# Patient Record
Sex: Female | Born: 1955 | Race: White | Hispanic: No | Marital: Married | Smoking: Former smoker
Health system: Southern US, Community
[De-identification: ages and names within clinical notes are randomized; demographics above are authoritative.]

## PROBLEM LIST (undated history)

## (undated) DIAGNOSIS — R5383 Other fatigue: Secondary | ICD-10-CM

## (undated) DIAGNOSIS — I499 Cardiac arrhythmia, unspecified: Secondary | ICD-10-CM

## (undated) DIAGNOSIS — G4733 Obstructive sleep apnea (adult) (pediatric): Secondary | ICD-10-CM

## (undated) DIAGNOSIS — M1990 Unspecified osteoarthritis, unspecified site: Secondary | ICD-10-CM

## (undated) DIAGNOSIS — M8580 Other specified disorders of bone density and structure, unspecified site: Secondary | ICD-10-CM

## (undated) DIAGNOSIS — J449 Chronic obstructive pulmonary disease, unspecified: Secondary | ICD-10-CM

## (undated) DIAGNOSIS — Z803 Family history of malignant neoplasm of breast: Secondary | ICD-10-CM

## (undated) DIAGNOSIS — Z8051 Family history of malignant neoplasm of kidney: Secondary | ICD-10-CM

## (undated) DIAGNOSIS — I700 Atherosclerosis of aorta: Secondary | ICD-10-CM

## (undated) DIAGNOSIS — F419 Anxiety disorder, unspecified: Secondary | ICD-10-CM

## (undated) DIAGNOSIS — K922 Gastrointestinal hemorrhage, unspecified: Secondary | ICD-10-CM

## (undated) DIAGNOSIS — E785 Hyperlipidemia, unspecified: Secondary | ICD-10-CM

## (undated) DIAGNOSIS — R0989 Other specified symptoms and signs involving the circulatory and respiratory systems: Secondary | ICD-10-CM

## (undated) DIAGNOSIS — I10 Essential (primary) hypertension: Secondary | ICD-10-CM

## (undated) DIAGNOSIS — Z801 Family history of malignant neoplasm of trachea, bronchus and lung: Secondary | ICD-10-CM

## (undated) DIAGNOSIS — Z7901 Long term (current) use of anticoagulants: Secondary | ICD-10-CM

## (undated) DIAGNOSIS — M79606 Pain in leg, unspecified: Secondary | ICD-10-CM

## (undated) DIAGNOSIS — K219 Gastro-esophageal reflux disease without esophagitis: Secondary | ICD-10-CM

## (undated) DIAGNOSIS — M79602 Pain in left arm: Secondary | ICD-10-CM

## (undated) DIAGNOSIS — Z720 Tobacco use: Secondary | ICD-10-CM

## (undated) DIAGNOSIS — S72001A Fracture of unspecified part of neck of right femur, initial encounter for closed fracture: Secondary | ICD-10-CM

## (undated) DIAGNOSIS — K759 Inflammatory liver disease, unspecified: Secondary | ICD-10-CM

## (undated) DIAGNOSIS — G478 Other sleep disorders: Secondary | ICD-10-CM

## (undated) DIAGNOSIS — Z808 Family history of malignant neoplasm of other organs or systems: Secondary | ICD-10-CM

## (undated) DIAGNOSIS — Z9981 Dependence on supplemental oxygen: Secondary | ICD-10-CM

## (undated) HISTORY — DX: Family history of malignant neoplasm of other organs or systems: Z80.8

## (undated) HISTORY — DX: Other fatigue: R53.83

## (undated) HISTORY — DX: Gastrointestinal hemorrhage, unspecified: K92.2

## (undated) HISTORY — DX: Other sleep disorders: G47.8

## (undated) HISTORY — DX: Pain in leg, unspecified: M79.606

## (undated) HISTORY — DX: Family history of malignant neoplasm of breast: Z80.3

## (undated) HISTORY — PX: CHOLECYSTECTOMY: SHX55

## (undated) HISTORY — DX: Family history of malignant neoplasm of trachea, bronchus and lung: Z80.1

## (undated) HISTORY — DX: Fracture of unspecified part of neck of right femur, initial encounter for closed fracture: S72.001A

## (undated) HISTORY — DX: Hyperlipidemia, unspecified: E78.5

## (undated) HISTORY — DX: Inflammatory liver disease, unspecified: K75.9

## (undated) HISTORY — DX: Anxiety disorder, unspecified: F41.9

## (undated) HISTORY — DX: Tobacco use: Z72.0

## (undated) HISTORY — PX: OTHER SURGICAL HISTORY: SHX169

## (undated) HISTORY — DX: Other specified disorders of bone density and structure, unspecified site: M85.80

## (undated) HISTORY — DX: Other specified symptoms and signs involving the circulatory and respiratory systems: R09.89

## (undated) HISTORY — DX: Pain in left arm: M79.602

## (undated) HISTORY — DX: Family history of malignant neoplasm of kidney: Z80.51

---
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
## 2000-04-15 ENCOUNTER — Emergency Department (HOSPITAL_COMMUNITY): Admission: EM | Admit: 2000-04-15 | Discharge: 2000-04-15 | Payer: Self-pay | Admitting: Emergency Medicine

## 2002-12-28 ENCOUNTER — Emergency Department (HOSPITAL_COMMUNITY): Admission: EM | Admit: 2002-12-28 | Discharge: 2002-12-29 | Payer: Self-pay | Admitting: Emergency Medicine

## 2004-04-23 ENCOUNTER — Other Ambulatory Visit: Admission: RE | Admit: 2004-04-23 | Discharge: 2004-04-23 | Payer: Self-pay | Admitting: Obstetrics & Gynecology

## 2004-10-10 ENCOUNTER — Encounter: Admission: RE | Admit: 2004-10-10 | Discharge: 2004-10-10 | Payer: Self-pay | Admitting: General Surgery

## 2004-10-10 ENCOUNTER — Encounter (INDEPENDENT_AMBULATORY_CARE_PROVIDER_SITE_OTHER): Payer: Self-pay | Admitting: *Deleted

## 2005-08-18 HISTORY — PX: MM BREAST STEREO BX*L*R/S: HXRAD495

## 2005-11-16 ENCOUNTER — Emergency Department (HOSPITAL_COMMUNITY): Admission: EM | Admit: 2005-11-16 | Discharge: 2005-11-16 | Payer: Self-pay | Admitting: Emergency Medicine

## 2006-08-18 HISTORY — PX: COLONOSCOPY: SHX174

## 2006-10-17 LAB — CONVERTED CEMR LAB: Pap Smear: NORMAL

## 2006-12-17 LAB — HM COLONOSCOPY: HM Colonoscopy: NORMAL

## 2007-12-02 ENCOUNTER — Ambulatory Visit: Payer: Self-pay | Admitting: Family Medicine

## 2007-12-02 DIAGNOSIS — R5383 Other fatigue: Secondary | ICD-10-CM

## 2007-12-02 DIAGNOSIS — G478 Other sleep disorders: Secondary | ICD-10-CM | POA: Insufficient documentation

## 2007-12-02 DIAGNOSIS — F17200 Nicotine dependence, unspecified, uncomplicated: Secondary | ICD-10-CM | POA: Insufficient documentation

## 2007-12-02 DIAGNOSIS — K5909 Other constipation: Secondary | ICD-10-CM | POA: Insufficient documentation

## 2007-12-02 DIAGNOSIS — E785 Hyperlipidemia, unspecified: Secondary | ICD-10-CM | POA: Insufficient documentation

## 2007-12-02 DIAGNOSIS — F411 Generalized anxiety disorder: Secondary | ICD-10-CM | POA: Insufficient documentation

## 2007-12-02 DIAGNOSIS — R5381 Other malaise: Secondary | ICD-10-CM | POA: Insufficient documentation

## 2007-12-03 LAB — CONVERTED CEMR LAB
AST: 17 units/L (ref 0–37)
Alkaline Phosphatase: 74 units/L (ref 39–117)
Basophils Absolute: 0 10*3/uL (ref 0.0–0.1)
Chloride: 106 meq/L (ref 96–112)
Cholesterol: 199 mg/dL (ref 0–200)
Eosinophils Absolute: 0.3 10*3/uL (ref 0.0–0.7)
GFR calc non Af Amer: 93 mL/min
HDL: 35.1 mg/dL — ABNORMAL LOW (ref >39.0)
MCHC: 34.5 g/dL (ref 30.0–36.0)
MCV: 92.9 fL (ref 78.0–100.0)
Neutrophils Relative %: 55.9 % (ref 43.0–77.0)
Platelets: 330 10*3/uL (ref 150–400)
Potassium: 4.2 meq/L (ref 3.5–5.1)
RDW: 13.4 % (ref 11.5–14.6)
Sodium: 142 meq/L (ref 135–145)
Total Bilirubin: 0.6 mg/dL (ref 0.3–1.2)
VLDL: 38 mg/dL (ref 0–40)
WBC: 7.5 10*3/uL (ref 4.5–10.5)

## 2007-12-17 HISTORY — PX: OTHER SURGICAL HISTORY: SHX169

## 2008-01-05 ENCOUNTER — Ambulatory Visit: Payer: Self-pay | Admitting: Family Medicine

## 2008-01-05 DIAGNOSIS — R0989 Other specified symptoms and signs involving the circulatory and respiratory systems: Secondary | ICD-10-CM | POA: Insufficient documentation

## 2008-01-19 ENCOUNTER — Ambulatory Visit: Payer: Self-pay | Admitting: Internal Medicine

## 2008-01-19 ENCOUNTER — Encounter: Payer: Self-pay | Admitting: Family Medicine

## 2008-04-19 ENCOUNTER — Ambulatory Visit: Payer: Self-pay | Admitting: Family Medicine

## 2008-04-22 LAB — CONVERTED CEMR LAB
ALT: 14 units/L (ref 0–35)
AST: 19 units/L (ref 0–37)
HDL: 35.3 mg/dL — ABNORMAL LOW (ref >39.0)
Triglycerides: 177 mg/dL — ABNORMAL HIGH (ref 0–149)
VLDL: 35 mg/dL (ref 0–40)

## 2008-06-20 ENCOUNTER — Telehealth: Payer: Self-pay | Admitting: Family Medicine

## 2008-09-18 HISTORY — PX: OTHER SURGICAL HISTORY: SHX169

## 2008-09-21 ENCOUNTER — Encounter: Payer: Self-pay | Admitting: Family Medicine

## 2008-09-21 ENCOUNTER — Ambulatory Visit: Payer: Self-pay | Admitting: Family Medicine

## 2008-10-26 ENCOUNTER — Encounter: Admission: RE | Admit: 2008-10-26 | Discharge: 2008-10-26 | Payer: Self-pay | Admitting: Orthopedic Surgery

## 2008-11-14 ENCOUNTER — Telehealth: Payer: Self-pay | Admitting: Family Medicine

## 2009-02-05 ENCOUNTER — Encounter: Payer: Self-pay | Admitting: Family Medicine

## 2009-03-09 ENCOUNTER — Ambulatory Visit (HOSPITAL_BASED_OUTPATIENT_CLINIC_OR_DEPARTMENT_OTHER): Admission: RE | Admit: 2009-03-09 | Discharge: 2009-03-09 | Payer: Self-pay | Admitting: Orthopedic Surgery

## 2009-03-29 ENCOUNTER — Encounter: Payer: Self-pay | Admitting: Family Medicine

## 2009-06-12 ENCOUNTER — Telehealth: Payer: Self-pay | Admitting: Family Medicine

## 2009-07-31 ENCOUNTER — Encounter: Payer: Self-pay | Admitting: Family Medicine

## 2009-08-08 ENCOUNTER — Encounter: Payer: Self-pay | Admitting: Family Medicine

## 2009-09-05 ENCOUNTER — Encounter: Payer: Self-pay | Admitting: Family Medicine

## 2009-12-10 ENCOUNTER — Telehealth: Payer: Self-pay | Admitting: Family Medicine

## 2010-01-18 ENCOUNTER — Encounter: Payer: Self-pay | Admitting: Family Medicine

## 2010-04-16 ENCOUNTER — Encounter: Payer: Self-pay | Admitting: Family Medicine

## 2010-09-17 NOTE — Letter (Signed)
Summary: [facility]  [facility]   Imported By: Lanelle Bal 04/24/2010 14:15:20  _____________________________________________________________________  External Attachment:    Type:   Image     Comment:   External Document

## 2010-09-17 NOTE — Progress Notes (Signed)
Summary: Rx Alprazolam  Phone Note Refill Request Call back at [phone] Message from:  [facility] on December 10, 2009 10:36 AM  Refills Requested: Medication #1:  ALPRAZOLAM 1 MG TABS 1 by mouth at bedtime as needed insomnia.   Last Refilled: 11/09/2009 Received faxed refill request please advise.   Method Requested: Telephone to Pharmacy Initial call taken by: Linde Gillis CMA Duncan Dull),  December 10, 2009 10:36 AM  Follow-up for Phone Call        px written on EMR for call in schedule check up with me this summer if possible   Follow-up by: Judith Part MD,  December 10, 2009 12:50 PM  Additional Follow-up for Phone Call Additional follow up Details #1::        Medication phoned to pharmacy. Patient Advised.  Additional Follow-up by: Delilah Shan CMA Duncan Dull),  December 10, 2009 3:19 PM    New/Updated Medications: ALPRAZOLAM 1 MG TABS (ALPRAZOLAM) 1 by mouth at bedtime as needed insomnia Prescriptions: ALPRAZOLAM 1 MG TABS (ALPRAZOLAM) 1 by mouth at bedtime as needed insomnia  #30 x 5   Entered and Authorized by:   Judith Part MD   Signed by:   Delilah Shan CMA (AAMA) on 12/10/2009   Method used:   Telephoned to ...         RxID:   [id number]

## 2010-09-17 NOTE — Letter (Signed)
Summary: [facility]  [facility]   Imported By: Lanelle Bal 01/29/2010 10:42:36  _____________________________________________________________________  External Attachment:    Type:   Image     Comment:   External Document

## 2010-09-17 NOTE — Letter (Signed)
Summary: [facility]  [facility]   Imported By: Lanelle Bal 09/11/2009 13:02:15  _____________________________________________________________________  External Attachment:    Type:   Image     Comment:   External Document

## 2010-12-15 ENCOUNTER — Emergency Department: Payer: Self-pay | Admitting: Emergency Medicine

## 2010-12-31 NOTE — Op Note (Signed)
Debra [name], Debra [name]                  ACCOUNT NO.:  [id number]   MEDICAL RECORD NO.:  [id number]          PATIENT TYPE:  AMB   LOCATION:  DSC                          FACILITY:  [facility]   PHYSICIAN:  Harvie Junior, M.D.   DATE OF BIRTH:  28-Nov-1955   DATE OF PROCEDURE:  03/09/2009  DATE OF DISCHARGE:                               OPERATIVE REPORT   PREOPERATIVE DIAGNOSES:  1. Impingement.  2. Acromioclavicular joint arthritis.  3. Partial-thickness rotator cuff tear.   POSTOPERATIVE DIAGNOSES:  1. Impingement.  2. Acromioclavicular joint arthritis.  3. Partial-thickness rotator cuff tear.   PRINCIPAL PROCEDURE:  1. Left shoulder arthroscopy with subacromial decompression.  2. Distal clavicle excision.  3. Debridement of undersurface of rotator cuff tear and anterior      labral tear.   SURGEON:  Harvie Junior, MD   ASSISTANT:  Marshia Ly, PA   ANESTHESIA:  General.   BRIEF HISTORY:  This is a 55 year old female with long history of having  had significant left shoulder pain.  She was treated for a prolonged  period of time.  Ultimately, MRI showed that she had a partial thickness  rotator cuff tear.  We felt she had a lot of symptoms coming from her  neck and ultimately she was treated by neurosurgeons who felt that her  symptoms were not coming from her neck and she came back to [place] for the  shoulder.  She improved significantly with an injection because of  continued complaints of pain.  After failure of conservative management,  she is taken to the operating room for operative shoulder arthroscopy.   PROCEDURE:  The patient was taken to the operating room.  After adequate  anesthesia was obtained under general anesthetic.  The patient was  placed supine on the operating room and moved in the beach-chair  position.  All bony prominences were well padded.  Attention was then  turned to the left shoulder.  After routine prep and drape, the  arthroscope was entered into  the left shoulder and there was obvious  anterior labral tear which was debrided.  The rotator cuff had a partial-  thickness undersurface tear.  This was identified by MRI.  This was  debrided thoroughly from the undersurface and marked with a suture.  The  biceps tendon was within normal limits.  Glenohumeral joint, no  arthritis.  Attention was turned out of the glenohumeral joint into the  subacromial space.  A suture was identified and I looked at the rotator  cuff from the top side.  No significant fraying on the top side.  At  this point, I felt the rotator cuff was fine and suture was removed.  Anterolateral acromioplasty was performed for lateral posterior  compartment.  Distal clavicle resected was performed over 18 mm from the  anterior compartment.  A subtotal  bursectomy was then performed and the wound was copiously and thoroughly  irrigated and suctioned dry.  All sterile portals were then closed with  bandage.  Sterile compressive dressing was applied, and the patient was  to recovery  room and noted to be in satisfactory condition.  Estimated  blood loss for procedure was none.      Harvie Junior, M.D.  Electronically Signed     JLG/MEDQ  D:  03/09/2009  T:  03/10/2009  Job:  [id number]

## 2011-02-03 ENCOUNTER — Encounter: Payer: Self-pay | Admitting: Cardiovascular Disease

## 2011-10-24 ENCOUNTER — Encounter: Payer: Self-pay | Admitting: Family Medicine

## 2011-10-24 ENCOUNTER — Telehealth: Payer: Self-pay | Admitting: Family Medicine

## 2011-10-24 ENCOUNTER — Ambulatory Visit (INDEPENDENT_AMBULATORY_CARE_PROVIDER_SITE_OTHER): Payer: Medicare Other | Admitting: Family Medicine

## 2011-10-24 VITALS — BP 120/80 | HR 84 | Temp 98.4°F | Wt 178.0 lb

## 2011-10-24 DIAGNOSIS — J069 Acute upper respiratory infection, unspecified: Secondary | ICD-10-CM

## 2011-10-24 MED ORDER — HYDROCODONE-HOMATROPINE 5-1.5 MG/5ML PO SYRP: 5.0000 mL | ORAL_SOLUTION | Freq: Three times a day (TID) | ORAL | Status: DC | PRN

## 2011-10-24 MED ORDER — HYDROCOD POLST-CHLORPHEN POLST 10-8 MG/5ML PO LQCR: 5.0000 mL | Freq: Every evening | ORAL | Status: DC | PRN
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
## 2011-10-24 NOTE — Telephone Encounter (Signed)
Medicine called to pharmacy. 

## 2011-10-24 NOTE — Patient Instructions (Signed)
Good to see you. This is likely a virus.   Drink lots of fluids.  Treat sympotmatically with Mucinex, nasal saline irrigation, and Tylenol/Ibuprofen.  Cough suppressant at night. Call if not improving as expected in 5-7 days.

## 2011-11-03 ENCOUNTER — Ambulatory Visit (INDEPENDENT_AMBULATORY_CARE_PROVIDER_SITE_OTHER): Payer: Medicare Other | Admitting: Family Medicine

## 2011-11-03 ENCOUNTER — Encounter: Payer: Self-pay | Admitting: Family Medicine

## 2011-11-03 VITALS — BP 120/84 | HR 84 | Temp 98.7°F | Wt 177.0 lb

## 2011-11-03 DIAGNOSIS — J069 Acute upper respiratory infection, unspecified: Secondary | ICD-10-CM

## 2011-11-03 MED ORDER — AZITHROMYCIN 250 MG PO TABS: ORAL_TABLET | ORAL | Status: AC

## 2011-11-03 NOTE — Patient Instructions (Signed)
Good to see you. Take Zpack as directed, continue using mucinex and cough suppressant as needed.
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
## 2011-12-03 ENCOUNTER — Telehealth: Payer: Self-pay

## 2011-12-03 NOTE — Telephone Encounter (Signed)
Aware of that in the past - she has a bruit F/u in fall as planned

## 2011-12-03 NOTE — Telephone Encounter (Signed)
Eunice Blase and Rielle advised as instructed via telephone, CPX scheduled for patient 04/21/2012 at 11:30.

## 2011-12-03 NOTE — Telephone Encounter (Signed)
Debbie NP  with [facility] saw pt and noticed sound in left neck; not sure if referred murmur sound or bruit. Pt said no one had mentioned before. Pt having no problems, asymptomatic and pt to schedule f/u appt in Oct with Dr Milinda Antis unless Dr Milinda Antis would like to see pt earlier. Please respond to pt and not Debbie. Pt can be reached at [phone].

## 2011-12-18 ENCOUNTER — Telehealth: Payer: Self-pay | Admitting: Family Medicine

## 2011-12-18 NOTE — Telephone Encounter (Signed)
Caller: Debra [name]/Patient; PCP: Tower, Marne A.; CB#: [phone]; ; ; Call regarding Cough/Congestion;  Was seen in office "1st or 2nd week in April" and diagnosed with bronchitis. Was given Z Pack. Has developed cough 4-28 and wants additional medicine called. Is non productive cough. Afebrile. Appointment scheduled for 5-3 at 1015.

## 2011-12-18 NOTE — Telephone Encounter (Signed)
Will see her at that appt

## 2011-12-19 ENCOUNTER — Ambulatory Visit (INDEPENDENT_AMBULATORY_CARE_PROVIDER_SITE_OTHER): Payer: Medicare Other | Admitting: Family Medicine

## 2011-12-19 ENCOUNTER — Encounter: Payer: Self-pay | Admitting: Family Medicine

## 2011-12-19 VITALS — BP 102/70 | HR 90 | Temp 98.1°F | Ht 65.0 in | Wt 178.5 lb

## 2011-12-19 DIAGNOSIS — F17200 Nicotine dependence, unspecified, uncomplicated: Secondary | ICD-10-CM

## 2011-12-19 DIAGNOSIS — R0989 Other specified symptoms and signs involving the circulatory and respiratory systems: Secondary | ICD-10-CM

## 2011-12-19 DIAGNOSIS — J209 Acute bronchitis, unspecified: Secondary | ICD-10-CM

## 2011-12-19 DIAGNOSIS — Z87891 Personal history of nicotine dependence: Secondary | ICD-10-CM | POA: Insufficient documentation

## 2011-12-19 MED ORDER — AZITHROMYCIN 250 MG PO TABS: ORAL_TABLET | ORAL | Status: AC
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
## 2011-12-19 NOTE — Patient Instructions (Signed)
Take the zithromax for bronchitis Use mucinex DM for cough over the counter Drink lots of fluids and get rest  Work on quitting smoking asap!  We will schedule carotid doppler at check out

## 2011-12-19 NOTE — Assessment & Plan Note (Signed)
This is lounder, but asymptomatic Ref for carotid dopplers Check up in fall sched On asa qd inst to quit smoking

## 2011-12-19 NOTE — Assessment & Plan Note (Signed)
Disc in detail risks of smoking and possible outcomes including copd, vascular/ heart disease, cancer , respiratory and sinus infections  Pt voices understanding  Pt cutting down but not ready to quit

## 2011-12-19 NOTE — Assessment & Plan Note (Signed)
In smoker/ mild reactive airways- persistent for 1 mo and prod  zpak px  Disc symptomatic care - see instructions on AVS  Update if not starting to improve in a week or if worsening   Will try to quit smoking

## 2011-12-22 ENCOUNTER — Other Ambulatory Visit: Payer: Self-pay | Admitting: Cardiology

## 2011-12-22 DIAGNOSIS — R0989 Other specified symptoms and signs involving the circulatory and respiratory systems: Secondary | ICD-10-CM

## 2011-12-29 ENCOUNTER — Encounter (INDEPENDENT_AMBULATORY_CARE_PROVIDER_SITE_OTHER): Payer: Medicare Other

## 2011-12-29 DIAGNOSIS — I6529 Occlusion and stenosis of unspecified carotid artery: Secondary | ICD-10-CM

## 2011-12-29 DIAGNOSIS — R0989 Other specified symptoms and signs involving the circulatory and respiratory systems: Secondary | ICD-10-CM

## 2011-12-31 ENCOUNTER — Encounter: Payer: Self-pay | Admitting: Family Medicine

## 2012-01-28 ENCOUNTER — Ambulatory Visit (INDEPENDENT_AMBULATORY_CARE_PROVIDER_SITE_OTHER): Payer: Medicare Other | Admitting: Family Medicine

## 2012-01-28 ENCOUNTER — Ambulatory Visit (INDEPENDENT_AMBULATORY_CARE_PROVIDER_SITE_OTHER)
Admission: RE | Admit: 2012-01-28 | Discharge: 2012-01-28 | Disposition: A | Discharge status: Home / Self Care | Payer: Medicare Other | Source: Ambulatory Visit | Attending: Family Medicine | Admitting: Family Medicine

## 2012-01-28 ENCOUNTER — Encounter: Payer: Self-pay | Admitting: Family Medicine

## 2012-01-28 VITALS — BP 106/70 | HR 104 | Temp 98.4°F | Ht 65.0 in | Wt 178.5 lb

## 2012-01-28 DIAGNOSIS — J209 Acute bronchitis, unspecified: Secondary | ICD-10-CM

## 2012-01-28 DIAGNOSIS — Z87891 Personal history of nicotine dependence: Secondary | ICD-10-CM

## 2012-01-28 DIAGNOSIS — B9689 Other specified bacterial agents as the cause of diseases classified elsewhere: Secondary | ICD-10-CM

## 2012-01-28 DIAGNOSIS — J208 Acute bronchitis due to other specified organisms: Secondary | ICD-10-CM | POA: Insufficient documentation

## 2012-01-28 MED ORDER — GUAIFENESIN-CODEINE 100-10 MG/5ML PO SYRP: 5.0000 mL | ORAL_SOLUTION | Freq: Four times a day (QID) | ORAL | Status: AC | PRN

## 2012-01-28 MED ORDER — AZITHROMYCIN 250 MG PO TABS: ORAL_TABLET | ORAL | Status: AC

## 2012-01-28 NOTE — Assessment & Plan Note (Signed)
Congratulated on quitting  Very proud-pt doing well
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
## 2012-01-28 NOTE — Patient Instructions (Addendum)
Take the zithromax Try the robitussin with codeine if you can afford it - will sedate you, so use caution  Drink lots of fluids and try to get rest  Chest xray today - will call with results

## 2012-01-28 NOTE — Assessment & Plan Note (Addendum)
Unsure if new or recurrent from last bout  - has had many uris this year Thankfully did quit smoking  Cover with zpak cxr- no acute changes , chronic changes from smoking/ copd seen with hyperinflation  Robitussin ac if affordible  Fluids/ rest  Update if not starting to improve in a week or if worsening

## 2012-01-30 ENCOUNTER — Encounter: Payer: Self-pay | Admitting: Family Medicine

## 2012-04-21 ENCOUNTER — Encounter: Payer: Medicare Other | Admitting: Family Medicine

## 2012-04-21 ENCOUNTER — Ambulatory Visit: Payer: Medicare Other | Admitting: Family Medicine

## 2012-05-03 ENCOUNTER — Encounter: Payer: Medicare Other | Admitting: Family Medicine

## 2012-09-06 ENCOUNTER — Inpatient Hospital Stay: Payer: Self-pay | Admitting: Internal Medicine

## 2012-09-06 LAB — CBC
HCT: 41.1 % (ref 35.0–47.0)
HGB: 13.8 g/dL (ref 12.0–16.0)
MCV: 92 fL (ref 80–100)
Platelet: 232 10*3/uL (ref 150–440)
RDW: 13.6 % (ref 11.5–14.5)

## 2012-09-06 LAB — COMPREHENSIVE METABOLIC PANEL
Albumin: 3.3 g/dL — ABNORMAL LOW (ref 3.4–5.0)
Alkaline Phosphatase: 94 U/L (ref 50–136)
Anion Gap: 5 — ABNORMAL LOW (ref 7–16)
Calcium, Total: 9 mg/dL (ref 8.5–10.1)
Co2: 27 mmol/L (ref 21–32)
Creatinine: 0.58 mg/dL — ABNORMAL LOW (ref 0.60–1.30)
EGFR (African American): >60
EGFR (Non-African Amer.): >60
Osmolality: 271 (ref 275–301)
Sodium: 136 mmol/L (ref 136–145)
Total Protein: 7.4 g/dL (ref 6.4–8.2)

## 2012-09-06 LAB — URINALYSIS, COMPLETE
Bilirubin,UR: NEGATIVE
Glucose,UR: NEGATIVE mg/dL (ref 0–75)
Nitrite: NEGATIVE
Ph: 6 (ref 4.5–8.0)
Protein: 30
RBC,UR: 57 /HPF (ref 0–5)
Specific Gravity: 1.024 (ref 1.003–1.030)
Squamous Epithelial: 2

## 2012-09-07 LAB — CBC WITH DIFFERENTIAL/PLATELET
Basophil #: 0 10*3/uL (ref 0.0–0.1)
Eosinophil #: 0 10*3/uL (ref 0.0–0.7)
Eosinophil %: 0.3 %
HGB: 11.9 g/dL — ABNORMAL LOW (ref 12.0–16.0)
Lymphocyte %: 15.2 %
MCHC: 34.4 g/dL (ref 32.0–36.0)
Monocyte #: 0.6 x10 3/mm (ref 0.2–0.9)
Neutrophil #: 8 10*3/uL — ABNORMAL HIGH (ref 1.4–6.5)
RBC: 3.76 10*6/uL — ABNORMAL LOW (ref 3.80–5.20)
RDW: 14.1 % (ref 11.5–14.5)

## 2012-09-07 LAB — BASIC METABOLIC PANEL
Co2: 24 mmol/L (ref 21–32)
Glucose: 94 mg/dL (ref 65–99)
Potassium: 3.6 mmol/L (ref 3.5–5.1)

## 2012-09-07 LAB — MAGNESIUM: Magnesium: 1.8 mg/dL

## 2012-09-08 LAB — URINE CULTURE

## 2012-09-11 LAB — CULTURE, BLOOD (SINGLE)

## 2012-09-27 ENCOUNTER — Encounter: Payer: Medicare Other | Admitting: Family Medicine

## 2012-10-20 ENCOUNTER — Other Ambulatory Visit (HOSPITAL_COMMUNITY): Payer: Self-pay | Admitting: Nurse Practitioner

## 2012-10-20 DIAGNOSIS — Z1231 Encounter for screening mammogram for malignant neoplasm of breast: Secondary | ICD-10-CM

## 2012-10-28 ENCOUNTER — Ambulatory Visit (HOSPITAL_COMMUNITY)
Admission: RE | Admit: 2012-10-28 | Discharge: 2012-10-28 | Disposition: A | Discharge status: Home / Self Care | Payer: Medicare Other | Source: Ambulatory Visit | Attending: Nurse Practitioner | Admitting: Nurse Practitioner

## 2012-10-28 DIAGNOSIS — Z1231 Encounter for screening mammogram for malignant neoplasm of breast: Secondary | ICD-10-CM | POA: Insufficient documentation

## 2012-12-28 ENCOUNTER — Other Ambulatory Visit: Payer: Self-pay | Admitting: Pain Medicine

## 2012-12-28 DIAGNOSIS — M545 Low back pain, unspecified: Secondary | ICD-10-CM

## 2013-01-01 ENCOUNTER — Ambulatory Visit
Admission: RE | Admit: 2013-01-01 | Discharge: 2013-01-01 | Disposition: A | Discharge status: Home / Self Care | Payer: Medicare Other | Source: Ambulatory Visit | Attending: Pain Medicine | Admitting: Pain Medicine

## 2013-01-01 DIAGNOSIS — M545 Low back pain, unspecified: Secondary | ICD-10-CM

## 2013-01-03 ENCOUNTER — Other Ambulatory Visit: Payer: Medicare Other

## 2013-04-14 ENCOUNTER — Ambulatory Visit
Admission: RE | Admit: 2013-04-14 | Discharge: 2013-04-14 | Disposition: A | Discharge status: Home / Self Care | Payer: Medicare Other | Source: Ambulatory Visit | Attending: Pain Medicine | Admitting: Pain Medicine

## 2013-04-14 ENCOUNTER — Other Ambulatory Visit: Payer: Self-pay | Admitting: Pain Medicine

## 2013-04-14 DIAGNOSIS — M25561 Pain in right knee: Secondary | ICD-10-CM

## 2013-06-03 ENCOUNTER — Other Ambulatory Visit: Payer: Self-pay | Admitting: Pain Medicine

## 2013-06-03 DIAGNOSIS — M549 Dorsalgia, unspecified: Secondary | ICD-10-CM

## 2013-06-11 ENCOUNTER — Ambulatory Visit
Admission: RE | Admit: 2013-06-11 | Discharge: 2013-06-11 | Disposition: A | Discharge status: Home / Self Care | Payer: Medicare Other | Source: Ambulatory Visit | Attending: Pain Medicine | Admitting: Pain Medicine

## 2013-06-11 DIAGNOSIS — M549 Dorsalgia, unspecified: Secondary | ICD-10-CM

## 2013-08-18 DIAGNOSIS — S72001A Fracture of unspecified part of neck of right femur, initial encounter for closed fracture: Secondary | ICD-10-CM

## 2013-08-18 HISTORY — DX: Fracture of unspecified part of neck of right femur, initial encounter for closed fracture: S72.001A

## 2013-12-22 ENCOUNTER — Other Ambulatory Visit (HOSPITAL_COMMUNITY): Payer: Self-pay | Admitting: Nurse Practitioner

## 2013-12-22 DIAGNOSIS — Z1231 Encounter for screening mammogram for malignant neoplasm of breast: Secondary | ICD-10-CM

## 2014-01-04 ENCOUNTER — Ambulatory Visit (HOSPITAL_COMMUNITY)
Admission: RE | Admit: 2014-01-04 | Discharge: 2014-01-04 | Disposition: A | Discharge status: Home / Self Care | Payer: Medicare Other | Source: Ambulatory Visit | Attending: Nurse Practitioner | Admitting: Nurse Practitioner

## 2014-01-04 DIAGNOSIS — Z1231 Encounter for screening mammogram for malignant neoplasm of breast: Secondary | ICD-10-CM | POA: Insufficient documentation

## 2014-03-28 ENCOUNTER — Ambulatory Visit
Admission: RE | Admit: 2014-03-28 | Discharge: 2014-03-28 | Disposition: A | Discharge status: Home / Self Care | Payer: Medicare Other | Source: Ambulatory Visit | Attending: Physician Assistant | Admitting: Physician Assistant

## 2014-03-28 ENCOUNTER — Other Ambulatory Visit: Payer: Self-pay | Admitting: Physician Assistant

## 2014-03-28 DIAGNOSIS — R0789 Other chest pain: Secondary | ICD-10-CM

## 2014-04-10 ENCOUNTER — Inpatient Hospital Stay: Payer: Self-pay | Admitting: Internal Medicine

## 2014-04-10 LAB — COMPREHENSIVE METABOLIC PANEL
ALBUMIN: 3.8 g/dL (ref 3.4–5.0)
AST: 14 U/L — AB (ref 15–37)
Alkaline Phosphatase: 77 U/L
Anion Gap: 5 — ABNORMAL LOW (ref 7–16)
BUN: 8 mg/dL (ref 7–18)
Bilirubin,Total: 0.3 mg/dL (ref 0.2–1.0)
CHLORIDE: 101 mmol/L (ref 98–107)
CO2: 35 mmol/L — AB (ref 21–32)
Calcium, Total: 9.6 mg/dL (ref 8.5–10.1)
Creatinine: 0.74 mg/dL (ref 0.60–1.30)
EGFR (Non-African Amer.): >60
GLUCOSE: 105 mg/dL — AB (ref 65–99)
Osmolality: 280 (ref 275–301)
Potassium: 3.9 mmol/L (ref 3.5–5.1)
SGPT (ALT): 17 U/L
Sodium: 141 mmol/L (ref 136–145)
TOTAL PROTEIN: 7.8 g/dL (ref 6.4–8.2)

## 2014-04-10 LAB — PRO B NATRIURETIC PEPTIDE: B-Type Natriuretic Peptide: 913 pg/mL — ABNORMAL HIGH (ref 0–125)

## 2014-04-10 LAB — CBC
HCT: 49.3 % — AB (ref 35.0–47.0)
HGB: 15.7 g/dL (ref 12.0–16.0)
MCH: 30.1 pg (ref 26.0–34.0)
MCHC: 31.9 g/dL — ABNORMAL LOW (ref 32.0–36.0)
MCV: 94 fL (ref 80–100)
PLATELETS: 269 10*3/uL (ref 150–440)
RBC: 5.23 10*6/uL — ABNORMAL HIGH (ref 3.80–5.20)
RDW: 15.6 % — ABNORMAL HIGH (ref 11.5–14.5)
WBC: 10 10*3/uL (ref 3.6–11.0)

## 2014-04-10 LAB — CK TOTAL AND CKMB (NOT AT ARMC)
CK, TOTAL: 40 U/L
CK-MB: 2 ng/mL (ref 0.5–3.6)

## 2014-04-10 LAB — TROPONIN I: Troponin-I: 0.02 ng/mL

## 2014-04-11 LAB — BASIC METABOLIC PANEL
Anion Gap: 4 — ABNORMAL LOW (ref 7–16)
BUN: 12 mg/dL (ref 7–18)
Calcium, Total: 8.8 mg/dL (ref 8.5–10.1)
Chloride: 100 mmol/L (ref 98–107)
Co2: 36 mmol/L — ABNORMAL HIGH (ref 21–32)
Creatinine: 0.62 mg/dL (ref 0.60–1.30)
EGFR (African American): >60
EGFR (Non-African Amer.): >60
Glucose: 136 mg/dL — ABNORMAL HIGH (ref 65–99)
OSMOLALITY: 281 (ref 275–301)
POTASSIUM: 4.1 mmol/L (ref 3.5–5.1)
SODIUM: 140 mmol/L (ref 136–145)

## 2014-04-11 LAB — CBC WITH DIFFERENTIAL/PLATELET
BASOS ABS: 0 10*3/uL (ref 0.0–0.1)
Basophil %: 0.1 %
Eosinophil #: 0 10*3/uL (ref 0.0–0.7)
Eosinophil %: 0.1 %
HCT: 42 % (ref 35.0–47.0)
HGB: 14.2 g/dL (ref 12.0–16.0)
LYMPHS PCT: 8.9 %
Lymphocyte #: 0.9 10*3/uL — ABNORMAL LOW (ref 1.0–3.6)
MCH: 31.1 pg (ref 26.0–34.0)
MCHC: 33.7 g/dL (ref 32.0–36.0)
MCV: 92 fL (ref 80–100)
MONOS PCT: 1.9 %
Monocyte #: 0.2 x10 3/mm (ref 0.2–0.9)
NEUTROS ABS: 8.7 10*3/uL — AB (ref 1.4–6.5)
Neutrophil %: 89 %
Platelet: 222 10*3/uL (ref 150–440)
RBC: 4.54 10*6/uL (ref 3.80–5.20)
RDW: 15.1 % — ABNORMAL HIGH (ref 11.5–14.5)
WBC: 9.7 10*3/uL (ref 3.6–11.0)

## 2014-04-15 LAB — CULTURE, BLOOD (SINGLE)

## 2014-04-25 ENCOUNTER — Inpatient Hospital Stay: Payer: Self-pay | Admitting: Specialist

## 2014-04-25 ENCOUNTER — Encounter (HOSPITAL_COMMUNITY): Payer: Self-pay | Admitting: Emergency Medicine

## 2014-04-25 ENCOUNTER — Emergency Department (HOSPITAL_COMMUNITY)
Admission: EM | Admit: 2014-04-25 | Discharge: 2014-04-25 | Discharge status: Against Medical Advice | Payer: Medicare Other | Attending: Emergency Medicine | Admitting: Emergency Medicine

## 2014-04-25 DIAGNOSIS — Y929 Unspecified place or not applicable: Secondary | ICD-10-CM | POA: Diagnosis not present

## 2014-04-25 DIAGNOSIS — R296 Repeated falls: Secondary | ICD-10-CM | POA: Insufficient documentation

## 2014-04-25 DIAGNOSIS — S99919A Unspecified injury of unspecified ankle, initial encounter: Principal | ICD-10-CM

## 2014-04-25 DIAGNOSIS — Y939 Activity, unspecified: Secondary | ICD-10-CM | POA: Insufficient documentation

## 2014-04-25 DIAGNOSIS — S8990XA Unspecified injury of unspecified lower leg, initial encounter: Secondary | ICD-10-CM | POA: Insufficient documentation

## 2014-04-25 DIAGNOSIS — S99929A Unspecified injury of unspecified foot, initial encounter: Principal | ICD-10-CM

## 2014-04-25 LAB — CBC WITH DIFFERENTIAL/PLATELET
BASOS ABS: 0.1 10*3/uL (ref 0.0–0.1)
Basophil %: 0.6 %
EOS ABS: 0.2 10*3/uL (ref 0.0–0.7)
Eosinophil %: 1.8 %
HCT: 37.8 % (ref 35.0–47.0)
HGB: 12 g/dL (ref 12.0–16.0)
LYMPHS PCT: 10.1 %
Lymphocyte #: 1.4 10*3/uL (ref 1.0–3.6)
MCH: 29.7 pg (ref 26.0–34.0)
MCHC: 31.7 g/dL — ABNORMAL LOW (ref 32.0–36.0)
MCV: 94 fL (ref 80–100)
MONO ABS: 0.7 x10 3/mm (ref 0.2–0.9)
Monocyte %: 5.2 %
NEUTROS ABS: 11.3 10*3/uL — AB (ref 1.4–6.5)
Neutrophil %: 82.3 %
PLATELETS: 250 10*3/uL (ref 150–440)
RBC: 4.04 10*6/uL (ref 3.80–5.20)
RDW: 15.5 % — ABNORMAL HIGH (ref 11.5–14.5)
WBC: 13.7 10*3/uL — AB (ref 3.6–11.0)

## 2014-04-25 LAB — COMPREHENSIVE METABOLIC PANEL
ALBUMIN: 2.8 g/dL — AB (ref 3.4–5.0)
ALK PHOS: 63 U/L
Anion Gap: 3 — ABNORMAL LOW (ref 7–16)
BILIRUBIN TOTAL: 0.4 mg/dL (ref 0.2–1.0)
BUN: 11 mg/dL (ref 7–18)
CHLORIDE: 105 mmol/L (ref 98–107)
CO2: 34 mmol/L — AB (ref 21–32)
Calcium, Total: 8.9 mg/dL (ref 8.5–10.1)
Creatinine: 0.56 mg/dL — ABNORMAL LOW (ref 0.60–1.30)
EGFR (Non-African Amer.): >60
Glucose: 91 mg/dL (ref 65–99)
OSMOLALITY: 282 (ref 275–301)
POTASSIUM: 4.3 mmol/L (ref 3.5–5.1)
SGOT(AST): 42 U/L — ABNORMAL HIGH (ref 15–37)
SGPT (ALT): 48 U/L
SODIUM: 142 mmol/L (ref 136–145)
Total Protein: 6.3 g/dL — ABNORMAL LOW (ref 6.4–8.2)

## 2014-04-25 LAB — PROTIME-INR
INR: 0.9
Prothrombin Time: 11.8 secs (ref 11.5–14.7)

## 2014-04-25 NOTE — ED Notes (Addendum)
Called the patient three time to do a recheck of vitals and no answer.  I also found her BP and pager 30 up front at the nurse first desk.

## 2014-04-25 NOTE — ED Notes (Signed)
Called the patient three time to do a recheck of vitals and no answer.  I also found her BP and pager 30 up front at the nurse first desk.

## 2014-04-25 NOTE — ED Notes (Signed)
Pt reports chronic bilateral leg pain, states "they have been giving out on me recently" Pt reports 2 falls in last 48 hours. Denies injury, denies LOC, denies any dizziness/lightheadedness prior to fall. States she is out of pain medication as well which "is part of the reason I have been falling."" Pt AO x 4. Neuro intact.

## 2014-04-26 HISTORY — PX: OTHER SURGICAL HISTORY: SHX169

## 2014-04-27 LAB — BASIC METABOLIC PANEL
Anion Gap: 7 (ref 7–16)
BUN: 8 mg/dL (ref 7–18)
CHLORIDE: 103 mmol/L (ref 98–107)
Calcium, Total: 8.4 mg/dL — ABNORMAL LOW (ref 8.5–10.1)
Co2: 28 mmol/L (ref 21–32)
Creatinine: 0.53 mg/dL — ABNORMAL LOW (ref 0.60–1.30)
EGFR (African American): >60
EGFR (Non-African Amer.): >60
Glucose: 95 mg/dL (ref 65–99)
Osmolality: 274 (ref 275–301)
Potassium: 3.7 mmol/L (ref 3.5–5.1)
Sodium: 138 mmol/L (ref 136–145)

## 2014-04-27 LAB — CBC WITH DIFFERENTIAL/PLATELET
BASOS ABS: 0 10*3/uL (ref 0.0–0.1)
Basophil %: 0.4 %
EOS ABS: 0.5 10*3/uL (ref 0.0–0.7)
Eosinophil %: 4.4 %
HCT: 36.8 % (ref 35.0–47.0)
HGB: 11.9 g/dL — AB (ref 12.0–16.0)
Lymphocyte #: 1.5 10*3/uL (ref 1.0–3.6)
Lymphocyte %: 12.7 %
MCH: 30 pg (ref 26.0–34.0)
MCHC: 32.3 g/dL (ref 32.0–36.0)
MCV: 93 fL (ref 80–100)
MONOS PCT: 4.9 %
Monocyte #: 0.6 x10 3/mm (ref 0.2–0.9)
NEUTROS PCT: 77.6 %
Neutrophil #: 9.1 10*3/uL — ABNORMAL HIGH (ref 1.4–6.5)
Platelet: 188 10*3/uL (ref 150–440)
RBC: 3.96 10*6/uL (ref 3.80–5.20)
RDW: 15.3 % — ABNORMAL HIGH (ref 11.5–14.5)
WBC: 11.7 10*3/uL — AB (ref 3.6–11.0)

## 2014-05-01 ENCOUNTER — Non-Acute Institutional Stay (SKILLED_NURSING_FACILITY): Payer: Medicare Other | Admitting: Adult Health

## 2014-05-01 DIAGNOSIS — J449 Chronic obstructive pulmonary disease, unspecified: Secondary | ICD-10-CM

## 2014-05-01 DIAGNOSIS — S72009S Fracture of unspecified part of neck of unspecified femur, sequela: Secondary | ICD-10-CM

## 2014-05-01 DIAGNOSIS — F329 Major depressive disorder, single episode, unspecified: Secondary | ICD-10-CM

## 2014-05-01 DIAGNOSIS — S72001S Fracture of unspecified part of neck of right femur, sequela: Secondary | ICD-10-CM

## 2014-05-01 DIAGNOSIS — F419 Anxiety disorder, unspecified: Secondary | ICD-10-CM

## 2014-05-01 DIAGNOSIS — F32A Depression, unspecified: Secondary | ICD-10-CM

## 2014-05-01 DIAGNOSIS — F341 Dysthymic disorder: Secondary | ICD-10-CM

## 2014-05-03 ENCOUNTER — Encounter: Payer: Self-pay | Admitting: Adult Health

## 2014-05-03 DIAGNOSIS — F32A Depression, unspecified: Secondary | ICD-10-CM | POA: Insufficient documentation

## 2014-05-03 DIAGNOSIS — J4489 Other specified chronic obstructive pulmonary disease: Secondary | ICD-10-CM | POA: Insufficient documentation

## 2014-05-03 DIAGNOSIS — F329 Major depressive disorder, single episode, unspecified: Secondary | ICD-10-CM | POA: Insufficient documentation

## 2014-05-03 DIAGNOSIS — J449 Chronic obstructive pulmonary disease, unspecified: Secondary | ICD-10-CM | POA: Insufficient documentation

## 2014-05-03 DIAGNOSIS — F419 Anxiety disorder, unspecified: Secondary | ICD-10-CM | POA: Insufficient documentation

## 2014-05-03 DIAGNOSIS — S72001A Fracture of unspecified part of neck of right femur, initial encounter for closed fracture: Secondary | ICD-10-CM | POA: Insufficient documentation
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
## 2014-05-04 ENCOUNTER — Non-Acute Institutional Stay (SKILLED_NURSING_FACILITY): Payer: Medicare Other | Admitting: Adult Health

## 2014-05-04 DIAGNOSIS — F419 Anxiety disorder, unspecified: Secondary | ICD-10-CM

## 2014-05-04 DIAGNOSIS — S72009S Fracture of unspecified part of neck of unspecified femur, sequela: Secondary | ICD-10-CM

## 2014-05-04 DIAGNOSIS — F341 Dysthymic disorder: Secondary | ICD-10-CM

## 2014-05-04 DIAGNOSIS — J449 Chronic obstructive pulmonary disease, unspecified: Secondary | ICD-10-CM

## 2014-05-04 DIAGNOSIS — S72001S Fracture of unspecified part of neck of right femur, sequela: Secondary | ICD-10-CM

## 2014-05-04 DIAGNOSIS — F32A Depression, unspecified: Secondary | ICD-10-CM

## 2014-05-04 DIAGNOSIS — F329 Major depressive disorder, single episode, unspecified: Secondary | ICD-10-CM

## 2014-05-10 ENCOUNTER — Encounter: Payer: Self-pay | Admitting: Adult Health
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
## 2014-09-01 DIAGNOSIS — M1990 Unspecified osteoarthritis, unspecified site: Secondary | ICD-10-CM | POA: Diagnosis not present

## 2014-09-01 DIAGNOSIS — M549 Dorsalgia, unspecified: Secondary | ICD-10-CM | POA: Diagnosis not present

## 2014-09-01 DIAGNOSIS — M79609 Pain in unspecified limb: Secondary | ICD-10-CM | POA: Diagnosis not present

## 2014-09-01 DIAGNOSIS — M79673 Pain in unspecified foot: Secondary | ICD-10-CM | POA: Diagnosis not present

## 2014-09-11 DIAGNOSIS — R05 Cough: Secondary | ICD-10-CM | POA: Diagnosis not present

## 2014-09-11 DIAGNOSIS — J449 Chronic obstructive pulmonary disease, unspecified: Secondary | ICD-10-CM | POA: Diagnosis not present

## 2014-09-11 DIAGNOSIS — J310 Chronic rhinitis: Secondary | ICD-10-CM | POA: Diagnosis not present

## 2014-09-11 DIAGNOSIS — R0902 Hypoxemia: Secondary | ICD-10-CM | POA: Diagnosis not present

## 2014-09-12 DIAGNOSIS — J449 Chronic obstructive pulmonary disease, unspecified: Secondary | ICD-10-CM | POA: Diagnosis not present

## 2014-09-27 DIAGNOSIS — G894 Chronic pain syndrome: Secondary | ICD-10-CM | POA: Diagnosis not present

## 2014-09-27 DIAGNOSIS — M797 Fibromyalgia: Secondary | ICD-10-CM | POA: Diagnosis not present

## 2014-09-27 DIAGNOSIS — Z79899 Other long term (current) drug therapy: Secondary | ICD-10-CM | POA: Diagnosis not present

## 2014-09-27 DIAGNOSIS — M1990 Unspecified osteoarthritis, unspecified site: Secondary | ICD-10-CM | POA: Diagnosis not present

## 2014-10-12 DIAGNOSIS — Z9981 Dependence on supplemental oxygen: Secondary | ICD-10-CM | POA: Diagnosis not present

## 2014-10-12 DIAGNOSIS — R110 Nausea: Secondary | ICD-10-CM | POA: Diagnosis not present

## 2014-10-12 DIAGNOSIS — R05 Cough: Secondary | ICD-10-CM | POA: Diagnosis not present

## 2014-10-12 DIAGNOSIS — J449 Chronic obstructive pulmonary disease, unspecified: Secondary | ICD-10-CM | POA: Diagnosis not present

## 2014-10-13 ENCOUNTER — Ambulatory Visit
Admission: RE | Admit: 2014-10-13 | Discharge: 2014-10-13 | Disposition: A | Discharge status: Home / Self Care | Payer: Medicare Other | Source: Ambulatory Visit | Attending: Nurse Practitioner | Admitting: Nurse Practitioner

## 2014-10-13 ENCOUNTER — Other Ambulatory Visit: Payer: Self-pay | Admitting: Nurse Practitioner

## 2014-10-13 DIAGNOSIS — J449 Chronic obstructive pulmonary disease, unspecified: Secondary | ICD-10-CM | POA: Diagnosis not present

## 2014-10-13 DIAGNOSIS — R05 Cough: Secondary | ICD-10-CM | POA: Diagnosis not present

## 2014-10-13 DIAGNOSIS — M25511 Pain in right shoulder: Secondary | ICD-10-CM

## 2014-11-06 DIAGNOSIS — M797 Fibromyalgia: Secondary | ICD-10-CM | POA: Diagnosis not present

## 2014-11-06 DIAGNOSIS — M1990 Unspecified osteoarthritis, unspecified site: Secondary | ICD-10-CM | POA: Diagnosis not present

## 2014-11-06 DIAGNOSIS — Z79899 Other long term (current) drug therapy: Secondary | ICD-10-CM | POA: Diagnosis not present

## 2014-11-06 DIAGNOSIS — G894 Chronic pain syndrome: Secondary | ICD-10-CM | POA: Diagnosis not present

## 2014-11-11 DIAGNOSIS — J449 Chronic obstructive pulmonary disease, unspecified: Secondary | ICD-10-CM | POA: Diagnosis not present

## 2014-11-30 ENCOUNTER — Inpatient Hospital Stay
Admit: 2014-11-30 | Disposition: A | Discharge status: Home / Self Care | Payer: Self-pay | Attending: Internal Medicine | Admitting: Internal Medicine

## 2014-11-30 DIAGNOSIS — J9622 Acute and chronic respiratory failure with hypercapnia: Secondary | ICD-10-CM | POA: Diagnosis not present

## 2014-11-30 DIAGNOSIS — G9341 Metabolic encephalopathy: Secondary | ICD-10-CM | POA: Diagnosis not present

## 2014-11-30 DIAGNOSIS — J9692 Respiratory failure, unspecified with hypercapnia: Secondary | ICD-10-CM | POA: Diagnosis not present

## 2014-11-30 DIAGNOSIS — G9340 Encephalopathy, unspecified: Secondary | ICD-10-CM | POA: Diagnosis not present

## 2014-11-30 DIAGNOSIS — Z7951 Long term (current) use of inhaled steroids: Secondary | ICD-10-CM | POA: Diagnosis not present

## 2014-11-30 DIAGNOSIS — Z66 Do not resuscitate: Secondary | ICD-10-CM | POA: Diagnosis not present

## 2014-11-30 DIAGNOSIS — R400 Somnolence: Secondary | ICD-10-CM | POA: Diagnosis not present

## 2014-11-30 DIAGNOSIS — G894 Chronic pain syndrome: Secondary | ICD-10-CM | POA: Diagnosis not present

## 2014-11-30 DIAGNOSIS — Z9981 Dependence on supplemental oxygen: Secondary | ICD-10-CM | POA: Diagnosis not present

## 2014-11-30 DIAGNOSIS — R918 Other nonspecific abnormal finding of lung field: Secondary | ICD-10-CM | POA: Diagnosis not present

## 2014-11-30 DIAGNOSIS — Z8051 Family history of malignant neoplasm of kidney: Secondary | ICD-10-CM | POA: Diagnosis not present

## 2014-11-30 DIAGNOSIS — R4182 Altered mental status, unspecified: Secondary | ICD-10-CM | POA: Diagnosis not present

## 2014-11-30 DIAGNOSIS — E872 Acidosis: Secondary | ICD-10-CM | POA: Diagnosis not present

## 2014-11-30 DIAGNOSIS — Z801 Family history of malignant neoplasm of trachea, bronchus and lung: Secondary | ICD-10-CM | POA: Diagnosis not present

## 2014-11-30 DIAGNOSIS — Z87891 Personal history of nicotine dependence: Secondary | ICD-10-CM | POA: Diagnosis not present

## 2014-11-30 DIAGNOSIS — J9621 Acute and chronic respiratory failure with hypoxia: Secondary | ICD-10-CM | POA: Diagnosis not present

## 2014-11-30 DIAGNOSIS — J440 Chronic obstructive pulmonary disease with acute lower respiratory infection: Secondary | ICD-10-CM | POA: Diagnosis not present

## 2014-11-30 DIAGNOSIS — J209 Acute bronchitis, unspecified: Secondary | ICD-10-CM | POA: Diagnosis not present

## 2014-11-30 DIAGNOSIS — R410 Disorientation, unspecified: Secondary | ICD-10-CM | POA: Diagnosis not present

## 2014-11-30 DIAGNOSIS — J441 Chronic obstructive pulmonary disease with (acute) exacerbation: Secondary | ICD-10-CM | POA: Diagnosis not present

## 2014-11-30 DIAGNOSIS — F329 Major depressive disorder, single episode, unspecified: Secondary | ICD-10-CM | POA: Diagnosis not present

## 2014-11-30 DIAGNOSIS — G8929 Other chronic pain: Secondary | ICD-10-CM | POA: Diagnosis not present

## 2014-11-30 DIAGNOSIS — F419 Anxiety disorder, unspecified: Secondary | ICD-10-CM | POA: Diagnosis not present

## 2014-11-30 DIAGNOSIS — Z79899 Other long term (current) drug therapy: Secondary | ICD-10-CM | POA: Diagnosis not present

## 2014-11-30 LAB — COMPREHENSIVE METABOLIC PANEL
Albumin: 3.7 g/dL
Alkaline Phosphatase: 74 U/L
Anion Gap: 3 — ABNORMAL LOW (ref 7–16)
BUN: 9 mg/dL
Bilirubin,Total: 0.3 mg/dL
Calcium, Total: 9 mg/dL
Chloride: 96 mmol/L — ABNORMAL LOW
Co2: 39 mmol/L — ABNORMAL HIGH
Creatinine: 0.57 mg/dL
EGFR (African American): >60
EGFR (Non-African Amer.): >60
Glucose: 95 mg/dL
Potassium: 3.7 mmol/L
SGOT(AST): 15 U/L
SGPT (ALT): 11 U/L — ABNORMAL LOW
Sodium: 138 mmol/L
Total Protein: 7.2 g/dL

## 2014-11-30 LAB — CBC
HCT: 35.6 % (ref 35.0–47.0)
HGB: 11.7 g/dL — ABNORMAL LOW (ref 12.0–16.0)
MCH: 30.4 pg (ref 26.0–34.0)
MCHC: 32.9 g/dL (ref 32.0–36.0)
MCV: 92 fL (ref 80–100)
Platelet: 286 10*3/uL (ref 150–440)
RBC: 3.85 10*6/uL (ref 3.80–5.20)
RDW: 14.4 % (ref 11.5–14.5)
WBC: 6.4 10*3/uL (ref 3.6–11.0)

## 2014-11-30 LAB — URINALYSIS, COMPLETE
Bilirubin,UR: NEGATIVE
Blood: NEGATIVE
Glucose,UR: NEGATIVE mg/dL (ref 0–75)
Ketone: NEGATIVE
Leukocyte Esterase: NEGATIVE
Nitrite: NEGATIVE
Ph: 5 (ref 4.5–8.0)
Protein: NEGATIVE
Specific Gravity: 1.023 (ref 1.003–1.030)

## 2014-11-30 LAB — TROPONIN I

## 2014-11-30 LAB — TSH: Thyroid Stimulating Horm: 3.455 u[IU]/mL

## 2014-12-01 LAB — BASIC METABOLIC PANEL
Anion Gap: 6 — ABNORMAL LOW (ref 7–16)
BUN: 13 mg/dL
CALCIUM: 9.1 mg/dL
CHLORIDE: 99 mmol/L — AB
CO2: 33 mmol/L — AB
Creatinine: 0.5 mg/dL
EGFR (Non-African Amer.): >60
GLUCOSE: 154 mg/dL — AB
POTASSIUM: 3.8 mmol/L
SODIUM: 138 mmol/L

## 2014-12-01 LAB — MAGNESIUM
Magnesium: 1.9 mg/dL
Magnesium: 1.9 mg/dL

## 2014-12-01 LAB — POTASSIUM: POTASSIUM: 4.6 mmol/L

## 2014-12-01 LAB — PHOSPHORUS: PHOSPHORUS: 1.4 mg/dL — AB

## 2014-12-02 LAB — CBC WITH DIFFERENTIAL/PLATELET
Basophil #: 0 10*3/uL (ref 0.0–0.1)
Basophil %: 0.1 %
Eosinophil #: 0 10*3/uL (ref 0.0–0.7)
Eosinophil %: 0 %
HCT: 32.5 % — AB (ref 35.0–47.0)
HGB: 10.7 g/dL — AB (ref 12.0–16.0)
Lymphocyte #: 1.7 10*3/uL (ref 1.0–3.6)
Lymphocyte %: 12.8 %
MCH: 30.1 pg (ref 26.0–34.0)
MCHC: 32.9 g/dL (ref 32.0–36.0)
MCV: 92 fL (ref 80–100)
Monocyte #: 0.7 x10 3/mm (ref 0.2–0.9)
Monocyte %: 5.7 %
NEUTROS PCT: 81.4 %
Neutrophil #: 10.6 10*3/uL — ABNORMAL HIGH (ref 1.4–6.5)
Platelet: 258 10*3/uL (ref 150–440)
RBC: 3.55 10*6/uL — ABNORMAL LOW (ref 3.80–5.20)
RDW: 14.6 % — ABNORMAL HIGH (ref 11.5–14.5)
WBC: 13 10*3/uL — ABNORMAL HIGH (ref 3.6–11.0)

## 2014-12-02 LAB — BASIC METABOLIC PANEL
Anion Gap: 4 — ABNORMAL LOW (ref 7–16)
BUN: 16 mg/dL
CHLORIDE: 104 mmol/L
Calcium, Total: 9.1 mg/dL
Co2: 33 mmol/L — ABNORMAL HIGH
Creatinine: 0.43 mg/dL — ABNORMAL LOW
EGFR (African American): >60
GLUCOSE: 121 mg/dL — AB
Potassium: 4 mmol/L
Sodium: 141 mmol/L

## 2014-12-02 LAB — PHOSPHORUS: Phosphorus: 5.3 mg/dL — ABNORMAL HIGH

## 2014-12-07 DIAGNOSIS — Z79899 Other long term (current) drug therapy: Secondary | ICD-10-CM | POA: Diagnosis not present

## 2014-12-07 DIAGNOSIS — M1990 Unspecified osteoarthritis, unspecified site: Secondary | ICD-10-CM | POA: Diagnosis not present

## 2014-12-07 DIAGNOSIS — G894 Chronic pain syndrome: Secondary | ICD-10-CM | POA: Diagnosis not present

## 2014-12-07 DIAGNOSIS — M79606 Pain in leg, unspecified: Secondary | ICD-10-CM | POA: Diagnosis not present

## 2014-12-07 DIAGNOSIS — M549 Dorsalgia, unspecified: Secondary | ICD-10-CM | POA: Diagnosis not present

## 2014-12-07 DIAGNOSIS — Z9289 Personal history of other medical treatment: Secondary | ICD-10-CM | POA: Diagnosis not present

## 2014-12-07 DIAGNOSIS — Z872 Personal history of diseases of the skin and subcutaneous tissue: Secondary | ICD-10-CM | POA: Diagnosis not present
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
## 2014-12-09 NOTE — H&P (Signed)
PATIENT NAME:  Debra [name], Debra [name] MR#:  [id number] DATE OF BIRTH:  1955/11/12  DATE OF ADMISSION:  04/25/2014  PRIMARY CARE PHYSICIAN: Unknown.   CHIEF COMPLAINT: Status post fall and right hip fracture.   HISTORY OF PRESENT ILLNESS: This is a 59 year old female who presents to the hospital due to a right leg/hip pain and noted to have a right hip fracture. The patient says that she was out with her dogs and she uses oxygen at home continuously because of her COPD. Her oxygen tank got tangled up when the dogs were running around and she slipped and fell. This happened twice to her earlier today. After her second fall she was having difficulty ambulating. She called her son and he had to literally lift her and bring her to the car as she could not bear any weight on her right leg. She came to the ER and the x-ray showed a right hip fracture. Hospitalist services were contacted for further treatment and evaluation. The patient denied any prodromal symptoms prior to her fall like any chest pain, shortness of breath, nausea, vomiting, abdominal pain, fevers, chills, cough, or any other associated symptoms.   REVIEW OF SYSTEMS:  CONSTITUTIONAL: No documented fever. No weight gain, no weight loss.  EYES: No blurry or double vision.  ENT: No tinnitus. No postnasal drip. No redness of the oropharynx.  RESPIRATORY: No cough, no wheeze, no hemoptysis, no dyspnea.  CARDIOVASCULAR: No chest pain, no orthopnea, no palpitations, no syncope.  GASTROINTESTINAL: No nausea, no vomiting, no diarrhea. No abdominal pain. No melena or hematochezia.  GENITOURINARY: No dysuria and hematuria.  ENDOCRINE: No polyuria or nocturia. No heat or cold intolerance.   HEMATOLOGIC: No anemia, no bruising, no bleeding.  INTEGUMENTARY: No rashes or lesions.  MUSCULOSKELETAL: No arthritis. No swelling. No gout.  NEUROLOGIC: No numbness or tingling. No ataxia. No seizure-type activity.  PSYCHIATRIC: No anxiety. No insomnia. No ADD.    PAST MEDICAL HISTORY: Consistent with: COPD, oxygen dependent, depression/anxiety, chronic pain.   ALLERGIES: STRAWBERRIES.   SOCIAL HISTORY: Used to be a smoker. Does have a 30-40 pack-year smoking history. No alcohol abuse. No illicit drug abuse. Lives with her husband.   FAMILY HISTORY: The patient's brothers died from cancer, one from kidney cancer, one from lung cancer. She cannot recall what her mother and father died from.   CURRENT MEDICATIONS: As follows: Advair 250/50 one puff b.i.d., oxycodone 15 mg 1 tab 4-5 times a day, albuterol inhaler 2 puffs 4 times daily as needed, Prozac 20 mg daily, Spiriva 1 puff daily, Xanax 1 mg t.i.d. as needed.   PHYSICAL EXAMINATION: Presently is as follows:  VITALS SIGNS: Noted to be: Temperature is 98.4, pulse 99, respirations 18, blood pressure 95/51, sats 95% on 2 liters nasal cannula.  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  HEENT: Atraumatic, normocephalic. Extraocular muscles are intact. Pupils equal and reactive to light. Sclerae anicteric. No conjunctival injection. No pharyngeal erythema.  NECK: Supple. There is no jugular venous distention. No bruits. No lymphadenopathy. No thyromegaly.  HEART: Regular rate and rhythm. No murmurs, no rubs, no clicks.  LUNGS: Clear to auscultation bilaterally. No rales, no rhonchi. No wheezes.  ABDOMEN: Soft, flat, nontender, nondistended. Has good bowel sounds. No hepatosplenomegaly appreciated.  EXTREMITIES: No evidence of any cyanosis, clubbing, or peripheral edema, +2 pedal and radial pulses bilaterally. Her right lower extremity is shortened and externally rotated due to her fracture.  SKIN: Moist and warm with no rashes.  LYMPHATIC: There is no cervical or axillary lymphadenopathy.   LABORATORY DATA: Serum glucose of 91, BUN 11, sodium 142, potassium 105, bicarbonate 34. LFTs are within normal limits. White cell count 13.7, hemoglobin 12.0, hematocrit 37.8,  platelet count of 250,000. INR is 0.9.   The patient did have an EKG done which showed normal sinus rhythm with normal axes and no evidence of any acute ST or T wave changes. The patient also had an x-ray of her right femur showing no acute abnormality. No evidence of acute fracture but x-ray of the right hip complete showing nondisplaced right femoral neck fracture.   ASSESSMENT AND PLAN: This is a 59 year old female with a history of chronic obstructive pulmonary disease, oxygen dependence, depression/anxiety, chronic pain, who presents to the hospital due to a mechanical fall and noted to have a right hip fracture.   PROBLEMS:  1. Status post fall and right hip fracture. The patient is likely a moderate risk for noncardiac surgery. There is no absolute contraindication to surgery at this time. I have reviewed the patient's EKG which shows no acute ST or T wave changes. We will get an orthopedic consult. The patient is to be seen by Dr. Rudene Christians, placed on IV morphine for pain control.  2. Depression/anxiety. Continue with her Prozac and Xanax.  3. Chronic obstructive pulmonary disease. No acute exacerbation. Continue Advair, Spiriva, and p.r.n. DuoNeb and oxygen support. 4. Leukocytosis likely stress mediated from the fracture. Will follow her white cell count.   CODE STATUS: The patient is a full code.   TIME SPENT ON ADMISSION: 45 minutes.    ____________________________ Belia Heman. Verdell Carmine, MD vjs:lt D: 04/25/2014 19:04:58 ET T: 04/25/2014 19:31:35 ET JOB#: [id number]  cc: Belia Heman. Verdell Carmine, MD, <Dictator> Henreitta Leber MD ELECTRONICALLY SIGNED 04/26/2014 16:01

## 2014-12-09 NOTE — Discharge Summary (Signed)
PATIENT NAME:  Debra [name], Debra [name] MR#:  [id number] DATE OF BIRTH:  [birth year]  DATE OF ADMISSION:  04/25/2014 DATE OF DISCHARGE:  04/28/2014  PRESENTING COMPLAINT: Fall, hip pain.  DISCHARGE DIAGNOSES:  1.  Status post fall, right hip fracture, status post right hip pinning, stable. 2.  Depression and anxiety. 3.  Chronic obstructive pulmonary disease, on 2 liters of nasal cannula oxygen.  CONSULTATIONS: Dr. Rudene Christians, orthopedics.  PROCEDURES: Right femoral neck fracture, status post right hip pinning, September 9th.   DIAGNOSTIC DATA: Labs at discharge: Hemoglobin is 11.9, hematocrit is 36.7, white count is 11.7. Chemistry: Basic metabolic panel within normal limits. PT/INR within normal limits.   DISCHARGE MEDICATIONS: 1.  Tylenol 650 q. 4 hours p.r.n. 2.  Fluoxetine 20 mg daily. 3.  Alprazolam 1 mg q. 8 p.r.n.  4.  Advair 250/50 one puff b.i.d.  5.  Spiriva 1 capsule inhalation daily. 6.  Lovenox 30 mg subcutaneous b.i.d. for 14 days. 7.  Oxycodone 15 mg q. 6 p.r.n.  8.  ProAir HFA 90 mcg 2 puffs q. 4 hours p.r.n.   DISCHARGE INSTRUCTIONS:  1.  Follow up with Dr. Rudene Christians per ortho discharge instructions.  2.  Oxygen 2 liters per nasal cannula continuous. 3.  Physical therapy. 4.  Regular diet.   HISTORY OF PRESENT ILLNESS AND HOSPITAL COURSE: Debra [name] is a 59 year old Caucasian female with history of COPD, on home oxygen, who comes in after she had a mechanical fall while walking her dogs. She was noted to have right hip fracture, was admitted with: 1.  Status post fall, right hip fracture. The patient was seen by Dr. Rudene Christians, underwent right hip pinning on September 9th. Postop she has been doing well. Physical therapy recommended rehab for which she will be going to [facility]. 2.  Depression and anxiety. Continued with her Xanax and Prozac. 3.  COPD. No acute exacerbation. Her inhaler, nebs and oxygen was continued.  4.  DVT prophylaxis. Lovenox subcu b.i.d. for 14 more days per ortho  instructions.  Hospital stay otherwise remained stable. The patient remained a FULL code.   TIME SPENT: 40 minutes.  ____________________________ Hart Rochester Posey Pronto, MD sap:sb D: 04/28/2014 09:43:45 ET T: 04/28/2014 10:13:25 ET JOB#: [id number]  cc: Daytona Hedman A. Posey Pronto, MD, <Dictator> Laurene Footman, MD Ilda Basset MD ELECTRONICALLY SIGNED 05/08/2014 10:50

## 2014-12-09 NOTE — H&P (Signed)
PATIENT NAME:  Debra [name], Debra [name] MR#:  [id number] DATE OF BIRTH:  Sep 08, 1955  DATE OF ADMISSION:  04/10/2014  PRIMARY CARE PHYSICIAN: Dr. Toy Cookey in [place].   REFERRING PHYSICIAN: Dr. Lenise Arena.   CHIEF COMPLAINT: Shortness of breath.   HISTORY OF PRESENTING ILLNESS: A 59 year old female who has history of chronic pain syndrome after having an accident, who is on chronic pain medication.  She was a chronic smoker for 40 years, stopped smoking 4 months ago, now for the last 2 weeks she has complained of worsening of a cough and gradually started getting more and more short of breath.  For the last 2 days, which were worse, she was severely short of breath and she started using her daughter-in-law's nebulizer machine to get respiratory treatments, but it did not help much. She also had some light secretions with her cough, but denies any fever, chills or chest pain and so decided to come to the Emergency Room.  She was found having severe wheezing, and started on treatment for COPD. Her oxygen level was also low on room air, 78%, and so she was started on oxygen supplementation. This did not help much and she was started on BiPAP and referred to the hospitalist service for further management, as she had severe CO2 retention.   REVIEW OF SYSTEMS:    CONSTITUTIONAL: Negative for fever, fatigue, weakness, pain or weight loss. EYES: No blurring, double vision, discharge or redness.  EARS, NOSE, THROAT: No tinnitus, ear pain or hearing loss.  RESPIRATORY: The patient has cough, wheezing, and shortness of breath.  CARDIOVASCULAR: No chest pain, orthopnea, edema, arrhythmia, palpitations.  GASTROINTESTINAL: No nausea, vomiting, diarrhea, abdominal pain.  GENITOURINARY: No dysuria, hematuria, or increased frequency.  ENDOCRINE: No heat or cold intolerance.  SKIN: No excessive sweating or any lesions.  MUSCULOSKELETAL: No pain or swelling in the joints.  NEUROLOGICAL: No numbness, weakness, tremor  or vertigo.  PSYCHIATRIC: No anxiety, insomnia, bipolar disorder.   PAST MEDICAL HISTORY:  1.  Chronic pain syndrome in setting of traumatic leg injury on the right lower extremity.  2.  History of right lower extremity cellulitis.  3.  Depression.  4.  Chronic smoker in the past.   FAMILY HISTORY: Two brothers had cancer, one had lung cancer and the other had kidney cancer.   SOCIAL HISTORY: She was a smoker for more than 40 years. She smoked 1.5 packs a day. She stopped smoking in April 2015. No alcohol or drug abuse.  She is disabled because of her injuries and chronic pain. She lives with her husband.   HOME MEDICATIONS:  1.  Xanax 1 mg oral tablet up to 3 times a day.  2.  Prozac 20 mg oral once a day.  3.  ProAir 2-puff inhalation 4 times a day.  4.  Oxycodone 15 mg oral 4-5 times a day.   VITAL SIGNS IN THE EMERGENCY ROOM: Temperature 97.9, pulse 108, respirations 18, blood pressure 164/73. Pulse oximetry is 78 on room air.   PHYSICAL EXAMINATION: GENERAL: The patient is fully alert and oriented to time, place, and person. Slightly distressed due to respiratory failure. Does not have any other major complaints. She is cooperative with history taking and physical examination.  HEENT: Head and neck atraumatic. Conjunctivae pink. Oral mucosa moist.  NECK: Supple. No JVD.  RESPIRATORY: Bilateral equal air entry, wheezing present. Using BiPAP.  CARDIOVASCULAR: S1, S2 present, regular. No murmur. ABDOMEN: Soft, nontender. Bowel sounds present. No organomegaly.  SKIN: No rashes.  EXTREMITIES: Legs: Right leg scars and chronic edema present, a longstanding injury from  the past, no edema on the left. Joints: No swelling or tenderness.  NEUROLOGICAL: Moves all 4 limbs. Follows commands. No tremor or rigidity. Sensation is preserved.  PSYCHIATRIC: Does not appear in any acute psychiatric illness at this time.   IMPORTANT LABORATORY RESULTS: Chest x-ray shows slight bronchitic changes.    BNP is 913, glucose 105, BUN 8, creatinine 0.24, sodium 141, potassium 3.9, chloride 101, CO2 35, calcium is 9.6, total protein 7.8, albumin 3.8, bilirubin 0.3, alkaline phosphate 77, SGOT 14 SGPT 17. Troponin 0.02. WBC 10, hemoglobin 15.7, platelet count 269,000, MCV is 94, pH is 7.25, pCO2 82.   ASSESSMENT AND PLAN: A 59 year old female with chronic pain history, chronic smoker, came to Emergency Room with complaint of severe shortness of breath requiring BiPAP in the Emergency Room to maintain her oxygenation.  1.  Acute hypoxic and hypercapnic respiratory failure. This is evidenced by oxygenation of 74% on room air and pCO2 of 82, requiring BiPAP. This is secondary to chronic obstructive pulmonary disease. We will treat the underlying cause and will taper oxygenation to nasal cannula, when she can tolerate that.  2.  Chronic obstructive pulmonary disease exacerbation. We will give IV Solu-Medrol and nebulizer treatment and put her on Spiriva. We will give Levaquin to treat acute bronchitis and continue monitoring oxygenation as needed.  3.  Acute bronchitis. As mentioned above, IV Levaquin.  4.  Chronic pain syndrome. She is using oxycodone at home. Will just continue the same here.  5.  Depression. We will continue her fluoxetine as she is taking at home and Xanax on an as-needed basis.   CODE STATUS: Full code.   CONDITION: Critical because of respiratory failure, requiring BiPAP currently.   Total time spent on this admission: 50 minutes. I explained the plan to the patient and her husband in the room. They understand and agree.   ____________________________ Ceasar Lund. Anselm Jungling, MD vgv:lt D: 04/10/2014 09:46:00 ET T: 04/10/2014 12:08:20 ET JOB#: [id number]  cc: Ceasar Lund. Anselm Jungling, MD, <Dictator> Vaughan Basta MD ELECTRONICALLY SIGNED 04/23/2014 9:16

## 2014-12-09 NOTE — Op Note (Signed)
PATIENT NAME:  Debra [name], Debra [name] MR#:  [id number] DATE OF BIRTH:  03-20-1956  DATE OF PROCEDURE:  04/26/2014  PREOPERATIVE DIAGNOSIS: Right femoral neck fracture.   POSTOPERATIVE DIAGNOSIS:  Right femoral neck fracture.   PROCEDURE: Right hip pinning.   ANESTHESIA: Spinal.   SURGEON: Hessie Knows, MD.   DESCRIPTION OF PROCEDURE: The patient was brought to the operating room and after adequate spinal anesthesia was obtained the patient was placed on the fracture table with the left leg in the well leg holder, right leg in the traction boot with no traction applied. C-arm was brought in and there was no displacement compared to the preoperative x-ray. After prepping and draping using the barrier drape method appropriate patient identification and timeout procedures were completed. The patient had some skin sensation in the area of the planned incision with pinching, so 30 mL of 1% Xylocaine with epinephrine was infiltrated in the skin and subcutaneous tissue. A small lateral incision was made approximately 1 inch in length. Subcutaneous tissue and vastus lateralis split and guide pin, soft tissue protector was placed down to the lateral femoral cortex. Three parallel K wires were inserted across the fracture site checking position on both AP and lateral position. These were then measured with soft tissue protector in place. The outer cortex was drilled, followed by tapping and placing of 7.3 long threaded Synthes 7.3 cannulated screws. After having measured, drilling and tapping the screws were placed first with power and then using hand tightening to get them to the appropriate level. AP and lateral projection showed no penetration of the joint. There was good position of the three screws. The wound was irrigated and then closed with 2-0 Vicryl subcutaneously and skin staples. Xeroform, ABD and tape were then applied. The patient was sent to the recovery room in stable condition.   ESTIMATED BLOOD LOSS: 20  mL.   COMPLICATIONS: None.   SPECIMEN: None.   IMPLANTS: 7.3 Synthes cannulated screw x 3.    ____________________________ Laurene Footman, MD mjm:bu D: 04/26/2014 19:46:36 ET T: 04/26/2014 21:40:39 ET JOB#: [id number]  cc: Laurene Footman, MD, <Dictator> Laurene Footman MD ELECTRONICALLY SIGNED 04/27/2014 8:17

## 2014-12-09 NOTE — Discharge Summary (Signed)
Dates of Admission and Diagnosis:  Date of Admission 10-Apr-2014   Date of Discharge 12-Apr-2014   Admitting Diagnosis ac respi failure, copd   Final Diagnosis Ac hypoxic respi failure COPD exacerbation Ch smoker Ch pain.    Chief Complaint/History of Present Illness A 59 year old female who has history of chronic pain syndrome after having an accident, who is on chronic pain medication.  She was a chronic smoker for 40 years, stopped smoking 4 months ago, now for the last 2 weeks she has complained of worsening of a cough and gradually started getting more and more short of breath.  For the last 2 days, which were worse, she was severely short of breath and she started using her daughter-in-law???s nebulizer machine to get respiratory treatments, but it did not help much. She also had some light secretions with her cough, but denies any fever, chills or chest pain and so decided to come to the Emergency Room.  She was found having severe wheezing, and started on treatment for COPD. Her oxygen level was also low on room air, 78%, and so she was started on oxygen supplementation. This did not help much and she was started on BiPAP and referred to the hospitalist service for further management, as she had severe CO2 retention.   Allergies:  Strawberry: Hives  Hepatic:  24-Aug-15 08:09   Bilirubin, Total 0.3  Alkaline Phosphatase 77 (46-116 NOTE: New Reference Range 03/07/14)  SGPT (ALT) 17 (14-63 NOTE: New Reference Range 03/07/14)  SGOT (AST)  14  Total Protein, Serum 7.8  Albumin, Serum 3.8  Routine Micro:  24-Aug-15 08:09   Micro Text Report BLOOD CULTURE   COMMENT                   NO GROWTH IN 48 HOURS   ANTIBIOTIC                       Culture Comment NO GROWTH IN 48 HOURS  Result(s) reported on 12 Apr 2014 at 08:00AM.    08:10   Micro Text Report BLOOD CULTURE   COMMENT                   NO GROWTH IN 24 HOURS   ANTIBIOTIC                       Culture Comment NO  GROWTH IN 71 HOURS  Result(s) reported on 12 Apr 2014 at 08:00AM.  Routine Chem:  24-Aug-15 08:09   Glucose, Serum  105  BUN 8  Creatinine (comp) 0.74  Sodium, Serum 141  Potassium, Serum 3.9  Chloride, Serum 101  CO2, Serum  35  Calcium (Total), Serum 9.6  Anion Gap  5  Osmolality (calc) 280  eGFR (African American) >60  eGFR (Non-African American) >60 (eGFR values <25m/min/1.73 m2 may be an indication of chronic kidney disease (CKD). Calculated eGFR is useful in patients with stable renal function. The eGFR calculation will not be reliable in acutely ill patients when serum creatinine is changing rapidly. It is not useful in  patients on dialysis. The eGFR calculation may not be applicable to patients at the low and high extremes of body sizes, pregnant women, and vegetarians.)  B-Type Natriuretic Peptide ([facility])  913 (Result(s) reported on 10 Apr 2014 at 08:52AM.)  25-Aug-15 04:48   Glucose, Serum  136  BUN 12  Creatinine (comp) 0.62  Sodium, Serum 140  Potassium, Serum 4.1  Chloride, Serum 100  CO2, Serum  36  Calcium (Total), Serum 8.8  Anion Gap  4  Osmolality (calc) 281  eGFR (African American) >60  eGFR (Non-African American) >60 (eGFR values <20m/min/1.73 m2 may be an indication of chronic kidney disease (CKD). Calculated eGFR is useful in patients with stable renal function. The eGFR calculation will not be reliable in acutely ill patients when serum creatinine is changing rapidly. It is not useful in  patients on dialysis. The eGFR calculation may not be applicable to patients at the low and high extremes of body sizes, pregnant women, and vegetarians.)  Cardiac:  24-Aug-15 08:09   CK, Total 40 (26-192 NOTE: NEW REFERENCE RANGE  09/19/2013)  CPK-MB, Serum 2.0 (Result(s) reported on 10 Apr 2014 at 08:52AM.)  Troponin I 0.02 (0.00-0.05 0.05 ng/mL or less: NEGATIVE  Repeat testing in 3-6 hrs  if clinically indicated. >0.05 ng/mL: POTENTIAL  MYOCARDIAL  INJURY. Repeat  testing in 3-6 hrs if  clinically indicated. NOTE: An increase or decrease  of 30% or more on serial  testing suggests a  clinically important change)  Routine Hem:  24-Aug-15 08:09   WBC (CBC) 10.0  RBC (CBC)  5.23  Hemoglobin (CBC) 15.7  Hematocrit (CBC)  49.3  Platelet Count (CBC) 269 (Result(s) reported on 10 Apr 2014 at 08:48AM.)  MCV 94  MCH 30.1  MCHC  31.9  RDW  15.6  25-Aug-15 04:48   WBC (CBC) 9.7  RBC (CBC) 4.54  Hemoglobin (CBC) 14.2  Hematocrit (CBC) 42.0  Platelet Count (CBC) 222  MCV 92  MCH 31.1  MCHC 33.7  RDW  15.1  Neutrophil % 89.0  Lymphocyte % 8.9  Monocyte % 1.9  Eosinophil % 0.1  Basophil % 0.1  Neutrophil #  8.7  Lymphocyte #  0.9  Monocyte # 0.2  Eosinophil # 0.0  Basophil # 0.0 (Result(s) reported on 11 Apr 2014 at 05:35AM.)   PERTINENT RADIOLOGY STUDIES: XRay:    24-Aug-15 08:01, Chest Portable Single View  Chest Portable Single View   REASON FOR EXAM:    Shortness of Breath  COMMENTS:       PROCEDURE: DXR - DXR PORTABLE CHEST SINGLE VIEW  - Apr 10 2014  8:01AM     CLINICAL DATA:  Shortness of breath.  Cough.  Nausea and vomiting.    EXAM:  PORTABLE CHEST - 1 VIEW    COMPARISON:  09/06/2012    FINDINGS:  Heart size and pulmonary vascularity are normal. There is slight  peribronchial thickening. No infiltrates or effusions. No osseous  abnormality.     IMPRESSION:  Slight bronchitic changes.      Electronically Signed    By: JRozetta NunneryM.D.    On: 04/10/2014 08:08         Verified By: JLarey Seat M.D.,    25-Aug-15 07:22, Chest PA and Lateral  Chest PA and Lateral   REASON FOR EXAM:    compare- bronchitis/ pneumonia  COMMENTS:       PROCEDURE: DXR - DXR CHEST PA (OR AP) AND LATERAL  - Apr 11 2014  7:22AM     CLINICAL DATA:  Bronchitis?    EXAM:  CHEST  2 VIEW    COMPARISON:  04/10/2014.    FINDINGS:  The heart size and mediastinal contours are within normal limits.  Both lungs are  clear. The visualized skeletal structures are  unremarkable.     IMPRESSION:  No Active cardiopulmonary disease. No change from yesterday's  radiograph.      Electronically Signed    By: Rolla Flatten M.D.    On: 04/11/2014 08:10         Verified By: Staci Righter, M.D.,  [place]:    24-Aug-15 08:01, Chest Portable Single View  PACS Image     25-Aug-15 07:22, Chest PA and Lateral  PACS Image    Pertinent Past History:  Pertinent Past History 1.  Chronic pain syndrome in setting of traumatic leg injury on the right lower extremity.  2.  History of right lower extremity cellulitis.  3.  Depression.  4.  Chronic smoker in the past.   Hospital Course:  Hospital Course A 59 year old female with chronic pain history, chronic smoker, came to Emergency Room with complaint of severe shortness of breath requiring BiPAP in the Emergency Room to maintain her oxygenation.  1.  Acute hypoxic and hypercapnic respiratory failure. This is evidenced by oxygenation of 74% on room air and pCO2 of 82, requiring BiPAP. This is secondary to chronic obstructive pulmonary disease.  treat the underlying cause and  taper oxygenationwhen she can tolerate that.    COPD improving but still sats dropped to 87% on room air- need home O2- d/c today  2.  Chronic obstructive pulmonary disease exacerbation.    IV Solu-Medrol and nebulizer treatment , Spiriva.  give Levaquin to treat acute bronchitis and continue monitoring oxygenation as needed.    councelled about new oxygen requirement- and following up with Dr. Raul Del in office.  3.  Acute bronchitis. As mentioned above, IV Levaquin.  4.  Chronic pain syndrome. She is using oxycodone at home.  continue the same here.  5.  Depression. continue her fluoxetine as she is taking at home and Xanax on an as-needed basis.   Condition on Discharge Stable   Code Status:  Code Status Full Code   DISCHARGE INSTRUCTIONS HOME MEDS:  Medication  Reconciliation: Patient's Home Medications at Discharge:     Medication Instructions  prozac 20 mg oral capsule  1 cap(s) orally once a day   oxycodone 15 mg oral tablet  1 tab(s) orally 4-5 times a day   xanax 1 mg oral tablet  1 tab(s) orally up to 3 times a day   prednisone 10 mg oral tablet  Start at 60 mg and taper by 10 mg daily until complete   tiotropium 18 mcg inhalation capsule  1 cap(s) inhaled once a day   advair diskus 250 mcg-50 mcg inhalation powder  1 puff(s) inhaled 2 times a day   proair hfa cfc free 90 mcg/inh inhalation aerosol  2 puff(s) inhaled 4 times a day, As Needed - for Shortness of Breath   levofloxacin 500 mg oral tablet  1 tab(s) orally every 24 hours x 3 days     Physician's Instructions:  Home Oxygen? Yes   Oxygen delivery at home: 2L  Nasal Cannula   Diet Regular   Activity Limitations As tolerated   Return to Work Not Applicable   Time frame for Follow Up Appointment 1-2 weeks  with PMD and Pulmonary clinic.   Other Comments schedule appointment with Dr. Raul Del in 7-10 days.     Wallene Huh E(Consultant): [facility] Department of Pulmonology, [address] [place], [place] [address], [place]  Electronic Signatures: Vaughan Basta (MD)  (Signed 28-Aug-15 15:46)  Authored: ADMISSION DATE AND DIAGNOSIS, CHIEF COMPLAINT/HPI, Allergies, PERTINENT LABS, PERTINENT RADIOLOGY STUDIES, PERTINENT PAST HISTORY, HOSPITAL COURSE, DISCHARGE INSTRUCTIONS HOME MEDS, PATIENT INSTRUCTIONS,  Follow Up Physician   Last Updated: 28-Aug-15 15:46 by Vaughan Basta (MD)

## 2014-12-09 NOTE — Consult Note (Signed)
Brief Consult Note: Diagnosis: right femoral neck fracture.   Patient was seen by consultant.   Recommend to proceed with surgery or procedure.   Orders entered.   Comments: 0lan hip pinning tomorrow.  Electronic Signatures: Laurene Footman (MD)  (Signed 08-Sep-15 19:01)  Authored: Brief Consult Note   Last Updated: 08-Sep-15 19:01 by Laurene Footman (MD)

## 2014-12-12 DIAGNOSIS — J449 Chronic obstructive pulmonary disease, unspecified: Secondary | ICD-10-CM | POA: Diagnosis not present

## 2014-12-14 DIAGNOSIS — R0902 Hypoxemia: Secondary | ICD-10-CM | POA: Diagnosis not present

## 2014-12-14 DIAGNOSIS — J432 Centrilobular emphysema: Secondary | ICD-10-CM | POA: Diagnosis not present

## 2014-12-14 DIAGNOSIS — E872 Acidosis: Secondary | ICD-10-CM | POA: Diagnosis not present

## 2014-12-14 DIAGNOSIS — R0609 Other forms of dyspnea: Secondary | ICD-10-CM | POA: Diagnosis not present

## 2014-12-17 NOTE — Discharge Summary (Signed)
PATIENT NAME:  Debra [name], VIAR MR#:  [id number] DATE OF BIRTH:  [birth year]  DATE OF ADMISSION:  11/30/2014 DATE OF DISCHARGE:  12/02/2014  ADMITTING DIAGNOSES: Acute on chronic respiratory failure with hypoxia and hypercapnia.   DISCHARGE DIAGNOSES: 1. Acute on chronic respiratory failure with hypoxia as well as hypercapnia.  2. Metabolic encephalopathy due to above.  3. Chronic obstructive pulmonary disease exacerbation.  4. Acute bronchitis.  5. Hypophosphatemia, resolved.  6. History of anxiety.  7. History of depression.  8. History of chronic pain syndrome.  9. History of chronic respiratory failure on 2 liters of oxygen through nasal cannula at home.   DISCHARGE CONDITION: Stable.   DISCHARGE MEDICATIONS: The patient is to continue: 1. Advair Diskus 250/50 one puff twice daily.  2. ProAir HFA 2 puffs 4 times daily as needed.  3. Oxycodone 15 mg 5 times daily as needed. 4. Omeprazole 20 mg p.o. daily.  5. Probiotic formula once daily.  6. alprazolam 1 mg 3 times daily as needed.  7. albuterol 2.5 mg nebulizer 6 times daily as needed.  8. Prednisone taper at 40 mg p.o. once on 12/03/2014, then taper x 10 mg every 2 days until stopped.  9. Azithromycin 500 mg once daily for 4 more days.   Home oxygen at 2 liters of oxygen through nasal cannula.   DIET:  Regular consistency.   ACTIVITY LIMITATIONS: As tolerated.   FOLLOW-UP:   Appointment with Wallene Huh, MD, in 1 week after discharge; Dr. Toy Cookey in 2 days after discharge.   CONSULTANTS: Care management, social work, respiratory therapy, Vilinda Boehringer, MD.  RADIOLOGIC STUDIES: Chest x-ray, portable, single view 11/30/2014 revealing 0.9 cm nodular opacity projecting in the left upper lobe not visible on prior examinations including study 2 months ago.  Short term follow-up PA and lateral chest x-rays  in 3-4 weeks or CT scan of the chest with contrast should be used for further evaluation. CT scan of the head without  contrast revealed negative head CT.    HISTORY OF PRESENT ILLNESS:   The patient is a 59 year old Caucasian female with past medical history significant for history of chronic respiratory failure who is oxygen dependent who presents to the hospital on 11/30/2014 with complaints of confusion for the past 2 or 3 weeks and worsening on the day of admission. Please refer to Dr. Lianne Moris admission note on 11/30/2014.   PHYSICAL EXAMINATION: On arrival to the Emergency Room, the patient's vitals: Temperature was 98.1, pulse was 78, respirations it was not checked, blood pressure 110/66, saturation was at 100% on BiPAP. Physical exam revealed bilaterally diminished air entry, weak breath sounds, mild expiratory wheezing, but no use of accessory muscles. Otherwise, physical examination was unremarkable.   LABORATORY DATA:  The patient's lab data done on arrival to the Emergency Room showed elevated CO2 level, bicarbonate level of 39 on BMP. The patient's liver enzymes were normal. Cardiac enzymes first set and the only set, troponin was less than 0.03. TSH was normal at 3.455. The patient's white blood cell count was normal at 6.4, hemoglobin was 11.7, platelet count was 286,000. Urinalysis was unremarkable. The patient's ABGs performed on the 32% FiO2, showed pH of 7.33, pCO2 was 81, pO2 was 103, saturation was 97.4% on nasal cannula. EKG showed normal sinus rhythm at 79 beats per minute, normal axis, no acute ST-T changes were noted.   HOSPITAL COURSE:   The patient was admitted to the hospital for further evaluation. She was initiated on BiPAP and  she was sent to  intensive care unit. She was initiated on steroids and nebulizers, inhalers as well as antibiotics with Zithromax.   With this, her condition improved and  she was ready to be to go home on 12/02/2014. She was seen by Dr. Stevenson Clinch, pulmonary intensive care physician, who felt that the patient should be continued on p.o. steroids tapering over 10 days. Also,  continue azithromycin and DuoNebs as well as incentive spirometry, supplemented oxygen to keep saturations at around 88-92% and BiPAP during hospitalization only and then get to be seen by primary pulmonologist as outpatient and make decisions about need for BiPAP as outpatient. The patient is being discharged in stable condition with the above-mentioned medications and follow-up. On the day of discharge, temperature was 98, pulse was 78-99, respiration was 18-19, blood pressure 108/67, saturation was 94% on 2 liters of oxygen through nasal cannula at rest and 93 on exertion.    Of note, the patient was also hypophosphatemic with a phosphorus level of 1.4 on 12/01/2014.  Phosphorus was supplemented and it  normalized.      ____________________________ Theodoro Grist, MD rv:tr D: 12/02/2014 14:48:01 ET T: 12/02/2014 17:01:33 ET JOB#: [id number]  cc: Theodoro Grist, MD, <Dictator> Herbon E. Raul Del, MD Dr. Ivy Lynn MD ELECTRONICALLY SIGNED 12/05/2014 19:24

## 2014-12-17 NOTE — H&P (Signed)
PATIENT NAME:  Debra [name], Debra [name] MR#:  [id number] DATE OF BIRTH:  Mar 14, 1956  DATE OF ADMISSION:  11/30/2014  ADDENDUM:  As mentioned above the patient is being admitted for acute on chronic respiratory failure with COPD exacerbation. Discussed the patient's condition and plan of treatment with the patient and the patient's husband.  The patient wants DNR.    TIME SPENT: About 62 minutes.     ____________________________ Demetrios Loll, MD qc:bu D: 11/30/2014 17:17:42 ET T: 11/30/2014 18:38:41 ET JOB#: [id number]  cc: Demetrios Loll, MD, <Dictator> Demetrios Loll MD ELECTRONICALLY SIGNED 12/01/2014 15:09

## 2014-12-17 NOTE — H&P (Signed)
PATIENT NAME:  Debra [name], Debra [name] MR#:  [id number] DATE OF BIRTH:  1956/06/28  DATE OF ADMISSION:  11/30/2014  PRIMARY CARE PHYSICIAN: Dr. Toy Cookey   REFERRING PHYSICIAN:  Dr. Delman Kitten  CHIEF COMPLAINT: Confusion for 2 to 3 weeks and worsening today.   HISTORY OF PRESENT ILLNESS: A 59 year old Caucasian female with a history of COPD and chronic respiratory failure on home oxygen was sent to ED due to confusion. The patient is on BiPAP now and alert, awake. According to the patient and the patient's husband, the patient has been confused on and off for the past 2 to 3 weeks. The patient also has cough, sputum, shortness of breath, and wheezing. The patient denies any fever or chills; denies any chest pain, palpitation. No orthopnea or nocturnal dyspnea. No leg edema. The patient denies any other symptoms.   PAST MEDICAL HISTORY: COPD on home oxygen, depression, anxiety, chronic pain syndrome.   SOCIAL HISTORY: The patient quit smoking. The patient used to be a smoker had quit smoking years ago; denies any alcohol drinking, or illicit drugs.   FAMILY HISTORY: A brother died from cancer, one from kidney cancer, one from lung cancer.   ALLERGIES: STRAWBERRY.   HOME MEDICATION: Spiriva 18 mcg 1 capsule once a day, Probiotic formula 1 capsule once a day, ProAir HFA CFC 90 mcg 2 puffs 4 times a day p.r.n. for shortness of breath; oxycodone 15 mg 1 tablet 5 times a day p.r.n., omeprazole 20 mg p.o. daily, Xanax 1 mg p.o. 3 times a day p.r.n., Advair 250 mcg/50 mcg 1 tablet twice a day.   REVIEW OF SYSTEMS:  CONSTITUTIONAL: The patient denies any fever or chills. No headache or dizziness.  EYES: No double vision or blurred vision.  ENT: No postnasal drip, or slurred speech or dysphagia.  CARDIOVASCULAR: No chest pain, palpitation. No orthopnea. No nocturnal dyspnea. No leg edema.  PULMONARY: Positive for cough, sputum, shortness of breath, and wheezing; no hemoptysis.  GASTROINTESTINAL: No abdominal  pain, nausea, vomiting, diarrhea. No melena or bloody stool.  GENITOURINARY: No dysuria, hematuria, or incontinence.  SKIN: No rash or jaundice.  NEUROLOGIC: No syncope, loss of consciousness, or seizure, but has confusion on and off.  HEMATOLOGY: No easy bleeding or bleeding.  ENDOCRINOLOGY: No polyuria, polydipsia, heat or cold intolerance.   VITAL SIGNS: Temperature 98.1, blood pressure 110/66, pulse at 78, oxygen saturation 100% on BiPAP.   PHYSICAL EXAMINATION:  GENERAL: The patient is alert, awake, oriented now, in no acute distress on BiPAP.  HEENT: Pupils round, equal and reactive to light and accommodation. Moist oral mucosa. Clear oropharynx.  NECK: Supple. No JVD or carotid bruit. No lymphadenopathy. No thyromegaly.  CARDIOVASCULAR: S1 and S2; regular rate and rhythm. No murmurs or gallops.  PULMONARY: Bilateral diminished are entry, weak breath sounds, mild expiratory wheezing, no use of accessory muscle to breathe.  ABDOMEN: Soft. No distention. No tenderness. No organomegaly. Bowel sounds present.  EXTREMITIES: No edema, clubbing or cyanosis. No calf tenderness. Bilateral pedal pulses present.  SKIN: No rash or jaundice.  NEUROLOGIC: AO x 3. No focal deficit; power 4 out of 5, sensation intact.   DIAGNOSTIC DATA: Glucose 95, BUN 9, creatinine 0.70; electrolytes normal; CBC in normal range, except hemoglobin 11.7; urinalysis is negative; ABG showed pH 7.33, pCO2 of 81, pO2 of 103, with FiO2 of 32%.   EKG showed normal sinus rhythm at 79 BPM.   IMPRESSIONS: 1.  Acute on chronic respiratory failure with hypercapnia.  2.  Chronic  obstructive pulmonary disease exacerbation.  3.  Anxiety.  4.  Chronic pain syndrome.  5.  Altered mental status acute, acute encephalopathy, possibly due to hypercapnia.   PLAN OF TREATMENT: 1. The patient will be admitted to stepdown unit, we will continue BiPAP, start Solu-Medrol, and DuoNeb, continue Spiriva, Advair, and we will get a pulmonary  consult.  2. For anxiety we will continue Xanax p.r.n.   Dictation ends here.  Please see addendum for continuation of dictation.    ____________________________ Demetrios Loll, MD qc:nt D: 11/30/2014 17:16:00 ET T: 11/30/2014 17:52:00 ET JOB#: [id number]  cc: Demetrios Loll, MD, <Dictator> Demetrios Loll MD ELECTRONICALLY SIGNED 12/01/2014 16:56

## 2014-12-23 ENCOUNTER — Emergency Department
Admission: EM | Admit: 2014-12-23 | Discharge: 2014-12-23 | Disposition: A | Discharge status: Home / Self Care | Payer: Medicare Other | Attending: Emergency Medicine | Admitting: Emergency Medicine

## 2014-12-23 ENCOUNTER — Emergency Department: Payer: Medicare Other

## 2014-12-23 ENCOUNTER — Encounter: Payer: Self-pay | Admitting: Emergency Medicine

## 2014-12-23 DIAGNOSIS — F17210 Nicotine dependence, cigarettes, uncomplicated: Secondary | ICD-10-CM | POA: Diagnosis not present

## 2014-12-23 DIAGNOSIS — Z7952 Long term (current) use of systemic steroids: Secondary | ICD-10-CM | POA: Insufficient documentation

## 2014-12-23 DIAGNOSIS — Z79899 Other long term (current) drug therapy: Secondary | ICD-10-CM | POA: Insufficient documentation

## 2014-12-23 DIAGNOSIS — Z87891 Personal history of nicotine dependence: Secondary | ICD-10-CM | POA: Insufficient documentation

## 2014-12-23 DIAGNOSIS — J441 Chronic obstructive pulmonary disease with (acute) exacerbation: Secondary | ICD-10-CM | POA: Insufficient documentation

## 2014-12-23 DIAGNOSIS — R5383 Other fatigue: Secondary | ICD-10-CM

## 2014-12-23 DIAGNOSIS — R400 Somnolence: Secondary | ICD-10-CM | POA: Diagnosis not present

## 2014-12-23 DIAGNOSIS — J440 Chronic obstructive pulmonary disease with acute lower respiratory infection: Secondary | ICD-10-CM | POA: Diagnosis not present

## 2014-12-23 DIAGNOSIS — J449 Chronic obstructive pulmonary disease, unspecified: Secondary | ICD-10-CM

## 2014-12-23 DIAGNOSIS — Z792 Long term (current) use of antibiotics: Secondary | ICD-10-CM | POA: Diagnosis not present

## 2014-12-23 LAB — CBC
HCT: 33.8 % — ABNORMAL LOW (ref 35.0–47.0)
Hemoglobin: 11.1 g/dL — ABNORMAL LOW (ref 12.0–16.0)
MCH: 30.4 pg (ref 26.0–34.0)
MCHC: 32.7 g/dL (ref 32.0–36.0)
MCV: 93 fL (ref 80.0–100.0)
Platelets: 286 10*3/uL (ref 150–440)
RBC: 3.63 MIL/uL — AB (ref 3.80–5.20)
RDW: 14.8 % — ABNORMAL HIGH (ref 11.5–14.5)
WBC: 5.9 10*3/uL (ref 3.6–11.0)

## 2014-12-23 LAB — BASIC METABOLIC PANEL
Anion gap: 6 (ref 5–15)
BUN: 10 mg/dL (ref 6–20)
CO2: 34 mmol/L — ABNORMAL HIGH (ref 22–32)
CREATININE: 0.61 mg/dL (ref 0.44–1.00)
Calcium: 9 mg/dL (ref 8.9–10.3)
Chloride: 97 mmol/L — ABNORMAL LOW (ref 101–111)
GFR calc Af Amer: >60 mL/min (ref >60)
Glucose, Bld: 97 mg/dL (ref 65–99)
Potassium: 4.2 mmol/L (ref 3.5–5.1)
SODIUM: 137 mmol/L (ref 135–145)

## 2014-12-23 MED ORDER — PREDNISONE 20 MG PO TABS
ORAL_TABLET | ORAL | Status: AC
Start: 2014-12-23 — End: 2014-12-23
  Administered 2014-12-23: 60 mg via ORAL
  Filled 2014-12-23: qty 3

## 2014-12-23 MED ORDER — PREDNISONE 20 MG PO TABS: 40.0000 mg | ORAL_TABLET | Freq: Every day | ORAL | Status: DC

## 2014-12-23 MED ORDER — IPRATROPIUM-ALBUTEROL 0.5-2.5 (3) MG/3ML IN SOLN
3.0000 mL | Freq: Once | RESPIRATORY_TRACT | Status: AC
  Administered 2014-12-23: 3 mL via RESPIRATORY_TRACT

## 2014-12-23 MED ORDER — IPRATROPIUM-ALBUTEROL 0.5-2.5 (3) MG/3ML IN SOLN
RESPIRATORY_TRACT | Status: AC
  Administered 2014-12-23: 3 mL via RESPIRATORY_TRACT
  Filled 2014-12-23: qty 3

## 2014-12-23 MED ORDER — PREDNISONE 20 MG PO TABS
60.0000 mg | ORAL_TABLET | Freq: Once | ORAL | Status: AC
  Administered 2014-12-23: 60 mg via ORAL

## 2014-12-23 NOTE — ED Notes (Signed)
Patient to ED with reports of possible increase in CO2, reports being increasingly sleepy and not always acting herself. Reports being seen here for same a few weeks ago and carbon dioxide was high at that time.

## 2014-12-23 NOTE — Discharge Instructions (Signed)
Chronic Asthmatic Bronchitis Chronic asthmatic bronchitis is a complication of persistent asthma. After a period of time with asthma, some people develop airflow obstruction that is present all the time, even when not having an asthma attack.There is also persistent inflammation of the airways, and the bronchial tubes produce more mucus. Chronic asthmatic bronchitis usually is a permanent problem with the lungs. CAUSES  Chronic asthmatic bronchitis happens most often in people who have asthma and also smoke cigarettes. Occasionally, it can happen to a person with long-standing or severe asthma even if the person is not a smoker. SIGNS AND SYMPTOMS  Chronic asthmatic bronchitis usually causes symptoms of both asthma and chronic bronchitis, including:   Coughing.  Increased sputum production.  Wheezing and shortness of breath.  Chest discomfort.  Recurring infections. DIAGNOSIS  Your health care provider will take a medical history and perform a physical exam. Chronic asthmatic bronchitis is suspected when a person with asthma has abnormal results on breathing tests (pulmonary function tests) even when breathing symptoms are at their best. Other tests, such as a chest X-ray, may be performed to rule out other conditions.  TREATMENT  Treatment involves controlling symptoms with medicine and lifestyle changes.  Your health care provider may prescribe asthma medicines, including inhaler and nebulizer medicines.  Infection can be treated with medicine to kill germs (antibiotics). Serious infections may require hospitalization. These can include:  Pneumonia.  Sinus infections.  Acute bronchitis.   Preventing infection and hospitalization is very important. Get an influenza vaccination every year as directed by your health care provider. Ask your health care provider whether you need a pneumonia vaccine.  Ask your health care provider whether you would benefit from a pulmonary  rehabilitation program. HOME CARE INSTRUCTIONS  Take medicines only as directed by your health care provider.  If you are a cigarette smoker, the most important thing that you can do is quit. Talk to your health care provider for help with quitting smoking.  Avoid pollen, dust, animal dander, molds, smoke, and other things that cause attacks.  Regular exercise is very important to help you feel better. Discuss possible exercise routines with your health care provider.  If animal dander is the cause of asthma, you may not be able to keep pets.  It is important that you:  Become educated about your medical condition.  Participate in maintaining wellness.  Seek medical care as directed. Delay in seeking medical care could cause permanent injury and may be a risk to your life. SEEK MEDICAL CARE IF:  You have wheezing and shortness of breath even if taking medicine to prevent attacks.  You have muscle aches, chest pain, or thickening of sputum.  Your sputum changes from clear or white to yellow, green, gray, or bloody. SEEK IMMEDIATE MEDICAL CARE IF:  Your usual medicines do not stop your wheezing.  You have increased coughing or shortness of breath or both.  You have increased difficulty breathing.  You have any problems from the medicine you are taking, such as a rash, itching, swelling, or trouble breathing. MAKE SURE YOU:   Understand these instructions.  Will watch your condition.  Will get help right away if you are not doing well or get worse. Document Released: 05/22/2006 Document Revised: 12/19/2013 Document Reviewed: 09/12/2013 [employer] Patient Information 2015 [employer]. This information is not intended to replace advice given to you by your health care provider. Make sure you discuss any questions you have with your health care provider.  
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
## 2015-01-01 DIAGNOSIS — G894 Chronic pain syndrome: Secondary | ICD-10-CM | POA: Diagnosis not present

## 2015-01-01 DIAGNOSIS — Z79899 Other long term (current) drug therapy: Secondary | ICD-10-CM | POA: Diagnosis not present

## 2015-01-01 DIAGNOSIS — M25579 Pain in unspecified ankle and joints of unspecified foot: Secondary | ICD-10-CM | POA: Diagnosis not present

## 2015-01-01 DIAGNOSIS — M1990 Unspecified osteoarthritis, unspecified site: Secondary | ICD-10-CM | POA: Diagnosis not present

## 2015-01-01 DIAGNOSIS — M79609 Pain in unspecified limb: Secondary | ICD-10-CM | POA: Diagnosis not present

## 2015-01-09 DIAGNOSIS — J432 Centrilobular emphysema: Secondary | ICD-10-CM | POA: Diagnosis not present

## 2015-01-09 DIAGNOSIS — R911 Solitary pulmonary nodule: Secondary | ICD-10-CM | POA: Diagnosis not present

## 2015-01-09 DIAGNOSIS — R05 Cough: Secondary | ICD-10-CM | POA: Diagnosis not present

## 2015-01-09 DIAGNOSIS — E872 Acidosis: Secondary | ICD-10-CM | POA: Diagnosis not present

## 2015-01-11 DIAGNOSIS — J449 Chronic obstructive pulmonary disease, unspecified: Secondary | ICD-10-CM | POA: Diagnosis not present

## 2015-01-18 ENCOUNTER — Other Ambulatory Visit: Payer: Self-pay | Admitting: Specialist

## 2015-01-18 DIAGNOSIS — E872 Acidosis: Secondary | ICD-10-CM

## 2015-01-18 DIAGNOSIS — E8729 Other acidosis: Secondary | ICD-10-CM

## 2015-01-23 ENCOUNTER — Ambulatory Visit: Payer: Medicare Other | Attending: Specialist

## 2015-01-23 DIAGNOSIS — E872 Acidosis: Secondary | ICD-10-CM | POA: Diagnosis not present

## 2015-01-23 DIAGNOSIS — E8729 Other acidosis: Secondary | ICD-10-CM

## 2015-01-23 LAB — BLOOD GAS, ARTERIAL
ALLENS TEST (PASS/FAIL): POSITIVE — AB
Acid-Base Excess: 9.5 mmol/L — ABNORMAL HIGH (ref 0.0–3.0)
Bicarbonate: 36.5 mEq/L — ABNORMAL HIGH (ref 21.0–28.0)
FIO2: 0.28 %
O2 SAT: 96.7 %
PCO2 ART: 59 mmHg — AB (ref 32.0–48.0)
PH ART: 7.4 (ref 7.350–7.450)
pO2, Arterial: 60 mmHg — ABNORMAL LOW (ref 83.0–108.0)

## 2015-01-31 DIAGNOSIS — M79609 Pain in unspecified limb: Secondary | ICD-10-CM | POA: Diagnosis not present

## 2015-01-31 DIAGNOSIS — M549 Dorsalgia, unspecified: Secondary | ICD-10-CM | POA: Diagnosis not present

## 2015-01-31 DIAGNOSIS — Z79899 Other long term (current) drug therapy: Secondary | ICD-10-CM | POA: Diagnosis not present

## 2015-01-31 DIAGNOSIS — G894 Chronic pain syndrome: Secondary | ICD-10-CM | POA: Diagnosis not present

## 2015-01-31 DIAGNOSIS — M1990 Unspecified osteoarthritis, unspecified site: Secondary | ICD-10-CM | POA: Diagnosis not present

## 2015-02-01 DIAGNOSIS — J40 Bronchitis, not specified as acute or chronic: Secondary | ICD-10-CM | POA: Diagnosis not present

## 2015-02-01 DIAGNOSIS — J449 Chronic obstructive pulmonary disease, unspecified: Secondary | ICD-10-CM | POA: Diagnosis not present

## 2015-02-01 DIAGNOSIS — E039 Hypothyroidism, unspecified: Secondary | ICD-10-CM | POA: Diagnosis not present

## 2015-02-01 DIAGNOSIS — R7981 Abnormal blood-gas level: Secondary | ICD-10-CM | POA: Diagnosis not present

## 2015-02-01 DIAGNOSIS — R4182 Altered mental status, unspecified: Secondary | ICD-10-CM | POA: Diagnosis not present

## 2015-02-01 DIAGNOSIS — R5383 Other fatigue: Secondary | ICD-10-CM | POA: Diagnosis not present

## 2015-02-01 DIAGNOSIS — E780 Pure hypercholesterolemia: Secondary | ICD-10-CM | POA: Diagnosis not present

## 2015-02-11 DIAGNOSIS — J449 Chronic obstructive pulmonary disease, unspecified: Secondary | ICD-10-CM | POA: Diagnosis not present

## 2015-02-26 DIAGNOSIS — J449 Chronic obstructive pulmonary disease, unspecified: Secondary | ICD-10-CM | POA: Diagnosis not present

## 2015-03-06 DIAGNOSIS — K219 Gastro-esophageal reflux disease without esophagitis: Secondary | ICD-10-CM | POA: Diagnosis not present

## 2015-03-06 DIAGNOSIS — R351 Nocturia: Secondary | ICD-10-CM | POA: Diagnosis not present

## 2015-03-06 DIAGNOSIS — F064 Anxiety disorder due to known physiological condition: Secondary | ICD-10-CM | POA: Diagnosis not present

## 2015-03-13 DIAGNOSIS — J449 Chronic obstructive pulmonary disease, unspecified: Secondary | ICD-10-CM | POA: Diagnosis not present

## 2015-03-29 DIAGNOSIS — J439 Emphysema, unspecified: Secondary | ICD-10-CM | POA: Diagnosis not present

## 2015-03-29 DIAGNOSIS — J432 Centrilobular emphysema: Secondary | ICD-10-CM | POA: Diagnosis not present

## 2015-04-05 DIAGNOSIS — H5203 Hypermetropia, bilateral: Secondary | ICD-10-CM | POA: Diagnosis not present

## 2015-04-05 DIAGNOSIS — M1990 Unspecified osteoarthritis, unspecified site: Secondary | ICD-10-CM | POA: Diagnosis not present

## 2015-04-05 DIAGNOSIS — Z79899 Other long term (current) drug therapy: Secondary | ICD-10-CM | POA: Diagnosis not present

## 2015-04-05 DIAGNOSIS — M549 Dorsalgia, unspecified: Secondary | ICD-10-CM | POA: Diagnosis not present

## 2015-04-05 DIAGNOSIS — H2513 Age-related nuclear cataract, bilateral: Secondary | ICD-10-CM | POA: Diagnosis not present

## 2015-04-05 DIAGNOSIS — H524 Presbyopia: Secondary | ICD-10-CM | POA: Diagnosis not present

## 2015-04-05 DIAGNOSIS — G894 Chronic pain syndrome: Secondary | ICD-10-CM | POA: Diagnosis not present

## 2015-04-05 DIAGNOSIS — H52223 Regular astigmatism, bilateral: Secondary | ICD-10-CM | POA: Diagnosis not present

## 2015-04-05 DIAGNOSIS — M25579 Pain in unspecified ankle and joints of unspecified foot: Secondary | ICD-10-CM | POA: Diagnosis not present

## 2015-04-13 DIAGNOSIS — J449 Chronic obstructive pulmonary disease, unspecified: Secondary | ICD-10-CM | POA: Diagnosis not present

## 2015-05-14 DIAGNOSIS — J449 Chronic obstructive pulmonary disease, unspecified: Secondary | ICD-10-CM | POA: Diagnosis not present

## 2015-05-28 DIAGNOSIS — M545 Low back pain: Secondary | ICD-10-CM | POA: Diagnosis not present

## 2015-05-28 DIAGNOSIS — M792 Neuralgia and neuritis, unspecified: Secondary | ICD-10-CM | POA: Diagnosis not present

## 2015-05-28 DIAGNOSIS — G894 Chronic pain syndrome: Secondary | ICD-10-CM | POA: Diagnosis not present

## 2015-05-28 DIAGNOSIS — Z79899 Other long term (current) drug therapy: Secondary | ICD-10-CM | POA: Diagnosis not present

## 2015-06-13 DIAGNOSIS — J449 Chronic obstructive pulmonary disease, unspecified: Secondary | ICD-10-CM | POA: Diagnosis not present

## 2015-06-25 ENCOUNTER — Ambulatory Visit
Admission: RE | Admit: 2015-06-25 | Discharge: 2015-06-25 | Disposition: A | Discharge status: Home / Self Care | Payer: Medicare Other | Source: Ambulatory Visit | Attending: Specialist | Admitting: Specialist

## 2015-06-25 ENCOUNTER — Other Ambulatory Visit: Payer: Self-pay | Admitting: Respiratory Therapy

## 2015-06-25 DIAGNOSIS — R0609 Other forms of dyspnea: Secondary | ICD-10-CM | POA: Diagnosis not present

## 2015-06-25 DIAGNOSIS — J449 Chronic obstructive pulmonary disease, unspecified: Secondary | ICD-10-CM

## 2015-06-25 DIAGNOSIS — R400 Somnolence: Secondary | ICD-10-CM | POA: Diagnosis not present

## 2015-06-25 DIAGNOSIS — E872 Acidosis: Secondary | ICD-10-CM | POA: Insufficient documentation

## 2015-06-25 DIAGNOSIS — J432 Centrilobular emphysema: Secondary | ICD-10-CM | POA: Diagnosis not present

## 2015-06-25 LAB — BLOOD GAS, ARTERIAL
ACID-BASE EXCESS: 6.8 mmol/L — AB (ref 0.0–3.0)
Allens test (pass/fail): POSITIVE — AB
Bicarbonate: 33.3 mEq/L — ABNORMAL HIGH (ref 21.0–28.0)
FIO2: 28
O2 SAT: 96.5 %
PCO2 ART: 55 mmHg — AB (ref 32.0–48.0)
Patient temperature: 37
pH, Arterial: 7.39 (ref 7.350–7.450)
pO2, Arterial: 87 mmHg (ref 83.0–108.0)

## 2015-06-26 DIAGNOSIS — Z79899 Other long term (current) drug therapy: Secondary | ICD-10-CM | POA: Diagnosis not present

## 2015-06-26 DIAGNOSIS — M1990 Unspecified osteoarthritis, unspecified site: Secondary | ICD-10-CM | POA: Diagnosis not present

## 2015-06-26 DIAGNOSIS — M792 Neuralgia and neuritis, unspecified: Secondary | ICD-10-CM | POA: Diagnosis not present

## 2015-06-26 DIAGNOSIS — G894 Chronic pain syndrome: Secondary | ICD-10-CM | POA: Diagnosis not present

## 2015-06-26 DIAGNOSIS — M545 Low back pain: Secondary | ICD-10-CM | POA: Diagnosis not present

## 2015-07-11 DIAGNOSIS — Z79899 Other long term (current) drug therapy: Secondary | ICD-10-CM | POA: Diagnosis not present

## 2015-07-11 DIAGNOSIS — M79606 Pain in leg, unspecified: Secondary | ICD-10-CM | POA: Diagnosis not present

## 2015-07-11 DIAGNOSIS — G894 Chronic pain syndrome: Secondary | ICD-10-CM | POA: Diagnosis not present

## 2015-07-14 DIAGNOSIS — J449 Chronic obstructive pulmonary disease, unspecified: Secondary | ICD-10-CM | POA: Diagnosis not present

## 2015-07-16 DIAGNOSIS — J432 Centrilobular emphysema: Secondary | ICD-10-CM | POA: Diagnosis not present

## 2015-07-31 ENCOUNTER — Other Ambulatory Visit: Payer: Self-pay | Admitting: Orthopedic Surgery

## 2015-07-31 DIAGNOSIS — S46011A Strain of muscle(s) and tendon(s) of the rotator cuff of right shoulder, initial encounter: Secondary | ICD-10-CM

## 2015-08-09 ENCOUNTER — Ambulatory Visit
Admission: RE | Admit: 2015-08-09 | Discharge: 2015-08-09 | Disposition: A | Discharge status: Home / Self Care | Payer: Medicare Other | Source: Ambulatory Visit | Attending: Orthopedic Surgery | Admitting: Orthopedic Surgery

## 2015-08-09 DIAGNOSIS — S46011A Strain of muscle(s) and tendon(s) of the rotator cuff of right shoulder, initial encounter: Secondary | ICD-10-CM

## 2015-12-07 ENCOUNTER — Encounter: Payer: Self-pay | Admitting: Emergency Medicine

## 2015-12-07 ENCOUNTER — Emergency Department: Payer: Medicare Other

## 2015-12-07 ENCOUNTER — Emergency Department
Admission: EM | Admit: 2015-12-07 | Discharge: 2015-12-07 | Disposition: A | Discharge status: Home / Self Care | Payer: Medicare Other | Attending: Emergency Medicine | Admitting: Emergency Medicine

## 2015-12-07 DIAGNOSIS — E785 Hyperlipidemia, unspecified: Secondary | ICD-10-CM | POA: Insufficient documentation

## 2015-12-07 DIAGNOSIS — Z87891 Personal history of nicotine dependence: Secondary | ICD-10-CM | POA: Insufficient documentation

## 2015-12-07 DIAGNOSIS — J440 Chronic obstructive pulmonary disease with acute lower respiratory infection: Secondary | ICD-10-CM | POA: Diagnosis not present

## 2015-12-07 DIAGNOSIS — M79604 Pain in right leg: Secondary | ICD-10-CM | POA: Diagnosis present

## 2015-12-07 DIAGNOSIS — Z79899 Other long term (current) drug therapy: Secondary | ICD-10-CM | POA: Diagnosis not present

## 2015-12-07 DIAGNOSIS — R609 Edema, unspecified: Secondary | ICD-10-CM | POA: Diagnosis not present

## 2015-12-07 DIAGNOSIS — L03115 Cellulitis of right lower limb: Secondary | ICD-10-CM

## 2015-12-07 DIAGNOSIS — Z792 Long term (current) use of antibiotics: Secondary | ICD-10-CM | POA: Insufficient documentation

## 2015-12-07 DIAGNOSIS — F329 Major depressive disorder, single episode, unspecified: Secondary | ICD-10-CM | POA: Insufficient documentation

## 2015-12-07 DIAGNOSIS — Z7952 Long term (current) use of systemic steroids: Secondary | ICD-10-CM | POA: Insufficient documentation

## 2015-12-07 DIAGNOSIS — L539 Erythematous condition, unspecified: Secondary | ICD-10-CM | POA: Diagnosis not present

## 2015-12-07 LAB — CBC WITH DIFFERENTIAL/PLATELET
BASOS PCT: 1 %
Basophils Absolute: 0.1 10*3/uL (ref 0–0.1)
EOS ABS: 0.2 10*3/uL (ref 0–0.7)
EOS PCT: 3 %
HCT: 33.3 % — ABNORMAL LOW (ref 35.0–47.0)
HEMOGLOBIN: 11.2 g/dL — AB (ref 12.0–16.0)
LYMPHS ABS: 1.6 10*3/uL (ref 1.0–3.6)
Lymphocytes Relative: 30 %
MCH: 29.5 pg (ref 26.0–34.0)
MCHC: 33.5 g/dL (ref 32.0–36.0)
MCV: 87.9 fL (ref 80.0–100.0)
MONO ABS: 0.4 10*3/uL (ref 0.2–0.9)
MONOS PCT: 7 %
NEUTROS PCT: 59 %
Neutro Abs: 3.2 10*3/uL (ref 1.4–6.5)
PLATELETS: 313 10*3/uL (ref 150–440)
RBC: 3.79 MIL/uL — ABNORMAL LOW (ref 3.80–5.20)
RDW: 14.7 % — AB (ref 11.5–14.5)
WBC: 5.4 10*3/uL (ref 3.6–11.0)

## 2015-12-07 LAB — COMPREHENSIVE METABOLIC PANEL
ALK PHOS: 65 U/L (ref 38–126)
ALT: 11 U/L — AB (ref 14–54)
AST: 17 U/L (ref 15–41)
Albumin: 3.6 g/dL (ref 3.5–5.0)
Anion gap: 6 (ref 5–15)
BUN: 7 mg/dL (ref 6–20)
CALCIUM: 8.9 mg/dL (ref 8.9–10.3)
CHLORIDE: 100 mmol/L — AB (ref 101–111)
CO2: 33 mmol/L — AB (ref 22–32)
CREATININE: 0.72 mg/dL (ref 0.44–1.00)
GFR calc non Af Amer: >60 mL/min (ref >60)
GLUCOSE: 95 mg/dL (ref 65–99)
Potassium: 4.2 mmol/L (ref 3.5–5.1)
SODIUM: 139 mmol/L (ref 135–145)
Total Bilirubin: 0.5 mg/dL (ref 0.3–1.2)
Total Protein: 7.1 g/dL (ref 6.5–8.1)

## 2015-12-07 MED ORDER — SULFAMETHOXAZOLE-TRIMETHOPRIM 800-160 MG PO TABS: 1.0000 | ORAL_TABLET | Freq: Two times a day (BID) | ORAL | Status: DC

## 2015-12-07 MED ORDER — IOPAMIDOL (ISOVUE-300) INJECTION 61%
100.0000 mL | Freq: Once | INTRAVENOUS | Status: AC | PRN
  Administered 2015-12-07: 100 mL via INTRAVENOUS
  Filled 2015-12-07: qty 100

## 2015-12-07 NOTE — Discharge Instructions (Signed)

## 2015-12-07 NOTE — ED Notes (Signed)
Pt to ed with c/o right lower leg pain, swelling and redness x 1 week.
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
## 2016-01-16 ENCOUNTER — Telehealth: Payer: Self-pay | Admitting: *Deleted

## 2016-01-16 NOTE — Telephone Encounter (Signed)
Please disregard the first message do not call the pt she's still in review to see a dr here...thanks
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
## 2016-12-15 ENCOUNTER — Encounter: Payer: Self-pay | Admitting: Primary Care

## 2016-12-15 ENCOUNTER — Ambulatory Visit (INDEPENDENT_AMBULATORY_CARE_PROVIDER_SITE_OTHER): Payer: Medicare Other | Admitting: Primary Care

## 2016-12-15 VITALS — BP 120/70 | HR 99 | Temp 98.2°F | Ht 65.0 in | Wt 181.4 lb

## 2016-12-15 DIAGNOSIS — E785 Hyperlipidemia, unspecified: Secondary | ICD-10-CM | POA: Diagnosis not present

## 2016-12-15 DIAGNOSIS — J449 Chronic obstructive pulmonary disease, unspecified: Secondary | ICD-10-CM

## 2016-12-15 DIAGNOSIS — F411 Generalized anxiety disorder: Secondary | ICD-10-CM

## 2016-12-15 DIAGNOSIS — F329 Major depressive disorder, single episode, unspecified: Secondary | ICD-10-CM | POA: Diagnosis not present

## 2016-12-15 DIAGNOSIS — K5909 Other constipation: Secondary | ICD-10-CM

## 2016-12-15 DIAGNOSIS — F32A Depression, unspecified: Secondary | ICD-10-CM

## 2016-12-15 DIAGNOSIS — L03119 Cellulitis of unspecified part of limb: Secondary | ICD-10-CM

## 2016-12-15 DIAGNOSIS — G8929 Other chronic pain: Secondary | ICD-10-CM

## 2016-12-15 DIAGNOSIS — F419 Anxiety disorder, unspecified: Secondary | ICD-10-CM

## 2016-12-15 MED ORDER — FLUOXETINE HCL 20 MG PO TABS: ORAL_TABLET | ORAL | 0 refills | Status: DC

## 2016-12-15 MED ORDER — HYDROXYZINE HCL 25 MG PO TABS: ORAL_TABLET | ORAL | 0 refills | Status: DC
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
## 2016-12-15 NOTE — Patient Instructions (Signed)
Stop taking alprazolam (Xanax). Instead, try hydroxyzine 25 mg tablets for anxiety and sleep. Take 1/2 to 1 tablet by mouth no more than twice daily AS NEEDED for anxiety or sleep.  Start fluoxetine 20 mg tablets for anxiety and depression. Take 1/2 tablet by mouth once daily for 8 days, then increase to 1 full tablet thereafter.  Do not use the furosemide 20 mg tablets unless you get a lot of leg swelling. in this case, call me before you take this medication.  You may take Zantac or Omeprazole as needed for heartburn, try the Zantac first.  Please schedule a follow up visit in 6 weeks for re-evaluation of anxiety and depression.  It was a pleasure to meet you today! Please don't hesitate to call me with any questions. Welcome to [facility]!

## 2016-12-15 NOTE — Progress Notes (Signed)
Pre visit review using our clinic review tool, if applicable. No additional management support is needed unless otherwise documented below in the visit note. 

## 2016-12-16 DIAGNOSIS — L03119 Cellulitis of unspecified part of limb: Secondary | ICD-10-CM | POA: Insufficient documentation

## 2016-12-16 DIAGNOSIS — G8929 Other chronic pain: Secondary | ICD-10-CM | POA: Insufficient documentation

## 2016-12-16 NOTE — Assessment & Plan Note (Signed)
Following with pulmonology, feels well managed. On continuous home oxygen.
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
## 2016-12-16 NOTE — Assessment & Plan Note (Signed)
Likely due to chronic opioid use. Discussed to reduce Amitiza to once weekly given that she's having 2-3 bowel movements daily on an every other day dose regimen.

## 2016-12-16 NOTE — Assessment & Plan Note (Signed)
Deteriorated with GAD 7 score of 16 and PHQ 9 score of 15 today. Long discussion today regarding use of Xanax and Ambien, discouraged use of both given history of falls.   Will restart Prozac at 10 mg x 8 days, then increase to 20 mg. Rx for hydroxyzine provided to use PRN for breakthrough anxiety and insomnia.  Follow up in 6 weeks for re-evaluation of anxiety and depression.

## 2016-12-16 NOTE — Assessment & Plan Note (Signed)
Will check records for recent lipid panel.

## 2016-12-16 NOTE — Assessment & Plan Note (Signed)
Managed on chronic clindamycin daily for 2 years. Will obtain records for review, continue for now. Discussed risks of long term antibiotic use, both patient and family reticent to discontinue given history of recurrent infection.

## 2017-01-26 ENCOUNTER — Ambulatory Visit (INDEPENDENT_AMBULATORY_CARE_PROVIDER_SITE_OTHER): Payer: Medicare Other | Admitting: Primary Care

## 2017-01-26 ENCOUNTER — Encounter: Payer: Self-pay | Admitting: Primary Care

## 2017-01-26 VITALS — BP 140/80 | HR 82 | Temp 98.9°F | Ht 65.0 in | Wt 179.8 lb

## 2017-01-26 DIAGNOSIS — F32A Depression, unspecified: Secondary | ICD-10-CM

## 2017-01-26 DIAGNOSIS — F419 Anxiety disorder, unspecified: Secondary | ICD-10-CM | POA: Diagnosis not present

## 2017-01-26 DIAGNOSIS — F329 Major depressive disorder, single episode, unspecified: Secondary | ICD-10-CM

## 2017-01-26 DIAGNOSIS — R251 Tremor, unspecified: Secondary | ICD-10-CM | POA: Diagnosis not present

## 2017-01-26 DIAGNOSIS — G4700 Insomnia, unspecified: Secondary | ICD-10-CM | POA: Diagnosis not present

## 2017-01-26 MED ORDER — TRAZODONE HCL 50 MG PO TABS: 50.0000 mg | ORAL_TABLET | Freq: Every evening | ORAL | 0 refills | Status: DC | PRN

## 2017-01-26 NOTE — Assessment & Plan Note (Addendum)
Overall improved on Prozac, will continue this. Failed OTC treatments for insomnia, do not think she is a good candidate for Ambien given history of falls and also now that she's on narcotics for chronic pain. Will trial Trazodone 50-100 mg HS. Will call in 2 weeks for an update. Consider Remeron if no improvement.

## 2017-01-26 NOTE — Assessment & Plan Note (Signed)
Failed OTC treatments for insomnia, do not think she is a good candidate for Ambien given history of falls and also now that she's on narcotics for chronic pain. Will trial Trazodone 50-100 mg HS. Will call in 2 weeks for an update. Consider Remeron if no improvement
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
## 2017-01-26 NOTE — Patient Instructions (Signed)
Try Trazodone 50 mg tablets for insomnia. Take 1 tablet by mouth at bedtime as needed. May advance to 2 tablets if needed. I'll call you in a few weeks for an update.  I may send you to the Neurologist for evaluation of your tremors, let me take a look at your medications first.  Continue fluoxetine 20 mg (Prozac) for anxiety.   It was a pleasure to see you today!

## 2017-01-26 NOTE — Assessment & Plan Note (Signed)
To bilateral hands, feet, and head x 1 year. Could be medications, will check for potential cause through her meds. Consider neurology referral for further evaluation.

## 2017-02-06 ENCOUNTER — Telehealth: Payer: Self-pay | Admitting: Primary Care

## 2017-02-06 DIAGNOSIS — L0390 Cellulitis, unspecified: Secondary | ICD-10-CM

## 2017-02-06 NOTE — Telephone Encounter (Signed)
Received faxed refill request for clindamycin (CLEOCIN) 300 MG capsule.  Medication have not been prescribed by Anda Kraft.  Last seen on 01/26/2017

## 2017-02-06 NOTE — Telephone Encounter (Signed)
Please call patient and notify her that chronic use of antibiotics can cause bad effects such as infections to the colon that can cause death. I strongly recommend she try not using the Clindamycin for a period of time and closely monitor.

## 2017-02-09 ENCOUNTER — Telehealth: Payer: Self-pay | Admitting: Primary Care

## 2017-02-09 MED ORDER — CLINDAMYCIN HCL 300 MG PO CAPS: 300.0000 mg | ORAL_CAPSULE | Freq: Every day | ORAL | 0 refills | Status: DC

## 2017-02-09 NOTE — Telephone Encounter (Signed)
-----   Message from Pleas Koch, NP sent at 01/26/2017  5:22 PM EDT ----- Regarding: Insomnia Please check on her insomnia since we added Trazodone. Any improvement?

## 2017-02-09 NOTE — Telephone Encounter (Signed)
Patient's son, Darnelle Maffucci (per [facility]) stated that patient did mention that the Trazodone help improve her sleep.

## 2017-02-09 NOTE — Telephone Encounter (Signed)
Noted, will continue Trazodone.

## 2017-02-09 NOTE — Telephone Encounter (Signed)
Spoken to patient's son, Debra [name] (Per DPR) since patient is taking a nap. He stated that patient has been waiting for this refill.  Patient's son stated that they are aware of long term use of antibiotics. They are more concern and afraid of possible infection to the bone if they did not catch it on time. Patient's son stated they first given this in the hospital and patient has been doing well ever since. They would like to continue as patient have been taking it for years.

## 2017-02-09 NOTE — Telephone Encounter (Signed)
Noted, refill sent. Discussed this case with supervising physician who advised to continue with daily clindamycin as she's taken it for years. If this loses it's effectiveness then we may consider infectious disease consultation.

## 2017-03-06 ENCOUNTER — Other Ambulatory Visit: Payer: Self-pay | Admitting: Primary Care

## 2017-03-06 NOTE — Telephone Encounter (Signed)
Request denied. Will not be refilling or prescribing Xanax given high risk for interaction with other medications.

## 2017-03-06 NOTE — Telephone Encounter (Signed)
Ok to refill? Electronically refill request for Alprazolam 1 mg tablet.  Medication is not on current medication list.

## 2017-03-09 ENCOUNTER — Other Ambulatory Visit: Payer: Self-pay | Admitting: Primary Care

## 2017-03-09 DIAGNOSIS — R251 Tremor, unspecified: Secondary | ICD-10-CM

## 2017-03-09 DIAGNOSIS — G4700 Insomnia, unspecified: Secondary | ICD-10-CM

## 2017-03-09 DIAGNOSIS — E2839 Other primary ovarian failure: Secondary | ICD-10-CM

## 2017-03-09 NOTE — Telephone Encounter (Signed)
Ok to refill? Electronically refill request for traZODone (DESYREL) 50 MG tablet.  Last prescribed on 01/26/2017 Last seen on 01/26/2017

## 2017-03-09 NOTE — Telephone Encounter (Signed)
How's she doing on trazodone? Would she like a refill? Also, I recommend she start taking the Clindamycin antibiotics every other day rather than everyday. I've spoken with several other providers who don't recommend daily use of the medication. I'd like for her to do every other day for the next month and have her closely monitor her leg.

## 2017-03-10 ENCOUNTER — Other Ambulatory Visit: Payer: Self-pay | Admitting: Primary Care

## 2017-03-10 DIAGNOSIS — G4700 Insomnia, unspecified: Secondary | ICD-10-CM

## 2017-03-10 MED ORDER — TRAZODONE HCL 50 MG PO TABS: 50.0000 mg | ORAL_TABLET | Freq: Every evening | ORAL | 0 refills | Status: DC | PRN

## 2017-03-10 NOTE — Telephone Encounter (Signed)
Spoken and notified patient's son of Kate's comments. He stated that patient is doing well. Yes, patient is requesting a refill of the Trazodone 50 mg and having to take 2 tablets.  Patient's son stated patient requested refill of Xanax last week and it was denied. I notified him for the reason for the denied. He is asking for patient to wean off Xanax.  Also he mention that Anda Kraft was going to go over patient's medication and see what would be causing her tremors. They also thought, there was going to a referral neurologist.

## 2017-03-10 NOTE — Telephone Encounter (Signed)
V/M left requesting cb why medication is not being refilled.

## 2017-03-10 NOTE — Telephone Encounter (Signed)
Will send refills for Trazodone to her pharmacy.  Please apologize, I was under the impression that she was no longer taking Xanax. How long has she been without the medication? How often was she taking the Xanax? What strength?  Fluoxetine (Prozac), roflumilast, could cause tremors. How long has she been on Prozac?  Also remember that I would like for her to take the clindamycin every other day from here on out.

## 2017-03-11 NOTE — Telephone Encounter (Signed)
Patient ran out already for a few weeks now.  Spoken and notified patient of Kate's comments. Patient verbalized understanding.

## 2017-03-11 NOTE — Telephone Encounter (Signed)
Spoken to patient. Patient stated that the Xanax was first prescribed by Delia Chimes because she had a couple of death in the family and other issues. Xanax has also been helping with the tremor. Patient has been taking Xanax 1 mg every other day.  Patient stated that she is not sure but she asked her husband who stated patient has been taking at least a couple of years.  Patient also stated she would like to get her bone density done.

## 2017-03-11 NOTE — Telephone Encounter (Signed)
Is she still taking Xanax every other day or did she run out? If she ran out then when? I can send her to Neurology for further evaluation if she'd like. Let me know. I placed an order for her bone density test. Someone should call her soon, if not hen have her call us in 2 weeks if she's not heard anything.

## 2017-03-11 NOTE — Telephone Encounter (Signed)
If she's been out of her Xanax for several weeks then it's already out of her system. Does she want to see neurology??

## 2017-03-12 NOTE — Telephone Encounter (Signed)
Patient returned your call. Patient can be reached at [phone].

## 2017-03-12 NOTE — Telephone Encounter (Signed)
Patient stated that she would like a refill due to her tremors, it really helps when she has a bad tremor. She also stated that the tremor are not bad enough to go see a neurology, she wants to wait on it.

## 2017-03-12 NOTE — Telephone Encounter (Signed)
Please notify patient that I will not be refilling her Xanax as this is not the best treatment for tremors. We can try a low dose medication called propranolol to help with tremors. This is a better medication for treatment of tremors. If she's interested let me know, otherwise we can send her to neurology.

## 2017-03-12 NOTE — Telephone Encounter (Signed)
Message left for patient to return my call.  

## 2017-03-12 NOTE — Telephone Encounter (Signed)
Electronic refill request. Alprazolam Last office visit:   01/26/17 Last Filled:   ? Not on patient's current meds list. Please advise.

## 2017-03-13 NOTE — Telephone Encounter (Signed)
Message left for patient to return my call.  

## 2017-03-13 NOTE — Telephone Encounter (Signed)
Spoken and notified patient of Kate's comments. Patient stated she would like to try the propranolol as suggested first.  Patient stated that she is having a lot of anxiety right now since her daughter is going to have surgery soon. I offer patient to come in and discuss with Anda Kraft. Patient stated that she will after her daughter's surgery.

## 2017-03-16 MED ORDER — PROPRANOLOL HCL 40 MG PO TABS: ORAL_TABLET | ORAL | 1 refills | Status: DC

## 2017-03-16 NOTE — Telephone Encounter (Signed)
Spoken and notified patient of Kate's comments. Patient verbalized understanding.  Patient stated that she will call back to schedule the follow up appt.

## 2017-03-16 NOTE — Telephone Encounter (Signed)
Please notify patient that I've sent in a prescription for propranolol 40 mg tablets to treat her tremors. Take 1 tablet by mouth twice daily. Please also have her monitor her blood pressure and report readings at or below 100/60. This may also help with her anxiety as well. I'd like to see her back in the office in 4 weeks for re-evaluation.

## 2017-03-19 ENCOUNTER — Telehealth: Payer: Self-pay | Admitting: *Deleted

## 2017-03-19 DIAGNOSIS — R110 Nausea: Secondary | ICD-10-CM

## 2017-03-19 NOTE — Telephone Encounter (Addendum)
Please notify patient that we got a refill request for ondansetron which is not on her current medication list. Does somebody else prescribed this medication for her? If so have her send the request of them. Who is prescribing this? How often is she taking?

## 2017-03-19 NOTE — Telephone Encounter (Signed)
Left message on patient's voicemail to return call

## 2017-03-19 NOTE — Telephone Encounter (Signed)
Faxed refill request.  Ondansetron Medication is not on patient's current meds list.  Please advise.

## 2017-03-20 MED ORDER — ONDANSETRON 4 MG PO TBDP: 4.0000 mg | ORAL_TABLET | Freq: Three times a day (TID) | ORAL | 0 refills | Status: DC | PRN

## 2017-03-20 NOTE — Addendum Note (Signed)
Addended by: Pleas Koch on: 03/20/2017 12:23 PM   Modules accepted: Orders

## 2017-03-20 NOTE — Telephone Encounter (Signed)
Noted, Rx sent to pharmacy. Remind her that this is an "as needed" medication that is to be used sparingly.

## 2017-03-20 NOTE — Telephone Encounter (Signed)
Left detailed message.   

## 2017-03-20 NOTE — Telephone Encounter (Signed)
Best number [phone] If pt is not there you can speak with her spouse Legrand Como)

## 2017-03-20 NOTE — Telephone Encounter (Signed)
Pt returned lugene's call Pt stated susan fuller prescribed this med for her She stated it helps with her nausea Every 6 hours as needed   (sometimes)

## 2017-03-26 ENCOUNTER — Telehealth: Payer: Self-pay

## 2017-03-26 NOTE — Telephone Encounter (Signed)
Noted. Will discontinue the propranolol for treatment of tremors. Rx initiated given her complaints of tremors. Seems like tremors have improved.

## 2017-03-26 NOTE — Telephone Encounter (Signed)
Pt said she was seen 01/26/17; the med at drug store (pt does not know name of med) pt does not want to take the medication for tremors and elevated BP for fear of possible side effects.pt states she is not having tremors and pts hands are fine.pt says there is nothing wrong with her BP and pt understands med may lower BP. Pt is afraid to take med. Pt does not have a way to take BP at pts home. Pt request cb. Katherina Right

## 2017-03-27 ENCOUNTER — Other Ambulatory Visit: Payer: Self-pay | Admitting: Primary Care

## 2017-04-15 ENCOUNTER — Other Ambulatory Visit: Payer: Self-pay | Admitting: Primary Care

## 2017-04-15 DIAGNOSIS — Z1231 Encounter for screening mammogram for malignant neoplasm of breast: Secondary | ICD-10-CM

## 2017-05-11 ENCOUNTER — Other Ambulatory Visit: Payer: Self-pay | Admitting: Primary Care

## 2017-05-11 DIAGNOSIS — G4700 Insomnia, unspecified: Secondary | ICD-10-CM

## 2017-05-11 DIAGNOSIS — L0390 Cellulitis, unspecified: Secondary | ICD-10-CM

## 2017-05-11 NOTE — Telephone Encounter (Signed)
Refill of trazodone sent to pharmacy. She should have been taking the clindamycin every other day, if she actually needing a refill or was this an automated refill? Please confirm how often she was taking.

## 2017-05-11 NOTE — Telephone Encounter (Signed)
Ok to refill? Electronically refill request for   clindamycin (CLEOCIN) 300 MG capsule - Last prescribed on 02/09/2017  traZODone (DESYREL) 50 MG tablet - Last prescribed on 03/10/2017  Last seen on 01/26/2017

## 2017-05-12 NOTE — Telephone Encounter (Signed)
[employer] and canceled the Rx

## 2017-05-12 NOTE — Telephone Encounter (Signed)
Spoken to patient and she stated that she does not needed refill. She have found a new provider and will not be coming to this office.

## 2017-05-12 NOTE — Telephone Encounter (Signed)
Noted, please call and cancel the trazodone refill.

## 2017-05-21 ENCOUNTER — Ambulatory Visit
Admission: RE | Admit: 2017-05-21 | Discharge: 2017-05-21 | Disposition: A | Discharge status: Home / Self Care | Payer: Medicare Other | Source: Ambulatory Visit | Attending: Primary Care | Admitting: Primary Care

## 2017-05-21 DIAGNOSIS — E2839 Other primary ovarian failure: Secondary | ICD-10-CM | POA: Insufficient documentation

## 2017-05-21 DIAGNOSIS — M810 Age-related osteoporosis without current pathological fracture: Secondary | ICD-10-CM | POA: Insufficient documentation

## 2017-05-21 DIAGNOSIS — Z1231 Encounter for screening mammogram for malignant neoplasm of breast: Secondary | ICD-10-CM

## 2017-12-16 ENCOUNTER — Emergency Department: Payer: Medicare Other

## 2017-12-16 ENCOUNTER — Inpatient Hospital Stay
Admission: EM | Admit: 2017-12-16 | Discharge: 2017-12-18 | DRG: 190 | Disposition: A | Discharge status: Home / Self Care | Payer: Medicare Other | Attending: Internal Medicine | Admitting: Internal Medicine

## 2017-12-16 ENCOUNTER — Other Ambulatory Visit: Payer: Self-pay

## 2017-12-16 ENCOUNTER — Encounter: Payer: Self-pay | Admitting: *Deleted

## 2017-12-16 DIAGNOSIS — Z825 Family history of asthma and other chronic lower respiratory diseases: Secondary | ICD-10-CM

## 2017-12-16 DIAGNOSIS — R0689 Other abnormalities of breathing: Secondary | ICD-10-CM

## 2017-12-16 DIAGNOSIS — J440 Chronic obstructive pulmonary disease with acute lower respiratory infection: Secondary | ICD-10-CM | POA: Diagnosis present

## 2017-12-16 DIAGNOSIS — R443 Hallucinations, unspecified: Secondary | ICD-10-CM | POA: Diagnosis present

## 2017-12-16 DIAGNOSIS — Z87891 Personal history of nicotine dependence: Secondary | ICD-10-CM

## 2017-12-16 DIAGNOSIS — Z79899 Other long term (current) drug therapy: Secondary | ICD-10-CM

## 2017-12-16 DIAGNOSIS — J9622 Acute and chronic respiratory failure with hypercapnia: Secondary | ICD-10-CM | POA: Diagnosis present

## 2017-12-16 DIAGNOSIS — M79606 Pain in leg, unspecified: Secondary | ICD-10-CM | POA: Diagnosis present

## 2017-12-16 DIAGNOSIS — J441 Chronic obstructive pulmonary disease with (acute) exacerbation: Secondary | ICD-10-CM | POA: Diagnosis present

## 2017-12-16 DIAGNOSIS — E872 Acidosis: Secondary | ICD-10-CM | POA: Diagnosis present

## 2017-12-16 DIAGNOSIS — G478 Other sleep disorders: Secondary | ICD-10-CM | POA: Diagnosis present

## 2017-12-16 DIAGNOSIS — J189 Pneumonia, unspecified organism: Secondary | ICD-10-CM | POA: Diagnosis present

## 2017-12-16 DIAGNOSIS — I10 Essential (primary) hypertension: Secondary | ICD-10-CM | POA: Diagnosis present

## 2017-12-16 DIAGNOSIS — Z7951 Long term (current) use of inhaled steroids: Secondary | ICD-10-CM

## 2017-12-16 DIAGNOSIS — R0600 Dyspnea, unspecified: Secondary | ICD-10-CM

## 2017-12-16 DIAGNOSIS — G8929 Other chronic pain: Secondary | ICD-10-CM | POA: Diagnosis present

## 2017-12-16 DIAGNOSIS — D649 Anemia, unspecified: Secondary | ICD-10-CM | POA: Diagnosis present

## 2017-12-16 DIAGNOSIS — M8580 Other specified disorders of bone density and structure, unspecified site: Secondary | ICD-10-CM | POA: Diagnosis present

## 2017-12-16 DIAGNOSIS — F329 Major depressive disorder, single episode, unspecified: Secondary | ICD-10-CM | POA: Diagnosis present

## 2017-12-16 DIAGNOSIS — G9349 Other encephalopathy: Secondary | ICD-10-CM | POA: Diagnosis present

## 2017-12-16 DIAGNOSIS — F419 Anxiety disorder, unspecified: Secondary | ICD-10-CM | POA: Diagnosis present

## 2017-12-16 DIAGNOSIS — I4891 Unspecified atrial fibrillation: Secondary | ICD-10-CM | POA: Diagnosis present

## 2017-12-16 DIAGNOSIS — Z818 Family history of other mental and behavioral disorders: Secondary | ICD-10-CM | POA: Diagnosis not present

## 2017-12-16 DIAGNOSIS — Z888 Allergy status to other drugs, medicaments and biological substances status: Secondary | ICD-10-CM

## 2017-12-16 DIAGNOSIS — Z8249 Family history of ischemic heart disease and other diseases of the circulatory system: Secondary | ICD-10-CM

## 2017-12-16 DIAGNOSIS — R4182 Altered mental status, unspecified: Secondary | ICD-10-CM

## 2017-12-16 DIAGNOSIS — Z9102 Food additives allergy status: Secondary | ICD-10-CM

## 2017-12-16 DIAGNOSIS — E785 Hyperlipidemia, unspecified: Secondary | ICD-10-CM | POA: Diagnosis present

## 2017-12-16 DIAGNOSIS — J9621 Acute and chronic respiratory failure with hypoxia: Secondary | ICD-10-CM | POA: Diagnosis present

## 2017-12-16 DIAGNOSIS — K5909 Other constipation: Secondary | ICD-10-CM | POA: Diagnosis present

## 2017-12-16 DIAGNOSIS — I739 Peripheral vascular disease, unspecified: Secondary | ICD-10-CM | POA: Diagnosis present

## 2017-12-16 DIAGNOSIS — Z79891 Long term (current) use of opiate analgesic: Secondary | ICD-10-CM

## 2017-12-16 HISTORY — DX: Chronic obstructive pulmonary disease, unspecified: J44.9

## 2017-12-16 LAB — BLOOD GAS, VENOUS
ACID-BASE EXCESS: 13.7 mmol/L — AB (ref 0.0–2.0)
BICARBONATE: 43.8 mmol/L — AB (ref 20.0–28.0)
PATIENT TEMPERATURE: 37
PCO2 VEN: 91 mmHg — AB (ref 44.0–60.0)
PH VEN: 7.29 (ref 7.250–7.430)

## 2017-12-16 LAB — COMPREHENSIVE METABOLIC PANEL
ALBUMIN: 3.7 g/dL (ref 3.5–5.0)
ALK PHOS: 86 U/L (ref 38–126)
ALT: 17 U/L (ref 14–54)
ANION GAP: 7 (ref 5–15)
AST: 27 U/L (ref 15–41)
BILIRUBIN TOTAL: 0.4 mg/dL (ref 0.3–1.2)
BUN: 7 mg/dL (ref 6–20)
CALCIUM: 9 mg/dL (ref 8.9–10.3)
CO2: 37 mmol/L — ABNORMAL HIGH (ref 22–32)
Chloride: 96 mmol/L — ABNORMAL LOW (ref 101–111)
Creatinine, Ser: 0.55 mg/dL (ref 0.44–1.00)
GFR calc Af Amer: >60 mL/min (ref >60)
GFR calc non Af Amer: >60 mL/min (ref >60)
GLUCOSE: 124 mg/dL — AB (ref 65–99)
POTASSIUM: 4.3 mmol/L (ref 3.5–5.1)
Sodium: 140 mmol/L (ref 135–145)
TOTAL PROTEIN: 7.6 g/dL (ref 6.5–8.1)

## 2017-12-16 LAB — CBC
HEMATOCRIT: 33.6 % — AB (ref 35.0–47.0)
HEMOGLOBIN: 11.1 g/dL — AB (ref 12.0–16.0)
MCH: 28.6 pg (ref 26.0–34.0)
MCHC: 33.1 g/dL (ref 32.0–36.0)
MCV: 86.3 fL (ref 80.0–100.0)
Platelets: 308 10*3/uL (ref 150–440)
RBC: 3.89 MIL/uL (ref 3.80–5.20)
RDW: 16.6 % — ABNORMAL HIGH (ref 11.5–14.5)
WBC: 22.9 10*3/uL — AB (ref 3.6–11.0)

## 2017-12-16 LAB — PROCALCITONIN: PROCALCITONIN: 0.13 ng/mL

## 2017-12-16 MED ORDER — ACETAMINOPHEN 325 MG PO TABS
650.0000 mg | ORAL_TABLET | Freq: Four times a day (QID) | ORAL | Status: DC | PRN
  Administered 2017-12-17: 650 mg via ORAL
  Filled 2017-12-16: qty 2

## 2017-12-16 MED ORDER — MOMETASONE FURO-FORMOTEROL FUM 200-5 MCG/ACT IN AERO: 2.0000 | INHALATION_SPRAY | Freq: Two times a day (BID) | RESPIRATORY_TRACT | Status: DC

## 2017-12-16 MED ORDER — SODIUM CHLORIDE 0.9 % IV SOLN
500.0000 mg | INTRAVENOUS | Status: DC
  Administered 2017-12-17 (×2): 500 mg via INTRAVENOUS
  Filled 2017-12-16 (×4): qty 500

## 2017-12-16 MED ORDER — METHYLPREDNISOLONE SODIUM SUCC 125 MG IJ SOLR
125.0000 mg | Freq: Once | INTRAMUSCULAR | Status: AC
  Administered 2017-12-16: 125 mg via INTRAVENOUS
  Filled 2017-12-16: qty 2

## 2017-12-16 MED ORDER — PREGABALIN 75 MG PO CAPS
150.0000 mg | ORAL_CAPSULE | Freq: Two times a day (BID) | ORAL | Status: DC
  Administered 2017-12-16 – 2017-12-18 (×4): 150 mg via ORAL
  Filled 2017-12-16 (×4): qty 2

## 2017-12-16 MED ORDER — ACETAMINOPHEN 650 MG RE SUPP: 650.0000 mg | Freq: Four times a day (QID) | RECTAL | Status: DC | PRN

## 2017-12-16 MED ORDER — FLUTICASONE FUROATE-VILANTEROL 200-25 MCG/INH IN AEPB
1.0000 | INHALATION_SPRAY | Freq: Every day | RESPIRATORY_TRACT | Status: DC
  Filled 2017-12-16: qty 28

## 2017-12-16 MED ORDER — OXYCODONE HCL 5 MG PO TABS
5.0000 mg | ORAL_TABLET | ORAL | Status: DC | PRN
  Administered 2017-12-17 – 2017-12-18 (×5): 5 mg via ORAL
  Filled 2017-12-16 (×5): qty 1

## 2017-12-16 MED ORDER — METHYLPREDNISOLONE SODIUM SUCC 125 MG IJ SOLR
60.0000 mg | Freq: Four times a day (QID) | INTRAMUSCULAR | Status: DC
  Administered 2017-12-16 – 2017-12-18 (×6): 60 mg via INTRAVENOUS
  Filled 2017-12-16 (×6): qty 2

## 2017-12-16 MED ORDER — SODIUM CHLORIDE 0.9 % IV SOLN
1.0000 g | INTRAVENOUS | Status: DC
  Administered 2017-12-16 – 2017-12-17 (×2): 1 g via INTRAVENOUS
  Filled 2017-12-16 (×4): qty 10

## 2017-12-16 MED ORDER — RISAQUAD PO CAPS
1.0000 | ORAL_CAPSULE | Freq: Every day | ORAL | Status: DC
  Administered 2017-12-17 – 2017-12-18 (×2): 1 via ORAL
  Filled 2017-12-16 (×2): qty 1

## 2017-12-16 MED ORDER — FLUOXETINE HCL 20 MG PO CAPS
20.0000 mg | ORAL_CAPSULE | Freq: Every day | ORAL | Status: DC
  Filled 2017-12-16: qty 1

## 2017-12-16 MED ORDER — IPRATROPIUM-ALBUTEROL 0.5-2.5 (3) MG/3ML IN SOLN
3.0000 mL | Freq: Once | RESPIRATORY_TRACT | Status: AC
  Administered 2017-12-16: 3 mL via RESPIRATORY_TRACT

## 2017-12-16 MED ORDER — ROFLUMILAST 500 MCG PO TABS
500.0000 ug | ORAL_TABLET | Freq: Every day | ORAL | Status: DC
  Administered 2017-12-17 – 2017-12-18 (×2): 500 ug via ORAL
  Filled 2017-12-16 (×2): qty 1

## 2017-12-16 MED ORDER — ENOXAPARIN SODIUM 40 MG/0.4ML ~~LOC~~ SOLN
40.0000 mg | SUBCUTANEOUS | Status: DC
  Administered 2017-12-16: 40 mg via SUBCUTANEOUS
  Filled 2017-12-16 (×2): qty 0.4

## 2017-12-16 MED ORDER — METOPROLOL TARTRATE 5 MG/5ML IV SOLN
5.0000 mg | Freq: Four times a day (QID) | INTRAVENOUS | Status: DC | PRN
  Administered 2017-12-16: 5 mg via INTRAVENOUS
  Filled 2017-12-16: qty 5

## 2017-12-16 MED ORDER — TRAZODONE HCL 50 MG PO TABS
50.0000 mg | ORAL_TABLET | Freq: Every evening | ORAL | Status: DC | PRN
  Administered 2017-12-16 – 2017-12-17 (×2): 50 mg via ORAL
  Filled 2017-12-16 (×2): qty 1

## 2017-12-16 MED ORDER — PANTOPRAZOLE SODIUM 40 MG PO TBEC
40.0000 mg | DELAYED_RELEASE_TABLET | Freq: Every day | ORAL | Status: DC
  Administered 2017-12-17 – 2017-12-18 (×2): 40 mg via ORAL
  Filled 2017-12-16 (×2): qty 1

## 2017-12-16 MED ORDER — ONDANSETRON HCL 4 MG/2ML IJ SOLN
4.0000 mg | Freq: Four times a day (QID) | INTRAMUSCULAR | Status: DC | PRN
  Administered 2017-12-17 (×2): 4 mg via INTRAVENOUS
  Filled 2017-12-16 (×2): qty 2

## 2017-12-16 MED ORDER — IPRATROPIUM-ALBUTEROL 0.5-2.5 (3) MG/3ML IN SOLN
3.0000 mL | RESPIRATORY_TRACT | Status: DC
  Administered 2017-12-16 – 2017-12-17 (×4): 3 mL via RESPIRATORY_TRACT
  Filled 2017-12-16 (×4): qty 3

## 2017-12-16 MED ORDER — MORPHINE SULFATE (PF) 2 MG/ML IV SOLN
2.0000 mg | INTRAVENOUS | Status: DC | PRN
  Administered 2017-12-17 – 2017-12-18 (×5): 2 mg via INTRAVENOUS
  Filled 2017-12-16 (×5): qty 1

## 2017-12-16 MED ORDER — IPRATROPIUM-ALBUTEROL 0.5-2.5 (3) MG/3ML IN SOLN
3.0000 mL | Freq: Once | RESPIRATORY_TRACT | Status: AC
  Administered 2017-12-16: 3 mL via RESPIRATORY_TRACT
  Filled 2017-12-16: qty 9

## 2017-12-16 MED ORDER — ONDANSETRON HCL 4 MG PO TABS: 4.0000 mg | ORAL_TABLET | Freq: Four times a day (QID) | ORAL | Status: DC | PRN

## 2017-12-16 MED ORDER — LUBIPROSTONE 24 MCG PO CAPS
24.0000 ug | ORAL_CAPSULE | Freq: Two times a day (BID) | ORAL | Status: DC
  Administered 2017-12-17 – 2017-12-18 (×3): 24 ug via ORAL
  Filled 2017-12-16 (×5): qty 1

## 2017-12-16 NOTE — ED Notes (Addendum)
EDP notified of pt's HR and given strips. Pt denies hx of afib or having symptoms such as palpitations or racing HR. Pt is on bipap. No new orders at this time.

## 2017-12-16 NOTE — ED Notes (Signed)
Patient transported to X-ray 

## 2017-12-16 NOTE — Progress Notes (Signed)
ANTIBIOTIC CONSULT NOTE - INITIAL  Pharmacy Consult for Ceftriaxone  Indication: pneumonia  Allergies  Allergen Reactions  . Strawberry Extract Hives  . Temazepam     REACTION: does not work for sleep    Patient Measurements: Height: 5\' 5"  (165.1 cm) Weight: 198 lb (89.8 kg) IBW/kg (Calculated) : 57 Adjusted Body Weight:    Vital Signs: Temp: 98.7 F (37.1 C) (05/01 1355) Temp Source: Oral (05/01 1355) BP: 120/78 (05/01 1830) Pulse Rate: 107 (05/01 1842) Intake/Output from previous day: No intake/output data recorded. Intake/Output from this shift: No intake/output data recorded.  Labs: Recent Labs    12/16/17 1405  WBC 22.9*  HGB 11.1*  PLT 308  CREATININE 0.55   Estimated Creatinine Clearance: 79.7 mL/min (by C-G formula based on SCr of 0.55 mg/dL). No results for input(s): VANCOTROUGH, VANCOPEAK, VANCORANDOM, GENTTROUGH, GENTPEAK, GENTRANDOM, TOBRATROUGH, TOBRAPEAK, TOBRARND, AMIKACINPEAK, AMIKACINTROU, AMIKACIN in the last 72 hours.   Microbiology: No results found for this or any previous visit (from the past 720 hour(s)).  Medical History: Past Medical History:  Diagnosis Date  . Anxiety   . Carotid bruit    L --nl doppler 5/09- and again 5/13 with 0-39% stenosis bilat  . Constipation   . COPD (chronic obstructive pulmonary disease) ([place])   . Fatigue   . Fracture of femoral neck, right ([place]) 2015  . GI (gastrointestinal bleed)    [name]  . Hepatitis   . Hyperlipidemia   . Left arm pain   . Leg pain    Chronic pain R leg from injury  . Osteopenia   . Other organic sleep disorders   . Tobacco abuse     Medications:   (Not in a hospital admission) Assessment: CrCl = 79.7 ml/min  Goal of Therapy:  resolution of infection  Plan:  Expected duration 7 days with resolution of temperature and/or normalization of WBC   Ceftriaxone 1 gm IV Q24H ordered to start on 5/1 @ 22:00.   Vin Yonke D 12/16/2017,8:28 PM

## 2017-12-16 NOTE — ED Triage Notes (Addendum)
Pt to ED reporting that she "does not feel like myself." Husband reports pt has hx of COPD and reports every 6-8 weeks she will have a spell of hypoxia that causes her Co2 to build up and cause AMS. Pts oxygen saturation was reported to be in the 70s at home. Pt has has 2 breathing treatments this morning and is currently on 3L O2 upon arrival and has an oxygen saturation of 95%. Cough and congestion reported for the past couple days. No increased WOB noted and no increased SOB at this time.   Pt making multiple statements in triage that do not make sense. Husband is stating this is the AMS he is reporting. RN offered a wheelchair and pt stated, "no, I have one at home"

## 2017-12-16 NOTE — ED Notes (Signed)
Resp in rm to apply bipap
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
## 2017-12-16 NOTE — ED Notes (Signed)
Darnelle Maffucci, pt's son called and given update per pt's permission.

## 2017-12-16 NOTE — Progress Notes (Signed)
Transported pt to ICu on Bipap without incident. Pt remains on Bipap and is tol well at this time. Report given to ICU RT.
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
## 2017-12-17 ENCOUNTER — Other Ambulatory Visit: Payer: Self-pay

## 2017-12-17 LAB — BASIC METABOLIC PANEL
Anion gap: 5 (ref 5–15)
BUN: 9 mg/dL (ref 6–20)
CO2: 36 mmol/L — AB (ref 22–32)
CREATININE: 0.52 mg/dL (ref 0.44–1.00)
Calcium: 8.7 mg/dL — ABNORMAL LOW (ref 8.9–10.3)
Chloride: 99 mmol/L — ABNORMAL LOW (ref 101–111)
GFR calc Af Amer: >60 mL/min (ref >60)
GFR calc non Af Amer: >60 mL/min (ref >60)
GLUCOSE: 194 mg/dL — AB (ref 65–99)
Potassium: 3.3 mmol/L — ABNORMAL LOW (ref 3.5–5.1)
SODIUM: 140 mmol/L (ref 135–145)

## 2017-12-17 LAB — URINALYSIS, COMPLETE (UACMP) WITH MICROSCOPIC
BACTERIA UA: NONE SEEN
BILIRUBIN URINE: NEGATIVE
GLUCOSE, UA: NEGATIVE mg/dL
KETONES UR: NEGATIVE mg/dL
NITRITE: POSITIVE — AB
PH: 8 (ref 5.0–8.0)
Protein, ur: NEGATIVE mg/dL
Specific Gravity, Urine: 1.005 (ref 1.005–1.030)
WBC, UA: >50 WBC/hpf — ABNORMAL HIGH (ref 0–5)

## 2017-12-17 LAB — CBC
HCT: 30.9 % — ABNORMAL LOW (ref 35.0–47.0)
Hemoglobin: 10.2 g/dL — ABNORMAL LOW (ref 12.0–16.0)
MCH: 28.3 pg (ref 26.0–34.0)
MCHC: 32.9 g/dL (ref 32.0–36.0)
MCV: 85.9 fL (ref 80.0–100.0)
PLATELETS: 284 10*3/uL (ref 150–440)
RBC: 3.59 MIL/uL — ABNORMAL LOW (ref 3.80–5.20)
RDW: 16.7 % — AB (ref 11.5–14.5)
WBC: 15.6 10*3/uL — ABNORMAL HIGH (ref 3.6–11.0)

## 2017-12-17 LAB — GLUCOSE, CAPILLARY: GLUCOSE-CAPILLARY: 146 mg/dL — AB (ref 65–99)

## 2017-12-17 LAB — TROPONIN I: Troponin I: <0.03 ng/mL (ref <0.03)

## 2017-12-17 LAB — MRSA PCR SCREENING: MRSA by PCR: NEGATIVE

## 2017-12-17 MED ORDER — IPRATROPIUM-ALBUTEROL 0.5-2.5 (3) MG/3ML IN SOLN
3.0000 mL | Freq: Three times a day (TID) | RESPIRATORY_TRACT | Status: DC
  Administered 2017-12-17 – 2017-12-18 (×2): 3 mL via RESPIRATORY_TRACT
  Filled 2017-12-17 (×3): qty 3

## 2017-12-17 MED ORDER — POTASSIUM CHLORIDE CRYS ER 20 MEQ PO TBCR
20.0000 meq | EXTENDED_RELEASE_TABLET | ORAL | Status: AC
  Administered 2017-12-17 (×2): 20 meq via ORAL
  Filled 2017-12-17: qty 1

## 2017-12-17 MED ORDER — IPRATROPIUM-ALBUTEROL 0.5-2.5 (3) MG/3ML IN SOLN: 3.0000 mL | RESPIRATORY_TRACT | Status: DC | PRN

## 2017-12-17 MED ORDER — DILTIAZEM HCL ER COATED BEADS 120 MG PO CP24
120.0000 mg | ORAL_CAPSULE | Freq: Every day | ORAL | Status: DC
  Administered 2017-12-17 – 2017-12-18 (×2): 120 mg via ORAL
  Filled 2017-12-17 (×3): qty 1
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
## 2017-12-17 NOTE — Progress Notes (Signed)
[facility] [place] NAME: Debra [name]    MR#:  [id number]  DATE OF BIRTH:  09/02/1955  SUBJECTIVE:  CHIEF COMPLAINT:   Chief Complaint  Patient presents with  . Altered Mental Status  husband at bedside worried about CO2 going up, patient with no complaints, feels some better REVIEW OF SYSTEMS:  Review of Systems  Constitutional: Negative for chills, fever and weight loss.  HENT: Negative for nosebleeds and sore throat.   Eyes: Negative for blurred vision.  Respiratory: Positive for shortness of breath. Negative for cough and wheezing.   Cardiovascular: Negative for chest pain, orthopnea, leg swelling and PND.  Gastrointestinal: Negative for abdominal pain, constipation, diarrhea, heartburn, nausea and vomiting.  Genitourinary: Negative for dysuria and urgency.  Musculoskeletal: Negative for back pain.  Skin: Negative for rash.  Neurological: Negative for dizziness, speech change, focal weakness and headaches.  Endo/Heme/Allergies: Does not bruise/bleed easily.  Psychiatric/Behavioral: Negative for depression.    DRUG ALLERGIES:   Allergies  Allergen Reactions  . Strawberry Extract Hives  . Temazepam     REACTION: does not work for sleep   VITALS:  Blood pressure 118/67, pulse 92, temperature 98.6 F (37 C), temperature source Oral, resp. rate 19, height 5\' 4"  (1.626 m), weight 90.5 kg (199 lb 8.3 oz), SpO2 93 %. PHYSICAL EXAMINATION:  Physical Exam  Constitutional: She is oriented to person, place, and time.  HENT:  Head: Normocephalic and atraumatic.  Eyes: Pupils are equal, round, and reactive to light. Conjunctivae and EOM are normal.  Neck: Normal range of motion. Neck supple. No tracheal deviation present. No thyromegaly present.  Cardiovascular: Normal rate, regular rhythm and normal heart sounds.  Pulmonary/Chest: Effort normal and breath sounds normal. No respiratory distress. She has no wheezes. She exhibits no  tenderness.  Abdominal: Soft. Bowel sounds are normal. She exhibits no distension. There is no tenderness.  Musculoskeletal: Normal range of motion.  Neurological: She is alert and oriented to person, place, and time. No cranial nerve deficit.  Skin: Skin is warm and dry. No rash noted.   LABORATORY PANEL:  Female CBC Recent Labs  Lab 12/17/17 0528  WBC 15.6*  HGB 10.2*  HCT 30.9*  PLT 284   ------------------------------------------------------------------------------------------------------------------ Chemistries  Recent Labs  Lab 12/16/17 1405 12/17/17 0528  NA 140 140  K 4.3 3.3*  CL 96* 99*  CO2 37* 36*  GLUCOSE 124* 194*  BUN 7 9  CREATININE 0.55 0.52  CALCIUM 9.0 8.7*  AST 27  --   ALT 17  --   ALKPHOS 86  --   BILITOT 0.4  --    RADIOLOGY:  No results found. ASSESSMENT AND PLAN:  Debra [name]  is a 62 y.o. female with a known history of COPD not on any home oxygen, hypertension, chronic right leg pain from a previous surgery, former smoker, chronic constipation, depression and anxiety was brought in from home secondary to confusion.  1.  Acute hypoxic hypercarbic respiratory failure-secondary to COPD exacerbation and possibly underlying bronchitis -weaned off BiPAP  -IV steroids, nebs -IV antibiotics and pulmonary consult  2.  Leukocytosis-likely secondary to bronchitis/pneumonia -check Procalcitonin .  neg blood cultures. continue  Rocephin and azithromycin for now -Chest x-ray is otherwise negative.  Check urine analysis  3.  Chronic leg pain-continue Lyrica.  Hold MS Contin due to her drowsy mentation.  Can be restarted once her CO2 is normalized and mental status is better -PRN pain medications ordered.  4.  Depression anxiety has been continue Prozac       All the records are reviewed and case discussed with Care Management/Social Worker. Management plans discussed with the patient, family (husband at bedside) and they are in  agreement.  CODE STATUS: Full Code  TOTAL TIME TAKING CARE OF THIS PATIENT: 25 minutes.   More than 50% of the time was spent in counseling/coordination of care: YES  POSSIBLE D/C IN 1-2 DAYS, DEPENDING ON CLINICAL CONDITION.   Max Sane M.D on 12/17/2017 at 8:32 PM  Between 7am to 6pm - Pager - [phone]  After 6pm go to [email or web address] - Proofreader  [facility]  Office  [phone]  CC: Primary care physician; [name], MD  Note: This dictation was prepared with Dragon dictation along with smaller phrase technology. Any transcriptional errors that result from this process are unintentional.

## 2017-12-18 ENCOUNTER — Inpatient Hospital Stay
Admission: EM | Admit: 2017-12-18 | Discharge: 2017-12-18 | Disposition: A | Discharge status: Home / Self Care | Payer: Medicare Other | Source: Home / Self Care | Attending: Cardiology | Admitting: Cardiology

## 2017-12-18 LAB — ECHOCARDIOGRAM COMPLETE
HEIGHTINCHES: 64 in
Weight: 3192.26 oz

## 2017-12-18 LAB — CBC
HEMATOCRIT: 31.4 % — AB (ref 35.0–47.0)
HEMOGLOBIN: 10.5 g/dL — AB (ref 12.0–16.0)
MCH: 28.7 pg (ref 26.0–34.0)
MCHC: 33.5 g/dL (ref 32.0–36.0)
MCV: 85.8 fL (ref 80.0–100.0)
Platelets: 296 10*3/uL (ref 150–440)
RBC: 3.66 MIL/uL — ABNORMAL LOW (ref 3.80–5.20)
RDW: 17 % — ABNORMAL HIGH (ref 11.5–14.5)
WBC: 17.8 10*3/uL — AB (ref 3.6–11.0)

## 2017-12-18 LAB — BASIC METABOLIC PANEL
ANION GAP: 9 (ref 5–15)
BUN: 13 mg/dL (ref 6–20)
CALCIUM: 8.7 mg/dL — AB (ref 8.9–10.3)
CHLORIDE: 103 mmol/L (ref 101–111)
CO2: 31 mmol/L (ref 22–32)
Creatinine, Ser: 0.57 mg/dL (ref 0.44–1.00)
GFR calc non Af Amer: >60 mL/min (ref >60)
Glucose, Bld: 141 mg/dL — ABNORMAL HIGH (ref 65–99)
Potassium: 3.4 mmol/L — ABNORMAL LOW (ref 3.5–5.1)
SODIUM: 143 mmol/L (ref 135–145)

## 2017-12-18 LAB — PROCALCITONIN: Procalcitonin: <0.1 ng/mL

## 2017-12-18 LAB — HIV ANTIBODY (ROUTINE TESTING W REFLEX): HIV Screen 4th Generation wRfx: NONREACTIVE

## 2017-12-18 MED ORDER — RISAQUAD PO CAPS: 1.0000 | ORAL_CAPSULE | Freq: Every day | ORAL | 0 refills | Status: DC

## 2017-12-18 MED ORDER — AMOXICILLIN-POT CLAVULANATE 875-125 MG PO TABS
1.0000 | ORAL_TABLET | Freq: Two times a day (BID) | ORAL | Status: DC
  Administered 2017-12-18: 14:00:00 1 via ORAL
  Filled 2017-12-18: qty 1

## 2017-12-18 MED ORDER — PREDNISONE 10 MG (21) PO TBPK: 10.0000 mg | ORAL_TABLET | Freq: Every day | ORAL | 0 refills | Status: DC

## 2017-12-18 MED ORDER — DILTIAZEM HCL ER COATED BEADS 120 MG PO CP24: 120.0000 mg | ORAL_CAPSULE | Freq: Every day | ORAL | 1 refills | Status: DC

## 2017-12-18 MED ORDER — ACETAMINOPHEN 325 MG PO TABS: 650.0000 mg | ORAL_TABLET | Freq: Four times a day (QID) | ORAL | Status: DC | PRN

## 2017-12-18 MED ORDER — POTASSIUM CHLORIDE CRYS ER 20 MEQ PO TBCR
40.0000 meq | EXTENDED_RELEASE_TABLET | Freq: Once | ORAL | Status: AC
  Administered 2017-12-18: 14:00:00 40 meq via ORAL
  Filled 2017-12-18: qty 2

## 2017-12-18 MED ORDER — AMOXICILLIN-POT CLAVULANATE 875-125 MG PO TABS: 1.0000 | ORAL_TABLET | Freq: Two times a day (BID) | ORAL | 0 refills | Status: DC

## 2017-12-18 NOTE — Discharge Instructions (Signed)
Follow-up with primary care physician 1 week, follow-up on the echocardiogram results Follow-up  with pulmonology in 1-2  weeks

## 2017-12-18 NOTE — Care Management (Signed)
Patient has chronic O2 at home [facility]. Husband denies any needs.

## 2017-12-18 NOTE — Discharge Summary (Signed)
[facility] [place] NAME: Debra [name]    MR#:  [id number]  DATE OF BIRTH:  1955/11/12  DATE OF ADMISSION:  12/16/2017 ADMITTING PHYSICIAN: Gladstone Lighter, MD  DATE OF DISCHARGE: 12/18/17  PRIMARY CARE PHYSICIAN: [name], MD    ADMISSION DIAGNOSIS:  Hypercapnia [R06.89] COPD exacerbation (HCC) [J44.1] Altered mental status, unspecified altered mental status type [R41.82]  DISCHARGE DIAGNOSIS:  Active Problems:   COPD exacerbation ([place] [place])   SECONDARY DIAGNOSIS:   Past Medical History:  Diagnosis Date  . Anxiety   . Carotid bruit    L --nl doppler 5/09- and again 5/13 with 0-39% stenosis bilat  . Constipation   . COPD (chronic obstructive pulmonary disease) ([place])   . Fatigue   . Fracture of femoral neck, right ([place]) 2015  . GI (gastrointestinal bleed)    [name]  . Hepatitis   . Hyperlipidemia   . Left arm pain   . Leg pain    Chronic pain R leg from injury  . Osteopenia   . Other organic sleep disorders   . Tobacco abuse     HOSPITAL COURSE:   HPI Debra [name]  is a 62 y.o. female with a known history of COPD not on any home oxygen, hypertension, chronic right leg pain from a previous surgery, former smoker, chronic constipation, depression and anxiety was brought in from home secondary to confusion. Patient is currently on BiPAP, altered and only arousable to deep palpation.  Husband at bedside provides most of the history.  At baseline patient uses a walker to ambulate since her leg surgery.  She was doing well up until yesterday when she started complaining of postnasal drip and scratchy throat.  Denies any fevers or chills.  No nausea or vomiting.  This morning she was very confused and was hallucinating and talking to dead people.  She has had high carbon dioxide in the past with similar presentation, so her husband brought her to the hospital.  She was very tight and auscultation.  ABG showing CO2 of 91.  She  is currently on BiPAP and is being admitted to stepdown unit.   1. Acute hypoxic hypercarbic respiratory failure-secondary to COPD exacerbation and possibly underlying bronchitis - clinically better -weaned off BiPAP  -IV steroids tapered, nebs -IV antibiotics given and changed to p.o. antibiotics for possible underlying bronchitis and pulmonary followed pt   2. Leukocytosis-likely secondary to bronchitis/pneumonia/possible UTI -Procalcitonin in the normal range. neg blood cultures.   Rocephin and azithromycin  given during the hospital course will be changed to p.o. Augmentin -Chest x-ray is otherwise negative.   urine analysis with positive nitrates will discharge patient with p.o. Augmentin  3. Chronic leg pain-continue Lyrica. Hold MS Contin due to her drowsy mentation. Can be restarted once her CO2 is normalized and mental status is better -PRN pain medications ordered.  4. Depression anxiety has been continue Prozac     DISCHARGE CONDITIONS:  Stable   CONSULTS OBTAINED:  Treatment Team:  Erby Pian, MD Teodoro Spray, MD   PROCEDURES  None   DRUG ALLERGIES:   Allergies  Allergen Reactions  . Strawberry Extract Hives  . Temazepam     REACTION: does not work for sleep    DISCHARGE MEDICATIONS:   Allergies as of 12/18/2017      Reactions   Strawberry Extract Hives   Temazepam    REACTION: does not work for sleep      Medication  List    STOP taking these medications   clindamycin 300 MG capsule Commonly known as:  CLEOCIN   ondansetron 4 MG disintegrating tablet Commonly known as:  ZOFRAN ODT     TAKE these medications   acetaminophen 325 MG tablet Commonly known as:  TYLENOL Take 2 tablets (650 mg total) by mouth every 6 (six) hours as needed for mild pain (or Fever >/= 101).   acidophilus Caps capsule Take 1 capsule by mouth daily.   ADVAIR HFA 115-21 MCG/ACT inhaler Generic drug:  fluticasone-salmeterol Take 2 sprays by  mouth 2 (two) times daily.   albuterol (2.5 MG/3ML) 0.083% nebulizer solution Commonly known as:  PROVENTIL Take 2.5 mg by nebulization every 4 (four) hours as needed for wheezing or shortness of breath.   alendronate 70 MG tablet Commonly known as:  FOSAMAX Take 70 mg by mouth once a week.   amoxicillin-clavulanate 875-125 MG tablet Commonly known as:  AUGMENTIN Take 1 tablet by mouth every 12 (twelve) hours.   diltiazem 120 MG 24 hr capsule Commonly known as:  CARDIZEM CD Take 1 capsule (120 mg total) by mouth daily. Start taking on:  12/19/2017   FLUoxetine 20 MG tablet Commonly known as:  PROZAC Take 1 tablet by mouth daily for anxiety and depression.   furosemide 20 MG tablet Commonly known as:  LASIX Take 20 mg by mouth daily.   iron polysaccharides 150 MG capsule Commonly known as:  NIFEREX Take 150 mg by mouth daily.   lubiprostone 24 MCG capsule Commonly known as:  AMITIZA Take 24 mcg by mouth daily with breakfast.   mirtazapine 15 MG tablet Commonly known as:  REMERON Take 15 mg by mouth at bedtime.   morphine 15 MG 12 hr tablet Commonly known as:  MS CONTIN Take 15 mg by mouth 3 (three) times daily.   omeprazole 20 MG capsule Commonly known as:  PRILOSEC Take 20 mg by mouth daily.   predniSONE 10 MG (21) Tbpk tablet Commonly known as:  STERAPRED UNI-PAK 21 TAB Take 1 tablet (10 mg total) by mouth daily. Take 6 tablets by mouth for 1 day followed by  5 tablets by mouth for 1 day followed by  4 tablets by mouth for 1 day followed by  3 tablets by mouth for 1 day followed by  2 tablets by mouth for 1 day followed by  1 tablet by mouth for a day and stop   pregabalin 150 MG capsule Commonly known as:  LYRICA Take 150 mg by mouth 2 (two) times daily.   roflumilast 500 MCG Tabs tablet Commonly known as:  DALIRESP Take 500 mcg by mouth every other day.   sertraline 50 MG tablet Commonly known as:  ZOLOFT Take 50 mg by mouth daily.   tiotropium 18  MCG inhalation capsule Commonly known as:  SPIRIVA Place 18 mcg into inhaler and inhale daily.   traZODone 50 MG tablet Commonly known as:  DESYREL Take 50 mg by mouth at bedtime.        DISCHARGE INSTRUCTIONS:   Follow-up with primary care physician 1 week, follow-up on the echocardiogram results Follow-up  with pulmonology in 1-2  weeks  DIET:  Cardiac diet  DISCHARGE CONDITION:  Stable  ACTIVITY:  Activity as tolerated  OXYGEN:  Home Oxygen: Yes.     Oxygen Delivery: 2 liters/min via Patient connected to nasal cannula oxygen  DISCHARGE LOCATION:  home   If you experience worsening of your admission symptoms, develop shortness of  breath, life threatening emergency, suicidal or homicidal thoughts you must seek medical attention immediately by calling 911 or calling your MD immediately  if symptoms less severe.  You Must read complete instructions/literature along with all the possible adverse reactions/side effects for all the Medicines you take and that have been prescribed to you. Take any new Medicines after you have completely understood and accpet all the possible adverse reactions/side effects.   Please note  You were cared for by a hospitalist during your hospital stay. If you have any questions about your discharge medications or the care you received while you were in the hospital after you are discharged, you can call the unit and asked to speak with the hospitalist on call if the hospitalist that took care of you is not available. Once you are discharged, your primary care physician will handle any further medical issues. Please note that NO REFILLS for any discharge medications will be authorized once you are discharged, as it is imperative that you return to your primary care physician (or establish a relationship with a primary care physician if you do not have one) for your aftercare needs so that they can reassess your need for medications and monitor your lab  values.     Today  Chief Complaint  Patient presents with  . Altered Mental Status   Pt is feeling better, sob is better, wants to go home  ROS:  CONSTITUTIONAL: Denies fevers, chills. Denies any fatigue, weakness.  EYES: Denies blurry vision, double vision, eye pain. EARS, NOSE, THROAT: Denies tinnitus, ear pain, hearing loss. RESPIRATORY: Denies cough, wheeze, improving shortness of breath.  CARDIOVASCULAR: Denies chest pain, palpitations, edema.  GASTROINTESTINAL: Denies nausea, vomiting, diarrhea, abdominal pain. Denies bright red blood per rectum. GENITOURINARY: Denies dysuria, hematuria. ENDOCRINE: Denies nocturia or thyroid problems. HEMATOLOGIC AND LYMPHATIC: Denies easy bruising or bleeding. SKIN: Denies rash or lesion. MUSCULOSKELETAL: Denies pain in neck, back, shoulder, knees, hips or arthritic symptoms.  NEUROLOGIC: Denies paralysis, paresthesias.  PSYCHIATRIC: Denies anxiety or depressive symptoms.   VITAL SIGNS:  Blood pressure 128/68, pulse 75, temperature 98.2 F (36.8 C), temperature source Oral, resp. rate 19, height 5\' 4"  (1.626 m), weight 90.5 kg (199 lb 8.3 oz), SpO2 93 %.  I/O:    Intake/Output Summary (Last 24 hours) at 12/18/2017 1351 Last data filed at 12/18/2017 0542 Gross per 24 hour  Intake 1200 ml  Output -  Net 1200 ml    PHYSICAL EXAMINATION:  GENERAL:  62 y.o.-year-old patient lying in the bed with no acute distress.  EYES: Pupils equal, round, reactive to light and accommodation. No scleral icterus. Extraocular muscles intact.  HEENT: Head atraumatic, normocephalic. Oropharynx and nasopharynx clear.  NECK:  Supple, no jugular venous distention. No thyroid enlargement, no tenderness.  LUNGS: mod  breath sounds bilaterally, no wheezing, rales,rhonchi or crepitation. No use of accessory muscles of respiration.  CARDIOVASCULAR: S1, S2 normal. No murmurs, rubs, or gallops.  ABDOMEN: Soft, non-tender, non-distended. Bowel sounds present. No  organomegaly or mass.  EXTREMITIES: No pedal edema, cyanosis, or clubbing.  NEUROLOGIC: Cranial nerves II through XII are intact. Muscle strength 5/5 in all extremities. Sensation intact. Gait not checked.  PSYCHIATRIC: The patient is alert and oriented x 3.  SKIN: No obvious rash, lesion, or ulcer.   DATA REVIEW:   CBC Recent Labs  Lab 12/18/17 0426  WBC 17.8*  HGB 10.5*  HCT 31.4*  PLT 296    Chemistries  Recent Labs  Lab 12/16/17 1405  12/18/17  0426  NA 140   < > 143  K 4.3   < > 3.4*  CL 96*   < > 103  CO2 37*   < > 31  GLUCOSE 124*   < > 141*  BUN 7   < > 13  CREATININE 0.55   < > 0.57  CALCIUM 9.0   < > 8.7*  AST 27  --   --   ALT 17  --   --   ALKPHOS 86  --   --   BILITOT 0.4  --   --    < > = values in this interval not displayed.    Cardiac Enzymes Recent Labs  Lab 12/17/17 0016  TROPONINI <0.03    Microbiology Results  Results for orders placed or performed during the hospital encounter of 12/16/17  MRSA PCR Screening     Status: None   Collection Time: 12/16/17 10:23 PM  Result Value Ref Range Status   MRSA by PCR NEGATIVE NEGATIVE Final    Comment:        The GeneXpert MRSA Assay (FDA approved for NASAL specimens only), is one component of a comprehensive MRSA colonization surveillance program. It is not intended to diagnose MRSA infection nor to guide or monitor treatment for MRSA infections. Performed at [facility], [address] [place], [place] [address]     RADIOLOGY:  Dg Chest 2 View  Result Date: 12/16/2017 CLINICAL DATA:  62 year old female with a history of feeling strange EXAM: CHEST - 2 VIEW COMPARISON:  12/23/2014 FINDINGS: Cardiomediastinal silhouette unchanged in size and contour. Coarsened interstitial markings, similar prior. No pneumothorax or pleural effusion. No confluent airspace disease. IMPRESSION: Chronic lung changes without evidence of superimposed acute cardiopulmonary disease. Electronically  Signed   By: Corrie Mckusick D.O.   On: 12/16/2017 17:45    EKG:   Orders placed or performed during the hospital encounter of 12/16/17  . EKG 12-Lead  . EKG 12-Lead  . EKG 12-Lead  . EKG 12-Lead  . EKG 12-Lead  . EKG 12-Lead      Management plans discussed with the patient, family and they are in agreement.  CODE STATUS:     Code Status Orders  (From admission, onward)        Start     Ordered   12/16/17 2223  Full code  Continuous     12/16/17 2222    Code Status History    This patient has a current code status but no historical code status.      TOTAL TIME TAKING CARE OF THIS PATIENT: 43  minutes.   Note: This dictation was prepared with Dragon dictation along with smaller phrase technology. Any transcriptional errors that result from this process are unintentional.   @[facility] @  on 12/18/2017 at 1:51 PM  Between 7am to 6pm - Pager - [phone]  After 6pm go to [email or web address] - password [email or web address] [facility]  Office  [phone]  CC: Primary care physician; [name], MD

## 2017-12-18 NOTE — Care Management Important Message (Signed)
Important Message  Patient Details  Name: Debra [name] MRN: [id number] Date of Birth: July 07, 1956   Medicare Important Message Given:  Yes    Juliann Pulse A Sharhonda Atwood 12/18/2017, 11:28 AM

## 2017-12-18 NOTE — Progress Notes (Signed)
*  PRELIMINARY RESULTS* Echocardiogram 2D Echocardiogram has been performed.  [place] 12/18/2017, 10:41 AM

## 2017-12-18 NOTE — Progress Notes (Signed)
Pt discharged in care of husband to transition home. Discharge instructions provided and reviewed with pt and family. Hard copy prescriptions provided. No question at time of discharge

## 2018-01-07 ENCOUNTER — Inpatient Hospital Stay: Payer: Medicare Other | Admitting: Internal Medicine

## 2018-01-07 NOTE — Progress Notes (Deleted)
The patient is a 62 year old female with a history of oxygen dependent COPD.  She was recently admitted to the hospital on 12/16/2017 for 2 days due to COPD exacerbation.  She normally follows with Dr. Raul Del she has a history of chronic hypercapnic respiratory failure.  Imaging personally reviewed, chest x-ray 12/16/2017; mild hyperinflation consistent with emphysema.  Results for PALAK, TERCERO (MRN [id number]) as of 01/07/2018 14:31  Ref. Range 06/25/2015 15:22  Sample type Unknown ARTERIAL DRAW  FIO2 Unknown 28.00  pH, Arterial Latest Ref Range: 7.350 - 7.450  7.39  pCO2 arterial Latest Ref Range: 32.0 - 48.0 mmHg 55 (H)  pO2, Arterial Latest Ref Range: 83.0 - 108.0 mmHg 87  Acid-Base Excess Latest Ref Range: 0.0 - 3.0 mmol/L 6.8 (H)  Bicarbonate Latest Ref Range: 21.0 - 28.0 mEq/L 33.3 (H)  O2 Saturation Latest Units: % 96.5

## 2018-05-05 ENCOUNTER — Other Ambulatory Visit: Payer: Self-pay | Admitting: Internal Medicine

## 2018-05-05 ENCOUNTER — Other Ambulatory Visit: Payer: Self-pay | Admitting: Primary Care

## 2018-05-05 DIAGNOSIS — Z1231 Encounter for screening mammogram for malignant neoplasm of breast: Secondary | ICD-10-CM

## 2018-05-24 ENCOUNTER — Ambulatory Visit
Admission: RE | Admit: 2018-05-24 | Discharge: 2018-05-24 | Disposition: A | Discharge status: Home / Self Care | Payer: Medicare Other | Source: Ambulatory Visit | Attending: Internal Medicine | Admitting: Internal Medicine

## 2018-05-24 DIAGNOSIS — Z1231 Encounter for screening mammogram for malignant neoplasm of breast: Secondary | ICD-10-CM | POA: Diagnosis present

## 2019-04-20 ENCOUNTER — Other Ambulatory Visit: Payer: Self-pay | Admitting: Internal Medicine

## 2019-04-20 DIAGNOSIS — Z1231 Encounter for screening mammogram for malignant neoplasm of breast: Secondary | ICD-10-CM

## 2019-09-23 ENCOUNTER — Other Ambulatory Visit: Payer: Self-pay

## 2019-09-23 ENCOUNTER — Emergency Department
Admission: EM | Admit: 2019-09-23 | Discharge: 2019-09-24 | Disposition: A | Discharge status: Other Acute Inpatient Hospital | Payer: Medicare Other | Attending: Emergency Medicine | Admitting: Emergency Medicine

## 2019-09-23 DIAGNOSIS — Z87891 Personal history of nicotine dependence: Secondary | ICD-10-CM | POA: Insufficient documentation

## 2019-09-23 DIAGNOSIS — Z79899 Other long term (current) drug therapy: Secondary | ICD-10-CM | POA: Insufficient documentation

## 2019-09-23 DIAGNOSIS — R21 Rash and other nonspecific skin eruption: Secondary | ICD-10-CM | POA: Diagnosis present

## 2019-09-23 DIAGNOSIS — L03115 Cellulitis of right lower limb: Secondary | ICD-10-CM

## 2019-09-23 DIAGNOSIS — J449 Chronic obstructive pulmonary disease, unspecified: Secondary | ICD-10-CM | POA: Diagnosis not present

## 2019-09-23 DIAGNOSIS — R233 Spontaneous ecchymoses: Secondary | ICD-10-CM

## 2019-09-23 DIAGNOSIS — A419 Sepsis, unspecified organism: Secondary | ICD-10-CM

## 2019-09-23 LAB — CBC WITH DIFFERENTIAL/PLATELET
Abs Immature Granulocytes: 0.04 10*3/uL (ref 0.00–0.07)
Basophils Absolute: 0 10*3/uL (ref 0.0–0.1)
Basophils Relative: 0 %
Eosinophils Absolute: 0.2 10*3/uL (ref 0.0–0.5)
Eosinophils Relative: 2 %
HCT: 36.3 % (ref 36.0–46.0)
Hemoglobin: 11.4 g/dL — ABNORMAL LOW (ref 12.0–15.0)
Immature Granulocytes: 0 %
Lymphocytes Relative: 16 %
Lymphs Abs: 2.1 10*3/uL (ref 0.7–4.0)
MCH: 28.4 pg (ref 26.0–34.0)
MCHC: 31.4 g/dL (ref 30.0–36.0)
MCV: 90.5 fL (ref 80.0–100.0)
Monocytes Absolute: 1 10*3/uL (ref 0.1–1.0)
Monocytes Relative: 8 %
Neutro Abs: 10 10*3/uL — ABNORMAL HIGH (ref 1.7–7.7)
Neutrophils Relative %: 74 %
Platelets: 434 10*3/uL — ABNORMAL HIGH (ref 150–400)
RBC: 4.01 MIL/uL (ref 3.87–5.11)
RDW: 13.5 % (ref 11.5–15.5)
WBC: 13.5 10*3/uL — ABNORMAL HIGH (ref 4.0–10.5)
nRBC: 0 % (ref 0.0–0.2)

## 2019-09-23 LAB — LACTIC ACID, PLASMA: Lactic Acid, Venous: 1.5 mmol/L (ref 0.5–1.9)

## 2019-09-23 LAB — URINALYSIS, COMPLETE (UACMP) WITH MICROSCOPIC
Bilirubin Urine: NEGATIVE
Glucose, UA: NEGATIVE mg/dL
Ketones, ur: NEGATIVE mg/dL
Leukocytes,Ua: NEGATIVE
Nitrite: NEGATIVE
Protein, ur: 30 mg/dL — AB
Specific Gravity, Urine: 1.02 (ref 1.005–1.030)
pH: 6 (ref 5.0–8.0)

## 2019-09-23 LAB — COMPREHENSIVE METABOLIC PANEL
ALT: 25 U/L (ref 0–44)
AST: 32 U/L (ref 15–41)
Albumin: 3.2 g/dL — ABNORMAL LOW (ref 3.5–5.0)
Alkaline Phosphatase: 79 U/L (ref 38–126)
Anion gap: 15 (ref 5–15)
BUN: 10 mg/dL (ref 8–23)
CO2: 28 mmol/L (ref 22–32)
Calcium: 9 mg/dL (ref 8.9–10.3)
Chloride: 94 mmol/L — ABNORMAL LOW (ref 98–111)
Creatinine, Ser: 0.76 mg/dL (ref 0.44–1.00)
GFR calc Af Amer: >60 mL/min (ref >60)
GFR calc non Af Amer: >60 mL/min (ref >60)
Glucose, Bld: 120 mg/dL — ABNORMAL HIGH (ref 70–99)
Potassium: 3.4 mmol/L — ABNORMAL LOW (ref 3.5–5.1)
Sodium: 137 mmol/L (ref 135–145)
Total Bilirubin: 0.5 mg/dL (ref 0.3–1.2)
Total Protein: 7.5 g/dL (ref 6.5–8.1)

## 2019-09-23 LAB — APTT: aPTT: 33 seconds (ref 24–36)

## 2019-09-23 LAB — PROTIME-INR
INR: 1.1 (ref 0.8–1.2)
Prothrombin Time: 14 seconds (ref 11.4–15.2)

## 2019-09-23 MED ORDER — MORPHINE SULFATE (PF) 4 MG/ML IV SOLN
4.0000 mg | Freq: Once | INTRAVENOUS | Status: AC
  Administered 2019-09-23: 4 mg via INTRAVENOUS
  Filled 2019-09-23: qty 1

## 2019-09-23 MED ORDER — VANCOMYCIN HCL 750 MG/150ML IV SOLN
750.0000 mg | Freq: Once | INTRAVENOUS | Status: AC
  Administered 2019-09-23: 750 mg via INTRAVENOUS
  Filled 2019-09-23: qty 150

## 2019-09-23 MED ORDER — HYDROMORPHONE HCL 1 MG/ML IJ SOLN
0.5000 mg | Freq: Once | INTRAMUSCULAR | Status: AC
  Administered 2019-09-23: 0.5 mg via INTRAVENOUS
  Filled 2019-09-23: qty 1

## 2019-09-23 MED ORDER — VANCOMYCIN HCL IN DEXTROSE 1-5 GM/200ML-% IV SOLN
1000.0000 mg | Freq: Once | INTRAVENOUS | Status: AC
  Administered 2019-09-23: 1000 mg via INTRAVENOUS
  Filled 2019-09-23: qty 200

## 2019-09-23 MED ORDER — LACTATED RINGERS IV BOLUS (SEPSIS)
1000.0000 mL | Freq: Once | INTRAVENOUS | Status: AC
  Administered 2019-09-23: 1000 mL via INTRAVENOUS

## 2019-09-23 MED ORDER — SODIUM CHLORIDE 0.9% FLUSH
3.0000 mL | Freq: Once | INTRAVENOUS | Status: AC
  Administered 2019-09-23: 3 mL via INTRAVENOUS

## 2019-09-23 MED ORDER — SODIUM CHLORIDE 0.9 % IV SOLN
2.0000 g | Freq: Once | INTRAVENOUS | Status: AC
  Administered 2019-09-23: 2 g via INTRAVENOUS
  Filled 2019-09-23: qty 20

## 2019-09-23 NOTE — ED Triage Notes (Signed)
Pt comes via POV from home with c/o rash and right leg pain. Pt states COVID vaccine on December 27th.  Pt states post the vaccine she noticed the rash and swelling on her legs in January. PCP evaluated  And was given prescriptions and then sent today to get checked out. Pt also has rash to bilateral arms.  Pt has large swelling, drainage, redness and purple discoloration noted to right lower leg.

## 2019-09-23 NOTE — Progress Notes (Signed)
CODE SEPSIS - PHARMACY COMMUNICATION  **Broad Spectrum Antibiotics should be administered within 1 hour of Sepsis diagnosis**  Time Code Sepsis Called/Page Received: 1650  Antibiotics Ordered: ceftriaxone and vancomycin  Time of 1st antibiotic administration: 1726  Additional action taken by pharmacy:   If necessary, Name of Provider/Nurse Contacted:     Rocky Morel ,PharmD Clinical Pharmacist  09/23/2019  5:32 PM

## 2019-09-23 NOTE — Progress Notes (Signed)
PHARMACY -  BRIEF ANTIBIOTIC NOTE   Pharmacy has received consult(s) for vancomycin from an ED provider.  The patient's profile has been reviewed for ht/wt/allergies/indication/available labs.    One time order(s) placed for vancomycin 1 g IV x1 by EDP.  Will order another vancomycin 750 mg IV x1 to complete total loading dose of 1750 mg.   Further antibiotics/pharmacy consults should be ordered by admitting physician if indicated.                       Thank you, Rocky Morel 09/23/2019  5:13 PM

## 2019-09-23 NOTE — ED Notes (Signed)
Pt given a box lunch and drink with MD permission
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
## 2019-09-23 NOTE — ED Notes (Addendum)
Full rainbow sent and one set of cultures

## 2019-09-26 MED ORDER — OXYCODONE HCL 5 MG PO TABS
5.00 | ORAL_TABLET | ORAL | Status: DC
Start: ? — End: 2019-09-26

## 2019-09-26 MED ORDER — TRIAMCINOLONE ACETONIDE 0.1 % EX CREA
TOPICAL_CREAM | CUTANEOUS | Status: DC
Start: ? — End: 2019-09-26

## 2019-09-26 MED ORDER — LIDOCAINE HCL 1 % IJ SOLN
0.50 | INTRAMUSCULAR | Status: DC
Start: ? — End: 2019-09-26

## 2019-09-26 MED ORDER — ALBUTEROL SULFATE HFA 108 (90 BASE) MCG/ACT IN AERS
1.00 | INHALATION_SPRAY | RESPIRATORY_TRACT | Status: DC
Start: ? — End: 2019-09-26

## 2019-09-26 MED ORDER — MELATONIN 3 MG PO TABS
6.00 | ORAL_TABLET | ORAL | Status: DC
Start: 2019-09-26 — End: 2019-09-26

## 2019-09-26 MED ORDER — GENERIC EXTERNAL MEDICATION
Status: DC
Start: ? — End: 2019-09-26

## 2019-09-26 MED ORDER — ENOXAPARIN SODIUM 40 MG/0.4ML ~~LOC~~ SOLN
40.00 | SUBCUTANEOUS | Status: DC
Start: 2019-09-27 — End: 2019-09-26

## 2019-09-26 MED ORDER — ACETAMINOPHEN 325 MG PO TABS
650.00 | ORAL_TABLET | ORAL | Status: DC
Start: ? — End: 2019-09-26

## 2019-09-26 MED ORDER — TIOTROPIUM BROMIDE MONOHYDRATE 18 MCG IN CAPS
18.00 | ORAL_CAPSULE | RESPIRATORY_TRACT | Status: DC
Start: 2019-09-27 — End: 2019-09-26

## 2019-09-26 MED ORDER — PREGABALIN 50 MG PO CAPS
150.00 | ORAL_CAPSULE | ORAL | Status: DC
Start: 2019-09-26 — End: 2019-09-26

## 2019-09-26 MED ORDER — SERTRALINE HCL 100 MG PO TABS
100.00 | ORAL_TABLET | ORAL | Status: DC
Start: 2019-09-27 — End: 2019-09-26

## 2019-09-26 MED ORDER — BUDESONIDE-FORMOTEROL FUMARATE 80-4.5 MCG/ACT IN AERO
2.00 | INHALATION_SPRAY | RESPIRATORY_TRACT | Status: DC
Start: 2019-09-26 — End: 2019-09-26

## 2019-09-28 LAB — CULTURE, BLOOD (ROUTINE X 2)
Culture: NO GROWTH
Culture: NO GROWTH
Special Requests: ADEQUATE
Special Requests: ADEQUATE

## 2019-12-12 ENCOUNTER — Other Ambulatory Visit: Payer: Self-pay | Admitting: Internal Medicine

## 2019-12-12 DIAGNOSIS — Z1231 Encounter for screening mammogram for malignant neoplasm of breast: Secondary | ICD-10-CM

## 2020-06-26 ENCOUNTER — Ambulatory Visit: Payer: Self-pay | Admitting: Urology

## 2020-07-10 ENCOUNTER — Other Ambulatory Visit: Payer: Self-pay

## 2020-07-10 ENCOUNTER — Ambulatory Visit (INDEPENDENT_AMBULATORY_CARE_PROVIDER_SITE_OTHER): Payer: Medicare Other | Admitting: Urology

## 2020-07-10 ENCOUNTER — Encounter: Payer: Self-pay | Admitting: Urology

## 2020-07-10 VITALS — BP 146/75 | HR 93

## 2020-07-10 DIAGNOSIS — Z8051 Family history of malignant neoplasm of kidney: Secondary | ICD-10-CM

## 2020-07-10 DIAGNOSIS — R3129 Other microscopic hematuria: Secondary | ICD-10-CM

## 2020-07-10 DIAGNOSIS — Z87891 Personal history of nicotine dependence: Secondary | ICD-10-CM

## 2020-07-10 NOTE — Patient Instructions (Signed)
Cystoscopy Cystoscopy is a procedure that is used to help diagnose and sometimes treat conditions that affect the lower urinary tract. The lower urinary tract includes the bladder and the urethra. The urethra is the tube that drains urine from the bladder. Cystoscopy is done using a thin, tube-shaped instrument with a light and camera at the end (cystoscope). The cystoscope may be hard or flexible, depending on the goal of the procedure. The cystoscope is inserted through the urethra, into the bladder. Cystoscopy may be recommended if you have:  Urinary tract infections that keep coming back.  Blood in the urine (hematuria).  An inability to control when you urinate (urinary incontinence) or an overactive bladder.  Unusual cells found in a urine sample.  A blockage in the urethra, such as a urinary stone.  Painful urination.  An abnormality in the bladder found during an intravenous pyelogram (IVP) or CT scan. Cystoscopy may also be done to remove a sample of tissue to be examined under a microscope (biopsy). What are the risks? Generally, this is a safe procedure. However, problems may occur, including:  Infection.  Bleeding.  What happens during the procedure?  1. You will be given one or more of the following: ? A medicine to numb the area (local anesthetic). 2. The area around the opening of your urethra will be cleaned. 3. The cystoscope will be passed through your urethra into your bladder. 4. Germ-free (sterile) fluid will flow through the cystoscope to fill your bladder. The fluid will stretch your bladder so that your health care provider can clearly examine your bladder walls. 5. Your doctor will look at the urethra and bladder. 6. The cystoscope will be removed The procedure may vary among health care providers  What can I expect after the procedure? After the procedure, it is common to have: 1. Some soreness or pain in your abdomen and urethra. 2. Urinary symptoms.  These include: ? Mild pain or burning when you urinate. Pain should stop within a few minutes after you urinate. This may last for up to 1 week. ? A small amount of blood in your urine for several days. ? Feeling like you need to urinate but producing only a small amount of urine. Follow these instructions at home: General instructions  Return to your normal activities as told by your health care provider.   Do not drive for 24 hours if you were given a sedative during your procedure.  Watch for any blood in your urine. If the amount of blood in your urine increases, call your health care provider.  If a tissue sample was removed for testing (biopsy) during your procedure, it is up to you to get your test results. Ask your health care provider, or the department that is doing the test, when your results will be ready.  Drink enough fluid to keep your urine pale yellow.  Keep all follow-up visits as told by your health care provider. This is important. Contact a health care provider if you:  Have pain that gets worse or does not get better with medicine, especially pain when you urinate.  Have trouble urinating.  Have more blood in your urine. Get help right away if you:  Have blood clots in your urine.  Have abdominal pain.  Have a fever or chills.  Are unable to urinate. Summary  Cystoscopy is a procedure that is used to help diagnose and sometimes treat conditions that affect the lower urinary tract.  Cystoscopy is done using   a thin, tube-shaped instrument with a light and camera at the end.  After the procedure, it is common to have some soreness or pain in your abdomen and urethra.  Watch for any blood in your urine. If the amount of blood in your urine increases, call your health care provider.  If you were prescribed an antibiotic medicine, take it as told by your health care provider. Do not stop taking the antibiotic even if you start to feel better. This  information is not intended to replace advice given to you by your health care provider. Make sure you discuss any questions you have with your health care provider. Document Revised: 07/27/2018 Document Reviewed: 07/27/2018 Elsevier Patient Education  2020 Elsevier Inc.   
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
## 2020-07-17 LAB — URINALYSIS, COMPLETE
Bilirubin, UA: NEGATIVE
Glucose, UA: NEGATIVE
Ketones, UA: NEGATIVE
Leukocytes,UA: NEGATIVE
Nitrite, UA: NEGATIVE
Protein,UA: NEGATIVE
Specific Gravity, UA: 1.02 (ref 1.005–1.030)
Urobilinogen, Ur: 0.2 mg/dL (ref 0.2–1.0)
pH, UA: 5.5 (ref 5.0–7.5)

## 2020-07-17 LAB — MICROSCOPIC EXAMINATION

## 2020-08-01 ENCOUNTER — Observation Stay
Admission: EM | Admit: 2020-08-01 | Discharge: 2020-08-03 | Disposition: A | Discharge status: Home / Self Care | Payer: Medicare Other | Attending: Internal Medicine | Admitting: Internal Medicine

## 2020-08-01 ENCOUNTER — Encounter: Payer: Self-pay | Admitting: Emergency Medicine

## 2020-08-01 ENCOUNTER — Other Ambulatory Visit: Payer: Self-pay

## 2020-08-01 ENCOUNTER — Emergency Department: Payer: Medicare Other

## 2020-08-01 DIAGNOSIS — I4891 Unspecified atrial fibrillation: Secondary | ICD-10-CM

## 2020-08-01 DIAGNOSIS — Z7951 Long term (current) use of inhaled steroids: Secondary | ICD-10-CM | POA: Insufficient documentation

## 2020-08-01 DIAGNOSIS — Z79899 Other long term (current) drug therapy: Secondary | ICD-10-CM | POA: Diagnosis not present

## 2020-08-01 DIAGNOSIS — Z87891 Personal history of nicotine dependence: Secondary | ICD-10-CM | POA: Insufficient documentation

## 2020-08-01 DIAGNOSIS — F32A Depression, unspecified: Secondary | ICD-10-CM | POA: Diagnosis present

## 2020-08-01 DIAGNOSIS — J441 Chronic obstructive pulmonary disease with (acute) exacerbation: Secondary | ICD-10-CM | POA: Diagnosis not present

## 2020-08-01 DIAGNOSIS — Z8616 Personal history of COVID-19: Secondary | ICD-10-CM | POA: Diagnosis not present

## 2020-08-01 DIAGNOSIS — G4700 Insomnia, unspecified: Secondary | ICD-10-CM | POA: Diagnosis present

## 2020-08-01 DIAGNOSIS — Z20822 Contact with and (suspected) exposure to covid-19: Secondary | ICD-10-CM | POA: Insufficient documentation

## 2020-08-01 DIAGNOSIS — F419 Anxiety disorder, unspecified: Secondary | ICD-10-CM | POA: Diagnosis present

## 2020-08-01 DIAGNOSIS — G8929 Other chronic pain: Secondary | ICD-10-CM | POA: Diagnosis present

## 2020-08-01 DIAGNOSIS — J449 Chronic obstructive pulmonary disease, unspecified: Secondary | ICD-10-CM | POA: Diagnosis present

## 2020-08-01 DIAGNOSIS — R112 Nausea with vomiting, unspecified: Secondary | ICD-10-CM | POA: Diagnosis present

## 2020-08-01 DIAGNOSIS — J4489 Other specified chronic obstructive pulmonary disease: Secondary | ICD-10-CM | POA: Diagnosis present

## 2020-08-01 HISTORY — DX: Unspecified atrial fibrillation: I48.91

## 2020-08-01 LAB — CBC
HCT: 32.7 % — ABNORMAL LOW (ref 36.0–46.0)
Hemoglobin: 10.8 g/dL — ABNORMAL LOW (ref 12.0–15.0)
MCH: 30.6 pg (ref 26.0–34.0)
MCHC: 33 g/dL (ref 30.0–36.0)
MCV: 92.6 fL (ref 80.0–100.0)
Platelets: 481 10*3/uL — ABNORMAL HIGH (ref 150–400)
RBC: 3.53 MIL/uL — ABNORMAL LOW (ref 3.87–5.11)
RDW: 13.1 % (ref 11.5–15.5)
WBC: 12.1 10*3/uL — ABNORMAL HIGH (ref 4.0–10.5)
nRBC: 0 % (ref 0.0–0.2)

## 2020-08-01 LAB — URINALYSIS, COMPLETE (UACMP) WITH MICROSCOPIC
Bacteria, UA: NONE SEEN
Glucose, UA: NEGATIVE mg/dL
Ketones, ur: 5 mg/dL — AB
Leukocytes,Ua: NEGATIVE
Nitrite: NEGATIVE
Protein, ur: 100 mg/dL — AB
RBC / HPF: >50 RBC/hpf — ABNORMAL HIGH (ref 0–5)
Specific Gravity, Urine: 1.025 (ref 1.005–1.030)
pH: 5 (ref 5.0–8.0)

## 2020-08-01 LAB — CBC WITH DIFFERENTIAL/PLATELET
Abs Immature Granulocytes: 0.09 10*3/uL — ABNORMAL HIGH (ref 0.00–0.07)
Basophils Absolute: 0 10*3/uL (ref 0.0–0.1)
Basophils Relative: 0 %
Eosinophils Absolute: 0 10*3/uL (ref 0.0–0.5)
Eosinophils Relative: 0 %
HCT: 34.7 % — ABNORMAL LOW (ref 36.0–46.0)
Hemoglobin: 11.5 g/dL — ABNORMAL LOW (ref 12.0–15.0)
Immature Granulocytes: 1 %
Lymphocytes Relative: 10 %
Lymphs Abs: 1.2 10*3/uL (ref 0.7–4.0)
MCH: 30.4 pg (ref 26.0–34.0)
MCHC: 33.1 g/dL (ref 30.0–36.0)
MCV: 91.8 fL (ref 80.0–100.0)
Monocytes Absolute: 0.6 10*3/uL (ref 0.1–1.0)
Monocytes Relative: 5 %
Neutro Abs: 9.9 10*3/uL — ABNORMAL HIGH (ref 1.7–7.7)
Neutrophils Relative %: 84 %
Platelets: 491 10*3/uL — ABNORMAL HIGH (ref 150–400)
RBC: 3.78 MIL/uL — ABNORMAL LOW (ref 3.87–5.11)
RDW: 13.1 % (ref 11.5–15.5)
WBC: 11.9 10*3/uL — ABNORMAL HIGH (ref 4.0–10.5)
nRBC: 0 % (ref 0.0–0.2)

## 2020-08-01 LAB — COMPREHENSIVE METABOLIC PANEL
ALT: 28 U/L (ref 0–44)
AST: 27 U/L (ref 15–41)
Albumin: 3.1 g/dL — ABNORMAL LOW (ref 3.5–5.0)
Alkaline Phosphatase: 86 U/L (ref 38–126)
Anion gap: 14 (ref 5–15)
BUN: 7 mg/dL — ABNORMAL LOW (ref 8–23)
CO2: 32 mmol/L (ref 22–32)
Calcium: 9 mg/dL (ref 8.9–10.3)
Chloride: 93 mmol/L — ABNORMAL LOW (ref 98–111)
Creatinine, Ser: 0.7 mg/dL (ref 0.44–1.00)
GFR, Estimated: >60 mL/min (ref >60)
Glucose, Bld: 129 mg/dL — ABNORMAL HIGH (ref 70–99)
Potassium: 2.8 mmol/L — ABNORMAL LOW (ref 3.5–5.1)
Sodium: 139 mmol/L (ref 135–145)
Total Bilirubin: 0.7 mg/dL (ref 0.3–1.2)
Total Protein: 8.3 g/dL — ABNORMAL HIGH (ref 6.5–8.1)

## 2020-08-01 LAB — RESP PANEL BY RT-PCR (FLU A&B, COVID) ARPGX2
Influenza A by PCR: NEGATIVE
Influenza B by PCR: NEGATIVE
SARS Coronavirus 2 by RT PCR: NEGATIVE

## 2020-08-01 LAB — LIPASE, BLOOD: Lipase: 20 U/L (ref 11–51)

## 2020-08-01 MED ORDER — OXYCODONE HCL 5 MG PO TABS
15.0000 mg | ORAL_TABLET | Freq: Four times a day (QID) | ORAL | Status: DC | PRN
Start: 2020-08-01 — End: 2020-08-03
  Administered 2020-08-02 – 2020-08-03 (×3): 15 mg via ORAL
  Filled 2020-08-01 (×3): qty 3

## 2020-08-01 MED ORDER — SERTRALINE HCL 50 MG PO TABS
50.0000 mg | ORAL_TABLET | Freq: Every day | ORAL | Status: DC
  Administered 2020-08-02 – 2020-08-03 (×2): 50 mg via ORAL
  Filled 2020-08-01 (×2): qty 1

## 2020-08-01 MED ORDER — HYDROCOD POLST-CPM POLST ER 10-8 MG/5ML PO SUER
5.0000 mL | Freq: Every evening | ORAL | Status: AC | PRN
Start: 2020-08-01 — End: 2020-08-02
  Administered 2020-08-02 (×2): 5 mL via ORAL
  Filled 2020-08-01 (×2): qty 5

## 2020-08-01 MED ORDER — AZITHROMYCIN 500 MG PO TABS
500.0000 mg | ORAL_TABLET | Freq: Once | ORAL | Status: AC
  Administered 2020-08-01: 500 mg via ORAL
  Filled 2020-08-01: qty 1

## 2020-08-01 MED ORDER — PANTOPRAZOLE SODIUM 40 MG PO TBEC
40.0000 mg | DELAYED_RELEASE_TABLET | Freq: Every day | ORAL | Status: DC
  Administered 2020-08-02 – 2020-08-03 (×2): 40 mg via ORAL
  Filled 2020-08-01 (×2): qty 1

## 2020-08-01 MED ORDER — PREDNISONE 20 MG PO TABS
20.0000 mg | ORAL_TABLET | Freq: Once | ORAL | Status: AC
  Administered 2020-08-01: 20 mg via ORAL
  Filled 2020-08-01: qty 1

## 2020-08-01 MED ORDER — PREGABALIN 75 MG PO CAPS
75.0000 mg | ORAL_CAPSULE | Freq: Two times a day (BID) | ORAL | Status: DC
  Administered 2020-08-02 – 2020-08-03 (×4): 75 mg via ORAL
  Filled 2020-08-01 (×4): qty 1

## 2020-08-01 MED ORDER — POTASSIUM CHLORIDE 10 MEQ/100ML IV SOLN
10.0000 meq | INTRAVENOUS | Status: DC
  Administered 2020-08-02 (×3): 10 meq via INTRAVENOUS
  Filled 2020-08-01 (×3): qty 100

## 2020-08-01 MED ORDER — MIRTAZAPINE 15 MG PO TABS: 15.0000 mg | ORAL_TABLET | Freq: Every day | ORAL | Status: DC

## 2020-08-01 MED ORDER — APIXABAN 5 MG PO TABS
5.0000 mg | ORAL_TABLET | Freq: Two times a day (BID) | ORAL | Status: DC
  Administered 2020-08-02 – 2020-08-03 (×4): 5 mg via ORAL
  Filled 2020-08-01 (×4): qty 1

## 2020-08-01 MED ORDER — ONDANSETRON HCL 4 MG/2ML IJ SOLN: 4.0000 mg | Freq: Four times a day (QID) | INTRAMUSCULAR | Status: DC | PRN

## 2020-08-01 MED ORDER — IPRATROPIUM-ALBUTEROL 0.5-2.5 (3) MG/3ML IN SOLN
3.0000 mL | Freq: Once | RESPIRATORY_TRACT | Status: AC
  Administered 2020-08-01: 3 mL via RESPIRATORY_TRACT
  Filled 2020-08-01: qty 3

## 2020-08-01 MED ORDER — DILTIAZEM HCL 25 MG/5ML IV SOLN
10.0000 mg | Freq: Once | INTRAVENOUS | Status: AC
  Administered 2020-08-01: 10 mg via INTRAVENOUS
  Filled 2020-08-01: qty 5

## 2020-08-01 MED ORDER — TRAZODONE HCL 50 MG PO TABS: 50.0000 mg | ORAL_TABLET | Freq: Every day | ORAL | Status: DC

## 2020-08-01 MED ORDER — MELATONIN 5 MG PO TABS
5.0000 mg | ORAL_TABLET | Freq: Every day | ORAL | Status: DC
Start: 2020-08-01 — End: 2020-08-03
  Administered 2020-08-02 (×2): 5 mg via ORAL
  Filled 2020-08-01 (×3): qty 1

## 2020-08-01 MED ORDER — ACETAMINOPHEN 325 MG PO TABS
325.0000 mg | ORAL_TABLET | ORAL | Status: DC | PRN
  Administered 2020-08-02: 325 mg via ORAL
  Filled 2020-08-01: qty 1

## 2020-08-01 MED ORDER — DILTIAZEM HCL-DEXTROSE 125-5 MG/125ML-% IV SOLN (PREMIX)
5.0000 mg/h | INTRAVENOUS | Status: DC
  Administered 2020-08-01: 5 mg/h via INTRAVENOUS
  Filled 2020-08-01: qty 125

## 2020-08-01 MED ORDER — FLUTICASONE FUROATE-VILANTEROL 200-25 MCG/INH IN AEPB
1.0000 | INHALATION_SPRAY | Freq: Every day | RESPIRATORY_TRACT | Status: DC
  Administered 2020-08-02 – 2020-08-03 (×2): 1 via RESPIRATORY_TRACT
  Filled 2020-08-01 (×2): qty 28

## 2020-08-01 MED ORDER — POTASSIUM CITRATE-CITRIC ACID 1100-334 MG/5ML PO SOLN
10.0000 meq | Freq: Three times a day (TID) | ORAL | Status: DC
  Administered 2020-08-03: 10 meq via ORAL
  Filled 2020-08-01 (×4): qty 5

## 2020-08-01 NOTE — ED Notes (Signed)
Pt up to bedside toilet and placed back on monitor. When pt placed back on monitor pt was in AFIB RVR. EKG obtained and shown to MD.  Pt states she has not hx of Afib. MD notified with verbal read back. No other changes in pt assessment at this time. Pt denies CP

## 2020-08-01 NOTE — ED Triage Notes (Signed)
Pt comes into the ED via POV c/o emesis, cough, fever, and chills.  Denies any abdominal pain or being in contact with anyone knowingly that has COVID.  Pt states symptoms started on Tuesday.  Pt wears O2 at baseline.  Pt currently has even and unlabored respirations.
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
## 2020-08-02 ENCOUNTER — Observation Stay
Admit: 2020-08-02 | Discharge: 2020-08-02 | Disposition: A | Discharge status: Home / Self Care | Payer: Medicare Other | Attending: Internal Medicine | Admitting: Internal Medicine

## 2020-08-02 DIAGNOSIS — I4891 Unspecified atrial fibrillation: Secondary | ICD-10-CM | POA: Diagnosis not present

## 2020-08-02 DIAGNOSIS — E876 Hypokalemia: Secondary | ICD-10-CM | POA: Diagnosis not present

## 2020-08-02 DIAGNOSIS — J441 Chronic obstructive pulmonary disease with (acute) exacerbation: Secondary | ICD-10-CM | POA: Diagnosis not present

## 2020-08-02 DIAGNOSIS — D75839 Thrombocytosis, unspecified: Secondary | ICD-10-CM

## 2020-08-02 DIAGNOSIS — Z87891 Personal history of nicotine dependence: Secondary | ICD-10-CM

## 2020-08-02 DIAGNOSIS — I5189 Other ill-defined heart diseases: Secondary | ICD-10-CM

## 2020-08-02 HISTORY — DX: Other ill-defined heart diseases: I51.89

## 2020-08-02 LAB — CBC
HCT: 34 % — ABNORMAL LOW (ref 36.0–46.0)
Hemoglobin: 11.2 g/dL — ABNORMAL LOW (ref 12.0–15.0)
MCH: 30.6 pg (ref 26.0–34.0)
MCHC: 32.9 g/dL (ref 30.0–36.0)
MCV: 92.9 fL (ref 80.0–100.0)
Platelets: 525 10*3/uL — ABNORMAL HIGH (ref 150–400)
RBC: 3.66 MIL/uL — ABNORMAL LOW (ref 3.87–5.11)
RDW: 13.1 % (ref 11.5–15.5)
WBC: 11.7 10*3/uL — ABNORMAL HIGH (ref 4.0–10.5)
nRBC: 0 % (ref 0.0–0.2)

## 2020-08-02 LAB — BASIC METABOLIC PANEL
Anion gap: 12 (ref 5–15)
Anion gap: 15 (ref 5–15)
BUN: 9 mg/dL (ref 8–23)
BUN: 10 mg/dL (ref 8–23)
CO2: 32 mmol/L (ref 22–32)
CO2: 34 mmol/L — ABNORMAL HIGH (ref 22–32)
Calcium: 8.9 mg/dL (ref 8.9–10.3)
Calcium: 9.3 mg/dL (ref 8.9–10.3)
Chloride: 92 mmol/L — ABNORMAL LOW (ref 98–111)
Chloride: 93 mmol/L — ABNORMAL LOW (ref 98–111)
Creatinine, Ser: 0.84 mg/dL (ref 0.44–1.00)
Creatinine, Ser: 0.87 mg/dL (ref 0.44–1.00)
GFR, Estimated: >60 mL/min (ref >60)
GFR, Estimated: >60 mL/min (ref >60)
Glucose, Bld: 160 mg/dL — ABNORMAL HIGH (ref 70–99)
Glucose, Bld: 140 mg/dL — ABNORMAL HIGH (ref 70–99)
Potassium: 2.8 mmol/L — ABNORMAL LOW (ref 3.5–5.1)
Potassium: 3.3 mmol/L — ABNORMAL LOW (ref 3.5–5.1)
Sodium: 136 mmol/L (ref 135–145)
Sodium: 142 mmol/L (ref 135–145)

## 2020-08-02 LAB — ECHOCARDIOGRAM COMPLETE
AR max vel: 2.24 cm2
AV Area VTI: 2.44 cm2
AV Area mean vel: 2.04 cm2
AV Mean grad: 4 mmHg
AV Peak grad: 7.6 mmHg
Ao pk vel: 1.38 m/s
Area-P 1/2: 3.48 cm2
Height: 64 in
S' Lateral: 2.75 cm
Weight: 2880 oz

## 2020-08-02 LAB — TSH: TSH: 0.367 u[IU]/mL (ref 0.350–4.500)

## 2020-08-02 LAB — BRAIN NATRIURETIC PEPTIDE: B Natriuretic Peptide: 53.2 pg/mL (ref 0.0–100.0)

## 2020-08-02 LAB — TROPONIN I (HIGH SENSITIVITY)
Troponin I (High Sensitivity): 5 ng/L (ref <18)
Troponin I (High Sensitivity): 5 ng/L (ref <18)

## 2020-08-02 LAB — HIV ANTIBODY (ROUTINE TESTING W REFLEX): HIV Screen 4th Generation wRfx: NONREACTIVE

## 2020-08-02 MED ORDER — ROFLUMILAST 500 MCG PO TABS: 500.0000 ug | ORAL_TABLET | Freq: Every day | ORAL | Status: DC

## 2020-08-02 MED ORDER — POTASSIUM CHLORIDE CRYS ER 20 MEQ PO TBCR
40.0000 meq | EXTENDED_RELEASE_TABLET | Freq: Once | ORAL | Status: AC
  Administered 2020-08-02: 40 meq via ORAL
  Filled 2020-08-02: qty 2

## 2020-08-02 MED ORDER — IPRATROPIUM-ALBUTEROL 0.5-2.5 (3) MG/3ML IN SOLN
3.0000 mL | Freq: Four times a day (QID) | RESPIRATORY_TRACT | Status: DC
  Administered 2020-08-02 – 2020-08-03 (×4): 3 mL via RESPIRATORY_TRACT
  Filled 2020-08-02 (×4): qty 3

## 2020-08-02 MED ORDER — DILTIAZEM HCL ER 60 MG PO CP12
60.0000 mg | ORAL_CAPSULE | Freq: Two times a day (BID) | ORAL | Status: DC
  Administered 2020-08-02 – 2020-08-03 (×3): 60 mg via ORAL
  Filled 2020-08-02 (×5): qty 1

## 2020-08-02 MED ORDER — ROFLUMILAST 500 MCG PO TABS
500.0000 ug | ORAL_TABLET | Freq: Every day | ORAL | Status: DC
  Administered 2020-08-03: 500 ug via ORAL
  Filled 2020-08-02 (×2): qty 1

## 2020-08-02 NOTE — ED Notes (Signed)
PT becoming restless and anxious. Requesting something to help her sleep. Ensured pt that her medications were ordered and would be administered as soon as verified by pharmacy. PT voiced understanding

## 2020-08-02 NOTE — ED Notes (Signed)
Requested that pharmacy verify meds

## 2020-08-02 NOTE — ED Notes (Signed)
PT has returned to NSR with rate in high 80's to low 90's.  Dr. Tobie Poet made aware and EKG exported
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
## 2020-08-02 NOTE — ED Notes (Signed)
Pt helped to toilet, Pt steady on feet. Pt rehooked to monitors after getting back in bed, VSS, hr wnl.

## 2020-08-02 NOTE — Progress Notes (Signed)
PROGRESS NOTE    Debra [name]  ATF:[id number] DOB: [birth year] DOA: 08/01/2020 PCP: [name], MD   Assessment & Plan:   Principal Problem:   Atrial fibrillation with rapid ventricular response ([place]) Active Problems:   Former smoker   COPD with chronic bronchitis ([place])   Anxiety and depression   Chronic pain   Insomnia    Atrial fib: w/ RVR. New onset. Echo pending. Started on eliquis over night. Cardio consulted    Hypokalemia: KCl repleted. Will continue to monitor   Depression: severity unknown. Continue on home dose of zoloft  Insomnia: continue on home dose of melatonin   Thrombocytosis: etiology unclear. Will continue to monitor  Leukocytosis: likely reactive. Will continue to monitor   Hyperglycemia: no hx of DM. Will check HbA1c   DVT prophylaxis: eliquis Code Status: full  Family Communication:  Disposition Plan: likely d/c back home   Status is: Observation  The patient remains OBS appropriate and will d/c before 2 midnights.  Dispo: The patient is from: Home              Anticipated d/c is to: Home              Anticipated d/c date is: 2 days              Patient currently is not medically stable to d/c.        Consultants:   Cardio    Procedures:    Antimicrobials:    Subjective: Pt c/o fatigue   Objective: Vitals:   08/01/20 2300 08/02/20 0000 08/02/20 0250 08/02/20 0645  BP: 105/75 (!) 90/57 (!) 108/58 119/68  Pulse: 71 (!) 105 89 89  Resp: 15 11 (!) 21 16  Temp:      TempSrc:      SpO2: 95% 96% 98% 99%  Weight:      Height:        Intake/Output Summary (Last 24 hours) at 08/02/2020 0836 Last data filed at 08/02/2020 0651 Gross per 24 hour  Intake 125.85 ml  Output --  Net 125.85 ml   Filed Weights   08/01/20 1744  Weight: 81.6 kg    Examination:  General exam: Appears calm and comfortable  Respiratory system: Clear to auscultation. Respiratory effort normal. Cardiovascular system: irregularly  irregular. No rubs, gallops or clicks. No pedal edema. Gastrointestinal system: Abdomen is nondistended, soft and nontender. Normal bowel sounds heard. Central nervous system: Alert and oriented. Moves all 4 extremities  Psychiatry: Judgement and insight appear normal. Mood & affect appropriate.     Data Reviewed: I have personally reviewed following labs and imaging studies  CBC: Recent Labs  Lab 08/01/20 1746 08/01/20 2247 08/02/20 0400  WBC 12.1* 11.9* 11.7*  NEUTROABS  --  9.9*  --   HGB 10.8* 11.5* 11.2*  HCT 32.7* 34.7* 34.0*  MCV 92.6 91.8 92.9  PLT 481* 491* 270*   Basic Metabolic Panel: Recent Labs  Lab 08/01/20 0210 08/01/20 1746 08/02/20 0400  NA 136 139 142  K 2.8* 2.8* 3.3*  CL 92* 93* 93*  CO2 32 32 34*  GLUCOSE 160* 129* 140*  BUN 9 7* 10  CREATININE 0.84 0.70 0.87  CALCIUM 8.9 9.0 9.3   GFR: Estimated Creatinine Clearance: 65.8 mL/min (by C-G formula based on SCr of 0.87 mg/dL). Liver Function Tests: Recent Labs  Lab 08/01/20 1746  AST 27  ALT 28  ALKPHOS 86  BILITOT 0.7  PROT 8.3*  ALBUMIN 3.1*   Recent  Labs  Lab 08/01/20 1746  LIPASE 20   No results for input(s): AMMONIA in the last 168 hours. Coagulation Profile: No results for input(s): INR, PROTIME in the last 168 hours. Cardiac Enzymes: No results for input(s): CKTOTAL, CKMB, CKMBINDEX, TROPONINI in the last 168 hours. BNP (last 3 results) No results for input(s): PROBNP in the last 8760 hours. HbA1C: No results for input(s): HGBA1C in the last 72 hours. CBG: No results for input(s): GLUCAP in the last 168 hours. Lipid Profile: No results for input(s): CHOL, HDL, LDLCALC, TRIG, CHOLHDL, LDLDIRECT in the last 72 hours. Thyroid Function Tests: Recent Labs    08/01/20 2247  TSH 0.367   Anemia Panel: No results for input(s): VITAMINB12, FOLATE, FERRITIN, TIBC, IRON, RETICCTPCT in the last 72 hours. Sepsis Labs: No results for input(s): PROCALCITON, LATICACIDVEN in the last  168 hours.  Recent Results (from the past 240 hour(s))  Resp Panel by RT-PCR (Flu A&B, Covid) Nasopharyngeal Swab     Status: None   Collection Time: 08/01/20  9:18 PM   Specimen: Nasopharyngeal Swab; Nasopharyngeal(NP) swabs in vial transport medium  Result Value Ref Range Status   SARS Coronavirus 2 by RT PCR NEGATIVE NEGATIVE Final    Comment: (NOTE) SARS-CoV-2 target nucleic acids are NOT DETECTED.  The SARS-CoV-2 RNA is generally detectable in upper respiratory specimens during the acute phase of infection. The lowest concentration of SARS-CoV-2 viral copies this assay can detect is 138 copies/mL. A negative result does not preclude SARS-Cov-2 infection and should not be used as the sole basis for treatment or other patient management decisions. A negative result may occur with  improper specimen collection/handling, submission of specimen other than nasopharyngeal swab, presence of viral mutation(s) within the areas targeted by this assay, and inadequate number of viral copies(<138 copies/mL). A negative result must be combined with clinical observations, patient history, and epidemiological information. The expected result is Negative.  Fact Sheet for Patients:  EntrepreneurPulse.com.au  Fact Sheet for Healthcare Providers:  IncredibleEmployment.be  This test is no t yet approved or cleared by the [place] FDA and  has been authorized for detection and/or diagnosis of SARS-CoV-2 by FDA under an Emergency Use Authorization (EUA). This EUA will remain  in effect (meaning this test can be used) for the duration of the COVID-19 declaration under Section 564(b)(1) of the Act, 21 U.S.C.section 360bbb-3(b)(1), unless the authorization is terminated  or revoked sooner.       Influenza A by PCR NEGATIVE NEGATIVE Final   Influenza B by PCR NEGATIVE NEGATIVE Final    Comment: (NOTE) The Xpert Xpress SARS-CoV-2/FLU/RSV plus assay is  intended as an aid in the diagnosis of influenza from Nasopharyngeal swab specimens and should not be used as a sole basis for treatment. Nasal washings and aspirates are unacceptable for Xpert Xpress SARS-CoV-2/FLU/RSV testing.  Fact Sheet for Patients: EntrepreneurPulse.com.au  Fact Sheet for Healthcare Providers: IncredibleEmployment.be  This test is not yet approved or cleared by the [place] FDA and has been authorized for detection and/or diagnosis of SARS-CoV-2 by FDA under an Emergency Use Authorization (EUA). This EUA will remain in effect (meaning this test can be used) for the duration of the COVID-19 declaration under Section 564(b)(1) of the Act, 21 U.S.C. section 360bbb-3(b)(1), unless the authorization is terminated or revoked.  Performed at [facility], [address] [place], [place] [address]          Radiology Studies: DG Chest 2 View  Result Date: 08/01/2020 CLINICAL DATA:  Cough, low-grade fever EXAM: CHEST - 2 VIEW COMPARISON:  12/16/2017 FINDINGS: Mild peribronchial thickening and interstitial prominence. Heart is normal size. No confluent opacities or effusions. No acute bony abnormality. IMPRESSION: Chronic peribronchial thickening and interstitial prominence. No change since prior study. No active disease. Electronically Signed   By: Rolm Baptise M.D.   On: 08/01/2020 21:02        Scheduled Meds: . apixaban  5 mg Oral BID  . citric acid-potassium citrate  10 mEq Oral TID  . diltiazem  60 mg Oral Q12H  . fluticasone furoate-vilanterol  1 puff Inhalation Daily  . melatonin  5 mg Oral QHS  . pantoprazole  40 mg Oral Daily  . pregabalin  75 mg Oral BID  . sertraline  50 mg Oral Daily   Continuous Infusions: . diltiazem (CARDIZEM) infusion Stopped (08/02/20 0210)     LOS: 0 days    Time spent: 33 mins    Wyvonnia Dusky, MD [facility] Pager [phone]  If 7PM-7AM,  please contact night-coverage 08/02/2020, 8:36 AM

## 2020-08-02 NOTE — ED Notes (Signed)
Per MD Cox, will hold IV cardizem for now since patient has converted back to NSR. PO cardizem to arrive from pharmacy, message sent.

## 2020-08-02 NOTE — ED Notes (Signed)
Echo in room. Pt alert, calm. VSS. Pharmacy notified of missing meds

## 2020-08-02 NOTE — Progress Notes (Signed)
*  PRELIMINARY RESULTS* Echocardiogram 2D Echocardiogram has been performed.  Debra [name] 08/02/2020, 12:05 PM

## 2020-08-03 ENCOUNTER — Telehealth: Payer: Self-pay | Admitting: Cardiovascular Disease

## 2020-08-03 DIAGNOSIS — E876 Hypokalemia: Secondary | ICD-10-CM | POA: Diagnosis not present

## 2020-08-03 DIAGNOSIS — I4891 Unspecified atrial fibrillation: Secondary | ICD-10-CM | POA: Diagnosis not present

## 2020-08-03 DIAGNOSIS — D75839 Thrombocytosis, unspecified: Secondary | ICD-10-CM | POA: Diagnosis not present

## 2020-08-03 LAB — CBC
HCT: 30.7 % — ABNORMAL LOW (ref 36.0–46.0)
Hemoglobin: 9.9 g/dL — ABNORMAL LOW (ref 12.0–15.0)
MCH: 30.3 pg (ref 26.0–34.0)
MCHC: 32.2 g/dL (ref 30.0–36.0)
MCV: 93.9 fL (ref 80.0–100.0)
Platelets: 479 10*3/uL — ABNORMAL HIGH (ref 150–400)
RBC: 3.27 MIL/uL — ABNORMAL LOW (ref 3.87–5.11)
RDW: 13.1 % (ref 11.5–15.5)
WBC: 9.1 10*3/uL (ref 4.0–10.5)
nRBC: 0 % (ref 0.0–0.2)

## 2020-08-03 LAB — BASIC METABOLIC PANEL
Anion gap: 10 (ref 5–15)
BUN: 13 mg/dL (ref 8–23)
CO2: 34 mmol/L — ABNORMAL HIGH (ref 22–32)
Calcium: 9.3 mg/dL (ref 8.9–10.3)
Chloride: 94 mmol/L — ABNORMAL LOW (ref 98–111)
Creatinine, Ser: 0.9 mg/dL (ref 0.44–1.00)
GFR, Estimated: >60 mL/min (ref >60)
Glucose, Bld: 119 mg/dL — ABNORMAL HIGH (ref 70–99)
Potassium: 3.4 mmol/L — ABNORMAL LOW (ref 3.5–5.1)
Sodium: 138 mmol/L (ref 135–145)

## 2020-08-03 LAB — HEMOGLOBIN A1C
Hgb A1c MFr Bld: 5.3 % (ref 4.8–5.6)
Mean Plasma Glucose: 105.41 mg/dL

## 2020-08-03 MED ORDER — POTASSIUM CHLORIDE CRYS ER 20 MEQ PO TBCR
40.0000 meq | EXTENDED_RELEASE_TABLET | Freq: Once | ORAL | Status: AC
  Administered 2020-08-03: 40 meq via ORAL
  Filled 2020-08-03: qty 2

## 2020-08-03 MED ORDER — APIXABAN 5 MG PO TABS: 5.0000 mg | ORAL_TABLET | Freq: Two times a day (BID) | ORAL | 0 refills | Status: DC

## 2020-08-03 MED ORDER — POTASSIUM CITRATE-CITRIC ACID 1100-334 MG/5ML PO SOLN: 10.0000 meq | Freq: Three times a day (TID) | ORAL | Status: DC

## 2020-08-03 MED ORDER — POTASSIUM CITRATE-CITRIC ACID 1100-334 MG/5ML PO SOLN
10.0000 meq | Freq: Three times a day (TID) | ORAL | Status: DC
  Administered 2020-08-03: 10 meq via ORAL
  Filled 2020-08-03: qty 5

## 2020-08-03 MED ORDER — DILTIAZEM HCL ER 60 MG PO CP12: 60.0000 mg | ORAL_CAPSULE | Freq: Two times a day (BID) | ORAL | 0 refills | Status: DC

## 2020-08-03 MED ORDER — ORAL CARE MOUTH RINSE
15.0000 mL | Freq: Two times a day (BID) | OROMUCOSAL | Status: DC
  Administered 2020-08-03: 15 mL via OROMUCOSAL

## 2020-08-03 NOTE — Progress Notes (Signed)
Progress Note  Patient Name: Debra [name] Date of Encounter: 08/03/2020  Primary Cardiologist: Dr. Rockey Situ, new  Subjective   No CP, SOB, presyncope, syncope. She does not feel her heart racing or skipping beats. She denies any signs or symptoms of bleeding, reviewed in detail today.  Reports chronic cough reported, producing white/green sputum.  Uses 2L [place] oxygen at home.   Reports she does not drink alcohol.  She is still smoking.  Discussed plan for follow-up in the office within the next week, as well as repeat blood work.  Reviewed anticoagulation with patient understanding and no questions at this time.  Inpatient Medications    Scheduled Meds: . apixaban  5 mg Oral BID  . diltiazem  60 mg Oral Q12H  . fluticasone furoate-vilanterol  1 puff Inhalation Daily  . ipratropium-albuterol  3 mL Nebulization Q6H  . mouth rinse  15 mL Mouth Rinse BID  . melatonin  5 mg Oral QHS  . pantoprazole  40 mg Oral Daily  . pregabalin  75 mg Oral BID  . roflumilast  500 mcg Oral Daily  . sertraline  50 mg Oral Daily   Continuous Infusions:  PRN Meds: acetaminophen, ondansetron (ZOFRAN) IV, oxyCODONE   Vital Signs    Vitals:   08/03/20 0530 08/03/20 0730 08/03/20 0820 08/03/20 1152  BP: (!) 105/56 110/67 112/61 127/71  Pulse: 72 74 69 81  Resp: 14 12 16 18   Temp:   98.1 F (36.7 C) 97.8 F (36.6 C)  TempSrc:   Oral   SpO2: 99% 100% 98% 96%  Weight:      Height:        Intake/Output Summary (Last 24 hours) at 08/03/2020 1438 Last data filed at 08/03/2020 1425 Gross per 24 hour  Intake 480 ml  Output 200 ml  Net 280 ml   Last 3 Weights 08/01/2020 07/10/2020 09/23/2019  Weight (lbs) 180 lb (No Data) 173 lb  Weight (kg) 81.647 kg (No Data) 78.472 kg      Telemetry    NSR, PVCs, 60s to 80s- Personally Reviewed  ECG    No new tracings.- Personally Reviewed  Physical Exam   GEN:  Pale female.  No acute distress.   Neck: No JVD Cardiac: RRR, no murmurs,  rubs, or gallops.  Respiratory: Coarse breath sounds bilaterally. GI: Soft, nontender, non-distended  MS: No edema; No deformity. Neuro:  Nonfocal  Psych: Normal affect   Labs    High Sensitivity Troponin:   Recent Labs  Lab 08/01/20 2247 08/02/20 0210  TROPONINIHS 5 5      Chemistry Recent Labs  Lab 08/01/20 1746 08/02/20 0400 08/03/20 0327  NA 139 142 138  K 2.8* 3.3* 3.4*  CL 93* 93* 94*  CO2 32 34* 34*  GLUCOSE 129* 140* 119*  BUN 7* 10 13  CREATININE 0.70 0.87 0.90  CALCIUM 9.0 9.3 9.3  PROT 8.3*  --   --   ALBUMIN 3.1*  --   --   AST 27  --   --   ALT 28  --   --   ALKPHOS 86  --   --   BILITOT 0.7  --   --   GFRNONAA >60 >60 >60  ANIONGAP 14 15 10      Hematology Recent Labs  Lab 08/01/20 2247 08/02/20 0400 08/03/20 0327  WBC 11.9* 11.7* 9.1  RBC 3.78* 3.66* 3.27*  HGB 11.5* 11.2* 9.9*  HCT 34.7* 34.0* 30.7*  MCV 91.8 92.9 93.9  MCH 30.4 30.6 30.3  MCHC 33.1 32.9 32.2  RDW 13.1 13.1 13.1  PLT 491* 525* 479*    BNP Recent Labs  Lab 08/01/20 1746  BNP 53.2     DDimer No results for input(s): DDIMER in the last 168 hours.   Radiology    DG Chest 2 View  Result Date: 08/01/2020 CLINICAL DATA:  Cough, low-grade fever EXAM: CHEST - 2 VIEW COMPARISON:  12/16/2017 FINDINGS: Mild peribronchial thickening and interstitial prominence. Heart is normal size. No confluent opacities or effusions. No acute bony abnormality. IMPRESSION: Chronic peribronchial thickening and interstitial prominence. No change since prior study. No active disease. Electronically Signed   By: Rolm Baptise M.D.   On: 08/01/2020 21:02   ECHOCARDIOGRAM COMPLETE  Result Date: 08/02/2020    ECHOCARDIOGRAM REPORT   Patient Name:   Debra [name] Date of Exam: 08/02/2020 Medical Rec #:  [id number]    Height:       64.0 in Accession #:    [id number]   Weight:       180.0 lb Date of Birth:  10/28/1955     BSA:          1.871 m Patient Age:    64 years     BP:           112/57 mmHg  Patient Gender: F            HR:           79 bpm. Exam Location:  [facility] Procedure: 2D Echo, Color Doppler and Cardiac Doppler Indications:     I48.91 Atrial Fibrillation  History:         Patient has prior history of Echocardiogram examinations. COPD;                  Risk Factors:Current Smoker and Dyslipidemia.  Sonographer:     Charmayne Sheer RDCS (AE) Referring Phys:  [id number] AMY N COX Diagnosing Phys: Bartholome Bill MD IMPRESSIONS  1. Left ventricular ejection fraction, by estimation, is 60 to 65%. The left ventricle has normal function. The left ventricle has no regional wall motion abnormalities. There is mild left ventricular hypertrophy. Left ventricular diastolic parameters are consistent with Grade I diastolic dysfunction (impaired relaxation).  2. Right ventricular systolic function is normal. The right ventricular size is normal.  3. The mitral valve was not well visualized. Trivial mitral valve regurgitation.  4. The aortic valve was not well visualized. Aortic valve regurgitation is trivial. FINDINGS  Left Ventricle: Left ventricular ejection fraction, by estimation, is 60 to 65%. The left ventricle has normal function. The left ventricle has no regional wall motion abnormalities. The left ventricular internal cavity size was normal in size. There is  mild left ventricular hypertrophy. Left ventricular diastolic parameters are consistent with Grade I diastolic dysfunction (impaired relaxation). Right Ventricle: The right ventricular size is normal. No increase in right ventricular wall thickness. Right ventricular systolic function is normal. Left Atrium: Left atrial size was normal in size. Right Atrium: Right atrial size was normal in size. Pericardium: There is no evidence of pericardial effusion. Mitral Valve: The mitral valve was not well visualized. Trivial mitral valve regurgitation. MV peak gradient, 3.2 mmHg. The mean mitral valve gradient is 2.0 mmHg. Tricuspid Valve: The tricuspid valve is not  well visualized. Tricuspid valve regurgitation is trivial. Aortic Valve: The aortic valve was not well visualized. Aortic valve regurgitation is trivial. Aortic valve mean gradient measures 4.0 mmHg. Aortic valve peak gradient measures  7.6 mmHg. Aortic valve area, by VTI measures 2.44 cm. Pulmonic Valve: The pulmonic valve was not well visualized. Pulmonic valve regurgitation is trivial. Aorta: The aortic root is normal in size and structure. IAS/Shunts: The atrial septum is grossly normal.  LEFT VENTRICLE PLAX 2D LVIDd:         4.46 cm  Diastology LVIDs:         2.75 cm  LV e' medial:    8.16 cm/s LV PW:         1.02 cm  LV E/e' medial:  7.7 LV IVS:        0.74 cm  LV e' lateral:   11.40 cm/s LVOT diam:     2.00 cm  LV E/e' lateral: 5.5 LV SV:         58 LV SV Index:   31 LVOT Area:     3.14 cm  RIGHT VENTRICLE RV Basal diam:  2.29 cm LEFT ATRIUM             Index       RIGHT ATRIUM          Index LA diam:        3.60 cm 1.92 cm/m  RA Area:     6.10 cm LA Vol (A2C):   30.6 ml 16.36 ml/m RA Volume:   8.24 ml  4.40 ml/m LA Vol (A4C):   22.6 ml 12.08 ml/m LA Biplane Vol: 26.4 ml 14.11 ml/m  AORTIC VALVE                   PULMONIC VALVE AV Area (Vmax):    2.24 cm    PV Vmax:       1.19 m/s AV Area (Vmean):   2.04 cm    PV Vmean:      77.100 cm/s AV Area (VTI):     2.44 cm    PV VTI:        0.227 m AV Vmax:           138.00 cm/s PV Peak grad:  5.7 mmHg AV Vmean:          98.200 cm/s PV Mean grad:  3.0 mmHg AV VTI:            0.238 m AV Peak Grad:      7.6 mmHg AV Mean Grad:      4.0 mmHg LVOT Vmax:         98.30 cm/s LVOT Vmean:        63.700 cm/s LVOT VTI:          0.185 m LVOT/AV VTI ratio: 0.78  AORTA Ao Root diam: 2.80 cm MITRAL VALVE MV Area (PHT): 3.48 cm    SHUNTS MV Peak grad:  3.2 mmHg    Systemic VTI:  0.18 m MV Mean grad:  2.0 mmHg    Systemic Diam: 2.00 cm MV Vmax:       0.89 m/s MV Vmean:      59.1 cm/s MV Decel Time: 218 msec MV E velocity: 62.60 cm/s MV A velocity: 80.00 cm/s MV E/A ratio:   0.78 Bartholome Bill MD Electronically signed by Bartholome Bill MD Signature Date/Time: 08/02/2020/2:03:37 PM    Final     Cardiac Studies   Echo 08/02/20 1. Left ventricular ejection fraction, by estimation, is 60 to 65%. The  left ventricle has normal function. The left ventricle has no regional  wall motion abnormalities. There is mild left ventricular hypertrophy.  Left  ventricular diastolic parameters  are consistent with Grade I diastolic dysfunction (impaired relaxation).  2. Right ventricular systolic function is normal. The right ventricular  size is normal.  3. The mitral valve was not well visualized. Trivial mitral valve  regurgitation.  4. The aortic valve was not well visualized. Aortic valve regurgitation  is trivial.   Patient Profile     64 y.o. female with history of atrial fibrillation 2019, COPD, long smoking history, chronic bronchitis, and evaluated today for paroxysmal atrial fibrillation.  Assessment & Plan    Paroxysmal atrial fibrillation --NSR today. Converted to NSR on IV diltiazem on review of EMR. --Reportedly PAF is a known dx from 2019, confirmed by pt. --Likely triggered by hypokalemia in the setting of her emesis.  --Replete electrolytes. K 3.4. Replete K with goal 4.0. Mg at 1.9 with goal 2.0. --TSH borderline low.  Recommend continue to monitor closely. --Continue diltiazem 60mg  q12h for rate control.  --CHA2DS2VASc score of at least  2 (female). --Started on [place] by IM with Eliquis 5mg  BID. Recommend close monitoring of anemia with CBC at RTC. Hgb 9.9 with HCT 30.7. She does report a history of microscopic hematuria with UA obtained this admission positive for Hgb and work-up pending by urology but denies any acute bleeding including melena, BRBPR, hematochezia.  Discussed with rounding MD and will continue on [facility] with close follow-up in office next week and CBC. Reviewed precautions regarding long-term anticoagulation. At RTC, recommend CBC,  BMET.  Anemia --Recommend close monitoring of anemia with follow-up CBC at RTC. Follow-up with urology as previously recommended.  Hypokalemia, severe --K 3.4. Replete with goal 4.0. --Will need ongoing supplementation.  --Check Mg with goal 2.0.  G1DD --Echo as above.  On chronic nasal cannula 2 L.  Reports breathing well and at her baseline.  Euvolemic and well compensated on exam.  COPD exacerbations with bronchitis --Per IM  For questions or updates, please contact [facility] Please consult [email or web address] for contact info under        Signed, Arvil Chaco, PA-C  08/03/2020, 2:38 PM

## 2020-08-03 NOTE — ED Notes (Signed)
Medication given late, was not sent up by pharmacy
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
## 2020-08-03 NOTE — Plan of Care (Signed)

## 2020-08-03 NOTE — Telephone Encounter (Signed)
Attempted to schedule.  LMOV to call office.  ° °

## 2020-08-03 NOTE — Discharge Instructions (Signed)

## 2020-08-03 NOTE — Telephone Encounter (Signed)
-----   Message from Arvil Chaco, PA-C sent at 08/03/2020  3:25 PM EST ----- Regarding: TCM within 1 week Hi there,  Hello,  This patient was admitted and seen at [facility] for paroxysmal atrial fibrillation with RVR.  We are expecting discharge today.  Can you please call and arrange / schedule for follow-up TCM appointment with Dr. Rockey Situ or APP within the next 1 week (per Dr. Fletcher Anon, and given her anemia)?   She will need a CBC and BMET at that time.   Thank you! Signed, Arvil Chaco, PA-C 08/03/2020, 3:25 PM Pager [phone]

## 2020-08-09 ENCOUNTER — Encounter: Payer: Self-pay | Admitting: Cardiology

## 2020-08-09 ENCOUNTER — Other Ambulatory Visit: Payer: Self-pay

## 2020-08-09 ENCOUNTER — Ambulatory Visit: Payer: Medicare Other | Admitting: Cardiology

## 2020-08-09 VITALS — BP 140/76 | HR 83 | Ht 64.0 in | Wt 182.0 lb

## 2020-08-09 DIAGNOSIS — I480 Paroxysmal atrial fibrillation: Secondary | ICD-10-CM | POA: Diagnosis not present

## 2020-08-09 DIAGNOSIS — E7800 Pure hypercholesterolemia, unspecified: Secondary | ICD-10-CM

## 2020-08-09 DIAGNOSIS — R0602 Shortness of breath: Secondary | ICD-10-CM

## 2020-08-09 NOTE — Patient Instructions (Addendum)
Medication Instructions:   Your physician recommends that you continue on your current medications as directed. Please refer to the Current Medication list given to you today.  *If you need a refill on your cardiac medications before your next appointment, please call your pharmacy*   Lab Work: Fasting Lipid drawn today  If you have labs (blood work) drawn today and your tests are completely normal, you will receive your results only by: Marland Kitchen MyChart Message (if you have MyChart) OR . A paper copy in the mail If you have any lab test that is abnormal or we need to change your treatment, we will call you to review the results.   Testing/Procedures: None Ordered   Follow-Up: At [facility], you and your health needs are our priority.  As part of our continuing mission to provide you with exceptional heart care, we have created designated Provider Care Teams.  These Care Teams include your primary Cardiologist (physician) and Advanced Practice Providers (APPs -  Physician Assistants and Nurse Practitioners) who all work together to provide you with the care you need, when you need it.  We recommend signing up for the patient portal called "MyChart".  Sign up information is provided on this After Visit Summary.  MyChart is used to connect with patients for Virtual Visits (Telemedicine).  Patients are able to view lab/test results, encounter notes, upcoming appointments, etc.  Non-urgent messages can be sent to your provider as well.   To learn more about what you can do with MyChart, go to [email or web address].    Your next appointment:   6 month(s)  The format for your next appointment:   In Person  Provider:   [name], MD   Other Instructions
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
## 2020-08-10 LAB — LIPID PANEL
Chol/HDL Ratio: 4.2 ratio (ref 0.0–4.4)
Cholesterol, Total: 188 mg/dL (ref 100–199)
HDL: 45 mg/dL (ref >39)
LDL Chol Calc (NIH): 115 mg/dL — ABNORMAL HIGH (ref 0–99)
Triglycerides: 161 mg/dL — ABNORMAL HIGH (ref 0–149)
VLDL Cholesterol Cal: 28 mg/dL (ref 5–40)

## 2020-08-14 MED ORDER — ATORVASTATIN CALCIUM 20 MG PO TABS: 20.0000 mg | ORAL_TABLET | Freq: Every day | ORAL | 5 refills | Status: DC

## 2020-08-14 NOTE — Telephone Encounter (Signed)
Called patient and gave the following copied and pasted result note:  "LDL elevated, triglycerides elevated. Start Lipitor 20 mg daily."  Prescription sent in, patient verbalized understanding and agreed with plan.

## 2020-08-20 ENCOUNTER — Ambulatory Visit: Payer: Medicare Other

## 2020-08-22 ENCOUNTER — Other Ambulatory Visit: Payer: Self-pay | Admitting: Urology

## 2020-08-29 ENCOUNTER — Other Ambulatory Visit: Payer: Self-pay | Admitting: Urology

## 2020-08-31 ENCOUNTER — Telehealth: Payer: Self-pay | Admitting: Cardiology

## 2020-08-31 MED ORDER — ATORVASTATIN CALCIUM 20 MG PO TABS: 20.0000 mg | ORAL_TABLET | Freq: Every day | ORAL | 1 refills | Status: DC

## 2020-08-31 MED ORDER — APIXABAN 5 MG PO TABS: 5.0000 mg | ORAL_TABLET | Freq: Two times a day (BID) | ORAL | 1 refills | Status: DC

## 2020-08-31 NOTE — Telephone Encounter (Signed)
Please see refill request for Eliquis.  Requested Prescriptions   Signed Prescriptions Disp Refills   atorvastatin (LIPITOR) 20 MG tablet 90 tablet 1    Sig: Take 1 tablet (20 mg total) by mouth daily.    Authorizing Provider: [name]    Ordering User: Raelene Bott, Yun Gutierrez L

## 2020-08-31 NOTE — Telephone Encounter (Signed)
Pt's age 65, wt 82.6 kg, SCr 0.9, CrCl 80.18, last ov w/ BA 08/09/20.

## 2020-08-31 NOTE — Telephone Encounter (Signed)
*  STAT* If patient is at the pharmacy, call can be transferred to refill team.   1. Which medications need to be refilled? (please list name of each medication and dose if known) eliquis 5mg  bid  2. Which pharmacy/location (including street and city if local pharmacy) is medication to be sent to? [facility] in [place]  3. Do they need a 30 day or 90 day supply? [place]

## 2020-08-31 NOTE — Telephone Encounter (Signed)
Also atorvastatin 90 days

## 2020-08-31 NOTE — Addendum Note (Signed)
Addended by: Dede Query R on: 08/31/2020 02:54 PM   Modules accepted: Orders

## 2020-09-17 ENCOUNTER — Ambulatory Visit
Admission: RE | Admit: 2020-09-17 | Discharge: 2020-09-17 | Disposition: A | Discharge status: Home / Self Care | Payer: Medicare Other | Source: Ambulatory Visit | Attending: Urology | Admitting: Urology

## 2020-09-17 ENCOUNTER — Other Ambulatory Visit: Payer: Self-pay

## 2020-09-17 DIAGNOSIS — R3129 Other microscopic hematuria: Secondary | ICD-10-CM | POA: Insufficient documentation

## 2020-09-17 LAB — POCT I-STAT CREATININE: Creatinine, Ser: 0.8 mg/dL (ref 0.44–1.00)

## 2020-09-17 MED ORDER — IOHEXOL 300 MG/ML  SOLN
125.0000 mL | Freq: Once | INTRAMUSCULAR | Status: AC | PRN
  Administered 2020-09-17: 125 mL via INTRAVENOUS

## 2020-09-18 ENCOUNTER — Other Ambulatory Visit: Payer: Medicare Other | Admitting: Urology

## 2020-09-26 ENCOUNTER — Ambulatory Visit: Payer: Medicare Other | Admitting: Urology

## 2020-09-26 ENCOUNTER — Encounter: Payer: Self-pay | Admitting: Urology

## 2020-09-26 ENCOUNTER — Other Ambulatory Visit: Payer: Self-pay

## 2020-09-26 VITALS — BP 131/74 | HR 103 | Ht 64.0 in

## 2020-09-26 DIAGNOSIS — R3129 Other microscopic hematuria: Secondary | ICD-10-CM

## 2020-09-26 DIAGNOSIS — K439 Ventral hernia without obstruction or gangrene: Secondary | ICD-10-CM

## 2020-09-26 LAB — URINALYSIS, COMPLETE
Bilirubin, UA: NEGATIVE
Glucose, UA: NEGATIVE
Ketones, UA: NEGATIVE
Leukocytes,UA: NEGATIVE
Nitrite, UA: NEGATIVE
Protein,UA: NEGATIVE
Specific Gravity, UA: 1.02 (ref 1.005–1.030)
Urobilinogen, Ur: 0.2 mg/dL (ref 0.2–1.0)
pH, UA: 6.5 (ref 5.0–7.5)

## 2020-09-26 LAB — MICROSCOPIC EXAMINATION: Bacteria, UA: NONE SEEN

## 2020-09-26 NOTE — Progress Notes (Signed)
   09/28/20  CC:  Chief Complaint  Patient presents with  . Cysto    HPI: 65 year old female who presents today for cystoscopic evaluation of microscopic hematuria  She underwent CT urogram on 09/17/2020 which indicated no GU pathology.  This was personally reviewed today.  CT scan does not indicate that she has a lumbar hernia containing her appendix.  She said no surgeries on the side but did have a history of trauma and a "hip pinning" on this side.  She does report that she occasionally has some right lower back quadrant pain.  She sometimes cannot sleep on that side.  She is interested in general surgery referral.  Vitals:   09/26/20 1513  BP: 131/74  Pulse: (!) 103   NED. A&Ox3.   No respiratory distress   Abd soft, NT, ND Normal external genitalia with patent urethral meatus  Cystoscopy Procedure Note  Patient identification was confirmed, informed consent was obtained, and patient was prepped using Betadine solution.  Lidocaine jelly was administered per urethral meatus.    Procedure: - Flexible cystoscope introduced, without any difficulty.   - Thorough search of the bladder revealed:    normal urethral meatus    normal urothelium    no stones    no ulcers     no tumors    no urethral polyps    no trabeculation  - Ureteral orifices were normal in position and appearance.  Post-Procedure: - Patient tolerated the procedure well  Assessment/ Plan:  1. Microscopic hematuria Unremarkable cystoscopy today as well as CT urogram  No GU pathology identified  I recommended that she return in 2 to 3 years referred back by her PCP if she has persistent recurrent microscopic hematuria for a modified repeat evaluation.  She is agreeable this plan.  - Urinalysis, Complete  2.  Lumbar hernia Possibly symptomatic on occasion, interested in general surgery referral to discuss further - Ambulatory referral to [place], [place]

## 2020-10-02 ENCOUNTER — Ambulatory Visit: Payer: Self-pay | Admitting: Surgery

## 2020-10-04 ENCOUNTER — Encounter: Payer: Self-pay | Admitting: Surgery

## 2020-10-04 ENCOUNTER — Other Ambulatory Visit: Payer: Self-pay

## 2020-10-04 ENCOUNTER — Ambulatory Visit: Payer: Medicare Other | Admitting: Surgery

## 2020-10-04 VITALS — BP 102/70 | HR 86 | Temp 98.3°F | Ht 64.0 in | Wt 184.8 lb

## 2020-10-04 DIAGNOSIS — K458 Other specified abdominal hernia without obstruction or gangrene: Secondary | ICD-10-CM
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
## 2020-10-04 NOTE — Patient Instructions (Signed)
Please call our office if you have any questions or concerns.  

## 2020-12-05 ENCOUNTER — Other Ambulatory Visit: Payer: Self-pay | Admitting: Internal Medicine

## 2020-12-05 DIAGNOSIS — Z1231 Encounter for screening mammogram for malignant neoplasm of breast: Secondary | ICD-10-CM

## 2020-12-13 ENCOUNTER — Other Ambulatory Visit: Payer: Self-pay

## 2020-12-13 ENCOUNTER — Ambulatory Visit
Admission: RE | Admit: 2020-12-13 | Discharge: 2020-12-13 | Disposition: A | Discharge status: Home / Self Care | Payer: Medicare Other | Source: Ambulatory Visit | Attending: Internal Medicine | Admitting: Internal Medicine

## 2020-12-13 DIAGNOSIS — Z1231 Encounter for screening mammogram for malignant neoplasm of breast: Secondary | ICD-10-CM | POA: Diagnosis present

## 2020-12-18 ENCOUNTER — Other Ambulatory Visit: Payer: Self-pay | Admitting: Internal Medicine

## 2020-12-18 DIAGNOSIS — R928 Other abnormal and inconclusive findings on diagnostic imaging of breast: Secondary | ICD-10-CM

## 2020-12-18 DIAGNOSIS — N6489 Other specified disorders of breast: Secondary | ICD-10-CM

## 2020-12-21 ENCOUNTER — Ambulatory Visit: Payer: Medicare Other

## 2020-12-21 ENCOUNTER — Other Ambulatory Visit: Payer: Medicare Other

## 2020-12-25 ENCOUNTER — Other Ambulatory Visit: Payer: Self-pay

## 2020-12-25 ENCOUNTER — Ambulatory Visit
Admission: RE | Admit: 2020-12-25 | Discharge: 2020-12-25 | Disposition: A | Discharge status: Home / Self Care | Payer: Medicare Other | Source: Ambulatory Visit | Attending: Internal Medicine | Admitting: Internal Medicine

## 2020-12-25 DIAGNOSIS — R928 Other abnormal and inconclusive findings on diagnostic imaging of breast: Secondary | ICD-10-CM

## 2020-12-25 DIAGNOSIS — N6489 Other specified disorders of breast: Secondary | ICD-10-CM | POA: Diagnosis present

## 2021-01-01 ENCOUNTER — Other Ambulatory Visit: Payer: Self-pay | Admitting: Internal Medicine

## 2021-01-01 DIAGNOSIS — N6489 Other specified disorders of breast: Secondary | ICD-10-CM

## 2021-01-01 DIAGNOSIS — R928 Other abnormal and inconclusive findings on diagnostic imaging of breast: Secondary | ICD-10-CM

## 2021-01-08 ENCOUNTER — Other Ambulatory Visit: Payer: Self-pay

## 2021-01-08 ENCOUNTER — Ambulatory Visit
Admission: RE | Admit: 2021-01-08 | Discharge: 2021-01-08 | Disposition: A | Discharge status: Home / Self Care | Payer: Medicare Other | Source: Ambulatory Visit | Attending: Internal Medicine | Admitting: Internal Medicine

## 2021-01-08 DIAGNOSIS — N6489 Other specified disorders of breast: Secondary | ICD-10-CM | POA: Diagnosis present

## 2021-01-08 DIAGNOSIS — R928 Other abnormal and inconclusive findings on diagnostic imaging of breast: Secondary | ICD-10-CM | POA: Diagnosis present

## 2021-01-08 DIAGNOSIS — C50911 Malignant neoplasm of unspecified site of right female breast: Secondary | ICD-10-CM

## 2021-01-08 HISTORY — DX: Malignant neoplasm of unspecified site of right female breast: C50.911

## 2021-01-08 HISTORY — PX: BREAST BIOPSY: SHX20

## 2021-01-09 DIAGNOSIS — C50919 Malignant neoplasm of unspecified site of unspecified female breast: Secondary | ICD-10-CM

## 2021-01-10 ENCOUNTER — Other Ambulatory Visit: Payer: Self-pay

## 2021-01-10 MED ORDER — APIXABAN 5 MG PO TABS: 5.0000 mg | ORAL_TABLET | Freq: Two times a day (BID) | ORAL | 1 refills | Status: DC

## 2021-01-10 NOTE — Telephone Encounter (Signed)
Please review for refill on Eliquis 5 mg  

## 2021-01-10 NOTE — Progress Notes (Signed)
Initiated navigation. Patient requested consults be scheduled when her husband could come with her.  He drives a truck, and schedule is limited.  Scheduled with Dr. Peyton Najjar, and Dr Tasia Catchings on 01/18/21.

## 2021-01-10 NOTE — Telephone Encounter (Signed)
1f, 83.8kg, scr 0.9 12/11/20, lovw/agbor-etang 08/09/20

## 2021-01-15 LAB — SURGICAL PATHOLOGY

## 2021-01-18 ENCOUNTER — Inpatient Hospital Stay: Payer: Medicare Other | Attending: Oncology | Admitting: Oncology

## 2021-01-18 ENCOUNTER — Inpatient Hospital Stay: Payer: Medicare Other

## 2021-01-18 ENCOUNTER — Encounter: Payer: Self-pay | Admitting: Oncology

## 2021-01-18 VITALS — BP 118/76 | HR 93 | Temp 98.4°F | Resp 18 | Ht 64.0 in | Wt 186.9 lb

## 2021-01-18 DIAGNOSIS — J9611 Chronic respiratory failure with hypoxia: Secondary | ICD-10-CM | POA: Diagnosis not present

## 2021-01-18 DIAGNOSIS — Z8616 Personal history of COVID-19: Secondary | ICD-10-CM | POA: Diagnosis not present

## 2021-01-18 DIAGNOSIS — Z87891 Personal history of nicotine dependence: Secondary | ICD-10-CM | POA: Diagnosis not present

## 2021-01-18 DIAGNOSIS — C50211 Malignant neoplasm of upper-inner quadrant of right female breast: Secondary | ICD-10-CM | POA: Diagnosis present

## 2021-01-18 DIAGNOSIS — Z170 Estrogen receptor positive status [ER+]: Secondary | ICD-10-CM | POA: Diagnosis not present

## 2021-01-18 DIAGNOSIS — C50919 Malignant neoplasm of unspecified site of unspecified female breast: Secondary | ICD-10-CM

## 2021-01-18 DIAGNOSIS — J449 Chronic obstructive pulmonary disease, unspecified: Secondary | ICD-10-CM | POA: Insufficient documentation

## 2021-01-18 DIAGNOSIS — Z9981 Dependence on supplemental oxygen: Secondary | ICD-10-CM | POA: Diagnosis not present

## 2021-01-18 DIAGNOSIS — Z803 Family history of malignant neoplasm of breast: Secondary | ICD-10-CM | POA: Diagnosis not present

## 2021-01-18 DIAGNOSIS — Z7189 Other specified counseling: Secondary | ICD-10-CM

## 2021-01-18 NOTE — Progress Notes (Signed)
Patient here to establish care for invasive carcinoma of breast.

## 2021-01-19 DIAGNOSIS — J9611 Chronic respiratory failure with hypoxia: Secondary | ICD-10-CM | POA: Insufficient documentation

## 2021-01-19 DIAGNOSIS — C50919 Malignant neoplasm of unspecified site of unspecified female breast: Secondary | ICD-10-CM | POA: Insufficient documentation
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
## 2021-01-22 ENCOUNTER — Telehealth: Payer: Self-pay | Admitting: Oncology

## 2021-01-22 NOTE — Telephone Encounter (Signed)
Spoke with patient about coming in at 10am for lab work prior to her 11am MRI. She was agreeable.

## 2021-01-25 ENCOUNTER — Ambulatory Visit
Admission: RE | Admit: 2021-01-25 | Discharge: 2021-01-25 | Disposition: A | Discharge status: Home / Self Care | Payer: Medicare Other | Source: Ambulatory Visit | Attending: Oncology | Admitting: Oncology

## 2021-01-25 ENCOUNTER — Inpatient Hospital Stay: Payer: Medicare Other

## 2021-01-25 ENCOUNTER — Other Ambulatory Visit: Payer: Self-pay

## 2021-01-25 DIAGNOSIS — C50919 Malignant neoplasm of unspecified site of unspecified female breast: Secondary | ICD-10-CM | POA: Insufficient documentation

## 2021-01-25 DIAGNOSIS — C50211 Malignant neoplasm of upper-inner quadrant of right female breast: Secondary | ICD-10-CM | POA: Diagnosis not present

## 2021-01-25 LAB — COMPREHENSIVE METABOLIC PANEL
ALT: 12 U/L (ref 0–44)
AST: 20 U/L (ref 15–41)
Albumin: 4.1 g/dL (ref 3.5–5.0)
Alkaline Phosphatase: 57 U/L (ref 38–126)
Anion gap: 12 (ref 5–15)
BUN: 10 mg/dL (ref 8–23)
CO2: 30 mmol/L (ref 22–32)
Calcium: 9.2 mg/dL (ref 8.9–10.3)
Chloride: 99 mmol/L (ref 98–111)
Creatinine, Ser: 0.78 mg/dL (ref 0.44–1.00)
GFR, Estimated: >60 mL/min (ref >60)
Glucose, Bld: 108 mg/dL — ABNORMAL HIGH (ref 70–99)
Potassium: 3.7 mmol/L (ref 3.5–5.1)
Sodium: 141 mmol/L (ref 135–145)
Total Bilirubin: 0.2 mg/dL — ABNORMAL LOW (ref 0.3–1.2)
Total Protein: 8.1 g/dL (ref 6.5–8.1)

## 2021-01-25 LAB — CBC WITH DIFFERENTIAL/PLATELET
Abs Immature Granulocytes: 0.04 10*3/uL (ref 0.00–0.07)
Basophils Absolute: 0.1 10*3/uL (ref 0.0–0.1)
Basophils Relative: 1 %
Eosinophils Absolute: 0.5 10*3/uL (ref 0.0–0.5)
Eosinophils Relative: 5 %
HCT: 36.7 % (ref 36.0–46.0)
Hemoglobin: 11.8 g/dL — ABNORMAL LOW (ref 12.0–15.0)
Immature Granulocytes: 0 %
Lymphocytes Relative: 27 %
Lymphs Abs: 2.4 10*3/uL (ref 0.7–4.0)
MCH: 29.5 pg (ref 26.0–34.0)
MCHC: 32.2 g/dL (ref 30.0–36.0)
MCV: 91.8 fL (ref 80.0–100.0)
Monocytes Absolute: 0.6 10*3/uL (ref 0.1–1.0)
Monocytes Relative: 7 %
Neutro Abs: 5.3 10*3/uL (ref 1.7–7.7)
Neutrophils Relative %: 60 %
Platelets: 335 10*3/uL (ref 150–400)
RBC: 4 MIL/uL (ref 3.87–5.11)
RDW: 14.9 % (ref 11.5–15.5)
WBC: 8.9 10*3/uL (ref 4.0–10.5)
nRBC: 0 % (ref 0.0–0.2)

## 2021-01-25 MED ORDER — GADOBUTROL 1 MMOL/ML IV SOLN
9.0000 mL | Freq: Once | INTRAVENOUS | Status: AC | PRN
  Administered 2021-01-25: 9 mL via INTRAVENOUS

## 2021-01-26 LAB — CANCER ANTIGEN 27.29: CA 27.29: 18.3 U/mL (ref 0.0–38.6)

## 2021-01-26 LAB — CANCER ANTIGEN 15-3: CA 15-3: 18.3 U/mL (ref 0.0–25.0)

## 2021-01-28 ENCOUNTER — Encounter: Payer: Self-pay | Admitting: Oncology

## 2021-01-28 NOTE — Progress Notes (Signed)
START ON PATHWAY REGIMEN - Breast     A cycle is every 21 days:     Pertuzumab      Pertuzumab      Trastuzumab-xxxx      Trastuzumab-xxxx      Carboplatin      Docetaxel   **Always confirm dose/schedule in your pharmacy ordering system**  Patient Characteristics: Preoperative or Nonsurgical Candidate (Clinical Staging), Neoadjuvant Therapy followed by Surgery, Invasive Disease, Chemotherapy, HER2 Positive, ER Positive Therapeutic Status: Preoperative or Nonsurgical Candidate (Clinical Staging) AJCC M Category: cM0 AJCC Grade: G1 Breast Surgical Plan: Neoadjuvant Therapy followed by Surgery ER Status: Positive (+) AJCC 8 Stage Grouping: IB HER2 Status: Positive (+) AJCC T Category: cT2 AJCC N Category: cN0 PR Status: Positive (+) Intent of Therapy: Curative Intent, Discussed with Patient

## 2021-01-29 ENCOUNTER — Other Ambulatory Visit: Payer: Self-pay | Admitting: Oncology

## 2021-01-29 ENCOUNTER — Telehealth: Payer: Self-pay | Admitting: Oncology

## 2021-01-29 ENCOUNTER — Telehealth: Payer: Self-pay

## 2021-01-29 DIAGNOSIS — C50919 Malignant neoplasm of unspecified site of unspecified female breast: Secondary | ICD-10-CM

## 2021-01-29 MED ORDER — PROCHLORPERAZINE MALEATE 10 MG PO TABS: 10.0000 mg | ORAL_TABLET | Freq: Four times a day (QID) | ORAL | 1 refills | Status: DC | PRN

## 2021-01-29 MED ORDER — DEXAMETHASONE 4 MG PO TABS: 8.0000 mg | ORAL_TABLET | Freq: Two times a day (BID) | ORAL | 1 refills | Status: DC

## 2021-01-29 MED ORDER — LIDOCAINE-PRILOCAINE 2.5-2.5 % EX CREA: 1.0000 "application " | TOPICAL_CREAM | CUTANEOUS | 6 refills | Status: DC | PRN

## 2021-01-29 MED ORDER — ONDANSETRON HCL 8 MG PO TABS: 8.0000 mg | ORAL_TABLET | Freq: Two times a day (BID) | ORAL | 1 refills | Status: DC | PRN

## 2021-01-29 NOTE — Telephone Encounter (Signed)
Debra [name] spoke to patient:  Notified patient of plan for neoadjuvant chemotherapy, and rationale.  Dr. Tasia Catchings she would like for you to call her just to review.  Dr. Peyton Najjar is her surgeon.  I explained an order would be sent to his office for port placement.  Explained Echo 2D;  Her husband I out of town until 6/27.  She would like towait until he returns

## 2021-01-29 NOTE — Telephone Encounter (Signed)
Referral placed for port a cath placement.  Please schedule patient for echo, chemo education with [facility].  Patient prefers to have these appts scheduled after 6/27 when her husband has returned from out of town.

## 2021-01-29 NOTE — Progress Notes (Signed)
Phoned patient per Dr. Tasia Catchings request to Discuss treatment plan based on MRI results.   Plan of neoadjuvant chemotherapy previously discussed with patient.  Discussed requirements for plan to proceed.  Patient's husband out of town until 02/11/21.   She requests Dr. Tasia Catchings call to review plan, and that pre-treatment/treatment start after her husband returns.

## 2021-01-29 NOTE — Telephone Encounter (Signed)
-----   Message from Earlie Server, MD sent at 01/28/2021 11:47 PM EDT ----- Please let patient know that on MRI, the tumor size appears larger than the size on mammogram She has T2 disease, I would offer her neoadjuvant chemotherapy- discussed about this possibility. Please arrange her to get complete 2D echo ASAP, chemo education with [facility], her choice of medi port by Dr.Rodenberg, not required to get port, optional. If she desires further discussion, arrange another visit with me.    ----- Message ----- From: Interface, Rad Results In Sent: 01/25/2021   1:23 PM EDT To: Earlie Server, MD

## 2021-01-29 NOTE — Telephone Encounter (Signed)
I called patient and updated her about MRI result. Tumor size 2.5 cm, I recommend neoadjuvant chemotherapy with [facility]. Patient will be scheduled to have echo or [facility], chemotherapy education, patient opts to have Mediport placed and have asked Dr. Peyton Najjar to place. Premedication including dexamethasone, Zofran, Emla, Compazine were sent to pharmacy.  Advised patient to bring premedication to chemotherapy education if she has questions. Once we have above done, we will schedule her to have lab [facility]

## 2021-01-29 NOTE — Progress Notes (Signed)
Phone note created 

## 2021-01-29 NOTE — Progress Notes (Signed)
Notified patient of plan for neoadjuvant chemotherapy, and rationale.  Dr. Tasia Catchings she would like for you to call her just to review.  Dr. Peyton Najjar is her surgeon.  I explained an order would be sent to his office for port placement.  Explained Echo 2D;  Her husband I out of town until 6/27.  She would like to wait until he returns .

## 2021-01-29 NOTE — Telephone Encounter (Signed)
Debra [name], will you contact patient to let let her know what Dr. Tasia Catchings has recommended?  Also ask her if she would like to have a port placed.

## 2021-01-30 ENCOUNTER — Encounter: Payer: Self-pay | Admitting: Oncology

## 2021-01-30 NOTE — Telephone Encounter (Signed)
Spoke with patient to clarify when she would like to schedule appointments requested by Dr. Tasia Catchings.  She states that she would like to hold off on scheduling until her husband returns on 6/27.  She sent him a text to see if he could return sooner and if he does we can proceed with scheduling.  Patient has my direct number and will call me to let me know when to proceed.  Clinical team and Dr. Peyton Najjar made aware.

## 2021-02-01 NOTE — Telephone Encounter (Signed)
Spoke to patient and she states that she would like to get appts until after 6/27. Husband wants to be present during chemo edu. Please schedule appts per MD recommendation and notify pt of appts. Thanks

## 2021-02-04 ENCOUNTER — Ambulatory Visit: Payer: Medicare Other | Admitting: Cardiology

## 2021-02-04 NOTE — Telephone Encounter (Signed)
Please arrange her to get complete 2D echo & chemo education with [facility] and notify pt of appts. Pt requesting to be scheduled until after 6/27.

## 2021-02-05 ENCOUNTER — Other Ambulatory Visit: Payer: Self-pay | Admitting: Oncology

## 2021-02-05 ENCOUNTER — Other Ambulatory Visit: Payer: Self-pay

## 2021-02-05 ENCOUNTER — Ambulatory Visit (INDEPENDENT_AMBULATORY_CARE_PROVIDER_SITE_OTHER): Payer: Medicare Other

## 2021-02-05 DIAGNOSIS — I4891 Unspecified atrial fibrillation: Secondary | ICD-10-CM

## 2021-02-05 LAB — ECHOCARDIOGRAM COMPLETE
AR max vel: 2.53 cm2
AV Area VTI: 2.52 cm2
AV Area mean vel: 2.64 cm2
AV Mean grad: 3 mmHg
AV Peak grad: 6.2 mmHg
Ao pk vel: 1.24 m/s
Area-P 1/2: 3.19 cm2
Calc EF: 47.8 %
S' Lateral: 3.85 cm
Single Plane A2C EF: 48.8 %
Single Plane A4C EF: 46.2 %

## 2021-02-07 ENCOUNTER — Encounter: Payer: Self-pay | Admitting: Oncology

## 2021-02-07 ENCOUNTER — Ambulatory Visit: Payer: Self-pay | Admitting: General Surgery

## 2021-02-07 NOTE — H&P (Signed)
PATIENT PROFILE: Debra [name] is a 65 y.o. female who presents to the Clinic for consultation at the request of Dr. Ginette Pitman for evaluation of Right breast cancer.   PCP:  Azzie Glatter, MD   HISTORY OF PRESENT ILLNESS: Debra [name] reports she had her usual screening mammogram.  Screening mammogram shows a distortion of the right breast.  No ultrasonogram findings.  Mammography findings shows a distortion about 1 cm.  Stereotactic core biopsy done.  This showed invasive mammary carcinoma.  The invasive component measured 5 mm.  Markers are ER/PR positive HER2/neu positive.   Patient denies any breast pain, any palpable mass, any nipple discharge, any skin changes.   Family history of breast cancer: sister Family history of other cancers: brothers (lung cancer) Menarche: 37 years old Menopause: 65 years old Used OCP: denies Used estrogen and progesterone therapy: denies  History of Radiation to the chest: none Number of pregnancies: 2 Age of first pregnancy: 1 Previous breast biopsy: left breast (benign)   PROBLEM LIST:         Problem List  Date Reviewed: 11/26/2020          Noted    History of 2019 novel coronavirus disease (COVID-19) 12/12/2019    Chronic respiratory failure with hypoxia (CMS-HCC) 10/05/2019    Rash 09/24/2019    Osteoporosis 05/04/2018    GAD (generalized anxiety disorder) 05/04/2018    Ex-smoker 05/04/2018    Anemia 12/29/2017    Low serum vitamin B12 12/29/2017    Other chronic pain 12/29/2017    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               GENERAL REVIEW OF SYSTEMS:    General ROS: negative for - chills, fatigue, fever, weight gain or weight loss Allergy and Immunology ROS: negative for - hives  Hematological and Lymphatic ROS: negative for - bleeding problems or  bruising, negative for palpable nodes Endocrine ROS: negative for - heat or cold intolerance, hair changes Respiratory ROS: Positive for - cough, shortness of breath or wheezing Cardiovascular ROS: no chest pain or palpitations GI ROS: negative for nausea, vomiting, abdominal pain, diarrhea, constipation Musculoskeletal ROS: negative for - joint swelling or muscle pain Neurological ROS: negative for - confusion, syncope Dermatological ROS: negative for pruritus and rash Psychiatric: negative for anxiety, depression, difficulty sleeping and memory loss   MEDICATIONS:       Current Outpatient Medications  Medication Sig Dispense Refill   ADVAIR HFA 115-21 mcg/actuation inhaler INHALE 1 PUFF BY MOUTH EVERY 12 HOURS AS DIRECTED RINSE MOUTH AFTER EACH USE 12 g 3   albuterol 90 mcg/actuation inhaler INHALE 2 INHALATIONS INTO THE LUNGS EVERY 6 (SIX) HOURS AS NEEDED 1 each 5   alendronate (FOSAMAX) 70 MG tablet TAKE 1 TABLET (70 MG TOTAL) BY MOUTH EVERY 7 (SEVEN) DAYS FOR 180 DAYS TAKE WITH A FULL GLASS OF WATER. DO NOT LIE DOWN FOR THE NEXT 30 MIN. 12 tablet 1   citalopram (CELEXA) 10 MG tablet TAKE 1 TABLET BY MOUTH EVERY DAY 90 tablet 1   cyanocobalamin, vitamin B-12, 1,000 mcg Cap TAKE 1 CAPSULE BY MOUTH ONCE DAILY 90 capsule 1   ELIQUIS 5 mg tablet Take 5 mg by mouth 2 (two) times daily  fluticasone-umeclidinium-vilanterol (TRELEGY ELLIPTA) 100-62.5-25 mcg inhaler Inhale 1 inhalation into the lungs once daily DO NOT USE ADVAIR OR SPIRIVA 1 each 11   LYRICA 150 mg capsule Take 1 capsule (150 mg total) by mouth 2 (two) times daily 60 capsule 3   NARCAN 4 mg/actuation For suspected opioid overdose, support neck, allow head to tilt back, use 1 spray in 1 nostril, if needed, wait 2-3 minutes, spray in other   0   ondansetron (ZOFRAN-ODT) 4 MG disintegrating tablet TAKE 1 TABLET (4 MG TOTAL) BY MOUTH 2 (TWO) TIMES DAILY AS NEEDED FOR NAUSEA 60 tablet 1   oxyCODONE-acetaminophen (PERCOCET) 7.5-325  mg tablet TAKE 1 TABLET BY MOUTH 5 TIMES A DAY AS NEEDED FOR PAIN       potassium chloride (KLOR-CON) 10 MEQ ER tablet TAKE 1 TABLET BY MOUTH ONCE DAILY 90 tablet 1   SPIRIVA WITH HANDIHALER 18 mcg inhalation capsule PLACE ONE CAPSULE INTO INHALER AND INHALE ONCE DAILY AS DIRECTED 30 capsule 5   tiZANidine (ZANAFLEX) 2 MG tablet TAKE 1 TABLET (2 MG TOTAL) BY MOUTH 3 (THREE) TIMES DAILY FOR 30 DAYS 90 tablet 1   albuterol (PROVENTIL) 2.5 mg /3 mL (0.083 %) nebulizer solution Take 3 mLs (2.5 mg total) by nebulization every 6 (six) hours as needed for Wheezing 360 mL 11   DALIRESP 500 mcg tablet TAKE 1 TABLET (0.5 MG TOTAL) BY MOUTH ONCE DAILY (Patient not taking: Reported on 01/18/2021) 90 tablet 3   IRON POLYSACCHARIDES 150 mg iron capsule TAKE ONE CAPSULE BY MOUTH DAILY (Patient not taking: Reported on 01/18/2021) 90 capsule 1   L. acidophilus-L. rhamnosus (PROBIOTIC) 15 billion cell Cap Take 1 capsule by mouth once daily (Patient not taking: Reported on 01/18/2021)       potassium chloride (MICRO-K) 10 MEQ ER capsule TAKE 1 TABLET BY MOUTH DAILY (Patient not taking: Reported on 01/18/2021) 30 capsule 11   rOPINIRole (REQUIP) 0.25 MG tablet Take 0.25 mg by mouth nightly        No current facility-administered medications for this visit.      ALLERGIES: Strawberry   PAST MEDICAL HISTORY:     Past Medical History:  Diagnosis Date   COPD (chronic obstructive pulmonary disease) (CMS-HCC)     Depression     Migraine        PAST SURGICAL HISTORY:      Past Surgical History:  Procedure Laterality Date   Right hip pinning Right 04/26/14      FAMILY HISTORY:      Family History  Problem Relation Age of Onset   High blood pressure (Hypertension) Mother     Asthma Mother     Myocardial Infarction (Heart attack) Mother     Stroke Mother     Cancer Mother     No Known Problems Father        SOCIAL HISTORY: Social History         Socioeconomic History   Marital status: Married  Tobacco  Use   Smoking status: Former Smoker      Packs/day: 1.00      Years: 40.00      Pack years: 40.00      Types: Cigarettes      Quit date: 11/16/2013      Years since quitting: 7.1   Smokeless tobacco: Never Used  Vaping Use   Vaping Use: Never used  Substance and Sexual Activity   Alcohol use: No   Drug use: No   Sexual activity:  Defer      PHYSICAL EXAM:    Vitals:    01/18/21 0938  BP: 134/77  Pulse: 95    Body mass index is 32.1 kg/m. Weight: 84.8 kg (187 lb)    GENERAL: Alert, active, oriented x3   HEENT: Pupils equal reactive to light. Extraocular movements are intact. Sclera clear. Palpebral conjunctiva normal red color.Pharynx clear.   NECK: Supple with no palpable mass and no adenopathy.   LUNGS: Sound clear with no rales rhonchi or wheezes.   HEART: Regular rhythm S1 and S2 without murmur.   BREAST: breasts appear normal, no suspicious masses, no skin or nipple changes or axillary nodes.   ABDOMEN: Soft and depressible, nontender with no palpable mass, no hepatomegaly.   EXTREMITIES: Well-developed well-nourished symmetrical with no dependent edema.   NEUROLOGICAL: Awake alert oriented, facial expression symmetrical, moving all extremities.   REVIEW OF DATA: I have reviewed the following data today:      Appointment on 12/11/2020  Component Date Value   WBC (White Blood Cell Co* 12/11/2020 7.0    RBC (Red Blood Cell Coun* 12/11/2020 3.61 (!)   Hemoglobin 12/11/2020 10.5 (!)   Hematocrit 12/11/2020 33.5 (!)   MCV (Mean Corpuscular Vo* 12/11/2020 92.8    MCH (Mean Corpuscular He* 12/11/2020 29.1    MCHC (Mean Corpuscular H* 12/11/2020 31.3 (!)   Platelet Count 12/11/2020 328    RDW-CV (Red Cell Distrib* 12/11/2020 13.8    MPV (Mean Platelet Volum* 12/11/2020 9.2 (!)   Neutrophils 12/11/2020 3.85    Lymphocytes 12/11/2020 2.24    Monocytes 12/11/2020 0.54    Eosinophils 12/11/2020 0.33    Basophils 12/11/2020 0.05    Neutrophil % 12/11/2020 54.9     Lymphocyte % 12/11/2020 31.9    Monocyte % 12/11/2020 7.7    Eosinophil % 12/11/2020 4.7    Basophil% 12/11/2020 0.7    Immature Granulocyte % 12/11/2020 0.1    Immature Granulocyte Cou* 12/11/2020 0.01    Glucose 12/11/2020 128 (!)   Sodium 12/11/2020 139    Potassium 12/11/2020 4.0    Chloride 12/11/2020 100    Carbon Dioxide (CO2) 12/11/2020 36.3 (!)   Urea Nitrogen (BUN) 12/11/2020 8    Creatinine 12/11/2020 0.9    Glomerular Filtration Ra* 12/11/2020 63    Calcium 12/11/2020 9.1    AST  12/11/2020 11    ALT  12/11/2020 9    Alk Phos (alkaline Phosp* 12/11/2020 55    Albumin 12/11/2020 3.6    Bilirubin, Total 12/11/2020 0.3    Protein, Total 12/11/2020 6.5    A/G Ratio 12/11/2020 1.2    Cholesterol, Total 12/11/2020 192    Triglyceride 12/11/2020 215 (!)   HDL (High Density Lipopr* 12/11/2020 42.7    LDL Calculated 12/11/2020 106    VLDL Cholesterol 12/11/2020 43    Cholesterol/HDL Ratio 12/11/2020 4.5    Urine Albumin, Random 12/11/2020 16    Thyroid Stimulating Horm* 12/11/2020 1.306    Hemoglobin A1C 12/11/2020 5.8 (!)   Average Blood Glucose (C* 12/11/2020 120    Vitamin B12 12/11/2020 573    Ferritin 12/11/2020 33       ASSESSMENT: Debra [name] is a 65 y.o. female presenting for consultation for right breast cancer.     Patient was oriented again about the pathology results. Surgical alternatives were discussed with patient including partial vs total mastectomy. Surgical technique and post operative care was discussed with patient. Risk of surgery was discussed with patient including but not limited  to: wound infection, seroma, hematoma, brachial plexopathy, mondor's disease (thrombosis of small veins of breast), chronic wound pain, breast lymphedema, altered sensation to the nipple and cosmesis among others.    Due to patient markers of ER/PR positive and HER2/neu positive will wait for final medical oncology evaluation to continue discussion about neck symptom  management.  Patient oriented about the procedure of partial mastectomy and about the procedure of insertion of Port-A-Cath in case she needs a Chemo-Port.  We will continue communication with the patient regarding further management.   Malignant neoplasm of upper-inner quadrant of right breast in female, estrogen receptor positive (CMS-HCC) [C50.211, Z17.0]   PLAN: Insertion of Port a Cath   Patient verbalized understanding, all questions were answered, and were agreeable with the plan outlined above.

## 2021-02-07 NOTE — H&P (View-Only) (Signed)
PATIENT PROFILE: Debra [name] is a 65 y.o. female who presents to the Clinic for consultation at the request of Debra [name] for evaluation of Right breast cancer.   PCP:  Debra Glatter, [name]   HISTORY OF PRESENT ILLNESS: Debra [name] reports she had her usual screening mammogram.  Screening mammogram shows a distortion of the right breast.  No ultrasonogram findings.  Mammography findings shows a distortion about 1 cm.  Stereotactic core biopsy done.  This showed invasive mammary carcinoma.  The invasive component measured 5 mm.  Markers are ER/PR positive HER2/neu positive.   Patient denies any breast pain, any palpable mass, any nipple discharge, any skin changes.   Family history of breast cancer: sister Family history of other cancers: brothers (lung cancer) Menarche: 37 years old Menopause: 65 years old Used OCP: denies Used estrogen and progesterone therapy: denies  History of Radiation to the chest: none Number of pregnancies: 2 Age of first pregnancy: 1 Previous breast biopsy: left breast (benign)   PROBLEM LIST:         Problem List  Date Reviewed: 11/26/2020          Noted    History of 2019 novel coronavirus disease (COVID-19) 12/12/2019    Chronic respiratory failure with hypoxia (CMS-HCC) 10/05/2019    Rash 09/24/2019    Osteoporosis 05/04/2018    GAD (generalized anxiety disorder) 05/04/2018    Ex-smoker 05/04/2018    Anemia 12/29/2017    Low serum vitamin B12 12/29/2017    Other chronic pain 12/29/2017    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               GENERAL REVIEW OF SYSTEMS:    General ROS: negative for - chills, fatigue, fever, weight gain or weight loss Allergy and Immunology ROS: negative for - hives  Hematological and Lymphatic ROS: negative for - bleeding problems or  bruising, negative for palpable nodes Endocrine ROS: negative for - heat or cold intolerance, hair changes Respiratory ROS: Positive for - cough, shortness of breath or wheezing Cardiovascular ROS: no chest pain or palpitations GI ROS: negative for nausea, vomiting, abdominal pain, diarrhea, constipation Musculoskeletal ROS: negative for - joint swelling or muscle pain Neurological ROS: negative for - confusion, syncope Dermatological ROS: negative for pruritus and rash Psychiatric: negative for anxiety, depression, difficulty sleeping and memory loss   MEDICATIONS:       Current Outpatient Medications  Medication Sig Dispense Refill   ADVAIR HFA 115-21 mcg/actuation inhaler INHALE 1 PUFF BY MOUTH EVERY 12 HOURS AS DIRECTED RINSE MOUTH AFTER EACH USE 12 g 3   albuterol 90 mcg/actuation inhaler INHALE 2 INHALATIONS INTO THE LUNGS EVERY 6 (SIX) HOURS AS NEEDED 1 each 5   alendronate (FOSAMAX) 70 MG tablet TAKE 1 TABLET (70 MG TOTAL) BY MOUTH EVERY 7 (SEVEN) DAYS FOR 180 DAYS TAKE WITH A FULL GLASS OF WATER. DO NOT LIE DOWN FOR THE NEXT 30 MIN. 12 tablet 1   citalopram (CELEXA) 10 MG tablet TAKE 1 TABLET BY MOUTH EVERY DAY 90 tablet 1   cyanocobalamin, vitamin B-12, 1,000 mcg Cap TAKE 1 CAPSULE BY MOUTH ONCE DAILY 90 capsule 1   ELIQUIS 5 mg tablet Take 5 mg by mouth 2 (two) times daily  fluticasone-umeclidinium-vilanterol (TRELEGY ELLIPTA) 100-62.5-25 mcg inhaler Inhale 1 inhalation into the lungs once daily DO NOT USE ADVAIR OR SPIRIVA 1 each 11   LYRICA 150 mg capsule Take 1 capsule (150 mg total) by mouth 2 (two) times daily 60 capsule 3   NARCAN 4 mg/actuation For suspected opioid overdose, support neck, allow head to tilt back, use 1 spray in 1 nostril, if needed, wait 2-3 minutes, spray in other   0   ondansetron (ZOFRAN-ODT) 4 MG disintegrating tablet TAKE 1 TABLET (4 MG TOTAL) BY MOUTH 2 (TWO) TIMES DAILY AS NEEDED FOR NAUSEA 60 tablet 1   oxyCODONE-acetaminophen (PERCOCET) 7.5-325  mg tablet TAKE 1 TABLET BY MOUTH 5 TIMES A DAY AS NEEDED FOR PAIN       potassium chloride (KLOR-CON) 10 MEQ ER tablet TAKE 1 TABLET BY MOUTH ONCE DAILY 90 tablet 1   SPIRIVA WITH HANDIHALER 18 mcg inhalation capsule PLACE ONE CAPSULE INTO INHALER AND INHALE ONCE DAILY AS DIRECTED 30 capsule 5   tiZANidine (ZANAFLEX) 2 MG tablet TAKE 1 TABLET (2 MG TOTAL) BY MOUTH 3 (THREE) TIMES DAILY FOR 30 DAYS 90 tablet 1   albuterol (PROVENTIL) 2.5 mg /3 mL (0.083 %) nebulizer solution Take 3 mLs (2.5 mg total) by nebulization every 6 (six) hours as needed for Wheezing 360 mL 11   DALIRESP 500 mcg tablet TAKE 1 TABLET (0.5 MG TOTAL) BY MOUTH ONCE DAILY (Patient not taking: Reported on 01/18/2021) 90 tablet 3   IRON POLYSACCHARIDES 150 mg iron capsule TAKE ONE CAPSULE BY MOUTH DAILY (Patient not taking: Reported on 01/18/2021) 90 capsule 1   L. acidophilus-L. rhamnosus (PROBIOTIC) 15 billion cell Cap Take 1 capsule by mouth once daily (Patient not taking: Reported on 01/18/2021)       potassium chloride (MICRO-K) 10 MEQ ER capsule TAKE 1 TABLET BY MOUTH DAILY (Patient not taking: Reported on 01/18/2021) 30 capsule 11   rOPINIRole (REQUIP) 0.25 MG tablet Take 0.25 mg by mouth nightly        No current facility-administered medications for this visit.      ALLERGIES: Strawberry   PAST MEDICAL HISTORY:     Past Medical History:  Diagnosis Date   COPD (chronic obstructive pulmonary disease) (CMS-HCC)     Depression     Migraine        PAST SURGICAL HISTORY:      Past Surgical History:  Procedure Laterality Date   Right hip pinning Right 04/26/14      FAMILY HISTORY:      Family History  Problem Relation Age of Onset   High blood pressure (Hypertension) Mother     Asthma Mother     Myocardial Infarction (Heart attack) Mother     Stroke Mother     Cancer Mother     No Known Problems Father        SOCIAL HISTORY: Social History         Socioeconomic History   Marital status: Married  Tobacco  Use   Smoking status: Former Smoker      Packs/day: 1.00      Years: 40.00      Pack years: 40.00      Types: Cigarettes      Quit date: 11/16/2013      Years since quitting: 7.1   Smokeless tobacco: Never Used  Vaping Use   Vaping Use: Never used  Substance and Sexual Activity   Alcohol use: No   Drug use: No   Sexual activity:  Defer      PHYSICAL EXAM:    Vitals:    01/18/21 0938  BP: 134/77  Pulse: 95    Body mass index is 32.1 kg/m. Weight: 84.8 kg (187 lb)    GENERAL: Alert, active, oriented x3   HEENT: Pupils equal reactive to light. Extraocular movements are intact. Sclera clear. Palpebral conjunctiva normal red color.Pharynx clear.   NECK: Supple with no palpable mass and no adenopathy.   LUNGS: Sound clear with no rales rhonchi or wheezes.   HEART: Regular rhythm S1 and S2 without murmur.   BREAST: breasts appear normal, no suspicious masses, no skin or nipple changes or axillary nodes.   ABDOMEN: Soft and depressible, nontender with no palpable mass, no hepatomegaly.   EXTREMITIES: Well-developed well-nourished symmetrical with no dependent edema.   NEUROLOGICAL: Awake alert oriented, facial expression symmetrical, moving all extremities.   REVIEW OF DATA: I have reviewed the following data today:      Appointment on 12/11/2020  Component Date Value   WBC (White Blood Cell Co* 12/11/2020 7.0    RBC (Red Blood Cell Coun* 12/11/2020 3.61 (!)   Hemoglobin 12/11/2020 10.5 (!)   Hematocrit 12/11/2020 33.5 (!)   MCV (Mean Corpuscular Vo* 12/11/2020 92.8    MCH (Mean Corpuscular He* 12/11/2020 29.1    MCHC (Mean Corpuscular H* 12/11/2020 31.3 (!)   Platelet Count 12/11/2020 328    RDW-CV (Red Cell Distrib* 12/11/2020 13.8    MPV (Mean Platelet Volum* 12/11/2020 9.2 (!)   Neutrophils 12/11/2020 3.85    Lymphocytes 12/11/2020 2.24    Monocytes 12/11/2020 0.54    Eosinophils 12/11/2020 0.33    Basophils 12/11/2020 0.05    Neutrophil % 12/11/2020 54.9     Lymphocyte % 12/11/2020 31.9    Monocyte % 12/11/2020 7.7    Eosinophil % 12/11/2020 4.7    Basophil% 12/11/2020 0.7    Immature Granulocyte % 12/11/2020 0.1    Immature Granulocyte Cou* 12/11/2020 0.01    Glucose 12/11/2020 128 (!)   Sodium 12/11/2020 139    Potassium 12/11/2020 4.0    Chloride 12/11/2020 100    Carbon Dioxide (CO2) 12/11/2020 36.3 (!)   Urea Nitrogen (BUN) 12/11/2020 8    Creatinine 12/11/2020 0.9    Glomerular Filtration Ra* 12/11/2020 63    Calcium 12/11/2020 9.1    AST  12/11/2020 11    ALT  12/11/2020 9    Alk Phos (alkaline Phosp* 12/11/2020 55    Albumin 12/11/2020 3.6    Bilirubin, Total 12/11/2020 0.3    Protein, Total 12/11/2020 6.5    A/G Ratio 12/11/2020 1.2    Cholesterol, Total 12/11/2020 192    Triglyceride 12/11/2020 215 (!)   HDL (High Density Lipopr* 12/11/2020 42.7    LDL Calculated 12/11/2020 106    VLDL Cholesterol 12/11/2020 43    Cholesterol/HDL Ratio 12/11/2020 4.5    Urine Albumin, Random 12/11/2020 16    Thyroid Stimulating Horm* 12/11/2020 1.306    Hemoglobin A1C 12/11/2020 5.8 (!)   Average Blood Glucose (C* 12/11/2020 120    Vitamin B12 12/11/2020 573    Ferritin 12/11/2020 33       ASSESSMENT: Ms. Kass is a 65 y.o. female presenting for consultation for right breast cancer.     Patient was oriented again about the pathology results. Surgical alternatives were discussed with patient including partial vs total mastectomy. Surgical technique and post operative care was discussed with patient. Risk of surgery was discussed with patient including but not limited  to: wound infection, seroma, hematoma, brachial plexopathy, mondor's disease (thrombosis of small veins of breast), chronic wound pain, breast lymphedema, altered sensation to the nipple and cosmesis among others.    Due to patient markers of ER/PR positive and HER2/neu positive will wait for final medical oncology evaluation to continue discussion about neck symptom  management.  Patient oriented about the procedure of partial mastectomy and about the procedure of insertion of Port-A-Cath in case she needs a Chemo-Port.  We will continue communication with the patient regarding further management.   Malignant neoplasm of upper-inner quadrant of right breast in female, estrogen receptor positive (CMS-HCC) [C50.211, Z17.0]   PLAN: Insertion of Port a Cath   Patient verbalized understanding, all questions were answered, and were agreeable with the plan outlined above.

## 2021-02-08 ENCOUNTER — Other Ambulatory Visit
Admission: RE | Admit: 2021-02-08 | Discharge: 2021-02-08 | Disposition: A | Discharge status: Home / Self Care | Payer: Medicare Other | Source: Ambulatory Visit | Attending: General Surgery | Admitting: General Surgery

## 2021-02-08 ENCOUNTER — Ambulatory Visit: Payer: Medicare Other | Admitting: Cardiology

## 2021-02-08 ENCOUNTER — Encounter: Payer: Self-pay | Admitting: Cardiology

## 2021-02-08 ENCOUNTER — Other Ambulatory Visit: Payer: Self-pay

## 2021-02-08 VITALS — BP 120/72 | HR 89 | Ht 65.0 in | Wt 183.0 lb

## 2021-02-08 DIAGNOSIS — I480 Paroxysmal atrial fibrillation: Secondary | ICD-10-CM

## 2021-02-08 DIAGNOSIS — E7800 Pure hypercholesterolemia, unspecified: Secondary | ICD-10-CM

## 2021-02-08 DIAGNOSIS — Z01810 Encounter for preprocedural cardiovascular examination: Secondary | ICD-10-CM

## 2021-02-08 HISTORY — DX: Essential (primary) hypertension: I10

## 2021-02-08 HISTORY — DX: Cardiac arrhythmia, unspecified: I49.9

## 2021-02-08 HISTORY — DX: Unspecified osteoarthritis, unspecified site: M19.90
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
## 2021-02-08 NOTE — Patient Instructions (Signed)

## 2021-02-08 NOTE — Patient Instructions (Signed)
Your procedure is scheduled on: Friday February 15, 2021. Report to Day Surgery inside [facility] 2nd floor (stop by admissions desk first before getting on elevator). To find out your arrival time please call [phone] between 1PM - 3PM on Thursday June 30 , 2022.  Remember: Instructions that are not followed completely may result in serious medical risk,  up to and including death, or upon the discretion of your surgeon and anesthesiologist your  surgery may need to be rescheduled.     _X__ 1. Do not eat food or drink fluids after midnight the night before your procedure.                 No chewing gum or hard candies.   __X__2.  On the morning of surgery brush your teeth with toothpaste and water, you                may rinse your mouth with mouthwash if you wish.  Do not swallow any toothpaste of mouthwash.     _X__ 3.  No Alcohol for 24 hours before or after surgery.   _X__ 4.  Do Not Smoke or use e-cigarettes For 24 Hours Prior to Your Surgery.                 Do not use any chewable tobacco products for at least 6 hours prior to                 Surgery.  _X__  5.  Do not use any recreational drugs (marijuana, cocaine, heroin, ecstasy, MDMA or other)                For at least one week prior to your surgery.  Combination of these drugs with anesthesia                May have life threatening results.  __X__ 6.  Notify your doctor if there is any change in your medical condition      (cold, fever, infections).     Do not wear jewelry, make-up, hairpins, clips or nail polish. Do not wear lotions, powders, or perfumes.  Do not shave 48 hours prior to surgery.  Do not bring valuables to the hospital.    [facility] is not responsible for any belongings or valuables.  Contacts, dentures or bridgework may not be worn into surgery. Leave your suitcase in the car. After surgery it may be brought to your room. For patients admitted to the hospital, discharge  time is determined by your treatment team.   Patients discharged the day of surgery will not be allowed to drive home.   Make arrangements for someone to be with you for the first 24 hours of your Same Day Discharge.   _X___ Take these medicines the morning of surgery with A SIP OF WATER:    1. None   2.   3.   4.  5.  6.  ____ Fleet Enema (as directed)   __X__ Use CHG Soap (or wipes) as directed  ____ Use Benzoyl Peroxide Gel as instructed  __X__ Use inhalers on the day of surgery  TRELEGY ELLIPTA 100-62.5-25 MCG/INH AEPB  albuterol (VENTOLIN HFA) 108 (90 Base) MCG/ACT inhaler  ____ Stop metformin 2 days prior to surgery    ____ Take 1/2 of usual insulin dose the night before surgery. No insulin the morning          of surgery.   __X__ Patient will  ask Cardiologist about apixaban (ELIQUIS) 5 MG TABS today at appointment before your surgery.   __X__ One Week prior to surgery- Stop Anti-inflammatories such as Ibuprofen, Aleve, Advil, Motrin, meloxicam (MOBIC), diclofenac, etodolac, ketorolac, Toradol, Daypro, piroxicam, Goody's or BC powders. OK TO USE TYLENOL IF NEEDED   __X__ Stop supplements until after surgery.    ____ Bring C-Pap to the hospital.    If you have any questions regarding your pre-procedure instructions,  Please call Pre-admit Testing at [phone].

## 2021-02-14 ENCOUNTER — Inpatient Hospital Stay: Payer: Medicare Other

## 2021-02-14 ENCOUNTER — Other Ambulatory Visit: Payer: Self-pay

## 2021-02-15 ENCOUNTER — Encounter: Payer: Self-pay | Admitting: General Surgery

## 2021-02-15 ENCOUNTER — Other Ambulatory Visit: Payer: Self-pay

## 2021-02-15 ENCOUNTER — Encounter
Admission: RE | Disposition: A | Discharge status: Home / Self Care | Payer: Self-pay | Source: Home / Self Care | Attending: General Surgery

## 2021-02-15 ENCOUNTER — Ambulatory Visit: Payer: Medicare Other | Admitting: Anesthesiology

## 2021-02-15 ENCOUNTER — Ambulatory Visit: Payer: Medicare Other

## 2021-02-15 ENCOUNTER — Ambulatory Visit
Admission: RE | Admit: 2021-02-15 | Discharge: 2021-02-15 | Disposition: A | Discharge status: Home / Self Care | Payer: Medicare Other | Attending: General Surgery | Admitting: General Surgery

## 2021-02-15 DIAGNOSIS — Z825 Family history of asthma and other chronic lower respiratory diseases: Secondary | ICD-10-CM | POA: Diagnosis not present

## 2021-02-15 DIAGNOSIS — Z87891 Personal history of nicotine dependence: Secondary | ICD-10-CM | POA: Insufficient documentation

## 2021-02-15 DIAGNOSIS — Z803 Family history of malignant neoplasm of breast: Secondary | ICD-10-CM | POA: Insufficient documentation

## 2021-02-15 DIAGNOSIS — Z801 Family history of malignant neoplasm of trachea, bronchus and lung: Secondary | ICD-10-CM | POA: Insufficient documentation

## 2021-02-15 DIAGNOSIS — Z8616 Personal history of COVID-19: Secondary | ICD-10-CM | POA: Insufficient documentation

## 2021-02-15 DIAGNOSIS — C50211 Malignant neoplasm of upper-inner quadrant of right female breast: Secondary | ICD-10-CM | POA: Diagnosis present

## 2021-02-15 DIAGNOSIS — Z170 Estrogen receptor positive status [ER+]: Secondary | ICD-10-CM | POA: Diagnosis not present

## 2021-02-15 DIAGNOSIS — Z9981 Dependence on supplemental oxygen: Secondary | ICD-10-CM | POA: Insufficient documentation

## 2021-02-15 DIAGNOSIS — Z7901 Long term (current) use of anticoagulants: Secondary | ICD-10-CM | POA: Insufficient documentation

## 2021-02-15 DIAGNOSIS — Z7951 Long term (current) use of inhaled steroids: Secondary | ICD-10-CM | POA: Insufficient documentation

## 2021-02-15 DIAGNOSIS — Z79899 Other long term (current) drug therapy: Secondary | ICD-10-CM | POA: Diagnosis not present

## 2021-02-15 DIAGNOSIS — Z8249 Family history of ischemic heart disease and other diseases of the circulatory system: Secondary | ICD-10-CM | POA: Diagnosis not present

## 2021-02-15 DIAGNOSIS — J439 Emphysema, unspecified: Secondary | ICD-10-CM | POA: Diagnosis not present

## 2021-02-15 DIAGNOSIS — Z95828 Presence of other vascular implants and grafts: Secondary | ICD-10-CM

## 2021-02-15 DIAGNOSIS — Z823 Family history of stroke: Secondary | ICD-10-CM | POA: Insufficient documentation

## 2021-02-15 HISTORY — PX: PORTACATH PLACEMENT: SHX2246

## 2021-02-15 SURGERY — INSERTION, TUNNELED CENTRAL VENOUS DEVICE, WITH PORT
Anesthesia: General | Site: Chest

## 2021-02-15 MED ORDER — FENTANYL CITRATE (PF) 100 MCG/2ML IJ SOLN
INTRAMUSCULAR | Status: DC | PRN
  Administered 2021-02-15: 50 ug via INTRAVENOUS
  Administered 2021-02-15 (×2): 25 ug via INTRAVENOUS

## 2021-02-15 MED ORDER — MIDAZOLAM HCL 2 MG/2ML IJ SOLN
INTRAMUSCULAR | Status: DC | PRN
  Administered 2021-02-15: 2 mg via INTRAVENOUS

## 2021-02-15 MED ORDER — FAMOTIDINE 20 MG PO TABS
ORAL_TABLET | ORAL | Status: AC
  Administered 2021-02-15: 20 mg via ORAL
  Filled 2021-02-15: qty 1

## 2021-02-15 MED ORDER — FENTANYL CITRATE (PF) 100 MCG/2ML IJ SOLN
INTRAMUSCULAR | Status: AC
  Filled 2021-02-15: qty 2

## 2021-02-15 MED ORDER — CHLORHEXIDINE GLUCONATE 0.12 % MT SOLN
OROMUCOSAL | Status: AC
  Administered 2021-02-15: 15 mL via OROMUCOSAL
  Filled 2021-02-15: qty 15

## 2021-02-15 MED ORDER — LACTATED RINGERS IV SOLN: INTRAVENOUS | Status: DC

## 2021-02-15 MED ORDER — HYDROCODONE-ACETAMINOPHEN 5-325 MG PO TABS: 1.0000 | ORAL_TABLET | ORAL | 0 refills | Status: AC | PRN

## 2021-02-15 MED ORDER — BUPIVACAINE-EPINEPHRINE (PF) 0.25% -1:200000 IJ SOLN
INTRAMUSCULAR | Status: AC
  Filled 2021-02-15: qty 30

## 2021-02-15 MED ORDER — SODIUM CHLORIDE (PF) 0.9 % IJ SOLN
INTRAMUSCULAR | Status: AC
  Filled 2021-02-15: qty 50

## 2021-02-15 MED ORDER — SODIUM CHLORIDE 0.9 % IV SOLN
INTRAVENOUS | Status: DC | PRN
  Administered 2021-02-15: 10 mL via INTRAMUSCULAR

## 2021-02-15 MED ORDER — ONDANSETRON HCL 4 MG/2ML IJ SOLN: 4.0000 mg | Freq: Once | INTRAMUSCULAR | Status: DC | PRN

## 2021-02-15 MED ORDER — PROPOFOL 10 MG/ML IV BOLUS
INTRAVENOUS | Status: AC
  Filled 2021-02-15: qty 20

## 2021-02-15 MED ORDER — CEFAZOLIN SODIUM-DEXTROSE 2-4 GM/100ML-% IV SOLN
2.0000 g | INTRAVENOUS | Status: AC
  Administered 2021-02-15: 2 g via INTRAVENOUS

## 2021-02-15 MED ORDER — FENTANYL CITRATE (PF) 100 MCG/2ML IJ SOLN
25.0000 ug | INTRAMUSCULAR | Status: DC | PRN
  Administered 2021-02-15 (×2): 25 ug via INTRAVENOUS

## 2021-02-15 MED ORDER — ORAL CARE MOUTH RINSE: 15.0000 mL | Freq: Once | OROMUCOSAL | Status: AC

## 2021-02-15 MED ORDER — PROPOFOL 10 MG/ML IV BOLUS
INTRAVENOUS | Status: DC | PRN
  Administered 2021-02-15 (×2): 50 mg via INTRAVENOUS

## 2021-02-15 MED ORDER — HEPARIN SODIUM (PORCINE) 5000 UNIT/ML IJ SOLN
INTRAMUSCULAR | Status: AC
  Filled 2021-02-15: qty 1

## 2021-02-15 MED ORDER — MIDAZOLAM HCL 2 MG/2ML IJ SOLN
INTRAMUSCULAR | Status: AC
  Filled 2021-02-15: qty 2

## 2021-02-15 MED ORDER — FENTANYL CITRATE (PF) 100 MCG/2ML IJ SOLN
INTRAMUSCULAR | Status: AC
  Administered 2021-02-15: 25 ug via INTRAVENOUS
  Filled 2021-02-15: qty 2

## 2021-02-15 MED ORDER — CEFAZOLIN SODIUM-DEXTROSE 2-4 GM/100ML-% IV SOLN
INTRAVENOUS | Status: AC
  Filled 2021-02-15: qty 100

## 2021-02-15 MED ORDER — PROPOFOL 500 MG/50ML IV EMUL
INTRAVENOUS | Status: DC | PRN
  Administered 2021-02-15 (×2): 100 ug/kg/min via INTRAVENOUS

## 2021-02-15 MED ORDER — BUPIVACAINE-EPINEPHRINE (PF) 0.25% -1:200000 IJ SOLN
INTRAMUSCULAR | Status: DC | PRN
  Administered 2021-02-15: 15 mL

## 2021-02-15 MED ORDER — LACTATED RINGERS IV SOLN: INTRAVENOUS | Status: DC | PRN

## 2021-02-15 MED ORDER — CHLORHEXIDINE GLUCONATE 0.12 % MT SOLN: 15.0000 mL | Freq: Once | OROMUCOSAL | Status: AC

## 2021-02-15 MED ORDER — FAMOTIDINE 20 MG PO TABS: 20.0000 mg | ORAL_TABLET | Freq: Once | ORAL | Status: AC

## 2021-02-15 SURGICAL SUPPLY — 36 items
ADH SKN CLS APL DERMABOND .7 (GAUZE/BANDAGES/DRESSINGS) ×1
APL PRP STRL LF DISP 70% ISPRP (MISCELLANEOUS) ×1
BAG DECANTER FOR FLEXI CONT (MISCELLANEOUS) ×2 IMPLANT
BLADE SURG 11 STRL SS SAFETY (MISCELLANEOUS) ×2 IMPLANT
BLADE SURG SZ11 CARB STEEL (BLADE) ×2 IMPLANT
BOOT SUTURE AID YELLOW STND (SUTURE) ×2 IMPLANT
CANISTER SUCT 1200ML W/VALVE (MISCELLANEOUS) IMPLANT
CHLORAPREP W/TINT 26 (MISCELLANEOUS) ×2 IMPLANT
COVER LIGHT HANDLE STERIS (MISCELLANEOUS) ×4 IMPLANT
DERMABOND ADVANCED (GAUZE/BANDAGES/DRESSINGS) ×1
DERMABOND ADVANCED .7 DNX12 (GAUZE/BANDAGES/DRESSINGS) ×1 IMPLANT
DRAPE C-ARM XRAY 36X54 (DRAPES) ×2 IMPLANT
ELECT REM PT RETURN 9FT ADLT (ELECTROSURGICAL) ×2
ELECTRODE REM PT RTRN 9FT ADLT (ELECTROSURGICAL) ×1 IMPLANT
GAUZE 4X4 16PLY ~~LOC~~+RFID DBL (SPONGE) ×2 IMPLANT
GLOVE SURG ENC MOIS LTX SZ6.5 (GLOVE) ×10 IMPLANT
GLOVE SURG UNDER POLY LF SZ6.5 (GLOVE) ×10 IMPLANT
GOWN STRL REUS W/ TWL LRG LVL3 (GOWN DISPOSABLE) ×5 IMPLANT
GOWN STRL REUS W/TWL LRG LVL3 (GOWN DISPOSABLE) ×10
IV NS 500ML (IV SOLUTION) ×2
IV NS 500ML BAXH (IV SOLUTION) ×1 IMPLANT
KIT PORT POWER 8FR ISP CVUE (Port) ×2 IMPLANT
KIT TURNOVER KIT A (KITS) ×2 IMPLANT
LABEL OR SOLS (LABEL) ×2 IMPLANT
MANIFOLD NEPTUNE II (INSTRUMENTS) ×2 IMPLANT
NEEDLE FILTER BLUNT 18X 1/2SAF (NEEDLE) ×1
NEEDLE FILTER BLUNT 18X1 1/2 (NEEDLE) ×1 IMPLANT
PACK PORT-A-CATH (MISCELLANEOUS) ×2 IMPLANT
SUT MNCRL AB 4-0 PS2 18 (SUTURE) ×2 IMPLANT
SUT PROLENE 2 0 FS (SUTURE) ×2 IMPLANT
SUT VIC AB 2-0 SH 27 (SUTURE) ×2
SUT VIC AB 2-0 SH 27XBRD (SUTURE) ×1 IMPLANT
SUT VIC AB 3-0 SH 27 (SUTURE) ×2
SUT VIC AB 3-0 SH 27X BRD (SUTURE) ×1 IMPLANT
SYR 10ML LL (SYRINGE) ×2 IMPLANT
SYR 3ML LL SCALE MARK (SYRINGE) ×2 IMPLANT

## 2021-02-15 NOTE — Interval H&P Note (Signed)
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  The various methods of treatment have been discussed with the patient and family. After consideration of risks, benefits and other options for treatment, the patient has consented to  Procedure(s): INSERTION PORT-A-CATH (N/A) as a surgical intervention.  The patient's history has been reviewed, patient examined, no change in status, stable for surgery.  I have reviewed the patient's chart and labs.  Questions were answered to the patient's satisfaction.     Herbert Pun

## 2021-02-15 NOTE — Anesthesia Postprocedure Evaluation (Signed)
Anesthesia Post Note  Patient: Debra [name]  Procedure(s) Performed: INSERTION PORT-A-CATH (Chest)  Patient location during evaluation: PACU Anesthesia Type: General Level of consciousness: awake and alert and oriented Pain management: pain level controlled Vital Signs Assessment: post-procedure vital signs reviewed and stable Respiratory status: spontaneous breathing, nonlabored ventilation and respiratory function stable Cardiovascular status: blood pressure returned to baseline and stable Postop Assessment: no signs of nausea or vomiting Anesthetic complications: no   No notable events documented.   Last Vitals:  Vitals:   02/15/21 1238 02/15/21 1257  BP: 100/73 (!) 153/68  Pulse: 81 84  Resp: 18 16  Temp: 36.6 C 36.6 C  SpO2: 99% 100%    Last Pain:  Vitals:   02/15/21 1257  TempSrc: Temporal  PainSc: 2                  Albina Gosney

## 2021-02-15 NOTE — Transfer of Care (Signed)
Immediate Anesthesia Transfer of Care Note  Patient: Debra [name]  Procedure(s) Performed: INSERTION PORT-A-CATH (Chest)  Patient Location: PACU  Anesthesia Type:General  Level of Consciousness: awake, alert  and oriented  Airway & Oxygen Therapy: Patient Spontanous Breathing and Patient connected to face mask oxygen  Post-op Assessment: Report given to RN and Post -op Vital signs reviewed and stable  Post vital signs: Reviewed and stable  Last Vitals:  Vitals Value Taken Time  BP 122/72 02/15/21 1205  Temp 36.5 C 02/15/21 1206  Pulse 86 02/15/21 1210  Resp 19 02/15/21 1210  SpO2 99 % 02/15/21 1210  Vitals shown include unvalidated device data.  Last Pain:  Vitals:   02/15/21 0855  TempSrc: Temporal  PainSc: 0-No pain         Complications: No notable events documented.

## 2021-02-15 NOTE — Discharge Instructions (Addendum)
  Diet: Resume home heart healthy regular diet.   Activity: Increase activity as tolerated. Light activity and walking are encouraged. Do not drive or drink alcohol if taking narcotic pain medications.  Wound care: May shower with soapy water and pat dry (do not rub incisions), but no baths or submerging incision underwater until follow-up. (no swimming)   Medications: Resume all home medications. For mild to moderate pain: acetaminophen (Tylenol) or ibuprofen (if no kidney disease). Combining Tylenol with alcohol can substantially increase your risk of causing liver disease. Narcotic pain medications, if prescribed, can be used for severe pain, though may cause nausea, constipation, and drowsiness. Do not combine Tylenol and Norco within a 6 hour period as Norco contains Tylenol. If you do not need the narcotic pain medication, you do not need to fill the prescription.  Call office ([phone]) at any time if any questions, worsening pain, fevers/chills, bleeding, drainage from incision site, or other concerns.   AMBULATORY SURGERY  DISCHARGE INSTRUCTIONS   The drugs that you were given will stay in your system until tomorrow so for the next 24 hours you should not:  Drive an automobile Make any legal decisions Drink any alcoholic beverage   You may resume regular meals tomorrow.  Today it is better to start with liquids and gradually work up to solid foods.  You may eat anything you prefer, but it is better to start with liquids, then soup and crackers, and gradually work up to solid foods.   Please notify your doctor immediately if you have any unusual bleeding, trouble breathing, redness and pain at the surgery site, drainage, fever, or pain not relieved by medication.    Your post-operative visit with Dr.                                       is: Date:                        Time:    Please call to schedule your post-operative visit.  Additional Instructions: 

## 2021-02-15 NOTE — Op Note (Signed)
SURGICAL PROCEDURE REPORT  DATE OF PROCEDURE: 02/15/2021   SURGEON: Dr. Windell Moment   ANESTHESIA: Local with light IV sedation   PRE-OPERATIVE DIAGNOSIS: Advance breast cancer requiring durable central venous access for chemotherapy   POST-OPERATIVE DIAGNOSIS: Advance breast cancer requiring durable central venous access for chemotherapy   PROCEDURE(S):  1.) Percutaneous access of Left internal jugular vein under ultrasound guidance 2.) Insertion of tunneled Left internal jugular central venous catheter with subcutaneous port  INTRAOPERATIVE FINDINGS: Patent easily compressible Left internal jugular vein with appropriate respiratory variations and well-secured tunneled central venous catheter with subcutaneous port at completion of the procedure  ESTIMATED BLOOD LOSS: Minimal (<20 mL)   SPECIMENS: None   IMPLANTS: 29F tunneled Bard PowerPort central venous catheter with subcutaneous port  DRAINS: None   COMPLICATIONS: None apparent   CONDITION AT COMPLETION: Hemodynamically stable, awake   DISPOSITION: PACU   INDICATION(S) FOR PROCEDURE:  Patient is a 65 y.o. female who presented with advanced breast cancer requiring durable central venous access for chemotherapy. All risks, benefits, and alternatives to above elective procedures were discussed with the patient, who elected to proceed, and informed consent was accordingly obtained at that time.  DETAILS OF PROCEDURE:  Patient was brought to the operative suite and appropriately identified. In Trendelenburg position, Left IJ venous access site was prepped and draped in the usual sterile fashion, and following a brief timeout, percutaneous Left IJ venous access was obtained under ultrasound guidance using Seldinger technique, by which local anesthetic was injected over the Left IJ vein, and access needle was inserted under direct ultrasound visualization into the Left IJ vein, through which soft guidewire was advanced, over which  access needle was withdrawn. Guidewire was secured, attention was directed to injection of local anesthetic along the planned tunnel site, 2-3 cm transverse Left chest incision was made and confirmed to accommodate the subcutaneous port, and flushed catheter was tunneled retrograde from the port site over the Left chest to the Left IJ access site with the attached port well-secured to the catheter and within the subcutaneous pocket. Insertion sheath was advanced over the guidewire, which was withdrawn along with the insertion sheath dilator. The catheter was introduced through the sheath and left on the [place] junction under fluoro guidance and catheter cut to desire lenght. Catheter connected to port and fixed to the pocket on two side to avoid twisting. Port was confirmed to withdraw blood and flush easily, after which concentrated heparin was instilled into the port and catheter. Dermis at the subcutaneous pocket was re-approximated using buried interrupted 3-0 Vicryl suture, and 4-0 Monocryl suture was used to re-approximate skin at the insertion and subcutaneous port sites in running subcuticular fashion for the subcutaneous port and buried interrupted fashion for the insertion site. Skin was cleaned, dried, and sterile skin glue was applied. Patient was then safely transferred to PACU for a chest x-ray. Ultrasound images are available on paper chart and Fluoroscopy guidance images are available in Epic.

## 2021-02-15 NOTE — Anesthesia Preprocedure Evaluation (Signed)
Anesthesia Evaluation  Patient identified by MRN, date of birth, ID band Patient awake    Reviewed: Allergy & Precautions, NPO status , Patient's Chart, lab work & pertinent test results  History of Anesthesia Complications Negative for: history of anesthetic complications  Airway Mallampati: II  TM Distance: >3 FB Neck ROM: Full    Dental  (+) Upper Dentures   Pulmonary neg sleep apnea, COPD,  oxygen dependent, former smoker,    breath sounds clear to auscultation- rhonchi (-) wheezing      Cardiovascular hypertension, Pt. on medications (-) CAD, (-) Past MI, (-) Cardiac Stents and (-) CABG  Rhythm:Regular Rate:Normal - Systolic murmurs and - Diastolic murmurs    Neuro/Psych neg Seizures PSYCHIATRIC DISORDERS Anxiety Depression negative neurological ROS     GI/Hepatic negative GI ROS, Neg liver ROS,   Endo/Other  negative endocrine ROSneg diabetes  Renal/GU negative Renal ROS     Musculoskeletal  (+) Arthritis ,   Abdominal (+) + obese,   Peds  Hematology negative hematology ROS (+)   Anesthesia Other Findings Past Medical History: No date: Anxiety No date: Arthritis No date: Carotid bruit     Comment:  L --nl doppler 5/09- and again 5/13 with 0-39% stenosis               bilat No date: Constipation No date: COPD (chronic obstructive pulmonary disease) (HCC) No date: Dysrhythmia No date: Fatigue 2015: Fracture of femoral neck, right (HCC) No date: GI (gastrointestinal bleed)     Comment:  [name] No date: Hepatitis No date: Hyperlipidemia No date: Hypertension No date: Left arm pain No date: Leg pain     Comment:  Chronic pain R leg from injury No date: Osteopenia 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 No date: Tobacco abuse   Reproductive/Obstetrics                             Anesthesia Physical Anesthesia Plan  ASA: 3  Anesthesia Plan: General   Post-op Pain  Management:    Induction: Intravenous  PONV Risk Score and Plan: 2 and Propofol infusion  Airway Management Planned: Natural Airway  Additional Equipment:   Intra-op Plan:   Post-operative Plan:   Informed Consent: I have reviewed the patients History and Physical, chart, labs and discussed the procedure including the risks, benefits and alternatives for the proposed anesthesia with the patient or authorized representative who has indicated his/her understanding and acceptance.     Dental advisory given  Plan Discussed with: CRNA and Anesthesiologist  Anesthesia Plan Comments:         Anesthesia Quick Evaluation

## 2021-02-17 ENCOUNTER — Encounter: Payer: Self-pay | Admitting: General Surgery

## 2021-02-19 ENCOUNTER — Inpatient Hospital Stay: Payer: Medicare Other

## 2021-02-19 ENCOUNTER — Other Ambulatory Visit: Payer: Self-pay

## 2021-02-19 ENCOUNTER — Encounter: Payer: Self-pay | Admitting: Licensed Clinical Social Worker

## 2021-02-19 ENCOUNTER — Inpatient Hospital Stay: Payer: Medicare Other | Attending: Oncology | Admitting: Licensed Clinical Social Worker

## 2021-02-19 DIAGNOSIS — Z8051 Family history of malignant neoplasm of kidney: Secondary | ICD-10-CM | POA: Diagnosis not present

## 2021-02-19 DIAGNOSIS — Z5189 Encounter for other specified aftercare: Secondary | ICD-10-CM | POA: Insufficient documentation

## 2021-02-19 DIAGNOSIS — C50211 Malignant neoplasm of upper-inner quadrant of right female breast: Secondary | ICD-10-CM | POA: Diagnosis present

## 2021-02-19 DIAGNOSIS — Z5111 Encounter for antineoplastic chemotherapy: Secondary | ICD-10-CM | POA: Diagnosis present

## 2021-02-19 DIAGNOSIS — C50919 Malignant neoplasm of unspecified site of unspecified female breast: Secondary | ICD-10-CM

## 2021-02-19 DIAGNOSIS — Z79899 Other long term (current) drug therapy: Secondary | ICD-10-CM | POA: Insufficient documentation

## 2021-02-19 DIAGNOSIS — Z5112 Encounter for antineoplastic immunotherapy: Secondary | ICD-10-CM | POA: Insufficient documentation

## 2021-02-19 DIAGNOSIS — Z803 Family history of malignant neoplasm of breast: Secondary | ICD-10-CM

## 2021-02-19 DIAGNOSIS — R112 Nausea with vomiting, unspecified: Secondary | ICD-10-CM | POA: Diagnosis not present

## 2021-02-19 DIAGNOSIS — Z801 Family history of malignant neoplasm of trachea, bronchus and lung: Secondary | ICD-10-CM | POA: Insufficient documentation

## 2021-02-19 DIAGNOSIS — Z808 Family history of malignant neoplasm of other organs or systems: Secondary | ICD-10-CM | POA: Insufficient documentation

## 2021-02-19 LAB — CBC WITH DIFFERENTIAL/PLATELET
Abs Immature Granulocytes: 0 10*3/uL (ref 0.00–0.07)
Basophils Absolute: 0 10*3/uL (ref 0.0–0.1)
Basophils Relative: 1 %
Eosinophils Absolute: 0.3 10*3/uL (ref 0.0–0.5)
Eosinophils Relative: 5 %
HCT: 31.4 % — ABNORMAL LOW (ref 36.0–46.0)
Hemoglobin: 10 g/dL — ABNORMAL LOW (ref 12.0–15.0)
Immature Granulocytes: 0 %
Lymphocytes Relative: 27 %
Lymphs Abs: 1.6 10*3/uL (ref 0.7–4.0)
MCH: 29.7 pg (ref 26.0–34.0)
MCHC: 31.8 g/dL (ref 30.0–36.0)
MCV: 93.2 fL (ref 80.0–100.0)
Monocytes Absolute: 0.4 10*3/uL (ref 0.1–1.0)
Monocytes Relative: 7 %
Neutro Abs: 3.5 10*3/uL (ref 1.7–7.7)
Neutrophils Relative %: 60 %
Platelets: 287 10*3/uL (ref 150–400)
RBC: 3.37 MIL/uL — ABNORMAL LOW (ref 3.87–5.11)
RDW: 15.1 % (ref 11.5–15.5)
WBC: 5.8 10*3/uL (ref 4.0–10.5)
nRBC: 0 % (ref 0.0–0.2)

## 2021-02-19 LAB — COMPREHENSIVE METABOLIC PANEL
ALT: 12 U/L (ref 0–44)
AST: 16 U/L (ref 15–41)
Albumin: 3.6 g/dL (ref 3.5–5.0)
Alkaline Phosphatase: 48 U/L (ref 38–126)
Anion gap: 6 (ref 5–15)
BUN: 9 mg/dL (ref 8–23)
CO2: 35 mmol/L — ABNORMAL HIGH (ref 22–32)
Calcium: 8.8 mg/dL — ABNORMAL LOW (ref 8.9–10.3)
Chloride: 96 mmol/L — ABNORMAL LOW (ref 98–111)
Creatinine, Ser: 0.9 mg/dL (ref 0.44–1.00)
GFR, Estimated: >60 mL/min (ref >60)
Glucose, Bld: 137 mg/dL — ABNORMAL HIGH (ref 70–99)
Potassium: 3.6 mmol/L (ref 3.5–5.1)
Sodium: 137 mmol/L (ref 135–145)
Total Bilirubin: 0.6 mg/dL (ref 0.3–1.2)
Total Protein: 7.2 g/dL (ref 6.5–8.1)
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
## 2021-02-21 ENCOUNTER — Ambulatory Visit: Payer: Medicare Other

## 2021-02-22 ENCOUNTER — Other Ambulatory Visit: Payer: Self-pay | Admitting: Oncology

## 2021-02-22 ENCOUNTER — Telehealth: Payer: Self-pay | Admitting: Oncology

## 2021-02-22 NOTE — Telephone Encounter (Signed)
02/22/2021 Spoke w/ pt and informed her that appts for lab/MD/ *NEW* TCHP have been made for 02/25/21 @ 8:30, per RN. Reminded pt that first infusion of TCHP is scheduled for 8 hours, so to plan accordingly, and gave her call back number in case she has any questions. PT confirmed appt  SRW

## 2021-02-25 ENCOUNTER — Ambulatory Visit: Payer: Medicare Other | Admitting: Oncology

## 2021-02-25 ENCOUNTER — Telehealth: Payer: Self-pay | Admitting: Oncology

## 2021-02-25 ENCOUNTER — Other Ambulatory Visit: Payer: Medicare Other

## 2021-02-25 ENCOUNTER — Telehealth: Payer: Self-pay

## 2021-02-25 ENCOUNTER — Ambulatory Visit: Payer: Medicare Other

## 2021-02-25 NOTE — Telephone Encounter (Signed)
Called pt and reviewed new appts on 7/18. Reviewed Dexamethasone instructions with pt. Pt will bring meds on Monday.

## 2021-02-25 NOTE — Telephone Encounter (Signed)
Patient came in -- appt was changed due to miscommunication of appts

## 2021-02-27 ENCOUNTER — Ambulatory Visit: Payer: Medicare Other | Admitting: Oncology

## 2021-02-27 ENCOUNTER — Ambulatory Visit: Payer: Medicare Other

## 2021-02-27 ENCOUNTER — Other Ambulatory Visit: Payer: Medicare Other

## 2021-03-04 ENCOUNTER — Inpatient Hospital Stay: Payer: Medicare Other

## 2021-03-04 ENCOUNTER — Inpatient Hospital Stay (HOSPITAL_BASED_OUTPATIENT_CLINIC_OR_DEPARTMENT_OTHER): Payer: Medicare Other | Admitting: Oncology

## 2021-03-04 ENCOUNTER — Ambulatory Visit: Payer: Medicare Other | Admitting: Cardiology

## 2021-03-04 ENCOUNTER — Encounter: Payer: Self-pay | Admitting: Oncology

## 2021-03-04 VITALS — BP 104/64 | HR 80 | Temp 96.2°F | Resp 16 | Wt 184.8 lb

## 2021-03-04 VITALS — BP 134/75 | HR 81

## 2021-03-04 DIAGNOSIS — D649 Anemia, unspecified: Secondary | ICD-10-CM | POA: Diagnosis not present

## 2021-03-04 DIAGNOSIS — C50919 Malignant neoplasm of unspecified site of unspecified female breast: Secondary | ICD-10-CM

## 2021-03-04 DIAGNOSIS — Z5111 Encounter for antineoplastic chemotherapy: Secondary | ICD-10-CM

## 2021-03-04 DIAGNOSIS — Z803 Family history of malignant neoplasm of breast: Secondary | ICD-10-CM

## 2021-03-04 LAB — COMPREHENSIVE METABOLIC PANEL
ALT: 13 U/L (ref 0–44)
AST: 22 U/L (ref 15–41)
Albumin: 3.9 g/dL (ref 3.5–5.0)
Alkaline Phosphatase: 52 U/L (ref 38–126)
Anion gap: 9 (ref 5–15)
BUN: 14 mg/dL (ref 8–23)
CO2: 29 mmol/L (ref 22–32)
Calcium: 9.4 mg/dL (ref 8.9–10.3)
Chloride: 98 mmol/L (ref 98–111)
Creatinine, Ser: 0.69 mg/dL (ref 0.44–1.00)
GFR, Estimated: >60 mL/min (ref >60)
Glucose, Bld: 99 mg/dL (ref 70–99)
Potassium: 4.1 mmol/L (ref 3.5–5.1)
Sodium: 136 mmol/L (ref 135–145)
Total Bilirubin: 0.4 mg/dL (ref 0.3–1.2)
Total Protein: 8 g/dL (ref 6.5–8.1)

## 2021-03-04 LAB — CBC WITH DIFFERENTIAL/PLATELET
Abs Immature Granulocytes: 0.09 10*3/uL — ABNORMAL HIGH (ref 0.00–0.07)
Basophils Absolute: 0 10*3/uL (ref 0.0–0.1)
Basophils Relative: 0 %
Eosinophils Absolute: 0 10*3/uL (ref 0.0–0.5)
Eosinophils Relative: 0 %
HCT: 33.3 % — ABNORMAL LOW (ref 36.0–46.0)
Hemoglobin: 10.7 g/dL — ABNORMAL LOW (ref 12.0–15.0)
Immature Granulocytes: 1 %
Lymphocytes Relative: 10 %
Lymphs Abs: 1.4 10*3/uL (ref 0.7–4.0)
MCH: 29.8 pg (ref 26.0–34.0)
MCHC: 32.1 g/dL (ref 30.0–36.0)
MCV: 92.8 fL (ref 80.0–100.0)
Monocytes Absolute: 0.7 10*3/uL (ref 0.1–1.0)
Monocytes Relative: 5 %
Neutro Abs: 11.2 10*3/uL — ABNORMAL HIGH (ref 1.7–7.7)
Neutrophils Relative %: 84 %
Platelets: 357 10*3/uL (ref 150–400)
RBC: 3.59 MIL/uL — ABNORMAL LOW (ref 3.87–5.11)
RDW: 14.4 % (ref 11.5–15.5)
WBC: 13.3 10*3/uL — ABNORMAL HIGH (ref 4.0–10.5)
nRBC: 0 % (ref 0.0–0.2)

## 2021-03-04 MED ORDER — SODIUM CHLORIDE 0.9 % IV SOLN
150.0000 mg | Freq: Once | INTRAVENOUS | Status: AC
  Administered 2021-03-04: 150 mg via INTRAVENOUS
  Filled 2021-03-04: qty 150

## 2021-03-04 MED ORDER — HEPARIN SOD (PORK) LOCK FLUSH 100 UNIT/ML IV SOLN
500.0000 [IU] | Freq: Once | INTRAVENOUS | Status: DC
  Filled 2021-03-04: qty 5

## 2021-03-04 MED ORDER — SODIUM CHLORIDE 0.9% FLUSH
10.0000 mL | INTRAVENOUS | Status: DC | PRN
  Filled 2021-03-04: qty 10

## 2021-03-04 MED ORDER — ACETAMINOPHEN 325 MG PO TABS
ORAL_TABLET | ORAL | Status: AC
  Filled 2021-03-04: qty 2

## 2021-03-04 MED ORDER — DIPHENHYDRAMINE HCL 25 MG PO CAPS
ORAL_CAPSULE | ORAL | Status: AC
  Filled 2021-03-04: qty 1

## 2021-03-04 MED ORDER — SODIUM CHLORIDE 0.9 % IV SOLN
495.5000 mg | Freq: Once | INTRAVENOUS | Status: AC
  Administered 2021-03-04: 500 mg via INTRAVENOUS
  Filled 2021-03-04: qty 50

## 2021-03-04 MED ORDER — DIPHENHYDRAMINE HCL 25 MG PO CAPS
50.0000 mg | ORAL_CAPSULE | Freq: Once | ORAL | Status: AC
  Administered 2021-03-04: 50 mg via ORAL

## 2021-03-04 MED ORDER — SODIUM CHLORIDE 0.9 % IV SOLN
10.0000 mg | Freq: Once | INTRAVENOUS | Status: AC
  Administered 2021-03-04: 10 mg via INTRAVENOUS
  Filled 2021-03-04: qty 10

## 2021-03-04 MED ORDER — PALONOSETRON HCL INJECTION 0.25 MG/5ML
0.2500 mg | Freq: Once | INTRAVENOUS | Status: AC
  Administered 2021-03-04: 0.25 mg via INTRAVENOUS

## 2021-03-04 MED ORDER — DOCETAXEL CHEMO INJECTION 160 MG/16ML
75.0000 mg/m2 | Freq: Once | INTRAVENOUS | Status: AC
  Administered 2021-03-04: 150 mg via INTRAVENOUS
  Filled 2021-03-04: qty 15

## 2021-03-04 MED ORDER — LOPERAMIDE HCL 2 MG PO CAPS: 2.0000 mg | ORAL_CAPSULE | ORAL | 1 refills | Status: DC

## 2021-03-04 MED ORDER — SODIUM CHLORIDE 0.9% FLUSH
10.0000 mL | INTRAVENOUS | Status: DC | PRN
Start: 2021-03-04 — End: 2021-03-04
  Administered 2021-03-04: 10 mL via INTRAVENOUS
  Filled 2021-03-04: qty 10

## 2021-03-04 MED ORDER — PALONOSETRON HCL INJECTION 0.25 MG/5ML
INTRAVENOUS | Status: AC
  Filled 2021-03-04: qty 5

## 2021-03-04 MED ORDER — ACETAMINOPHEN 325 MG PO TABS
650.0000 mg | ORAL_TABLET | Freq: Once | ORAL | Status: AC
  Administered 2021-03-04: 650 mg via ORAL

## 2021-03-04 MED ORDER — HEPARIN SOD (PORK) LOCK FLUSH 100 UNIT/ML IV SOLN
500.0000 [IU] | Freq: Once | INTRAVENOUS | Status: AC | PRN
  Administered 2021-03-04: 500 [IU]
  Filled 2021-03-04: qty 5

## 2021-03-04 MED ORDER — TRASTUZUMAB-ANNS CHEMO 150 MG IV SOLR
8.0000 mg/kg | Freq: Once | INTRAVENOUS | Status: AC
  Administered 2021-03-04: 672 mg via INTRAVENOUS
  Filled 2021-03-04: qty 32

## 2021-03-04 MED ORDER — SODIUM CHLORIDE 0.9 % IV SOLN
Freq: Once | INTRAVENOUS | Status: AC
  Filled 2021-03-04: qty 250

## 2021-03-04 NOTE — Progress Notes (Signed)
Patient denies new problems/concerns today.   °
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
## 2021-03-04 NOTE — Patient Instructions (Signed)
[facility]ENTER [place] [facility]  Discharge Instructions: Thank you for choosing [facility] to provide your oncology and hematology care.  If you have a lab appointment with the Cancer Center, please go directly to the Cancer Center and check in at the registration area.  Wear comfortable clothing and clothing appropriate for easy access to any Portacath or PICC line.   We strive to give you quality time with your provider. You may need to reschedule your appointment if you arrive late (15 or more minutes).  Arriving late affects you and other patients whose appointments are after yours.  Also, if you miss three or more appointments without notifying the office, you may be dismissed from the clinic at the provider's discretion.      For prescription refill requests, have your pharmacy contact our office and allow 72 hours for refills to be completed.      To help prevent nausea and vomiting after your treatment, we encourage you to take your nausea medication as directed.  BELOW ARE SYMPTOMS THAT SHOULD BE REPORTED IMMEDIATELY: *FEVER GREATER THAN 100.4 F (38 C) OR HIGHER *CHILLS OR SWEATING *NAUSEA AND VOMITING THAT IS NOT CONTROLLED WITH YOUR NAUSEA MEDICATION *UNUSUAL SHORTNESS OF BREATH *UNUSUAL BRUISING OR BLEEDING *URINARY PROBLEMS (pain or burning when urinating, or frequent urination) *BOWEL PROBLEMS (unusual diarrhea, constipation, pain near the anus) TENDERNESS IN MOUTH AND THROAT WITH OR WITHOUT PRESENCE OF ULCERS (sore throat, sores in mouth, or a toothache) UNUSUAL RASH, SWELLING OR PAIN  UNUSUAL VAGINAL DISCHARGE OR ITCHING   Items with * indicate a potential emergency and should be followed up as soon as possible or go to the Emergency Department if any problems should occur.  Please show the CHEMOTHERAPY ALERT CARD or IMMUNOTHERAPY ALERT CARD at check-in to the Emergency Department and triage nurse.  Should you have questions after your  visit or need to cancel or reschedule your appointment, please contact [facility] C[facility] [place] [facility]  [phone] and follow the prompts.  Office hours are 8:00 a.m. to 4:30 p.m. Monday - Friday. Please note that voicemails left after 4:00 p.m. may not be returned until the following business day.  We are closed weekends and major holidays. You have access to a nurse at all times for urgent questions. Please call the main number to the clinic [phone] and follow the prompts.  For any non-urgent questions, you may also contact your provider using MyChart. We now offer e-Visits for anyone 18 and older to request care online for non-urgent symptoms. For details visit [email or web address].   Also download the MyChart app! Go to the app store, search "MyChart", open the app, select [facility], and log in with your MyChart username and password.  Due to Covid, a mask is required upon entering the hospital/clinic. If you do not have a mask, one will be given to you upon arrival. For doctor visits, patients may have 1 support person aged 18 or older with them. For treatment visits, patients cannot have anyone with them due to current Covid guidelines and our immunocompromised population.  

## 2021-03-05 ENCOUNTER — Telehealth: Payer: Self-pay

## 2021-03-05 NOTE — Telephone Encounter (Signed)
Telephone call to patient for follow up after receiving first infusion.   Patient states infusion went great.  States eating good and drinking plenty of fluids.   Denies any nausea or vomiting.  Encouraged patient to call for any concerns or questions. 

## 2021-03-07 ENCOUNTER — Inpatient Hospital Stay: Payer: Medicare Other

## 2021-03-07 DIAGNOSIS — C50919 Malignant neoplasm of unspecified site of unspecified female breast: Secondary | ICD-10-CM

## 2021-03-07 DIAGNOSIS — Z5111 Encounter for antineoplastic chemotherapy: Secondary | ICD-10-CM | POA: Diagnosis not present

## 2021-03-07 MED ORDER — PEGFILGRASTIM-JMDB 6 MG/0.6ML ~~LOC~~ SOSY
6.0000 mg | PREFILLED_SYRINGE | Freq: Once | SUBCUTANEOUS | Status: AC
  Administered 2021-03-07: 6 mg via SUBCUTANEOUS
  Filled 2021-03-07: qty 0.6

## 2021-03-08 ENCOUNTER — Other Ambulatory Visit: Payer: Self-pay

## 2021-03-08 ENCOUNTER — Telehealth: Payer: Self-pay | Admitting: *Deleted

## 2021-03-08 MED ORDER — OMEPRAZOLE 20 MG PO CPDR: 20.0000 mg | DELAYED_RELEASE_CAPSULE | Freq: Every day | ORAL | 1 refills | Status: DC

## 2021-03-08 NOTE — Telephone Encounter (Signed)
prescription for Omeprazole '20mg'$  daily sent to [facility] in [place]. Pt informed.

## 2021-03-08 NOTE — Telephone Encounter (Signed)
Patient called to report that she is experiencing sever indigestion that is burning her throat. She would like a medication for indigestion.She has tried gargling with warm salt water but has not tried an OTC medication.

## 2021-03-11 ENCOUNTER — Other Ambulatory Visit: Payer: Self-pay

## 2021-03-11 ENCOUNTER — Inpatient Hospital Stay: Payer: Medicare Other

## 2021-03-11 VITALS — BP 119/76 | HR 105 | Temp 97.1°F | Resp 18

## 2021-03-11 DIAGNOSIS — R110 Nausea: Secondary | ICD-10-CM

## 2021-03-11 DIAGNOSIS — E860 Dehydration: Secondary | ICD-10-CM

## 2021-03-11 DIAGNOSIS — Z5111 Encounter for antineoplastic chemotherapy: Secondary | ICD-10-CM | POA: Diagnosis not present

## 2021-03-11 MED ORDER — HEPARIN SOD (PORK) LOCK FLUSH 100 UNIT/ML IV SOLN
500.0000 [IU] | Freq: Once | INTRAVENOUS | Status: AC
  Administered 2021-03-11: 500 [IU] via INTRAVENOUS
  Filled 2021-03-11: qty 5

## 2021-03-11 MED ORDER — SODIUM CHLORIDE 0.9 % IV SOLN
Freq: Once | INTRAVENOUS | Status: AC
  Filled 2021-03-11: qty 250

## 2021-03-11 MED ORDER — SODIUM CHLORIDE 0.9% FLUSH
10.0000 mL | INTRAVENOUS | Status: DC | PRN
  Administered 2021-03-11: 10 mL via INTRAVENOUS
  Filled 2021-03-11: qty 10

## 2021-03-11 MED ORDER — MAGIC MOUTHWASH W/LIDOCAINE: 5.0000 mL | Freq: Four times a day (QID) | ORAL | 1 refills | Status: DC | PRN

## 2021-03-11 MED ORDER — ONDANSETRON HCL 4 MG/2ML IJ SOLN
8.0000 mg | Freq: Once | INTRAMUSCULAR | Status: AC
  Administered 2021-03-11: 8 mg via INTRAVENOUS

## 2021-03-11 MED ORDER — SODIUM CHLORIDE 0.9 % IV SOLN: 8.0000 mg | Freq: Once | INTRAVENOUS | Status: DC

## 2021-03-11 NOTE — Patient Instructions (Signed)

## 2021-03-11 NOTE — Progress Notes (Signed)
Pt had some N/V yesterday. No vomiting today. Pt having a couple of loose stools today. Mouth soreness, tongue is reddened. MD notified. Ordering magic mouthwash. Instructed pt to start imodium OTC for the diarrhea today. IVF's with zofran administered today. All abdominal discomfort gone at time of discharge. Discharged to home.

## 2021-03-12 ENCOUNTER — Telehealth: Payer: Self-pay | Admitting: Licensed Clinical Social Worker

## 2021-03-12 ENCOUNTER — Encounter: Payer: Self-pay | Admitting: Licensed Clinical Social Worker

## 2021-03-12 ENCOUNTER — Ambulatory Visit: Payer: Self-pay | Admitting: Licensed Clinical Social Worker

## 2021-03-12 DIAGNOSIS — Z801 Family history of malignant neoplasm of trachea, bronchus and lung: Secondary | ICD-10-CM

## 2021-03-12 DIAGNOSIS — C50919 Malignant neoplasm of unspecified site of unspecified female breast: Secondary | ICD-10-CM

## 2021-03-12 DIAGNOSIS — Z803 Family history of malignant neoplasm of breast: Secondary | ICD-10-CM

## 2021-03-12 DIAGNOSIS — Z1379 Encounter for other screening for genetic and chromosomal anomalies: Secondary | ICD-10-CM | POA: Insufficient documentation

## 2021-03-12 DIAGNOSIS — Z8051 Family history of malignant neoplasm of kidney: Secondary | ICD-10-CM

## 2021-03-12 DIAGNOSIS — Z808 Family history of malignant neoplasm of other organs or systems: Secondary | ICD-10-CM

## 2021-03-12 NOTE — Progress Notes (Signed)
HPI:  Ms. Rhody was previously seen in the [place] clinic due to a personal and family history of breast cancer and concerns regarding a hereditary predisposition to cancer. Please refer to our prior cancer genetics clinic note for more information regarding our discussion, assessment and recommendations, at the time. Ms. Lochridge recent genetic test results were disclosed to her, as were recommendations warranted by these results. These results and recommendations are discussed in more detail below.  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 No pathogenic variants identified on the [facility] Multi-Cancer+RNA Panel. The report date is 03/09/2021.  The Multi-Cancer Panel + RNA offered by [facility] includes sequencing and/or deletion duplication testing of the following 84 genes: AIP, ALK, APC, ATM, AXIN2,BAP1,  BARD1, BLM, BMPR1A, BRCA1, BRCA2, BRIP1, CASR, CDC73, CDH1, CDK4, CDKN1B, CDKN1C, CDKN2A (p14ARF), CDKN2A (p16INK4a), CEBPA, CHEK2, CTNNA1, DICER1, DIS3L2, EGFR (c.2369C>T, p.Thr790Met variant only), EPCAM (Deletion/duplication testing only), FH, FLCN, GATA2, GPC3, GREM1 (Promoter region deletion/duplication testing only), HOXB13 (c.251G>A, p.Gly84Glu), HRAS, KIT, MAX, MEN1, MET, MITF (c.952G>A, p.Glu318Lys variant only), MLH1, MSH2, MSH3, MSH6, MUTYH, NBN, NF1, NF2, NTHL1, PALB2, PDGFRA, PHOX2B, PMS2, POLD1, POLE, POT1, PRKAR1A, PTCH1, PTEN, RAD50, RAD51C, RAD51D, RB1, RECQL4, RET, RUNX1, SDHAF2, SDHA (sequence changes only), SDHB, SDHC, SDHD, SMAD4, SMARCA4, SMARCB1, SMARCE1, STK11,  SUFU, TERC, TERT, TMEM127, TP53, TSC1, TSC2, VHL, WRN and WT1.   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  Significant diagnoses are listed below: Family History  Problem Relation Age of Onset   Coronary artery disease Mother    Hypertension Mother    Coronary artery disease Father        ?    Breast cancer Sister        dx 66 and again at 55   Stroke Sister    Hypertension Brother    Lung cancer Brother        d. 5s   Lung cancer Brother        d. 45   Breast cancer Maternal Aunt        dx 68s   Brain cancer Maternal Uncle        dx 79s   Kidney cancer Daughter 71   Asthma Daughter    Anxiety disorder Daughter    Heart disease Other    Heart attack Other    Alcohol abuse Other     Ms. Droll has 1 daughter, 1 son.  Her daughter had kidney cancer at 57. She has 4 sisters, 2 brothers, 1 maternal half sister. The two brothers had lung cancer, one died in his 1s, the other at 69, history of smoking. Her half sister had breast cancer at 84 and again at 49, no genetic testing.   Ms. Cushman mother died at  75. Patient had 11 maternal aunts/uncles. One aunt had breast cancer in her 16s and an uncle currently has brain cancer in his 12s. One female cousin had cancer, unknown type. No other known cancers on her maternal side. Maternal grandparents passed in their 5s.   Ms. Howatt father died at 55, no cancers. She has no info about his side of the family, no known cancers.    Ms. Oriordan is unaware of previous family history of genetic testing for hereditary cancer risks. Patient's maternal ancestors are of unknown descent, and paternal ancestors are of unknown descent. There is no reported Ashkenazi  Jewish ancestry. There is no known consanguinity.     GENETIC TEST RESULTS: Genetic testing reported out on 03/09/2021 through the [facility] Multi-cancer+RNA cancer panel found no pathogenic mutations.   The Multi-Cancer Panel + RNA offered by [facility] includes sequencing and/or deletion duplication testing of the following 84 genes: AIP, ALK, APC, ATM, AXIN2,BAP1,  BARD1, BLM, BMPR1A, BRCA1, BRCA2, BRIP1, CASR, CDC73, CDH1, CDK4, CDKN1B, CDKN1C, CDKN2A (p14ARF), CDKN2A (p16INK4a), CEBPA, CHEK2, CTNNA1, DICER1, DIS3L2, EGFR (c.2369C>T, p.Thr790Met variant only), EPCAM (Deletion/duplication testing only), FH, FLCN, GATA2, GPC3, GREM1 (Promoter region deletion/duplication testing only), HOXB13 (c.251G>A, p.Gly84Glu), HRAS, KIT, MAX, MEN1, MET, MITF (c.952G>A, p.Glu318Lys variant only), MLH1, MSH2, MSH3, MSH6, MUTYH, NBN, NF1, NF2, NTHL1, PALB2, PDGFRA, PHOX2B, PMS2, POLD1, POLE, POT1, PRKAR1A, PTCH1, PTEN, RAD50, RAD51C, RAD51D, RB1, RECQL4, RET, RUNX1, SDHAF2, SDHA (sequence changes only), SDHB, SDHC, SDHD, SMAD4, SMARCA4, SMARCB1, SMARCE1, STK11, SUFU, TERC, TERT, TMEM127, TP53, TSC1, TSC2, VHL, WRN and WT1.   The test report has been scanned into EPIC and is located under the Molecular Pathology section of the Results Review tab.  A portion of the result report is included below for reference.     We discussed that because current genetic testing is not perfect, it is possible there may be a gene mutation in one of these genes that current testing cannot detect, but that chance is small.  There could be another gene that has not yet been discovered, or that we have not yet tested, that is responsible for the cancer diagnoses in the family. It is also possible there is a hereditary cause for the cancer in the family that Ms. Pagett did not inherit and therefore was not identified in her testing.  Therefore, it is important to remain in touch with cancer genetics in the future so that we can continue to offer  Ms. Duling the most up to date genetic testing.   ADDITIONAL GENETIC TESTING: We discussed with Ms. Rohlfs that her genetic testing was fairly extensive.  If there are genes identified to increase cancer risk that can be analyzed in the future, we would be happy to discuss and coordinate this testing at that time.    CANCER SCREENING RECOMMENDATIONS: Ms. Orris test result is considered negative (normal).  This means that we have not identified a hereditary cause for her  personal and family history of cancer at this time. Most cancers happen by chance and this negative test suggests that her cancer may fall into this category.    While reassuring, this does not definitively rule out a hereditary predisposition to cancer. It is still possible that there could be genetic mutations that are undetectable by current technology. There could be genetic mutations in genes that have not been tested or identified to increase cancer risk.  Therefore, it is recommended she continue to follow the cancer management and screening guidelines provided by her oncology  and primary healthcare provider.   An individual's cancer risk and medical management are not determined by genetic test results alone. Overall cancer risk assessment incorporates additional factors, including personal medical history, family history, and any available genetic information that may result in a personalized plan for cancer prevention and surveillance.  RECOMMENDATIONS FOR FAMILY MEMBERS:  Relatives in this family might be at some increased risk of developing cancer, over the general population risk, simply due to the family history of cancer.  We recommended female relatives in this family have a yearly mammogram beginning at age 81, or 96 years younger than the earliest onset of cancer, an annual clinical breast exam, and perform monthly breast self-exams. Female relatives in this family should also have a gynecological exam as recommended by  their primary provider.  All family members should be referred for colonoscopy starting at age 51.    It is also possible there is a hereditary cause for the cancer in Ms. Woehrle's family that she did not inherit and therefore was not identified in her.  Based on Ms. Stamour's family history, we recommended her maternal half sister/maternal relatives have genetic counseling and testing. Ms. Hove will let us know if we can be of any assistance in coordinating genetic counseling and/or testing for these family members.  FOLLOW-UP: Lastly, we discussed with Ms. Diers that cancer genetics is a rapidly advancing field and it is possible that new genetic tests will be appropriate for her and/or her family members in the future. We encouraged her to remain in contact with cancer genetics on an annual basis so we can update her personal and family histories and let her know of advances in cancer genetics that may benefit this family.   Our contact number was provided. Ms. Quintin questions were answered to her satisfaction, and she knows she is welcome to call us at anytime with additional questions or concerns.   Faith Rogue, MS, [facility] Genetic Counselor [email or web address] Phone: [phone]

## 2021-03-12 NOTE — Telephone Encounter (Signed)
Revealed negative genetic testing.   We discussed that we do not know why she has breast cancer or why there is cancer in the family. It could be due to a different gene that we are not testing, or something our current technology cannot pick up.  It will be important for her to keep in contact with genetics to learn if additional testing may be needed in the future.  

## 2021-03-25 ENCOUNTER — Encounter: Payer: Self-pay | Admitting: Oncology

## 2021-03-25 ENCOUNTER — Inpatient Hospital Stay: Payer: Medicare Other | Admitting: Oncology

## 2021-03-25 ENCOUNTER — Inpatient Hospital Stay: Payer: Medicare Other | Attending: Oncology

## 2021-03-25 ENCOUNTER — Inpatient Hospital Stay: Payer: Medicare Other

## 2021-03-25 ENCOUNTER — Other Ambulatory Visit: Payer: Self-pay

## 2021-03-25 VITALS — BP 132/74 | HR 88

## 2021-03-25 VITALS — BP 104/65 | HR 87 | Temp 98.0°F | Resp 16 | Wt 183.0 lb

## 2021-03-25 DIAGNOSIS — Z808 Family history of malignant neoplasm of other organs or systems: Secondary | ICD-10-CM | POA: Diagnosis not present

## 2021-03-25 DIAGNOSIS — Z5189 Encounter for other specified aftercare: Secondary | ICD-10-CM | POA: Insufficient documentation

## 2021-03-25 DIAGNOSIS — C50919 Malignant neoplasm of unspecified site of unspecified female breast: Secondary | ICD-10-CM

## 2021-03-25 DIAGNOSIS — Z5112 Encounter for antineoplastic immunotherapy: Secondary | ICD-10-CM | POA: Diagnosis present

## 2021-03-25 DIAGNOSIS — Z5111 Encounter for antineoplastic chemotherapy: Secondary | ICD-10-CM | POA: Diagnosis not present

## 2021-03-25 DIAGNOSIS — M79606 Pain in leg, unspecified: Secondary | ICD-10-CM | POA: Insufficient documentation

## 2021-03-25 DIAGNOSIS — C50211 Malignant neoplasm of upper-inner quadrant of right female breast: Secondary | ICD-10-CM | POA: Diagnosis present

## 2021-03-25 DIAGNOSIS — M549 Dorsalgia, unspecified: Secondary | ICD-10-CM

## 2021-03-25 DIAGNOSIS — Z79899 Other long term (current) drug therapy: Secondary | ICD-10-CM | POA: Insufficient documentation

## 2021-03-25 DIAGNOSIS — Z95828 Presence of other vascular implants and grafts: Secondary | ICD-10-CM

## 2021-03-25 DIAGNOSIS — R040 Epistaxis: Secondary | ICD-10-CM

## 2021-03-25 DIAGNOSIS — G8929 Other chronic pain: Secondary | ICD-10-CM | POA: Diagnosis not present

## 2021-03-25 DIAGNOSIS — Z803 Family history of malignant neoplasm of breast: Secondary | ICD-10-CM | POA: Insufficient documentation

## 2021-03-25 DIAGNOSIS — E860 Dehydration: Secondary | ICD-10-CM

## 2021-03-25 DIAGNOSIS — Z801 Family history of malignant neoplasm of trachea, bronchus and lung: Secondary | ICD-10-CM | POA: Diagnosis not present

## 2021-03-25 DIAGNOSIS — Z8051 Family history of malignant neoplasm of kidney: Secondary | ICD-10-CM | POA: Diagnosis not present

## 2021-03-25 DIAGNOSIS — D638 Anemia in other chronic diseases classified elsewhere: Secondary | ICD-10-CM | POA: Insufficient documentation

## 2021-03-25 DIAGNOSIS — D649 Anemia, unspecified: Secondary | ICD-10-CM | POA: Insufficient documentation

## 2021-03-25 DIAGNOSIS — M898X9 Other specified disorders of bone, unspecified site: Secondary | ICD-10-CM | POA: Insufficient documentation

## 2021-03-25 LAB — CBC WITH DIFFERENTIAL/PLATELET
Abs Immature Granulocytes: 0.24 10*3/uL — ABNORMAL HIGH (ref 0.00–0.07)
Basophils Absolute: 0.1 10*3/uL (ref 0.0–0.1)
Basophils Relative: 0 %
Eosinophils Absolute: 0.1 10*3/uL (ref 0.0–0.5)
Eosinophils Relative: 1 %
HCT: 27.2 % — ABNORMAL LOW (ref 36.0–46.0)
Hemoglobin: 8.5 g/dL — ABNORMAL LOW (ref 12.0–15.0)
Immature Granulocytes: 2 %
Lymphocytes Relative: 17 %
Lymphs Abs: 2.3 10*3/uL (ref 0.7–4.0)
MCH: 29.8 pg (ref 26.0–34.0)
MCHC: 31.3 g/dL (ref 30.0–36.0)
MCV: 95.4 fL (ref 80.0–100.0)
Monocytes Absolute: 1.2 10*3/uL — ABNORMAL HIGH (ref 0.1–1.0)
Monocytes Relative: 9 %
Neutro Abs: 9.8 10*3/uL — ABNORMAL HIGH (ref 1.7–7.7)
Neutrophils Relative %: 71 %
Platelets: 690 10*3/uL — ABNORMAL HIGH (ref 150–400)
RBC: 2.85 MIL/uL — ABNORMAL LOW (ref 3.87–5.11)
RDW: 14.2 % (ref 11.5–15.5)
WBC: 13.7 10*3/uL — ABNORMAL HIGH (ref 4.0–10.5)
nRBC: 0 % (ref 0.0–0.2)

## 2021-03-25 LAB — FOLATE: Folate: 12.1 ng/mL (ref >5.9)

## 2021-03-25 LAB — COMPREHENSIVE METABOLIC PANEL
ALT: 19 U/L (ref 0–44)
AST: 17 U/L (ref 15–41)
Albumin: 2.9 g/dL — ABNORMAL LOW (ref 3.5–5.0)
Alkaline Phosphatase: 77 U/L (ref 38–126)
Anion gap: 9 (ref 5–15)
BUN: 13 mg/dL (ref 8–23)
CO2: 31 mmol/L (ref 22–32)
Calcium: 9.1 mg/dL (ref 8.9–10.3)
Chloride: 97 mmol/L — ABNORMAL LOW (ref 98–111)
Creatinine, Ser: 1.07 mg/dL — ABNORMAL HIGH (ref 0.44–1.00)
GFR, Estimated: 57 mL/min — ABNORMAL LOW (ref >60)
Glucose, Bld: 72 mg/dL (ref 70–99)
Potassium: 4.3 mmol/L (ref 3.5–5.1)
Sodium: 137 mmol/L (ref 135–145)
Total Bilirubin: 0.2 mg/dL — ABNORMAL LOW (ref 0.3–1.2)
Total Protein: 7.3 g/dL (ref 6.5–8.1)

## 2021-03-25 LAB — IRON AND TIBC
Iron: 25 ug/dL — ABNORMAL LOW (ref 28–170)
Saturation Ratios: 11 % (ref 10.4–31.8)
TIBC: 237 ug/dL — ABNORMAL LOW (ref 250–450)
UIBC: 212 ug/dL

## 2021-03-25 LAB — VITAMIN B12: Vitamin B-12: 2609 pg/mL — ABNORMAL HIGH (ref 180–914)

## 2021-03-25 LAB — FERRITIN: Ferritin: 136 ng/mL (ref 11–307)

## 2021-03-25 MED ORDER — SODIUM CHLORIDE 0.9 % IV SOLN
75.0000 mg/m2 | Freq: Once | INTRAVENOUS | Status: AC
  Administered 2021-03-25: 150 mg via INTRAVENOUS
  Filled 2021-03-25: qty 15

## 2021-03-25 MED ORDER — ACETAMINOPHEN 325 MG PO TABS
650.0000 mg | ORAL_TABLET | Freq: Once | ORAL | Status: AC
  Administered 2021-03-25: 650 mg via ORAL
  Filled 2021-03-25: qty 2

## 2021-03-25 MED ORDER — PALONOSETRON HCL INJECTION 0.25 MG/5ML
0.2500 mg | Freq: Once | INTRAVENOUS | Status: AC
  Administered 2021-03-25: 0.25 mg via INTRAVENOUS
  Filled 2021-03-25: qty 5

## 2021-03-25 MED ORDER — SODIUM CHLORIDE 0.9 % IV SOLN
10.0000 mg | Freq: Once | INTRAVENOUS | Status: AC
  Administered 2021-03-25: 10 mg via INTRAVENOUS
  Filled 2021-03-25: qty 10

## 2021-03-25 MED ORDER — SODIUM CHLORIDE 0.9 % IV SOLN
150.0000 mg | Freq: Once | INTRAVENOUS | Status: AC
  Administered 2021-03-25: 150 mg via INTRAVENOUS
  Filled 2021-03-25: qty 150

## 2021-03-25 MED ORDER — HEPARIN SOD (PORK) LOCK FLUSH 100 UNIT/ML IV SOLN
500.0000 [IU] | Freq: Once | INTRAVENOUS | Status: DC
  Filled 2021-03-25: qty 5

## 2021-03-25 MED ORDER — HEPARIN SOD (PORK) LOCK FLUSH 100 UNIT/ML IV SOLN
500.0000 [IU] | Freq: Once | INTRAVENOUS | Status: AC | PRN
  Administered 2021-03-25: 500 [IU]
  Filled 2021-03-25: qty 5

## 2021-03-25 MED ORDER — SODIUM CHLORIDE 0.9% FLUSH
10.0000 mL | Freq: Once | INTRAVENOUS | Status: AC
  Administered 2021-03-25: 10 mL via INTRAVENOUS
  Filled 2021-03-25: qty 10

## 2021-03-25 MED ORDER — SODIUM CHLORIDE 0.9 % IV SOLN
6.0000 mg/kg | Freq: Once | INTRAVENOUS | Status: AC
  Administered 2021-03-25: 504 mg via INTRAVENOUS
  Filled 2021-03-25: qty 24

## 2021-03-25 MED ORDER — SODIUM CHLORIDE 0.9 % IV SOLN
466.5000 mg | Freq: Once | INTRAVENOUS | Status: AC
  Administered 2021-03-25: 470 mg via INTRAVENOUS
  Filled 2021-03-25: qty 47

## 2021-03-25 MED ORDER — SODIUM CHLORIDE 0.9 % IV SOLN
Freq: Once | INTRAVENOUS | Status: AC
  Filled 2021-03-25: qty 250

## 2021-03-25 MED ORDER — SODIUM CHLORIDE 0.9 % IV SOLN
Freq: Once | INTRAVENOUS | Status: DC
  Filled 2021-03-25: qty 250

## 2021-03-25 MED ORDER — DIPHENHYDRAMINE HCL 25 MG PO CAPS
50.0000 mg | ORAL_CAPSULE | Freq: Once | ORAL | Status: AC
  Administered 2021-03-25: 50 mg via ORAL
  Filled 2021-03-25: qty 2

## 2021-03-25 NOTE — Progress Notes (Signed)
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 SV Index:   28          2D Longitudinal Strain LVOT Area:     3.14 cm    2D Strain GLS Avg:     -19.0 %  LV Volumes (MOD) LV vol d, MOD A2C: 68.2 ml LV vol d, MOD A4C: 75.9 ml LV vol s, MOD A2C: 35.0 ml LV vol s, MOD A4C: 40.8 ml LV SV MOD A2C:     33.3 ml LV SV MOD A4C:     75.9 ml LV SV MOD BP:      36.0 ml RIGHT VENTRICLE             IVC RV Basal diam:  3.10 cm     IVC diam: 1.70 cm RV S prime:     12.20 cm/s TAPSE (M-mode): 1.7 cm LEFT ATRIUM             Index       RIGHT ATRIUM           Index LA diam:        3.50 cm 1.84 cm/m  RA Area:     11.50 cm LA Vol (A2C):   55.8 ml 29.36 ml/m RA Volume:   24.10 ml  12.68 ml/m LA Vol (A4C):   35.6 ml 18.73 ml/m LA Biplane Vol: 45.1 ml 23.73 ml/m  AORTIC VALVE                   PULMONIC VALVE AV Area (Vmax):    2.53 cm    PV Vmax:       0.81 m/s AV Area (Vmean):   2.64 cm    PV Peak grad:  2.6 mmHg AV Area (VTI):     2.52 cm AV Vmax:           124.00 cm/s AV Vmean:          76.900 cm/s AV VTI:            0.212 m AV Peak Grad:      6.2 mmHg AV Mean Grad:       3.0 mmHg LVOT Vmax:         99.90 cm/s LVOT Vmean:        64.700 cm/s LVOT VTI:          0.170 m LVOT/AV VTI ratio: 0.80  AORTA Ao Root diam: 2.80 cm Ao Arch diam: 2.4 cm MITRAL VALVE MV Area (PHT): 3.19 cm    SHUNTS MV Decel Time: 238 msec    Systemic VTI:  0.17 m MV E velocity: 73.70 cm/s  Systemic Diam: 2.00 cm MV A velocity:  85.70 cm/s MV E/A ratio:  0.86 Nelva Bush MD Electronically signed by Nelva Bush MD Signature Date/Time: 02/05/2021/7:52:19 PM    Final    MM CLIP PLACEMENT RIGHT  Result Date: 01/08/2021 CLINICAL DATA:  Status post stereotactic guided core biopsy of RIGHT breast asymmetry. EXAM: 3D DIAGNOSTIC RIGHT MAMMOGRAM POST ULTRASOUND BIOPSY COMPARISON:  Previous exam(s). FINDINGS: 3D Mammographic images were obtained following stereotactic guided biopsy of focal asymmetry in the UPPER INNER QUADRANT of the RIGHT breast and placement of a coil shaped clip. The biopsy marking clip is in expected position at the site of biopsy. IMPRESSION: Appropriate positioning of the coil shaped biopsy marking clip at the site of biopsy in the [place] of the RIGHT breast. Final Assessment: Post Procedure Mammograms for Marker Placement Electronically Signed   By: Nolon Nations M.D.   On: 01/08/2021 08:44   MM RT BREAST BX W LOC DEV 1ST LESION IMAGE BX SPEC STEREO GUIDE  Addendum Date: 01/09/2021   ADDENDUM REPORT: 01/09/2021 12:06 ADDENDUM: PATHOLOGY revealed: A. BREAST, RIGHT UPPER INNER QUADRANT; STEREOTACTIC CORE NEEDLE BIOPSY: - INVASIVE MAMMARY CARCINOMA, NO SPECIAL TYPE. Size of invasive carcinoma: 5 mm in this sample. Grade 1. Ductal carcinoma in situ: Present, low-grade. Lymphovascular invasion: Not identified. Pathology results are CONCORDANT with imaging findings, per Dr. Nolon Nations. Pathology results and recommendations were discussed with patient via telephone on 01/09/2021. Patient reported doing well after the biopsy with no adverse symptoms, and only slight  tenderness at the site. Post biopsy care instructions were reviewed, questions were answered and my direct phone number was provided. Patient was encouraged to call [facility] for any additional questions or concerns related to biopsy site. Recommendation: Surgical consultation. Request for surgical consultation relayed to Al Pimple RN and Tanya Nones RN at [facility] by Electa Sniff RN on 01/09/2021. Pathology results reported by Electa Sniff RN on 01/09/2021. Electronically Signed   By: Nolon Nations M.D.   On: 01/09/2021 12:06   Addendum Date: 01/09/2021   ADDENDUM REPORT: 01/09/2021 09:38 ADDENDUM: Corrected report: Using sterile technique and 1% lidocaine and 1% lidocaine with epinephrine as local anesthetic, under stereotactic guidance, a 9 gauge vacuum assisted device was used to perform core needle biopsy of asymmetry in the UPPER INNER QUADRANT of the RIGHT breast using a craniocaudal approach. Lesion quadrant: RIGHT breast UPPER INNER QUADRANT At the conclusion of the procedure, coil shaped tissue marker clip was deployed into the biopsy cavity. Follow-up 2-view mammogram was performed and dictated separately. Electronically Signed   By: Nolon Nations M.D.   On: 01/09/2021 09:38   Result Date: 01/09/2021 CLINICAL DATA:  Patient presents for stereotactic guided core biopsy of focal asymmetry in the RIGHT breast. EXAM: RIGHT BREAST STEREOTACTIC CORE NEEDLE BIOPSY COMPARISON:  Previous exams. FINDINGS: The patient and I discussed the procedure of stereotactic-guided biopsy including benefits and alternatives. We discussed the high likelihood of a successful procedure. We discussed the risks of the procedure including infection, bleeding, tissue injury, clip migration, and inadequate sampling. Informed written consent was given. The usual time out protocol was performed immediately prior to the procedure. Using sterile technique and 1% lidocaine and 1% lidocaine with  epinephrine as local anesthetic, under stereotactic guidance, a 9 gauge vacuum assisted device was used to perform core needle biopsy of asymmetry in the UPPER-OUTER QUADRANT of the RIGHT breast using a craniocaudal approach. Lesion quadrant: RIGHT breast LOWER INNER QUADRANT At the conclusion of the procedure, coil shaped tissue marker clip  was deployed into the biopsy cavity. Follow-up 2-view mammogram was performed and dictated separately. IMPRESSION: Stereotactic-guided biopsy of RIGHT breast asymmetry. No apparent complications. Electronically Signed: By: Nolon Nations M.D. On: 01/08/2021 08:33        ASSESSMENT & PLAN:  1. 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 Invasive carcinoma of breast ([place])   3. Encounter for antineoplastic chemotherapy   4. Port-A-Cath in place   5. Acute midline back pain, unspecified back location   6. Epistaxis   7. Normocytic anemia   Cancer Staging Invasive carcinoma of breast ([place]) Staging form: Breast, AJCC 8th Edition - Clinical stage from 01/18/2021: Stage IB (cT2, cN0, cM0, G1, ER+, PR+, HER2+) - Signed by Earlie Server, MD on 03/04/2021  #Right breast cT1c cN0 invasive carcinoma, ER/PR positive, HER2 positive tumor size appears larger, 2.5cm,  T2  on MRI,  Recommend neoadjuvant chemotherapy. Baseline Echo LVEF 55-60%.  Baseline normal CA 27-29, CEA 15-3 Labs are reviewed and discussed with patient. Proceed with Anderson treatment. Plan to add Pertuzumab if she tolerates well.   # chronic Anemia, acute on chronic, likely due to chemotherapy.   will check B12, Folate. Iron tibc ferritin Plan weekly CBC , hold tube + possible blood transfusion.   #Chronic respiratory failure continue nasal cannula oxygen. Stable.  # Back pain, acute on chronic. Recommend Xray lumbar, consider possible MRI for work up. Patient would like to defer at this point and see if symptom gets worse.  # Epistaxis. Monitor symptoms. Probably due to chronic irritation due to    We spent sufficient time to discuss many aspect of care, questions were answered to patient's satisfaction.   Family history of breast cancer, genetic testing is negative.    All questions were answered. The patient knows to call the clinic with any problems questions or concerns.  cc [name], MD   RTC  Weekly cbc + possible blood transfusion. 3 weeks, lab MD [facility]   Earlie Server, MD, PhD Hematology Oncology [facility] at [facility] Pager- [phone] 03/25/2021

## 2021-03-25 NOTE — Progress Notes (Signed)
Patient has worsening low back pain that is 7/10 today.  For the past few days she has a headache that will start as a sharp with the last one today.  Also having nosebleeds a couple times a week that is difficult to get it to stop.

## 2021-03-27 ENCOUNTER — Ambulatory Visit: Payer: Medicare Other

## 2021-03-27 ENCOUNTER — Other Ambulatory Visit: Payer: Self-pay

## 2021-03-27 ENCOUNTER — Inpatient Hospital Stay: Payer: Medicare Other

## 2021-03-27 DIAGNOSIS — C50919 Malignant neoplasm of unspecified site of unspecified female breast: Secondary | ICD-10-CM

## 2021-03-27 DIAGNOSIS — Z5111 Encounter for antineoplastic chemotherapy: Secondary | ICD-10-CM | POA: Diagnosis not present

## 2021-03-27 MED ORDER — PEGFILGRASTIM-JMDB 6 MG/0.6ML ~~LOC~~ SOSY
6.0000 mg | PREFILLED_SYRINGE | Freq: Once | SUBCUTANEOUS | Status: AC
  Administered 2021-03-27: 6 mg via SUBCUTANEOUS
  Filled 2021-03-27: qty 0.6

## 2021-03-28 ENCOUNTER — Inpatient Hospital Stay: Payer: Medicare Other

## 2021-03-31 ENCOUNTER — Other Ambulatory Visit: Payer: Self-pay | Admitting: Oncology

## 2021-04-01 ENCOUNTER — Other Ambulatory Visit: Payer: Medicare Other

## 2021-04-01 ENCOUNTER — Encounter: Payer: Self-pay | Admitting: Oncology

## 2021-04-01 ENCOUNTER — Other Ambulatory Visit: Payer: Self-pay

## 2021-04-01 ENCOUNTER — Inpatient Hospital Stay: Payer: Medicare Other

## 2021-04-01 ENCOUNTER — Inpatient Hospital Stay (HOSPITAL_BASED_OUTPATIENT_CLINIC_OR_DEPARTMENT_OTHER): Payer: Medicare Other | Admitting: Oncology

## 2021-04-01 VITALS — BP 116/98 | HR 102 | Temp 98.2°F

## 2021-04-01 DIAGNOSIS — E860 Dehydration: Secondary | ICD-10-CM | POA: Diagnosis not present

## 2021-04-01 DIAGNOSIS — D649 Anemia, unspecified: Secondary | ICD-10-CM | POA: Diagnosis not present

## 2021-04-01 DIAGNOSIS — R112 Nausea with vomiting, unspecified: Secondary | ICD-10-CM

## 2021-04-01 DIAGNOSIS — T451X5A Adverse effect of antineoplastic and immunosuppressive drugs, initial encounter: Secondary | ICD-10-CM | POA: Insufficient documentation

## 2021-04-01 DIAGNOSIS — C50919 Malignant neoplasm of unspecified site of unspecified female breast: Secondary | ICD-10-CM

## 2021-04-01 DIAGNOSIS — K521 Toxic gastroenteritis and colitis: Secondary | ICD-10-CM

## 2021-04-01 DIAGNOSIS — Z5111 Encounter for antineoplastic chemotherapy: Secondary | ICD-10-CM | POA: Diagnosis not present

## 2021-04-01 LAB — COMPREHENSIVE METABOLIC PANEL
ALT: 27 U/L (ref 0–44)
AST: 19 U/L (ref 15–41)
Albumin: 3.5 g/dL (ref 3.5–5.0)
Alkaline Phosphatase: 99 U/L (ref 38–126)
Anion gap: 10 (ref 5–15)
BUN: 18 mg/dL (ref 8–23)
CO2: 32 mmol/L (ref 22–32)
Calcium: 9.1 mg/dL (ref 8.9–10.3)
Chloride: 93 mmol/L — ABNORMAL LOW (ref 98–111)
Creatinine, Ser: 0.82 mg/dL (ref 0.44–1.00)
GFR, Estimated: >60 mL/min (ref >60)
Glucose, Bld: 140 mg/dL — ABNORMAL HIGH (ref 70–99)
Potassium: 3.5 mmol/L (ref 3.5–5.1)
Sodium: 135 mmol/L (ref 135–145)
Total Bilirubin: 0.6 mg/dL (ref 0.3–1.2)
Total Protein: 7.7 g/dL (ref 6.5–8.1)

## 2021-04-01 LAB — CBC WITH DIFFERENTIAL/PLATELET
Abs Immature Granulocytes: 0.09 10*3/uL — ABNORMAL HIGH (ref 0.00–0.07)
Basophils Absolute: 0.1 10*3/uL (ref 0.0–0.1)
Basophils Relative: 1 %
Eosinophils Absolute: 0 10*3/uL (ref 0.0–0.5)
Eosinophils Relative: 1 %
HCT: 30.1 % — ABNORMAL LOW (ref 36.0–46.0)
Hemoglobin: 9.5 g/dL — ABNORMAL LOW (ref 12.0–15.0)
Immature Granulocytes: 1 %
Lymphocytes Relative: 17 %
Lymphs Abs: 1.1 10*3/uL (ref 0.7–4.0)
MCH: 29.5 pg (ref 26.0–34.0)
MCHC: 31.6 g/dL (ref 30.0–36.0)
MCV: 93.5 fL (ref 80.0–100.0)
Monocytes Absolute: 1 10*3/uL (ref 0.1–1.0)
Monocytes Relative: 16 %
Neutro Abs: 4 10*3/uL (ref 1.7–7.7)
Neutrophils Relative %: 64 %
Platelets: 419 10*3/uL — ABNORMAL HIGH (ref 150–400)
RBC: 3.22 MIL/uL — ABNORMAL LOW (ref 3.87–5.11)
RDW: 13.9 % (ref 11.5–15.5)
Smear Review: NORMAL
WBC: 6.3 10*3/uL (ref 4.0–10.5)
nRBC: 0 % (ref 0.0–0.2)

## 2021-04-01 LAB — ABO/RH: ABO/RH(D): A POS

## 2021-04-01 LAB — SAMPLE TO BLOOD BANK

## 2021-04-01 MED ORDER — PROMETHAZINE HCL 25 MG PO TABS: 25.0000 mg | ORAL_TABLET | Freq: Four times a day (QID) | ORAL | 1 refills | Status: DC | PRN

## 2021-04-01 MED ORDER — SODIUM CHLORIDE 0.9 % IV SOLN
10.0000 mg | Freq: Once | INTRAVENOUS | Status: AC
  Administered 2021-04-01: 10 mg via INTRAVENOUS
  Filled 2021-04-01: qty 10

## 2021-04-01 MED ORDER — SODIUM CHLORIDE 0.9 % IV SOLN
Freq: Once | INTRAVENOUS | Status: AC
  Filled 2021-04-01: qty 250

## 2021-04-01 MED ORDER — HEPARIN SOD (PORK) LOCK FLUSH 100 UNIT/ML IV SOLN
INTRAVENOUS | Status: AC
  Filled 2021-04-01: qty 5

## 2021-04-01 MED ORDER — SODIUM CHLORIDE 0.9 % IV SOLN: 8.0000 mg | Freq: Once | INTRAVENOUS | Status: DC

## 2021-04-01 MED ORDER — ONDANSETRON HCL 4 MG/2ML IJ SOLN
8.0000 mg | Freq: Once | INTRAMUSCULAR | Status: AC
  Administered 2021-04-01: 8 mg via INTRAVENOUS
  Filled 2021-04-01: qty 4

## 2021-04-01 NOTE — Patient Instructions (Signed)
[facility]  Discharge Instructions: Thank you for choosing [facility] to provide your oncology and hematology care.  If you have a lab appointment with the [facility], please go directly to the [facility] and check in at the registration area.  Wear comfortable clothing and clothing appropriate for easy access to any Portacath or PICC line.   We strive to give you quality time with your provider. You may need to reschedule your appointment if you arrive late (15 or more minutes).  Arriving late affects you and other patients whose appointments are after yours.  Also, if you miss three or more appointments without notifying the office, you may be dismissed from the clinic at the provider's discretion.      For prescription refill requests, have your pharmacy contact our office and allow 72 hours for refills to be completed.    Today you received the following chemotherapy and/or immunotherapy agents IVF, Zofran, decadron      To help prevent nausea and vomiting after your treatment, we encourage you to take your nausea medication as directed.  BELOW ARE SYMPTOMS THAT SHOULD BE REPORTED IMMEDIATELY: *FEVER GREATER THAN 100.4 F (38 C) OR HIGHER *CHILLS OR SWEATING *NAUSEA AND VOMITING THAT IS NOT CONTROLLED WITH YOUR NAUSEA MEDICATION *UNUSUAL SHORTNESS OF BREATH *UNUSUAL BRUISING OR BLEEDING *URINARY PROBLEMS (pain or burning when urinating, or frequent urination) *BOWEL PROBLEMS (unusual diarrhea, constipation, pain near the anus) TENDERNESS IN MOUTH AND THROAT WITH OR WITHOUT PRESENCE OF ULCERS (sore throat, sores in mouth, or a toothache) UNUSUAL RASH, SWELLING OR PAIN  UNUSUAL VAGINAL DISCHARGE OR ITCHING   Items with * indicate a potential emergency and should be followed up as soon as possible or go to the Emergency Department if any problems should occur.  Please show the CHEMOTHERAPY ALERT CARD or IMMUNOTHERAPY ALERT CARD  at check-in to the Emergency Department and triage nurse.  Should you have questions after your visit or need to cancel or reschedule your appointment, please contact [facility]  [phone] and follow the prompts.  Office hours are 8:00 a.m. to 4:30 p.m. Monday - Friday. Please note that voicemails left after 4:00 p.m. may not be returned until the following business day.  We are closed weekends and major holidays. You have access to a nurse at all times for urgent questions. Please call the main number to the clinic [phone] and follow the prompts.  For any non-urgent questions, you may also contact your provider using MyChart. We now offer e-Visits for anyone 27 and older to request care online for non-urgent symptoms. For details visit [email or web address].   Also download the MyChart app! Go to the app store, search "MyChart", open the app, select [facility], and log in with your MyChart username and password.  Due to Covid, a mask is required upon entering the hospital/clinic. If you do not have a mask, one will be given to you upon arrival. For doctor visits, patients may have 1 support person aged 11 or older with them. For treatment visits, patients cannot have anyone with them due to current Covid guidelines and our immunocompromised population.   Dehydration, Adult Dehydration is condition in which there is not enough water or other fluids in the body. This happens when a person loses more fluids than he or she takes in. Important body parts cannot work right without the right amount of fluids. Anyloss of fluids from the body can cause dehydration.  Dehydration can be mild, worse, or very bad. It should be treated right away tokeep it from getting very bad. What are the causes? This condition may be caused by: Conditions that cause loss of water or other fluids, such as: Watery poop (diarrhea). Vomiting. Sweating a lot. Peeing (urinating) a  lot. Not drinking enough fluids, especially when you: Are ill. Are doing things that take a lot of energy to do. Other illnesses and conditions, such as fever or infection. Certain medicines, such as medicines that take extra fluid out of the body (diuretics). Lack of safe drinking water. Not being able to get enough water and food. What increases the risk? The following factors may make you more likely to develop this condition: Having a long-term (chronic) illness that has not been treated the right way, such as: Diabetes. Heart disease. Kidney disease. Being 58 years of age or older. Having a disability. Living in a place that is high above the ground or sea (high in altitude). The thinner, dried air causes more fluid loss. Doing exercises that put stress on your body for a long time. What are the signs or symptoms? Symptoms of dehydration depend on how bad it is. Mild or worse dehydration Thirst. Dry lips or dry mouth. Feeling dizzy or light-headed, especially when you stand up from sitting. Muscle cramps. Your body making: Dark pee (urine). Pee may be the color of tea. Less pee than normal. Less tears than normal. Headache. Very bad dehydration Changes in skin. Skin may: Be cold to the touch (clammy). Be blotchy or pale. Not go back to normal right after you lightly pinch it and let it go. Little or no tears, pee, or sweat. Changes in vital signs, such as: Fast breathing. Low blood pressure. Weak pulse. Pulse that is more than 100 beats a minute when you are sitting still. Other changes, such as: Feeling very thirsty. Eyes that look hollow (sunken). Cold hands and feet. Being mixed up (confused). Being very tired (lethargic) or having trouble waking from sleep. Short-term weight loss. Loss of consciousness. How is this treated? Treatment for this condition depends on how bad it is. Treatment should start right away. Do not wait until your condition gets very  bad. Very bad dehydration is an emergency. You will need to go to a hospital. Mild or worse dehydration can be treated at home. You may be asked to: Drink more fluids. Drink an oral rehydration solution (ORS). This drink helps get the right amounts of fluids and salts and minerals in the blood (electrolytes). Very bad dehydration can be treated: With fluids through an IV tube. By getting normal levels of salts and minerals in your blood. This is often done by giving salts and minerals through a tube. The tube is passed through your nose and into your stomach. By treating the root cause. Follow these instructions at home: Oral rehydration solution If told by your doctor, drink an ORS: Make an ORS. Use instructions on the package. Start by drinking small amounts, about  cup (120 mL) every 5-10 minutes. Slowly drink more until you have had the amount that your doctor said to have. Eating and drinking        Drink enough clear fluid to keep your pee pale yellow. If you were told to drink an ORS, finish the ORS first. Then, start slowly drinking other clear fluids. Drink fluids such as: Water. Do not drink only water. Doing that can make the salt (sodium) level in your body  get too low. Water from ice chips you suck on. Fruit juice that you have added water to (diluted). Low-calorie sports drinks. Eat foods that have the right amounts of salts and minerals, such as: Bananas. Oranges. Potatoes. Tomatoes. Spinach. Do not drink alcohol. Avoid: Drinks that have a lot of sugar. These include: High-calorie sports drinks. Fruit juice that you did not add water to. Soda. Caffeine. Foods that are greasy or have a lot of fat or sugar. General instructions Take over-the-counter and prescription medicines only as told by your doctor. Do not take salt tablets. Doing that can make the salt level in your body get too high. Return to your normal activities as told by your doctor. Ask your  doctor what activities are safe for you. Keep all follow-up visits as told by your doctor. This is important. Contact a doctor if: You have pain in your belly (abdomen) and the pain: Gets worse. Stays in one place. You have a rash. You have a stiff neck. You get angry or annoyed (irritable) more easily than normal. You are more tired or have a harder time waking than normal. You feel: Weak or dizzy. Very thirsty. Get help right away if you have: Any symptoms of very bad dehydration. Symptoms of vomiting, such as: You cannot eat or drink without vomiting. Your vomiting gets worse or does not go away. Your vomit has blood or green stuff in it. Symptoms that get worse with treatment. A fever. A very bad headache. Problems with peeing or pooping (having a bowel movement), such as: Watery poop that gets worse or does not go away. Blood in your poop (stool). This may cause poop to look black and tarry. Not peeing in 6-8 hours. Peeing only a small amount of very dark pee in 6-8 hours. Trouble breathing. These symptoms may be an emergency. Do not wait to see if the symptoms will go away. Get medical help right away. Call your local emergency services (911 in the [place] Do not drive yourself to the hospital. Summary Dehydration is a condition in which there is not enough water or other fluids in the body. This happens when a person loses more fluids than he or she takes in. Treatment for this condition depends on how bad it is. Treatment should be started right away. Do not wait until your condition gets very bad. Drink enough clear fluid to keep your pee pale yellow. If you were told to drink an oral rehydration solution (ORS), finish the ORS first. Then, start slowly drinking other clear fluids. Take over-the-counter and prescription medicines only as told by your doctor. Get help right away if you have any symptoms of very bad dehydration. This information is not intended to replace  advice given to you by your health care provider. Make sure you discuss any questions you have with your healthcare provider. Document Revised: 03/17/2019 Document Reviewed: 03/17/2019 Elsevier Patient Education  [employer].   Ondansetron Injection What is this medication? ONDANSETRON (on DAN se tron) prevents nausea and vomiting from chemotherapy, radiation, or surgery. It works by blocking substances in the body that may cause nausea or vomiting. It belongs to a groupd of medications calledantiemetics. This medicine may be used for other purposes; ask your health care provider orpharmacist if you have questions. COMMON BRAND NAME(S): Zofran What should I tell my care team before I take this medication? They need to know if you have any of these conditions: Heart disease History of irregular heartbeat Liver disease  Low levels of magnesium or potassium in the blood An unusual or allergic reaction to ondansetron, granisetron, other medications, foods, dyes, or preservatives Pregnant or trying to get pregnant Breast-feeding How should I use this medication? This medication is for infusion into a vein. It is given in a hospital orclinic. Talk to your care team regarding the use of this medication in children.Special care may be needed. Overdosage: If you think you have taken too much of this medicine contact apoison control center or emergency room at once. NOTE: This medicine is only for you. Do not share this medicine with others. What if I miss a dose? This does not apply. What may interact with this medication? Do not take this medication with any of the following: Apomorphine Certain medications for fungal infections like fluconazole, itraconazole, ketoconazole, posaconazole, voriconazole Cisapride Dronedarone Pimozide Thioridazine This medication may also interact with the following: Carbamazepine Certain medications for depression, anxiety, or psychotic  disturbances Fentanyl Linezolid MAOIs like Carbex, Eldepryl, Marplan, Nardil, and Parnate Methylene blue (injected into a vein) Other medications that prolong the QT interval (cause an abnormal heart rhythm) like dofetilide, ziprasidone Phenytoin Rifampicin Tramadol This list may not describe all possible interactions. Give your health care provider a list of all the medicines, herbs, non-prescription drugs, or dietary supplements you use. Also tell them if you smoke, drink alcohol, or use illegaldrugs. Some items may interact with your medicine. What should I watch for while using this medication? Your condition will be monitored carefully while you are receiving thismedication. What side effects may I notice from receiving this medication? Side effects that you should report to your care team as soon as possible: Allergic reactions-skin rash, itching, hives, swelling of the face, lips, tongue, or throat Bowel blockage-stomach cramping, unable to have a bowel movement or pass gas, loss of appetite, vomiting Chest pain (angina)-pain, pressure, or tightness in the chest, neck, back, or arms Heart rhythm changes-fast or irregular heartbeat, dizziness, feeling faint or lightheaded, chest pain, trouble breathing Irritability, confusion, fast or irregular heartbeat, muscle stiffness, twitching muscles, sweating, high fever, seizure, chills, vomiting, diarrhea, which may be signs of serotonin syndrome Side effects that usually do not require medical attention (report to your careteam if they continue or are bothersome): Constipation Diarrhea General discomfort and fatigue Headache This list may not describe all possible side effects. Call your doctor for medical advice about side effects. You may report side effects to FDA at1-800-FDA-1088. Where should I keep my medication? This medication is given in a hospital or clinic and will not be stored at home. NOTE: This sheet is a summary. It may not  cover all possible information. If you have questions about this medicine, talk to your doctor, pharmacist, orhealth care provider.  2022 Elsevier/Gold Standard (2020-08-24 14:19:15)  Dexamethasone injection What is this medication? DEXAMETHASONE (dex a METH a sone) is a corticosteroid. It is used to treat inflammation of the skin, joints, lungs, and other organs. Common conditions treated include asthma, allergies, and arthritis. It is also used for otherconditions, like blood disorders and diseases of the adrenal glands. This medicine may be used for other purposes; ask your health care provider orpharmacist if you have questions. COMMON BRAND NAME(S): Decadron, DoubleDex, ReadySharp Dexamethasone, SimplistDexamethasone, Solurex What should I tell my care team before I take this medication? They need to know if you have any of these conditions: Cushing's syndrome diabetes glaucoma heart disease high blood pressure infection like herpes, measles, tuberculosis, or chickenpox kidney disease liver disease mental  illness myasthenia gravis osteoporosis previous heart attack seizures stomach or intestine problems thyroid disease an unusual or allergic reaction to dexamethasone, corticosteroids, other medicines, lactose, foods, dyes, or preservatives pregnant or trying to get pregnant breast-feeding How should I use this medication? This medicine is for injection into a muscle, joint, lesion, soft tissue, orvein. It is given by a health care professional in a hospital or clinic setting. Talk to your pediatrician regarding the use of this medicine in children.Special care may be needed. Overdosage: If you think you have taken too much of this medicine contact apoison control center or emergency room at once. NOTE: This medicine is only for you. Do not share this medicine with others. What if I miss a dose? This may not apply. If you are having a series of injections over a prolonged  period, try not to miss an appointment. Call your doctor or health careprofessional to reschedule if you are unable to keep an appointment. What may interact with this medication? Do not take this medicine with any of the following medications: live virus vaccines This medicine may also interact with the following medications: aminoglutethimide amphotericin B aspirin and aspirin-like medicines certain antibiotics like erythromycin, clarithromycin, and troleandomycin certain antivirals for HIV or hepatitis certain medicines for seizures like carbamazepine, phenobarbital, phenytoin certain medicines to treat myasthenia gravis cholestyramine cyclosporine digoxin diuretics ephedrine female hormones, like estrogen or progestins and birth control pills insulin or other medicines for diabetes isoniazid ketoconazole medicines that relax muscles for surgery mifepristone NSAIDs, medicines for pain and inflammation, like ibuprofen or naproxen rifampin skin tests for allergies thalidomide vaccines warfarin This list may not describe all possible interactions. Give your health care provider a list of all the medicines, herbs, non-prescription drugs, or dietary supplements you use. Also tell them if you smoke, drink alcohol, or use illegaldrugs. Some items may interact with your medicine. What should I watch for while using this medication? Visit your health care professional for regular checks on your progress. Tell your health care professional if your symptoms do not start to get better or if they get worse. Your condition will be monitored carefully while you arereceiving this medicine. Wear a medical ID bracelet or chain. Carry a card that describes your diseaseand details of your medicine and dosage times. This medicine may increase your risk of getting an infection. Call your health care professional for advice if you get a fever, chills, or sore throat, or other symptoms of a cold or flu.  Do not treat yourself. Try to avoid being around people who are sick. Call your health care professional if you are around anyone with measles, chickenpox, or if you develop sores or blistersthat do not heal properly. If you are going to need surgery or other procedures, tell your doctor or health care professional that you have taken this medicine within the last 59month. Ask your doctor or health care professional about your diet. You may need tolower the amount of salt you eat. This medicine may increase blood sugar. Ask your healthcare provider if changesin diet or medicines are needed if you have diabetes. What side effects may I notice from receiving this medication? Side effects that you should report to your doctor or health care professionalas soon as possible: allergic reactions like skin rash, itching or hives, swelling of the face, lips, or tongue bloody or black, tarry stools changes in emotions or moods changes in vision confusion, excitement, restlessness depressed mood eye pain hallucinations muscle weakness severe or sudden stomach  or belly pain signs and symptoms of high blood sugar such as being more thirsty or hungry or having to urinate more than normal. You may also feel very tired or have blurry vision. signs and symptoms of infection like fever; chills; cough; sore throat; pain or trouble passing urine swelling of ankles, feet unusual bruising or bleeding wounds that do not heal Side effects that usually do not require medical attention (report to yourdoctor or health care professional if they continue or are bothersome): increased appetite increased growth of face or body hair headache nausea, vomiting pain, redness, or irritation at site where injected skin problems, acne, thin and shiny skin trouble sleeping weight gain This list may not describe all possible side effects. Call your doctor for medical advice about side effects. You may report side effects to  FDA at1-800-FDA-1088. Where should I keep my medication? This medicine is given in a hospital or clinic and will not be stored at home. NOTE: This sheet is a summary. It may not cover all possible information. If you have questions about this medicine, talk to your doctor, pharmacist, orhealth care provider.  2022 Elsevier/Gold Standard (2019-02-15 13:51:58)

## 2021-04-01 NOTE — Progress Notes (Signed)
patient here today for possible blood and per infusion RN: "reporting that she has kept barely anything in here all week.  appears to not feel well.  bones and body ache all over.  said has been like this all week and worse over weekend.  Michela Pitcher it was bad after first treatment but not this bad."  Dr. Tasia Catchings to assess pt in infusion.

## 2021-04-01 NOTE — Progress Notes (Signed)
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 Using sterile technique and 1% lidocaine and 1% lidocaine with epinephrine as local anesthetic, under stereotactic guidance, a 9 gauge vacuum assisted device was used to perform core needle biopsy of asymmetry in the UPPER-OUTER QUADRANT of the RIGHT breast using a craniocaudal approach. Lesion quadrant: RIGHT breast LOWER INNER QUADRANT At the conclusion of the procedure, coil shaped tissue marker clip was deployed into the biopsy cavity. Follow-up 2-view mammogram was performed and dictated separately. IMPRESSION: Stereotactic-guided biopsy of RIGHT breast asymmetry. No apparent complications. Electronically Signed: By: Nolon Nations M.D. On: 01/08/2021 08:33        ASSESSMENT & PLAN:  1. 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 Invasive carcinoma of breast (HCC)   3. Dehydration   4. Normocytic anemia   5. Chemotherapy induced nausea and vomiting   6. Chemotherapy induced diarrhea   Cancer Staging Invasive  carcinoma of breast ([place]) Staging form: Breast, AJCC 8th  Edition - Clinical stage from 01/18/2021: Stage IB (cT2, cN0, cM0, G1, ER+, PR+, HER2+) - Signed by Earlie Server, MD on 03/04/2021  #Right breast cT1c cN0 invasive carcinoma, ER/PR positive, HER2 positive tumor size appears larger, 2.5cm,  T2  on MRI,  Recommend neoadjuvant chemotherapy. Baseline Echo LVEF 55-60%.  Baseline normal CA 27-29, CEA 15-3 Patient is status post [facility] x2.  It appears that she is having some moderate difficulties after cycle 2 chemotherapy.  #Chemotherapy-induced nausea and vomiting.  Patient will receive IV Zofran 8 mg x 1. IV fluid normal saline 1 L x 1. She has tried oral Zofran and Compazine with no improvement of her symptoms.  Prescription of Phenergan 25 mg every 6 hours as needed sent to pharmacy.  Discussed with patient about instructions.  #Chemotherapy-induced diarrhea, she not able to provide a sample today. Discussed about utilizing Imodium as instructed.  She will try when she gets home.  # chronic Anemia, acute on chronic, likely due to chemotherapy.  Adequate check B12, Folate.  Normal ferritin level, borderline iron saturation. Hemoglobin is 9.5 today, no need for blood transfusion.  #Chronic respiratory failure continue nasal cannula oxygen. Stable.  # Back pain, acute on chronic. Recommend Xray lumbar, consider possible MRI for work up. Patient would like to defer at this point and see if symptom gets worse.    Family history of breast cancer, genetic testing is negative.    All questions were answered. The patient knows to call the clinic with any problems questions or concerns.  cc [name], MD   RTC : Patient will follow-up on 822 with nurse practitioner, lab +/- IV fluid  2 weeks, lab MD [facility]   Earlie Server, MD, PhD Hematology Oncology [facility] at [facility] Pager- [phone] 04/01/2021

## 2021-04-02 ENCOUNTER — Other Ambulatory Visit: Payer: Self-pay | Admitting: *Deleted

## 2021-04-02 ENCOUNTER — Other Ambulatory Visit: Payer: Self-pay | Admitting: Oncology

## 2021-04-02 DIAGNOSIS — C50919 Malignant neoplasm of unspecified site of unspecified female breast: Secondary | ICD-10-CM

## 2021-04-02 MED ORDER — ATORVASTATIN CALCIUM 20 MG PO TABS: 20.0000 mg | ORAL_TABLET | Freq: Every day | ORAL | 0 refills | Status: DC

## 2021-04-08 ENCOUNTER — Other Ambulatory Visit: Payer: Medicare Other

## 2021-04-08 ENCOUNTER — Inpatient Hospital Stay (HOSPITAL_BASED_OUTPATIENT_CLINIC_OR_DEPARTMENT_OTHER): Payer: Medicare Other | Admitting: Oncology

## 2021-04-08 ENCOUNTER — Inpatient Hospital Stay: Payer: Medicare Other

## 2021-04-08 VITALS — BP 132/76 | HR 103 | Temp 99.1°F | Resp 18

## 2021-04-08 DIAGNOSIS — C50919 Malignant neoplasm of unspecified site of unspecified female breast: Secondary | ICD-10-CM | POA: Diagnosis not present

## 2021-04-08 DIAGNOSIS — M898X9 Other specified disorders of bone, unspecified site: Secondary | ICD-10-CM | POA: Diagnosis not present

## 2021-04-08 DIAGNOSIS — Z5111 Encounter for antineoplastic chemotherapy: Secondary | ICD-10-CM | POA: Diagnosis not present

## 2021-04-08 LAB — CBC WITH DIFFERENTIAL/PLATELET
Abs Immature Granulocytes: 0.14 10*3/uL — ABNORMAL HIGH (ref 0.00–0.07)
Basophils Absolute: 0 10*3/uL (ref 0.0–0.1)
Basophils Relative: 0 %
Eosinophils Absolute: 0 10*3/uL (ref 0.0–0.5)
Eosinophils Relative: 0 %
HCT: 27.4 % — ABNORMAL LOW (ref 36.0–46.0)
Hemoglobin: 8.6 g/dL — ABNORMAL LOW (ref 12.0–15.0)
Immature Granulocytes: 1 %
Lymphocytes Relative: 15 %
Lymphs Abs: 1.8 10*3/uL (ref 0.7–4.0)
MCH: 29.7 pg (ref 26.0–34.0)
MCHC: 31.4 g/dL (ref 30.0–36.0)
MCV: 94.5 fL (ref 80.0–100.0)
Monocytes Absolute: 0.5 10*3/uL (ref 0.1–1.0)
Monocytes Relative: 5 %
Neutro Abs: 9.4 10*3/uL — ABNORMAL HIGH (ref 1.7–7.7)
Neutrophils Relative %: 79 %
Platelets: 137 10*3/uL — ABNORMAL LOW (ref 150–400)
RBC: 2.9 MIL/uL — ABNORMAL LOW (ref 3.87–5.11)
RDW: 14.7 % (ref 11.5–15.5)
WBC: 11.9 10*3/uL — ABNORMAL HIGH (ref 4.0–10.5)
nRBC: 0 % (ref 0.0–0.2)

## 2021-04-08 LAB — COMPREHENSIVE METABOLIC PANEL
ALT: 21 U/L (ref 0–44)
AST: 15 U/L (ref 15–41)
Albumin: 3.1 g/dL — ABNORMAL LOW (ref 3.5–5.0)
Alkaline Phosphatase: 120 U/L (ref 38–126)
Anion gap: 8 (ref 5–15)
BUN: 13 mg/dL (ref 8–23)
CO2: 31 mmol/L (ref 22–32)
Calcium: 9.1 mg/dL (ref 8.9–10.3)
Chloride: 100 mmol/L (ref 98–111)
Creatinine, Ser: 0.95 mg/dL (ref 0.44–1.00)
GFR, Estimated: >60 mL/min (ref >60)
Glucose, Bld: 94 mg/dL (ref 70–99)
Potassium: 3.9 mmol/L (ref 3.5–5.1)
Sodium: 139 mmol/L (ref 135–145)
Total Bilirubin: 0.4 mg/dL (ref 0.3–1.2)
Total Protein: 7.5 g/dL (ref 6.5–8.1)

## 2021-04-08 MED ORDER — PREGABALIN 150 MG PO CAPS: 150.0000 mg | ORAL_CAPSULE | Freq: Two times a day (BID) | ORAL | 0 refills | Status: DC

## 2021-04-08 MED ORDER — DEXAMETHASONE 4 MG PO TABS: 4.0000 mg | ORAL_TABLET | Freq: Two times a day (BID) | ORAL | 0 refills | Status: DC

## 2021-04-08 NOTE — Progress Notes (Signed)
Pt reports pain in BLE; rates 9/10. Describes as "shooting up my legs".
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
## 2021-04-15 ENCOUNTER — Inpatient Hospital Stay: Payer: Medicare Other

## 2021-04-15 ENCOUNTER — Inpatient Hospital Stay: Payer: Medicare Other | Admitting: Oncology

## 2021-04-15 ENCOUNTER — Other Ambulatory Visit: Payer: Medicare Other

## 2021-04-15 ENCOUNTER — Encounter: Payer: Self-pay | Admitting: Oncology

## 2021-04-15 VITALS — BP 110/54 | HR 86 | Temp 96.1°F | Resp 16

## 2021-04-15 VITALS — BP 115/61 | HR 90 | Temp 97.1°F | Resp 18 | Wt 184.1 lb

## 2021-04-15 DIAGNOSIS — D649 Anemia, unspecified: Secondary | ICD-10-CM | POA: Diagnosis not present

## 2021-04-15 DIAGNOSIS — C50919 Malignant neoplasm of unspecified site of unspecified female breast: Secondary | ICD-10-CM | POA: Diagnosis not present

## 2021-04-15 DIAGNOSIS — M898X9 Other specified disorders of bone, unspecified site: Secondary | ICD-10-CM | POA: Diagnosis not present

## 2021-04-15 DIAGNOSIS — Z5111 Encounter for antineoplastic chemotherapy: Secondary | ICD-10-CM

## 2021-04-15 DIAGNOSIS — R112 Nausea with vomiting, unspecified: Secondary | ICD-10-CM | POA: Diagnosis not present

## 2021-04-15 DIAGNOSIS — T451X5A Adverse effect of antineoplastic and immunosuppressive drugs, initial encounter: Secondary | ICD-10-CM

## 2021-04-15 DIAGNOSIS — K521 Toxic gastroenteritis and colitis: Secondary | ICD-10-CM

## 2021-04-15 LAB — CBC WITH DIFFERENTIAL/PLATELET
Abs Immature Granulocytes: 0.14 10*3/uL — ABNORMAL HIGH (ref 0.00–0.07)
Basophils Absolute: 0 10*3/uL (ref 0.0–0.1)
Basophils Relative: 0 %
Eosinophils Absolute: 0.1 10*3/uL (ref 0.0–0.5)
Eosinophils Relative: 1 %
HCT: 28.5 % — ABNORMAL LOW (ref 36.0–46.0)
Hemoglobin: 8.9 g/dL — ABNORMAL LOW (ref 12.0–15.0)
Immature Granulocytes: 1 %
Lymphocytes Relative: 14 %
Lymphs Abs: 1.8 10*3/uL (ref 0.7–4.0)
MCH: 30.4 pg (ref 26.0–34.0)
MCHC: 31.2 g/dL (ref 30.0–36.0)
MCV: 97.3 fL (ref 80.0–100.0)
Monocytes Absolute: 0.6 10*3/uL (ref 0.1–1.0)
Monocytes Relative: 5 %
Neutro Abs: 9.7 10*3/uL — ABNORMAL HIGH (ref 1.7–7.7)
Neutrophils Relative %: 79 %
Platelets: 309 10*3/uL (ref 150–400)
RBC: 2.93 MIL/uL — ABNORMAL LOW (ref 3.87–5.11)
RDW: 17.2 % — ABNORMAL HIGH (ref 11.5–15.5)
WBC: 12.3 10*3/uL — ABNORMAL HIGH (ref 4.0–10.5)
nRBC: 0 % (ref 0.0–0.2)

## 2021-04-15 LAB — COMPREHENSIVE METABOLIC PANEL
ALT: 25 U/L (ref 0–44)
AST: 23 U/L (ref 15–41)
Albumin: 3.2 g/dL — ABNORMAL LOW (ref 3.5–5.0)
Alkaline Phosphatase: 76 U/L (ref 38–126)
Anion gap: 7 (ref 5–15)
BUN: 23 mg/dL (ref 8–23)
CO2: 31 mmol/L (ref 22–32)
Calcium: 8.8 mg/dL — ABNORMAL LOW (ref 8.9–10.3)
Chloride: 100 mmol/L (ref 98–111)
Creatinine, Ser: 0.89 mg/dL (ref 0.44–1.00)
GFR, Estimated: >60 mL/min (ref >60)
Glucose, Bld: 102 mg/dL — ABNORMAL HIGH (ref 70–99)
Potassium: 3.5 mmol/L (ref 3.5–5.1)
Sodium: 138 mmol/L (ref 135–145)
Total Bilirubin: 0.4 mg/dL (ref 0.3–1.2)
Total Protein: 6.7 g/dL (ref 6.5–8.1)

## 2021-04-15 MED ORDER — SODIUM CHLORIDE 0.9 % IV SOLN
470.0000 mg | Freq: Once | INTRAVENOUS | Status: AC
  Administered 2021-04-15: 470 mg via INTRAVENOUS
  Filled 2021-04-15: qty 47

## 2021-04-15 MED ORDER — HEPARIN SOD (PORK) LOCK FLUSH 100 UNIT/ML IV SOLN
INTRAVENOUS | Status: AC
  Administered 2021-04-15: 500 [IU] via INTRAVENOUS
  Filled 2021-04-15: qty 5

## 2021-04-15 MED ORDER — SODIUM CHLORIDE 0.9 % IV SOLN
840.0000 mg | Freq: Once | INTRAVENOUS | Status: AC
  Administered 2021-04-15: 840 mg via INTRAVENOUS
  Filled 2021-04-15: qty 28

## 2021-04-15 MED ORDER — TRASTUZUMAB-DKST CHEMO 150 MG IV SOLR
6.0000 mg/kg | Freq: Once | INTRAVENOUS | Status: AC
  Administered 2021-04-15: 504 mg via INTRAVENOUS
  Filled 2021-04-15: qty 24

## 2021-04-15 MED ORDER — ACETAMINOPHEN 325 MG PO TABS
650.0000 mg | ORAL_TABLET | Freq: Once | ORAL | Status: AC
  Administered 2021-04-15: 650 mg via ORAL
  Filled 2021-04-15: qty 2

## 2021-04-15 MED ORDER — SODIUM CHLORIDE 0.9 % IV SOLN
10.0000 mg | Freq: Once | INTRAVENOUS | Status: AC
  Administered 2021-04-15: 10 mg via INTRAVENOUS
  Filled 2021-04-15: qty 10

## 2021-04-15 MED ORDER — SODIUM CHLORIDE 0.9 % IV SOLN
Freq: Once | INTRAVENOUS | Status: AC
  Filled 2021-04-15: qty 250

## 2021-04-15 MED ORDER — SODIUM CHLORIDE 0.9% FLUSH
10.0000 mL | INTRAVENOUS | Status: DC | PRN
  Administered 2021-04-15: 10 mL via INTRAVENOUS
  Filled 2021-04-15: qty 10

## 2021-04-15 MED ORDER — SODIUM CHLORIDE 0.9 % IV SOLN
150.0000 mg | Freq: Once | INTRAVENOUS | Status: AC
  Administered 2021-04-15: 150 mg via INTRAVENOUS
  Filled 2021-04-15: qty 150

## 2021-04-15 MED ORDER — HEPARIN SOD (PORK) LOCK FLUSH 100 UNIT/ML IV SOLN
500.0000 [IU] | Freq: Once | INTRAVENOUS | Status: AC
  Filled 2021-04-15: qty 5

## 2021-04-15 MED ORDER — DIPHENHYDRAMINE HCL 25 MG PO CAPS
50.0000 mg | ORAL_CAPSULE | Freq: Once | ORAL | Status: AC
  Administered 2021-04-15: 50 mg via ORAL
  Filled 2021-04-15: qty 2

## 2021-04-15 MED ORDER — PALONOSETRON HCL INJECTION 0.25 MG/5ML
0.2500 mg | Freq: Once | INTRAVENOUS | Status: AC
  Administered 2021-04-15: 0.25 mg via INTRAVENOUS
  Filled 2021-04-15: qty 5

## 2021-04-15 MED ORDER — SODIUM CHLORIDE 0.9 % IV SOLN
75.0000 mg/m2 | Freq: Once | INTRAVENOUS | Status: AC
  Administered 2021-04-15: 150 mg via INTRAVENOUS
  Filled 2021-04-15: qty 15

## 2021-04-15 NOTE — Patient Instructions (Signed)
[facility]  Discharge Instructions: Thank you for choosing [name] to provide your oncology and hematology care.  If you have a lab appointment with the [facility], please go directly to the [facility] and check in at the registration area.  Wear comfortable clothing and clothing appropriate for easy access to any Portacath or PICC line.   We strive to give you quality time with your provider. You may need to reschedule your appointment if you arrive late (15 or more minutes).  Arriving late affects you and other patients whose appointments are after yours.  Also, if you miss three or more appointments without notifying the office, you may be dismissed from the clinic at the provider's discretion.      For prescription refill requests, have your pharmacy contact our office and allow 72 hours for refills to be completed.    Today you received the following chemotherapy and/or immunotherapy agents Ogivri, Perjeta, Taxotere, & Carboplatin      To help prevent nausea and vomiting after your treatment, we encourage you to take your nausea medication as directed.  BELOW ARE SYMPTOMS THAT SHOULD BE REPORTED IMMEDIATELY: *FEVER GREATER THAN 100.4 F (38 C) OR HIGHER *CHILLS OR SWEATING *NAUSEA AND VOMITING THAT IS NOT CONTROLLED WITH YOUR NAUSEA MEDICATION *UNUSUAL SHORTNESS OF BREATH *UNUSUAL BRUISING OR BLEEDING *URINARY PROBLEMS (pain or burning when urinating, or frequent urination) *BOWEL PROBLEMS (unusual diarrhea, constipation, pain near the anus) TENDERNESS IN MOUTH AND THROAT WITH OR WITHOUT PRESENCE OF ULCERS (sore throat, sores in mouth, or a toothache) UNUSUAL RASH, SWELLING OR PAIN  UNUSUAL VAGINAL DISCHARGE OR ITCHING   Items with * indicate a potential emergency and should be followed up as soon as possible or go to the Emergency Department if any problems should occur.  Please show the CHEMOTHERAPY ALERT CARD or  IMMUNOTHERAPY ALERT CARD at check-in to the Emergency Department and triage nurse.  Should you have questions after your visit or need to cancel or reschedule your appointment, please contact [facility]  [phone] and follow the prompts.  Office hours are 8:00 a.m. to 4:30 p.m. Monday - Friday. Please note that voicemails left after 4:00 p.m. may not be returned until the following business day.  We are closed weekends and major holidays. You have access to a nurse at all times for urgent questions. Please call the main number to the clinic [phone] and follow the prompts.  For any non-urgent questions, you may also contact your provider using MyChart. We now offer e-Visits for anyone 44 and older to request care online for non-urgent symptoms. For details visit [email or web address].   Also download the MyChart app! Go to the app store, search "MyChart", open the app, select [facility], and log in with your MyChart username and password.  Due to Covid, a mask is required upon entering the hospital/clinic. If you do not have a mask, one will be given to you upon arrival. For doctor visits, patients may have 1 support person aged 30 or older with them. For treatment visits, patients cannot have anyone with them due to current Covid guidelines and our immunocompromised population.
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
## 2021-04-15 NOTE — Progress Notes (Signed)
Pt here for follow up. No new concerns voiced.   

## 2021-04-15 NOTE — Progress Notes (Signed)
Nutrition Assessment   Reason for Assessment:   Patient identified on Malnutrition Screening report for poor appetite   ASSESSMENT: 65 year old female with triple negative breast cancer.  Past medical history of hepatitis, COPD, HTN, HLD.  Patient receiving chemotherapy.   Spoke with patient via phone for nutrition assessment.  Patient reports that her appetite is good.  She did have some days with nausea, vomiting, diarrhea after treatment but is eating better now.  Reports eating boiled eggs, chicken, salads, watermelon.     Medications: Vit B 12, Magic mouthwash, zofran, KCL, phenergan   Labs: reviewed   Anthropometrics:   Height: 65 inches Weight: 184 lb today 184 lb on 7/18 185 lb on 6/24 186 lb on 6/3 182 lb on 10/11/19 BMI: 30  Overall, stable weight, minimal weight loss   NUTRITION DIAGNOSIS: Inadequate oral intake related to cancer related treatment as evidenced by poor appetite following treatment but has improved    INTERVENTION:  Discussed importance of good nutrition during treatment.   Encouraged being proactive with nausea medications Encouraged well balanced diet including good sources of protein.    MONITORING, EVALUATION, GOAL: weight trends, intake   Next Visit: Monday, 9/19 during infusion  Tkeya Stencil B. Zenia Resides, [place], [place] Registered Dietitian [phone] (mobile)'

## 2021-04-17 ENCOUNTER — Other Ambulatory Visit: Payer: Self-pay

## 2021-04-17 ENCOUNTER — Inpatient Hospital Stay: Payer: Medicare Other

## 2021-04-17 DIAGNOSIS — C50919 Malignant neoplasm of unspecified site of unspecified female breast: Secondary | ICD-10-CM

## 2021-04-17 DIAGNOSIS — Z5111 Encounter for antineoplastic chemotherapy: Secondary | ICD-10-CM | POA: Diagnosis not present

## 2021-04-17 MED ORDER — PEGFILGRASTIM-JMDB 6 MG/0.6ML ~~LOC~~ SOSY
6.0000 mg | PREFILLED_SYRINGE | Freq: Once | SUBCUTANEOUS | Status: AC
  Administered 2021-04-17: 6 mg via SUBCUTANEOUS
  Filled 2021-04-17: qty 0.6

## 2021-04-23 ENCOUNTER — Other Ambulatory Visit: Payer: Self-pay | Admitting: Oncology

## 2021-04-24 ENCOUNTER — Encounter: Payer: Self-pay | Admitting: Oncology

## 2021-04-25 ENCOUNTER — Inpatient Hospital Stay: Payer: Medicare Other | Attending: Oncology

## 2021-04-25 DIAGNOSIS — M549 Dorsalgia, unspecified: Secondary | ICD-10-CM | POA: Insufficient documentation

## 2021-04-25 DIAGNOSIS — R112 Nausea with vomiting, unspecified: Secondary | ICD-10-CM | POA: Insufficient documentation

## 2021-04-25 DIAGNOSIS — Z170 Estrogen receptor positive status [ER+]: Secondary | ICD-10-CM | POA: Diagnosis not present

## 2021-04-25 DIAGNOSIS — Z803 Family history of malignant neoplasm of breast: Secondary | ICD-10-CM | POA: Diagnosis not present

## 2021-04-25 DIAGNOSIS — Z87891 Personal history of nicotine dependence: Secondary | ICD-10-CM | POA: Diagnosis not present

## 2021-04-25 DIAGNOSIS — C50211 Malignant neoplasm of upper-inner quadrant of right female breast: Secondary | ICD-10-CM | POA: Diagnosis present

## 2021-04-25 DIAGNOSIS — Z5111 Encounter for antineoplastic chemotherapy: Secondary | ICD-10-CM | POA: Insufficient documentation

## 2021-04-25 DIAGNOSIS — Z5189 Encounter for other specified aftercare: Secondary | ICD-10-CM | POA: Diagnosis not present

## 2021-04-25 DIAGNOSIS — R600 Localized edema: Secondary | ICD-10-CM | POA: Diagnosis not present

## 2021-04-25 DIAGNOSIS — Z801 Family history of malignant neoplasm of trachea, bronchus and lung: Secondary | ICD-10-CM | POA: Insufficient documentation

## 2021-04-25 DIAGNOSIS — Z5112 Encounter for antineoplastic immunotherapy: Secondary | ICD-10-CM | POA: Diagnosis present

## 2021-04-25 DIAGNOSIS — Z808 Family history of malignant neoplasm of other organs or systems: Secondary | ICD-10-CM | POA: Diagnosis not present

## 2021-04-25 DIAGNOSIS — J9610 Chronic respiratory failure, unspecified whether with hypoxia or hypercapnia: Secondary | ICD-10-CM | POA: Insufficient documentation

## 2021-04-25 DIAGNOSIS — D649 Anemia, unspecified: Secondary | ICD-10-CM | POA: Insufficient documentation

## 2021-04-25 DIAGNOSIS — C50919 Malignant neoplasm of unspecified site of unspecified female breast: Secondary | ICD-10-CM

## 2021-04-25 DIAGNOSIS — E876 Hypokalemia: Secondary | ICD-10-CM | POA: Diagnosis not present

## 2021-04-25 LAB — CBC WITH DIFFERENTIAL/PLATELET
Abs Immature Granulocytes: 0 10*3/uL (ref 0.00–0.07)
Band Neutrophils: 0 %
Basophils Absolute: 0 10*3/uL (ref 0.0–0.1)
Basophils Relative: 0 %
Blasts: 0 %
Eosinophils Absolute: 0 10*3/uL (ref 0.0–0.5)
Eosinophils Relative: 0 %
HCT: 28.1 % — ABNORMAL LOW (ref 36.0–46.0)
Hemoglobin: 9 g/dL — ABNORMAL LOW (ref 12.0–15.0)
Lymphocytes Relative: 18 %
Lymphs Abs: 2 10*3/uL (ref 0.7–4.0)
MCH: 30.5 pg (ref 26.0–34.0)
MCHC: 32 g/dL (ref 30.0–36.0)
MCV: 95.3 fL (ref 80.0–100.0)
Metamyelocytes Relative: 0 %
Monocytes Absolute: 0.7 10*3/uL (ref 0.1–1.0)
Monocytes Relative: 6 %
Myelocytes: 0 %
Neutro Abs: 8.2 10*3/uL — ABNORMAL HIGH (ref 1.7–7.7)
Neutrophils Relative %: 76 %
Other: 0 %
Platelets: 293 10*3/uL (ref 150–400)
Promyelocytes Relative: 0 %
RBC: 2.95 MIL/uL — ABNORMAL LOW (ref 3.87–5.11)
RDW: 16.6 % — ABNORMAL HIGH (ref 11.5–15.5)
Smear Review: ADEQUATE
WBC: 10.9 10*3/uL — ABNORMAL HIGH (ref 4.0–10.5)
nRBC: 0 % (ref 0.0–0.2)
nRBC: 0 /100 WBC

## 2021-04-25 LAB — COMPREHENSIVE METABOLIC PANEL
ALT: 23 U/L (ref 0–44)
AST: 20 U/L (ref 15–41)
Albumin: 3.1 g/dL — ABNORMAL LOW (ref 3.5–5.0)
Alkaline Phosphatase: 98 U/L (ref 38–126)
Anion gap: 9 (ref 5–15)
BUN: 12 mg/dL (ref 8–23)
CO2: 28 mmol/L (ref 22–32)
Calcium: 8.8 mg/dL — ABNORMAL LOW (ref 8.9–10.3)
Chloride: 100 mmol/L (ref 98–111)
Creatinine, Ser: 0.87 mg/dL (ref 0.44–1.00)
GFR, Estimated: >60 mL/min (ref >60)
Glucose, Bld: 116 mg/dL — ABNORMAL HIGH (ref 70–99)
Potassium: 2.9 mmol/L — ABNORMAL LOW (ref 3.5–5.1)
Sodium: 137 mmol/L (ref 135–145)
Total Bilirubin: 0.2 mg/dL — ABNORMAL LOW (ref 0.3–1.2)
Total Protein: 6.9 g/dL (ref 6.5–8.1)

## 2021-04-25 LAB — SAMPLE TO BLOOD BANK

## 2021-05-03 ENCOUNTER — Telehealth: Payer: Self-pay

## 2021-05-03 ENCOUNTER — Other Ambulatory Visit: Payer: Self-pay | Admitting: Oncology

## 2021-05-03 ENCOUNTER — Inpatient Hospital Stay: Payer: Medicare Other

## 2021-05-03 MED ORDER — POTASSIUM CHLORIDE CRYS ER 20 MEQ PO TBCR: 20.0000 meq | EXTENDED_RELEASE_TABLET | Freq: Two times a day (BID) | ORAL | 0 refills | Status: DC

## 2021-05-03 NOTE — Telephone Encounter (Signed)
-----   Message from Earlie Server, MD sent at 05/02/2021 10:34 PM EDT ----- Please arrange her to repeat BMP on 9/16 with possible IV potassium.

## 2021-05-03 NOTE — Telephone Encounter (Signed)
Patient unable to come clinic today for IV potassium. Dr. Tasia Catchings sent in oral potassium to pharmacy. Pt notified.

## 2021-05-06 ENCOUNTER — Inpatient Hospital Stay: Payer: Medicare Other

## 2021-05-06 ENCOUNTER — Encounter: Payer: Self-pay | Admitting: Oncology

## 2021-05-06 ENCOUNTER — Other Ambulatory Visit: Payer: Self-pay

## 2021-05-06 ENCOUNTER — Inpatient Hospital Stay (HOSPITAL_BASED_OUTPATIENT_CLINIC_OR_DEPARTMENT_OTHER): Payer: Medicare Other | Admitting: Oncology

## 2021-05-06 VITALS — BP 109/56 | HR 84 | Temp 98.9°F | Resp 18 | Wt 189.1 lb

## 2021-05-06 DIAGNOSIS — T451X5A Adverse effect of antineoplastic and immunosuppressive drugs, initial encounter: Secondary | ICD-10-CM

## 2021-05-06 DIAGNOSIS — R600 Localized edema: Secondary | ICD-10-CM

## 2021-05-06 DIAGNOSIS — K521 Toxic gastroenteritis and colitis: Secondary | ICD-10-CM | POA: Diagnosis not present

## 2021-05-06 DIAGNOSIS — C50919 Malignant neoplasm of unspecified site of unspecified female breast: Secondary | ICD-10-CM

## 2021-05-06 DIAGNOSIS — E876 Hypokalemia: Secondary | ICD-10-CM

## 2021-05-06 DIAGNOSIS — Z5111 Encounter for antineoplastic chemotherapy: Secondary | ICD-10-CM

## 2021-05-06 LAB — CBC WITH DIFFERENTIAL/PLATELET
Abs Immature Granulocytes: 0.34 K/uL — ABNORMAL HIGH (ref 0.00–0.07)
Basophils Absolute: 0.1 K/uL (ref 0.0–0.1)
Basophils Relative: 0 %
Eosinophils Absolute: 0 K/uL (ref 0.0–0.5)
Eosinophils Relative: 0 %
HCT: 26.3 % — ABNORMAL LOW (ref 36.0–46.0)
Hemoglobin: 8.3 g/dL — ABNORMAL LOW (ref 12.0–15.0)
Immature Granulocytes: 3 %
Lymphocytes Relative: 22 %
Lymphs Abs: 2.6 K/uL (ref 0.7–4.0)
MCH: 31 pg (ref 26.0–34.0)
MCHC: 31.6 g/dL (ref 30.0–36.0)
MCV: 98.1 fL (ref 80.0–100.0)
Monocytes Absolute: 1 K/uL (ref 0.1–1.0)
Monocytes Relative: 8 %
Neutro Abs: 7.8 K/uL — ABNORMAL HIGH (ref 1.7–7.7)
Neutrophils Relative %: 67 %
Platelets: 422 K/uL — ABNORMAL HIGH (ref 150–400)
RBC: 2.68 MIL/uL — ABNORMAL LOW (ref 3.87–5.11)
RDW: 17.5 % — ABNORMAL HIGH (ref 11.5–15.5)
WBC: 11.7 K/uL — ABNORMAL HIGH (ref 4.0–10.5)
nRBC: 0.5 % — ABNORMAL HIGH (ref 0.0–0.2)

## 2021-05-06 LAB — COMPREHENSIVE METABOLIC PANEL
ALT: 17 U/L (ref 0–44)
AST: 19 U/L (ref 15–41)
Albumin: 3 g/dL — ABNORMAL LOW (ref 3.5–5.0)
Alkaline Phosphatase: 89 U/L (ref 38–126)
Anion gap: 8 (ref 5–15)
BUN: 13 mg/dL (ref 8–23)
CO2: 34 mmol/L — ABNORMAL HIGH (ref 22–32)
Calcium: 8.9 mg/dL (ref 8.9–10.3)
Chloride: 100 mmol/L (ref 98–111)
Creatinine, Ser: 0.86 mg/dL (ref 0.44–1.00)
GFR, Estimated: >60 mL/min (ref >60)
Glucose, Bld: 82 mg/dL (ref 70–99)
Potassium: 3.3 mmol/L — ABNORMAL LOW (ref 3.5–5.1)
Sodium: 142 mmol/L (ref 135–145)
Total Bilirubin: 0.3 mg/dL (ref 0.3–1.2)
Total Protein: 6.7 g/dL (ref 6.5–8.1)

## 2021-05-06 MED ORDER — HEPARIN SOD (PORK) LOCK FLUSH 100 UNIT/ML IV SOLN
500.0000 [IU] | Freq: Once | INTRAVENOUS | Status: AC
  Administered 2021-05-06: 500 [IU] via INTRAVENOUS
  Filled 2021-05-06: qty 5

## 2021-05-06 MED ORDER — SODIUM CHLORIDE 0.9% FLUSH
10.0000 mL | INTRAVENOUS | Status: DC | PRN
  Administered 2021-05-06: 10 mL via INTRAVENOUS
  Filled 2021-05-06: qty 10

## 2021-05-06 MED ORDER — HEPARIN SOD (PORK) LOCK FLUSH 100 UNIT/ML IV SOLN
500.0000 [IU] | Freq: Once | INTRAVENOUS | Status: DC
  Filled 2021-05-06: qty 5

## 2021-05-06 NOTE — Progress Notes (Signed)
Nutrition  RD was planning on nutrition follow-up during infusion. Infusion held today.  Will follow-up at later date.  Doll Frazee B. Zenia Resides, [place], [place] Registered Dietitian [phone] (mobile)

## 2021-05-06 NOTE — Progress Notes (Signed)
Patient here for follow up. Pt reports swelling to upper and lower extremities since she started on potassium pills.
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
## 2021-05-07 ENCOUNTER — Ambulatory Visit (HOSPITAL_BASED_OUTPATIENT_CLINIC_OR_DEPARTMENT_OTHER)
Admission: RE | Admit: 2021-05-07 | Discharge: 2021-05-07 | Disposition: A | Discharge status: Home / Self Care | Payer: Medicare Other | Source: Ambulatory Visit | Attending: Oncology | Admitting: Oncology

## 2021-05-07 ENCOUNTER — Ambulatory Visit
Admission: RE | Admit: 2021-05-07 | Discharge: 2021-05-07 | Disposition: A | Discharge status: Home / Self Care | Payer: Medicare Other | Source: Ambulatory Visit | Attending: Oncology | Admitting: Oncology

## 2021-05-07 DIAGNOSIS — R600 Localized edema: Secondary | ICD-10-CM | POA: Diagnosis present
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
## 2021-05-07 NOTE — Progress Notes (Signed)
*  PRELIMINARY RESULTS* Echocardiogram 2D Echocardiogram has been performed.  Wallie Char Ozias Dicenzo 05/07/2021, 9:42 AM

## 2021-05-08 ENCOUNTER — Inpatient Hospital Stay: Payer: Medicare Other

## 2021-05-11 ENCOUNTER — Other Ambulatory Visit: Payer: Self-pay | Admitting: Oncology

## 2021-05-12 ENCOUNTER — Other Ambulatory Visit: Payer: Self-pay | Admitting: Oncology

## 2021-05-12 MED ORDER — POTASSIUM CHLORIDE CRYS ER 20 MEQ PO TBCR: 20.0000 meq | EXTENDED_RELEASE_TABLET | Freq: Two times a day (BID) | ORAL | 1 refills | Status: DC

## 2021-05-13 ENCOUNTER — Inpatient Hospital Stay: Payer: Medicare Other | Admitting: Oncology

## 2021-05-13 ENCOUNTER — Inpatient Hospital Stay: Payer: Medicare Other

## 2021-05-13 ENCOUNTER — Encounter: Payer: Self-pay | Admitting: Oncology

## 2021-05-13 ENCOUNTER — Other Ambulatory Visit: Payer: Self-pay

## 2021-05-13 VITALS — HR 86

## 2021-05-13 VITALS — BP 127/65 | HR 102 | Temp 97.8°F | Resp 16 | Wt 181.1 lb

## 2021-05-13 DIAGNOSIS — D649 Anemia, unspecified: Secondary | ICD-10-CM

## 2021-05-13 DIAGNOSIS — Z5111 Encounter for antineoplastic chemotherapy: Secondary | ICD-10-CM

## 2021-05-13 DIAGNOSIS — C50919 Malignant neoplasm of unspecified site of unspecified female breast: Secondary | ICD-10-CM

## 2021-05-13 DIAGNOSIS — Z1731 Human epidermal growth factor receptor 2 positive status: Secondary | ICD-10-CM

## 2021-05-13 DIAGNOSIS — E876 Hypokalemia: Secondary | ICD-10-CM

## 2021-05-13 LAB — CBC WITH DIFFERENTIAL/PLATELET
Abs Immature Granulocytes: 0.08 10*3/uL — ABNORMAL HIGH (ref 0.00–0.07)
Basophils Absolute: 0 10*3/uL (ref 0.0–0.1)
Basophils Relative: 0 %
Eosinophils Absolute: 0 10*3/uL (ref 0.0–0.5)
Eosinophils Relative: 0 %
HCT: 30.8 % — ABNORMAL LOW (ref 36.0–46.0)
Hemoglobin: 9.6 g/dL — ABNORMAL LOW (ref 12.0–15.0)
Immature Granulocytes: 1 %
Lymphocytes Relative: 15 %
Lymphs Abs: 1.8 10*3/uL (ref 0.7–4.0)
MCH: 30.6 pg (ref 26.0–34.0)
MCHC: 31.2 g/dL (ref 30.0–36.0)
MCV: 98.1 fL (ref 80.0–100.0)
Monocytes Absolute: 1.1 10*3/uL — ABNORMAL HIGH (ref 0.1–1.0)
Monocytes Relative: 9 %
Neutro Abs: 8.8 10*3/uL — ABNORMAL HIGH (ref 1.7–7.7)
Neutrophils Relative %: 75 %
Platelets: 549 10*3/uL — ABNORMAL HIGH (ref 150–400)
RBC: 3.14 MIL/uL — ABNORMAL LOW (ref 3.87–5.11)
RDW: 16.8 % — ABNORMAL HIGH (ref 11.5–15.5)
WBC: 11.8 10*3/uL — ABNORMAL HIGH (ref 4.0–10.5)
nRBC: 0 % (ref 0.0–0.2)

## 2021-05-13 LAB — COMPREHENSIVE METABOLIC PANEL
ALT: 16 U/L (ref 0–44)
AST: 28 U/L (ref 15–41)
Albumin: 3.4 g/dL — ABNORMAL LOW (ref 3.5–5.0)
Alkaline Phosphatase: 85 U/L (ref 38–126)
Anion gap: 12 (ref 5–15)
BUN: 21 mg/dL (ref 8–23)
CO2: 28 mmol/L (ref 22–32)
Calcium: 9.5 mg/dL (ref 8.9–10.3)
Chloride: 96 mmol/L — ABNORMAL LOW (ref 98–111)
Creatinine, Ser: 0.92 mg/dL (ref 0.44–1.00)
GFR, Estimated: >60 mL/min (ref >60)
Glucose, Bld: 107 mg/dL — ABNORMAL HIGH (ref 70–99)
Potassium: 4.3 mmol/L (ref 3.5–5.1)
Sodium: 136 mmol/L (ref 135–145)
Total Bilirubin: 0.5 mg/dL (ref 0.3–1.2)
Total Protein: 7.6 g/dL (ref 6.5–8.1)

## 2021-05-13 MED ORDER — PALONOSETRON HCL INJECTION 0.25 MG/5ML
0.2500 mg | Freq: Once | INTRAVENOUS | Status: AC
  Administered 2021-05-13: 0.25 mg via INTRAVENOUS
  Filled 2021-05-13: qty 5

## 2021-05-13 MED ORDER — DIPHENHYDRAMINE HCL 25 MG PO CAPS
50.0000 mg | ORAL_CAPSULE | Freq: Once | ORAL | Status: AC
  Administered 2021-05-13: 50 mg via ORAL
  Filled 2021-05-13: qty 2

## 2021-05-13 MED ORDER — TRASTUZUMAB-DKST CHEMO 150 MG IV SOLR
6.0000 mg/kg | Freq: Once | INTRAVENOUS | Status: AC
  Administered 2021-05-13: 504 mg via INTRAVENOUS
  Filled 2021-05-13: qty 24

## 2021-05-13 MED ORDER — SODIUM CHLORIDE 0.9 % IV SOLN
Freq: Once | INTRAVENOUS | Status: AC
  Filled 2021-05-13: qty 250

## 2021-05-13 MED ORDER — ACETAMINOPHEN 325 MG PO TABS
650.0000 mg | ORAL_TABLET | Freq: Once | ORAL | Status: AC
  Administered 2021-05-13: 650 mg via ORAL
  Filled 2021-05-13: qty 2

## 2021-05-13 MED ORDER — HEPARIN SOD (PORK) LOCK FLUSH 100 UNIT/ML IV SOLN
INTRAVENOUS | Status: AC
  Administered 2021-05-13: 500 [IU]
  Filled 2021-05-13: qty 5

## 2021-05-13 MED ORDER — SODIUM CHLORIDE 0.9 % IV SOLN
150.0000 mg | Freq: Once | INTRAVENOUS | Status: AC
  Administered 2021-05-13: 150 mg via INTRAVENOUS
  Filled 2021-05-13: qty 150

## 2021-05-13 MED ORDER — SODIUM CHLORIDE 0.9 % IV SOLN
10.0000 mg | Freq: Once | INTRAVENOUS | Status: AC
  Administered 2021-05-13: 10 mg via INTRAVENOUS
  Filled 2021-05-13: qty 10

## 2021-05-13 MED ORDER — SODIUM CHLORIDE 0.9 % IV SOLN
490.5000 mg | Freq: Once | INTRAVENOUS | Status: AC
  Administered 2021-05-13: 490 mg via INTRAVENOUS
  Filled 2021-05-13: qty 49

## 2021-05-13 MED ORDER — SODIUM CHLORIDE 0.9 % IV SOLN
75.0000 mg/m2 | Freq: Once | INTRAVENOUS | Status: AC
  Administered 2021-05-13: 150 mg via INTRAVENOUS
  Filled 2021-05-13: qty 15

## 2021-05-13 MED ORDER — SODIUM CHLORIDE 0.9 % IV SOLN
420.0000 mg | Freq: Once | INTRAVENOUS | Status: AC
  Administered 2021-05-13: 420 mg via INTRAVENOUS
  Filled 2021-05-13: qty 14

## 2021-05-13 MED ORDER — SODIUM CHLORIDE 0.9% FLUSH
10.0000 mL | Freq: Once | INTRAVENOUS | Status: AC
  Administered 2021-05-13: 10 mL via INTRAVENOUS
  Filled 2021-05-13: qty 10
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
## 2021-05-13 NOTE — Progress Notes (Signed)
Nutrition Follow-up:  Patient with triple negative breast cancer.  Patient receiving carboplatin, taxotere, perjeta, ogivri  Met with patient during infusion.  Patient reports that her appetite is good and she has been eating well.   Medications: reviewed  Labs: reviewed  Anthropometrics:   Weight 181 lb 1.6 oz today (less swelling in lower extremities  184 lb on 8/29 185 lb on 6/24 186 lb on 6/3 182 lb on 10/11/19   NUTRITION DIAGNOSIS: Inadequate oral intake improving    INTERVENTION:  Encouraged incorporating plant foods in diet, including lean animal proteins.  Monitor changes in weight    MONITORING, EVALUATION, GOAL: weight trends, intake   NEXT VISIT: Wednesday, Oct 19 after injection  Kenwood Rosiak B. Zenia Resides, [place], [place] Registered Dietitian [phone] (mobile)

## 2021-05-13 NOTE — Patient Instructions (Signed)
[facility]  Discharge Instructions: Thank you for choosing [facility] to provide your oncology and hematology care.  If you have a lab appointment with the [facility], please go directly to the [facility] and check in at the registration area.  Wear comfortable clothing and clothing appropriate for easy access to any Portacath or PICC line.   We strive to give you quality time with your provider. You may need to reschedule your appointment if you arrive late (15 or more minutes).  Arriving late affects you and other patients whose appointments are after yours.  Also, if you miss three or more appointments without notifying the office, you may be dismissed from the clinic at the provider's discretion.      For prescription refill requests, have your pharmacy contact our office and allow 72 hours for refills to be completed.    Today you received the following chemotherapy and/or immunotherapy agents PERJETA, OGIVIRI, TAXOTERE, CARBOPLATIN      To help prevent nausea and vomiting after your treatment, we encourage you to take your nausea medication as directed.  BELOW ARE SYMPTOMS THAT SHOULD BE REPORTED IMMEDIATELY: *FEVER GREATER THAN 100.4 F (38 C) OR HIGHER *CHILLS OR SWEATING *NAUSEA AND VOMITING THAT IS NOT CONTROLLED WITH YOUR NAUSEA MEDICATION *UNUSUAL SHORTNESS OF BREATH *UNUSUAL BRUISING OR BLEEDING *URINARY PROBLEMS (pain or burning when urinating, or frequent urination) *BOWEL PROBLEMS (unusual diarrhea, constipation, pain near the anus) TENDERNESS IN MOUTH AND THROAT WITH OR WITHOUT PRESENCE OF ULCERS (sore throat, sores in mouth, or a toothache) UNUSUAL RASH, SWELLING OR PAIN  UNUSUAL VAGINAL DISCHARGE OR ITCHING   Items with * indicate a potential emergency and should be followed up as soon as possible or go to the Emergency Department if any problems should occur.  Please show the CHEMOTHERAPY ALERT CARD or  IMMUNOTHERAPY ALERT CARD at check-in to the Emergency Department and triage nurse.  Should you have questions after your visit or need to cancel or reschedule your appointment, please contact [facility]  [phone] and follow the prompts.  Office hours are 8:00 a.m. to 4:30 p.m. Monday - Friday. Please note that voicemails left after 4:00 p.m. may not be returned until the following business day.  We are closed weekends and major holidays. You have access to a nurse at all times for urgent questions. Please call the main number to the clinic [phone] and follow the prompts.  For any non-urgent questions, you may also contact your provider using MyChart. We now offer e-Visits for anyone 67 and older to request care online for non-urgent symptoms. For details visit [email or web address].   Also download the MyChart app! Go to the app store, search "MyChart", open the app, select [facility], and log in with your MyChart username and password.  Due to Covid, a mask is required upon entering the hospital/clinic. If you do not have a mask, one will be given to you upon arrival. For doctor visits, patients may have 1 support person aged 67 or older with them. For treatment visits, patients cannot have anyone with them due to current Covid guidelines and our immunocompromised population.   Pertuzumab injection What is this medication? PERTUZUMAB (per TOOZ ue mab) is a monoclonal antibody. It is used to treat breast cancer. This medicine may be used for other purposes; ask your health care provider or pharmacist if you have questions. COMMON BRAND NAME(S): PERJETA What should I tell my care team  before I take this medication? They need to know if you have any of these conditions: heart disease heart failure high blood pressure history of irregular heart beat recent or ongoing radiation therapy an unusual or allergic reaction to pertuzumab, other medicines,  foods, dyes, or preservatives pregnant or trying to get pregnant breast-feeding How should I use this medication? This medicine is for infusion into a vein. It is given by a health care professional in a hospital or clinic setting. Talk to your pediatrician regarding the use of this medicine in children. Special care may be needed. Overdosage: If you think you have taken too much of this medicine contact a poison control center or emergency room at once. NOTE: This medicine is only for you. Do not share this medicine with others. What if I miss a dose? It is important not to miss your dose. Call your doctor or health care professional if you are unable to keep an appointment. What may interact with this medication? Interactions are not expected. Give your health care provider a list of all the medicines, herbs, non-prescription drugs, or dietary supplements you use. Also tell them if you smoke, drink alcohol, or use illegal drugs. Some items may interact with your medicine. This list may not describe all possible interactions. Give your health care provider a list of all the medicines, herbs, non-prescription drugs, or dietary supplements you use. Also tell them if you smoke, drink alcohol, or use illegal drugs. Some items may interact with your medicine. What should I watch for while using this medication? Your condition will be monitored carefully while you are receiving this medicine. Report any side effects. Continue your course of treatment even though you feel ill unless your doctor tells you to stop. Do not become pregnant while taking this medicine or for 7 months after stopping it. Women should inform their doctor if they wish to become pregnant or think they might be pregnant. Women of child-bearing potential will need to have a negative pregnancy test before starting this medicine. There is a potential for serious side effects to an unborn child. Talk to your health care professional or  pharmacist for more information. Do not breast-feed an infant while taking this medicine or for 7 months after stopping it. Women must use effective birth control with this medicine. Call your doctor or health care professional for advice if you get a fever, chills or sore throat, or other symptoms of a cold or flu. Do not treat yourself. Try to avoid being around people who are sick. You may experience fever, chills, and headache during the infusion. Report any side effects during the infusion to your health care professional. What side effects may I notice from receiving this medication? Side effects that you should report to your doctor or health care professional as soon as possible: breathing problems chest pain or palpitations dizziness feeling faint or lightheaded fever or chills skin rash, itching or hives sore throat swelling of the face, lips, or tongue swelling of the legs or ankles unusually weak or tired Side effects that usually do not require medical attention (report to your doctor or health care professional if they continue or are bothersome): diarrhea hair loss nausea, vomiting tiredness This list may not describe all possible side effects. Call your doctor for medical advice about side effects. You may report side effects to FDA at 1-800-FDA-1088. Where should I keep my medication? This drug is given in a hospital or clinic and will not be stored at  home. NOTE: This sheet is a summary. It may not cover all possible information. If you have questions about this medicine, talk to your doctor, pharmacist, or health care provider.  2022 Elsevier/Gold Standard (2015-09-06 12:08:50)  Trastuzumab injection for infusion What is this medication? TRASTUZUMAB (tras TOO zoo mab) is a monoclonal antibody. It is used to treat breast cancer and stomach cancer. This medicine may be used for other purposes; ask your health care provider or pharmacist if you have questions. COMMON  BRAND NAME(S): Herceptin, Janae Bridgeman, Ontruzant, Trazimera What should I tell my care team before I take this medication? They need to know if you have any of these conditions: heart disease heart failure lung or breathing disease, like asthma an unusual or allergic reaction to trastuzumab, benzyl alcohol, or other medications, foods, dyes, or preservatives pregnant or trying to get pregnant breast-feeding How should I use this medication? This drug is given as an infusion into a vein. It is administered in a hospital or clinic by a specially trained health care professional. Talk to your pediatrician regarding the use of this medicine in children. This medicine is not approved for use in children. Overdosage: If you think you have taken too much of this medicine contact a poison control center or emergency room at once. NOTE: This medicine is only for you. Do not share this medicine with others. What if I miss a dose? It is important not to miss a dose. Call your doctor or health care professional if you are unable to keep an appointment. What may interact with this medication? This medicine may interact with the following medications: certain types of chemotherapy, such as daunorubicin, doxorubicin, epirubicin, and idarubicin This list may not describe all possible interactions. Give your health care provider a list of all the medicines, herbs, non-prescription drugs, or dietary supplements you use. Also tell them if you smoke, drink alcohol, or use illegal drugs. Some items may interact with your medicine. What should I watch for while using this medication? Visit your doctor for checks on your progress. Report any side effects. Continue your course of treatment even though you feel ill unless your doctor tells you to stop. Call your doctor or health care professional for advice if you get a fever, chills or sore throat, or other symptoms of a cold or flu. Do not treat  yourself. Try to avoid being around people who are sick. You may experience fever, chills and shaking during your first infusion. These effects are usually mild and can be treated with other medicines. Report any side effects during the infusion to your health care professional. Fever and chills usually do not happen with later infusions. Do not become pregnant while taking this medicine or for 7 months after stopping it. Women should inform their doctor if they wish to become pregnant or think they might be pregnant. Women of child-bearing potential will need to have a negative pregnancy test before starting this medicine. There is a potential for serious side effects to an unborn child. Talk to your health care professional or pharmacist for more information. Do not breast-feed an infant while taking this medicine or for 7 months after stopping it. Women must use effective birth control with this medicine. What side effects may I notice from receiving this medication? Side effects that you should report to your doctor or health care professional as soon as possible: allergic reactions like skin rash, itching or hives, swelling of the face, lips, or tongue chest pain  or palpitations cough dizziness feeling faint or lightheaded, falls fever general ill feeling or flu-like symptoms signs of worsening heart failure like breathing problems; swelling in your legs and feet unusually weak or tired Side effects that usually do not require medical attention (report to your doctor or health care professional if they continue or are bothersome): bone pain changes in taste diarrhea joint pain nausea/vomiting weight loss This list may not describe all possible side effects. Call your doctor for medical advice about side effects. You may report side effects to FDA at 1-800-FDA-1088. Where should I keep my medication? This drug is given in a hospital or clinic and will not be stored at home. NOTE: This  sheet is a summary. It may not cover all possible information. If you have questions about this medicine, talk to your doctor, pharmacist, or health care provider.  2022 Elsevier/Gold Standard (2016-07-29 14:37:52)  Docetaxel injection What is this medication? DOCETAXEL (doe se TAX el) is a chemotherapy drug. It targets fast dividing cells, like cancer cells, and causes these cells to die. This medicine is used to treat many types of cancers like breast cancer, certain stomach cancers, head and neck cancer, lung cancer, and prostate cancer. This medicine may be used for other purposes; ask your health care provider or pharmacist if you have questions. COMMON BRAND NAME(S): Docefrez, Taxotere What should I tell my care team before I take this medication? They need to know if you have any of these conditions: infection (especially a virus infection such as chickenpox, cold sores, or herpes) liver disease low blood counts, like low white cell, platelet, or red cell counts an unusual or allergic reaction to docetaxel, polysorbate 80, other chemotherapy agents, other medicines, foods, dyes, or preservatives pregnant or trying to get pregnant breast-feeding How should I use this medication? This drug is given as an infusion into a vein. It is administered in a hospital or clinic by a specially trained health care professional. Talk to your pediatrician regarding the use of this medicine in children. Special care may be needed. Overdosage: If you think you have taken too much of this medicine contact a poison control center or emergency room at once. NOTE: This medicine is only for you. Do not share this medicine with others. What if I miss a dose? It is important not to miss your dose. Call your doctor or health care professional if you are unable to keep an appointment. What may interact with this medication? Do not take this medicine with any of the following medications: live virus  vaccines This medicine may also interact with the following medications: aprepitant certain antibiotics like erythromycin or clarithromycin certain antivirals for HIV or hepatitis certain medicines for fungal infections like fluconazole, itraconazole, ketoconazole, posaconazole, or voriconazole cimetidine ciprofloxacin conivaptan cyclosporine dronedarone fluvoxamine grapefruit juice imatinib verapamil This list may not describe all possible interactions. Give your health care provider a list of all the medicines, herbs, non-prescription drugs, or dietary supplements you use. Also tell them if you smoke, drink alcohol, or use illegal drugs. Some items may interact with your medicine. What should I watch for while using this medication? Your condition will be monitored carefully while you are receiving this medicine. You will need important blood work done while you are taking this medicine. Call your doctor or health care professional for advice if you get a fever, chills or sore throat, or other symptoms of a cold or flu. Do not treat yourself. This drug decreases your body's ability  to fight infections. Try to avoid being around people who are sick. Some products may contain alcohol. Ask your health care professional if this medicine contains alcohol. Be sure to tell all health care professionals you are taking this medicine. Certain medicines, like metronidazole and disulfiram, can cause an unpleasant reaction when taken with alcohol. The reaction includes flushing, headache, nausea, vomiting, sweating, and increased thirst. The reaction can last from 30 minutes to several hours. You may get drowsy or dizzy. Do not drive, use machinery, or do anything that needs mental alertness until you know how this medicine affects you. Do not stand or sit up quickly, especially if you are an older patient. This reduces the risk of dizzy or fainting spells. Alcohol may interfere with the effect of this  medicine. Talk to your health care professional about your risk of cancer. You may be more at risk for certain types of cancer if you take this medicine. Do not become pregnant while taking this medicine or for 6 months after stopping it. Women should inform their doctor if they wish to become pregnant or think they might be pregnant. There is a potential for serious side effects to an unborn child. Talk to your health care professional or pharmacist for more information. Do not breast-feed an infant while taking this medicine or for 1 week after stopping it. Males who get this medicine must use a condom during sex with females who can get pregnant. If you get a woman pregnant, the baby could have birth defects. The baby could die before they are born. You will need to continue wearing a condom for 3 months after stopping the medicine. Tell your health care provider right away if your partner becomes pregnant while you are taking this medicine. This may interfere with the ability to father a child. You should talk to your doctor or health care professional if you are concerned about your fertility. What side effects may I notice from receiving this medication? Side effects that you should report to your doctor or health care professional as soon as possible: allergic reactions like skin rash, itching or hives, swelling of the face, lips, or tongue blurred vision breathing problems changes in vision low blood counts - This drug may decrease the number of white blood cells, red blood cells and platelets. You may be at increased risk for infections and bleeding. nausea and vomiting pain, redness or irritation at site where injected pain, tingling, numbness in the hands or feet redness, blistering, peeling, or loosening of the skin, including inside the mouth signs of decreased platelets or bleeding - bruising, pinpoint red spots on the skin, black, tarry stools, nosebleeds signs of decreased red blood  cells - unusually weak or tired, fainting spells, lightheadedness signs of infection - fever or chills, cough, sore throat, pain or difficulty passing urine swelling of the ankle, feet, hands Side effects that usually do not require medical attention (report to your doctor or health care professional if they continue or are bothersome): constipation diarrhea fingernail or toenail changes hair loss loss of appetite mouth sores muscle pain This list may not describe all possible side effects. Call your doctor for medical advice about side effects. You may report side effects to FDA at 1-800-FDA-1088. Where should I keep my medication? This drug is given in a hospital or clinic and will not be stored at home. NOTE: This sheet is a summary. It may not cover all possible information. If you have questions about this medicine,  talk to your doctor, pharmacist, or health care provider.  2022 Elsevier/Gold Standard (2019-07-04 19:50:31)  Carboplatin injection What is this medication? CARBOPLATIN (KAR boe pla tin) is a chemotherapy drug. It targets fast dividing cells, like cancer cells, and causes these cells to die. This medicine is used to treat ovarian cancer and many other cancers. This medicine may be used for other purposes; ask your health care provider or pharmacist if you have questions. COMMON BRAND NAME(S): Paraplatin What should I tell my care team before I take this medication? They need to know if you have any of these conditions: blood disorders hearing problems kidney disease recent or ongoing radiation therapy an unusual or allergic reaction to carboplatin, cisplatin, other chemotherapy, other medicines, foods, dyes, or preservatives pregnant or trying to get pregnant breast-feeding How should I use this medication? This drug is usually given as an infusion into a vein. It is administered in a hospital or clinic by a specially trained health care professional. Talk to your  pediatrician regarding the use of this medicine in children. Special care may be needed. Overdosage: If you think you have taken too much of this medicine contact a poison control center or emergency room at once. NOTE: This medicine is only for you. Do not share this medicine with others. What if I miss a dose? It is important not to miss a dose. Call your doctor or health care professional if you are unable to keep an appointment. What may interact with this medication? medicines for seizures medicines to increase blood counts like filgrastim, pegfilgrastim, sargramostim some antibiotics like amikacin, gentamicin, neomycin, streptomycin, tobramycin vaccines Talk to your doctor or health care professional before taking any of these medicines: acetaminophen aspirin ibuprofen ketoprofen naproxen This list may not describe all possible interactions. Give your health care provider a list of all the medicines, herbs, non-prescription drugs, or dietary supplements you use. Also tell them if you smoke, drink alcohol, or use illegal drugs. Some items may interact with your medicine. What should I watch for while using this medication? Your condition will be monitored carefully while you are receiving this medicine. You will need important blood work done while you are taking this medicine. This drug may make you feel generally unwell. This is not uncommon, as chemotherapy can affect healthy cells as well as cancer cells. Report any side effects. Continue your course of treatment even though you feel ill unless your doctor tells you to stop. In some cases, you may be given additional medicines to help with side effects. Follow all directions for their use. Call your doctor or health care professional for advice if you get a fever, chills or sore throat, or other symptoms of a cold or flu. Do not treat yourself. This drug decreases your body's ability to fight infections. Try to avoid being around people  who are sick. This medicine may increase your risk to bruise or bleed. Call your doctor or health care professional if you notice any unusual bleeding. Be careful brushing and flossing your teeth or using a toothpick because you may get an infection or bleed more easily. If you have any dental work done, tell your dentist you are receiving this medicine. Avoid taking products that contain aspirin, acetaminophen, ibuprofen, naproxen, or ketoprofen unless instructed by your doctor. These medicines may hide a fever. Do not become pregnant while taking this medicine. Women should inform their doctor if they wish to become pregnant or think they might be pregnant. There is a  potential for serious side effects to an unborn child. Talk to your health care professional or pharmacist for more information. Do not breast-feed an infant while taking this medicine. What side effects may I notice from receiving this medication? Side effects that you should report to your doctor or health care professional as soon as possible: allergic reactions like skin rash, itching or hives, swelling of the face, lips, or tongue signs of infection - fever or chills, cough, sore throat, pain or difficulty passing urine signs of decreased platelets or bleeding - bruising, pinpoint red spots on the skin, black, tarry stools, nosebleeds signs of decreased red blood cells - unusually weak or tired, fainting spells, lightheadedness breathing problems changes in hearing changes in vision chest pain high blood pressure low blood counts - This drug may decrease the number of white blood cells, red blood cells and platelets. You may be at increased risk for infections and bleeding. nausea and vomiting pain, swelling, redness or irritation at the injection site pain, tingling, numbness in the hands or feet problems with balance, talking, walking trouble passing urine or change in the amount of urine Side effects that usually do not  require medical attention (report to your doctor or health care professional if they continue or are bothersome): hair loss loss of appetite metallic taste in the mouth or changes in taste This list may not describe all possible side effects. Call your doctor for medical advice about side effects. You may report side effects to FDA at 1-800-FDA-1088. Where should I keep my medication? This drug is given in a hospital or clinic and will not be stored at home. NOTE: This sheet is a summary. It may not cover all possible information. If you have questions about this medicine, talk to your doctor, pharmacist, or health care provider.  2022 Elsevier/Gold Standard (2007-11-09 14:38:05)

## 2021-05-15 ENCOUNTER — Inpatient Hospital Stay: Payer: Medicare Other

## 2021-05-15 ENCOUNTER — Other Ambulatory Visit: Payer: Self-pay

## 2021-05-15 DIAGNOSIS — Z5111 Encounter for antineoplastic chemotherapy: Secondary | ICD-10-CM | POA: Diagnosis not present

## 2021-05-15 DIAGNOSIS — C50919 Malignant neoplasm of unspecified site of unspecified female breast: Secondary | ICD-10-CM

## 2021-05-15 MED ORDER — PEGFILGRASTIM-JMDB 6 MG/0.6ML ~~LOC~~ SOSY
6.0000 mg | PREFILLED_SYRINGE | Freq: Once | SUBCUTANEOUS | Status: AC
  Administered 2021-05-15: 6 mg via SUBCUTANEOUS
  Filled 2021-05-15: qty 0.6

## 2021-05-24 ENCOUNTER — Other Ambulatory Visit: Payer: Self-pay | Admitting: Oncology

## 2021-05-27 ENCOUNTER — Encounter: Payer: Self-pay | Admitting: Oncology

## 2021-06-03 ENCOUNTER — Encounter: Payer: Self-pay | Admitting: Oncology

## 2021-06-03 ENCOUNTER — Other Ambulatory Visit: Payer: Self-pay

## 2021-06-03 ENCOUNTER — Inpatient Hospital Stay: Payer: Medicare Other

## 2021-06-03 ENCOUNTER — Inpatient Hospital Stay (HOSPITAL_BASED_OUTPATIENT_CLINIC_OR_DEPARTMENT_OTHER): Payer: Medicare Other | Admitting: Oncology

## 2021-06-03 ENCOUNTER — Inpatient Hospital Stay: Payer: Medicare Other | Attending: Oncology

## 2021-06-03 VITALS — BP 120/70 | HR 97 | Temp 99.3°F | Resp 18 | Wt 183.3 lb

## 2021-06-03 DIAGNOSIS — K521 Toxic gastroenteritis and colitis: Secondary | ICD-10-CM | POA: Diagnosis not present

## 2021-06-03 DIAGNOSIS — T451X5A Adverse effect of antineoplastic and immunosuppressive drugs, initial encounter: Secondary | ICD-10-CM

## 2021-06-03 DIAGNOSIS — Z5189 Encounter for other specified aftercare: Secondary | ICD-10-CM | POA: Insufficient documentation

## 2021-06-03 DIAGNOSIS — E876 Hypokalemia: Secondary | ICD-10-CM | POA: Diagnosis not present

## 2021-06-03 DIAGNOSIS — Z5112 Encounter for antineoplastic immunotherapy: Secondary | ICD-10-CM | POA: Insufficient documentation

## 2021-06-03 DIAGNOSIS — C50919 Malignant neoplasm of unspecified site of unspecified female breast: Secondary | ICD-10-CM

## 2021-06-03 DIAGNOSIS — M7989 Other specified soft tissue disorders: Secondary | ICD-10-CM | POA: Insufficient documentation

## 2021-06-03 DIAGNOSIS — Z803 Family history of malignant neoplasm of breast: Secondary | ICD-10-CM | POA: Insufficient documentation

## 2021-06-03 DIAGNOSIS — R112 Nausea with vomiting, unspecified: Secondary | ICD-10-CM | POA: Insufficient documentation

## 2021-06-03 DIAGNOSIS — C50211 Malignant neoplasm of upper-inner quadrant of right female breast: Secondary | ICD-10-CM | POA: Insufficient documentation

## 2021-06-03 DIAGNOSIS — D649 Anemia, unspecified: Secondary | ICD-10-CM | POA: Diagnosis not present

## 2021-06-03 DIAGNOSIS — Z79899 Other long term (current) drug therapy: Secondary | ICD-10-CM | POA: Diagnosis not present

## 2021-06-03 DIAGNOSIS — J9610 Chronic respiratory failure, unspecified whether with hypoxia or hypercapnia: Secondary | ICD-10-CM | POA: Diagnosis not present

## 2021-06-03 DIAGNOSIS — Z95828 Presence of other vascular implants and grafts: Secondary | ICD-10-CM

## 2021-06-03 DIAGNOSIS — Z5111 Encounter for antineoplastic chemotherapy: Secondary | ICD-10-CM | POA: Diagnosis present

## 2021-06-03 DIAGNOSIS — Z170 Estrogen receptor positive status [ER+]: Secondary | ICD-10-CM | POA: Diagnosis not present

## 2021-06-03 LAB — CBC WITH DIFFERENTIAL/PLATELET
Abs Immature Granulocytes: 0.29 10*3/uL — ABNORMAL HIGH (ref 0.00–0.07)
Basophils Absolute: 0 10*3/uL (ref 0.0–0.1)
Basophils Relative: 0 %
Eosinophils Absolute: 0 10*3/uL (ref 0.0–0.5)
Eosinophils Relative: 0 %
HCT: 28.5 % — ABNORMAL LOW (ref 36.0–46.0)
Hemoglobin: 8.9 g/dL — ABNORMAL LOW (ref 12.0–15.0)
Immature Granulocytes: 3 %
Lymphocytes Relative: 15 %
Lymphs Abs: 1.7 10*3/uL (ref 0.7–4.0)
MCH: 30.7 pg (ref 26.0–34.0)
MCHC: 31.2 g/dL (ref 30.0–36.0)
MCV: 98.3 fL (ref 80.0–100.0)
Monocytes Absolute: 0.8 10*3/uL (ref 0.1–1.0)
Monocytes Relative: 7 %
Neutro Abs: 8.4 10*3/uL — ABNORMAL HIGH (ref 1.7–7.7)
Neutrophils Relative %: 75 %
Platelets: 494 10*3/uL — ABNORMAL HIGH (ref 150–400)
RBC: 2.9 MIL/uL — ABNORMAL LOW (ref 3.87–5.11)
RDW: 17.4 % — ABNORMAL HIGH (ref 11.5–15.5)
WBC: 11.2 10*3/uL — ABNORMAL HIGH (ref 4.0–10.5)
nRBC: 0.2 % (ref 0.0–0.2)

## 2021-06-03 LAB — COMPREHENSIVE METABOLIC PANEL
ALT: 15 U/L (ref 0–44)
AST: 20 U/L (ref 15–41)
Albumin: 3.4 g/dL — ABNORMAL LOW (ref 3.5–5.0)
Alkaline Phosphatase: 71 U/L (ref 38–126)
Anion gap: 9 (ref 5–15)
BUN: 17 mg/dL (ref 8–23)
CO2: 27 mmol/L (ref 22–32)
Calcium: 9.1 mg/dL (ref 8.9–10.3)
Chloride: 101 mmol/L (ref 98–111)
Creatinine, Ser: 0.78 mg/dL (ref 0.44–1.00)
GFR, Estimated: >60 mL/min (ref >60)
Glucose, Bld: 112 mg/dL — ABNORMAL HIGH (ref 70–99)
Potassium: 4.3 mmol/L (ref 3.5–5.1)
Sodium: 137 mmol/L (ref 135–145)
Total Bilirubin: 0.1 mg/dL — ABNORMAL LOW (ref 0.3–1.2)
Total Protein: 7.2 g/dL (ref 6.5–8.1)

## 2021-06-03 MED ORDER — HEPARIN SOD (PORK) LOCK FLUSH 100 UNIT/ML IV SOLN
500.0000 [IU] | Freq: Once | INTRAVENOUS | Status: DC
  Filled 2021-06-03: qty 5

## 2021-06-03 MED ORDER — SODIUM CHLORIDE 0.9% FLUSH
10.0000 mL | Freq: Once | INTRAVENOUS | Status: AC
  Administered 2021-06-03: 10 mL via INTRAVENOUS
  Filled 2021-06-03: qty 10

## 2021-06-03 MED ORDER — SODIUM CHLORIDE 0.9 % IV SOLN
75.0000 mg/m2 | Freq: Once | INTRAVENOUS | Status: AC
  Administered 2021-06-03: 150 mg via INTRAVENOUS
  Filled 2021-06-03: qty 15

## 2021-06-03 MED ORDER — SODIUM CHLORIDE 0.9 % IV SOLN
150.0000 mg | Freq: Once | INTRAVENOUS | Status: AC
  Administered 2021-06-03: 150 mg via INTRAVENOUS
  Filled 2021-06-03: qty 150

## 2021-06-03 MED ORDER — DIPHENHYDRAMINE HCL 25 MG PO CAPS
50.0000 mg | ORAL_CAPSULE | Freq: Once | ORAL | Status: AC
  Administered 2021-06-03: 50 mg via ORAL
  Filled 2021-06-03: qty 2

## 2021-06-03 MED ORDER — HEPARIN SOD (PORK) LOCK FLUSH 100 UNIT/ML IV SOLN
500.0000 [IU] | Freq: Once | INTRAVENOUS | Status: AC | PRN
  Administered 2021-06-03: 500 [IU]
  Filled 2021-06-03: qty 5

## 2021-06-03 MED ORDER — PALONOSETRON HCL INJECTION 0.25 MG/5ML
0.2500 mg | Freq: Once | INTRAVENOUS | Status: AC
  Administered 2021-06-03: 0.25 mg via INTRAVENOUS
  Filled 2021-06-03: qty 5

## 2021-06-03 MED ORDER — SODIUM CHLORIDE 0.9 % IV SOLN
Freq: Once | INTRAVENOUS | Status: AC
  Filled 2021-06-03: qty 250

## 2021-06-03 MED ORDER — SODIUM CHLORIDE 0.9 % IV SOLN
420.0000 mg | Freq: Once | INTRAVENOUS | Status: AC
  Administered 2021-06-03: 420 mg via INTRAVENOUS
  Filled 2021-06-03: qty 14

## 2021-06-03 MED ORDER — SODIUM CHLORIDE 0.9 % IV SOLN
490.5000 mg | Freq: Once | INTRAVENOUS | Status: AC
  Administered 2021-06-03: 490 mg via INTRAVENOUS
  Filled 2021-06-03: qty 49

## 2021-06-03 MED ORDER — SODIUM CHLORIDE 0.9 % IV SOLN
10.0000 mg | Freq: Once | INTRAVENOUS | Status: AC
  Administered 2021-06-03: 10 mg via INTRAVENOUS
  Filled 2021-06-03: qty 10

## 2021-06-03 MED ORDER — ACETAMINOPHEN 325 MG PO TABS
650.0000 mg | ORAL_TABLET | Freq: Once | ORAL | Status: AC
  Administered 2021-06-03: 650 mg via ORAL
  Filled 2021-06-03: qty 2

## 2021-06-03 MED ORDER — TRASTUZUMAB-DKST CHEMO 150 MG IV SOLR
6.0000 mg/kg | Freq: Once | INTRAVENOUS | Status: AC
  Administered 2021-06-03: 504 mg via INTRAVENOUS
  Filled 2021-06-03: qty 24

## 2021-06-03 NOTE — Patient Instructions (Signed)
[facility]  Discharge Instructions: Thank you for choosing [facility] to provide your oncology and hematology care.  If you have a lab appointment with the [facility], please go directly to the [facility] and check in at the registration area.  Wear comfortable clothing and clothing appropriate for easy access to any Portacath or PICC line.   We strive to give you quality time with your provider. You may need to reschedule your appointment if you arrive late (15 or more minutes).  Arriving late affects you and other patients whose appointments are after yours.  Also, if you miss three or more appointments without notifying the office, you may be dismissed from the clinic at the provider's discretion.      For prescription refill requests, have your pharmacy contact our office and allow 72 hours for refills to be completed.    Today you received the following chemotherapy and/or immunotherapy agents : Herceptin / Perjeta / Taxotere / Carboplatin   To help prevent nausea and vomiting after your treatment, we encourage you to take your nausea medication as directed.  BELOW ARE SYMPTOMS THAT SHOULD BE REPORTED IMMEDIATELY: *FEVER GREATER THAN 100.4 F (38 C) OR HIGHER *CHILLS OR SWEATING *NAUSEA AND VOMITING THAT IS NOT CONTROLLED WITH YOUR NAUSEA MEDICATION *UNUSUAL SHORTNESS OF BREATH *UNUSUAL BRUISING OR BLEEDING *URINARY PROBLEMS (pain or burning when urinating, or frequent urination) *BOWEL PROBLEMS (unusual diarrhea, constipation, pain near the anus) TENDERNESS IN MOUTH AND THROAT WITH OR WITHOUT PRESENCE OF ULCERS (sore throat, sores in mouth, or a toothache) UNUSUAL RASH, SWELLING OR PAIN  UNUSUAL VAGINAL DISCHARGE OR ITCHING   Items with * indicate a potential emergency and should be followed up as soon as possible or go to the Emergency Department if any problems should occur.  Please show the CHEMOTHERAPY ALERT CARD or  IMMUNOTHERAPY ALERT CARD at check-in to the Emergency Department and triage nurse.  Should you have questions after your visit or need to cancel or reschedule your appointment, please contact [facility]  [phone] and follow the prompts.  Office hours are 8:00 a.m. to 4:30 p.m. Monday - Friday. Please note that voicemails left after 4:00 p.m. may not be returned until the following business day.  We are closed weekends and major holidays. You have access to a nurse at all times for urgent questions. Please call the main number to the clinic [phone] and follow the prompts.  For any non-urgent questions, you may also contact your provider using MyChart. We now offer e-Visits for anyone 69 and older to request care online for non-urgent symptoms. For details visit [email or web address].   Also download the MyChart app! Go to the app store, search "MyChart", open the app, select [facility], and log in with your MyChart username and password.  Due to Covid, a mask is required upon entering the hospital/clinic. If you do not have a mask, one will be given to you upon arrival. For doctor visits, patients may have 1 support person aged 52 or older with them. For treatment visits, patients cannot have anyone with them due to current Covid guidelines and our immunocompromised population.
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
## 2021-06-03 NOTE — Progress Notes (Signed)
Patient here for follow up. Pt reports that she continues to have bilat feet swelling and is now having some swelling to hands. She reports that she is having generalized joint pain. She is requesting refill on dexamethasone.

## 2021-06-05 ENCOUNTER — Inpatient Hospital Stay: Payer: Medicare Other

## 2021-06-05 ENCOUNTER — Other Ambulatory Visit: Payer: Self-pay

## 2021-06-05 DIAGNOSIS — C50919 Malignant neoplasm of unspecified site of unspecified female breast: Secondary | ICD-10-CM

## 2021-06-05 DIAGNOSIS — Z5111 Encounter for antineoplastic chemotherapy: Secondary | ICD-10-CM | POA: Diagnosis not present

## 2021-06-05 MED ORDER — PEGFILGRASTIM-JMDB 6 MG/0.6ML ~~LOC~~ SOSY
6.0000 mg | PREFILLED_SYRINGE | Freq: Once | SUBCUTANEOUS | Status: AC
  Administered 2021-06-05: 6 mg via SUBCUTANEOUS
  Filled 2021-06-05: qty 0.6

## 2021-06-05 NOTE — Progress Notes (Signed)
Nutrition Follow-up:  Patient with triple negative breast cancer.  Patient receiving carboplatin, taxotere, perjeta, ogivri.    Spoke with husband via phone as patient still sleeping this am.  Husband reports that patient's appetite is doing well.  "She is eating anything she wants and not having trouble with that."  Says that she still has swelling but no worse than when she came in on Monday, 10/17.  MD aware.      Medications: reviewed  Labs: reviewed  Anthropometrics:   Weight 183 lb 4.8 oz on 10/17  181 lb 1.6 oz on 9/26  184 lb on 8/29 185 lb on 6/24 186 lb on 6/3 182 lb on 10/11/19   NUTRITION DIAGNOSIS: Inadequate oral intake improved   INTERVENTION:  Encouraged well balanced diet including good sources of protein    MONITORING, EVALUATION, GOAL: weight trends, intake   NEXT VISIT: as needed  Whittney Steenson B. Zenia Resides, [place], [place] Registered Dietitian [phone] (mobile)

## 2021-06-12 ENCOUNTER — Other Ambulatory Visit: Payer: Self-pay

## 2021-06-12 DIAGNOSIS — C50919 Malignant neoplasm of unspecified site of unspecified female breast: Secondary | ICD-10-CM

## 2021-06-13 ENCOUNTER — Inpatient Hospital Stay: Payer: Medicare Other

## 2021-06-13 ENCOUNTER — Other Ambulatory Visit: Payer: Self-pay

## 2021-06-13 ENCOUNTER — Inpatient Hospital Stay (HOSPITAL_BASED_OUTPATIENT_CLINIC_OR_DEPARTMENT_OTHER): Payer: Medicare Other | Admitting: Nurse Practitioner

## 2021-06-13 ENCOUNTER — Encounter: Payer: Self-pay | Admitting: Nurse Practitioner

## 2021-06-13 VITALS — BP 114/68 | HR 124 | Temp 98.3°F | Resp 18 | Wt 178.6 lb

## 2021-06-13 DIAGNOSIS — T451X5A Adverse effect of antineoplastic and immunosuppressive drugs, initial encounter: Secondary | ICD-10-CM | POA: Diagnosis not present

## 2021-06-13 DIAGNOSIS — R432 Parageusia: Secondary | ICD-10-CM | POA: Diagnosis not present

## 2021-06-13 DIAGNOSIS — Z5111 Encounter for antineoplastic chemotherapy: Secondary | ICD-10-CM | POA: Diagnosis not present

## 2021-06-13 DIAGNOSIS — M7989 Other specified soft tissue disorders: Secondary | ICD-10-CM

## 2021-06-13 DIAGNOSIS — C50919 Malignant neoplasm of unspecified site of unspecified female breast: Secondary | ICD-10-CM

## 2021-06-13 DIAGNOSIS — D6481 Anemia due to antineoplastic chemotherapy: Secondary | ICD-10-CM | POA: Diagnosis not present

## 2021-06-13 LAB — CBC WITH DIFFERENTIAL/PLATELET
Abs Immature Granulocytes: 0.06 10*3/uL (ref 0.00–0.07)
Basophils Absolute: 0 10*3/uL (ref 0.0–0.1)
Basophils Relative: 0 %
Eosinophils Absolute: 0 10*3/uL (ref 0.0–0.5)
Eosinophils Relative: 0 %
HCT: 27.3 % — ABNORMAL LOW (ref 36.0–46.0)
Hemoglobin: 8.7 g/dL — ABNORMAL LOW (ref 12.0–15.0)
Immature Granulocytes: 1 %
Lymphocytes Relative: 23 %
Lymphs Abs: 2.7 10*3/uL (ref 0.7–4.0)
MCH: 31.4 pg (ref 26.0–34.0)
MCHC: 31.9 g/dL (ref 30.0–36.0)
MCV: 98.6 fL (ref 80.0–100.0)
Monocytes Absolute: 0.8 10*3/uL (ref 0.1–1.0)
Monocytes Relative: 7 %
Neutro Abs: 7.9 10*3/uL — ABNORMAL HIGH (ref 1.7–7.7)
Neutrophils Relative %: 69 %
Platelets: 186 10*3/uL (ref 150–400)
RBC: 2.77 MIL/uL — ABNORMAL LOW (ref 3.87–5.11)
RDW: 16.4 % — ABNORMAL HIGH (ref 11.5–15.5)
Smear Review: NORMAL
WBC: 11.5 10*3/uL — ABNORMAL HIGH (ref 4.0–10.5)
nRBC: 0.2 % (ref 0.0–0.2)

## 2021-06-13 LAB — COMPREHENSIVE METABOLIC PANEL
ALT: 20 U/L (ref 0–44)
AST: 20 U/L (ref 15–41)
Albumin: 3.6 g/dL (ref 3.5–5.0)
Alkaline Phosphatase: 96 U/L (ref 38–126)
Anion gap: 8 (ref 5–15)
BUN: 9 mg/dL (ref 8–23)
CO2: 28 mmol/L (ref 22–32)
Calcium: 8.7 mg/dL — ABNORMAL LOW (ref 8.9–10.3)
Chloride: 98 mmol/L (ref 98–111)
Creatinine, Ser: 0.98 mg/dL (ref 0.44–1.00)
GFR, Estimated: >60 mL/min (ref >60)
Glucose, Bld: 127 mg/dL — ABNORMAL HIGH (ref 70–99)
Potassium: 3.9 mmol/L (ref 3.5–5.1)
Sodium: 134 mmol/L — ABNORMAL LOW (ref 135–145)
Total Bilirubin: 0.5 mg/dL (ref 0.3–1.2)
Total Protein: 6.9 g/dL (ref 6.5–8.1)

## 2021-06-13 LAB — SAMPLE TO BLOOD BANK

## 2021-06-13 MED ORDER — SODIUM CHLORIDE 0.9% FLUSH
10.0000 mL | Freq: Once | INTRAVENOUS | Status: AC
  Administered 2021-06-13: 10 mL via INTRAVENOUS
  Filled 2021-06-13: qty 10

## 2021-06-13 MED ORDER — HEPARIN SOD (PORK) LOCK FLUSH 100 UNIT/ML IV SOLN
500.0000 [IU] | Freq: Once | INTRAVENOUS | Status: AC
  Filled 2021-06-13: qty 5

## 2021-06-13 MED ORDER — HEPARIN SOD (PORK) LOCK FLUSH 100 UNIT/ML IV SOLN
INTRAVENOUS | Status: AC
  Administered 2021-06-13: 500 [IU] via INTRAVENOUS
  Filled 2021-06-13: qty 5
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
## 2021-06-14 ENCOUNTER — Other Ambulatory Visit: Payer: Self-pay | Admitting: Oncology

## 2021-06-16 ENCOUNTER — Other Ambulatory Visit: Payer: Self-pay | Admitting: Oncology

## 2021-06-16 DIAGNOSIS — C50919 Malignant neoplasm of unspecified site of unspecified female breast: Secondary | ICD-10-CM

## 2021-06-17 ENCOUNTER — Other Ambulatory Visit: Payer: Self-pay | Admitting: *Deleted

## 2021-06-17 MED ORDER — APIXABAN 5 MG PO TABS: 5.0000 mg | ORAL_TABLET | Freq: Two times a day (BID) | ORAL | 1 refills | Status: DC

## 2021-06-17 NOTE — Telephone Encounter (Signed)
Eliquis 5mg  paper refill request received. Patient is 65 years old, weight-81kg, Crea-0.98 on 06/13/2021, Diagnosis-Afib, and last seen by Dr. [name] on 02/08/2021. Dose is appropriate based on dosing criteria. Will send in refill to requested pharmacy.

## 2021-06-19 ENCOUNTER — Other Ambulatory Visit: Payer: Self-pay

## 2021-06-19 MED ORDER — ATORVASTATIN CALCIUM 20 MG PO TABS: 20.0000 mg | ORAL_TABLET | Freq: Every day | ORAL | 0 refills | Status: DC

## 2021-06-24 ENCOUNTER — Inpatient Hospital Stay: Payer: Medicare Other | Admitting: Oncology

## 2021-06-24 ENCOUNTER — Inpatient Hospital Stay: Payer: Medicare Other | Attending: Oncology

## 2021-06-24 ENCOUNTER — Inpatient Hospital Stay: Payer: Medicare Other

## 2021-06-24 ENCOUNTER — Encounter: Payer: Self-pay | Admitting: Oncology

## 2021-06-24 ENCOUNTER — Other Ambulatory Visit: Payer: Self-pay

## 2021-06-24 VITALS — BP 121/60 | HR 52 | Temp 95.0°F | Resp 18 | Wt 186.5 lb

## 2021-06-24 DIAGNOSIS — Z5189 Encounter for other specified aftercare: Secondary | ICD-10-CM | POA: Insufficient documentation

## 2021-06-24 DIAGNOSIS — C50211 Malignant neoplasm of upper-inner quadrant of right female breast: Secondary | ICD-10-CM | POA: Diagnosis present

## 2021-06-24 DIAGNOSIS — Z79899 Other long term (current) drug therapy: Secondary | ICD-10-CM | POA: Diagnosis not present

## 2021-06-24 DIAGNOSIS — D6481 Anemia due to antineoplastic chemotherapy: Secondary | ICD-10-CM | POA: Diagnosis not present

## 2021-06-24 DIAGNOSIS — C50919 Malignant neoplasm of unspecified site of unspecified female breast: Secondary | ICD-10-CM

## 2021-06-24 DIAGNOSIS — Z5111 Encounter for antineoplastic chemotherapy: Secondary | ICD-10-CM | POA: Insufficient documentation

## 2021-06-24 DIAGNOSIS — Z5112 Encounter for antineoplastic immunotherapy: Secondary | ICD-10-CM | POA: Insufficient documentation

## 2021-06-24 DIAGNOSIS — T451X5A Adverse effect of antineoplastic and immunosuppressive drugs, initial encounter: Secondary | ICD-10-CM | POA: Diagnosis not present

## 2021-06-24 LAB — COMPREHENSIVE METABOLIC PANEL
ALT: 11 U/L (ref 0–44)
AST: 18 U/L (ref 15–41)
Albumin: 3.5 g/dL (ref 3.5–5.0)
Alkaline Phosphatase: 71 U/L (ref 38–126)
Anion gap: 7 (ref 5–15)
BUN: 14 mg/dL (ref 8–23)
CO2: 31 mmol/L (ref 22–32)
Calcium: 8.6 mg/dL — ABNORMAL LOW (ref 8.9–10.3)
Chloride: 100 mmol/L (ref 98–111)
Creatinine, Ser: 0.94 mg/dL (ref 0.44–1.00)
GFR, Estimated: >60 mL/min (ref >60)
Glucose, Bld: 96 mg/dL (ref 70–99)
Potassium: 4.2 mmol/L (ref 3.5–5.1)
Sodium: 138 mmol/L (ref 135–145)
Total Bilirubin: 0.5 mg/dL (ref 0.3–1.2)
Total Protein: 6.9 g/dL (ref 6.5–8.1)

## 2021-06-24 LAB — CBC WITH DIFFERENTIAL/PLATELET
Abs Immature Granulocytes: 0.07 10*3/uL (ref 0.00–0.07)
Basophils Absolute: 0 10*3/uL (ref 0.0–0.1)
Basophils Relative: 0 %
Eosinophils Absolute: 0 10*3/uL (ref 0.0–0.5)
Eosinophils Relative: 0 %
HCT: 26.6 % — ABNORMAL LOW (ref 36.0–46.0)
Hemoglobin: 8.5 g/dL — ABNORMAL LOW (ref 12.0–15.0)
Immature Granulocytes: 1 %
Lymphocytes Relative: 33 %
Lymphs Abs: 2.2 10*3/uL (ref 0.7–4.0)
MCH: 32.4 pg (ref 26.0–34.0)
MCHC: 32 g/dL (ref 30.0–36.0)
MCV: 101.5 fL — ABNORMAL HIGH (ref 80.0–100.0)
Monocytes Absolute: 0.8 10*3/uL (ref 0.1–1.0)
Monocytes Relative: 12 %
Neutro Abs: 3.7 10*3/uL (ref 1.7–7.7)
Neutrophils Relative %: 54 %
Platelets: 285 10*3/uL (ref 150–400)
RBC: 2.62 MIL/uL — ABNORMAL LOW (ref 3.87–5.11)
RDW: 17.9 % — ABNORMAL HIGH (ref 11.5–15.5)
WBC: 6.8 10*3/uL (ref 4.0–10.5)
nRBC: 0 % (ref 0.0–0.2)

## 2021-06-24 MED ORDER — TRASTUZUMAB-DKST CHEMO 150 MG IV SOLR
6.0000 mg/kg | Freq: Once | INTRAVENOUS | Status: AC
  Administered 2021-06-24: 504 mg via INTRAVENOUS
  Filled 2021-06-24: qty 24

## 2021-06-24 MED ORDER — SODIUM CHLORIDE 0.9 % IV SOLN
490.5000 mg | Freq: Once | INTRAVENOUS | Status: AC
  Administered 2021-06-24: 490 mg via INTRAVENOUS
  Filled 2021-06-24: qty 49

## 2021-06-24 MED ORDER — ACETAMINOPHEN 325 MG PO TABS
650.0000 mg | ORAL_TABLET | Freq: Once | ORAL | Status: AC
  Administered 2021-06-24: 650 mg via ORAL
  Filled 2021-06-24: qty 2

## 2021-06-24 MED ORDER — HEPARIN SOD (PORK) LOCK FLUSH 100 UNIT/ML IV SOLN
500.0000 [IU] | Freq: Once | INTRAVENOUS | Status: AC | PRN
  Filled 2021-06-24: qty 5

## 2021-06-24 MED ORDER — HEPARIN SOD (PORK) LOCK FLUSH 100 UNIT/ML IV SOLN
INTRAVENOUS | Status: AC
  Administered 2021-06-24: 500 [IU]
  Filled 2021-06-24: qty 5

## 2021-06-24 MED ORDER — SODIUM CHLORIDE 0.9 % IV SOLN
420.0000 mg | Freq: Once | INTRAVENOUS | Status: AC
  Administered 2021-06-24: 420 mg via INTRAVENOUS
  Filled 2021-06-24: qty 14

## 2021-06-24 MED ORDER — DEXAMETHASONE 4 MG PO TABS: 8.0000 mg | ORAL_TABLET | Freq: Two times a day (BID) | ORAL | 0 refills | Status: DC

## 2021-06-24 MED ORDER — SODIUM CHLORIDE 0.9 % IV SOLN
75.0000 mg/m2 | Freq: Once | INTRAVENOUS | Status: AC
  Administered 2021-06-24: 150 mg via INTRAVENOUS
  Filled 2021-06-24: qty 15

## 2021-06-24 MED ORDER — PALONOSETRON HCL INJECTION 0.25 MG/5ML
0.2500 mg | Freq: Once | INTRAVENOUS | Status: AC
  Administered 2021-06-24: 0.25 mg via INTRAVENOUS
  Filled 2021-06-24: qty 5

## 2021-06-24 MED ORDER — SODIUM CHLORIDE 0.9 % IV SOLN
Freq: Once | INTRAVENOUS | Status: AC
  Filled 2021-06-24: qty 250

## 2021-06-24 MED ORDER — FUROSEMIDE 20 MG PO TABS: 20.0000 mg | ORAL_TABLET | Freq: Every day | ORAL | 0 refills | Status: DC | PRN

## 2021-06-24 MED ORDER — SODIUM CHLORIDE 0.9 % IV SOLN
150.0000 mg | Freq: Once | INTRAVENOUS | Status: AC
  Administered 2021-06-24: 150 mg via INTRAVENOUS
  Filled 2021-06-24: qty 150

## 2021-06-24 MED ORDER — DIPHENHYDRAMINE HCL 25 MG PO CAPS
50.0000 mg | ORAL_CAPSULE | Freq: Once | ORAL | Status: AC
  Administered 2021-06-24: 50 mg via ORAL
  Filled 2021-06-24: qty 2

## 2021-06-24 MED ORDER — SODIUM CHLORIDE 0.9 % IV SOLN
10.0000 mg | Freq: Once | INTRAVENOUS | Status: AC
  Administered 2021-06-24: 10 mg via INTRAVENOUS
  Filled 2021-06-24: qty 10

## 2021-06-24 NOTE — Patient Instructions (Signed)
[facility]  Discharge Instructions: Thank you for choosing [facility] to provide your oncology and hematology care.  If you have a lab appointment with the [facility], please go directly to the [facility] and check in at the registration area.  Wear comfortable clothing and clothing appropriate for easy access to any Portacath or PICC line.   We strive to give you quality time with your provider. You may need to reschedule your appointment if you arrive late (15 or more minutes).  Arriving late affects you and other patients whose appointments are after yours.  Also, if you miss three or more appointments without notifying the office, you may be dismissed from the clinic at the provider's discretion.      For prescription refill requests, have your pharmacy contact our office and allow 72 hours for refills to be completed.    Today you received the following chemotherapy and/or immunotherapy agents Ogivri, Perjeta, Taxotere, Carboplatin       To help prevent nausea and vomiting after your treatment, we encourage you to take your nausea medication as directed.  BELOW ARE SYMPTOMS THAT SHOULD BE REPORTED IMMEDIATELY: *FEVER GREATER THAN 100.4 F (38 C) OR HIGHER *CHILLS OR SWEATING *NAUSEA AND VOMITING THAT IS NOT CONTROLLED WITH YOUR NAUSEA MEDICATION *UNUSUAL SHORTNESS OF BREATH *UNUSUAL BRUISING OR BLEEDING *URINARY PROBLEMS (pain or burning when urinating, or frequent urination) *BOWEL PROBLEMS (unusual diarrhea, constipation, pain near the anus) TENDERNESS IN MOUTH AND THROAT WITH OR WITHOUT PRESENCE OF ULCERS (sore throat, sores in mouth, or a toothache) UNUSUAL RASH, SWELLING OR PAIN  UNUSUAL VAGINAL DISCHARGE OR ITCHING   Items with * indicate a potential emergency and should be followed up as soon as possible or go to the Emergency Department if any problems should occur.  Please show the CHEMOTHERAPY ALERT CARD or  IMMUNOTHERAPY ALERT CARD at check-in to the Emergency Department and triage nurse.  Should you have questions after your visit or need to cancel or reschedule your appointment, please contact [facility]  [phone] and follow the prompts.  Office hours are 8:00 a.m. to 4:30 p.m. Monday - Friday. Please note that voicemails left after 4:00 p.m. may not be returned until the following business day.  We are closed weekends and major holidays. You have access to a nurse at all times for urgent questions. Please call the main number to the clinic [phone] and follow the prompts.  For any non-urgent questions, you may also contact your provider using MyChart. We now offer e-Visits for anyone 67 and older to request care online for non-urgent symptoms. For details visit [email or web address].   Also download the MyChart app! Go to the app store, search "MyChart", open the app, select [facility], and log in with your MyChart username and password.  Due to Covid, a mask is required upon entering the hospital/clinic. If you do not have a mask, one will be given to you upon arrival. For doctor visits, patients may have 1 support person aged 67 or older with them. For treatment visits, patients cannot have anyone with them due to current Covid guidelines and our immunocompromised population.
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
## 2021-06-25 ENCOUNTER — Other Ambulatory Visit: Payer: Self-pay

## 2021-06-25 ENCOUNTER — Other Ambulatory Visit: Payer: Self-pay | Admitting: Oncology

## 2021-06-25 DIAGNOSIS — C50919 Malignant neoplasm of unspecified site of unspecified female breast: Secondary | ICD-10-CM

## 2021-06-26 ENCOUNTER — Inpatient Hospital Stay: Payer: Medicare Other

## 2021-06-26 ENCOUNTER — Other Ambulatory Visit: Payer: Self-pay

## 2021-06-26 DIAGNOSIS — C50919 Malignant neoplasm of unspecified site of unspecified female breast: Secondary | ICD-10-CM

## 2021-06-26 DIAGNOSIS — Z5111 Encounter for antineoplastic chemotherapy: Secondary | ICD-10-CM | POA: Diagnosis not present

## 2021-06-26 LAB — COMPREHENSIVE METABOLIC PANEL
ALT: 14 U/L (ref 0–44)
AST: 25 U/L (ref 15–41)
Albumin: 3.4 g/dL — ABNORMAL LOW (ref 3.5–5.0)
Alkaline Phosphatase: 55 U/L (ref 38–126)
Anion gap: 10 (ref 5–15)
BUN: 25 mg/dL — ABNORMAL HIGH (ref 8–23)
CO2: 28 mmol/L (ref 22–32)
Calcium: 8.5 mg/dL — ABNORMAL LOW (ref 8.9–10.3)
Chloride: 99 mmol/L (ref 98–111)
Creatinine, Ser: 1.04 mg/dL — ABNORMAL HIGH (ref 0.44–1.00)
GFR, Estimated: 59 mL/min — ABNORMAL LOW (ref >60)
Glucose, Bld: 96 mg/dL (ref 70–99)
Potassium: 4.1 mmol/L (ref 3.5–5.1)
Sodium: 137 mmol/L (ref 135–145)
Total Bilirubin: 0.7 mg/dL (ref 0.3–1.2)
Total Protein: 6.9 g/dL (ref 6.5–8.1)

## 2021-06-26 LAB — CBC WITH DIFFERENTIAL/PLATELET
Abs Immature Granulocytes: 0.15 10*3/uL — ABNORMAL HIGH (ref 0.00–0.07)
Basophils Absolute: 0 10*3/uL (ref 0.0–0.1)
Basophils Relative: 0 %
Eosinophils Absolute: 0 10*3/uL (ref 0.0–0.5)
Eosinophils Relative: 0 %
HCT: 28.6 % — ABNORMAL LOW (ref 36.0–46.0)
Hemoglobin: 9.2 g/dL — ABNORMAL LOW (ref 12.0–15.0)
Immature Granulocytes: 1 %
Lymphocytes Relative: 5 %
Lymphs Abs: 0.7 10*3/uL (ref 0.7–4.0)
MCH: 31.8 pg (ref 26.0–34.0)
MCHC: 32.2 g/dL (ref 30.0–36.0)
MCV: 99 fL (ref 80.0–100.0)
Monocytes Absolute: 0.4 10*3/uL (ref 0.1–1.0)
Monocytes Relative: 3 %
Neutro Abs: 12.7 10*3/uL — ABNORMAL HIGH (ref 1.7–7.7)
Neutrophils Relative %: 91 %
Platelets: 435 10*3/uL — ABNORMAL HIGH (ref 150–400)
RBC: 2.89 MIL/uL — ABNORMAL LOW (ref 3.87–5.11)
RDW: 17.5 % — ABNORMAL HIGH (ref 11.5–15.5)
WBC: 14 10*3/uL — ABNORMAL HIGH (ref 4.0–10.5)
nRBC: 0 % (ref 0.0–0.2)

## 2021-06-26 LAB — SAMPLE TO BLOOD BANK

## 2021-06-26 MED ORDER — HEPARIN SOD (PORK) LOCK FLUSH 100 UNIT/ML IV SOLN
INTRAVENOUS | Status: AC
  Administered 2021-06-26: 500 [IU] via INTRAVENOUS
  Filled 2021-06-26: qty 5

## 2021-06-26 MED ORDER — PEGFILGRASTIM-JMDB 6 MG/0.6ML ~~LOC~~ SOSY
6.0000 mg | PREFILLED_SYRINGE | Freq: Once | SUBCUTANEOUS | Status: AC
  Administered 2021-06-26: 6 mg via SUBCUTANEOUS
  Filled 2021-06-26: qty 0.6

## 2021-06-26 MED ORDER — SODIUM CHLORIDE 0.9% FLUSH
10.0000 mL | Freq: Once | INTRAVENOUS | Status: AC
  Administered 2021-06-26: 10 mL via INTRAVENOUS
  Filled 2021-06-26: qty 10

## 2021-06-26 MED ORDER — HEPARIN SOD (PORK) LOCK FLUSH 100 UNIT/ML IV SOLN
500.0000 [IU] | Freq: Once | INTRAVENOUS | Status: AC
  Filled 2021-06-26: qty 5

## 2021-06-26 NOTE — Progress Notes (Signed)
Hemoglobin 9.2. pt asymtomatic. Per Benjamine Mola RN per Dr. Tasia Catchings no blood today. Pt stable at discharge.

## 2021-07-01 ENCOUNTER — Inpatient Hospital Stay: Payer: Medicare Other

## 2021-07-01 ENCOUNTER — Other Ambulatory Visit: Payer: Self-pay

## 2021-07-01 DIAGNOSIS — D500 Iron deficiency anemia secondary to blood loss (chronic): Secondary | ICD-10-CM

## 2021-07-01 DIAGNOSIS — Z5111 Encounter for antineoplastic chemotherapy: Secondary | ICD-10-CM | POA: Diagnosis not present

## 2021-07-01 DIAGNOSIS — Z95828 Presence of other vascular implants and grafts: Secondary | ICD-10-CM

## 2021-07-01 DIAGNOSIS — C50919 Malignant neoplasm of unspecified site of unspecified female breast: Secondary | ICD-10-CM

## 2021-07-01 LAB — CBC WITH DIFFERENTIAL/PLATELET
Abs Immature Granulocytes: 0.02 10*3/uL (ref 0.00–0.07)
Basophils Absolute: 0 10*3/uL (ref 0.0–0.1)
Basophils Relative: 1 %
Eosinophils Absolute: 0 10*3/uL (ref 0.0–0.5)
Eosinophils Relative: 0 %
HCT: 26.8 % — ABNORMAL LOW (ref 36.0–46.0)
Hemoglobin: 8.4 g/dL — ABNORMAL LOW (ref 12.0–15.0)
Immature Granulocytes: 1 %
Lymphocytes Relative: 37 %
Lymphs Abs: 1.2 10*3/uL (ref 0.7–4.0)
MCH: 31.3 pg (ref 26.0–34.0)
MCHC: 31.3 g/dL (ref 30.0–36.0)
MCV: 100 fL (ref 80.0–100.0)
Monocytes Absolute: 0.7 10*3/uL (ref 0.1–1.0)
Monocytes Relative: 21 %
Neutro Abs: 1.3 10*3/uL — ABNORMAL LOW (ref 1.7–7.7)
Neutrophils Relative %: 40 %
Platelets: 197 10*3/uL (ref 150–400)
RBC: 2.68 MIL/uL — ABNORMAL LOW (ref 3.87–5.11)
RDW: 16.1 % — ABNORMAL HIGH (ref 11.5–15.5)
Smear Review: NORMAL
WBC: 3.2 10*3/uL — ABNORMAL LOW (ref 4.0–10.5)
nRBC: 0 % (ref 0.0–0.2)

## 2021-07-01 LAB — COMPREHENSIVE METABOLIC PANEL
ALT: 17 U/L (ref 0–44)
AST: 17 U/L (ref 15–41)
Albumin: 3.6 g/dL (ref 3.5–5.0)
Alkaline Phosphatase: 72 U/L (ref 38–126)
Anion gap: 6 (ref 5–15)
BUN: 14 mg/dL (ref 8–23)
CO2: 32 mmol/L (ref 22–32)
Calcium: 8.8 mg/dL — ABNORMAL LOW (ref 8.9–10.3)
Chloride: 99 mmol/L (ref 98–111)
Creatinine, Ser: 0.77 mg/dL (ref 0.44–1.00)
GFR, Estimated: >60 mL/min (ref >60)
Glucose, Bld: 114 mg/dL — ABNORMAL HIGH (ref 70–99)
Potassium: 3.9 mmol/L (ref 3.5–5.1)
Sodium: 137 mmol/L (ref 135–145)
Total Bilirubin: 0.4 mg/dL (ref 0.3–1.2)
Total Protein: 7 g/dL (ref 6.5–8.1)

## 2021-07-01 LAB — SAMPLE TO BLOOD BANK

## 2021-07-01 MED ORDER — SODIUM CHLORIDE 0.9% FLUSH
10.0000 mL | Freq: Once | INTRAVENOUS | Status: AC
  Administered 2021-07-01: 10 mL via INTRAVENOUS
  Filled 2021-07-01: qty 10

## 2021-07-01 MED ORDER — HEPARIN SOD (PORK) LOCK FLUSH 100 UNIT/ML IV SOLN
INTRAVENOUS | Status: AC
  Filled 2021-07-01: qty 5

## 2021-07-01 MED ORDER — HEPARIN SOD (PORK) LOCK FLUSH 100 UNIT/ML IV SOLN
500.0000 [IU] | Freq: Once | INTRAVENOUS | Status: AC
  Administered 2021-07-01: 500 [IU] via INTRAVENOUS
  Filled 2021-07-01: qty 5

## 2021-07-01 NOTE — Progress Notes (Signed)
Debra [name] hgb is 8.4 today and she is asymptomatic. Per Dr Tasia Catchings, no blood transfusion needed today. Port deassessed and patient discharged home.

## 2021-07-02 ENCOUNTER — Ambulatory Visit
Admission: RE | Admit: 2021-07-02 | Discharge: 2021-07-02 | Disposition: A | Discharge status: Home / Self Care | Payer: Medicare Other | Source: Ambulatory Visit | Attending: Oncology | Admitting: Oncology

## 2021-07-02 DIAGNOSIS — C50919 Malignant neoplasm of unspecified site of unspecified female breast: Secondary | ICD-10-CM | POA: Diagnosis present

## 2021-07-02 MED ORDER — GADOBUTROL 1 MMOL/ML IV SOLN
7.5000 mL | Freq: Once | INTRAVENOUS | Status: AC | PRN
  Administered 2021-07-02: 7.5 mL via INTRAVENOUS

## 2021-07-09 ENCOUNTER — Other Ambulatory Visit: Payer: Self-pay | Admitting: General Surgery

## 2021-07-09 ENCOUNTER — Ambulatory Visit: Payer: Self-pay | Admitting: General Surgery

## 2021-07-09 DIAGNOSIS — C50211 Malignant neoplasm of upper-inner quadrant of right female breast: Secondary | ICD-10-CM

## 2021-07-09 NOTE — H&P (View-Only) (Signed)
PATIENT PROFILE: Debra [name] is a 65 y.o. female who presents to the Clinic for evaluation of surgical management for right breast cancer.  PCP:  Hande, Vishwanath Handattur, MD  HISTORY OF PRESENT ILLNESS: [name] reports she had her usual screening mammogram.  Screening mammogram shows a distortion of the right breast.  No ultrasonogram findings.  Mammography findings shows a distortion about 1 cm.  Stereotactic core biopsy done.  This showed invasive mammary carcinoma.  The invasive component measured 5 mm.  Markers are ER/PR positive HER2/neu positive.  Bilateral breast MRI shows mass was bigger than 2 cm.  Being HER2 nu positive and a mass of 2 cm it was decided to proceed with neoadjuvant chemotherapy.  She has completed her neoadjuvant chemotherapy.  She tolerated well.  New breast MRI shows mass of 0.9 cm.  This is a good sign of response to chemotherapy.  Patient denies any breast pain, skin changes, palpable masses.  Patient denies any chest pain.  Patient denies any shortness of breath.  Patient continue with home oxygen without any worsening during the chemotherapy.  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       GENERAL REVIEW OF SYSTEMS:   General ROS: negative for - chills, fatigue, fever, weight gain or weight loss Allergy and Immunology ROS: negative for - hives  Hematological and Lymphatic ROS: negative for -  bleeding problems or bruising, negative for palpable nodes Endocrine ROS: negative for - heat or cold intolerance, hair changes Respiratory ROS: negative for - cough, shortness of breath or wheezing Cardiovascular ROS: no chest pain or palpitations GI ROS: negative for nausea, vomiting, abdominal pain, diarrhea, constipation Musculoskeletal ROS: negative for - joint swelling or muscle pain Neurological ROS: negative for - confusion, syncope Dermatological ROS: negative for pruritus and rash Psychiatric: negative for anxiety, depression, difficulty sleeping and memory loss  MEDICATIONS: Current Outpatient Medications  Medication Sig Dispense Refill   acetaminophen (TYLENOL) 500 MG tablet Take by mouth as needed     ADVAIR HFA 115-21 mcg/actuation inhaler INHALE 1 PUFF BY MOUTH EVERY 12 HOURS AS DIRECTED RINSE MOUTH AFTER EACH USE 12 g 3   albuterol (PROVENTIL) 2.5 mg /3 mL (0.083 %) nebulizer solution Take 3 mLs (2.5 mg total) by nebulization every 6 (six) hours as needed for Wheezing 360 mL 11   albuterol 90 mcg/actuation inhaler INHALE 2 INHALATIONS INTO THE LUNGS EVERY 6 (SIX) HOURS AS NEEDED 6.7 each 5   alendronate (FOSAMAX) 70 MG tablet TAKE 1 TABLET (70 MG TOTAL) BY MOUTH EVERY 7 (SEVEN) DAYS FOR 180 DAYS TAKE WITH A FULL GLASS OF WATER. DO NOT LIE DOWN FOR THE NEXT 30 MIN. 12 tablet 1   atorvastatin (LIPITOR) 20 MG tablet Take 1 tablet (20 mg total) by mouth once daily 30 tablet 2   baclofen (LIORESAL) 10 MG tablet Take 1 tablet (10 mg total) by   mouth 3 (three) times daily     citalopram (CELEXA) 10 MG tablet TAKE 1 TABLET BY MOUTH EVERY DAY 90 tablet 1   cyanocobalamin, vitamin B-12, 1,000 mcg Cap TAKE 1 CAPSULE BY MOUTH ONCE DAILY 90 capsule 1   DULoxetine (CYMBALTA) 30 MG DR capsule Take 1 capsule (30 mg total) by mouth once daily 30 capsule 5   ELIQUIS 5 mg tablet Take 5 mg by mouth 2 (two) times daily     fluticasone-umeclidinium-vilanterol (TRELEGY ELLIPTA) 100-62.5-25 mcg  inhaler Inhale 1 inhalation into the lungs once daily DO NOT USE ADVAIR OR SPIRIVA 1 each 11   FUROsemide (LASIX) 20 MG tablet Take by mouth as needed     IRON POLYSACCHARIDES 150 mg iron capsule TAKE ONE CAPSULE BY MOUTH DAILY 90 capsule 1   L. acidophilus-L. rhamnosus (PROBIOTIC) 15 billion cell Cap Take 1 capsule by mouth once daily     lidocaine-prilocaine (EMLA) cream Apply topically as needed     loperamide (IMODIUM) 2 mg capsule Take by mouth as needed     LYRICA 150 mg capsule Take 1 capsule (150 mg total) by mouth 2 (two) times daily 60 capsule 3   melatonin 5 mg Cap Take by mouth once daily     NARCAN 4 mg/actuation For suspected opioid overdose, support neck, allow head to tilt back, use 1 spray in 1 nostril, if needed, wait 2-3 minutes, spray in other  0   omeprazole (PRILOSEC) 20 MG DR capsule Take 1 capsule (20 mg total) by mouth once daily     ondansetron (ZOFRAN) 8 MG tablet Take by mouth as needed     ondansetron (ZOFRAN-ODT) 4 MG disintegrating tablet TAKE 1 TABLET (4 MG TOTAL) BY MOUTH 2 (TWO) TIMES DAILY AS NEEDED FOR NAUSEA 60 tablet 1   oxyCODONE-acetaminophen (PERCOCET) 7.5-325 mg tablet TAKE 1 TABLET BY MOUTH 5 TIMES A DAY AS NEEDED FOR PAIN     potassium chloride (KLOR-CON) 10 MEQ ER tablet TAKE 1 TABLET BY MOUTH ONCE DAILY 90 tablet 1   potassium chloride (MICRO-K) 10 MEQ ER capsule TAKE 1 TABLET BY MOUTH DAILY 30 capsule 11   prochlorperazine (COMPAZINE) 10 MG tablet as needed     promethazine (PHENERGAN) 25 MG tablet Take 1 tablet (25 mg total) by mouth every 6 (six) hours as needed     SPIRIVA WITH HANDIHALER 18 mcg inhalation capsule PLACE ONE CAPSULE INTO INHALER AND INHALE ONCE DAILY AS DIRECTED 30 capsule 5   DALIRESP 500 mcg tablet TAKE 1 TABLET (0.5 MG TOTAL) BY MOUTH ONCE DAILY (Patient not taking: Reported on 01/18/2021) 90 tablet 3   rOPINIRole (REQUIP) 0.25 MG tablet Take 0.25 mg by mouth nightly (Patient not taking: Reported on 07/09/2021)     tiZANidine  (ZANAFLEX) 4 MG tablet Take 1 tablet (4 mg total) by mouth 3 (three) times daily as needed for up to 180 days (Patient not taking: Reported on 07/09/2021) 270 tablet 1   No current facility-administered medications for this visit.    ALLERGIES: Strawberry  PAST MEDICAL HISTORY: Past Medical History:  Diagnosis Date   COPD (chronic obstructive pulmonary disease) (CMS-HCC)    Depression    Migraine     PAST SURGICAL HISTORY: Past Surgical History:  Procedure Laterality Date   Right hip pinning Right 04/26/14     FAMILY HISTORY: Family History  Problem Relation Age of Onset   High blood pressure (Hypertension) Mother    Asthma Mother    Myocardial Infarction (Heart attack)  Mother    Stroke Mother    Cancer Mother    No Known Problems Father      SOCIAL HISTORY: Social History   Socioeconomic History   Marital status: Married  Tobacco Use   Smoking status: Former    Packs/day: 1.00    Years: 40.00    Pack years: 40.00    Types: Cigarettes    Quit date: 11/16/2013    Years since quitting: 7.6   Smokeless tobacco: Never  Vaping Use   Vaping Use: Never used  Substance and Sexual Activity   Alcohol use: No   Drug use: No   Sexual activity: Defer    PHYSICAL EXAM: Vitals:   07/09/21 1358  BP: 114/71  Pulse: (!) 126   Body mass index is 31.07 kg/m. Weight: 82.1 kg (181 lb)   GENERAL: Alert, active, oriented x3  HEENT: Pupils equal reactive to light. Extraocular movements are intact. Sclera clear. Palpebral conjunctiva normal red color.Pharynx clear.  NECK: Supple with no palpable mass and no adenopathy.  LUNGS: Sound clear with no rales rhonchi or wheezes.  HEART: Regular rhythm S1 and S2 without murmur.  BREAST: breasts appear normal, no suspicious masses, no skin or nipple changes or axillary nodes.  ABDOMEN: Soft and depressible, nontender with no palpable mass, no hepatomegaly.  EXTREMITIES: Well-developed well-nourished symmetrical with no  dependent edema.  NEUROLOGICAL: Awake alert oriented, facial expression symmetrical, moving all extremities.  REVIEW OF DATA: I have reviewed the following data today: No visits with results within 3 Month(s) from this visit.  Latest known visit with results is:  Appointment on 12/11/2020  Component Date Value   WBC (White Blood Cell Co* 12/11/2020 7.0    RBC (Red Blood Cell Coun* 12/11/2020 3.61 (L)    Hemoglobin 12/11/2020 10.5 (L)    Hematocrit 12/11/2020 33.5 (L)    MCV (Mean Corpuscular Vo* 12/11/2020 92.8    MCH (Mean Corpuscular He* 12/11/2020 29.1    MCHC (Mean Corpuscular H* 12/11/2020 31.3 (L)    Platelet Count 12/11/2020 328    RDW-CV (Red Cell Distrib* 12/11/2020 13.8    MPV (Mean Platelet Volum* 12/11/2020 9.2 (L)    Neutrophils 12/11/2020 3.85    Lymphocytes 12/11/2020 2.24    Monocytes 12/11/2020 0.54    Eosinophils 12/11/2020 0.33    Basophils 12/11/2020 0.05    Neutrophil % 12/11/2020 54.9    Lymphocyte % 12/11/2020 31.9    Monocyte % 12/11/2020 7.7    Eosinophil % 12/11/2020 4.7    Basophil% 12/11/2020 0.7    Immature Granulocyte % 12/11/2020 0.1    Immature Granulocyte Cou* 12/11/2020 0.01    Glucose 12/11/2020 128 (H)    Sodium 12/11/2020 139    Potassium 12/11/2020 4.0    Chloride 12/11/2020 100    Carbon Dioxide (CO2) 12/11/2020 36.3 (H)    Urea Nitrogen (BUN) 12/11/2020 8    Creatinine 12/11/2020 0.9    Glomerular Filtration Ra* 12/11/2020 63    Calcium 12/11/2020 9.1    AST  12/11/2020 11    ALT  12/11/2020 9    Alk Phos (alkaline Phosp* 12/11/2020 55    Albumin 12/11/2020 3.6    Bilirubin, Total 12/11/2020 0.3    Protein, Total 12/11/2020 6.5    A/G Ratio 12/11/2020 1.2    Cholesterol, Total 12/11/2020 192    Triglyceride 12/11/2020 215 (H)    HDL (High Density Lipopr* 12/11/2020 42.7    LDL Calculated 12/11/2020 106    VLDL Cholesterol 12/11/2020 43  Cholesterol/HDL Ratio 12/11/2020 4.5    Urine Albumin, Random 12/11/2020 16     Thyroid Stimulating Horm* 12/11/2020 1.306    Hemoglobin A1C 12/11/2020 5.8 (H)    Average Blood Glucose (C* 12/11/2020 120    Vitamin B12 12/11/2020 573    Ferritin 12/11/2020 33      ASSESSMENT: Ms. Tarrant is a 65 y.o. female presenting for consultation for surgical management of right breast cancer.    Patient was oriented again about the pathology results.  She was also oriented about new findings on MRI which shows improvement of the size of the mass.  After completion of neoadjuvant chemotherapy surgical alternatives were discussed with patient including partial vs total mastectomy. Surgical technique and post operative care was discussed with patient. Risk of surgery was discussed with patient including but not limited to: wound infection, seroma, hematoma, brachial plexopathy, mondor's disease (thrombosis of small veins of breast), chronic wound pain, breast lymphedema, altered sensation to the nipple and cosmesis among others.   Malignant neoplasm of upper-inner quadrant of right breast in female, estrogen receptor positive (CMS-HCC) [C50.211, Z17.0]  PLAN: 1.  Right breast radiofrequency tag partial mastectomy with right axillary sentinel node biopsy (19301, [id number]) 2.  CBC, CMP done 3.  Pulmonology and cardiology clearance 4.  Hold Eliquis 48 hours before surgery 5.  Contact us if you have any concern  Patient verbalized understanding, all questions were answered, and were agreeable with the plan outlined above.     Herbert Pun, MD

## 2021-07-09 NOTE — H&P (Signed)
PATIENT PROFILE: Debra [name] is a 65 y.o. female who presents to the Clinic for evaluation of surgical management for right breast cancer.  PCP:  Azzie Glatter, MD  HISTORY OF PRESENT ILLNESS: Debra [name] reports she had her usual screening mammogram.  Screening mammogram shows a distortion of the right breast.  No ultrasonogram findings.  Mammography findings shows a distortion about 1 cm.  Stereotactic core biopsy done.  This showed invasive mammary carcinoma.  The invasive component measured 5 mm.  Markers are ER/PR positive HER2/neu positive.  Bilateral breast MRI shows mass was bigger than 2 cm.  Being HER2 nu positive and a mass of 2 cm it was decided to proceed with neoadjuvant chemotherapy.  She has completed her neoadjuvant chemotherapy.  She tolerated well.  New breast MRI shows mass of 0.9 cm.  This is a good sign of response to chemotherapy.  Patient denies any breast pain, skin changes, palpable masses.  Patient denies any chest pain.  Patient denies any shortness of breath.  Patient continue with home oxygen without any worsening during the chemotherapy.  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       GENERAL REVIEW OF SYSTEMS:   General ROS: negative for - chills, fatigue, fever, weight gain or weight loss Allergy and Immunology ROS: negative for - hives  Hematological and Lymphatic ROS: negative for -  bleeding problems or bruising, negative for palpable nodes Endocrine ROS: negative for - heat or cold intolerance, hair changes Respiratory ROS: negative for - cough, shortness of breath or wheezing Cardiovascular ROS: no chest pain or palpitations GI ROS: negative for nausea, vomiting, abdominal pain, diarrhea, constipation Musculoskeletal ROS: negative for - joint swelling or muscle pain Neurological ROS: negative for - confusion, syncope Dermatological ROS: negative for pruritus and rash Psychiatric: negative for anxiety, depression, difficulty sleeping and memory loss  MEDICATIONS: Current Outpatient Medications  Medication Sig Dispense Refill   acetaminophen (TYLENOL) 500 MG tablet Take by mouth as needed     ADVAIR HFA 115-21 mcg/actuation inhaler INHALE 1 PUFF BY MOUTH EVERY 12 HOURS AS DIRECTED RINSE MOUTH AFTER EACH USE 12 g 3   albuterol (PROVENTIL) 2.5 mg /3 mL (0.083 %) nebulizer solution Take 3 mLs (2.5 mg total) by nebulization every 6 (six) hours as needed for Wheezing 360 mL 11   albuterol 90 mcg/actuation inhaler INHALE 2 INHALATIONS INTO THE LUNGS EVERY 6 (SIX) HOURS AS NEEDED 6.7 each 5   alendronate (FOSAMAX) 70 MG tablet TAKE 1 TABLET (70 MG TOTAL) BY MOUTH EVERY 7 (SEVEN) DAYS FOR 180 DAYS TAKE WITH A FULL GLASS OF WATER. DO NOT LIE DOWN FOR THE NEXT 30 MIN. 12 tablet 1   atorvastatin (LIPITOR) 20 MG tablet Take 1 tablet (20 mg total) by mouth once daily 30 tablet 2   baclofen (LIORESAL) 10 MG tablet Take 1 tablet (10 mg total) by  mouth 3 (three) times daily     citalopram (CELEXA) 10 MG tablet TAKE 1 TABLET BY MOUTH EVERY DAY 90 tablet 1   cyanocobalamin, vitamin B-12, 1,000 mcg Cap TAKE 1 CAPSULE BY MOUTH ONCE DAILY 90 capsule 1   DULoxetine (CYMBALTA) 30 MG DR capsule Take 1 capsule (30 mg total) by mouth once daily 30 capsule 5   ELIQUIS 5 mg tablet Take 5 mg by mouth 2 (two) times daily     fluticasone-umeclidinium-vilanterol (TRELEGY ELLIPTA) 100-62.5-25 mcg  inhaler Inhale 1 inhalation into the lungs once daily DO NOT USE ADVAIR OR SPIRIVA 1 each 11   FUROsemide (LASIX) 20 MG tablet Take by mouth as needed     IRON POLYSACCHARIDES 150 mg iron capsule TAKE ONE CAPSULE BY MOUTH DAILY 90 capsule 1   L. acidophilus-L. rhamnosus (PROBIOTIC) 15 billion cell Cap Take 1 capsule by mouth once daily     lidocaine-prilocaine (EMLA) cream Apply topically as needed     loperamide (IMODIUM) 2 mg capsule Take by mouth as needed     LYRICA 150 mg capsule Take 1 capsule (150 mg total) by mouth 2 (two) times daily 60 capsule 3   melatonin 5 mg Cap Take by mouth once daily     NARCAN 4 mg/actuation For suspected opioid overdose, support neck, allow head to tilt back, use 1 spray in 1 nostril, if needed, wait 2-3 minutes, spray in other  0   omeprazole (PRILOSEC) 20 MG DR capsule Take 1 capsule (20 mg total) by mouth once daily     ondansetron (ZOFRAN) 8 MG tablet Take by mouth as needed     ondansetron (ZOFRAN-ODT) 4 MG disintegrating tablet TAKE 1 TABLET (4 MG TOTAL) BY MOUTH 2 (TWO) TIMES DAILY AS NEEDED FOR NAUSEA 60 tablet 1   oxyCODONE-acetaminophen (PERCOCET) 7.5-325 mg tablet TAKE 1 TABLET BY MOUTH 5 TIMES A DAY AS NEEDED FOR PAIN     potassium chloride (KLOR-CON) 10 MEQ ER tablet TAKE 1 TABLET BY MOUTH ONCE DAILY 90 tablet 1   potassium chloride (MICRO-K) 10 MEQ ER capsule TAKE 1 TABLET BY MOUTH DAILY 30 capsule 11   prochlorperazine (COMPAZINE) 10 MG tablet as needed     promethazine (PHENERGAN) 25 MG tablet Take 1 tablet (25 mg total) by mouth every 6 (six) hours as needed     SPIRIVA WITH HANDIHALER 18 mcg inhalation capsule PLACE ONE CAPSULE INTO INHALER AND INHALE ONCE DAILY AS DIRECTED 30 capsule 5   DALIRESP 500 mcg tablet TAKE 1 TABLET (0.5 MG TOTAL) BY MOUTH ONCE DAILY (Patient not taking: Reported on 01/18/2021) 90 tablet 3   rOPINIRole (REQUIP) 0.25 MG tablet Take 0.25 mg by mouth nightly (Patient not taking: Reported on 07/09/2021)     tiZANidine  (ZANAFLEX) 4 MG tablet Take 1 tablet (4 mg total) by mouth 3 (three) times daily as needed for up to 180 days (Patient not taking: Reported on 07/09/2021) 270 tablet 1   No current facility-administered medications for this visit.    ALLERGIES: Strawberry  PAST MEDICAL HISTORY: Past Medical History:  Diagnosis Date   COPD (chronic obstructive pulmonary disease) (CMS-HCC)    Depression    Migraine     PAST SURGICAL HISTORY: Past Surgical History:  Procedure Laterality Date   Right hip pinning Right 04/26/14     FAMILY HISTORY: Family History  Problem Relation Age of Onset   High blood pressure (Hypertension) Mother    Asthma Mother    Myocardial Infarction (Heart attack)  Mother    Stroke Mother    Cancer Mother    No Known Problems Father      SOCIAL HISTORY: Social History   Socioeconomic History   Marital status: Married  Tobacco Use   Smoking status: Former    Packs/day: 1.00    Years: 40.00    Pack years: 40.00    Types: Cigarettes    Quit date: 11/16/2013    Years since quitting: 7.6   Smokeless tobacco: Never  Vaping Use   Vaping Use: Never used  Substance and Sexual Activity   Alcohol use: No   Drug use: No   Sexual activity: Defer    PHYSICAL EXAM: Vitals:   07/09/21 1358  BP: 114/71  Pulse: (!) 126   Body mass index is 31.07 kg/m. Weight: 82.1 kg (181 lb)   GENERAL: Alert, active, oriented x3  HEENT: Pupils equal reactive to light. Extraocular movements are intact. Sclera clear. Palpebral conjunctiva normal red color.Pharynx clear.  NECK: Supple with no palpable mass and no adenopathy.  LUNGS: Sound clear with no rales rhonchi or wheezes.  HEART: Regular rhythm S1 and S2 without murmur.  BREAST: breasts appear normal, no suspicious masses, no skin or nipple changes or axillary nodes.  ABDOMEN: Soft and depressible, nontender with no palpable mass, no hepatomegaly.  EXTREMITIES: Well-developed well-nourished symmetrical with no  dependent edema.  NEUROLOGICAL: Awake alert oriented, facial expression symmetrical, moving all extremities.  REVIEW OF DATA: I have reviewed the following data today: No visits with results within 3 Month(s) from this visit.  Latest known visit with results is:  Appointment on 12/11/2020  Component Date Value   WBC (White Blood Cell Co* 12/11/2020 7.0    RBC (Red Blood Cell Coun* 12/11/2020 3.61 (L)    Hemoglobin 12/11/2020 10.5 (L)    Hematocrit 12/11/2020 33.5 (L)    MCV (Mean Corpuscular Vo* 12/11/2020 92.8    MCH (Mean Corpuscular He* 12/11/2020 29.1    MCHC (Mean Corpuscular H* 12/11/2020 31.3 (L)    Platelet Count 12/11/2020 328    RDW-CV (Red Cell Distrib* 12/11/2020 13.8    MPV (Mean Platelet Volum* 12/11/2020 9.2 (L)    Neutrophils 12/11/2020 3.85    Lymphocytes 12/11/2020 2.24    Monocytes 12/11/2020 0.54    Eosinophils 12/11/2020 0.33    Basophils 12/11/2020 0.05    Neutrophil % 12/11/2020 54.9    Lymphocyte % 12/11/2020 31.9    Monocyte % 12/11/2020 7.7    Eosinophil % 12/11/2020 4.7    Basophil% 12/11/2020 0.7    Immature Granulocyte % 12/11/2020 0.1    Immature Granulocyte Cou* 12/11/2020 0.01    Glucose 12/11/2020 128 (H)    Sodium 12/11/2020 139    Potassium 12/11/2020 4.0    Chloride 12/11/2020 100    Carbon Dioxide (CO2) 12/11/2020 36.3 (H)    Urea Nitrogen (BUN) 12/11/2020 8    Creatinine 12/11/2020 0.9    Glomerular Filtration Ra* 12/11/2020 63    Calcium 12/11/2020 9.1    AST  12/11/2020 11    ALT  12/11/2020 9    Alk Phos (alkaline Phosp* 12/11/2020 55    Albumin 12/11/2020 3.6    Bilirubin, Total 12/11/2020 0.3    Protein, Total 12/11/2020 6.5    A/G Ratio 12/11/2020 1.2    Cholesterol, Total 12/11/2020 192    Triglyceride 12/11/2020 215 (H)    HDL (High Density Lipopr* 12/11/2020 42.7    LDL Calculated 12/11/2020 106    VLDL Cholesterol 12/11/2020 43  Cholesterol/HDL Ratio 12/11/2020 4.5    Urine Albumin, Random 12/11/2020 16     Thyroid Stimulating Horm* 12/11/2020 1.306    Hemoglobin A1C 12/11/2020 5.8 (H)    Average Blood Glucose (C* 12/11/2020 120    Vitamin B12 12/11/2020 573    Ferritin 12/11/2020 33      ASSESSMENT: Debra [name] is a 65 y.o. female presenting for consultation for surgical management of right breast cancer.    Patient was oriented again about the pathology results.  She was also oriented about new findings on MRI which shows improvement of the size of the mass.  After completion of neoadjuvant chemotherapy surgical alternatives were discussed with patient including partial vs total mastectomy. Surgical technique and post operative care was discussed with patient. Risk of surgery was discussed with patient including but not limited to: wound infection, seroma, hematoma, brachial plexopathy, mondor's disease (thrombosis of small veins of breast), chronic wound pain, breast lymphedema, altered sensation to the nipple and cosmesis among others.   Malignant neoplasm of upper-inner quadrant of right breast in female, estrogen receptor positive (CMS-HCC) [C50.211, Z17.0]  PLAN: 1.  Right breast radiofrequency tag partial mastectomy with right axillary sentinel node biopsy ([id number], [id number]) 2.  CBC, CMP done 3.  Pulmonology and cardiology clearance 4.  Hold Eliquis 48 hours before surgery 5.  Contact us if you have any concern  Patient verbalized understanding, all questions were answered, and were agreeable with the plan outlined above.     Herbert Pun, MD

## 2021-07-10 ENCOUNTER — Other Ambulatory Visit: Payer: Self-pay | Admitting: General Surgery

## 2021-07-10 ENCOUNTER — Telehealth: Payer: Self-pay | Admitting: Cardiology

## 2021-07-10 ENCOUNTER — Telehealth: Payer: Self-pay

## 2021-07-10 DIAGNOSIS — C50211 Malignant neoplasm of upper-inner quadrant of right female breast: Secondary | ICD-10-CM

## 2021-07-10 DIAGNOSIS — Z170 Estrogen receptor positive status [ER+]: Secondary | ICD-10-CM

## 2021-07-10 NOTE — Telephone Encounter (Signed)
Patient schedule for [facility] (partial mastectomy with axillary sentinel lymph node biopsy) on 12/5. Please schedule patient for MD only, 2 weeks after surgery and notify pt of appt.

## 2021-07-10 NOTE — Telephone Encounter (Signed)
   [facility] Pre-operative Risk Assessment    Patient Name: Debra [name]  DOB: 12-22-1955 MRN: [id number]  HEARTCARE STAFF:  - IMPORTANT!!!!!! Under Visit Info/Reason for Call, type in Other and utilize the format Clearance MM/DD/YY or Clearance TBD. Do not use dashes or single digits. - Please review there is not already an duplicate clearance open for this procedure. - If request is for dental extraction, please clarify the # of teeth to be extracted. - If the patient is currently at the dentist's office, call Pre-Op Callback Staff (MA/nurse) to input urgent request.  - If the patient is not currently in the dentist office, please route to the Pre-Op pool.  Request for surgical clearance:  What type of surgery is being performed? Partial mastectomy w/ tag and sentinel node bx  When is this surgery scheduled? 07/22/21  What type of clearance is required (medical clearance vs. Pharmacy clearance to hold med vs. Both)? noth  Are there any medications that need to be held prior to surgery and how long? Eliquis x 2 days   Practice name and name of physician performing surgery? [facility]   What is the office phone number? [phone]   7.   What is the office fax number? [phone]  8.   Anesthesia type (None, local, MAC, general) ? Not noted    Clarisse Gouge 07/10/2021, 2:55 PM  _________________________________________________________________   (provider comments below)

## 2021-07-10 NOTE — Telephone Encounter (Signed)
Left message to call back and ask to speak with pre-op team.  Darreld Mclean, PA-C 07/10/2021 4:31 PM

## 2021-07-10 NOTE — Telephone Encounter (Signed)
Patient with diagnosis of afib on Eliquis for anticoagulation.    Procedure: Partial mastectomy w/ tag and sentinel node bx Date of procedure: 07/22/21  CHA2DS2-VASc Score = 2  This indicates a 2.2% annual risk of stroke. The patient's score is based upon: CHF History: 0 HTN History: 0 Diabetes History: 0 Stroke History: 0 Vascular Disease History: 0 Age Score: 1 Gender Score: 1   HTN pulls on pt's PMH but she doesn't take BP meds and readings are consistently normal, will not count.  CrCl 26mL/min using adjusted body weight Platelet count 197K  Per office protocol, patient can hold Eliquis for 2 days prior to procedure as requested.

## 2021-07-10 NOTE — Telephone Encounter (Signed)
    Patient Name: Debra [name]  DOB: September 25, 1955 MRN: [id number]  Primary Cardiologist: [name], MD  Chart reviewed as part of pre-operative protocol coverage. Patient was last seen by Dr. [name] in 01/2021. She was contacted today for further pre-op evaluation and reported doing well from a cardiac standpoint since her last visit. She denied any chest pain, shortness of breath, acute CHF symptoms, palpitations, or syncope. She is able to complete >4.0 METS of activity without any problems. Given past medical history and time since last visit, based on ACC/AHA guidelines, JAIDAN STACHNIK would be at acceptable risk for the planned procedure without further cardiovascular testing.   Per Pharmacy and office protocol, patient can hold Eliquis for 2 days prior to procedure. Please restart this as soon as safely possible following procedure.   I will route this recommendation to the requesting party via Epic fax function and remove from pre-op pool.  Please call with questions.  Darreld Mclean, PA-C 07/10/2021, 4:40 PM

## 2021-07-10 NOTE — Telephone Encounter (Signed)
Pharmacy, can you please comment on how long Eliquis can be held for upcoming procedure?  Thank you! 

## 2021-07-11 ENCOUNTER — Other Ambulatory Visit: Payer: Self-pay | Admitting: Oncology

## 2021-07-15 ENCOUNTER — Encounter: Payer: Self-pay | Admitting: Oncology

## 2021-07-18 ENCOUNTER — Other Ambulatory Visit: Payer: Self-pay | Admitting: General Surgery

## 2021-07-18 ENCOUNTER — Other Ambulatory Visit
Admission: RE | Admit: 2021-07-18 | Discharge: 2021-07-18 | Disposition: A | Discharge status: Home / Self Care | Payer: Medicare Other | Source: Ambulatory Visit | Attending: General Surgery | Admitting: General Surgery

## 2021-07-18 ENCOUNTER — Encounter
Admission: RE | Admit: 2021-07-18 | Discharge: 2021-07-18 | Disposition: A | Discharge status: Home / Self Care | Payer: Medicare Other | Source: Ambulatory Visit | Attending: General Surgery | Admitting: General Surgery

## 2021-07-18 ENCOUNTER — Encounter: Payer: Self-pay | Admitting: General Surgery

## 2021-07-18 ENCOUNTER — Ambulatory Visit
Admission: RE | Admit: 2021-07-18 | Discharge: 2021-07-18 | Disposition: A | Discharge status: Home / Self Care | Payer: Medicare Other | Source: Ambulatory Visit | Attending: General Surgery | Admitting: General Surgery

## 2021-07-18 ENCOUNTER — Other Ambulatory Visit: Payer: Self-pay

## 2021-07-18 VITALS — Ht 65.0 in | Wt 184.0 lb

## 2021-07-18 DIAGNOSIS — C50211 Malignant neoplasm of upper-inner quadrant of right female breast: Secondary | ICD-10-CM | POA: Diagnosis present

## 2021-07-18 DIAGNOSIS — D649 Anemia, unspecified: Secondary | ICD-10-CM

## 2021-07-18 DIAGNOSIS — Z01812 Encounter for preprocedural laboratory examination: Secondary | ICD-10-CM

## 2021-07-18 DIAGNOSIS — Z170 Estrogen receptor positive status [ER+]: Secondary | ICD-10-CM

## 2021-07-18 HISTORY — PX: BREAST LUMPECTOMY: SHX2

## 2021-07-18 LAB — CBC
HCT: 25.7 % — ABNORMAL LOW (ref 36.0–46.0)
Hemoglobin: 7.8 g/dL — ABNORMAL LOW (ref 12.0–15.0)
MCH: 31.5 pg (ref 26.0–34.0)
MCHC: 30.4 g/dL (ref 30.0–36.0)
MCV: 103.6 fL — ABNORMAL HIGH (ref 80.0–100.0)
Platelets: 428 10*3/uL — ABNORMAL HIGH (ref 150–400)
RBC: 2.48 MIL/uL — ABNORMAL LOW (ref 3.87–5.11)
RDW: 16.5 % — ABNORMAL HIGH (ref 11.5–15.5)
WBC: 8.8 10*3/uL (ref 4.0–10.5)
nRBC: 0 % (ref 0.0–0.2)
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
## 2021-07-18 NOTE — Patient Instructions (Addendum)
Your procedure is scheduled on: Monday July 22, 2021. Report to Radiology first at 8:30 am as instructed inside [facility].   Remember: Instructions that are not followed completely may result in serious medical risk,  up to and including death, or upon the discretion of your surgeon and anesthesiologist your  surgery may need to be rescheduled.     _X__ 1. Do not eat food or drink fluids after midnight the night before your procedure.                 No chewing gum or hard candies.   __X__2.  On the morning of surgery brush your teeth with toothpaste and water, you                may rinse your mouth with mouthwash if you wish.  Do not swallow any toothpaste or mouthwash.     _X__ 3.  No Alcohol for 24 hours before or after surgery.   _X__ 4.  Do Not Smoke or use e-cigarettes For 24 Hours Prior to Your Surgery.                 Do not use any chewable tobacco products for at least 6 hours prior to                 Surgery.  _X__  5.  Do not use any recreational drugs (marijuana, cocaine, heroin, ecstasy, MDMA or other)                For at least one week prior to your surgery.  Combination of these drugs with anesthesia                May have life threatening results.  __X__ 6.  Notify your doctor if there is any change in your medical condition      (cold, fever, infections).     Do not wear jewelry, make-up, hairpins, clips or nail polish. Do not wear lotions, powders, perfumes or deodorant. Do not shave 48 hours prior to surgery. Men may shave face and neck. Do not bring valuables to the hospital.    [facility] is not responsible for any belongings or valuables.  Contacts, dentures or bridgework may not be worn into surgery. Leave your suitcase in the car. After surgery it may be brought to your room. For patients admitted to the hospital, discharge time is determined by your treatment team.   Patients discharged the day of surgery will not be  allowed to drive home.   Make arrangements for someone to be with you for the first 24 hours of your Same Day Discharge.   __X__ Take these medicines the morning of surgery with A SIP OF WATER:    1. DULoxetine (CYMBALTA) 30 MG  2. omeprazole (PRILOSEC) 20 MG   3. pregabalin (LYRICA) 150 MG capsule  4. oxyCODONE-acetaminophen (PERCOCET) 7.5-325 MG tablet  5.  6.  ____ Fleet Enema (as directed)   __X__ Use CHG Soap (or wipes) as directed  ____ Use Benzoyl Peroxide Gel as instructed  __X__ Use inhalers on the day of surgery  TRELEGY ELLIPTA 100-62.5-25 MCG/INH AEPB  tiotropium (SPIRIVA) 18 MCG inhalation  ____ Stop metformin 2 days prior to surgery    ____ Take 1/2 of usual insulin dose the night before surgery. No insulin the morning          of surgery.   __X__ Stop apixaban (ELIQUIS) 5 MG TABS 2 days prior to  your surgery (last dose will be Friday 07/19/21).   __X__ One Week prior to surgery- Stop Anti-inflammatories such as Ibuprofen, Aleve, Advil, Motrin, meloxicam (MOBIC), diclofenac, etodolac, ketorolac, Toradol, Daypro, piroxicam, Goody's or BC powders. OK TO USE TYLENOL IF NEEDED   __X__ Stop ALL supplements until after surgery. Melatonin 5 MG    ____ Bring C-Pap to the hospital.    If you have any questions regarding your pre-procedure instructions,  Please call Pre-admit Testing at [phone].

## 2021-07-19 NOTE — Anesthesia Preprocedure Evaluation (Addendum)
Anesthesia Evaluation  Patient identified by MRN, date of birth, ID band Patient awake    Reviewed: Allergy & Precautions, NPO status , Patient's Chart, lab work & pertinent test results  History of Anesthesia Complications Negative for: history of anesthetic complications  Airway Mallampati: II  TM Distance: >3 FB Neck ROM: Full    Dental  (+) Edentulous Upper, Edentulous Lower   Pulmonary shortness of breath and with exertion, neg sleep apnea, COPD,  COPD inhaler and oxygen dependent, former smoker (30 pack years),  chronic dyspnea, no worse. limited exercise tolerance on portable oxygen 2-3L x 24 hours, occasional cough  2L [place]  breath sounds clear to auscultation- rhonchi (-) wheezing      Cardiovascular Exercise Tolerance: Poor METS: 3 - Mets hypertension, Pt. on medications (-) CAD, (-) Past MI, (-) Cardiac Stents and (-) CABG + dysrhythmias Atrial Fibrillation  Rhythm:Irregular Rate:Normal + Peripheral Edema (Right lower leg)- Systolic murmurs and - Diastolic murmurs ECHO 4/58/5929: Normal     Neuro/Psych neg Seizures PSYCHIATRIC DISORDERS Anxiety Depression negative neurological ROS     GI/Hepatic Neg liver ROS, GERD  Medicated and Controlled,  Endo/Other  negative endocrine ROSneg diabetes  Renal/GU negative Renal ROS     Musculoskeletal  (+) Arthritis ,   Abdominal (+) + obese,   Peds  Hematology  (+) Blood dyscrasia (Hemoglobin 7.8), anemia ,   Anesthesia Other Findings 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 S/P neoadjuvant chemotherapy  Past Medical History: No date: Anxiety No date: Arthritis No date: Carotid bruit     Comment:  L --nl doppler 5/09- and again 5/13 with 0-39% stenosis               bilat No date: Constipation No date: COPD (chronic obstructive pulmonary disease) (HCC) No date: Dysrhythmia No date: Fatigue 2015: Fracture of  femoral neck, right (HCC) No date: GI (gastrointestinal bleed)     Comment:  Almando Brawley No date: Hepatitis No date: Hyperlipidemia No date: Hypertension No date: Left arm pain No date: Leg pain     Comment:  Chronic pain R leg from injury No date: Osteopenia 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 No date: Tobacco abuse   Reproductive/Obstetrics                            Anesthesia Physical  Anesthesia Plan  ASA: 3  Anesthesia Plan: General   Post-op Pain Management:    Induction: Intravenous  PONV Risk Score and Plan: 3 and Ondansetron, Dexamethasone and Midazolam  Airway Management Planned: Oral ETT  Additional Equipment:   Intra-op Plan:   Post-operative Plan: Extubation in OR  Informed Consent: I have reviewed the patients History and Physical, chart, labs and discussed the procedure including the risks, benefits and alternatives for the proposed anesthesia with the patient or authorized representative who has indicated his/her understanding and acceptance.     Dental advisory given  Plan Discussed with: CRNA and Anesthesiologist  Anesthesia Plan Comments:        Anesthesia Quick Evaluation

## 2021-07-22 ENCOUNTER — Encounter
Admission: RE | Disposition: A | Discharge status: Home / Self Care | Payer: Self-pay | Source: Home / Self Care | Attending: General Surgery

## 2021-07-22 ENCOUNTER — Ambulatory Visit: Payer: Medicare Other | Admitting: Urgent Care

## 2021-07-22 ENCOUNTER — Other Ambulatory Visit: Payer: Self-pay

## 2021-07-22 ENCOUNTER — Ambulatory Visit
Admission: RE | Admit: 2021-07-22 | Discharge: 2021-07-22 | Disposition: A | Discharge status: Home / Self Care | Payer: Medicare Other | Attending: General Surgery | Admitting: General Surgery

## 2021-07-22 ENCOUNTER — Encounter: Payer: Self-pay | Admitting: General Surgery

## 2021-07-22 ENCOUNTER — Ambulatory Visit
Admission: RE | Admit: 2021-07-22 | Discharge: 2021-07-22 | Disposition: A | Discharge status: Home / Self Care | Payer: Medicare Other | Source: Ambulatory Visit | Attending: General Surgery | Admitting: General Surgery

## 2021-07-22 DIAGNOSIS — K759 Inflammatory liver disease, unspecified: Secondary | ICD-10-CM | POA: Diagnosis not present

## 2021-07-22 DIAGNOSIS — K219 Gastro-esophageal reflux disease without esophagitis: Secondary | ICD-10-CM | POA: Insufficient documentation

## 2021-07-22 DIAGNOSIS — R5383 Other fatigue: Secondary | ICD-10-CM | POA: Insufficient documentation

## 2021-07-22 DIAGNOSIS — J449 Chronic obstructive pulmonary disease, unspecified: Secondary | ICD-10-CM | POA: Insufficient documentation

## 2021-07-22 DIAGNOSIS — E669 Obesity, unspecified: Secondary | ICD-10-CM | POA: Diagnosis not present

## 2021-07-22 DIAGNOSIS — C50211 Malignant neoplasm of upper-inner quadrant of right female breast: Secondary | ICD-10-CM | POA: Insufficient documentation

## 2021-07-22 DIAGNOSIS — Z87891 Personal history of nicotine dependence: Secondary | ICD-10-CM | POA: Insufficient documentation

## 2021-07-22 DIAGNOSIS — D759 Disease of blood and blood-forming organs, unspecified: Secondary | ICD-10-CM | POA: Diagnosis not present

## 2021-07-22 DIAGNOSIS — G4733 Obstructive sleep apnea (adult) (pediatric): Secondary | ICD-10-CM | POA: Insufficient documentation

## 2021-07-22 DIAGNOSIS — I700 Atherosclerosis of aorta: Secondary | ICD-10-CM | POA: Insufficient documentation

## 2021-07-22 DIAGNOSIS — Z9981 Dependence on supplemental oxygen: Secondary | ICD-10-CM | POA: Insufficient documentation

## 2021-07-22 DIAGNOSIS — I110 Hypertensive heart disease with heart failure: Secondary | ICD-10-CM | POA: Diagnosis not present

## 2021-07-22 DIAGNOSIS — D649 Anemia, unspecified: Secondary | ICD-10-CM | POA: Diagnosis not present

## 2021-07-22 DIAGNOSIS — Z170 Estrogen receptor positive status [ER+]: Secondary | ICD-10-CM | POA: Insufficient documentation

## 2021-07-22 DIAGNOSIS — F32A Depression, unspecified: Secondary | ICD-10-CM | POA: Diagnosis not present

## 2021-07-22 DIAGNOSIS — M1990 Unspecified osteoarthritis, unspecified site: Secondary | ICD-10-CM | POA: Insufficient documentation

## 2021-07-22 DIAGNOSIS — I4891 Unspecified atrial fibrillation: Secondary | ICD-10-CM | POA: Diagnosis not present

## 2021-07-22 DIAGNOSIS — Z8616 Personal history of COVID-19: Secondary | ICD-10-CM | POA: Diagnosis not present

## 2021-07-22 DIAGNOSIS — I5030 Unspecified diastolic (congestive) heart failure: Secondary | ICD-10-CM | POA: Insufficient documentation

## 2021-07-22 DIAGNOSIS — Z6830 Body mass index (BMI) 30.0-30.9, adult: Secondary | ICD-10-CM | POA: Diagnosis not present

## 2021-07-22 HISTORY — DX: Atherosclerosis of aorta: I70.0

## 2021-07-22 HISTORY — DX: Gastro-esophageal reflux disease without esophagitis: K21.9

## 2021-07-22 HISTORY — DX: Long term (current) use of anticoagulants: Z79.01

## 2021-07-22 HISTORY — DX: Chronic obstructive pulmonary disease, unspecified: G47.33

## 2021-07-22 HISTORY — DX: Obstructive sleep apnea (adult) (pediatric): J44.9

## 2021-07-22 HISTORY — DX: Dependence on supplemental oxygen: Z99.81

## 2021-07-22 HISTORY — PX: PARTIAL MASTECTOMY WITH AXILLARY SENTINEL LYMPH NODE BIOPSY: SHX6004

## 2021-07-22 SURGERY — PARTIAL MASTECTOMY WITH AXILLARY SENTINEL LYMPH NODE BIOPSY
Anesthesia: General | Site: Breast | Laterality: Right

## 2021-07-22 MED ORDER — PROMETHAZINE HCL 25 MG/ML IJ SOLN
INTRAMUSCULAR | Status: AC
  Filled 2021-07-22: qty 1

## 2021-07-22 MED ORDER — PROPOFOL 10 MG/ML IV BOLUS
INTRAVENOUS | Status: DC | PRN
  Administered 2021-07-22: 120 mg via INTRAVENOUS

## 2021-07-22 MED ORDER — FENTANYL CITRATE (PF) 100 MCG/2ML IJ SOLN
INTRAMUSCULAR | Status: AC
  Filled 2021-07-22: qty 2

## 2021-07-22 MED ORDER — ONDANSETRON HCL 4 MG/2ML IJ SOLN
INTRAMUSCULAR | Status: DC | PRN
  Administered 2021-07-22: 4 mg via INTRAVENOUS

## 2021-07-22 MED ORDER — PROPOFOL 10 MG/ML IV BOLUS
INTRAVENOUS | Status: AC
  Filled 2021-07-22: qty 20

## 2021-07-22 MED ORDER — GABAPENTIN 300 MG PO CAPS
ORAL_CAPSULE | ORAL | Status: AC
  Filled 2021-07-22: qty 1

## 2021-07-22 MED ORDER — MIDAZOLAM HCL 2 MG/2ML IJ SOLN
INTRAMUSCULAR | Status: AC
  Filled 2021-07-22: qty 2

## 2021-07-22 MED ORDER — METHYLENE BLUE 0.5 % INJ SOLN
INTRAVENOUS | Status: DC | PRN
  Administered 2021-07-22: 8 mL via INTRADERMAL

## 2021-07-22 MED ORDER — CHLORHEXIDINE GLUCONATE 0.12 % MT SOLN
OROMUCOSAL | Status: AC
  Administered 2021-07-22: 15 mL via OROMUCOSAL
  Filled 2021-07-22: qty 15

## 2021-07-22 MED ORDER — TECHNETIUM TC 99M TILMANOCEPT KIT
1.0000 | PACK | Freq: Once | INTRAVENOUS | Status: AC | PRN
  Administered 2021-07-22: 1 via INTRADERMAL

## 2021-07-22 MED ORDER — DEXAMETHASONE SODIUM PHOSPHATE 10 MG/ML IJ SOLN
INTRAMUSCULAR | Status: DC | PRN
  Administered 2021-07-22: 5 mg via INTRAVENOUS

## 2021-07-22 MED ORDER — PHENYLEPHRINE HCL-NACL 20-0.9 MG/250ML-% IV SOLN
INTRAVENOUS | Status: AC
  Filled 2021-07-22: qty 250

## 2021-07-22 MED ORDER — CEFAZOLIN SODIUM-DEXTROSE 2-4 GM/100ML-% IV SOLN
2.0000 g | INTRAVENOUS | Status: AC
  Administered 2021-07-22: 2 g via INTRAVENOUS

## 2021-07-22 MED ORDER — LACTATED RINGERS IV SOLN: INTRAVENOUS | Status: DC

## 2021-07-22 MED ORDER — ACETAMINOPHEN 10 MG/ML IV SOLN: 1000.0000 mg | Freq: Once | INTRAVENOUS | Status: DC | PRN

## 2021-07-22 MED ORDER — OXYCODONE HCL 5 MG/5ML PO SOLN: 5.0000 mg | Freq: Once | ORAL | Status: DC | PRN

## 2021-07-22 MED ORDER — SUCCINYLCHOLINE CHLORIDE 200 MG/10ML IV SOSY
PREFILLED_SYRINGE | INTRAVENOUS | Status: DC | PRN
  Administered 2021-07-22: 100 mg via INTRAVENOUS

## 2021-07-22 MED ORDER — SODIUM CHLORIDE FLUSH 0.9 % IV SOLN
INTRAVENOUS | Status: AC
  Filled 2021-07-22: qty 10

## 2021-07-22 MED ORDER — ACETAMINOPHEN 500 MG PO TABS
ORAL_TABLET | ORAL | Status: AC
  Filled 2021-07-22: qty 2

## 2021-07-22 MED ORDER — METHYLENE BLUE 0.5 % INJ SOLN
INTRAVENOUS | Status: AC
  Filled 2021-07-22: qty 10

## 2021-07-22 MED ORDER — FENTANYL CITRATE (PF) 100 MCG/2ML IJ SOLN: 25.0000 ug | INTRAMUSCULAR | Status: DC | PRN

## 2021-07-22 MED ORDER — BUPIVACAINE-EPINEPHRINE (PF) 0.5% -1:200000 IJ SOLN
INTRAMUSCULAR | Status: DC | PRN
  Administered 2021-07-22: 10 mL

## 2021-07-22 MED ORDER — FAMOTIDINE 20 MG PO TABS
ORAL_TABLET | ORAL | Status: AC
  Filled 2021-07-22: qty 1

## 2021-07-22 MED ORDER — ORAL CARE MOUTH RINSE: 15.0000 mL | Freq: Once | OROMUCOSAL | Status: AC

## 2021-07-22 MED ORDER — BUPIVACAINE-EPINEPHRINE (PF) 0.5% -1:200000 IJ SOLN
INTRAMUSCULAR | Status: AC
  Filled 2021-07-22: qty 30

## 2021-07-22 MED ORDER — CHLORHEXIDINE GLUCONATE 0.12 % MT SOLN: 15.0000 mL | Freq: Once | OROMUCOSAL | Status: AC

## 2021-07-22 MED ORDER — MIDAZOLAM HCL 2 MG/2ML IJ SOLN
INTRAMUSCULAR | Status: DC | PRN
  Administered 2021-07-22: 1 mg via INTRAVENOUS

## 2021-07-22 MED ORDER — CEFAZOLIN SODIUM-DEXTROSE 2-4 GM/100ML-% IV SOLN
INTRAVENOUS | Status: AC
  Filled 2021-07-22: qty 100

## 2021-07-22 MED ORDER — FENTANYL CITRATE (PF) 100 MCG/2ML IJ SOLN
INTRAMUSCULAR | Status: DC | PRN
  Administered 2021-07-22 (×2): 50 ug via INTRAVENOUS

## 2021-07-22 MED ORDER — DEXMEDETOMIDINE (PRECEDEX) IN NS 20 MCG/5ML (4 MCG/ML) IV SYRINGE
PREFILLED_SYRINGE | INTRAVENOUS | Status: DC | PRN
  Administered 2021-07-22: 8 ug via INTRAVENOUS
  Administered 2021-07-22: 4 ug via INTRAVENOUS

## 2021-07-22 MED ORDER — OXYCODONE HCL 5 MG PO TABS: 5.0000 mg | ORAL_TABLET | Freq: Once | ORAL | Status: DC | PRN

## 2021-07-22 MED ORDER — SEVOFLURANE IN SOLN
RESPIRATORY_TRACT | Status: AC
  Filled 2021-07-22: qty 250

## 2021-07-22 MED ORDER — PROMETHAZINE HCL 25 MG/ML IJ SOLN
6.2500 mg | INTRAMUSCULAR | Status: DC | PRN
  Administered 2021-07-22: 6.25 mg via INTRAVENOUS

## 2021-07-22 MED ORDER — LIDOCAINE HCL (CARDIAC) PF 100 MG/5ML IV SOSY
PREFILLED_SYRINGE | INTRAVENOUS | Status: DC | PRN
  Administered 2021-07-22: 80 mg via INTRAVENOUS

## 2021-07-22 MED ORDER — HYDROCODONE-ACETAMINOPHEN 5-325 MG PO TABS: 1.0000 | ORAL_TABLET | ORAL | 0 refills | Status: AC | PRN

## 2021-07-22 MED ORDER — FENTANYL CITRATE (PF) 100 MCG/2ML IJ SOLN
INTRAMUSCULAR | Status: AC
  Administered 2021-07-22: 25 ug via INTRAVENOUS
  Filled 2021-07-22: qty 2

## 2021-07-22 SURGICAL SUPPLY — 47 items
ADH SKN CLS APL DERMABOND .7 (GAUZE/BANDAGES/DRESSINGS) ×1
APL PRP STRL LF DISP 70% ISPRP (MISCELLANEOUS) ×1
BLADE SURG 15 STRL LF DISP TIS (BLADE) ×2 IMPLANT
BLADE SURG 15 STRL SS (BLADE) ×4
CHLORAPREP W/TINT 26 (MISCELLANEOUS) ×2 IMPLANT
CNTNR SPEC 2.5X3XGRAD LEK (MISCELLANEOUS) ×1
CONT SPEC 4OZ STER OR WHT (MISCELLANEOUS) ×1
CONT SPEC 4OZ STRL OR WHT (MISCELLANEOUS) ×1
CONTAINER SPEC 2.5X3XGRAD LEK (MISCELLANEOUS) ×1 IMPLANT
DERMABOND ADVANCED (GAUZE/BANDAGES/DRESSINGS) ×1
DERMABOND ADVANCED .7 DNX12 (GAUZE/BANDAGES/DRESSINGS) ×1 IMPLANT
DEVICE DUBIN SPECIMEN MAMMOGRA (MISCELLANEOUS) ×2 IMPLANT
DRAPE LAPAROTOMY TRNSV 106X77 (MISCELLANEOUS) ×2 IMPLANT
DRSG GAUZE FLUFF 36X18 (GAUZE/BANDAGES/DRESSINGS) IMPLANT
ELECT CAUTERY BLADE 6.4 (BLADE) ×2 IMPLANT
ELECT REM PT RETURN 9FT ADLT (ELECTROSURGICAL) ×2
ELECTRODE REM PT RTRN 9FT ADLT (ELECTROSURGICAL) ×1 IMPLANT
GAUZE 4X4 16PLY ~~LOC~~+RFID DBL (SPONGE) ×2 IMPLANT
GLOVE SURG ENC MOIS LTX SZ6.5 (GLOVE) ×4 IMPLANT
GLOVE SURG UNDER POLY LF SZ6.5 (GLOVE) ×4 IMPLANT
GOWN STRL REUS W/ TWL LRG LVL3 (GOWN DISPOSABLE) ×2 IMPLANT
GOWN STRL REUS W/TWL LRG LVL3 (GOWN DISPOSABLE) ×4
KIT MARKER MARGIN INK (KITS) ×2 IMPLANT
KIT TURNOVER KIT A (KITS) ×2 IMPLANT
LABEL OR SOLS (LABEL) ×2 IMPLANT
MANIFOLD NEPTUNE II (INSTRUMENTS) ×2 IMPLANT
MARGIN MAP 10MM (MISCELLANEOUS) IMPLANT
MARKER MARGIN CORRECT CLIP (MARKER) ×2 IMPLANT
NEEDLE HYPO 22GX1.5 SAFETY (NEEDLE) ×2 IMPLANT
NEEDLE HYPO 25X1 1.5 SAFETY (NEEDLE) ×2 IMPLANT
PACK BASIN MINOR ARMC (MISCELLANEOUS) ×2 IMPLANT
RETRACTOR RING XSMALL (MISCELLANEOUS) ×1 IMPLANT
RTRCTR WOUND ALEXIS 13CM XS SH (MISCELLANEOUS) ×2
SLEVE PROBE SENORX GAMMA FIND (MISCELLANEOUS) ×2 IMPLANT
SUT ETHILON 3-0 FS-10 30 BLK (SUTURE)
SUT MNCRL 4-0 (SUTURE) ×4
SUT MNCRL 4-0 27XMFL (SUTURE) ×2
SUT SILK 2 0 SH (SUTURE) ×2 IMPLANT
SUT VIC AB 3-0 SH 27 (SUTURE) ×4
SUT VIC AB 3-0 SH 27X BRD (SUTURE) ×2 IMPLANT
SUTURE EHLN 3-0 FS-10 30 BLK (SUTURE) IMPLANT
SUTURE MNCRL 4-0 27XMF (SUTURE) ×2 IMPLANT
SYR 10ML LL (SYRINGE) ×4 IMPLANT
SYR BULB IRRIG 60ML STRL (SYRINGE) ×2 IMPLANT
TRAP NEPTUNE SPECIMEN COLLECT (MISCELLANEOUS) ×2 IMPLANT
WATER STERILE IRR 1000ML POUR (IV SOLUTION) ×2 IMPLANT
WATER STERILE IRR 500ML POUR (IV SOLUTION) ×2 IMPLANT

## 2021-07-22 NOTE — Transfer of Care (Signed)
Immediate Anesthesia Transfer of Care Note  Patient: Marianna Payment  Procedure(s) Performed: PARTIAL MASTECTOMY WITH AXILLARY SENTINEL LYMPH NODE BIOPSY RF guided (Right: Breast)  Patient Location: PACU  Anesthesia Type:General  Level of Consciousness: awake, drowsy and patient cooperative  Airway & Oxygen Therapy: Patient Spontanous Breathing and Patient connected to face mask oxygen  Post-op Assessment: Report given to RN and Post -op Vital signs reviewed and stable  Post vital signs: Reviewed and stable  Last Vitals:  Vitals Value Taken Time  BP 117/66 07/22/21 1206  Temp    Pulse 51 07/22/21 1208  Resp 17 07/22/21 1208  SpO2 91 % 07/22/21 1208  Vitals shown include unvalidated device data.  Last Pain:  Vitals:   07/22/21 0943  TempSrc: Temporal  PainSc: 6          Complications: No notable events documented.

## 2021-07-22 NOTE — Anesthesia Procedure Notes (Signed)
Procedure Name: Intubation Date/Time: 07/22/2021 10:27 AM Performed by: Jerrye Noble, CRNA Pre-anesthesia Checklist: Patient identified, Emergency Drugs available, Suction available and Patient being monitored Patient Re-evaluated:Patient Re-evaluated prior to induction Oxygen Delivery Method: Circle system utilized Preoxygenation: Pre-oxygenation with 100% oxygen Induction Type: IV induction Ventilation: Mask ventilation without difficulty Laryngoscope Size: McGraph and 3 Grade View: Grade I Tube type: Oral Tube size: 7.0 mm Number of attempts: 1 Airway Equipment and Method: Stylet, Oral airway and Video-laryngoscopy Placement Confirmation: ETT inserted through vocal cords under direct vision, positive ETCO2 and breath sounds checked- equal and bilateral Secured at: 21 cm Tube secured with: Tape Dental Injury: Teeth and Oropharynx as per pre-operative assessment

## 2021-07-22 NOTE — Interval H&P Note (Signed)
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  The various methods of treatment have been discussed with the patient and family. After consideration of risks, benefits and other options for treatment, the patient has consented to  Procedure(s): PARTIAL MASTECTOMY WITH AXILLARY SENTINEL LYMPH NODE BIOPSY RF guided (Right) as a surgical intervention.  The patient's history has been reviewed, patient examined, no change in status, stable for surgery.  I have reviewed the patient's chart and labs.  Questions were answered to the patient's satisfaction.     Herbert Pun

## 2021-07-22 NOTE — Discharge Instructions (Addendum)
  Diet: Resume home heart healthy regular diet.   Activity: No heavy lifting >20 pounds (children, pets, laundry, garbage) or strenuous activity until follow-up, but light activity and walking are encouraged. Do not drive or drink alcohol if taking narcotic pain medications.  Wound care: May shower with soapy water and pat dry (do not rub incisions), but no baths or submerging incision underwater until follow-up. (no swimming)   Medications: Resume all home medications. For mild to moderate pain: acetaminophen (Tylenol) or ibuprofen (if no kidney disease). Combining Tylenol with alcohol can substantially increase your risk of causing liver disease. Narcotic pain medications, if prescribed, can be used for severe pain, though may cause nausea, constipation, and drowsiness. Do not combine Tylenol and Norco within a 6 hour period as Norco contains Tylenol. If you do not need the narcotic pain medication, you do not need to fill the prescription.  Call office ([phone]) at any time if any questions, worsening pain, fevers/chills, bleeding, drainage from incision site, or other concerns. AMBULATORY SURGERY  DISCHARGE INSTRUCTIONS   The drugs that you were given will stay in your system until tomorrow so for the next 24 hours you should not:  Drive an automobile Make any legal decisions Drink any alcoholic beverage   You may resume regular meals tomorrow.  Today it is better to start with liquids and gradually work up to solid foods.  You may eat anything you prefer, but it is better to start with liquids, then soup and crackers, and gradually work up to solid foods.   Please notify your doctor immediately if you have any unusual bleeding, trouble breathing, redness and pain at the surgery site, drainage, fever, or pain not relieved by medication.    Your post-operative visit with Dr.                                       is: Date:                        Time:    Please call to  schedule your post-operative visit.  Additional Instructions: 

## 2021-07-22 NOTE — Op Note (Signed)
Preoperative diagnosis: Right breast carcinoma.  Postoperative diagnosis: Same.   Procedure: Right radiofrequency tag-localized partial mastectomy.                      Right Axillary Sentinel Lymph node biopsy  Anesthesia: GETA  Surgeon: Dr. Windell Moment  Wound Classification: Clean  Indications: Patient is a 65 y.o. female with a nonpalpable right breast mass noted on mammography with core biopsy demonstrating invasive mammary carcinoma requires radiofrequency tag-localized partial mastectomy for treatment with sentinel lymph node biopsy.   Findings: 1. Specimen mammography shows marker and tag on specimen 2. Pathology call refers gross examination of margins was clear 3. No other palpable mass or lymph node identified.   Description of procedure: Preoperative radiofrequency tag localization was performed by radiology. In the nuclear medicine suite, the subareolar region was injected with Tc-99 sulfur colloid. Localization studies were reviewed. The patient was taken to the operating room and placed supine on the operating table, and after general anesthesia the right chest and axilla were prepped and draped in the usual sterile fashion. A time-out was completed verifying correct patient, procedure, site, positioning, and implant(s) and/or special equipment prior to beginning this procedure. I personally injected methylene blue dye around the nipple for intra operative finding of axillary sentinel lymph node.  By comparing the localization studies and interrogation with Localizer device, the probable trajectory and location of the mass was visualized. A circumareolar skin incision was planned in such a way as to minimize the amount of dissection to reach the mass.  The skin incision was made. Flaps were raised and the location of the tag was confirmed with Localizer device confirmed. A 2-0 silk figure-of-eight stay suture was placed and used for retraction. Dissection was then taken down  circumferentially, taking care to include the entire localizing tag and a wide margin of grossly normal tissue. The specimen and entire localizing tag were removed. The specimen was oriented and sent to radiology with the localization studies. Confirmation was received that the entire target lesion had been resected. The wound was irrigated. Hemostasis was checked. The wound was closed with interrupted sutures of 3-0 Vicryl and a subcuticular suture of Monocryl 3-0. No attempt was made to close the dead space.   A hand-held gamma probe was used to identify the location of the hottest spot in the axilla. An incision was made around the caudal axillary hairline. Dissection was carried down until subdermal facias was advanced. The probe was placed and again, the point of maximal count was found. Dissection continue until nodule was identified. The probe was placed in contact with the node. The node was excised in its entirety.  An additional hot spot was detected and the node was excised in similar fashion. No additional hot spots were identified. No clinically abnormal nodes were palpated. The procedure was terminated. Hemostasis was achieved and the wound closed in layers with deep interrupted 3-0 Vicryl and skin was closed with subcuticular suture of Monocryl 3-0.  The patient tolerated the procedure well and was taken to the postanesthesia care unit in stable condition.   Sentinel Node Biopsy Synoptic Operative Report  Operation performed with curative intent:Yes  Tracer(s) used to identify sentinel nodes in the upfront surgery (non-neoadjuvant) setting (select all that apply):N/A  Tracer(s) used to identify sentinel nodes in the neoadjuvant setting (select all that apply):Dye and Radioactive Tracer  All nodes (colored or non-colored) present at the end of a dye-filled lymphatic channel were removed:Yes  All significantly radioactive nodes were removed:Yes  All palpable suspicious nodes were  removed:N/A  Biopsy-proven positive nodes marked with clips prior to chemotherapy were identified and removed:N/A  Specimen: Right Left Breast mass                     Sentinel Lymph nodes #1, #2  Complications: None  Estimated Blood Loss: 5 mL

## 2021-07-22 NOTE — Anesthesia Postprocedure Evaluation (Signed)
Anesthesia Post Note  Patient: Marianna Payment  Procedure(s) Performed: PARTIAL MASTECTOMY WITH AXILLARY SENTINEL LYMPH NODE BIOPSY RF guided (Right: Breast)  Patient location during evaluation: PACU Anesthesia Type: General Level of consciousness: awake and alert Pain management: pain level controlled Vital Signs Assessment: post-procedure vital signs reviewed and stable Respiratory status: spontaneous breathing, nonlabored ventilation and respiratory function stable Cardiovascular status: blood pressure returned to baseline and stable Postop Assessment: no apparent nausea or vomiting Anesthetic complications: no   No notable events documented.   Last Vitals:  Vitals:   07/22/21 1230 07/22/21 1247  BP: 116/65 (!) 121/50  Pulse: 88 82  Resp: 14 16  Temp: (!) 36.2 C (!) 36.2 C  SpO2: 99% 100%    Last Pain:  Vitals:   07/22/21 1247  TempSrc: Temporal  PainSc: [place]

## 2021-07-23 ENCOUNTER — Other Ambulatory Visit: Payer: Self-pay | Admitting: Oncology

## 2021-07-24 ENCOUNTER — Other Ambulatory Visit: Payer: Self-pay | Admitting: Anatomic Pathology & Clinical Pathology

## 2021-07-24 LAB — SURGICAL PATHOLOGY

## 2021-08-05 ENCOUNTER — Inpatient Hospital Stay: Payer: Medicare Other | Attending: Oncology | Admitting: Oncology

## 2021-08-05 ENCOUNTER — Other Ambulatory Visit: Payer: Self-pay

## 2021-08-05 VITALS — BP 131/60 | HR 96 | Temp 97.6°F | Resp 16 | Wt 187.2 lb

## 2021-08-05 DIAGNOSIS — J449 Chronic obstructive pulmonary disease, unspecified: Secondary | ICD-10-CM | POA: Diagnosis not present

## 2021-08-05 DIAGNOSIS — C50211 Malignant neoplasm of upper-inner quadrant of right female breast: Secondary | ICD-10-CM | POA: Diagnosis present

## 2021-08-05 DIAGNOSIS — Z9011 Acquired absence of right breast and nipple: Secondary | ICD-10-CM | POA: Diagnosis not present

## 2021-08-05 DIAGNOSIS — T451X5A Adverse effect of antineoplastic and immunosuppressive drugs, initial encounter: Secondary | ICD-10-CM | POA: Insufficient documentation

## 2021-08-05 DIAGNOSIS — D6481 Anemia due to antineoplastic chemotherapy: Secondary | ICD-10-CM | POA: Insufficient documentation

## 2021-08-05 DIAGNOSIS — J9610 Chronic respiratory failure, unspecified whether with hypoxia or hypercapnia: Secondary | ICD-10-CM | POA: Insufficient documentation

## 2021-08-05 DIAGNOSIS — Z87891 Personal history of nicotine dependence: Secondary | ICD-10-CM | POA: Diagnosis not present

## 2021-08-05 DIAGNOSIS — Z803 Family history of malignant neoplasm of breast: Secondary | ICD-10-CM | POA: Insufficient documentation

## 2021-08-05 DIAGNOSIS — Z8616 Personal history of COVID-19: Secondary | ICD-10-CM | POA: Insufficient documentation

## 2021-08-05 DIAGNOSIS — C50919 Malignant neoplasm of unspecified site of unspecified female breast: Secondary | ICD-10-CM

## 2021-08-05 DIAGNOSIS — L98491 Non-pressure chronic ulcer of skin of other sites limited to breakdown of skin: Secondary | ICD-10-CM | POA: Insufficient documentation

## 2021-08-05 DIAGNOSIS — M7989 Other specified soft tissue disorders: Secondary | ICD-10-CM | POA: Diagnosis not present

## 2021-08-05 DIAGNOSIS — Z170 Estrogen receptor positive status [ER+]: Secondary | ICD-10-CM | POA: Insufficient documentation

## 2021-08-05 NOTE — Progress Notes (Signed)
Pt reports clear drainage from right breast.

## 2021-08-06 ENCOUNTER — Other Ambulatory Visit: Payer: Self-pay | Admitting: Oncology

## 2021-08-06 NOTE — Telephone Encounter (Signed)
°  Order: [id number]  Ref Range & Units 2 wk ago  Potassium 3.6 - 5.1 mmol/L 4.6

## 2021-08-07 ENCOUNTER — Encounter: Payer: Self-pay | Admitting: Oncology
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
## 2021-08-07 NOTE — Progress Notes (Signed)
DISCONTINUE ON PATHWAY REGIMEN - Breast ° ° °  A cycle is every 21 days: °    Pertuzumab  °    Pertuzumab  °    Trastuzumab-xxxx  °    Trastuzumab-xxxx  °    Carboplatin  °    Docetaxel  ° °**Always confirm dose/schedule in your pharmacy ordering system** ° °REASON: Other Reason °PRIOR TREATMENT: [id number]: Docetaxel + Carboplatin + Trastuzumab IV + Pertuzumab IV ([facility]P IV) q21 Days x 6 Cycles °TREATMENT RESPONSE: Partial Response (PR) ° °START ON PATHWAY REGIMEN - Breast ° ° °  A cycle is every 21 days: °    Ado-trastuzumab emtansine  ° °**Always confirm dose/schedule in your pharmacy ordering system** ° °Patient Characteristics: °Post-Neoadjuvant Therapy and Resection, HER2 Positive, ER Positive, Residual Disease, Adjuvant Targeted Therapy After Neoadjuvant Chemo/Targeted Therapy °Therapeutic Status: Post-Neoadjuvant Therapy and Resection °Residual Invasive Disease Post-Neoadjuvant Therapy<= Yes °ER Status: Positive (+) °HER2 Status: Positive (+) °PR Status: Positive (+) °Intent of Therapy: °Curative Intent, Discussed with Patient °

## 2021-08-07 NOTE — Progress Notes (Signed)
ON PATHWAY REGIMEN - Breast  No Change  Continue With Treatment as Ordered.  Original Decision Date/Time: 01/28/2021 23:44     A cycle is every 21 days:     Pertuzumab      Pertuzumab      Trastuzumab-xxxx      Trastuzumab-xxxx      Carboplatin      Docetaxel   **Always confirm dose/schedule in your pharmacy ordering system**  Patient Characteristics: Preoperative or Nonsurgical Candidate (Clinical Staging), Neoadjuvant Therapy followed by Surgery, Invasive Disease, Chemotherapy, HER2 Positive, ER Positive Therapeutic Status: Preoperative or Nonsurgical Candidate (Clinical Staging) AJCC M Category: cM0 AJCC Grade: G1 Breast Surgical Plan: Neoadjuvant Therapy followed by Surgery ER Status: Positive (+) AJCC 8 Stage Grouping: IB HER2 Status: Positive (+) AJCC T Category: cT2 AJCC N Category: cN0 PR Status: Positive (+) Intent of Therapy: Curative Intent, Discussed with Patient

## 2021-08-08 ENCOUNTER — Encounter: Payer: Self-pay | Admitting: Emergency Medicine

## 2021-08-08 ENCOUNTER — Emergency Department
Admission: EM | Admit: 2021-08-08 | Discharge: 2021-08-08 | Disposition: A | Discharge status: Home / Self Care | Payer: Medicare Other | Attending: Emergency Medicine | Admitting: Emergency Medicine

## 2021-08-08 ENCOUNTER — Other Ambulatory Visit: Payer: Self-pay

## 2021-08-08 ENCOUNTER — Emergency Department: Payer: Medicare Other

## 2021-08-08 DIAGNOSIS — Z20822 Contact with and (suspected) exposure to covid-19: Secondary | ICD-10-CM | POA: Diagnosis not present

## 2021-08-08 DIAGNOSIS — Z79899 Other long term (current) drug therapy: Secondary | ICD-10-CM | POA: Insufficient documentation

## 2021-08-08 DIAGNOSIS — J441 Chronic obstructive pulmonary disease with (acute) exacerbation: Secondary | ICD-10-CM | POA: Diagnosis not present

## 2021-08-08 DIAGNOSIS — Z853 Personal history of malignant neoplasm of breast: Secondary | ICD-10-CM | POA: Insufficient documentation

## 2021-08-08 DIAGNOSIS — R0602 Shortness of breath: Secondary | ICD-10-CM

## 2021-08-08 DIAGNOSIS — R609 Edema, unspecified: Secondary | ICD-10-CM | POA: Diagnosis not present

## 2021-08-08 DIAGNOSIS — N3000 Acute cystitis without hematuria: Secondary | ICD-10-CM | POA: Insufficient documentation

## 2021-08-08 DIAGNOSIS — I10 Essential (primary) hypertension: Secondary | ICD-10-CM | POA: Insufficient documentation

## 2021-08-08 DIAGNOSIS — Z7901 Long term (current) use of anticoagulants: Secondary | ICD-10-CM | POA: Insufficient documentation

## 2021-08-08 DIAGNOSIS — Z87891 Personal history of nicotine dependence: Secondary | ICD-10-CM | POA: Diagnosis not present

## 2021-08-08 DIAGNOSIS — C50919 Malignant neoplasm of unspecified site of unspecified female breast: Secondary | ICD-10-CM

## 2021-08-08 LAB — URINALYSIS, COMPLETE (UACMP) WITH MICROSCOPIC
Bilirubin Urine: NEGATIVE
Glucose, UA: NEGATIVE mg/dL
Ketones, ur: NEGATIVE mg/dL
Nitrite: NEGATIVE
Protein, ur: NEGATIVE mg/dL
Specific Gravity, Urine: 1.01 (ref 1.005–1.030)
pH: 5.5 (ref 5.0–8.0)

## 2021-08-08 LAB — PROCALCITONIN: Procalcitonin: <0.1 ng/mL

## 2021-08-08 LAB — CBC
HCT: 28.7 % — ABNORMAL LOW (ref 36.0–46.0)
Hemoglobin: 8.5 g/dL — ABNORMAL LOW (ref 12.0–15.0)
MCH: 29.5 pg (ref 26.0–34.0)
MCHC: 29.6 g/dL — ABNORMAL LOW (ref 30.0–36.0)
MCV: 99.7 fL (ref 80.0–100.0)
Platelets: 271 10*3/uL (ref 150–400)
RBC: 2.88 MIL/uL — ABNORMAL LOW (ref 3.87–5.11)
RDW: 15.2 % (ref 11.5–15.5)
WBC: 7.2 10*3/uL (ref 4.0–10.5)
nRBC: 0 % (ref 0.0–0.2)

## 2021-08-08 LAB — COMPREHENSIVE METABOLIC PANEL
ALT: 8 U/L (ref 0–44)
AST: 15 U/L (ref 15–41)
Albumin: 3.1 g/dL — ABNORMAL LOW (ref 3.5–5.0)
Alkaline Phosphatase: 98 U/L (ref 38–126)
Anion gap: 6 (ref 5–15)
BUN: 10 mg/dL (ref 8–23)
CO2: 32 mmol/L (ref 22–32)
Calcium: 8.7 mg/dL — ABNORMAL LOW (ref 8.9–10.3)
Chloride: 101 mmol/L (ref 98–111)
Creatinine, Ser: 0.82 mg/dL (ref 0.44–1.00)
GFR, Estimated: >60 mL/min (ref >60)
Glucose, Bld: 136 mg/dL — ABNORMAL HIGH (ref 70–99)
Potassium: 4.2 mmol/L (ref 3.5–5.1)
Sodium: 139 mmol/L (ref 135–145)
Total Bilirubin: 0.6 mg/dL (ref 0.3–1.2)
Total Protein: 6.9 g/dL (ref 6.5–8.1)

## 2021-08-08 LAB — PROTIME-INR
INR: 1.2 (ref 0.8–1.2)
Prothrombin Time: 15.5 seconds — ABNORMAL HIGH (ref 11.4–15.2)

## 2021-08-08 LAB — BLOOD GAS, VENOUS
Acid-Base Excess: 9.3 mmol/L — ABNORMAL HIGH (ref 0.0–2.0)
Bicarbonate: 36.4 mmol/L — ABNORMAL HIGH (ref 20.0–28.0)
O2 Saturation: 94.8 %
Patient temperature: 37
pCO2, Ven: 66 mmHg — ABNORMAL HIGH (ref 44.0–60.0)
pH, Ven: 7.35 (ref 7.250–7.430)
pO2, Ven: 78 mmHg — ABNORMAL HIGH (ref 32.0–45.0)

## 2021-08-08 LAB — LACTIC ACID, PLASMA
Lactic Acid, Venous: 1.7 mmol/L (ref 0.5–1.9)
Lactic Acid, Venous: 1.8 mmol/L (ref 0.5–1.9)

## 2021-08-08 LAB — RESP PANEL BY RT-PCR (FLU A&B, COVID) ARPGX2
Influenza A by PCR: NEGATIVE
Influenza B by PCR: NEGATIVE
SARS Coronavirus 2 by RT PCR: NEGATIVE

## 2021-08-08 LAB — TROPONIN I (HIGH SENSITIVITY)
Troponin I (High Sensitivity): 3 ng/L (ref <18)
Troponin I (High Sensitivity): 3 ng/L (ref <18)

## 2021-08-08 LAB — APTT: aPTT: 40 seconds — ABNORMAL HIGH (ref 24–36)

## 2021-08-08 LAB — BRAIN NATRIURETIC PEPTIDE: B Natriuretic Peptide: 15.6 pg/mL (ref 0.0–100.0)

## 2021-08-08 MED ORDER — IPRATROPIUM-ALBUTEROL 0.5-2.5 (3) MG/3ML IN SOLN
9.0000 mL | Freq: Once | RESPIRATORY_TRACT | Status: AC
  Administered 2021-08-08: 03:00:00 9 mL via RESPIRATORY_TRACT
  Filled 2021-08-08: qty 9

## 2021-08-08 MED ORDER — METHYLPREDNISOLONE SODIUM SUCC 125 MG IJ SOLR
125.0000 mg | INTRAMUSCULAR | Status: AC
  Administered 2021-08-08: 03:00:00 125 mg via INTRAVENOUS
  Filled 2021-08-08: qty 2

## 2021-08-08 MED ORDER — PREDNISONE 10 MG PO TABS: 50.0000 mg | ORAL_TABLET | Freq: Every day | ORAL | 0 refills | Status: AC

## 2021-08-08 MED ORDER — LACTATED RINGERS IV BOLUS
1000.0000 mL | Freq: Once | INTRAVENOUS | Status: AC
  Administered 2021-08-08: 04:00:00 1000 mL via INTRAVENOUS

## 2021-08-08 MED ORDER — SODIUM CHLORIDE 0.9 % IV SOLN
1.0000 g | Freq: Once | INTRAVENOUS | Status: AC
  Administered 2021-08-08: 04:00:00 1 g via INTRAVENOUS
  Filled 2021-08-08: qty 10

## 2021-08-08 MED ORDER — CEPHALEXIN 500 MG PO CAPS
500.0000 mg | ORAL_CAPSULE | Freq: Four times a day (QID) | ORAL | 0 refills | Status: AC
Start: 2021-08-08 — End: 2021-08-13

## 2021-08-08 NOTE — ED Triage Notes (Addendum)
Pt to ED from home c/o SOB today.  Son states he's noticed she seems more confused and "talking out of her head" but not slurred speech according to son.  Pt denies pain, states has had productive clear cough, denies fevers, denies n/v/d.  Pt has breast cancer, last chemo in November and no radiation.  Pt has hx of COPD and wears chronic oxygen 2L.  Pt is A&Ox4, chest rise even and labored, audible wheezing heard, pale dry skin, denies weakness, numbness, or facial droop.
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
## 2021-08-08 NOTE — ED Notes (Signed)
Pt consumed water without issue.

## 2021-08-08 NOTE — ED Notes (Addendum)
Pt ambulated around the bed with assistance from this RN - Pts sats remained at 99% 2L [place]. MD aware.

## 2021-08-08 NOTE — ED Notes (Signed)
Pts Son took the patients portable O2 tank home.

## 2021-08-08 NOTE — ED Notes (Signed)
E-signature pad unavailable - Pt verbalized understanding of D/C information - no additional concerns at this time.  

## 2021-08-09 ENCOUNTER — Encounter: Payer: Self-pay | Admitting: Oncology

## 2021-08-09 ENCOUNTER — Other Ambulatory Visit: Payer: Self-pay | Admitting: Oncology

## 2021-08-09 MED ORDER — POTASSIUM CHLORIDE CRYS ER 20 MEQ PO TBCR: 20.0000 meq | EXTENDED_RELEASE_TABLET | Freq: Every day | ORAL | 0 refills | Status: DC

## 2021-08-10 LAB — URINE CULTURE: Culture: >=100000 — AB

## 2021-08-13 ENCOUNTER — Other Ambulatory Visit: Payer: Self-pay | Admitting: Oncology

## 2021-08-13 ENCOUNTER — Ambulatory Visit
Admission: RE | Admit: 2021-08-13 | Discharge: 2021-08-13 | Disposition: A | Discharge status: Home / Self Care | Payer: Medicare Other | Source: Ambulatory Visit | Attending: Oncology | Admitting: Oncology

## 2021-08-13 DIAGNOSIS — M7989 Other specified soft tissue disorders: Secondary | ICD-10-CM

## 2021-08-13 LAB — CULTURE, BLOOD (ROUTINE X 2)
Culture: NO GROWTH
Culture: NO GROWTH
Special Requests: ADEQUATE

## 2021-08-13 NOTE — Progress Notes (Signed)
Pharmacist Chemotherapy Monitoring - Initial Assessment    Anticipated start date: 08/22/21   The following has been reviewed per standard work regarding the patient's treatment regimen: The patient's diagnosis, treatment plan and drug doses, and organ/hematologic function Lab orders and baseline tests specific to treatment regimen  The treatment plan start date, drug sequencing, and pre-medications Prior authorization status  Patient's documented medication list, including drug-drug interaction screen and prescriptions for anti-emetics and supportive care specific to the treatment regimen The drug concentrations, fluid compatibility, administration routes, and timing of the medications to be used The patient's access for treatment and lifetime cumulative dose history, if applicable  The patient's medication allergies and previous infusion related reactions, if applicable   Changes made to treatment plan:  treatment plan date  Follow up needed:  signing treatment plan   Debra [name], [place], 08/13/2021  2:22 PM

## 2021-08-16 ENCOUNTER — Institutional Professional Consult (permissible substitution): Payer: Medicare Other | Admitting: Radiation Oncology

## 2021-08-20 ENCOUNTER — Institutional Professional Consult (permissible substitution): Payer: Medicare Other | Admitting: Radiation Oncology

## 2021-08-22 ENCOUNTER — Other Ambulatory Visit: Payer: Self-pay

## 2021-08-22 ENCOUNTER — Other Ambulatory Visit: Payer: Self-pay | Admitting: Oncology

## 2021-08-22 ENCOUNTER — Inpatient Hospital Stay (HOSPITAL_BASED_OUTPATIENT_CLINIC_OR_DEPARTMENT_OTHER): Payer: Medicare Other | Admitting: Oncology

## 2021-08-22 ENCOUNTER — Inpatient Hospital Stay: Payer: Medicare Other

## 2021-08-22 ENCOUNTER — Encounter: Payer: Self-pay | Admitting: Radiation Oncology

## 2021-08-22 ENCOUNTER — Inpatient Hospital Stay: Payer: Medicare Other | Attending: Oncology

## 2021-08-22 ENCOUNTER — Ambulatory Visit
Admission: RE | Admit: 2021-08-22 | Discharge: 2021-08-22 | Disposition: A | Discharge status: Home / Self Care | Payer: Medicare Other | Source: Ambulatory Visit | Attending: Radiation Oncology | Admitting: Radiation Oncology

## 2021-08-22 ENCOUNTER — Encounter: Payer: Self-pay | Admitting: Oncology

## 2021-08-22 VITALS — BP 141/51 | HR 105 | Temp 98.1°F | Resp 18 | Wt 177.7 lb

## 2021-08-22 DIAGNOSIS — R634 Abnormal weight loss: Secondary | ICD-10-CM | POA: Insufficient documentation

## 2021-08-22 DIAGNOSIS — R110 Nausea: Secondary | ICD-10-CM | POA: Insufficient documentation

## 2021-08-22 DIAGNOSIS — C50919 Malignant neoplasm of unspecified site of unspecified female breast: Secondary | ICD-10-CM

## 2021-08-22 DIAGNOSIS — Z5111 Encounter for antineoplastic chemotherapy: Secondary | ICD-10-CM

## 2021-08-22 DIAGNOSIS — Z87891 Personal history of nicotine dependence: Secondary | ICD-10-CM | POA: Insufficient documentation

## 2021-08-22 DIAGNOSIS — E785 Hyperlipidemia, unspecified: Secondary | ICD-10-CM | POA: Insufficient documentation

## 2021-08-22 DIAGNOSIS — M7989 Other specified soft tissue disorders: Secondary | ICD-10-CM

## 2021-08-22 DIAGNOSIS — R7989 Other specified abnormal findings of blood chemistry: Secondary | ICD-10-CM | POA: Diagnosis not present

## 2021-08-22 DIAGNOSIS — L98499 Non-pressure chronic ulcer of skin of other sites with unspecified severity: Secondary | ICD-10-CM | POA: Diagnosis not present

## 2021-08-22 DIAGNOSIS — Z803 Family history of malignant neoplasm of breast: Secondary | ICD-10-CM | POA: Diagnosis not present

## 2021-08-22 DIAGNOSIS — T451X5A Adverse effect of antineoplastic and immunosuppressive drugs, initial encounter: Secondary | ICD-10-CM | POA: Insufficient documentation

## 2021-08-22 DIAGNOSIS — M8580 Other specified disorders of bone density and structure, unspecified site: Secondary | ICD-10-CM | POA: Insufficient documentation

## 2021-08-22 DIAGNOSIS — Z79899 Other long term (current) drug therapy: Secondary | ICD-10-CM | POA: Insufficient documentation

## 2021-08-22 DIAGNOSIS — K5900 Constipation, unspecified: Secondary | ICD-10-CM | POA: Diagnosis not present

## 2021-08-22 DIAGNOSIS — D6481 Anemia due to antineoplastic chemotherapy: Secondary | ICD-10-CM | POA: Diagnosis not present

## 2021-08-22 DIAGNOSIS — J9610 Chronic respiratory failure, unspecified whether with hypoxia or hypercapnia: Secondary | ICD-10-CM | POA: Insufficient documentation

## 2021-08-22 DIAGNOSIS — N390 Urinary tract infection, site not specified: Secondary | ICD-10-CM | POA: Insufficient documentation

## 2021-08-22 DIAGNOSIS — Z801 Family history of malignant neoplasm of trachea, bronchus and lung: Secondary | ICD-10-CM | POA: Insufficient documentation

## 2021-08-22 DIAGNOSIS — I10 Essential (primary) hypertension: Secondary | ICD-10-CM | POA: Diagnosis not present

## 2021-08-22 DIAGNOSIS — Z510 Encounter for antineoplastic radiation therapy: Secondary | ICD-10-CM | POA: Diagnosis present

## 2021-08-22 DIAGNOSIS — R5381 Other malaise: Secondary | ICD-10-CM | POA: Diagnosis not present

## 2021-08-22 DIAGNOSIS — I700 Atherosclerosis of aorta: Secondary | ICD-10-CM | POA: Diagnosis not present

## 2021-08-22 DIAGNOSIS — Z170 Estrogen receptor positive status [ER+]: Secondary | ICD-10-CM | POA: Diagnosis not present

## 2021-08-22 DIAGNOSIS — Z7901 Long term (current) use of anticoagulants: Secondary | ICD-10-CM | POA: Diagnosis not present

## 2021-08-22 DIAGNOSIS — Z808 Family history of malignant neoplasm of other organs or systems: Secondary | ICD-10-CM | POA: Insufficient documentation

## 2021-08-22 DIAGNOSIS — C50211 Malignant neoplasm of upper-inner quadrant of right female breast: Secondary | ICD-10-CM | POA: Insufficient documentation

## 2021-08-22 DIAGNOSIS — Z8051 Family history of malignant neoplasm of kidney: Secondary | ICD-10-CM | POA: Insufficient documentation

## 2021-08-22 DIAGNOSIS — I4891 Unspecified atrial fibrillation: Secondary | ICD-10-CM | POA: Diagnosis not present

## 2021-08-22 DIAGNOSIS — Z8052 Family history of malignant neoplasm of bladder: Secondary | ICD-10-CM | POA: Diagnosis not present

## 2021-08-22 LAB — COMPREHENSIVE METABOLIC PANEL
ALT: 14 U/L (ref 0–44)
AST: 22 U/L (ref 15–41)
Albumin: 3.4 g/dL — ABNORMAL LOW (ref 3.5–5.0)
Alkaline Phosphatase: 89 U/L (ref 38–126)
Anion gap: 7 (ref 5–15)
BUN: 13 mg/dL (ref 8–23)
CO2: 31 mmol/L (ref 22–32)
Calcium: 9.1 mg/dL (ref 8.9–10.3)
Chloride: 99 mmol/L (ref 98–111)
Creatinine, Ser: 1.07 mg/dL — ABNORMAL HIGH (ref 0.44–1.00)
GFR, Estimated: 57 mL/min — ABNORMAL LOW (ref >60)
Glucose, Bld: 127 mg/dL — ABNORMAL HIGH (ref 70–99)
Potassium: 3.9 mmol/L (ref 3.5–5.1)
Sodium: 137 mmol/L (ref 135–145)
Total Bilirubin: 0.5 mg/dL (ref 0.3–1.2)
Total Protein: 7.1 g/dL (ref 6.5–8.1)

## 2021-08-22 LAB — CBC WITH DIFFERENTIAL/PLATELET
Abs Immature Granulocytes: 0.01 10*3/uL (ref 0.00–0.07)
Basophils Absolute: 0 10*3/uL (ref 0.0–0.1)
Basophils Relative: 0 %
Eosinophils Absolute: 0.4 10*3/uL (ref 0.0–0.5)
Eosinophils Relative: 6 %
HCT: 32.3 % — ABNORMAL LOW (ref 36.0–46.0)
Hemoglobin: 9.9 g/dL — ABNORMAL LOW (ref 12.0–15.0)
Immature Granulocytes: 0 %
Lymphocytes Relative: 28 %
Lymphs Abs: 2 10*3/uL (ref 0.7–4.0)
MCH: 29 pg (ref 26.0–34.0)
MCHC: 30.7 g/dL (ref 30.0–36.0)
MCV: 94.7 fL (ref 80.0–100.0)
Monocytes Absolute: 0.4 10*3/uL (ref 0.1–1.0)
Monocytes Relative: 5 %
Neutro Abs: 4.4 10*3/uL (ref 1.7–7.7)
Neutrophils Relative %: 61 %
Platelets: 275 10*3/uL (ref 150–400)
RBC: 3.41 MIL/uL — ABNORMAL LOW (ref 3.87–5.11)
RDW: 15.8 % — ABNORMAL HIGH (ref 11.5–15.5)
WBC: 7.2 10*3/uL (ref 4.0–10.5)
nRBC: 0 % (ref 0.0–0.2)

## 2021-08-22 MED ORDER — HEPARIN SOD (PORK) LOCK FLUSH 100 UNIT/ML IV SOLN
INTRAVENOUS | Status: AC
  Filled 2021-08-22: qty 5

## 2021-08-22 MED ORDER — HEPARIN SOD (PORK) LOCK FLUSH 100 UNIT/ML IV SOLN
500.0000 [IU] | Freq: Once | INTRAVENOUS | Status: AC
  Administered 2021-08-22: 500 [IU] via INTRAVENOUS
  Filled 2021-08-22: qty 5

## 2021-08-22 NOTE — Progress Notes (Signed)
Pt here for new tx. Pt is currenlty on antibiotics for UTI and will take last dose today.
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
## 2021-08-26 ENCOUNTER — Other Ambulatory Visit: Payer: Self-pay | Admitting: Oncology

## 2021-08-26 ENCOUNTER — Ambulatory Visit
Admission: RE | Admit: 2021-08-26 | Discharge: 2021-08-26 | Disposition: A | Discharge status: Home / Self Care | Payer: Medicare Other | Source: Ambulatory Visit | Attending: Oncology | Admitting: Oncology

## 2021-08-26 ENCOUNTER — Other Ambulatory Visit: Payer: Self-pay

## 2021-08-26 ENCOUNTER — Encounter: Payer: Self-pay | Admitting: Oncology

## 2021-08-26 ENCOUNTER — Telehealth: Payer: Self-pay | Admitting: *Deleted

## 2021-08-26 DIAGNOSIS — Z01818 Encounter for other preprocedural examination: Secondary | ICD-10-CM | POA: Insufficient documentation

## 2021-08-26 DIAGNOSIS — I4891 Unspecified atrial fibrillation: Secondary | ICD-10-CM | POA: Insufficient documentation

## 2021-08-26 DIAGNOSIS — I10 Essential (primary) hypertension: Secondary | ICD-10-CM | POA: Diagnosis not present

## 2021-08-26 DIAGNOSIS — J449 Chronic obstructive pulmonary disease, unspecified: Secondary | ICD-10-CM | POA: Insufficient documentation

## 2021-08-26 DIAGNOSIS — C50919 Malignant neoplasm of unspecified site of unspecified female breast: Secondary | ICD-10-CM | POA: Insufficient documentation

## 2021-08-26 DIAGNOSIS — Z0189 Encounter for other specified special examinations: Secondary | ICD-10-CM | POA: Diagnosis not present

## 2021-08-26 DIAGNOSIS — F17200 Nicotine dependence, unspecified, uncomplicated: Secondary | ICD-10-CM | POA: Insufficient documentation

## 2021-08-26 LAB — ECHOCARDIOGRAM COMPLETE
AR max vel: 3.27 cm2
AV Area VTI: 3.45 cm2
AV Area mean vel: 2.73 cm2
AV Mean grad: 2 mmHg
AV Peak grad: 4 mmHg
Ao pk vel: 1 m/s
Area-P 1/2: 5.27 cm2
MV VTI: 3.35 cm2
S' Lateral: 3 cm

## 2021-08-26 NOTE — Telephone Encounter (Signed)
Call returned to opt and advised to contact Dr Ginette Pitman for this problem. She agreed and said she will call him

## 2021-08-26 NOTE — Telephone Encounter (Addendum)
Patient called reporting that she has a worsening rash since 08/09/21. She had been to see Dr Ginette Pitman for breathing issue and was started on new medicine for her breathing which caused the initial rash and was changed to a new medicine because of rash, The rash continued and got worse and she stopped the medicine last week, but the rash is now from head to toes including the bottoms of her feet. She describes the rash as whelps, not blistery, but it is very itchy and she states she has been taking Benadryl and it is not helping. I asked what Dr Ginette Pitman has said about this rash and she said "nothing really" She is asking if we can help her. She has not started chemotherapy or radiation therapy yet Please advise

## 2021-08-26 NOTE — Progress Notes (Signed)
*  PRELIMINARY RESULTS* Echocardiogram 2D Echocardiogram has been performed.  Debra [name] 08/26/2021, 10:41 AM

## 2021-08-28 ENCOUNTER — Ambulatory Visit
Admission: RE | Admit: 2021-08-28 | Discharge: 2021-08-28 | Disposition: A | Discharge status: Home / Self Care | Payer: Medicare Other | Source: Ambulatory Visit | Attending: Radiation Oncology | Admitting: Radiation Oncology

## 2021-08-28 DIAGNOSIS — C50211 Malignant neoplasm of upper-inner quadrant of right female breast: Secondary | ICD-10-CM | POA: Insufficient documentation

## 2021-08-28 DIAGNOSIS — Z510 Encounter for antineoplastic radiation therapy: Secondary | ICD-10-CM | POA: Insufficient documentation

## 2021-08-28 DIAGNOSIS — Z5111 Encounter for antineoplastic chemotherapy: Secondary | ICD-10-CM | POA: Diagnosis not present

## 2021-08-29 DIAGNOSIS — Z5111 Encounter for antineoplastic chemotherapy: Secondary | ICD-10-CM | POA: Diagnosis not present

## 2021-09-02 NOTE — Progress Notes (Signed)
Pharmacist Chemotherapy Monitoring - Initial Assessment    Anticipated start date: 09/09/21   The following has been reviewed per standard work regarding the patient's treatment regimen: The patient's diagnosis, treatment plan and drug doses, and organ/hematologic function Lab orders and baseline tests specific to treatment regimen  The treatment plan start date, drug sequencing, and pre-medications Prior authorization status  Patient's documented medication list, including drug-drug interaction screen and prescriptions for anti-emetics and supportive care specific to the treatment regimen The drug concentrations, fluid compatibility, administration routes, and timing of the medications to be used The patient's access for treatment and lifetime cumulative dose history, if applicable  The patient's medication allergies and previous infusion related reactions, if applicable   Changes made to treatment plan:  treatment plan date  Follow up needed:  signing treatment plan   Debra [name], [place], 09/02/2021  8:41 AM

## 2021-09-04 ENCOUNTER — Ambulatory Visit: Payer: Medicare Other

## 2021-09-05 ENCOUNTER — Other Ambulatory Visit: Payer: Self-pay | Admitting: Oncology

## 2021-09-05 ENCOUNTER — Ambulatory Visit: Payer: Medicare Other

## 2021-09-05 NOTE — Telephone Encounter (Signed)
°  Component Ref Range & Units 2 wk ago (08/22/21) 4 wk ago (08/08/21) 2 mo ago (07/01/21) 2 mo ago (06/26/21) 2 mo ago (06/24/21) 2 mo ago (06/13/21) 3 mo ago (06/03/21)  Potassium 3.5 - 5.1 mmol/L 3.9  4.2  3.9  4.1  4.2  3.9  4.3

## 2021-09-06 ENCOUNTER — Ambulatory Visit: Payer: Medicare Other

## 2021-09-06 ENCOUNTER — Inpatient Hospital Stay (HOSPITAL_BASED_OUTPATIENT_CLINIC_OR_DEPARTMENT_OTHER): Payer: Medicare Other | Admitting: Nurse Practitioner

## 2021-09-06 ENCOUNTER — Other Ambulatory Visit: Payer: Self-pay

## 2021-09-06 ENCOUNTER — Ambulatory Visit: Admission: RE | Admit: 2021-09-06 | Payer: Medicare Other | Source: Ambulatory Visit

## 2021-09-06 ENCOUNTER — Inpatient Hospital Stay: Payer: Medicare Other

## 2021-09-06 VITALS — BP 127/69 | HR 114 | Temp 97.4°F | Resp 17 | Wt 172.0 lb

## 2021-09-06 DIAGNOSIS — C50919 Malignant neoplasm of unspecified site of unspecified female breast: Secondary | ICD-10-CM

## 2021-09-06 DIAGNOSIS — R634 Abnormal weight loss: Secondary | ICD-10-CM | POA: Diagnosis not present

## 2021-09-06 DIAGNOSIS — Z5111 Encounter for antineoplastic chemotherapy: Secondary | ICD-10-CM | POA: Diagnosis not present

## 2021-09-06 LAB — CBC WITH DIFFERENTIAL/PLATELET
Abs Immature Granulocytes: 0.01 10*3/uL (ref 0.00–0.07)
Basophils Absolute: 0 10*3/uL (ref 0.0–0.1)
Basophils Relative: 0 %
Eosinophils Absolute: 0.4 10*3/uL (ref 0.0–0.5)
Eosinophils Relative: 6 %
HCT: 35.9 % — ABNORMAL LOW (ref 36.0–46.0)
Hemoglobin: 11.2 g/dL — ABNORMAL LOW (ref 12.0–15.0)
Immature Granulocytes: 0 %
Lymphocytes Relative: 23 %
Lymphs Abs: 1.6 10*3/uL (ref 0.7–4.0)
MCH: 28.7 pg (ref 26.0–34.0)
MCHC: 31.2 g/dL (ref 30.0–36.0)
MCV: 92.1 fL (ref 80.0–100.0)
Monocytes Absolute: 0.4 10*3/uL (ref 0.1–1.0)
Monocytes Relative: 5 %
Neutro Abs: 4.5 10*3/uL (ref 1.7–7.7)
Neutrophils Relative %: 66 %
Platelets: 351 10*3/uL (ref 150–400)
RBC: 3.9 MIL/uL (ref 3.87–5.11)
RDW: 15.2 % (ref 11.5–15.5)
WBC: 7 10*3/uL (ref 4.0–10.5)
nRBC: 0 % (ref 0.0–0.2)

## 2021-09-06 LAB — COMPREHENSIVE METABOLIC PANEL
ALT: 14 U/L (ref 0–44)
AST: 25 U/L (ref 15–41)
Albumin: 4 g/dL (ref 3.5–5.0)
Alkaline Phosphatase: 84 U/L (ref 38–126)
Anion gap: 10 (ref 5–15)
BUN: 12 mg/dL (ref 8–23)
CO2: 32 mmol/L (ref 22–32)
Calcium: 9.3 mg/dL (ref 8.9–10.3)
Chloride: 96 mmol/L — ABNORMAL LOW (ref 98–111)
Creatinine, Ser: 0.96 mg/dL (ref 0.44–1.00)
GFR, Estimated: >60 mL/min (ref >60)
Glucose, Bld: 103 mg/dL — ABNORMAL HIGH (ref 70–99)
Potassium: 3.4 mmol/L — ABNORMAL LOW (ref 3.5–5.1)
Sodium: 138 mmol/L (ref 135–145)
Total Bilirubin: 0.5 mg/dL (ref 0.3–1.2)
Total Protein: 7.7 g/dL (ref 6.5–8.1)

## 2021-09-06 NOTE — Progress Notes (Signed)
Pt reports weakness, fatigue, and nausea for the past 2-3 days. She denies nausea, vomiting, fever, chills, or diarrhea. Pt states "I just don't feel good". Appetite has remained good. She has used her prn antiemetics, which she states help, but then the nausea will return.
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
## 2021-09-09 ENCOUNTER — Inpatient Hospital Stay: Payer: Medicare Other

## 2021-09-09 ENCOUNTER — Encounter: Payer: Self-pay | Admitting: Oncology

## 2021-09-09 ENCOUNTER — Ambulatory Visit
Admission: RE | Admit: 2021-09-09 | Discharge: 2021-09-09 | Disposition: A | Discharge status: Home / Self Care | Payer: Medicare Other | Source: Ambulatory Visit | Attending: Radiation Oncology | Admitting: Radiation Oncology

## 2021-09-09 ENCOUNTER — Inpatient Hospital Stay (HOSPITAL_BASED_OUTPATIENT_CLINIC_OR_DEPARTMENT_OTHER): Payer: Medicare Other | Admitting: Oncology

## 2021-09-09 ENCOUNTER — Other Ambulatory Visit: Payer: Self-pay

## 2021-09-09 VITALS — BP 115/71 | HR 98 | Temp 98.2°F | Resp 14 | Wt 174.0 lb

## 2021-09-09 DIAGNOSIS — Z5111 Encounter for antineoplastic chemotherapy: Secondary | ICD-10-CM

## 2021-09-09 DIAGNOSIS — M7989 Other specified soft tissue disorders: Secondary | ICD-10-CM

## 2021-09-09 DIAGNOSIS — D6481 Anemia due to antineoplastic chemotherapy: Secondary | ICD-10-CM

## 2021-09-09 DIAGNOSIS — C50919 Malignant neoplasm of unspecified site of unspecified female breast: Secondary | ICD-10-CM | POA: Diagnosis not present

## 2021-09-09 DIAGNOSIS — T451X5A Adverse effect of antineoplastic and immunosuppressive drugs, initial encounter: Secondary | ICD-10-CM | POA: Diagnosis not present

## 2021-09-09 LAB — CBC WITH DIFFERENTIAL/PLATELET
Abs Immature Granulocytes: 0.02 10*3/uL (ref 0.00–0.07)
Basophils Absolute: 0 10*3/uL (ref 0.0–0.1)
Basophils Relative: 1 %
Eosinophils Absolute: 0.5 10*3/uL (ref 0.0–0.5)
Eosinophils Relative: 7 %
HCT: 34.5 % — ABNORMAL LOW (ref 36.0–46.0)
Hemoglobin: 10.7 g/dL — ABNORMAL LOW (ref 12.0–15.0)
Immature Granulocytes: 0 %
Lymphocytes Relative: 29 %
Lymphs Abs: 2.2 10*3/uL (ref 0.7–4.0)
MCH: 29.2 pg (ref 26.0–34.0)
MCHC: 31 g/dL (ref 30.0–36.0)
MCV: 94 fL (ref 80.0–100.0)
Monocytes Absolute: 0.5 10*3/uL (ref 0.1–1.0)
Monocytes Relative: 7 %
Neutro Abs: 4.4 10*3/uL (ref 1.7–7.7)
Neutrophils Relative %: 56 %
Platelets: 300 10*3/uL (ref 150–400)
RBC: 3.67 MIL/uL — ABNORMAL LOW (ref 3.87–5.11)
RDW: 15.1 % (ref 11.5–15.5)
WBC: 7.8 10*3/uL (ref 4.0–10.5)
nRBC: 0 % (ref 0.0–0.2)

## 2021-09-09 LAB — COMPREHENSIVE METABOLIC PANEL
ALT: 11 U/L (ref 0–44)
AST: 19 U/L (ref 15–41)
Albumin: 3.7 g/dL (ref 3.5–5.0)
Alkaline Phosphatase: 76 U/L (ref 38–126)
Anion gap: 12 (ref 5–15)
BUN: 14 mg/dL (ref 8–23)
CO2: 32 mmol/L (ref 22–32)
Calcium: 9.3 mg/dL (ref 8.9–10.3)
Chloride: 92 mmol/L — ABNORMAL LOW (ref 98–111)
Creatinine, Ser: 0.94 mg/dL (ref 0.44–1.00)
GFR, Estimated: >60 mL/min (ref >60)
Glucose, Bld: 105 mg/dL — ABNORMAL HIGH (ref 70–99)
Potassium: 3.8 mmol/L (ref 3.5–5.1)
Sodium: 136 mmol/L (ref 135–145)
Total Bilirubin: 0.3 mg/dL (ref 0.3–1.2)
Total Protein: 7.4 g/dL (ref 6.5–8.1)

## 2021-09-09 MED ORDER — ACETAMINOPHEN 325 MG PO TABS
650.0000 mg | ORAL_TABLET | Freq: Once | ORAL | Status: AC
  Administered 2021-09-09: 650 mg via ORAL
  Filled 2021-09-09: qty 2

## 2021-09-09 MED ORDER — HEPARIN SOD (PORK) LOCK FLUSH 100 UNIT/ML IV SOLN
500.0000 [IU] | Freq: Once | INTRAVENOUS | Status: AC | PRN
  Administered 2021-09-09: 500 [IU]
  Filled 2021-09-09: qty 5

## 2021-09-09 MED ORDER — SODIUM CHLORIDE 0.9 % IV SOLN
3.6000 mg/kg | Freq: Once | INTRAVENOUS | Status: AC
  Administered 2021-09-09: 300 mg via INTRAVENOUS
  Filled 2021-09-09: qty 15

## 2021-09-09 MED ORDER — SODIUM CHLORIDE 0.9 % IV SOLN
Freq: Once | INTRAVENOUS | Status: AC
  Filled 2021-09-09: qty 250

## 2021-09-09 MED ORDER — DIPHENHYDRAMINE HCL 25 MG PO CAPS
50.0000 mg | ORAL_CAPSULE | Freq: Once | ORAL | Status: AC
  Administered 2021-09-09: 50 mg via ORAL
  Filled 2021-09-09: qty 2

## 2021-09-09 NOTE — Patient Instructions (Signed)
[facility] AT [place]  Discharge Instructions: Thank you for choosing [facility] to provide your oncology and hematology care.  If you have a lab appointment with the [facility], please go directly to the [facility] and check in at the registration area.  Wear comfortable clothing and clothing appropriate for easy access to any Portacath or PICC line.   We strive to give you quality time with your provider. You may need to reschedule your appointment if you arrive late (15 or more minutes).  Arriving late affects you and other patients whose appointments are after yours.  Also, if you miss three or more appointments without notifying the office, you may be dismissed from the clinic at the providers discretion.      For prescription refill requests, have your pharmacy contact our office and allow 72 hours for refills to be completed.    Today you received the following chemotherapy and/or immunotherapy agents : Kadcyla   Ado-Trastuzumab Emtansine for injection What is this medication? ADO-TRASTUZUMAB EMTANSINE (ADD oh traz TOO zuh mab em TAN zine) is a monoclonal antibody combined with chemotherapy. It is used to treat breast cancer. This medicine may be used for other purposes; ask your health care provider or pharmacist if you have questions. COMMON BRAND NAME(S): Kadcyla What should I tell my care team before I take this medication? They need to know if you have any of these conditions: heart disease heart failure infection (especially a virus infection such as chickenpox, cold sores, or herpes) liver disease lung or breathing disease, like asthma tingling of the fingers or toes, or other nerve disorder an unusual or allergic reaction to ado-trastuzumab emtansine, other medications, foods, dyes, or preservatives pregnant or trying to get pregnant breast-feeding How should I use this medication? This medicine is for infusion into a vein. It  is given by a health care professional in a hospital or clinic setting. Talk to your pediatrician regarding the use of this medicine in children. Special care may be needed. Overdosage: If you think you have taken too much of this medicine contact a poison control center or emergency room at once. NOTE: This medicine is only for you. Do not share this medicine with others. What if I miss a dose? It is important not to miss your dose. Call your doctor or health care professional if you are unable to keep an appointment. What may interact with this medication? This medicine may also interact with the following medications: atazanavir boceprevir clarithromycin delavirdine indinavir dalfopristin; quinupristin isoniazid, INH itraconazole ketoconazole nefazodone nelfinavir ritonavir telaprevir telithromycin tipranavir voriconazole This list may not describe all possible interactions. Give your health care provider a list of all the medicines, herbs, non-prescription drugs, or dietary supplements you use. Also tell them if you smoke, drink alcohol, or use illegal drugs. Some items may interact with your medicine. What should I watch for while using this medication? Visit your doctor for checks on your progress. This drug may make you feel generally unwell. This is not uncommon, as chemotherapy can affect healthy cells as well as cancer cells. Report any side effects. Continue your course of treatment even though you feel ill unless your doctor tells you to stop. You may need blood work done while you are taking this medicine. Call your doctor or health care professional for advice if you get a fever, chills or sore throat, or other symptoms of a cold or flu. Do not treat yourself. This drug decreases your  body's ability to fight infections. Try to avoid being around people who are sick. Be careful brushing and flossing your teeth or using a toothpick because you may get an infection or bleed  more easily. If you have any dental work done, tell your dentist you are receiving this medicine. Avoid taking products that contain aspirin, acetaminophen, ibuprofen, naproxen, or ketoprofen unless instructed by your doctor. These medicines may hide a fever. Do not become pregnant while taking this medicine or for 7 months after stopping it, men with female partners should use contraception during treatment and for 4 months after the last dose. Women should inform their doctor if they wish to become pregnant or think they might be pregnant. There is a potential for serious side effects to an unborn child. Do not breast-feed an infant while taking this medicine or for 7 months after the last dose. Men who have a partner who is pregnant or who is capable of becoming pregnant should use a condom during sexual activity while taking this medicine and for 4 months after stopping it. Men should inform their doctors if they wish to father a child. This medicine may lower sperm counts. Talk to your health care professional or pharmacist for more information. What side effects may I notice from receiving this medication? Side effects that you should report to your doctor or health care professional as soon as possible: allergic reactions like skin rash, itching or hives, swelling of the face, lips, or tongue breathing problems chest pain or palpitations fever or chills, sore throat general ill feeling or flu-like symptoms light-colored stools nausea, vomiting pain, tingling, numbness in the hands or feet signs and symptoms of bleeding such as bloody or black, tarry stools; red or dark-brown urine; spitting up blood or brown material that looks like coffee grounds; red spots on the skin; unusual bruising or bleeding from the eye, gums, or nose swelling of the legs or ankles yellowing of the eyes or skin Side effects that usually do not require medical attention (report to your doctor or health care  professional if they continue or are bothersome): changes in taste constipation dizziness headache joint pain muscle pain trouble sleeping unusually weak or tired This list may not describe all possible side effects. Call your doctor for medical advice about side effects. You may report side effects to FDA at 1-800-FDA-1088. Where should I keep my medication? This drug is given in a hospital or clinic and will not be stored at home. NOTE: This sheet is a summary. It may not cover all possible information. If you have questions about this medicine, talk to your doctor, pharmacist, or health care provider.  2022 [employer]vier/Gold Standard (2018-08-24 00:00:00)    To help prevent nausea and vomiting after your treatment, we encourage you to take your nausea medication as directed.  BELOW ARE SYMPTOMS THAT SHOULD BE REPORTED IMMEDIATELY: *FEVER GREATER THAN 100.4 F (38 C) OR HIGHER *CHILLS OR SWEATING *NAUSEA AND VOMITING THAT IS NOT CONTROLLED WITH YOUR NAUSEA MEDICATION *UNUSUAL SHORTNESS OF BREATH *UNUSUAL BRUISING OR BLEEDING *URINARY PROBLEMS (pain or burning when urinating, or frequent urination) *BOWEL PROBLEMS (unusual diarrhea, constipation, pain near the anus) TENDERNESS IN MOUTH AND THROAT WITH OR WITHOUT PRESENCE OF ULCERS (sore throat, sores in mouth, or a toothache) UNUSUAL RASH, SWELLING OR PAIN  UNUSUAL VAGINAL DISCHARGE OR ITCHING   Items with * indicate a potential emergency and should be followed up as soon as possible or go to the Emergency Department if any problems  should occur.  Please show the CHEMOTHERAPY ALERT CARD or IMMUNOTHERAPY ALERT CARD at check-in to the Emergency Department and triage nurse.  Should you have questions after your visit or need to cancel or reschedule your appointment, please contact [facility] [place] AT [place]  [phone] and follow the prompts.  Office hours are 8:00 a.m. to 4:30 p.m. Monday - Friday. Please note that  voicemails left after 4:00 p.m. may not be returned until the following business day.  We are closed weekends and major holidays. You have access to a nurse at all times for urgent questions. Please call the main number to the clinic [phone] and follow the prompts.  For any non-urgent questions, you may also contact your provider using MyChart. We now offer e-Visits for anyone 56 and older to request care online for non-urgent symptoms. For details visit [email or web address].   Also download the MyChart app! Go to the app store, search "MyChart", open the app, select [facility], and log in with your MyChart username and password.  Due to Covid, a mask is required upon entering the hospital/clinic. If you do not have a mask, one will be given to you upon arrival. For doctor visits, patients may have 1 support person aged 22 or older with them. For treatment visits, patients cannot have anyone with them due to current Covid guidelines and our immunocompromised population.

## 2021-09-09 NOTE — Progress Notes (Signed)
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 Kathlyn Sacramento MD Signature Date/Time: 08/26/2021/11:03:32 AM    Final    MM RT RADIO FREQUENCY TAG LOC MAMMO GUIDE  Result Date: 07/18/2021 CLINICAL DATA:  RIGHT breast invasive mammary carcinoma, COIL clip. EXAM: MAMMOGRAPHIC GUIDED  RADIOFREQUENCY DEVICE LOCALIZATION OF THE RIGHT BREAST COMPARISON:  Previous exam(s) FINDINGS: Patient presents for radiofrequency device localization prior to RIGHT breast lumpectomy. I met with the patient and we discussed the procedure of radiofrequency device localization including benefits and alternatives. We discussed the high likelihood of a successful procedure. We discussed the risks of the procedure including infection, bleeding, tissue injury and further surgery. Informed, written consent was given. The usual time-out protocol was performed immediately prior to the procedure. Using mammographic guidance, sterile technique, 1% lidocaine as local anesthesia, a radiofrequency tag (RF ID [id number]) was used to localize the COIL clip using a medial approach. The follow-up mammogram images  confirm that the RF device is in the expected location and are marked for Dr. Windell Moment. The patient tolerated the procedure well and was [facility]. IMPRESSION: Radiofrequency device localization of the RIGHT breast. No apparent complications. Electronically Signed   By: Valentino Saxon M.D.   On: 07/18/2021 16:55       ASSESSMENT & PLAN:  1. 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 Antineoplastic chemotherapy induced anemia   3. Encounter for antineoplastic chemotherapy   4. Swelling of both lower extremities    Cancer Staging  Invasive carcinoma of breast ([place]) Staging form: Breast, AJCC 8th Edition - Clinical stage from 01/18/2021: Stage IB (cT2, cN0, cM0, G1, ER+, PR+, HER2+) - Signed by Earlie Server, MD on 03/04/2021  #Right breast cT1c cN0 invasive carcinoma, ER/PR positive, HER2 positive tumor size appears larger, 2.5cm,  T2  on MRI,  Baseline normal CA 27-29, CEA 15-3 S/p TCH x2, and TCHP x4 followed by lumpectomy and sentinel lymph node biopsy. ypT1a ypN0 Residual disease after neoadjuvant chemotherapy.   Labs are reviewed and discussed with patient Will proceed with cycle 1 Kadcyla today. Follow-up with radiation for adjuvant radiation.  #Chemotherapy-induced anemia, monitor.  Anemia has improved.  Close to her baseline.  #right lower extremity swelling, negative for DVT.  Lasix 20 mg daily as needed for edema. #Chronic respiratory failure continue nasal cannula oxygen. Stable.    All questions were answered. The patient knows to call the clinic with any problems questions or concerns.  cc Debra Harrier, MD   RTC : 3 weeks for lab MD Kadcyla.  Earlie Server, MD, PhD 09/09/2021

## 2021-09-09 NOTE — Progress Notes (Signed)
Pt waited 90 min observation after 1st Kadcyla. Completed without incident

## 2021-09-09 NOTE — Progress Notes (Signed)
Pt and husband in for follow up, denies any concerns today.

## 2021-09-10 ENCOUNTER — Ambulatory Visit
Admission: RE | Admit: 2021-09-10 | Discharge: 2021-09-10 | Disposition: A | Discharge status: Home / Self Care | Payer: Medicare Other | Source: Ambulatory Visit | Attending: Radiation Oncology | Admitting: Radiation Oncology

## 2021-09-10 DIAGNOSIS — Z5111 Encounter for antineoplastic chemotherapy: Secondary | ICD-10-CM | POA: Diagnosis not present

## 2021-09-11 ENCOUNTER — Ambulatory Visit: Payer: Medicare Other

## 2021-09-12 ENCOUNTER — Ambulatory Visit: Payer: Medicare Other

## 2021-09-12 ENCOUNTER — Ambulatory Visit: Admission: RE | Admit: 2021-09-12 | Payer: Medicare Other | Source: Ambulatory Visit

## 2021-09-13 ENCOUNTER — Ambulatory Visit: Payer: Medicare Other

## 2021-09-16 ENCOUNTER — Ambulatory Visit
Admission: RE | Admit: 2021-09-16 | Discharge: 2021-09-16 | Disposition: A | Discharge status: Home / Self Care | Payer: Medicare Other | Source: Ambulatory Visit | Attending: Radiation Oncology | Admitting: Radiation Oncology

## 2021-09-16 DIAGNOSIS — Z5111 Encounter for antineoplastic chemotherapy: Secondary | ICD-10-CM | POA: Diagnosis not present

## 2021-09-17 ENCOUNTER — Ambulatory Visit
Admission: RE | Admit: 2021-09-17 | Discharge: 2021-09-17 | Disposition: A | Discharge status: Home / Self Care | Payer: Medicare Other | Source: Ambulatory Visit | Attending: Radiation Oncology | Admitting: Radiation Oncology

## 2021-09-17 DIAGNOSIS — Z5111 Encounter for antineoplastic chemotherapy: Secondary | ICD-10-CM | POA: Diagnosis not present

## 2021-09-18 ENCOUNTER — Ambulatory Visit
Admission: RE | Admit: 2021-09-18 | Discharge: 2021-09-18 | Disposition: A | Discharge status: Home / Self Care | Payer: Medicare Other | Source: Ambulatory Visit | Attending: Radiation Oncology | Admitting: Radiation Oncology

## 2021-09-18 DIAGNOSIS — D6481 Anemia due to antineoplastic chemotherapy: Secondary | ICD-10-CM | POA: Diagnosis not present

## 2021-09-18 DIAGNOSIS — J449 Chronic obstructive pulmonary disease, unspecified: Secondary | ICD-10-CM | POA: Diagnosis not present

## 2021-09-18 DIAGNOSIS — R634 Abnormal weight loss: Secondary | ICD-10-CM | POA: Diagnosis not present

## 2021-09-18 DIAGNOSIS — Z87891 Personal history of nicotine dependence: Secondary | ICD-10-CM | POA: Diagnosis not present

## 2021-09-18 DIAGNOSIS — Z5112 Encounter for antineoplastic immunotherapy: Secondary | ICD-10-CM | POA: Diagnosis present

## 2021-09-18 DIAGNOSIS — Z510 Encounter for antineoplastic radiation therapy: Secondary | ICD-10-CM | POA: Insufficient documentation

## 2021-09-18 DIAGNOSIS — Z170 Estrogen receptor positive status [ER+]: Secondary | ICD-10-CM | POA: Diagnosis not present

## 2021-09-18 DIAGNOSIS — C50211 Malignant neoplasm of upper-inner quadrant of right female breast: Secondary | ICD-10-CM | POA: Insufficient documentation

## 2021-09-18 DIAGNOSIS — J9610 Chronic respiratory failure, unspecified whether with hypoxia or hypercapnia: Secondary | ICD-10-CM | POA: Diagnosis not present

## 2021-09-18 DIAGNOSIS — Z8051 Family history of malignant neoplasm of kidney: Secondary | ICD-10-CM | POA: Diagnosis not present

## 2021-09-18 DIAGNOSIS — Z79899 Other long term (current) drug therapy: Secondary | ICD-10-CM | POA: Diagnosis not present

## 2021-09-18 DIAGNOSIS — Z801 Family history of malignant neoplasm of trachea, bronchus and lung: Secondary | ICD-10-CM | POA: Diagnosis not present

## 2021-09-18 DIAGNOSIS — R000 Tachycardia, unspecified: Secondary | ICD-10-CM | POA: Diagnosis not present

## 2021-09-18 DIAGNOSIS — Z808 Family history of malignant neoplasm of other organs or systems: Secondary | ICD-10-CM | POA: Diagnosis not present

## 2021-09-18 DIAGNOSIS — Z803 Family history of malignant neoplasm of breast: Secondary | ICD-10-CM | POA: Diagnosis not present

## 2021-09-18 DIAGNOSIS — M7989 Other specified soft tissue disorders: Secondary | ICD-10-CM | POA: Diagnosis not present

## 2021-09-18 DIAGNOSIS — T451X5A Adverse effect of antineoplastic and immunosuppressive drugs, initial encounter: Secondary | ICD-10-CM | POA: Diagnosis not present

## 2021-09-19 ENCOUNTER — Inpatient Hospital Stay: Payer: Medicare Other | Attending: Oncology

## 2021-09-19 ENCOUNTER — Other Ambulatory Visit: Payer: Self-pay | Admitting: Oncology

## 2021-09-19 ENCOUNTER — Inpatient Hospital Stay (HOSPITAL_BASED_OUTPATIENT_CLINIC_OR_DEPARTMENT_OTHER): Payer: Medicare Other | Admitting: Nurse Practitioner

## 2021-09-19 ENCOUNTER — Inpatient Hospital Stay: Payer: Medicare Other

## 2021-09-19 ENCOUNTER — Other Ambulatory Visit: Payer: Self-pay

## 2021-09-19 ENCOUNTER — Encounter: Payer: Self-pay | Admitting: Nurse Practitioner

## 2021-09-19 ENCOUNTER — Ambulatory Visit
Admission: RE | Admit: 2021-09-19 | Discharge: 2021-09-19 | Disposition: A | Discharge status: Home / Self Care | Payer: Medicare Other | Source: Ambulatory Visit | Attending: Radiation Oncology | Admitting: Radiation Oncology

## 2021-09-19 VITALS — BP 127/93 | HR 100 | Temp 99.1°F | Resp 16 | Wt 173.7 lb

## 2021-09-19 DIAGNOSIS — R000 Tachycardia, unspecified: Secondary | ICD-10-CM | POA: Insufficient documentation

## 2021-09-19 DIAGNOSIS — Z8051 Family history of malignant neoplasm of kidney: Secondary | ICD-10-CM | POA: Insufficient documentation

## 2021-09-19 DIAGNOSIS — C50919 Malignant neoplasm of unspecified site of unspecified female breast: Secondary | ICD-10-CM | POA: Diagnosis not present

## 2021-09-19 DIAGNOSIS — Z5111 Encounter for antineoplastic chemotherapy: Secondary | ICD-10-CM

## 2021-09-19 DIAGNOSIS — Z170 Estrogen receptor positive status [ER+]: Secondary | ICD-10-CM | POA: Insufficient documentation

## 2021-09-19 DIAGNOSIS — C50211 Malignant neoplasm of upper-inner quadrant of right female breast: Secondary | ICD-10-CM | POA: Insufficient documentation

## 2021-09-19 DIAGNOSIS — Z87891 Personal history of nicotine dependence: Secondary | ICD-10-CM | POA: Insufficient documentation

## 2021-09-19 DIAGNOSIS — M7989 Other specified soft tissue disorders: Secondary | ICD-10-CM | POA: Insufficient documentation

## 2021-09-19 DIAGNOSIS — Z5112 Encounter for antineoplastic immunotherapy: Secondary | ICD-10-CM | POA: Diagnosis not present

## 2021-09-19 DIAGNOSIS — Z79899 Other long term (current) drug therapy: Secondary | ICD-10-CM | POA: Insufficient documentation

## 2021-09-19 DIAGNOSIS — J449 Chronic obstructive pulmonary disease, unspecified: Secondary | ICD-10-CM | POA: Insufficient documentation

## 2021-09-19 DIAGNOSIS — T451X5A Adverse effect of antineoplastic and immunosuppressive drugs, initial encounter: Secondary | ICD-10-CM | POA: Insufficient documentation

## 2021-09-19 DIAGNOSIS — J9610 Chronic respiratory failure, unspecified whether with hypoxia or hypercapnia: Secondary | ICD-10-CM | POA: Insufficient documentation

## 2021-09-19 DIAGNOSIS — R634 Abnormal weight loss: Secondary | ICD-10-CM | POA: Insufficient documentation

## 2021-09-19 DIAGNOSIS — Z801 Family history of malignant neoplasm of trachea, bronchus and lung: Secondary | ICD-10-CM | POA: Insufficient documentation

## 2021-09-19 DIAGNOSIS — D6481 Anemia due to antineoplastic chemotherapy: Secondary | ICD-10-CM | POA: Insufficient documentation

## 2021-09-19 DIAGNOSIS — E860 Dehydration: Secondary | ICD-10-CM

## 2021-09-19 DIAGNOSIS — Z803 Family history of malignant neoplasm of breast: Secondary | ICD-10-CM | POA: Insufficient documentation

## 2021-09-19 DIAGNOSIS — Z808 Family history of malignant neoplasm of other organs or systems: Secondary | ICD-10-CM | POA: Insufficient documentation

## 2021-09-19 DIAGNOSIS — Z510 Encounter for antineoplastic radiation therapy: Secondary | ICD-10-CM | POA: Insufficient documentation

## 2021-09-19 LAB — CBC WITH DIFFERENTIAL/PLATELET
Abs Immature Granulocytes: 0.01 10*3/uL (ref 0.00–0.07)
Basophils Absolute: 0 10*3/uL (ref 0.0–0.1)
Basophils Relative: 0 %
Eosinophils Absolute: 0.3 10*3/uL (ref 0.0–0.5)
Eosinophils Relative: 4 %
HCT: 33.3 % — ABNORMAL LOW (ref 36.0–46.0)
Hemoglobin: 10.4 g/dL — ABNORMAL LOW (ref 12.0–15.0)
Immature Granulocytes: 0 %
Lymphocytes Relative: 31 %
Lymphs Abs: 2.2 10*3/uL (ref 0.7–4.0)
MCH: 28.3 pg (ref 26.0–34.0)
MCHC: 31.2 g/dL (ref 30.0–36.0)
MCV: 90.5 fL (ref 80.0–100.0)
Monocytes Absolute: 0.6 10*3/uL (ref 0.1–1.0)
Monocytes Relative: 9 %
Neutro Abs: 4 10*3/uL (ref 1.7–7.7)
Neutrophils Relative %: 56 %
Platelets: 169 10*3/uL (ref 150–400)
RBC: 3.68 MIL/uL — ABNORMAL LOW (ref 3.87–5.11)
RDW: 14.9 % (ref 11.5–15.5)
WBC: 7.2 10*3/uL (ref 4.0–10.5)
nRBC: 0 % (ref 0.0–0.2)

## 2021-09-19 LAB — COMPREHENSIVE METABOLIC PANEL
ALT: 13 U/L (ref 0–44)
AST: 22 U/L (ref 15–41)
Albumin: 3.6 g/dL (ref 3.5–5.0)
Alkaline Phosphatase: 90 U/L (ref 38–126)
Anion gap: 4 — ABNORMAL LOW (ref 5–15)
BUN: 11 mg/dL (ref 8–23)
CO2: 33 mmol/L — ABNORMAL HIGH (ref 22–32)
Calcium: 9.1 mg/dL (ref 8.9–10.3)
Chloride: 98 mmol/L (ref 98–111)
Creatinine, Ser: 0.92 mg/dL (ref 0.44–1.00)
GFR, Estimated: >60 mL/min (ref >60)
Glucose, Bld: 90 mg/dL (ref 70–99)
Potassium: 3.5 mmol/L (ref 3.5–5.1)
Sodium: 135 mmol/L (ref 135–145)
Total Bilirubin: 0.4 mg/dL (ref 0.3–1.2)
Total Protein: 7.5 g/dL (ref 6.5–8.1)

## 2021-09-19 MED ORDER — SODIUM CHLORIDE 0.9% FLUSH
10.0000 mL | Freq: Once | INTRAVENOUS | Status: AC
  Administered 2021-09-19: 10 mL via INTRAVENOUS
  Filled 2021-09-19: qty 10

## 2021-09-19 MED ORDER — SODIUM CHLORIDE 0.9 % IV SOLN
INTRAVENOUS | Status: DC
  Filled 2021-09-19: qty 250

## 2021-09-19 MED ORDER — HEPARIN SOD (PORK) LOCK FLUSH 100 UNIT/ML IV SOLN
500.0000 [IU] | Freq: Once | INTRAVENOUS | Status: AC
  Administered 2021-09-19: 500 [IU] via INTRAVENOUS
  Filled 2021-09-19: qty 5

## 2021-09-19 NOTE — Progress Notes (Signed)
Pt and husband in for follow up and states "I feel fine today".

## 2021-09-19 NOTE — Progress Notes (Signed)
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 m MV E velocity: 60.00 cm/s MV A velocity: 78.40 cm/s MV E/A ratio:  0.77 Kathlyn Sacramento MD Electronically signed by Kathlyn Sacramento MD  Signature Date/Time: 08/26/2021/11:03:32 AM    Final    MM RT RADIO FREQUENCY TAG LOC MAMMO GUIDE  Result Date: 07/18/2021 CLINICAL DATA:  RIGHT breast invasive mammary carcinoma, COIL clip. EXAM: MAMMOGRAPHIC GUIDED RADIOFREQUENCY DEVICE LOCALIZATION OF THE RIGHT BREAST COMPARISON:  Previous exam(s) FINDINGS: Patient presents for radiofrequency device localization prior to RIGHT breast lumpectomy. I met with the patient and we discussed the procedure of radiofrequency device localization including benefits and alternatives. We discussed the high likelihood of a successful procedure. We discussed the risks of the procedure including infection, bleeding, tissue injury and further surgery. Informed, written consent was given. The usual time-out protocol was performed immediately prior to the procedure. Using mammographic guidance, sterile technique, 1% lidocaine as local anesthesia,  a radiofrequency tag (RF ID [id number]) was used to localize the COIL clip using a medial approach. The follow-up mammogram images confirm that the RF device is in the expected location and are marked for Dr. Windell Moment. The patient tolerated the procedure well and was released from the [facility]. IMPRESSION: Radiofrequency device localization of the RIGHT breast. No apparent complications. Electronically Signed   By: Valentino Saxon M.D.   On: 07/18/2021 16:55       ASSESSMENT & PLAN:  No diagnosis found.  Cancer Staging  Invasive carcinoma of breast ([place]) Staging form: Breast, AJCC 8th Edition - Clinical stage from 01/18/2021: Stage IB (cT2, cN0, cM0, G1, ER+, PR+, HER2+) - Signed by Earlie Server, MD on 03/04/2021   #Right breast cT1c cN0 invasive carcinoma, ER/PR positive, HER2 positive tumor size appears larger, 2.5cm,  T2  on MRI, Baseline normal CA 27-29, CEA 15-3 S/p TCH x2, and TCHP x4 followed by lumpectomy and sentinel lymph node biopsy. ypT1a ypN0. Residual disease after neoadjuvant chemotherapy. S/p cycle 1 of Kadcyla. She's started adjuvant radiation. Tolerating both well. Hold IV fluids today  #Chemotherapy-induced anemia, monitor.  Anemia is stable to improved. Normocytic. Monitor.   #right lower extremity swelling, negative for DVT previously. On lasix. Monitor.   #Chronic respiratory failure- continue nasal cannula oxygen. Stable.    All questions were answered. The patient knows to call the clinic with any problems questions or concerns.  cc [name], MD   RTC : follow up with Dr. Tasia Catchings as scheduled for treatment. RTC as needed in interim.   Beckey Rutter, DNP, AGNP-C [facility] [phone] (clinic) 09/19/2021

## 2021-09-20 ENCOUNTER — Ambulatory Visit
Admission: RE | Admit: 2021-09-20 | Discharge: 2021-09-20 | Disposition: A | Discharge status: Home / Self Care | Payer: Medicare Other | Source: Ambulatory Visit | Attending: Radiation Oncology | Admitting: Radiation Oncology

## 2021-09-20 DIAGNOSIS — Z5112 Encounter for antineoplastic immunotherapy: Secondary | ICD-10-CM | POA: Diagnosis not present

## 2021-09-21 ENCOUNTER — Encounter: Payer: Self-pay | Admitting: Oncology

## 2021-09-23 ENCOUNTER — Ambulatory Visit
Admission: RE | Admit: 2021-09-23 | Discharge: 2021-09-23 | Disposition: A | Discharge status: Home / Self Care | Payer: Medicare Other | Source: Ambulatory Visit | Attending: Radiation Oncology | Admitting: Radiation Oncology

## 2021-09-23 DIAGNOSIS — Z5112 Encounter for antineoplastic immunotherapy: Secondary | ICD-10-CM | POA: Diagnosis not present

## 2021-09-24 ENCOUNTER — Ambulatory Visit
Admission: RE | Admit: 2021-09-24 | Discharge: 2021-09-24 | Disposition: A | Discharge status: Home / Self Care | Payer: Medicare Other | Source: Ambulatory Visit | Attending: Radiation Oncology | Admitting: Radiation Oncology

## 2021-09-24 DIAGNOSIS — Z5112 Encounter for antineoplastic immunotherapy: Secondary | ICD-10-CM | POA: Diagnosis not present

## 2021-09-25 ENCOUNTER — Ambulatory Visit
Admission: RE | Admit: 2021-09-25 | Discharge: 2021-09-25 | Disposition: A | Discharge status: Home / Self Care | Payer: Medicare Other | Source: Ambulatory Visit | Attending: Radiation Oncology | Admitting: Radiation Oncology

## 2021-09-25 DIAGNOSIS — Z5112 Encounter for antineoplastic immunotherapy: Secondary | ICD-10-CM | POA: Diagnosis not present

## 2021-09-26 ENCOUNTER — Ambulatory Visit
Admission: RE | Admit: 2021-09-26 | Discharge: 2021-09-26 | Disposition: A | Discharge status: Home / Self Care | Payer: Medicare Other | Source: Ambulatory Visit | Attending: Radiation Oncology | Admitting: Radiation Oncology

## 2021-09-26 DIAGNOSIS — Z5112 Encounter for antineoplastic immunotherapy: Secondary | ICD-10-CM | POA: Diagnosis not present

## 2021-09-27 ENCOUNTER — Ambulatory Visit
Admission: RE | Admit: 2021-09-27 | Discharge: 2021-09-27 | Disposition: A | Discharge status: Home / Self Care | Payer: Medicare Other | Source: Ambulatory Visit | Attending: Radiation Oncology | Admitting: Radiation Oncology

## 2021-09-27 DIAGNOSIS — Z5112 Encounter for antineoplastic immunotherapy: Secondary | ICD-10-CM | POA: Diagnosis not present

## 2021-09-30 ENCOUNTER — Inpatient Hospital Stay: Payer: Medicare Other | Admitting: Oncology

## 2021-09-30 ENCOUNTER — Encounter: Payer: Self-pay | Admitting: Oncology

## 2021-09-30 ENCOUNTER — Other Ambulatory Visit: Payer: Self-pay

## 2021-09-30 ENCOUNTER — Inpatient Hospital Stay: Payer: Medicare Other

## 2021-09-30 ENCOUNTER — Ambulatory Visit
Admission: RE | Admit: 2021-09-30 | Discharge: 2021-09-30 | Disposition: A | Discharge status: Home / Self Care | Payer: Medicare Other | Source: Ambulatory Visit | Attending: Radiation Oncology | Admitting: Radiation Oncology

## 2021-09-30 VITALS — BP 107/84 | HR 86 | Resp 18

## 2021-09-30 VITALS — BP 116/80 | HR 108 | Temp 97.8°F | Wt 169.9 lb

## 2021-09-30 DIAGNOSIS — Z5111 Encounter for antineoplastic chemotherapy: Secondary | ICD-10-CM

## 2021-09-30 DIAGNOSIS — D6481 Anemia due to antineoplastic chemotherapy: Secondary | ICD-10-CM | POA: Diagnosis not present

## 2021-09-30 DIAGNOSIS — C50919 Malignant neoplasm of unspecified site of unspecified female breast: Secondary | ICD-10-CM | POA: Diagnosis not present

## 2021-09-30 DIAGNOSIS — T451X5A Adverse effect of antineoplastic and immunosuppressive drugs, initial encounter: Secondary | ICD-10-CM

## 2021-09-30 DIAGNOSIS — Z5112 Encounter for antineoplastic immunotherapy: Secondary | ICD-10-CM | POA: Diagnosis not present

## 2021-09-30 LAB — COMPREHENSIVE METABOLIC PANEL
ALT: 14 U/L (ref 0–44)
AST: 30 U/L (ref 15–41)
Albumin: 3.5 g/dL (ref 3.5–5.0)
Alkaline Phosphatase: 79 U/L (ref 38–126)
Anion gap: 11 (ref 5–15)
BUN: 12 mg/dL (ref 8–23)
CO2: 30 mmol/L (ref 22–32)
Calcium: 9.2 mg/dL (ref 8.9–10.3)
Chloride: 95 mmol/L — ABNORMAL LOW (ref 98–111)
Creatinine, Ser: 0.94 mg/dL (ref 0.44–1.00)
GFR, Estimated: >60 mL/min (ref >60)
Glucose, Bld: 128 mg/dL — ABNORMAL HIGH (ref 70–99)
Potassium: 3.2 mmol/L — ABNORMAL LOW (ref 3.5–5.1)
Sodium: 136 mmol/L (ref 135–145)
Total Bilirubin: <0.1 mg/dL — ABNORMAL LOW (ref 0.3–1.2)
Total Protein: 7.5 g/dL (ref 6.5–8.1)

## 2021-09-30 LAB — CBC WITH DIFFERENTIAL/PLATELET
Abs Immature Granulocytes: 0.01 10*3/uL (ref 0.00–0.07)
Basophils Absolute: 0.1 10*3/uL (ref 0.0–0.1)
Basophils Relative: 1 %
Eosinophils Absolute: 0.3 10*3/uL (ref 0.0–0.5)
Eosinophils Relative: 5 %
HCT: 35.4 % — ABNORMAL LOW (ref 36.0–46.0)
Hemoglobin: 10.8 g/dL — ABNORMAL LOW (ref 12.0–15.0)
Immature Granulocytes: 0 %
Lymphocytes Relative: 40 %
Lymphs Abs: 2.5 10*3/uL (ref 0.7–4.0)
MCH: 27.7 pg (ref 26.0–34.0)
MCHC: 30.5 g/dL (ref 30.0–36.0)
MCV: 90.8 fL (ref 80.0–100.0)
Monocytes Absolute: 0.5 10*3/uL (ref 0.1–1.0)
Monocytes Relative: 8 %
Neutro Abs: 2.8 10*3/uL (ref 1.7–7.7)
Neutrophils Relative %: 46 %
Platelets: 327 10*3/uL (ref 150–400)
RBC: 3.9 MIL/uL (ref 3.87–5.11)
RDW: 14.7 % (ref 11.5–15.5)
WBC: 6.1 10*3/uL (ref 4.0–10.5)
nRBC: 0 % (ref 0.0–0.2)

## 2021-09-30 MED ORDER — ACETAMINOPHEN 325 MG PO TABS
650.0000 mg | ORAL_TABLET | Freq: Once | ORAL | Status: AC
  Administered 2021-09-30: 650 mg via ORAL
  Filled 2021-09-30: qty 2

## 2021-09-30 MED ORDER — HEPARIN SOD (PORK) LOCK FLUSH 100 UNIT/ML IV SOLN
500.0000 [IU] | Freq: Once | INTRAVENOUS | Status: AC | PRN
  Administered 2021-09-30: 500 [IU]
  Filled 2021-09-30: qty 5

## 2021-09-30 MED ORDER — SODIUM CHLORIDE 0.9 % IV SOLN
Freq: Once | INTRAVENOUS | Status: AC
  Filled 2021-09-30: qty 250

## 2021-09-30 MED ORDER — DIPHENHYDRAMINE HCL 25 MG PO CAPS
50.0000 mg | ORAL_CAPSULE | Freq: Once | ORAL | Status: AC
  Administered 2021-09-30: 50 mg via ORAL
  Filled 2021-09-30: qty 2

## 2021-09-30 MED ORDER — POTASSIUM CHLORIDE CRYS ER 20 MEQ PO TBCR: 20.0000 meq | EXTENDED_RELEASE_TABLET | Freq: Two times a day (BID) | ORAL | 0 refills | Status: DC

## 2021-09-30 MED ORDER — POTASSIUM CHLORIDE 20 MEQ/100ML IV SOLN
20.0000 meq | Freq: Once | INTRAVENOUS | Status: AC
  Administered 2021-09-30: 20 meq via INTRAVENOUS

## 2021-09-30 MED ORDER — SODIUM CHLORIDE 0.9 % IV SOLN
3.6000 mg/kg | Freq: Once | INTRAVENOUS | Status: AC
  Administered 2021-09-30: 300 mg via INTRAVENOUS
  Filled 2021-09-30: qty 15

## 2021-09-30 NOTE — Patient Instructions (Signed)
[facility]  Discharge Instructions: °Thank you for choosing [facility] to provide your oncology and hematology care.  ° °If you have a lab appointment with the Cancer Center, please go directly to the Cancer Center and check in at the registration area. °  °Wear comfortable clothing and clothing appropriate for easy access to any Portacath or PICC line.  ° °We strive to give you quality time with your provider. You may need to reschedule your appointment if you arrive late (15 or more minutes).  Arriving late affects you and other patients whose appointments are after yours.  Also, if you miss three or more appointments without notifying the office, you may be dismissed from the clinic at the provider’s discretion.    °  °For prescription refill requests, have your pharmacy contact our office and allow 72 hours for refills to be completed.   ° °Today you received the following chemotherapy and/or immunotherapy agents     °  °To help prevent nausea and vomiting after your treatment, we encourage you to take your nausea medication as directed. ° °BELOW ARE SYMPTOMS THAT SHOULD BE REPORTED IMMEDIATELY: °*FEVER GREATER THAN 100.4 F (38 °C) OR HIGHER °*CHILLS OR SWEATING °*NAUSEA AND VOMITING THAT IS NOT CONTROLLED WITH YOUR NAUSEA MEDICATION °*UNUSUAL SHORTNESS OF BREATH °*UNUSUAL BRUISING OR BLEEDING °*URINARY PROBLEMS (pain or burning when urinating, or frequent urination) °*BOWEL PROBLEMS (unusual diarrhea, constipation, pain near the anus) °TENDERNESS IN MOUTH AND THROAT WITH OR WITHOUT PRESENCE OF ULCERS (sore throat, sores in mouth, or a toothache) °UNUSUAL RASH, SWELLING OR PAIN  °UNUSUAL VAGINAL DISCHARGE OR ITCHING  ° °Items with * indicate a potential emergency and should be followed up as soon as possible or go to the Emergency Department if any problems should occur. ° °Please show the CHEMOTHERAPY ALERT CARD or IMMUNOTHERAPY ALERT CARD at check-in to the  Emergency Department and triage nurse. ° °Should you have questions after your visit or need to cancel or reschedule your appointment, please contact [facility]  Dept: [phone]  and follow the prompts.  Office hours are 8:00 a.m. to 4:30 p.m. Monday - Friday. Please note that voicemails left after 4:00 p.m. may not be returned until the following business day.  We are closed weekends and major holidays. You have access to a nurse at all times for urgent questions. Please call the main number to the clinic Dept: [phone] and follow the prompts. ° ° °For any non-urgent questions, you may also contact your provider using MyChart. We now offer e-Visits for anyone 18 and older to request care online for non-urgent symptoms. For details visit [email or web address]. °  °Also download the MyChart app! Go to the app store, search "MyChart", open the app, select [facility], and log in with your MyChart username and password. ° °Due to Covid, a mask is required upon entering the hospital/clinic. If you do not have a mask, one will be given to you upon arrival. For doctor visits, patients may have 1 support person aged 68 or older with them. For treatment visits, patients cannot have anyone with them due to current Covid guidelines and our immunocompromised population.  ° °

## 2021-09-30 NOTE — Progress Notes (Signed)
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 larger, 2.5cm,  T2  on MRI,  Baseline normal CA 27-29, CEA 15-3 S/p TCH x2, and TCHP x4 followed by  lumpectomy and sentinel lymph node biopsy. ypT1a ypN0 Residual disease after neoadjuvant chemotherapy.   Labs reviewed and discussed with patient. Proceed with cycle 2 Kadcyla today. Continue breast radiation. With her multiple medical problems, ongoing radiation, I will postpone her cycle 3 Kadcyla until she is about to finish radiation.   #Chemotherapy-induced anemia, monitor.  Hemoglobin is 10.8.  Monitor for At #right lower extremity swelling, negative for DVT.  Lasix 20 mg daily as needed for edema. #Chronic respiratory failure continue nasal cannula oxygen. Stable.   All questions were answered. The patient knows to call the clinic with any problems questions or concerns.  cc [name], MD   RTC : 5 weeks for lab MD Kadcyla.  Earlie Server, MD, PhD 09/30/2021

## 2021-10-01 ENCOUNTER — Ambulatory Visit
Admission: RE | Admit: 2021-10-01 | Discharge: 2021-10-01 | Disposition: A | Discharge status: Home / Self Care | Payer: Medicare Other | Source: Ambulatory Visit | Attending: Radiation Oncology | Admitting: Radiation Oncology

## 2021-10-01 DIAGNOSIS — Z5112 Encounter for antineoplastic immunotherapy: Secondary | ICD-10-CM | POA: Diagnosis not present

## 2021-10-02 ENCOUNTER — Ambulatory Visit
Admission: RE | Admit: 2021-10-02 | Discharge: 2021-10-02 | Disposition: A | Discharge status: Home / Self Care | Payer: Medicare Other | Source: Ambulatory Visit | Attending: Radiation Oncology | Admitting: Radiation Oncology

## 2021-10-02 DIAGNOSIS — Z5112 Encounter for antineoplastic immunotherapy: Secondary | ICD-10-CM | POA: Diagnosis not present

## 2021-10-03 ENCOUNTER — Ambulatory Visit: Payer: Medicare Other

## 2021-10-04 ENCOUNTER — Ambulatory Visit: Payer: Medicare Other

## 2021-10-07 ENCOUNTER — Ambulatory Visit
Admission: RE | Admit: 2021-10-07 | Discharge: 2021-10-07 | Disposition: A | Discharge status: Home / Self Care | Payer: Medicare Other | Source: Ambulatory Visit | Attending: Radiation Oncology | Admitting: Radiation Oncology

## 2021-10-07 DIAGNOSIS — Z5112 Encounter for antineoplastic immunotherapy: Secondary | ICD-10-CM | POA: Diagnosis not present

## 2021-10-08 ENCOUNTER — Ambulatory Visit
Admission: RE | Admit: 2021-10-08 | Discharge: 2021-10-08 | Disposition: A | Discharge status: Home / Self Care | Payer: Medicare Other | Source: Ambulatory Visit | Attending: Radiation Oncology | Admitting: Radiation Oncology

## 2021-10-08 ENCOUNTER — Other Ambulatory Visit: Payer: Self-pay | Admitting: *Deleted

## 2021-10-08 DIAGNOSIS — Z5112 Encounter for antineoplastic immunotherapy: Secondary | ICD-10-CM | POA: Diagnosis not present

## 2021-10-08 MED ORDER — SILVER SULFADIAZINE 1 % EX CREA: 1.0000 "application " | TOPICAL_CREAM | Freq: Two times a day (BID) | CUTANEOUS | 3 refills | Status: DC

## 2021-10-09 ENCOUNTER — Ambulatory Visit
Admission: RE | Admit: 2021-10-09 | Discharge: 2021-10-09 | Disposition: A | Discharge status: Home / Self Care | Payer: Medicare Other | Source: Ambulatory Visit | Attending: Radiation Oncology | Admitting: Radiation Oncology

## 2021-10-09 DIAGNOSIS — Z5112 Encounter for antineoplastic immunotherapy: Secondary | ICD-10-CM | POA: Diagnosis not present

## 2021-10-10 ENCOUNTER — Ambulatory Visit
Admission: RE | Admit: 2021-10-10 | Discharge: 2021-10-10 | Disposition: A | Discharge status: Home / Self Care | Payer: Medicare Other | Source: Ambulatory Visit | Attending: Radiation Oncology | Admitting: Radiation Oncology

## 2021-10-10 ENCOUNTER — Ambulatory Visit: Payer: Medicare Other

## 2021-10-10 DIAGNOSIS — Z5112 Encounter for antineoplastic immunotherapy: Secondary | ICD-10-CM | POA: Diagnosis not present

## 2021-10-11 ENCOUNTER — Ambulatory Visit
Admission: RE | Admit: 2021-10-11 | Discharge: 2021-10-11 | Disposition: A | Discharge status: Home / Self Care | Payer: Medicare Other | Source: Ambulatory Visit | Attending: Radiation Oncology | Admitting: Radiation Oncology

## 2021-10-11 DIAGNOSIS — Z5112 Encounter for antineoplastic immunotherapy: Secondary | ICD-10-CM | POA: Diagnosis not present

## 2021-10-14 ENCOUNTER — Inpatient Hospital Stay: Payer: Medicare Other

## 2021-10-14 ENCOUNTER — Inpatient Hospital Stay: Payer: Medicare Other | Admitting: Nurse Practitioner

## 2021-10-14 ENCOUNTER — Encounter: Payer: Self-pay | Admitting: Nurse Practitioner

## 2021-10-14 ENCOUNTER — Other Ambulatory Visit: Payer: Self-pay

## 2021-10-14 ENCOUNTER — Ambulatory Visit
Admission: RE | Admit: 2021-10-14 | Discharge: 2021-10-14 | Disposition: A | Discharge status: Home / Self Care | Payer: Medicare Other | Source: Ambulatory Visit | Attending: Radiation Oncology | Admitting: Radiation Oncology

## 2021-10-14 VITALS — BP 100/72 | HR 118 | Temp 97.8°F | Wt 166.6 lb

## 2021-10-14 DIAGNOSIS — C50919 Malignant neoplasm of unspecified site of unspecified female breast: Secondary | ICD-10-CM

## 2021-10-14 DIAGNOSIS — E46 Unspecified protein-calorie malnutrition: Secondary | ICD-10-CM

## 2021-10-14 DIAGNOSIS — T451X5A Adverse effect of antineoplastic and immunosuppressive drugs, initial encounter: Secondary | ICD-10-CM | POA: Diagnosis not present

## 2021-10-14 DIAGNOSIS — Z5112 Encounter for antineoplastic immunotherapy: Secondary | ICD-10-CM | POA: Diagnosis not present

## 2021-10-14 DIAGNOSIS — D6481 Anemia due to antineoplastic chemotherapy: Secondary | ICD-10-CM

## 2021-10-14 LAB — CBC WITH DIFFERENTIAL/PLATELET
Abs Immature Granulocytes: 0.03 10*3/uL (ref 0.00–0.07)
Basophils Absolute: 0 10*3/uL (ref 0.0–0.1)
Basophils Relative: 0 %
Eosinophils Absolute: 0.2 10*3/uL (ref 0.0–0.5)
Eosinophils Relative: 2 %
HCT: 37.3 % (ref 36.0–46.0)
Hemoglobin: 11.5 g/dL — ABNORMAL LOW (ref 12.0–15.0)
Immature Granulocytes: 0 %
Lymphocytes Relative: 16 %
Lymphs Abs: 1.8 10*3/uL (ref 0.7–4.0)
MCH: 27.6 pg (ref 26.0–34.0)
MCHC: 30.8 g/dL (ref 30.0–36.0)
MCV: 89.7 fL (ref 80.0–100.0)
Monocytes Absolute: 0.7 10*3/uL (ref 0.1–1.0)
Monocytes Relative: 7 %
Neutro Abs: 8.2 10*3/uL — ABNORMAL HIGH (ref 1.7–7.7)
Neutrophils Relative %: 75 %
Platelets: 258 10*3/uL (ref 150–400)
RBC: 4.16 MIL/uL (ref 3.87–5.11)
RDW: 14.7 % (ref 11.5–15.5)
WBC: 10.9 10*3/uL — ABNORMAL HIGH (ref 4.0–10.5)
nRBC: 0 % (ref 0.0–0.2)

## 2021-10-14 LAB — COMPREHENSIVE METABOLIC PANEL
ALT: 17 U/L (ref 0–44)
AST: 21 U/L (ref 15–41)
Albumin: 3.8 g/dL (ref 3.5–5.0)
Alkaline Phosphatase: 88 U/L (ref 38–126)
Anion gap: 8 (ref 5–15)
BUN: 17 mg/dL (ref 8–23)
CO2: 31 mmol/L (ref 22–32)
Calcium: 9.4 mg/dL (ref 8.9–10.3)
Chloride: 97 mmol/L — ABNORMAL LOW (ref 98–111)
Creatinine, Ser: 1.03 mg/dL — ABNORMAL HIGH (ref 0.44–1.00)
GFR, Estimated: 60 mL/min — ABNORMAL LOW (ref >60)
Glucose, Bld: 116 mg/dL — ABNORMAL HIGH (ref 70–99)
Potassium: 4 mmol/L (ref 3.5–5.1)
Sodium: 136 mmol/L (ref 135–145)
Total Bilirubin: 0.2 mg/dL — ABNORMAL LOW (ref 0.3–1.2)
Total Protein: 8.2 g/dL — ABNORMAL HIGH (ref 6.5–8.1)

## 2021-10-14 NOTE — Progress Notes (Signed)
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 normal CA  27-29, CEA 15-3, S/p [place] x2, and TCHP x4 followed by lumpectomy and sentinel lymph node biopsy. ypT1a ypN0. Residual disease after neoadjuvant chemotherapy.  Now s/p cycle 2 of Kadcyla. Cycle 3 held until she nearly completes radiation d/t moderate to poor tolerance of concurrent administration.   #Chemotherapy-induced anemia- Hemoglobin 11.5. Stable. Monitor.   # Right lower extremity swelling- negative for DVT.  Lasix 20 mg daily as needed for edema.  #Chronic respiratory failure- continue nasal cannula oxygen. Stable.   #Neutrophilia- ANC 8.2. Afebrile. Feels well. Monitor.   #Weight loss- encouraged high calorie and protein intake. Encouraged fluid intake.   #Tachycardia- increase fluid intake. Declines IV fluids today.   Continue radiation as planned. She will follow up with Dr. Tasia Catchings for labs and consideration of Kadcyla on 11/04/21 as scheduled. Return to clinic in the interim as needed.   All questions were answered. The patient knows to call the clinic with any problems questions or concerns.  Beckey Rutter, DNP, AGNP-C [place] at [facility] [phone] (clinic) 10/14/2021  cc [name], MD

## 2021-10-15 ENCOUNTER — Other Ambulatory Visit: Payer: Self-pay | Admitting: Oncology

## 2021-10-15 ENCOUNTER — Ambulatory Visit
Admission: RE | Admit: 2021-10-15 | Discharge: 2021-10-15 | Disposition: A | Discharge status: Home / Self Care | Payer: Medicare Other | Source: Ambulatory Visit | Attending: Radiation Oncology | Admitting: Radiation Oncology

## 2021-10-15 ENCOUNTER — Ambulatory Visit: Payer: Medicare Other

## 2021-10-15 ENCOUNTER — Encounter: Payer: Self-pay | Admitting: Oncology

## 2021-10-15 DIAGNOSIS — Z5112 Encounter for antineoplastic immunotherapy: Secondary | ICD-10-CM | POA: Diagnosis not present

## 2021-10-16 ENCOUNTER — Encounter: Payer: Self-pay | Admitting: Oncology

## 2021-10-16 ENCOUNTER — Other Ambulatory Visit: Payer: Self-pay | Admitting: General Surgery

## 2021-10-16 ENCOUNTER — Ambulatory Visit: Payer: Medicare Other

## 2021-10-16 ENCOUNTER — Ambulatory Visit
Admission: RE | Admit: 2021-10-16 | Discharge: 2021-10-16 | Disposition: A | Discharge status: Home / Self Care | Payer: Medicare Other | Source: Ambulatory Visit | Attending: Radiation Oncology | Admitting: Radiation Oncology

## 2021-10-16 DIAGNOSIS — Z853 Personal history of malignant neoplasm of breast: Secondary | ICD-10-CM

## 2021-10-16 DIAGNOSIS — Z5112 Encounter for antineoplastic immunotherapy: Secondary | ICD-10-CM | POA: Diagnosis not present

## 2021-10-16 DIAGNOSIS — D6481 Anemia due to antineoplastic chemotherapy: Secondary | ICD-10-CM | POA: Diagnosis not present

## 2021-10-16 DIAGNOSIS — C50211 Malignant neoplasm of upper-inner quadrant of right female breast: Secondary | ICD-10-CM | POA: Diagnosis present

## 2021-10-16 DIAGNOSIS — Z170 Estrogen receptor positive status [ER+]: Secondary | ICD-10-CM | POA: Diagnosis not present

## 2021-10-16 DIAGNOSIS — Z79899 Other long term (current) drug therapy: Secondary | ICD-10-CM | POA: Diagnosis not present

## 2021-10-16 DIAGNOSIS — Z510 Encounter for antineoplastic radiation therapy: Secondary | ICD-10-CM | POA: Insufficient documentation

## 2021-10-17 ENCOUNTER — Ambulatory Visit: Payer: Medicare Other

## 2021-10-17 ENCOUNTER — Ambulatory Visit
Admission: RE | Admit: 2021-10-17 | Discharge: 2021-10-17 | Disposition: A | Discharge status: Home / Self Care | Payer: Medicare Other | Source: Ambulatory Visit | Attending: Radiation Oncology | Admitting: Radiation Oncology

## 2021-10-17 DIAGNOSIS — Z510 Encounter for antineoplastic radiation therapy: Secondary | ICD-10-CM | POA: Diagnosis not present

## 2021-10-18 ENCOUNTER — Ambulatory Visit: Payer: Medicare Other

## 2021-10-18 ENCOUNTER — Other Ambulatory Visit: Payer: Self-pay

## 2021-10-18 ENCOUNTER — Ambulatory Visit
Admission: RE | Admit: 2021-10-18 | Discharge: 2021-10-18 | Disposition: A | Discharge status: Home / Self Care | Payer: Medicare Other | Source: Ambulatory Visit | Attending: Radiation Oncology | Admitting: Radiation Oncology

## 2021-10-18 ENCOUNTER — Inpatient Hospital Stay: Payer: Medicare Other

## 2021-10-18 DIAGNOSIS — Z79899 Other long term (current) drug therapy: Secondary | ICD-10-CM | POA: Insufficient documentation

## 2021-10-18 DIAGNOSIS — Z170 Estrogen receptor positive status [ER+]: Secondary | ICD-10-CM | POA: Insufficient documentation

## 2021-10-18 DIAGNOSIS — Z510 Encounter for antineoplastic radiation therapy: Secondary | ICD-10-CM | POA: Diagnosis not present

## 2021-10-18 DIAGNOSIS — Z5112 Encounter for antineoplastic immunotherapy: Secondary | ICD-10-CM | POA: Insufficient documentation

## 2021-10-18 DIAGNOSIS — D6481 Anemia due to antineoplastic chemotherapy: Secondary | ICD-10-CM | POA: Insufficient documentation

## 2021-10-18 DIAGNOSIS — C50211 Malignant neoplasm of upper-inner quadrant of right female breast: Secondary | ICD-10-CM | POA: Insufficient documentation

## 2021-10-18 NOTE — Progress Notes (Signed)
Nutrition Follow-up: ? ?Patient identified on Malnutrition Screening report for weight loss.   ? ?RD last saw patient on 06/05/21. ? ?Patient with triple negative breast cancer.  Patient receiving radiation at this time, [place] is on hold until after radiation.  ? ?Called and spoke with patient via phone.  Reports that appetite is decreased some while on radiation.  Reports that she is still eating 3 meals per day.  Daughter is bringing her a biscuit for breakfast this am.  Charlette Caffey is usually a sandwich or hamburger.  Supper last night was [place] food (noodles/vegetables/egg rolls).  Drinks boost at times.  Has some nausea from time to time but takes nausea medication with relief.   ? ? ?Medications: reviewed ? ?Labs: reviewed ? ?Anthropometrics:  ? ?Weight 166 lb 9.6 oz on 2/27 ? ?183 lb 4.8 oz on 10/17 ? ?9% weight loss in the last 4 months, concerning ? ?NUTRITION DIAGNOSIS: Inadequate oral intake continues ? ? ?INTERVENTION:  ?Encouraged patient to drink boost daily with weight loss and poor appetite ?Encouraged patient to continue to eat good sources of protein daily ?Contact information provided to patient and patient will call RD if needed in the future ? ? ? ?NEXT VISIT: no follow-up ?Patient will call RD if needed ? ?Marja Adderley B. Zenia Resides, RD, LDN ?Registered Dietitian ?[phone] (mobile) ? ? ?

## 2021-10-21 ENCOUNTER — Ambulatory Visit
Admission: RE | Admit: 2021-10-21 | Discharge: 2021-10-21 | Disposition: A | Discharge status: Home / Self Care | Payer: Medicare Other | Source: Ambulatory Visit | Attending: Radiation Oncology | Admitting: Radiation Oncology

## 2021-10-21 ENCOUNTER — Ambulatory Visit: Payer: Medicare Other

## 2021-10-21 DIAGNOSIS — Z510 Encounter for antineoplastic radiation therapy: Secondary | ICD-10-CM | POA: Diagnosis not present

## 2021-10-22 ENCOUNTER — Ambulatory Visit: Payer: Medicare Other

## 2021-10-23 ENCOUNTER — Ambulatory Visit: Payer: Medicare Other

## 2021-10-23 ENCOUNTER — Other Ambulatory Visit: Payer: Self-pay | Admitting: Oncology

## 2021-10-23 NOTE — Telephone Encounter (Signed)
?  Component Ref Range & Units 9 d ago ?(10/14/21) 3 wk ago ?(09/30/21) 1 mo ago ?(09/19/21) 1 mo ago ?(09/09/21) 1 mo ago ?(09/06/21) 2 mo ago ?(08/22/21) 2 mo ago ?(08/08/21)  ?Potassium 3.5 - 5.1 mmol/L 4.0  3.2 Low   3.5  3.8  3.4 Low   3.9  4.2   ?  ? ?

## 2021-10-24 ENCOUNTER — Ambulatory Visit: Payer: Medicare Other

## 2021-10-24 ENCOUNTER — Ambulatory Visit
Admission: RE | Admit: 2021-10-24 | Discharge: 2021-10-24 | Disposition: A | Discharge status: Home / Self Care | Payer: Medicare Other | Source: Ambulatory Visit | Attending: Radiation Oncology | Admitting: Radiation Oncology

## 2021-10-24 DIAGNOSIS — Z510 Encounter for antineoplastic radiation therapy: Secondary | ICD-10-CM | POA: Diagnosis not present

## 2021-10-25 ENCOUNTER — Ambulatory Visit: Payer: Medicare Other

## 2021-10-25 DIAGNOSIS — Z510 Encounter for antineoplastic radiation therapy: Secondary | ICD-10-CM | POA: Diagnosis not present

## 2021-10-28 ENCOUNTER — Ambulatory Visit: Payer: Medicare Other

## 2021-10-28 DIAGNOSIS — Z510 Encounter for antineoplastic radiation therapy: Secondary | ICD-10-CM | POA: Diagnosis not present

## 2021-10-29 ENCOUNTER — Ambulatory Visit: Payer: Medicare Other

## 2021-10-29 ENCOUNTER — Ambulatory Visit
Admission: RE | Admit: 2021-10-29 | Discharge: 2021-10-29 | Disposition: A | Discharge status: Home / Self Care | Payer: Medicare Other | Source: Ambulatory Visit | Attending: Radiation Oncology | Admitting: Radiation Oncology

## 2021-10-29 DIAGNOSIS — Z510 Encounter for antineoplastic radiation therapy: Secondary | ICD-10-CM | POA: Diagnosis not present

## 2021-10-30 ENCOUNTER — Ambulatory Visit: Payer: Medicare Other

## 2021-10-31 ENCOUNTER — Ambulatory Visit: Payer: Medicare Other

## 2021-11-01 ENCOUNTER — Ambulatory Visit: Payer: Medicare Other

## 2021-11-01 DIAGNOSIS — Z510 Encounter for antineoplastic radiation therapy: Secondary | ICD-10-CM | POA: Diagnosis not present

## 2021-11-04 ENCOUNTER — Inpatient Hospital Stay: Payer: Medicare Other | Admitting: Oncology

## 2021-11-04 ENCOUNTER — Inpatient Hospital Stay: Payer: Medicare Other

## 2021-11-04 ENCOUNTER — Encounter: Payer: Self-pay | Admitting: Oncology

## 2021-11-04 ENCOUNTER — Other Ambulatory Visit: Payer: Self-pay

## 2021-11-04 ENCOUNTER — Ambulatory Visit
Admission: RE | Admit: 2021-11-04 | Discharge: 2021-11-04 | Disposition: A | Discharge status: Home / Self Care | Payer: Medicare Other | Source: Ambulatory Visit | Attending: Radiation Oncology | Admitting: Radiation Oncology

## 2021-11-04 VITALS — BP 135/75 | HR 75

## 2021-11-04 VITALS — BP 135/73 | HR 97 | Temp 97.0°F | Wt 167.0 lb

## 2021-11-04 DIAGNOSIS — Z510 Encounter for antineoplastic radiation therapy: Secondary | ICD-10-CM | POA: Diagnosis not present

## 2021-11-04 DIAGNOSIS — C50919 Malignant neoplasm of unspecified site of unspecified female breast: Secondary | ICD-10-CM

## 2021-11-04 DIAGNOSIS — T451X5A Adverse effect of antineoplastic and immunosuppressive drugs, initial encounter: Secondary | ICD-10-CM

## 2021-11-04 DIAGNOSIS — D6481 Anemia due to antineoplastic chemotherapy: Secondary | ICD-10-CM | POA: Diagnosis not present

## 2021-11-04 DIAGNOSIS — Z5111 Encounter for antineoplastic chemotherapy: Secondary | ICD-10-CM

## 2021-11-04 LAB — COMPREHENSIVE METABOLIC PANEL
ALT: 12 U/L (ref 0–44)
AST: 24 U/L (ref 15–41)
Albumin: 3.6 g/dL (ref 3.5–5.0)
Alkaline Phosphatase: 82 U/L (ref 38–126)
Anion gap: 10 (ref 5–15)
BUN: 17 mg/dL (ref 8–23)
CO2: 28 mmol/L (ref 22–32)
Calcium: 9 mg/dL (ref 8.9–10.3)
Chloride: 98 mmol/L (ref 98–111)
Creatinine, Ser: 0.73 mg/dL (ref 0.44–1.00)
GFR, Estimated: >60 mL/min (ref >60)
Glucose, Bld: 127 mg/dL — ABNORMAL HIGH (ref 70–99)
Potassium: 4.3 mmol/L (ref 3.5–5.1)
Sodium: 136 mmol/L (ref 135–145)
Total Bilirubin: 0.3 mg/dL (ref 0.3–1.2)
Total Protein: 7.8 g/dL (ref 6.5–8.1)

## 2021-11-04 LAB — CBC WITH DIFFERENTIAL/PLATELET
Abs Immature Granulocytes: 0.07 10*3/uL (ref 0.00–0.07)
Basophils Absolute: 0 10*3/uL (ref 0.0–0.1)
Basophils Relative: 0 %
Eosinophils Absolute: 0 10*3/uL (ref 0.0–0.5)
Eosinophils Relative: 0 %
HCT: 34.9 % — ABNORMAL LOW (ref 36.0–46.0)
Hemoglobin: 10.7 g/dL — ABNORMAL LOW (ref 12.0–15.0)
Immature Granulocytes: 1 %
Lymphocytes Relative: 10 %
Lymphs Abs: 0.6 10*3/uL — ABNORMAL LOW (ref 0.7–4.0)
MCH: 27.2 pg (ref 26.0–34.0)
MCHC: 30.7 g/dL (ref 30.0–36.0)
MCV: 88.6 fL (ref 80.0–100.0)
Monocytes Absolute: 0.3 10*3/uL (ref 0.1–1.0)
Monocytes Relative: 4 %
Neutro Abs: 5.7 10*3/uL (ref 1.7–7.7)
Neutrophils Relative %: 85 %
Platelets: 258 10*3/uL (ref 150–400)
RBC: 3.94 MIL/uL (ref 3.87–5.11)
RDW: 15.2 % (ref 11.5–15.5)
WBC: 6.6 10*3/uL (ref 4.0–10.5)
nRBC: 0 % (ref 0.0–0.2)

## 2021-11-04 MED ORDER — HEPARIN SOD (PORK) LOCK FLUSH 100 UNIT/ML IV SOLN
500.0000 [IU] | Freq: Once | INTRAVENOUS | Status: AC | PRN
  Administered 2021-11-04: 500 [IU]
  Filled 2021-11-04: qty 5

## 2021-11-04 MED ORDER — SODIUM CHLORIDE 0.9 % IV SOLN
Freq: Once | INTRAVENOUS | Status: AC
  Filled 2021-11-04: qty 250

## 2021-11-04 MED ORDER — ACETAMINOPHEN 325 MG PO TABS
650.0000 mg | ORAL_TABLET | Freq: Once | ORAL | Status: AC
  Administered 2021-11-04: 650 mg via ORAL
  Filled 2021-11-04: qty 2

## 2021-11-04 MED ORDER — SODIUM CHLORIDE 0.9 % IV SOLN
3.6000 mg/kg | Freq: Once | INTRAVENOUS | Status: AC
  Administered 2021-11-04: 300 mg via INTRAVENOUS
  Filled 2021-11-04: qty 15

## 2021-11-04 MED ORDER — DIPHENHYDRAMINE HCL 25 MG PO CAPS
50.0000 mg | ORAL_CAPSULE | Freq: Once | ORAL | Status: AC
  Administered 2021-11-04: 50 mg via ORAL
  Filled 2021-11-04: qty 2

## 2021-11-04 NOTE — Patient Instructions (Signed)
[facility] [place]  Discharge Instructions: °Thank you for choosing [facility] to provide your oncology and hematology care.  ° °If you have a lab appointment with the Cancer Center, please go directly to the Cancer Center and check in at the registration area. °  °Wear comfortable clothing and clothing appropriate for easy access to any Portacath or PICC line.  ° °We strive to give you quality time with your provider. You may need to reschedule your appointment if you arrive late (15 or more minutes).  Arriving late affects you and other patients whose appointments are after yours.  Also, if you miss three or more appointments without notifying the office, you may be dismissed from the clinic at the provider’s discretion.    °  °For prescription refill requests, have your pharmacy contact our office and allow 72 hours for refills to be completed.   ° °Today you received the following chemotherapy and/or immunotherapy agents     °  °To help prevent nausea and vomiting after your treatment, we encourage you to take your nausea medication as directed. ° °BELOW ARE SYMPTOMS THAT SHOULD BE REPORTED IMMEDIATELY: °*FEVER GREATER THAN 100.4 F (38 °C) OR HIGHER °*CHILLS OR SWEATING °*NAUSEA AND VOMITING THAT IS NOT CONTROLLED WITH YOUR NAUSEA MEDICATION °*UNUSUAL SHORTNESS OF BREATH °*UNUSUAL BRUISING OR BLEEDING °*URINARY PROBLEMS (pain or burning when urinating, or frequent urination) °*BOWEL PROBLEMS (unusual diarrhea, constipation, pain near the anus) °TENDERNESS IN MOUTH AND THROAT WITH OR WITHOUT PRESENCE OF ULCERS (sore throat, sores in mouth, or a toothache) °UNUSUAL RASH, SWELLING OR PAIN  °UNUSUAL VAGINAL DISCHARGE OR ITCHING  ° °Items with * indicate a potential emergency and should be followed up as soon as possible or go to the Emergency Department if any problems should occur. ° °Please show the CHEMOTHERAPY ALERT CARD or IMMUNOTHERAPY ALERT CARD at check-in to the  Emergency Department and triage nurse. ° °Should you have questions after your visit or need to cancel or reschedule your appointment, please contact [facility] [place]  Dept: [phone]  and follow the prompts.  Office hours are 8:00 a.m. to 4:30 p.m. Monday - Friday. Please note that voicemails left after 4:00 p.m. may not be returned until the following business day.  We are closed weekends and major holidays. You have access to a nurse at all times for urgent questions. Please call the main number to the clinic Dept: [phone] and follow the prompts. ° ° °For any non-urgent questions, you may also contact your provider using MyChart. We now offer e-Visits for anyone 18 and older to request care online for non-urgent symptoms. For details visit [email or web address]. °  °Also download the MyChart app! Go to the app store, search "MyChart", open the app, select [facility], and log in with your MyChart username and password. ° °Due to Covid, a mask is required upon entering the hospital/clinic. If you do not have a mask, one will be given to you upon arrival. For doctor visits, patients may have 1 support person aged 18 or older with them. For treatment visits, patients cannot have anyone with them due to current Covid guidelines and our immunocompromised population.  ° °
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
## 2021-11-05 ENCOUNTER — Ambulatory Visit
Admission: RE | Admit: 2021-11-05 | Discharge: 2021-11-05 | Disposition: A | Discharge status: Home / Self Care | Payer: Medicare Other | Source: Ambulatory Visit | Attending: Radiation Oncology | Admitting: Radiation Oncology

## 2021-11-05 DIAGNOSIS — Z510 Encounter for antineoplastic radiation therapy: Secondary | ICD-10-CM | POA: Diagnosis not present

## 2021-11-12 ENCOUNTER — Other Ambulatory Visit: Payer: Self-pay | Admitting: Oncology

## 2021-11-13 ENCOUNTER — Encounter: Payer: Self-pay | Admitting: Oncology

## 2021-11-16 ENCOUNTER — Other Ambulatory Visit: Payer: Self-pay | Admitting: Oncology

## 2021-11-18 NOTE — Telephone Encounter (Signed)
Component Ref Range & Units 2 wk ago ?(11/04/21) 1 mo ago ?(10/14/21) 1 mo ago ?(09/30/21) 2 mo ago ?(09/19/21) 2 mo ago ?(09/09/21) 2 mo ago ?(09/06/21) 2 mo ago ?(08/22/21)  ?Potassium 3.5 - 5.1 mmol/L 4.3  4.0  3.2 Low   3.5  3.8  3.4 Low   3.9   ? ?

## 2021-11-19 ENCOUNTER — Encounter: Payer: Self-pay | Admitting: Oncology

## 2021-11-25 ENCOUNTER — Inpatient Hospital Stay (HOSPITAL_BASED_OUTPATIENT_CLINIC_OR_DEPARTMENT_OTHER): Payer: Medicare Other | Admitting: Oncology

## 2021-11-25 ENCOUNTER — Inpatient Hospital Stay: Payer: Medicare Other

## 2021-11-25 ENCOUNTER — Encounter: Payer: Self-pay | Admitting: Oncology

## 2021-11-25 ENCOUNTER — Inpatient Hospital Stay: Payer: Medicare Other | Attending: Oncology

## 2021-11-25 VITALS — HR 108

## 2021-11-25 VITALS — BP 132/81 | HR 107 | Temp 97.5°F | Wt 165.0 lb

## 2021-11-25 DIAGNOSIS — C50919 Malignant neoplasm of unspecified site of unspecified female breast: Secondary | ICD-10-CM

## 2021-11-25 DIAGNOSIS — C50211 Malignant neoplasm of upper-inner quadrant of right female breast: Secondary | ICD-10-CM | POA: Insufficient documentation

## 2021-11-25 DIAGNOSIS — T451X5A Adverse effect of antineoplastic and immunosuppressive drugs, initial encounter: Secondary | ICD-10-CM | POA: Diagnosis not present

## 2021-11-25 DIAGNOSIS — Z5112 Encounter for antineoplastic immunotherapy: Secondary | ICD-10-CM | POA: Diagnosis present

## 2021-11-25 DIAGNOSIS — Z79899 Other long term (current) drug therapy: Secondary | ICD-10-CM | POA: Insufficient documentation

## 2021-11-25 DIAGNOSIS — D6481 Anemia due to antineoplastic chemotherapy: Secondary | ICD-10-CM

## 2021-11-25 DIAGNOSIS — Z5111 Encounter for antineoplastic chemotherapy: Secondary | ICD-10-CM

## 2021-11-25 LAB — COMPREHENSIVE METABOLIC PANEL
ALT: 14 U/L (ref 0–44)
AST: 26 U/L (ref 15–41)
Albumin: 3.6 g/dL (ref 3.5–5.0)
Alkaline Phosphatase: 89 U/L (ref 38–126)
Anion gap: 8 (ref 5–15)
BUN: 12 mg/dL (ref 8–23)
CO2: 30 mmol/L (ref 22–32)
Calcium: 9.2 mg/dL (ref 8.9–10.3)
Chloride: 97 mmol/L — ABNORMAL LOW (ref 98–111)
Creatinine, Ser: 0.82 mg/dL (ref 0.44–1.00)
GFR, Estimated: >60 mL/min (ref >60)
Glucose, Bld: 110 mg/dL — ABNORMAL HIGH (ref 70–99)
Potassium: 3.6 mmol/L (ref 3.5–5.1)
Sodium: 135 mmol/L (ref 135–145)
Total Bilirubin: 0.4 mg/dL (ref 0.3–1.2)
Total Protein: 7.8 g/dL (ref 6.5–8.1)

## 2021-11-25 LAB — CBC WITH DIFFERENTIAL/PLATELET
Abs Immature Granulocytes: 0.02 10*3/uL (ref 0.00–0.07)
Basophils Absolute: 0 10*3/uL (ref 0.0–0.1)
Basophils Relative: 1 %
Eosinophils Absolute: 0.2 10*3/uL (ref 0.0–0.5)
Eosinophils Relative: 3 %
HCT: 33.5 % — ABNORMAL LOW (ref 36.0–46.0)
Hemoglobin: 10.7 g/dL — ABNORMAL LOW (ref 12.0–15.0)
Immature Granulocytes: 0 %
Lymphocytes Relative: 33 %
Lymphs Abs: 2.3 10*3/uL (ref 0.7–4.0)
MCH: 27.6 pg (ref 26.0–34.0)
MCHC: 31.9 g/dL (ref 30.0–36.0)
MCV: 86.6 fL (ref 80.0–100.0)
Monocytes Absolute: 0.6 10*3/uL (ref 0.1–1.0)
Monocytes Relative: 9 %
Neutro Abs: 3.8 10*3/uL (ref 1.7–7.7)
Neutrophils Relative %: 54 %
Platelets: 342 10*3/uL (ref 150–400)
RBC: 3.87 MIL/uL (ref 3.87–5.11)
RDW: 15.8 % — ABNORMAL HIGH (ref 11.5–15.5)
WBC: 7 10*3/uL (ref 4.0–10.5)
nRBC: 0 % (ref 0.0–0.2)

## 2021-11-25 MED ORDER — ACETAMINOPHEN 325 MG PO TABS
650.0000 mg | ORAL_TABLET | Freq: Once | ORAL | Status: AC
  Administered 2021-11-25: 650 mg via ORAL
  Filled 2021-11-25: qty 2

## 2021-11-25 MED ORDER — HEPARIN SOD (PORK) LOCK FLUSH 100 UNIT/ML IV SOLN
500.0000 [IU] | Freq: Once | INTRAVENOUS | Status: AC | PRN
  Administered 2021-11-25: 500 [IU]
  Filled 2021-11-25: qty 5

## 2021-11-25 MED ORDER — SODIUM CHLORIDE 0.9% FLUSH
10.0000 mL | INTRAVENOUS | Status: DC | PRN
  Administered 2021-11-25: 10 mL via INTRAVENOUS
  Filled 2021-11-25: qty 10

## 2021-11-25 MED ORDER — HEPARIN SOD (PORK) LOCK FLUSH 100 UNIT/ML IV SOLN
500.0000 [IU] | Freq: Once | INTRAVENOUS | Status: DC
  Filled 2021-11-25: qty 5

## 2021-11-25 MED ORDER — SODIUM CHLORIDE 0.9 % IV SOLN
Freq: Once | INTRAVENOUS | Status: AC
  Filled 2021-11-25: qty 250

## 2021-11-25 MED ORDER — DIPHENHYDRAMINE HCL 25 MG PO CAPS
50.0000 mg | ORAL_CAPSULE | Freq: Once | ORAL | Status: AC
  Administered 2021-11-25: 50 mg via ORAL
  Filled 2021-11-25: qty 2

## 2021-11-25 MED ORDER — SODIUM CHLORIDE 0.9 % IV SOLN
3.6000 mg/kg | Freq: Once | INTRAVENOUS | Status: AC
  Administered 2021-11-25: 300 mg via INTRAVENOUS
  Filled 2021-11-25: qty 15

## 2021-11-25 NOTE — Patient Instructions (Signed)
[facility] AT [place]  Discharge Instructions: ?Thank you for choosing [facility] to provide your oncology and hematology care.  ?If you have a lab appointment with the [facility], please go directly to the [facility] and check in at the registration area. ? ?Wear comfortable clothing and clothing appropriate for easy access to any Portacath or PICC line.  ? ?We strive to give you quality time with your provider. You may need to reschedule your appointment if you arrive late (15 or more minutes).  Arriving late affects you and other patients whose appointments are after yours.  Also, if you miss three or more appointments without notifying the office, you may be dismissed from the clinic at the provider?s discretion.    ?  ?For prescription refill requests, have your pharmacy contact our office and allow 72 hours for refills to be completed.   ? ?Today you received the following chemotherapy and/or immunotherapy agents KADCYLA    ?  ?To help prevent nausea and vomiting after your treatment, we encourage you to take your nausea medication as directed. ? ?BELOW ARE SYMPTOMS THAT SHOULD BE REPORTED IMMEDIATELY: ?*FEVER GREATER THAN 100.4 F (38 ?C) OR HIGHER ?*CHILLS OR SWEATING ?*NAUSEA AND VOMITING THAT IS NOT CONTROLLED WITH YOUR NAUSEA MEDICATION ?*UNUSUAL SHORTNESS OF BREATH ?*UNUSUAL BRUISING OR BLEEDING ?*URINARY PROBLEMS (pain or burning when urinating, or frequent urination) ?*BOWEL PROBLEMS (unusual diarrhea, constipation, pain near the anus) ?TENDERNESS IN MOUTH AND THROAT WITH OR WITHOUT PRESENCE OF ULCERS (sore throat, sores in mouth, or a toothache) ?UNUSUAL RASH, SWELLING OR PAIN  ?UNUSUAL VAGINAL DISCHARGE OR ITCHING  ? ?Items with * indicate a potential emergency and should be followed up as soon as possible or go to the Emergency Department if any problems should occur. ? ?Please show the CHEMOTHERAPY ALERT CARD or IMMUNOTHERAPY ALERT CARD at check-in to the  Emergency Department and triage nurse. ? ?Should you have questions after your visit or need to cancel or reschedule your appointment, please contact [facility] [place] AT [place]  [phone] and follow the prompts.  Office hours are 8:00 a.m. to 4:30 p.m. Monday - Friday. Please note that voicemails left after 4:00 p.m. may not be returned until the following business day.  We are closed weekends and major holidays. You have access to a nurse at all times for urgent questions. Please call the main number to the clinic [phone] and follow the prompts. ? ?For any non-urgent questions, you may also contact your provider using MyChart. We now offer e-Visits for anyone 71 and older to request care online for non-urgent symptoms. For details visit [email or web address]. ?  ?Also download the MyChart app! Go to the app store, search "MyChart", open the app, select [facility], and log in with your MyChart username and password. ? ?Due to Covid, a mask is required upon entering the hospital/clinic. If you do not have a mask, one will be given to you upon arrival. For doctor visits, patients may have 1 support person aged 8 or older with them. For treatment visits, patients cannot have anyone with them due to current Covid guidelines and our immunocompromised population.  ? ?Ado-Trastuzumab Emtansine for injection ?What is this medication? ?ADO-TRASTUZUMAB EMTANSINE (ADD oh traz TOO zuh mab em TAN zine) is a monoclonal antibody combined with chemotherapy. It is used to treat breast cancer. ?This medicine may be used for other purposes; ask your health care provider or pharmacist if you have questions. ?COMMON BRAND NAME(S):  Kadcyla ?What should I tell my care team before I take this medication? ?They need to know if you have any of these conditions: ?heart disease ?heart failure ?infection (especially a virus infection such as chickenpox, cold sores, or herpes) ?liver disease ?lung or breathing  disease, like asthma ?tingling of the fingers or toes, or other nerve disorder ?an unusual or allergic reaction to ado-trastuzumab emtansine, other medications, foods, dyes, or preservatives ?pregnant or trying to get pregnant ?breast-feeding ?How should I use this medication? ?This medicine is for infusion into a vein. It is given by a health care professional in a hospital or clinic setting. ?Talk to your pediatrician regarding the use of this medicine in children. Special care may be needed. ?Overdosage: If you think you have taken too much of this medicine contact a poison control center or emergency room at once. ?NOTE: This medicine is only for you. Do not share this medicine with others. ?What if I miss a dose? ?It is important not to miss your dose. Call your doctor or health care professional if you are unable to keep an appointment. ?What may interact with this medication? ?This medicine may also interact with the following medications: ?atazanavir ?boceprevir ?clarithromycin ?delavirdine ?indinavir ?dalfopristin; quinupristin ?isoniazid, INH ?itraconazole ?ketoconazole ?nefazodone ?nelfinavir ?ritonavir ?telaprevir ?telithromycin ?tipranavir ?voriconazole ?This list may not describe all possible interactions. Give your health care provider a list of all the medicines, herbs, non-prescription drugs, or dietary supplements you use. Also tell them if you smoke, drink alcohol, or use illegal drugs. Some items may interact with your medicine. ?What should I watch for while using this medication? ?Visit your doctor for checks on your progress. This drug may make you feel generally unwell. This is not uncommon, as chemotherapy can affect healthy cells as well as cancer cells. Report any side effects. Continue your course of treatment even though you feel ill unless your doctor tells you to stop. ?You may need blood work done while you are taking this medicine. ?Call your doctor or health care professional for  advice if you get a fever, chills or sore throat, or other symptoms of a cold or flu. Do not treat yourself. This drug decreases your body's ability to fight infections. Try to avoid being around people who are sick. ?Be careful brushing and flossing your teeth or using a toothpick because you may get an infection or bleed more easily. If you have any dental work done, tell your dentist you are receiving this medicine. ?Avoid taking products that contain aspirin, acetaminophen, ibuprofen, naproxen, or ketoprofen unless instructed by your doctor. These medicines may hide a fever. ?Do not become pregnant while taking this medicine or for 7 months after stopping it, men with female partners should use contraception during treatment and for 4 months after the last dose. Women should inform their doctor if they wish to become pregnant or think they might be pregnant. There is a potential for serious side effects to an unborn child. Do not breast-feed an infant while taking this medicine or for 7 months after the last dose. ?Men who have a partner who is pregnant or who is capable of becoming pregnant should use a condom during sexual activity while taking this medicine and for 4 months after stopping it. Men should inform their doctors if they wish to father a child. This medicine may lower sperm counts. Talk to your health care professional or pharmacist for more information. ?What side effects may I notice from receiving this medication? ?Side  effects that you should report to your doctor or health care professional as soon as possible: ?allergic reactions like skin rash, itching or hives, swelling of the face, lips, or tongue ?breathing problems ?chest pain or palpitations ?fever or chills, sore throat ?general ill feeling or flu-like symptoms ?light-colored stools ?nausea, vomiting ?pain, tingling, numbness in the hands or feet ?signs and symptoms of bleeding such as bloody or black, tarry stools; red or dark-brown  urine; spitting up blood or brown material that looks like coffee grounds; red spots on the skin; unusual bruising or bleeding from the eye, gums, or nose ?swelling of the legs or ankles ?yellowing of the ey
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
## 2021-11-26 ENCOUNTER — Telehealth: Payer: Self-pay

## 2021-11-26 DIAGNOSIS — C50919 Malignant neoplasm of unspecified site of unspecified female breast: Secondary | ICD-10-CM

## 2021-11-26 NOTE — Telephone Encounter (Signed)
Spoke with patient and informed patient of Dr. Collie Siad plan and recommendation for estrogen blocker in the future. Advised that we need to schedule her for Bone Density scan. Informed patient that Dr.Yu will further discuss results and plan for antiestrogen at next appt. Patient verbalized understanding.  ? ? ?Debra [name], please schedule patient for Bone density scan next available. Please notify patient of appt.  ?

## 2021-11-26 NOTE — Telephone Encounter (Signed)
-----   Message from Earlie Server, MD sent at 11/25/2021  8:25 PM EDT ----- ?I forgot to mention to her that I recommend her to start estrogen blocker in the future.  In order to do that, I recommend her to have a baseline bone density checked.  Please let her know and arrange.  We will further discuss about bone density results at the next visit and discuss about antiestrogen treatments.  Thank you ? ?

## 2021-12-11 ENCOUNTER — Encounter: Payer: Self-pay | Admitting: Radiation Oncology

## 2021-12-11 ENCOUNTER — Ambulatory Visit
Admission: RE | Admit: 2021-12-11 | Discharge: 2021-12-11 | Disposition: A | Discharge status: Home / Self Care | Payer: Medicare Other | Source: Ambulatory Visit | Attending: Radiation Oncology | Admitting: Radiation Oncology

## 2021-12-11 VITALS — BP 114/66 | HR 108 | Temp 98.2°F | Resp 16 | Wt 163.4 lb

## 2021-12-11 DIAGNOSIS — Z923 Personal history of irradiation: Secondary | ICD-10-CM | POA: Insufficient documentation

## 2021-12-11 DIAGNOSIS — Z170 Estrogen receptor positive status [ER+]: Secondary | ICD-10-CM | POA: Diagnosis not present

## 2021-12-11 DIAGNOSIS — C50211 Malignant neoplasm of upper-inner quadrant of right female breast: Secondary | ICD-10-CM | POA: Diagnosis present

## 2021-12-11 DIAGNOSIS — C50919 Malignant neoplasm of unspecified site of unspecified female breast: Secondary | ICD-10-CM

## 2021-12-11 NOTE — Progress Notes (Signed)
Radiation Oncology ?Follow up Note ? ?Name: Debra [name]   ?Date:   12/11/2021 ?MRN:  [id number] ?DOB: [birth year]  ? ? ?This 66 y.o. female presents to the clinic today for 1 month follow-up status post whole breast radiation to her right breast for stage Ib (cT2 N0 M0) grade 1 triple positive invasive mammary carcinoma status post neoadjuvant chemotherapy and wide local excision and adjuvant radiation. ? ?REFERRING PROVIDER: [name], MD ? ?HPI: Patient is a 66 year old female now at 1 month having completed whole breast radiation to her right breast for triple positive invasive mammary carcinoma.  She is seen today in routine follow-up is having some minor pain in the right breast consistent with postsurgical changes.  There is some firmness of the breast consistent with a seroma.  She is currently undergoing.Kadcyla which she is tolerating well.  She is scheduled for mammogram early next month her breast is rather sore she has been to call the surgeon to see if that can be delayed. ? ?COMPLICATIONS OF TREATMENT: none ? ?FOLLOW UP COMPLIANCE: keeps appointments  ? ?PHYSICAL EXAM:  ?BP 114/66 (BP Location: Left Arm, Patient Position: Sitting, Cuff Size: Normal)   Pulse (!) 108   Temp 98.2 ?F (36.8 ?C) (Tympanic)   Resp 16   Wt 163 lb 6.4 oz (74.1 kg)   BMI 28.05 kg/m?  ?Right breast is still somewhat hyperpigmented.  There are some firmness in the breast consistent with seroma.  Otherwise breast exam is within normal limits.  No axillary or supraclavicular adenopathy is identified.  Well-developed well-nourished patient in NAD. HEENT reveals PERLA, EOMI, discs not visualized.  Oral cavity is clear. No oral mucosal lesions are identified. Neck is clear without evidence of cervical or supraclavicular adenopathy. Lungs are clear to A&P. Cardiac examination is essentially unremarkable with regular rate and rhythm without murmur rub or thrill. Abdomen is benign with no organomegaly or masses noted. Motor  sensory and DTR levels are equal and symmetric in the upper and lower extremities. Cranial nerves II through XII are grossly intact. Proprioception is intact. No peripheral adenopathy or edema is identified. No motor or sensory levels are noted. Crude visual fields are within normal range. ? ?RADIOLOGY RESULTS: No current films for review ? ?PLAN: Present time patient is recovering well from her whole breast radiation.  And pleased with her overall progress.  She continues chemotherapy with medical oncology based on her HER2/neu positive disease.  I have asked to see her back in 4 to 5 months for follow-up.  She will be calling her surgeon to see if her mammogram can be delayed.  I believe is rather early after radiation and she is still having significant tenderness. ? ?I would like to take this opportunity to thank you for allowing me to participate in the care of your patient.. ?  ? Noreene Filbert, MD ? ?

## 2021-12-16 ENCOUNTER — Other Ambulatory Visit: Payer: Medicare Other

## 2021-12-16 ENCOUNTER — Other Ambulatory Visit: Payer: Self-pay | Admitting: Oncology

## 2021-12-17 ENCOUNTER — Inpatient Hospital Stay: Payer: Medicare Other

## 2021-12-17 ENCOUNTER — Telehealth: Payer: Self-pay | Admitting: Oncology

## 2021-12-17 ENCOUNTER — Inpatient Hospital Stay: Payer: Medicare Other | Admitting: Oncology

## 2021-12-17 NOTE — Telephone Encounter (Signed)
We already have 26 for Monday, please move her to Tuesday 5/9. Thanks ?

## 2021-12-17 NOTE — Telephone Encounter (Signed)
Pt called and stated she is sick and can not come in today for her appointment. She would like to reschedule for a Monday.  ? ?Please advise.  ? ?Thank you  ?

## 2021-12-17 NOTE — Telephone Encounter (Signed)
Please r/s patient for Lab/MD/TDMI to 5/9. Please notify patient  ?

## 2021-12-24 ENCOUNTER — Inpatient Hospital Stay: Payer: Medicare Other | Attending: Oncology

## 2021-12-24 ENCOUNTER — Inpatient Hospital Stay: Payer: Medicare Other

## 2021-12-24 ENCOUNTER — Inpatient Hospital Stay (HOSPITAL_BASED_OUTPATIENT_CLINIC_OR_DEPARTMENT_OTHER): Payer: Medicare Other | Admitting: Oncology

## 2021-12-24 ENCOUNTER — Encounter: Payer: Self-pay | Admitting: Oncology

## 2021-12-24 VITALS — BP 100/67 | HR 89 | Temp 96.6°F | Resp 18 | Wt 165.6 lb

## 2021-12-24 DIAGNOSIS — E46 Unspecified protein-calorie malnutrition: Secondary | ICD-10-CM

## 2021-12-24 DIAGNOSIS — J9610 Chronic respiratory failure, unspecified whether with hypoxia or hypercapnia: Secondary | ICD-10-CM | POA: Diagnosis not present

## 2021-12-24 DIAGNOSIS — Z5112 Encounter for antineoplastic immunotherapy: Secondary | ICD-10-CM | POA: Insufficient documentation

## 2021-12-24 DIAGNOSIS — Z8051 Family history of malignant neoplasm of kidney: Secondary | ICD-10-CM | POA: Diagnosis not present

## 2021-12-24 DIAGNOSIS — Z79899 Other long term (current) drug therapy: Secondary | ICD-10-CM

## 2021-12-24 DIAGNOSIS — T451X5A Adverse effect of antineoplastic and immunosuppressive drugs, initial encounter: Secondary | ICD-10-CM | POA: Diagnosis not present

## 2021-12-24 DIAGNOSIS — D6481 Anemia due to antineoplastic chemotherapy: Secondary | ICD-10-CM | POA: Diagnosis not present

## 2021-12-24 DIAGNOSIS — C50919 Malignant neoplasm of unspecified site of unspecified female breast: Secondary | ICD-10-CM | POA: Diagnosis not present

## 2021-12-24 DIAGNOSIS — Z170 Estrogen receptor positive status [ER+]: Secondary | ICD-10-CM | POA: Insufficient documentation

## 2021-12-24 DIAGNOSIS — Z801 Family history of malignant neoplasm of trachea, bronchus and lung: Secondary | ICD-10-CM | POA: Insufficient documentation

## 2021-12-24 DIAGNOSIS — M7989 Other specified soft tissue disorders: Secondary | ICD-10-CM | POA: Diagnosis not present

## 2021-12-24 DIAGNOSIS — C50211 Malignant neoplasm of upper-inner quadrant of right female breast: Secondary | ICD-10-CM | POA: Diagnosis present

## 2021-12-24 DIAGNOSIS — Z5181 Encounter for therapeutic drug level monitoring: Secondary | ICD-10-CM

## 2021-12-24 DIAGNOSIS — Z808 Family history of malignant neoplasm of other organs or systems: Secondary | ICD-10-CM | POA: Insufficient documentation

## 2021-12-24 DIAGNOSIS — Z5111 Encounter for antineoplastic chemotherapy: Secondary | ICD-10-CM

## 2021-12-24 DIAGNOSIS — D630 Anemia in neoplastic disease: Secondary | ICD-10-CM | POA: Diagnosis not present

## 2021-12-24 DIAGNOSIS — Z803 Family history of malignant neoplasm of breast: Secondary | ICD-10-CM | POA: Insufficient documentation

## 2021-12-24 DIAGNOSIS — Z95828 Presence of other vascular implants and grafts: Secondary | ICD-10-CM

## 2021-12-24 LAB — CBC WITH DIFFERENTIAL/PLATELET
Abs Immature Granulocytes: 0.02 10*3/uL (ref 0.00–0.07)
Basophils Absolute: 0 10*3/uL (ref 0.0–0.1)
Basophils Relative: 1 %
Eosinophils Absolute: 0.3 10*3/uL (ref 0.0–0.5)
Eosinophils Relative: 4 %
HCT: 32 % — ABNORMAL LOW (ref 36.0–46.0)
Hemoglobin: 9.8 g/dL — ABNORMAL LOW (ref 12.0–15.0)
Immature Granulocytes: 0 %
Lymphocytes Relative: 29 %
Lymphs Abs: 2 10*3/uL (ref 0.7–4.0)
MCH: 27.3 pg (ref 26.0–34.0)
MCHC: 30.6 g/dL (ref 30.0–36.0)
MCV: 89.1 fL (ref 80.0–100.0)
Monocytes Absolute: 0.7 10*3/uL (ref 0.1–1.0)
Monocytes Relative: 10 %
Neutro Abs: 4 10*3/uL (ref 1.7–7.7)
Neutrophils Relative %: 56 %
Platelets: 283 10*3/uL (ref 150–400)
RBC: 3.59 MIL/uL — ABNORMAL LOW (ref 3.87–5.11)
RDW: 15.9 % — ABNORMAL HIGH (ref 11.5–15.5)
WBC: 7.1 10*3/uL (ref 4.0–10.5)
nRBC: 0 % (ref 0.0–0.2)

## 2021-12-24 LAB — COMPREHENSIVE METABOLIC PANEL
ALT: 13 U/L (ref 0–44)
AST: 21 U/L (ref 15–41)
Albumin: 3.6 g/dL (ref 3.5–5.0)
Alkaline Phosphatase: 91 U/L (ref 38–126)
Anion gap: 6 (ref 5–15)
BUN: 12 mg/dL (ref 8–23)
CO2: 33 mmol/L — ABNORMAL HIGH (ref 22–32)
Calcium: 8.8 mg/dL — ABNORMAL LOW (ref 8.9–10.3)
Chloride: 96 mmol/L — ABNORMAL LOW (ref 98–111)
Creatinine, Ser: 0.83 mg/dL (ref 0.44–1.00)
GFR, Estimated: >60 mL/min (ref >60)
Glucose, Bld: 148 mg/dL — ABNORMAL HIGH (ref 70–99)
Potassium: 3.8 mmol/L (ref 3.5–5.1)
Sodium: 135 mmol/L (ref 135–145)
Total Bilirubin: 0.5 mg/dL (ref 0.3–1.2)
Total Protein: 7.7 g/dL (ref 6.5–8.1)

## 2021-12-24 MED ORDER — HEPARIN SOD (PORK) LOCK FLUSH 100 UNIT/ML IV SOLN
500.0000 [IU] | Freq: Once | INTRAVENOUS | Status: AC
  Administered 2021-12-24: 500 [IU] via INTRAVENOUS
  Filled 2021-12-24: qty 5

## 2021-12-24 NOTE — Progress Notes (Signed)
Patient here for follow up and tx. Pt reports that she was not feeling well last week that's why she did not come for tx. Informed her about [facility] and that she can call if she does not feel well.  ?

## 2021-12-24 NOTE — Progress Notes (Signed)
Nutrition ? ?RD planning to see patient during infusion today but infusion cancelled.  Will follow-up at next infusion date.   ? ?Lili Harts B. Zenia Resides, RD, LDN ?Registered Dietitian ?[phone] ? ?
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
## 2021-12-26 ENCOUNTER — Ambulatory Visit
Admission: RE | Admit: 2021-12-26 | Discharge: 2021-12-26 | Disposition: A | Discharge status: Home / Self Care | Payer: Medicare Other | Source: Ambulatory Visit | Attending: Oncology | Admitting: Oncology

## 2021-12-26 ENCOUNTER — Encounter: Payer: Self-pay | Admitting: Oncology

## 2021-12-26 DIAGNOSIS — J449 Chronic obstructive pulmonary disease, unspecified: Secondary | ICD-10-CM | POA: Insufficient documentation

## 2021-12-26 DIAGNOSIS — Z5181 Encounter for therapeutic drug level monitoring: Secondary | ICD-10-CM | POA: Diagnosis present

## 2021-12-26 DIAGNOSIS — E785 Hyperlipidemia, unspecified: Secondary | ICD-10-CM | POA: Insufficient documentation

## 2021-12-26 DIAGNOSIS — I340 Nonrheumatic mitral (valve) insufficiency: Secondary | ICD-10-CM | POA: Insufficient documentation

## 2021-12-26 DIAGNOSIS — G4733 Obstructive sleep apnea (adult) (pediatric): Secondary | ICD-10-CM | POA: Diagnosis not present

## 2021-12-26 DIAGNOSIS — I10 Essential (primary) hypertension: Secondary | ICD-10-CM | POA: Diagnosis not present

## 2021-12-26 DIAGNOSIS — Z79899 Other long term (current) drug therapy: Secondary | ICD-10-CM | POA: Insufficient documentation

## 2021-12-26 LAB — ECHOCARDIOGRAM COMPLETE
AR max vel: 2.16 cm2
AV Area VTI: 2.39 cm2
AV Area mean vel: 1.89 cm2
AV Mean grad: 4 mmHg
AV Peak grad: 7.4 mmHg
Ao pk vel: 1.36 m/s
Area-P 1/2: 4.21 cm2
MV VTI: 2.93 cm2
S' Lateral: 2.9 cm

## 2021-12-26 NOTE — Progress Notes (Signed)
*  PRELIMINARY RESULTS* ?Echocardiogram ?2D Echocardiogram has been performed. ? ?Debra [name], Debra [name] ?12/26/2021, 9:41 AM ?

## 2022-01-07 ENCOUNTER — Inpatient Hospital Stay: Payer: Medicare Other

## 2022-01-07 ENCOUNTER — Inpatient Hospital Stay (HOSPITAL_BASED_OUTPATIENT_CLINIC_OR_DEPARTMENT_OTHER): Payer: Medicare Other | Admitting: Oncology

## 2022-01-07 ENCOUNTER — Encounter: Payer: Self-pay | Admitting: Oncology

## 2022-01-07 VITALS — BP 100/68 | HR 98 | Temp 97.2°F | Wt 161.0 lb

## 2022-01-07 DIAGNOSIS — Z5111 Encounter for antineoplastic chemotherapy: Secondary | ICD-10-CM

## 2022-01-07 DIAGNOSIS — T451X5A Adverse effect of antineoplastic and immunosuppressive drugs, initial encounter: Secondary | ICD-10-CM | POA: Diagnosis not present

## 2022-01-07 DIAGNOSIS — Z5112 Encounter for antineoplastic immunotherapy: Secondary | ICD-10-CM | POA: Diagnosis not present

## 2022-01-07 DIAGNOSIS — D6481 Anemia due to antineoplastic chemotherapy: Secondary | ICD-10-CM | POA: Diagnosis not present

## 2022-01-07 DIAGNOSIS — C50919 Malignant neoplasm of unspecified site of unspecified female breast: Secondary | ICD-10-CM

## 2022-01-07 LAB — COMPREHENSIVE METABOLIC PANEL
ALT: 14 U/L (ref 0–44)
AST: 26 U/L (ref 15–41)
Albumin: 3.7 g/dL (ref 3.5–5.0)
Alkaline Phosphatase: 80 U/L (ref 38–126)
Anion gap: 9 (ref 5–15)
BUN: 13 mg/dL (ref 8–23)
CO2: 34 mmol/L — ABNORMAL HIGH (ref 22–32)
Calcium: 9.6 mg/dL (ref 8.9–10.3)
Chloride: 96 mmol/L — ABNORMAL LOW (ref 98–111)
Creatinine, Ser: 0.73 mg/dL (ref 0.44–1.00)
GFR, Estimated: >60 mL/min (ref >60)
Glucose, Bld: 145 mg/dL — ABNORMAL HIGH (ref 70–99)
Potassium: 3.4 mmol/L — ABNORMAL LOW (ref 3.5–5.1)
Sodium: 139 mmol/L (ref 135–145)
Total Bilirubin: 0.7 mg/dL (ref 0.3–1.2)
Total Protein: 8 g/dL (ref 6.5–8.1)

## 2022-01-07 LAB — CBC WITH DIFFERENTIAL/PLATELET
Abs Immature Granulocytes: 0.01 10*3/uL (ref 0.00–0.07)
Basophils Absolute: 0 10*3/uL (ref 0.0–0.1)
Basophils Relative: 1 %
Eosinophils Absolute: 0.2 10*3/uL (ref 0.0–0.5)
Eosinophils Relative: 3 %
HCT: 34.8 % — ABNORMAL LOW (ref 36.0–46.0)
Hemoglobin: 10.8 g/dL — ABNORMAL LOW (ref 12.0–15.0)
Immature Granulocytes: 0 %
Lymphocytes Relative: 26 %
Lymphs Abs: 2 10*3/uL (ref 0.7–4.0)
MCH: 27.5 pg (ref 26.0–34.0)
MCHC: 31 g/dL (ref 30.0–36.0)
MCV: 88.5 fL (ref 80.0–100.0)
Monocytes Absolute: 0.6 10*3/uL (ref 0.1–1.0)
Monocytes Relative: 7 %
Neutro Abs: 4.8 10*3/uL (ref 1.7–7.7)
Neutrophils Relative %: 63 %
Platelets: 286 10*3/uL (ref 150–400)
RBC: 3.93 MIL/uL (ref 3.87–5.11)
RDW: 15.1 % (ref 11.5–15.5)
WBC: 7.6 10*3/uL (ref 4.0–10.5)
nRBC: 0 % (ref 0.0–0.2)

## 2022-01-07 MED ORDER — HEPARIN SOD (PORK) LOCK FLUSH 100 UNIT/ML IV SOLN
500.0000 [IU] | Freq: Once | INTRAVENOUS | Status: AC | PRN
  Administered 2022-01-07: 500 [IU]
  Filled 2022-01-07: qty 5

## 2022-01-07 MED ORDER — SODIUM CHLORIDE 0.9 % IV SOLN
260.0000 mg | Freq: Once | INTRAVENOUS | Status: AC
  Administered 2022-01-07: 260 mg via INTRAVENOUS
  Filled 2022-01-07: qty 8

## 2022-01-07 MED ORDER — SODIUM CHLORIDE 0.9 % IV SOLN
Freq: Once | INTRAVENOUS | Status: AC
  Filled 2022-01-07: qty 250

## 2022-01-07 MED ORDER — ACETAMINOPHEN 325 MG PO TABS
650.0000 mg | ORAL_TABLET | Freq: Once | ORAL | Status: AC
  Administered 2022-01-07: 650 mg via ORAL
  Filled 2022-01-07: qty 2

## 2022-01-07 MED ORDER — DIPHENHYDRAMINE HCL 25 MG PO CAPS
50.0000 mg | ORAL_CAPSULE | Freq: Once | ORAL | Status: AC
  Administered 2022-01-07: 50 mg via ORAL
  Filled 2022-01-07: qty 2

## 2022-01-07 NOTE — Patient Instructions (Signed)
[facility] CANCER CTR AT [place]  Discharge Instructions: Thank you for choosing [facility] to provide your oncology and hematology care.  If you have a lab appointment with the [facility], please go directly to the [facility] and check in at the registration area.  Wear comfortable clothing and clothing appropriate for easy access to any Portacath or PICC line.   We strive to give you quality time with your provider. You may need to reschedule your appointment if you arrive late (15 or more minutes).  Arriving late affects you and other patients whose appointments are after yours.  Also, if you miss three or more appointments without notifying the office, you may be dismissed from the clinic at the provider's discretion.      For prescription refill requests, have your pharmacy contact our office and allow 72 hours for refills to be completed.    Today you received the following chemotherapy and/or immunotherapy agents:Kadcyla      To help prevent nausea and vomiting after your treatment, we encourage you to take your nausea medication as directed.  BELOW ARE SYMPTOMS THAT SHOULD BE REPORTED IMMEDIATELY: *FEVER GREATER THAN 100.4 F (38 C) OR HIGHER *CHILLS OR SWEATING *NAUSEA AND VOMITING THAT IS NOT CONTROLLED WITH YOUR NAUSEA MEDICATION *UNUSUAL SHORTNESS OF BREATH *UNUSUAL BRUISING OR BLEEDING *URINARY PROBLEMS (pain or burning when urinating, or frequent urination) *BOWEL PROBLEMS (unusual diarrhea, constipation, pain near the anus) TENDERNESS IN MOUTH AND THROAT WITH OR WITHOUT PRESENCE OF ULCERS (sore throat, sores in mouth, or a toothache) UNUSUAL RASH, SWELLING OR PAIN  UNUSUAL VAGINAL DISCHARGE OR ITCHING   Items with * indicate a potential emergency and should be followed up as soon as possible or go to the Emergency Department if any problems should occur.  Please show the CHEMOTHERAPY ALERT CARD or IMMUNOTHERAPY ALERT CARD at check-in to the  Emergency Department and triage nurse.  Should you have questions after your visit or need to cancel or reschedule your appointment, please contact [facility] CANCER [place] AT [place]  [phone] and follow the prompts.  Office hours are 8:00 a.m. to 4:30 p.m. Monday - Friday. Please note that voicemails left after 4:00 p.m. may not be returned until the following business day.  We are closed weekends and major holidays. You have access to a nurse at all times for urgent questions. Please call the main number to the clinic [phone] and follow the prompts.  For any non-urgent questions, you may also contact your provider using MyChart. We now offer e-Visits for anyone 31 and older to request care online for non-urgent symptoms. For details visit [email or web address].   Also download the MyChart app! Go to the app store, search "MyChart", open the app, select [place], and log in with your MyChart username and password.  Due to Covid, a mask is required upon entering the hospital/clinic. If you do not have a mask, one will be given to you upon arrival. For doctor visits, patients may have 1 support person aged 50 or older with them. For treatment visits, patients cannot have anyone with them due to current Covid guidelines and our immunocompromised population.
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
## 2022-01-07 NOTE — Progress Notes (Signed)
Per MD dose adjusted for current weight.

## 2022-01-07 NOTE — Progress Notes (Signed)
Nutrition Follow-up:  Patient with triple positive breast cancer.  Patient receiving kadcyla.  Met with patient today in infusion.  Patient reports that her appetite is good.  Says that she ate 3 slices of pizza and tater tots for dinner last night.  For lunch yesterday ate cheese sandwich with lettuce.  Usually does not eat much breakfast.  Does not report nausea.  Has some taste changes.  Says that she drinks 3 boost shake a day with meals.    Medications: reviewed  Labs: reviewed  Anthropometrics:   Weight 161 lb today  183 lb 4.8 oz on 10/17 181 lb 9/26 184 lb on 8/29   NUTRITION DIAGNOSIS: Inadequate oral intake with weight loss   INTERVENTION:  Discussed importance of weight maintenance Discussed what makes up a meal and quantity of food. Encouraged good sources or protein Encouraged oral nutrition supplements    MONITORING, EVALUATION, GOAL: weight trends, intake   NEXT VISIT: Tuesday, June 13 during infusion  Miguel Christiana B. Zenia Resides, [place], [place] Registered Dietitian [phone]

## 2022-01-15 ENCOUNTER — Ambulatory Visit
Admission: RE | Admit: 2022-01-15 | Discharge: 2022-01-15 | Disposition: A | Discharge status: Home / Self Care | Payer: Medicare Other | Source: Ambulatory Visit | Attending: Oncology | Admitting: Oncology

## 2022-01-15 DIAGNOSIS — Z853 Personal history of malignant neoplasm of breast: Secondary | ICD-10-CM | POA: Diagnosis not present

## 2022-01-15 DIAGNOSIS — M8589 Other specified disorders of bone density and structure, multiple sites: Secondary | ICD-10-CM | POA: Insufficient documentation

## 2022-01-15 DIAGNOSIS — C50919 Malignant neoplasm of unspecified site of unspecified female breast: Secondary | ICD-10-CM | POA: Insufficient documentation

## 2022-01-15 DIAGNOSIS — J449 Chronic obstructive pulmonary disease, unspecified: Secondary | ICD-10-CM | POA: Insufficient documentation

## 2022-01-15 DIAGNOSIS — Z780 Asymptomatic menopausal state: Secondary | ICD-10-CM | POA: Insufficient documentation

## 2022-01-15 DIAGNOSIS — Z13820 Encounter for screening for osteoporosis: Secondary | ICD-10-CM | POA: Insufficient documentation

## 2022-01-21 ENCOUNTER — Other Ambulatory Visit: Payer: Self-pay | Admitting: Oncology

## 2022-01-22 ENCOUNTER — Other Ambulatory Visit: Payer: Medicare Other

## 2022-01-27 ENCOUNTER — Ambulatory Visit
Admission: RE | Admit: 2022-01-27 | Discharge: 2022-01-27 | Disposition: A | Discharge status: Home / Self Care | Payer: Medicare Other | Source: Ambulatory Visit | Attending: General Surgery | Admitting: General Surgery

## 2022-01-27 ENCOUNTER — Other Ambulatory Visit: Payer: Self-pay | Admitting: Oncology

## 2022-01-27 DIAGNOSIS — Z853 Personal history of malignant neoplasm of breast: Secondary | ICD-10-CM

## 2022-01-27 DIAGNOSIS — C50919 Malignant neoplasm of unspecified site of unspecified female breast: Secondary | ICD-10-CM

## 2022-01-27 NOTE — Telephone Encounter (Signed)
Component Ref Range & Units 2 wk ago (01/07/22) 1 mo ago (12/24/21) 2 mo ago (11/25/21) 2 mo ago (11/04/21) 3 mo ago (10/14/21) 3 mo ago (09/30/21) 4 mo ago (09/19/21)  Potassium 3.5 - 5.1 mmol/L 3.4 Low   3.8  3.6  4.3  4.0  3.2 Low   3.5

## 2022-01-28 ENCOUNTER — Inpatient Hospital Stay (HOSPITAL_BASED_OUTPATIENT_CLINIC_OR_DEPARTMENT_OTHER): Payer: Medicare Other | Admitting: Oncology

## 2022-01-28 ENCOUNTER — Other Ambulatory Visit (HOSPITAL_COMMUNITY): Payer: Self-pay | Admitting: Lab

## 2022-01-28 ENCOUNTER — Inpatient Hospital Stay: Payer: Medicare Other | Attending: Oncology

## 2022-01-28 ENCOUNTER — Inpatient Hospital Stay: Payer: Medicare Other

## 2022-01-28 ENCOUNTER — Encounter: Payer: Self-pay | Admitting: Oncology

## 2022-01-28 VITALS — BP 131/78 | HR 99 | Temp 98.2°F | Ht 64.0 in | Wt 164.0 lb

## 2022-01-28 DIAGNOSIS — D649 Anemia, unspecified: Secondary | ICD-10-CM

## 2022-01-28 DIAGNOSIS — C50919 Malignant neoplasm of unspecified site of unspecified female breast: Secondary | ICD-10-CM

## 2022-01-28 DIAGNOSIS — R8290 Unspecified abnormal findings in urine: Secondary | ICD-10-CM

## 2022-01-28 DIAGNOSIS — R600 Localized edema: Secondary | ICD-10-CM | POA: Diagnosis not present

## 2022-01-28 DIAGNOSIS — C50211 Malignant neoplasm of upper-inner quadrant of right female breast: Secondary | ICD-10-CM | POA: Insufficient documentation

## 2022-01-28 DIAGNOSIS — Z79899 Other long term (current) drug therapy: Secondary | ICD-10-CM | POA: Diagnosis not present

## 2022-01-28 DIAGNOSIS — Z5112 Encounter for antineoplastic immunotherapy: Secondary | ICD-10-CM | POA: Insufficient documentation

## 2022-01-28 DIAGNOSIS — Z5111 Encounter for antineoplastic chemotherapy: Secondary | ICD-10-CM | POA: Diagnosis not present

## 2022-01-28 DIAGNOSIS — M8580 Other specified disorders of bone density and structure, unspecified site: Secondary | ICD-10-CM | POA: Insufficient documentation

## 2022-01-28 LAB — URINALYSIS, COMPLETE (UACMP) WITH MICROSCOPIC
Bilirubin Urine: NEGATIVE
Glucose, UA: NEGATIVE mg/dL
Hgb urine dipstick: NEGATIVE
Ketones, ur: NEGATIVE mg/dL
Nitrite: NEGATIVE
Protein, ur: NEGATIVE mg/dL
Specific Gravity, Urine: 1.017 (ref 1.005–1.030)
pH: 5 (ref 5.0–8.0)

## 2022-01-28 LAB — CBC WITH DIFFERENTIAL/PLATELET
Abs Immature Granulocytes: 0.03 10*3/uL (ref 0.00–0.07)
Basophils Absolute: 0 10*3/uL (ref 0.0–0.1)
Basophils Relative: 0 %
Eosinophils Absolute: 0.3 10*3/uL (ref 0.0–0.5)
Eosinophils Relative: 4 %
HCT: 30.9 % — ABNORMAL LOW (ref 36.0–46.0)
Hemoglobin: 9.7 g/dL — ABNORMAL LOW (ref 12.0–15.0)
Immature Granulocytes: 0 %
Lymphocytes Relative: 33 %
Lymphs Abs: 2.5 10*3/uL (ref 0.7–4.0)
MCH: 28.3 pg (ref 26.0–34.0)
MCHC: 31.4 g/dL (ref 30.0–36.0)
MCV: 90.1 fL (ref 80.0–100.0)
Monocytes Absolute: 0.7 10*3/uL (ref 0.1–1.0)
Monocytes Relative: 9 %
Neutro Abs: 4.2 10*3/uL (ref 1.7–7.7)
Neutrophils Relative %: 54 %
Platelets: 274 10*3/uL (ref 150–400)
RBC: 3.43 MIL/uL — ABNORMAL LOW (ref 3.87–5.11)
RDW: 15.3 % (ref 11.5–15.5)
WBC: 7.7 10*3/uL (ref 4.0–10.5)
nRBC: 0 % (ref 0.0–0.2)

## 2022-01-28 LAB — COMPREHENSIVE METABOLIC PANEL
ALT: 15 U/L (ref 0–44)
AST: 27 U/L (ref 15–41)
Albumin: 3.4 g/dL — ABNORMAL LOW (ref 3.5–5.0)
Alkaline Phosphatase: 88 U/L (ref 38–126)
Anion gap: 5 (ref 5–15)
BUN: 13 mg/dL (ref 8–23)
CO2: 32 mmol/L (ref 22–32)
Calcium: 8.8 mg/dL — ABNORMAL LOW (ref 8.9–10.3)
Chloride: 101 mmol/L (ref 98–111)
Creatinine, Ser: 0.85 mg/dL (ref 0.44–1.00)
GFR, Estimated: >60 mL/min (ref >60)
Glucose, Bld: 116 mg/dL — ABNORMAL HIGH (ref 70–99)
Potassium: 3.8 mmol/L (ref 3.5–5.1)
Sodium: 138 mmol/L (ref 135–145)
Total Bilirubin: 0.5 mg/dL (ref 0.3–1.2)
Total Protein: 7.4 g/dL (ref 6.5–8.1)

## 2022-01-28 MED ORDER — SODIUM CHLORIDE 0.9 % IV SOLN
Freq: Once | INTRAVENOUS | Status: AC
  Filled 2022-01-28: qty 250

## 2022-01-28 MED ORDER — TAMOXIFEN CITRATE 20 MG PO TABS: 20.0000 mg | ORAL_TABLET | Freq: Every day | ORAL | 2 refills | Status: DC

## 2022-01-28 MED ORDER — DIPHENHYDRAMINE HCL 25 MG PO CAPS
50.0000 mg | ORAL_CAPSULE | Freq: Once | ORAL | Status: AC
  Administered 2022-01-28: 50 mg via ORAL
  Filled 2022-01-28: qty 2

## 2022-01-28 MED ORDER — HEPARIN SOD (PORK) LOCK FLUSH 100 UNIT/ML IV SOLN
500.0000 [IU] | Freq: Once | INTRAVENOUS | Status: AC | PRN
  Administered 2022-01-28: 500 [IU]
  Filled 2022-01-28: qty 5

## 2022-01-28 MED ORDER — SODIUM CHLORIDE 0.9 % IV SOLN
3.6000 mg/kg | Freq: Once | INTRAVENOUS | Status: AC
  Administered 2022-01-28: 260 mg via INTRAVENOUS
  Filled 2022-01-28: qty 5

## 2022-01-28 MED ORDER — ACETAMINOPHEN 325 MG PO TABS
650.0000 mg | ORAL_TABLET | Freq: Once | ORAL | Status: AC
  Administered 2022-01-28: 650 mg via ORAL
  Filled 2022-01-28: qty 2

## 2022-01-28 NOTE — Patient Instructions (Signed)
[facility]  Discharge Instructions: Thank you for choosing [facility] to provide your oncology and hematology care.  If you have a lab appointment with the Cancer Center, please go directly to the Cancer Center and check in at the registration area.  Wear comfortable clothing and clothing appropriate for easy access to any Portacath or PICC line.   We strive to give you quality time with your provider. You may need to reschedule your appointment if you arrive late (15 or more minutes).  Arriving late affects you and other patients whose appointments are after yours.  Also, if you miss three or more appointments without notifying the office, you may be dismissed from the clinic at the provider's discretion.      For prescription refill requests, have your pharmacy contact our office and allow 72 hours for refills to be completed.       To help prevent nausea and vomiting after your treatment, we encourage you to take your nausea medication as directed.  BELOW ARE SYMPTOMS THAT SHOULD BE REPORTED IMMEDIATELY: *FEVER GREATER THAN 100.4 F (38 C) OR HIGHER *CHILLS OR SWEATING *NAUSEA AND VOMITING THAT IS NOT CONTROLLED WITH YOUR NAUSEA MEDICATION *UNUSUAL SHORTNESS OF BREATH *UNUSUAL BRUISING OR BLEEDING *URINARY PROBLEMS (pain or burning when urinating, or frequent urination) *BOWEL PROBLEMS (unusual diarrhea, constipation, pain near the anus) TENDERNESS IN MOUTH AND THROAT WITH OR WITHOUT PRESENCE OF ULCERS (sore throat, sores in mouth, or a toothache) UNUSUAL RASH, SWELLING OR PAIN  UNUSUAL VAGINAL DISCHARGE OR ITCHING   Items with * indicate a potential emergency and should be followed up as soon as possible or go to the Emergency Department if any problems should occur.  Please show the CHEMOTHERAPY ALERT CARD or IMMUNOTHERAPY ALERT CARD at check-in to the Emergency Department and triage nurse.  Should you have questions after your  visit or need to cancel or reschedule your appointment, please contact [facility]  [phone] and follow the prompts.  Office hours are 8:00 a.m. to 4:30 p.m. Monday - Friday. Please note that voicemails left after 4:00 p.m. may not be returned until the following business day.  We are closed weekends and major holidays. You have access to a nurse at all times for urgent questions. Please call the main number to the clinic [phone] and follow the prompts.  For any non-urgent questions, you may also contact your provider using MyChart. We now offer e-Visits for anyone 18 and older to request care online for non-urgent symptoms. For details visit [email or web address].   Also download the MyChart app! Go to the app store, search "MyChart", open the app, select [facility], and log in with your MyChart username and password.  Masks are optional in the cancer centers. If you would like for your care team to wear a mask while they are taking care of you, please let them know. For doctor visits, patients may have with them one support person who is at least 68 years old. At this time, visitors are not allowed in the infusion area.   

## 2022-01-28 NOTE — Assessment & Plan Note (Signed)
Bone density results were reviewed with patient.   Recommend calcium 1200 mg  and vitamin D  daily supplementation.  

## 2022-01-28 NOTE — Progress Notes (Signed)
Nutrition Follow-up:  Patient with breast cancer currently receiving kadcyla.    Met with patient today in infusion.  Patient reports that she is eating more.  "I have gained weight."  Drinking 3 boost per day.  Says she ate meatloaf, mashed potatoes and veggies for dinner last night.  Denies any nutrition impact symptoms    Medications: reviewed  Labs: reviewed  Anthropometrics:   Weight 164 lb today  161 lb on 5/23 183 lb on 10/17 184 lb on 04/15/22  11% weight loss in the as last 10 months  NUTRITION DIAGNOSIS: Inadequate oral intake stable   INTERVENTION:  Encouraged well balanced diet of lean protein and plant foods.     MONITORING, EVALUATION, GOAL: weight trends, intake   NEXT VISIT: ~ 6 weeks during treatment  Debra [name] B. Zenia Resides, [place], [place] Registered Dietitian [phone]

## 2022-01-28 NOTE — Assessment & Plan Note (Addendum)
Right breast cT1c cN0 invasive carcinoma, ER/PR positive, HER2 positive tumor size appears larger, 2.5cm,  T2  on MRI,  Baseline normal CA 27-29, CEA 15-3 S/p TCH x2, and TCHP x4 followed by lumpectomy and sentinel lymph node biopsy. ypT1a ypN0- Residual disease after neoadjuvant chemotherapy, status post adjuvant radiation Labs reviewed and discussed with patient. Proceed with Kadcyla today. Rationale and potential side effects of tamoxifen was reviewed and discussed with patient. Recommend tamoxifen 20 mg daily.  Prescription sent to pharmacy.   Recommend gynecology evaluation annually.

## 2022-01-28 NOTE — Assessment & Plan Note (Signed)
Chemotherapy plan as above 

## 2022-01-28 NOTE — Progress Notes (Signed)
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 cavity size was small. There is no left ventricular hypertrophy. Left ventricular diastolic parameters were normal. Right Ventricle: The right ventricular size is normal. No increase in right ventricular wall thickness. Right ventricular systolic function is normal. Left Atrium: Left atrial size was normal in size. Right Atrium: Right atrial size was normal in size. Pericardium: There is no evidence of pericardial effusion. Mitral Valve: The mitral valve is normal in structure. Mild mitral valve regurgitation. MV peak gradient, 4.8 mmHg. The mean mitral valve gradient is 2.0 mmHg. Tricuspid Valve: The tricuspid valve is normal in structure. Tricuspid valve regurgitation is trivial. Aortic Valve: The aortic valve is normal in structure. Aortic valve regurgitation is trivial. Aortic valve mean gradient measures 4.0 mmHg. Aortic valve peak gradient measures 7.4 mmHg. Aortic valve area, by  VTI measures 2.39 cm. Pulmonic Valve: The pulmonic valve was normal in structure. Pulmonic valve regurgitation is not visualized. Aorta: The aortic root and ascending aorta are structurally normal, with no evidence of dilitation. IAS/Shunts: No atrial level shunt detected by color flow Doppler.  LEFT VENTRICLE PLAX 2D LVIDd:         4.20 cm   Diastology LVIDs:         2.90 cm   LV e' medial:    5.77 cm/s LV PW:         1.10 cm   LV E/e' medial:  11.2 LV IVS:        0.80  cm   LV e' lateral:   8.59 cm/s LVOT diam:     2.00 cm   LV E/e' lateral: 7.5 LV SV:         49 LV SV Index:   27 LVOT Area:     3.14 cm                           3D Volume EF:                          3D EF:        56 %                          LV EDV:       86 ml                          LV ESV:       38 ml                          LV SV:        48 ml RIGHT VENTRICLE RV Basal diam:  2.60 cm RV S prime:     14.80 cm/s TAPSE (M-mode): 1.8 cm LEFT ATRIUM             Index        RIGHT ATRIUM           Index LA diam:        2.90 cm 1.61 cm/m   RA Area:     12.20 cm LA Vol (A2C):   38.2 ml 21.16 ml/m  RA Volume:   25.10 ml  13.90 ml/m LA Vol (A4C):   31.8 ml 17.61 ml/m LA Biplane Vol: 35.5 ml 19.66 ml/m  AORTIC VALVE                    PULMONIC VALVE AV Area (Vmax):    2.16 cm     PV Vmax:        0.93 m/s AV Area (Vmean):   1.89 cm     PV Vmean:       63.400 cm/s AV Area (VTI):     2.39 cm     PV VTI:         0.179 m AV Vmax:           136.00 cm/s  PV Peak grad:   3.5 mmHg AV Vmean:          90.400 cm/s  PV Mean grad:   2.0 mmHg AV VTI:            0.204 m      RVOT Peak grad: 2 mmHg AV Peak Grad:      7.4 mmHg AV Mean Grad:      4.0 mmHg LVOT Vmax:         93.30 cm/s LVOT Vmean:        54.300 cm/s LVOT VTI:          0.155 m LVOT/AV VTI ratio: 0.76  AORTA Ao Root diam: 2.70 cm MITRAL VALVE               TRICUSPID VALVE MV Area (PHT): 4.21 cm    TR Peak grad:   11.0 mmHg MV Area VTI:  2.93 cm    TR Vmax:        166.00 cm/s MV Peak grad:  4.8  mmHg MV Mean grad:  2.0 mmHg    SHUNTS MV Vmax:       1.10 m/s    Systemic VTI:  0.16 m MV Vmean:      69.9 cm/s   Systemic Diam: 2.00 cm MV Decel Time: 180 msec    Pulmonic VTI:  0.127 m MV E velocity: 64.70 cm/s MV A velocity: 90.40 cm/s MV E/A ratio:  0.72 Serafina Royals MD Electronically signed by Serafina Royals MD Signature Date/Time: 12/26/2021/12:24:16 PM    Final       cc [name], MD

## 2022-01-28 NOTE — Assessment & Plan Note (Signed)
Lower extremity ultrasound negative for DVT. Continue Lasix 20 mg daily as needed for edema.

## 2022-01-28 NOTE — Assessment & Plan Note (Signed)
Check UA and urine culture.

## 2022-01-28 NOTE — Assessment & Plan Note (Signed)
Due to chronic disease and previous chemotherapy. Hemoglobin stable.  Monitor 

## 2022-01-30 ENCOUNTER — Encounter: Payer: Self-pay | Admitting: Oncology

## 2022-01-30 LAB — URINE CULTURE: Culture: 40000 — AB

## 2022-02-01 ENCOUNTER — Other Ambulatory Visit: Payer: Self-pay | Admitting: Oncology

## 2022-02-01 MED ORDER — NITROFURANTOIN MONOHYD MACRO 100 MG PO CAPS: 100.0000 mg | ORAL_CAPSULE | Freq: Two times a day (BID) | ORAL | 0 refills | Status: DC

## 2022-02-03 ENCOUNTER — Telehealth: Payer: Self-pay

## 2022-02-03 NOTE — Telephone Encounter (Signed)
-----   Message from Earlie Server, MD sent at 02/01/2022 11:52 AM EDT ----- She has UTI.  Recommend Macrobid, Rx sent.

## 2022-02-03 NOTE — Telephone Encounter (Signed)
Called and left detailed message informing patient of urine results and Dr. Collie Siad recommendation to start antibiotic. Advised to call back with any questions or concerns

## 2022-02-04 ENCOUNTER — Encounter: Payer: Self-pay | Admitting: Oncology

## 2022-02-18 ENCOUNTER — Other Ambulatory Visit: Payer: Self-pay | Admitting: Oncology

## 2022-02-19 ENCOUNTER — Inpatient Hospital Stay: Payer: Medicare Other | Attending: Oncology

## 2022-02-19 ENCOUNTER — Inpatient Hospital Stay (HOSPITAL_BASED_OUTPATIENT_CLINIC_OR_DEPARTMENT_OTHER): Payer: Medicare Other | Admitting: Oncology

## 2022-02-19 ENCOUNTER — Inpatient Hospital Stay: Payer: Medicare Other

## 2022-02-19 ENCOUNTER — Encounter: Payer: Self-pay | Admitting: Oncology

## 2022-02-19 VITALS — BP 136/74 | HR 101 | Temp 97.5°F | Wt 157.0 lb

## 2022-02-19 VITALS — HR 98

## 2022-02-19 DIAGNOSIS — Z5112 Encounter for antineoplastic immunotherapy: Secondary | ICD-10-CM | POA: Insufficient documentation

## 2022-02-19 DIAGNOSIS — R8290 Unspecified abnormal findings in urine: Secondary | ICD-10-CM

## 2022-02-19 DIAGNOSIS — C50211 Malignant neoplasm of upper-inner quadrant of right female breast: Secondary | ICD-10-CM | POA: Insufficient documentation

## 2022-02-19 DIAGNOSIS — C50919 Malignant neoplasm of unspecified site of unspecified female breast: Secondary | ICD-10-CM

## 2022-02-19 DIAGNOSIS — Z79899 Other long term (current) drug therapy: Secondary | ICD-10-CM | POA: Insufficient documentation

## 2022-02-19 DIAGNOSIS — E876 Hypokalemia: Secondary | ICD-10-CM

## 2022-02-19 DIAGNOSIS — Z5111 Encounter for antineoplastic chemotherapy: Secondary | ICD-10-CM | POA: Diagnosis not present

## 2022-02-19 DIAGNOSIS — M8580 Other specified disorders of bone density and structure, unspecified site: Secondary | ICD-10-CM

## 2022-02-19 DIAGNOSIS — D649 Anemia, unspecified: Secondary | ICD-10-CM

## 2022-02-19 DIAGNOSIS — Z79810 Long term (current) use of selective estrogen receptor modulators (SERMs): Secondary | ICD-10-CM

## 2022-02-19 LAB — COMPREHENSIVE METABOLIC PANEL
ALT: 14 U/L (ref 0–44)
AST: 25 U/L (ref 15–41)
Albumin: 3.5 g/dL (ref 3.5–5.0)
Alkaline Phosphatase: 66 U/L (ref 38–126)
Anion gap: 8 (ref 5–15)
BUN: 17 mg/dL (ref 8–23)
CO2: 32 mmol/L (ref 22–32)
Calcium: 8.9 mg/dL (ref 8.9–10.3)
Chloride: 97 mmol/L — ABNORMAL LOW (ref 98–111)
Creatinine, Ser: 0.71 mg/dL (ref 0.44–1.00)
GFR, Estimated: >60 mL/min (ref >60)
Glucose, Bld: 105 mg/dL — ABNORMAL HIGH (ref 70–99)
Potassium: 3.8 mmol/L (ref 3.5–5.1)
Sodium: 137 mmol/L (ref 135–145)
Total Bilirubin: 0.5 mg/dL (ref 0.3–1.2)
Total Protein: 7.5 g/dL (ref 6.5–8.1)

## 2022-02-19 LAB — CBC WITH DIFFERENTIAL/PLATELET
Abs Immature Granulocytes: 0.01 10*3/uL (ref 0.00–0.07)
Basophils Absolute: 0.1 10*3/uL (ref 0.0–0.1)
Basophils Relative: 1 %
Eosinophils Absolute: 0.3 10*3/uL (ref 0.0–0.5)
Eosinophils Relative: 4 %
HCT: 33.2 % — ABNORMAL LOW (ref 36.0–46.0)
Hemoglobin: 10.3 g/dL — ABNORMAL LOW (ref 12.0–15.0)
Immature Granulocytes: 0 %
Lymphocytes Relative: 32 %
Lymphs Abs: 2.1 10*3/uL (ref 0.7–4.0)
MCH: 28 pg (ref 26.0–34.0)
MCHC: 31 g/dL (ref 30.0–36.0)
MCV: 90.2 fL (ref 80.0–100.0)
Monocytes Absolute: 0.5 10*3/uL (ref 0.1–1.0)
Monocytes Relative: 8 %
Neutro Abs: 3.6 10*3/uL (ref 1.7–7.7)
Neutrophils Relative %: 55 %
Platelets: 276 10*3/uL (ref 150–400)
RBC: 3.68 MIL/uL — ABNORMAL LOW (ref 3.87–5.11)
RDW: 15.2 % (ref 11.5–15.5)
WBC: 6.5 10*3/uL (ref 4.0–10.5)
nRBC: 0 % (ref 0.0–0.2)

## 2022-02-19 MED ORDER — ACETAMINOPHEN 325 MG PO TABS
650.0000 mg | ORAL_TABLET | Freq: Once | ORAL | Status: AC
  Administered 2022-02-19: 650 mg via ORAL
  Filled 2022-02-19: qty 2

## 2022-02-19 MED ORDER — SODIUM CHLORIDE 0.9 % IV SOLN
3.6000 mg/kg | Freq: Once | INTRAVENOUS | Status: AC
  Administered 2022-02-19: 260 mg via INTRAVENOUS
  Filled 2022-02-19: qty 8

## 2022-02-19 MED ORDER — DIPHENHYDRAMINE HCL 25 MG PO CAPS
50.0000 mg | ORAL_CAPSULE | Freq: Once | ORAL | Status: AC
  Administered 2022-02-19: 50 mg via ORAL
  Filled 2022-02-19: qty 2

## 2022-02-19 MED ORDER — HEPARIN SOD (PORK) LOCK FLUSH 100 UNIT/ML IV SOLN
500.0000 [IU] | Freq: Once | INTRAVENOUS | Status: AC | PRN
  Administered 2022-02-19: 500 [IU]
  Filled 2022-02-19: qty 5

## 2022-02-19 MED ORDER — SODIUM CHLORIDE 0.9 % IV SOLN
Freq: Once | INTRAVENOUS | Status: AC
  Filled 2022-02-19: qty 250

## 2022-02-19 NOTE — Assessment & Plan Note (Signed)
Bone density results were reviewed with patient.   Recommend calcium 1200 mg  and vitamin D  daily supplementation.

## 2022-02-19 NOTE — Patient Instructions (Signed)
[facility]  Discharge Instructions: Thank you for choosing [facility] to provide your oncology and hematology care.  If you have a lab appointment with the Cancer Center, please go directly to the Cancer Center and check in at the registration area.  Wear comfortable clothing and clothing appropriate for easy access to any Portacath or PICC line.   We strive to give you quality time with your provider. You may need to reschedule your appointment if you arrive late (15 or more minutes).  Arriving late affects you and other patients whose appointments are after yours.  Also, if you miss three or more appointments without notifying the office, you may be dismissed from the clinic at the provider's discretion.      For prescription refill requests, have your pharmacy contact our office and allow 72 hours for refills to be completed.    Today you received the following chemotherapy and/or immunotherapy agents- trastuzumab      To help prevent nausea and vomiting after your treatment, we encourage you to take your nausea medication as directed.  BELOW ARE SYMPTOMS THAT SHOULD BE REPORTED IMMEDIATELY: *FEVER GREATER THAN 100.4 F (38 C) OR HIGHER *CHILLS OR SWEATING *NAUSEA AND VOMITING THAT IS NOT CONTROLLED WITH YOUR NAUSEA MEDICATION *UNUSUAL SHORTNESS OF BREATH *UNUSUAL BRUISING OR BLEEDING *URINARY PROBLEMS (pain or burning when urinating, or frequent urination) *BOWEL PROBLEMS (unusual diarrhea, constipation, pain near the anus) TENDERNESS IN MOUTH AND THROAT WITH OR WITHOUT PRESENCE OF ULCERS (sore throat, sores in mouth, or a toothache) UNUSUAL RASH, SWELLING OR PAIN  UNUSUAL VAGINAL DISCHARGE OR ITCHING   Items with * indicate a potential emergency and should be followed up as soon as possible or go to the Emergency Department if any problems should occur.  Please show the CHEMOTHERAPY ALERT CARD or IMMUNOTHERAPY ALERT CARD at check-in  to the Emergency Department and triage nurse.  Should you have questions after your visit or need to cancel or reschedule your appointment, please contact [facility]  [phone] and follow the prompts.  Office hours are 8:00 a.m. to 4:30 p.m. Monday - Friday. Please note that voicemails left after 4:00 p.m. may not be returned until the following business day.  We are closed weekends and major holidays. You have access to a nurse at all times for urgent questions. Please call the main number to the clinic [phone] and follow the prompts.  For any non-urgent questions, you may also contact your provider using MyChart. We now offer e-Visits for anyone 18 and older to request care online for non-urgent symptoms. For details visit [email or web address].   Also download the MyChart app! Go to the app store, search "MyChart", open the app, select [facility], and log in with your MyChart username and password.  Masks are optional in the cancer centers. If you would like for your care team to wear a mask while they are taking care of you, please let them know. For doctor visits, patients may have with them one support person who is at least 68 years old. At this time, visitors are not allowed in the infusion area.   

## 2022-02-19 NOTE — Assessment & Plan Note (Signed)
Right breast cT1c cN0 invasive carcinoma, ER/PR positive, HER2 positive tumor size appears larger, 2.5cm,  T2  on MRI,  Baseline normal CA 27-29, CEA 15-3 S/p TCH x2, and TCHP x4 followed by lumpectomy and sentinel lymph node biopsy. ypT1a ypN0- Residual disease after neoadjuvant chemotherapy, status post adjuvant radiation Labs reviewed and discussed with patient. Proceed with Kadcyla today.  Continue tamoxifen 20 mg daily.-Patient is on Eliquis for atrial fibrillation. Recommend gynecology evaluation annually-refer to gynecology 

## 2022-02-19 NOTE — Progress Notes (Signed)
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 estimation, is 65 to 70%. The left ventricle has normal function. The left ventricle has no regional wall motion abnormalities. The left ventricular internal cavity size was small. There is no left ventricular hypertrophy. Left ventricular diastolic parameters were normal. Right Ventricle: The right ventricular size is normal. No increase in right ventricular wall thickness. Right ventricular systolic function is normal. Left Atrium: Left atrial size was normal in size. Right Atrium: Right atrial size was normal in size. Pericardium: There is no evidence of pericardial effusion. Mitral Valve: The mitral valve is normal in structure. Mild mitral valve regurgitation. MV peak gradient, 4.8 mmHg. The mean mitral valve gradient is 2.0 mmHg. Tricuspid Valve: The tricuspid valve is normal in structure. Tricuspid valve regurgitation is trivial.  Aortic Valve: The aortic valve is normal in structure. Aortic valve regurgitation is trivial. Aortic valve mean gradient measures 4.0 mmHg. Aortic valve peak gradient measures 7.4 mmHg. Aortic valve area, by VTI measures 2.39 cm. Pulmonic Valve: The pulmonic valve was normal in structure. Pulmonic valve regurgitation is not visualized.  Aorta: The aortic root and ascending aorta are structurally normal, with no evidence of dilitation. IAS/Shunts: No atrial level shunt detected by color flow Doppler.  LEFT VENTRICLE PLAX 2D LVIDd:         4.20 cm   Diastology LVIDs:         2.90 cm   LV e' medial:    5.77 cm/s LV PW:         1.10 cm   LV E/e' medial:  11.2 LV IVS:        0.80 cm   LV e' lateral:   8.59 cm/s LVOT diam:     2.00 cm   LV E/e' lateral: 7.5 LV SV:         49 LV SV Index:   27 LVOT Area:     3.14 cm                           3D Volume EF:                          3D EF:        56 %                          LV EDV:       86 ml                          LV ESV:       38 ml                          LV SV:        48 ml RIGHT VENTRICLE RV Basal diam:  2.60 cm RV S prime:     14.80 cm/s TAPSE (M-mode): 1.8 cm LEFT ATRIUM             Index        RIGHT ATRIUM           Index LA diam:        2.90 cm 1.61 cm/m   RA Area:     12.20 cm LA Vol (A2C):   38.2 ml 21.16 ml/m  RA Volume:   25.10 ml  13.90 ml/m LA Vol (A4C):   31.8 ml 17.61 ml/m LA Biplane Vol: 35.5 ml 19.66 ml/m  AORTIC VALVE                    PULMONIC VALVE AV Area (Vmax):    2.16 cm     PV Vmax:        0.93 m/s AV Area (Vmean):   1.89 cm     PV Vmean:       63.400 cm/s AV Area (VTI):     2.39 cm     PV VTI:         0.179 m AV Vmax:           136.00 cm/s  PV Peak grad:   3.5 mmHg AV Vmean:          90.400 cm/s  PV Mean grad:   2.0 mmHg AV VTI:            0.204 m      RVOT Peak grad: 2 mmHg AV Peak Grad:      7.4 mmHg AV  Mean Grad:      4.0 mmHg LVOT Vmax:         93.30 cm/s LVOT Vmean:        54.300 cm/s LVOT VTI:          0.155 m LVOT/AV VTI  ratio: 0.76  AORTA Ao Root diam: 2.70 cm MITRAL VALVE               TRICUSPID VALVE MV Area (PHT): 4.21 cm    TR Peak grad:   11.0 mmHg MV Area VTI:   2.93 cm    TR Vmax:        166.00 cm/s MV Peak grad:  4.8 mmHg MV Mean grad:  2.0 mmHg    SHUNTS MV Vmax:       1.10 m/s    Systemic VTI:  0.16 m MV Vmean:      69.9 cm/s   Systemic Diam: 2.00 cm MV Decel Time: 180 msec    Pulmonic VTI:  0.127 m MV E velocity: 64.70 cm/s MV A velocity: 90.40 cm/s MV E/A ratio:  0.72 Serafina Royals MD Electronically signed by Serafina Royals MD Signature Date/Time: 12/26/2021/12:24:16 PM    Final       cc [name], MD

## 2022-02-19 NOTE — Assessment & Plan Note (Signed)
Chemotherapy plan as above 

## 2022-02-19 NOTE — Assessment & Plan Note (Signed)
Due to chronic disease and previous chemotherapy. Hemoglobin stable.  Monitor 

## 2022-02-19 NOTE — Assessment & Plan Note (Addendum)
Continue potassium supplementation.  20 mEq daily 

## 2022-02-25 ENCOUNTER — Encounter: Payer: Self-pay | Admitting: Oncology

## 2022-02-25 ENCOUNTER — Telehealth: Payer: Self-pay | Admitting: Oncology

## 2022-02-25 NOTE — Telephone Encounter (Signed)
Patient called in and requested her next treatment day be moved out by one day (from 7/26 tp 7/27).   Please advise

## 2022-02-25 NOTE — Telephone Encounter (Signed)
Debra [name] with Dr. Tasia Catchings to move appt. Thanks

## 2022-03-06 ENCOUNTER — Other Ambulatory Visit: Payer: Self-pay | Admitting: Oncology

## 2022-03-06 NOTE — Telephone Encounter (Signed)
Component Ref Range & Units 2 wk ago (02/19/22) 1 mo ago (01/28/22) 1 mo ago (01/07/22) 2 mo ago (12/24/21) 3 mo ago (11/25/21) 4 mo ago (11/04/21) 4 mo ago (10/14/21)  Potassium 3.5 - 5.1 mmol/L 3.8  3.8  3.4 Low   3.8  3.6  4.3  4.0

## 2022-03-10 ENCOUNTER — Other Ambulatory Visit: Payer: Self-pay

## 2022-03-12 ENCOUNTER — Ambulatory Visit: Payer: Medicare Other | Admitting: Oncology

## 2022-03-12 ENCOUNTER — Other Ambulatory Visit: Payer: Medicare Other

## 2022-03-12 ENCOUNTER — Ambulatory Visit: Payer: Medicare Other

## 2022-03-13 ENCOUNTER — Inpatient Hospital Stay (HOSPITAL_BASED_OUTPATIENT_CLINIC_OR_DEPARTMENT_OTHER): Payer: Medicare Other | Admitting: Oncology

## 2022-03-13 ENCOUNTER — Inpatient Hospital Stay: Payer: Medicare Other

## 2022-03-13 ENCOUNTER — Other Ambulatory Visit: Payer: Self-pay

## 2022-03-13 ENCOUNTER — Encounter: Payer: Self-pay | Admitting: Oncology

## 2022-03-13 VITALS — BP 106/60 | HR 84 | Temp 98.1°F | Wt 162.0 lb

## 2022-03-13 DIAGNOSIS — C50919 Malignant neoplasm of unspecified site of unspecified female breast: Secondary | ICD-10-CM

## 2022-03-13 DIAGNOSIS — N390 Urinary tract infection, site not specified: Secondary | ICD-10-CM

## 2022-03-13 DIAGNOSIS — Z5111 Encounter for antineoplastic chemotherapy: Secondary | ICD-10-CM | POA: Diagnosis not present

## 2022-03-13 DIAGNOSIS — E876 Hypokalemia: Secondary | ICD-10-CM

## 2022-03-13 DIAGNOSIS — Z5112 Encounter for antineoplastic immunotherapy: Secondary | ICD-10-CM | POA: Diagnosis not present

## 2022-03-13 DIAGNOSIS — M8580 Other specified disorders of bone density and structure, unspecified site: Secondary | ICD-10-CM

## 2022-03-13 DIAGNOSIS — D649 Anemia, unspecified: Secondary | ICD-10-CM

## 2022-03-13 DIAGNOSIS — Z1731 Malignant neoplasm of unspecified site of unspecified female breast: Secondary | ICD-10-CM

## 2022-03-13 LAB — CBC WITH DIFFERENTIAL/PLATELET
Abs Immature Granulocytes: 0.02 10*3/uL (ref 0.00–0.07)
Basophils Absolute: 0.1 10*3/uL (ref 0.0–0.1)
Basophils Relative: 1 %
Eosinophils Absolute: 0.2 10*3/uL (ref 0.0–0.5)
Eosinophils Relative: 3 %
HCT: 30.6 % — ABNORMAL LOW (ref 36.0–46.0)
Hemoglobin: 9.8 g/dL — ABNORMAL LOW (ref 12.0–15.0)
Immature Granulocytes: 0 %
Lymphocytes Relative: 36 %
Lymphs Abs: 2.9 10*3/uL (ref 0.7–4.0)
MCH: 28.8 pg (ref 26.0–34.0)
MCHC: 32 g/dL (ref 30.0–36.0)
MCV: 90 fL (ref 80.0–100.0)
Monocytes Absolute: 0.6 10*3/uL (ref 0.1–1.0)
Monocytes Relative: 7 %
Neutro Abs: 4.3 10*3/uL (ref 1.7–7.7)
Neutrophils Relative %: 53 %
Platelets: 252 10*3/uL (ref 150–400)
RBC: 3.4 MIL/uL — ABNORMAL LOW (ref 3.87–5.11)
RDW: 15.8 % — ABNORMAL HIGH (ref 11.5–15.5)
WBC: 8 10*3/uL (ref 4.0–10.5)
nRBC: 0 % (ref 0.0–0.2)

## 2022-03-13 LAB — URINALYSIS, COMPLETE (UACMP) WITH MICROSCOPIC
Bacteria, UA: NONE SEEN
Bilirubin Urine: NEGATIVE
Glucose, UA: NEGATIVE mg/dL
Hgb urine dipstick: NEGATIVE
Ketones, ur: NEGATIVE mg/dL
Leukocytes,Ua: NEGATIVE
Nitrite: NEGATIVE
Protein, ur: NEGATIVE mg/dL
Specific Gravity, Urine: 1.019 (ref 1.005–1.030)
pH: 6 (ref 5.0–8.0)

## 2022-03-13 LAB — COMPREHENSIVE METABOLIC PANEL
ALT: 14 U/L (ref 0–44)
AST: 24 U/L (ref 15–41)
Albumin: 3.5 g/dL (ref 3.5–5.0)
Alkaline Phosphatase: 61 U/L (ref 38–126)
Anion gap: 3 — ABNORMAL LOW (ref 5–15)
BUN: 14 mg/dL (ref 8–23)
CO2: 32 mmol/L (ref 22–32)
Calcium: 8.6 mg/dL — ABNORMAL LOW (ref 8.9–10.3)
Chloride: 100 mmol/L (ref 98–111)
Creatinine, Ser: 0.8 mg/dL (ref 0.44–1.00)
GFR, Estimated: >60 mL/min (ref >60)
Glucose, Bld: 99 mg/dL (ref 70–99)
Potassium: 3.7 mmol/L (ref 3.5–5.1)
Sodium: 135 mmol/L (ref 135–145)
Total Bilirubin: 0.5 mg/dL (ref 0.3–1.2)
Total Protein: 7.2 g/dL (ref 6.5–8.1)

## 2022-03-13 MED ORDER — DIPHENHYDRAMINE HCL 25 MG PO CAPS
50.0000 mg | ORAL_CAPSULE | Freq: Once | ORAL | Status: AC
  Administered 2022-03-13: 50 mg via ORAL
  Filled 2022-03-13: qty 2

## 2022-03-13 MED ORDER — ACETAMINOPHEN 325 MG PO TABS
650.0000 mg | ORAL_TABLET | Freq: Once | ORAL | Status: AC
  Administered 2022-03-13: 650 mg via ORAL
  Filled 2022-03-13: qty 2

## 2022-03-13 MED ORDER — SODIUM CHLORIDE 0.9 % IV SOLN
3.6000 mg/kg | Freq: Once | INTRAVENOUS | Status: AC
  Administered 2022-03-13: 260 mg via INTRAVENOUS
  Filled 2022-03-13: qty 5

## 2022-03-13 MED ORDER — HEPARIN SOD (PORK) LOCK FLUSH 100 UNIT/ML IV SOLN
500.0000 [IU] | Freq: Once | INTRAVENOUS | Status: AC | PRN
  Administered 2022-03-13: 500 [IU]
  Filled 2022-03-13: qty 5

## 2022-03-13 MED ORDER — SODIUM CHLORIDE 0.9 % IV SOLN
Freq: Once | INTRAVENOUS | Status: AC
  Filled 2022-03-13: qty 250

## 2022-03-13 NOTE — Assessment & Plan Note (Signed)
Recommend calcium 1200 mg  and vitamin D  daily supplementation.  

## 2022-03-13 NOTE — Assessment & Plan Note (Signed)
Continue potassium supplementation.  20 mEq daily 

## 2022-03-13 NOTE — Assessment & Plan Note (Signed)
Chemotherapy plan as above 

## 2022-03-13 NOTE — Assessment & Plan Note (Signed)
Due to chronic disease and previous chemotherapy. Hemoglobin stable.  Monitor 

## 2022-03-13 NOTE — Assessment & Plan Note (Signed)
Right breast cT1c cN0 invasive carcinoma, ER/PR positive, HER2 positive tumor size appears larger, 2.5cm,  T2  on MRI,  Baseline normal CA 27-29, CEA 15-3 S/p TCH x2, and TCHP x4 followed by lumpectomy and sentinel lymph node biopsy. ypT1a ypN0- Residual disease after neoadjuvant chemotherapy, status post adjuvant radiation Labs reviewed and discussed with patient. Proceed with Kadcyla today.  Continue tamoxifen 20 mg daily.-Patient is on Eliquis for atrial fibrillation. Recommend gynecology evaluation annually-refer to gynecology 

## 2022-03-13 NOTE — Progress Notes (Signed)
Nutrition Follow-up:   Patient with breast cancer currently receiving kadcyla.    Met with patient during infusion.  Patient reports that her appetite is good.  Although food has salty taste to it patient states "I am still eating."      Medications: reviewed  Labs: reviewed  Anthropometrics:   Weight 162 lb today, stable  164 lb on 6/13 161 lb on 5/23 183 lb on 10/17 184 lb on 8/29  "I don't want to gain all my weight back."    NUTRITION DIAGNOSIS: Inadequate oral intake stable   INTERVENTION:  With appetite good and weight stable can decrease/stop oral nutrition supplements Discussed ways to help with taste change.  Handout given Encouraged patient with good appetite to focus on increasing plant foods in her diet.  Discussed ways to do this today.  Handout provided    MONITORING, EVALUATION, GOAL: weight trends, intake   NEXT VISIT: as needed  Debra [name], [place], [place] Registered Dietitian [phone]

## 2022-03-13 NOTE — Progress Notes (Signed)
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 Right Ventricle: The right ventricular size is normal. No increase in right ventricular wall thickness. Right ventricular systolic function is normal. Left Atrium: Left atrial size was normal in size. Right Atrium: Right atrial size was normal in size. Pericardium: There is no evidence of pericardial effusion. Mitral Valve: The mitral valve is normal in structure. Mild mitral valve regurgitation. MV peak gradient, 4.8 mmHg. The mean mitral valve gradient is 2.0 mmHg. Tricuspid Valve: The tricuspid valve is normal in structure. Tricuspid valve regurgitation is trivial. Aortic Valve: The aortic valve is normal in structure. Aortic valve regurgitation is trivial. Aortic valve mean gradient measures 4.0 mmHg. Aortic valve peak gradient measures 7.4 mmHg. Aortic valve area, by VTI measures 2.39 cm. Pulmonic Valve: The  pulmonic valve was normal in structure. Pulmonic valve regurgitation is not visualized. Aorta: The aortic root and ascending aorta are structurally normal, with no evidence of dilitation. IAS/Shunts: No atrial level shunt detected by color flow Doppler.  LEFT VENTRICLE PLAX 2D LVIDd:         4.20 cm   Diastology LVIDs:         2.90 cm  LV e' medial:    5.77 cm/s LV PW:         1.10 cm   LV E/e' medial:  11.2 LV IVS:        0.80 cm   LV e' lateral:   8.59 cm/s LVOT diam:     2.00 cm   LV E/e' lateral: 7.5 LV SV:         49 LV SV Index:   27 LVOT Area:     3.14 cm                           3D Volume EF:                          3D EF:        56 %                          LV EDV:       86 ml                          LV ESV:       38 ml                          LV SV:        48 ml RIGHT VENTRICLE RV Basal diam:  2.60 cm RV S prime:     14.80 cm/s TAPSE (M-mode): 1.8 cm LEFT ATRIUM             Index        RIGHT ATRIUM           Index LA diam:        2.90 cm 1.61 cm/m   RA Area:     12.20 cm LA Vol (A2C):   38.2 ml 21.16 ml/m  RA Volume:   25.10 ml  13.90 ml/m LA Vol (A4C):   31.8 ml 17.61 ml/m LA Biplane Vol: 35.5 ml 19.66 ml/m  AORTIC VALVE                    PULMONIC VALVE AV Area (Vmax):    2.16 cm     PV Vmax:        0.93 m/s AV Area (Vmean):   1.89 cm     PV Vmean:       63.400 cm/s AV Area (VTI):     2.39 cm     PV VTI:         0.179 m AV Vmax:           136.00 cm/s  PV Peak grad:   3.5 mmHg AV Vmean:          90.400 cm/s  PV Mean grad:   2.0 mmHg AV VTI:            0.204 m      RVOT Peak grad: 2 mmHg AV Peak Grad:      7.4 mmHg AV Mean Grad:      4.0 mmHg LVOT Vmax:         93.30 cm/s LVOT Vmean:        54.300 cm/s LVOT VTI:          0.155 m LVOT/AV VTI ratio: 0.76  AORTA Ao Root diam: 2.70 cm MITRAL  VALVE               TRICUSPID VALVE MV Area (PHT): 4.21 cm    TR Peak grad:   11.0 mmHg MV Area VTI:   2.93 cm    TR Vmax:        166.00 cm/s MV Peak grad:  4.8 mmHg MV Mean grad:  2.0 mmHg    SHUNTS MV  Vmax:       1.10 m/s    Systemic VTI:  0.16 m MV Vmean:      69.9 cm/s   Systemic Diam: 2.00 cm MV Decel Time: 180 msec    Pulmonic VTI:  0.127 m MV E velocity: 64.70 cm/s MV A velocity: 90.40 cm/s MV E/A ratio:  0.72 Serafina Royals MD Electronically signed by Serafina Royals MD Signature Date/Time: 12/26/2021/12:24:16 PM    Final       cc [name], MD

## 2022-03-18 ENCOUNTER — Other Ambulatory Visit: Payer: Self-pay

## 2022-03-19 ENCOUNTER — Ambulatory Visit: Payer: Medicare Other

## 2022-03-27 ENCOUNTER — Other Ambulatory Visit: Payer: Self-pay | Admitting: Oncology

## 2022-03-27 ENCOUNTER — Encounter: Payer: Self-pay | Admitting: Oncology

## 2022-03-27 DIAGNOSIS — C50919 Malignant neoplasm of unspecified site of unspecified female breast: Secondary | ICD-10-CM

## 2022-03-27 NOTE — Progress Notes (Signed)
ON PATHWAY REGIM[facility] - Breast  No Change  Continue With Treatment as Ordered.  Original Decision Date/Time: 08/07/2021 19:45     A cycle is every 21 days:     Ado-trastuzumab emtansine   **Always confirm dose/schedule in your pharmacy ordering system**  Patient Characteristics: Post-Neoadjuvant Therapy and Resection, HER2 Positive, ER Positive, Residual Disease, Adjuvant Targeted Therapy After Neoadjuvant Chemo/Targeted Therapy Therapeutic Status: Post-Neoadjuvant Therapy and Resection Residual Invasive Disease Post-Neoadjuvant Therapy<= Yes ER Status: Positive (+) HER2 Status: Positive (+) PR Status: Positive (+) Intent of Therapy: Curative Intent, Discussed with Patient

## 2022-03-28 ENCOUNTER — Other Ambulatory Visit: Payer: Self-pay

## 2022-03-29 ENCOUNTER — Other Ambulatory Visit: Payer: Self-pay | Admitting: Oncology

## 2022-03-31 ENCOUNTER — Encounter: Payer: Self-pay | Admitting: Oncology

## 2022-03-31 NOTE — Telephone Encounter (Signed)
Component Ref Range & Units 2 wk ago (03/13/22) 1 mo ago (02/19/22) 2 mo ago (01/28/22) 2 mo ago (01/07/22) 3 mo ago (12/24/21) 4 mo ago (11/25/21) 4 mo ago (11/04/21)  Potassium 3.5 - 5.1 mmol/L 3.7  3.8  3.8  3.4 Low   3.8  3.6  4.3

## 2022-04-03 ENCOUNTER — Inpatient Hospital Stay: Payer: Medicare PPO | Attending: Oncology

## 2022-04-03 ENCOUNTER — Inpatient Hospital Stay: Payer: Medicare PPO

## 2022-04-03 ENCOUNTER — Inpatient Hospital Stay (HOSPITAL_BASED_OUTPATIENT_CLINIC_OR_DEPARTMENT_OTHER): Payer: Medicare PPO | Admitting: Nurse Practitioner

## 2022-04-03 DIAGNOSIS — C50211 Malignant neoplasm of upper-inner quadrant of right female breast: Secondary | ICD-10-CM | POA: Insufficient documentation

## 2022-04-03 DIAGNOSIS — C50919 Malignant neoplasm of unspecified site of unspecified female breast: Secondary | ICD-10-CM

## 2022-04-03 DIAGNOSIS — Z79899 Other long term (current) drug therapy: Secondary | ICD-10-CM | POA: Insufficient documentation

## 2022-04-03 DIAGNOSIS — Z5112 Encounter for antineoplastic immunotherapy: Secondary | ICD-10-CM | POA: Diagnosis present

## 2022-04-03 LAB — COMPREHENSIVE METABOLIC PANEL
ALT: 16 U/L (ref 0–44)
AST: 26 U/L (ref 15–41)
Albumin: 3.7 g/dL (ref 3.5–5.0)
Alkaline Phosphatase: 64 U/L (ref 38–126)
Anion gap: 9 (ref 5–15)
BUN: 15 mg/dL (ref 8–23)
CO2: 32 mmol/L (ref 22–32)
Calcium: 8.9 mg/dL (ref 8.9–10.3)
Chloride: 97 mmol/L — ABNORMAL LOW (ref 98–111)
Creatinine, Ser: 0.92 mg/dL (ref 0.44–1.00)
GFR, Estimated: >60 mL/min (ref >60)
Glucose, Bld: 105 mg/dL — ABNORMAL HIGH (ref 70–99)
Potassium: 3.4 mmol/L — ABNORMAL LOW (ref 3.5–5.1)
Sodium: 138 mmol/L (ref 135–145)
Total Bilirubin: 0.4 mg/dL (ref 0.3–1.2)
Total Protein: 7.8 g/dL (ref 6.5–8.1)

## 2022-04-03 LAB — CBC WITH DIFFERENTIAL/PLATELET
Abs Immature Granulocytes: 0.03 10*3/uL (ref 0.00–0.07)
Basophils Absolute: 0 10*3/uL (ref 0.0–0.1)
Basophils Relative: 0 %
Eosinophils Absolute: 0.2 10*3/uL (ref 0.0–0.5)
Eosinophils Relative: 2 %
HCT: 33.3 % — ABNORMAL LOW (ref 36.0–46.0)
Hemoglobin: 10.6 g/dL — ABNORMAL LOW (ref 12.0–15.0)
Immature Granulocytes: 0 %
Lymphocytes Relative: 30 %
Lymphs Abs: 2.6 10*3/uL (ref 0.7–4.0)
MCH: 28.8 pg (ref 26.0–34.0)
MCHC: 31.8 g/dL (ref 30.0–36.0)
MCV: 90.5 fL (ref 80.0–100.0)
Monocytes Absolute: 0.6 10*3/uL (ref 0.1–1.0)
Monocytes Relative: 7 %
Neutro Abs: 5.1 10*3/uL (ref 1.7–7.7)
Neutrophils Relative %: 61 %
Platelets: 238 10*3/uL (ref 150–400)
RBC: 3.68 MIL/uL — ABNORMAL LOW (ref 3.87–5.11)
RDW: 15.9 % — ABNORMAL HIGH (ref 11.5–15.5)
WBC: 8.5 10*3/uL (ref 4.0–10.5)
nRBC: 0 % (ref 0.0–0.2)

## 2022-04-03 MED ORDER — HEPARIN SOD (PORK) LOCK FLUSH 100 UNIT/ML IV SOLN
500.0000 [IU] | Freq: Once | INTRAVENOUS | Status: AC | PRN
  Administered 2022-04-03: 500 [IU]
  Filled 2022-04-03: qty 5

## 2022-04-03 MED ORDER — SODIUM CHLORIDE 0.9 % IV SOLN
Freq: Once | INTRAVENOUS | Status: AC
  Filled 2022-04-03: qty 250

## 2022-04-03 MED ORDER — ACETAMINOPHEN 325 MG PO TABS
650.0000 mg | ORAL_TABLET | Freq: Once | ORAL | Status: AC
  Administered 2022-04-03: 650 mg via ORAL
  Filled 2022-04-03: qty 2

## 2022-04-03 MED ORDER — DIPHENHYDRAMINE HCL 25 MG PO CAPS
50.0000 mg | ORAL_CAPSULE | Freq: Once | ORAL | Status: AC
  Administered 2022-04-03: 50 mg via ORAL
  Filled 2022-04-03: qty 2

## 2022-04-03 MED ORDER — PROCHLORPERAZINE EDISYLATE 10 MG/2ML IJ SOLN: 10.0000 mg | Freq: Once | INTRAMUSCULAR | Status: DC

## 2022-04-03 MED ORDER — PROCHLORPERAZINE MALEATE 10 MG PO TABS
10.0000 mg | ORAL_TABLET | Freq: Once | ORAL | Status: AC
  Administered 2022-04-03: 10 mg via ORAL
  Filled 2022-04-03: qty 1

## 2022-04-03 MED ORDER — SODIUM CHLORIDE 0.9 % IV SOLN
3.6000 mg/kg | Freq: Once | INTRAVENOUS | Status: AC
  Administered 2022-04-03: 260 mg via INTRAVENOUS
  Filled 2022-04-03: qty 8
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
## 2022-04-03 NOTE — Patient Instructions (Signed)
[facility]  Discharge Instructions: Thank you for choosing [facility] to provide your oncology and hematology care.  If you have a lab appointment with the Cancer Center, please go directly to the Cancer Center and check in at the registration area.  Wear comfortable clothing and clothing appropriate for easy access to any Portacath or PICC line.   We strive to give you quality time with your provider. You may need to reschedule your appointment if you arrive late (15 or more minutes).  Arriving late affects you and other patients whose appointments are after yours.  Also, if you miss three or more appointments without notifying the office, you may be dismissed from the clinic at the provider's discretion.      For prescription refill requests, have your pharmacy contact our office and allow 72 hours for refills to be completed.       To help prevent nausea and vomiting after your treatment, we encourage you to take your nausea medication as directed.  BELOW ARE SYMPTOMS THAT SHOULD BE REPORTED IMMEDIATELY: *FEVER GREATER THAN 100.4 F (38 C) OR HIGHER *CHILLS OR SWEATING *NAUSEA AND VOMITING THAT IS NOT CONTROLLED WITH YOUR NAUSEA MEDICATION *UNUSUAL SHORTNESS OF BREATH *UNUSUAL BRUISING OR BLEEDING *URINARY PROBLEMS (pain or burning when urinating, or frequent urination) *BOWEL PROBLEMS (unusual diarrhea, constipation, pain near the anus) TENDERNESS IN MOUTH AND THROAT WITH OR WITHOUT PRESENCE OF ULCERS (sore throat, sores in mouth, or a toothache) UNUSUAL RASH, SWELLING OR PAIN  UNUSUAL VAGINAL DISCHARGE OR ITCHING   Items with * indicate a potential emergency and should be followed up as soon as possible or go to the Emergency Department if any problems should occur.  Please show the CHEMOTHERAPY ALERT CARD or IMMUNOTHERAPY ALERT CARD at check-in to the Emergency Department and triage nurse.  Should you have questions after your  visit or need to cancel or reschedule your appointment, please contact [facility]  [phone] and follow the prompts.  Office hours are 8:00 a.m. to 4:30 p.m. Monday - Friday. Please note that voicemails left after 4:00 p.m. may not be returned until the following business day.  We are closed weekends and major holidays. You have access to a nurse at all times for urgent questions. Please call the main number to the clinic [phone] and follow the prompts.  For any non-urgent questions, you may also contact your provider using MyChart. We now offer e-Visits for anyone 18 and older to request care online for non-urgent symptoms. For details visit [email or web address].   Also download the MyChart app! Go to the app store, search "MyChart", open the app, select [facility], and log in with your MyChart username and password.  Masks are optional in the cancer centers. If you would like for your care team to wear a mask while they are taking care of you, please let them know. For doctor visits, patients may have with them one support person who is at least 68 years old. At this time, visitors are not allowed in the infusion area.   

## 2022-04-10 ENCOUNTER — Other Ambulatory Visit: Payer: Self-pay | Admitting: Oncology

## 2022-04-10 ENCOUNTER — Telehealth: Payer: Self-pay | Admitting: Oncology

## 2022-04-10 ENCOUNTER — Ambulatory Visit: Payer: Medicare Other

## 2022-04-10 NOTE — Telephone Encounter (Signed)
Patient called and requested that her treatment day be moved to either Wednesdays or Fridays due to her husband's work schedule.   Please advise, her next treatment is on 9/7

## 2022-04-10 NOTE — Telephone Encounter (Signed)
Ok to r/s appt to 9/8 (Friday). If so, IS needs to be updated. Thank you.   [place], please contact pt with new appts once IS is updated.

## 2022-04-10 NOTE — Telephone Encounter (Signed)
Ok to move appts to 9/8 per MD.

## 2022-04-12 ENCOUNTER — Other Ambulatory Visit: Payer: Self-pay

## 2022-04-14 ENCOUNTER — Ambulatory Visit: Payer: Medicare Other

## 2022-04-19 ENCOUNTER — Other Ambulatory Visit: Payer: Self-pay

## 2022-04-20 ENCOUNTER — Other Ambulatory Visit: Payer: Self-pay | Admitting: Oncology

## 2022-04-24 ENCOUNTER — Ambulatory Visit: Payer: Medicare Other

## 2022-04-24 ENCOUNTER — Ambulatory Visit: Payer: Medicare Other | Admitting: Oncology

## 2022-04-24 ENCOUNTER — Other Ambulatory Visit: Payer: Medicare Other

## 2022-04-25 ENCOUNTER — Ambulatory Visit
Admission: RE | Admit: 2022-04-25 | Discharge: 2022-04-25 | Disposition: A | Discharge status: Home / Self Care | Payer: Medicare PPO | Source: Ambulatory Visit | Attending: Oncology | Admitting: Oncology

## 2022-04-25 ENCOUNTER — Other Ambulatory Visit: Payer: Self-pay | Admitting: Oncology

## 2022-04-25 ENCOUNTER — Inpatient Hospital Stay (HOSPITAL_BASED_OUTPATIENT_CLINIC_OR_DEPARTMENT_OTHER): Payer: Medicare PPO | Admitting: Oncology

## 2022-04-25 ENCOUNTER — Inpatient Hospital Stay: Payer: Medicare PPO

## 2022-04-25 ENCOUNTER — Inpatient Hospital Stay: Payer: Medicare PPO | Attending: Oncology

## 2022-04-25 ENCOUNTER — Ambulatory Visit: Payer: Medicare PPO

## 2022-04-25 ENCOUNTER — Encounter: Payer: Self-pay | Admitting: Oncology

## 2022-04-25 VITALS — BP 109/55 | HR 87 | Temp 98.2°F | Resp 18

## 2022-04-25 DIAGNOSIS — Z170 Estrogen receptor positive status [ER+]: Secondary | ICD-10-CM | POA: Diagnosis not present

## 2022-04-25 DIAGNOSIS — J449 Chronic obstructive pulmonary disease, unspecified: Secondary | ICD-10-CM | POA: Diagnosis not present

## 2022-04-25 DIAGNOSIS — C50211 Malignant neoplasm of upper-inner quadrant of right female breast: Secondary | ICD-10-CM | POA: Diagnosis present

## 2022-04-25 DIAGNOSIS — Z79899 Other long term (current) drug therapy: Secondary | ICD-10-CM | POA: Diagnosis not present

## 2022-04-25 DIAGNOSIS — E876 Hypokalemia: Secondary | ICD-10-CM

## 2022-04-25 DIAGNOSIS — E785 Hyperlipidemia, unspecified: Secondary | ICD-10-CM | POA: Diagnosis not present

## 2022-04-25 DIAGNOSIS — C50919 Malignant neoplasm of unspecified site of unspecified female breast: Secondary | ICD-10-CM

## 2022-04-25 DIAGNOSIS — D649 Anemia, unspecified: Secondary | ICD-10-CM

## 2022-04-25 DIAGNOSIS — Z5111 Encounter for antineoplastic chemotherapy: Secondary | ICD-10-CM | POA: Insufficient documentation

## 2022-04-25 DIAGNOSIS — I10 Essential (primary) hypertension: Secondary | ICD-10-CM | POA: Insufficient documentation

## 2022-04-25 DIAGNOSIS — M8580 Other specified disorders of bone density and structure, unspecified site: Secondary | ICD-10-CM | POA: Diagnosis not present

## 2022-04-25 DIAGNOSIS — Z5112 Encounter for antineoplastic immunotherapy: Secondary | ICD-10-CM | POA: Diagnosis present

## 2022-04-25 LAB — COMPREHENSIVE METABOLIC PANEL
ALT: 14 U/L (ref 0–44)
AST: 25 U/L (ref 15–41)
Albumin: 3.3 g/dL — ABNORMAL LOW (ref 3.5–5.0)
Alkaline Phosphatase: 67 U/L (ref 38–126)
Anion gap: 7 (ref 5–15)
BUN: 19 mg/dL (ref 8–23)
CO2: 31 mmol/L (ref 22–32)
Calcium: 9.1 mg/dL (ref 8.9–10.3)
Chloride: 101 mmol/L (ref 98–111)
Creatinine, Ser: 0.92 mg/dL (ref 0.44–1.00)
GFR, Estimated: >60 mL/min (ref >60)
Glucose, Bld: 96 mg/dL (ref 70–99)
Potassium: 4.2 mmol/L (ref 3.5–5.1)
Sodium: 139 mmol/L (ref 135–145)
Total Bilirubin: 0.3 mg/dL (ref 0.3–1.2)
Total Protein: 7.5 g/dL (ref 6.5–8.1)

## 2022-04-25 LAB — CBC WITH DIFFERENTIAL/PLATELET
Abs Immature Granulocytes: 0.05 10*3/uL (ref 0.00–0.07)
Basophils Absolute: 0.1 10*3/uL (ref 0.0–0.1)
Basophils Relative: 1 %
Eosinophils Absolute: 0.2 10*3/uL (ref 0.0–0.5)
Eosinophils Relative: 1 %
HCT: 32.2 % — ABNORMAL LOW (ref 36.0–46.0)
Hemoglobin: 10.2 g/dL — ABNORMAL LOW (ref 12.0–15.0)
Immature Granulocytes: 1 %
Lymphocytes Relative: 20 %
Lymphs Abs: 2.2 10*3/uL (ref 0.7–4.0)
MCH: 29 pg (ref 26.0–34.0)
MCHC: 31.7 g/dL (ref 30.0–36.0)
MCV: 91.5 fL (ref 80.0–100.0)
Monocytes Absolute: 0.8 10*3/uL (ref 0.1–1.0)
Monocytes Relative: 7 %
Neutro Abs: 7.4 10*3/uL (ref 1.7–7.7)
Neutrophils Relative %: 70 %
Platelets: 260 10*3/uL (ref 150–400)
RBC: 3.52 MIL/uL — ABNORMAL LOW (ref 3.87–5.11)
RDW: 16.1 % — ABNORMAL HIGH (ref 11.5–15.5)
WBC: 10.6 10*3/uL — ABNORMAL HIGH (ref 4.0–10.5)
nRBC: 0 % (ref 0.0–0.2)

## 2022-04-25 MED ORDER — SODIUM CHLORIDE 0.9 % IV SOLN
Freq: Once | INTRAVENOUS | Status: AC
  Filled 2022-04-25: qty 250

## 2022-04-25 MED ORDER — PROCHLORPERAZINE MALEATE 10 MG PO TABS
10.0000 mg | ORAL_TABLET | Freq: Once | ORAL | Status: AC
  Administered 2022-04-25: 10 mg via ORAL
  Filled 2022-04-25: qty 1

## 2022-04-25 MED ORDER — HEPARIN SOD (PORK) LOCK FLUSH 100 UNIT/ML IV SOLN
INTRAVENOUS | Status: AC
  Administered 2022-04-25: 500 [IU]
  Filled 2022-04-25: qty 5

## 2022-04-25 MED ORDER — SODIUM CHLORIDE 0.9 % IV SOLN
3.6000 mg/kg | Freq: Once | INTRAVENOUS | Status: AC
  Administered 2022-04-25: 260 mg via INTRAVENOUS
  Filled 2022-04-25: qty 8

## 2022-04-25 MED ORDER — ACETAMINOPHEN 325 MG PO TABS
650.0000 mg | ORAL_TABLET | Freq: Once | ORAL | Status: AC
  Administered 2022-04-25: 650 mg via ORAL
  Filled 2022-04-25: qty 2

## 2022-04-25 MED ORDER — TAMOXIFEN CITRATE 20 MG PO TABS: 20.0000 mg | ORAL_TABLET | Freq: Every day | ORAL | 1 refills | Status: DC

## 2022-04-25 MED ORDER — DIPHENHYDRAMINE HCL 25 MG PO CAPS
50.0000 mg | ORAL_CAPSULE | Freq: Once | ORAL | Status: AC
  Administered 2022-04-25: 50 mg via ORAL
  Filled 2022-04-25: qty 2

## 2022-04-25 NOTE — Assessment & Plan Note (Signed)
Chemotherapy plan as above 

## 2022-04-25 NOTE — Patient Instructions (Signed)
[facility] [place]  Discharge Instructions: Thank you for choosing [facility] to provide your oncology and hematology care.  If you have a lab appointment with the [facility], please go directly to the [facility] and check in at the registration area.  Wear comfortable clothing and clothing appropriate for easy access to any Portacath or PICC line.   We strive to give you quality time with your provider. You may need to reschedule your appointment if you arrive late (15 or more minutes).  Arriving late affects you and other patients whose appointments are after yours.  Also, if you miss three or more appointments without notifying the office, you may be dismissed from the clinic at the provider's discretion.      For prescription refill requests, have your pharmacy contact our office and allow 72 hours for refills to be completed.    Today you received the following chemotherapy and/or immunotherapy agents Kadcyla      To help prevent nausea and vomiting after your treatment, we encourage you to take your nausea medication as directed.  BELOW ARE SYMPTOMS THAT SHOULD BE REPORTED IMMEDIATELY: *FEVER GREATER THAN 100.4 F (38 C) OR HIGHER *CHILLS OR SWEATING *NAUSEA AND VOMITING THAT IS NOT CONTROLLED WITH YOUR NAUSEA MEDICATION *UNUSUAL SHORTNESS OF BREATH *UNUSUAL BRUISING OR BLEEDING *URINARY PROBLEMS (pain or burning when urinating, or frequent urination) *BOWEL PROBLEMS (unusual diarrhea, constipation, pain near the anus) TENDERNESS IN MOUTH AND THROAT WITH OR WITHOUT PRESENCE OF ULCERS (sore throat, sores in mouth, or a toothache) UNUSUAL RASH, SWELLING OR PAIN  UNUSUAL VAGINAL DISCHARGE OR ITCHING   Items with * indicate a potential emergency and should be followed up as soon as possible or go to the Emergency Department if any problems should occur.  Please show the CHEMOTHERAPY ALERT CARD or IMMUNOTHERAPY ALERT CARD at check-in to the  Emergency Department and triage nurse.  Should you have questions after your visit or need to cancel or reschedule your appointment, please contact [facility] [place] [facility] [place]  [phone] and follow the prompts.  Office hours are 8:00 a.m. to 4:30 p.m. Monday - Friday. Please note that voicemails left after 4:00 p.m. may not be returned until the following business day.  We are closed weekends and major holidays. You have access to a nurse at all times for urgent questions. Please call the main number to the clinic [phone] and follow the prompts.  For any non-urgent questions, you may also contact your provider using MyChart. We now offer e-Visits for anyone 7 and older to request care online for non-urgent symptoms. For details visit [email or web address].   Also download the MyChart app! Go to the app store, search "MyChart", open the app, select , and log in with your MyChart username and password.  Masks are optional in the cancer centers. If you would like for your care team to wear a mask while they are taking care of you, please let them know. For doctor visits, patients may have with them one support person who is at least 68 years old. At this time, visitors are not allowed in the infusion area.

## 2022-04-25 NOTE — Assessment & Plan Note (Signed)
Continue potassium supplementation.  20 mEq daily 

## 2022-04-25 NOTE — Assessment & Plan Note (Signed)
Right breast cT1c cN0 invasive carcinoma, ER/PR positive, HER2 positive tumor size appears larger, 2.5cm,  T2  on MRI,  Baseline normal CA 27-29, CEA 15-3 S/p TCH x2, and TCHP x4 followed by lumpectomy and sentinel lymph node biopsy. ypT1a ypN0- Residual disease after neoadjuvant chemotherapy, status post adjuvant radiation Labs reviewed and discussed with patient. Proceed with Kadcyla today.  Continue tamoxifen 20 mg daily.-Patient is on Eliquis for atrial fibrillation. Recommend gynecology evaluation annually-refer to gynecology 

## 2022-04-25 NOTE — Progress Notes (Signed)
*  PRELIMINARY RESULTS* Echocardiogram 2D Echocardiogram has been performed.  Sherrie Sport 04/25/2022, 9:52 AM

## 2022-04-25 NOTE — Assessment & Plan Note (Signed)
Recommend calcium 1200 mg  and vitamin D  daily supplementation.  
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
## 2022-04-25 NOTE — Assessment & Plan Note (Signed)
Due to chronic disease and previous chemotherapy. Hemoglobin stable.  Monitor 

## 2022-04-25 NOTE — Telephone Encounter (Signed)
Component Ref Range & Units 10:05 (04/25/22) 3 wk ago (04/03/22) 1 mo ago (03/13/22) 2 mo ago (02/19/22) 2 mo ago (01/28/22) 3 mo ago (01/07/22) 4 mo ago (12/24/21)  Potassium 3.5 - 5.1 mmol/L 4.2  3.4 Low   3.7  3.8  3.8  3.4 Low   3.8

## 2022-04-26 LAB — ECHOCARDIOGRAM COMPLETE
AR max vel: 3.26 cm2
AV Area VTI: 3.91 cm2
AV Area mean vel: 2.87 cm2
AV Mean grad: 3 mmHg
AV Peak grad: 5.3 mmHg
Ao pk vel: 1.15 m/s
Area-P 1/2: 3.48 cm2
S' Lateral: 3.1 cm

## 2022-05-03 ENCOUNTER — Other Ambulatory Visit: Payer: Self-pay

## 2022-05-14 ENCOUNTER — Ambulatory Visit
Admission: RE | Admit: 2022-05-14 | Discharge: 2022-05-14 | Disposition: A | Discharge status: Home / Self Care | Payer: Medicare PPO | Source: Ambulatory Visit | Attending: Radiation Oncology | Admitting: Radiation Oncology

## 2022-05-14 VITALS — BP 129/81 | HR 87 | Temp 96.4°F | Resp 20 | Wt 171.6 lb

## 2022-05-14 DIAGNOSIS — C50211 Malignant neoplasm of upper-inner quadrant of right female breast: Secondary | ICD-10-CM | POA: Insufficient documentation

## 2022-05-14 DIAGNOSIS — Z170 Estrogen receptor positive status [ER+]: Secondary | ICD-10-CM | POA: Diagnosis not present

## 2022-05-14 DIAGNOSIS — Z923 Personal history of irradiation: Secondary | ICD-10-CM | POA: Insufficient documentation

## 2022-05-14 DIAGNOSIS — C50919 Malignant neoplasm of unspecified site of unspecified female breast: Secondary | ICD-10-CM

## 2022-05-14 DIAGNOSIS — Z79810 Long term (current) use of selective estrogen receptor modulators (SERMs): Secondary | ICD-10-CM | POA: Insufficient documentation

## 2022-05-14 NOTE — Progress Notes (Signed)
Radiation Oncology Follow up Note  Name: Debra [name]   Date:   05/14/2022 MRN:  [id number] DOB: 1956/05/28    This 66 y.o. female presents to the clinic today for 22-monthfollow-up status post whole breast radiation to her right breast for stage Ib (cT2 N0 M0) triple positive invasive mammary carcinoma status post neoadjuvant chemotherapy then wide local excision..Marland Kitchen REFERRING PROVIDER: HTracie Harrier MD  HPI: Patient is a 66year old female now out 6 months having completed whole breast radiation to her right breast for stage Ib triple positive invasive mammary carcinoma.  Seen today in routine follow-up she is doing well.  She specifically denies breast tenderness cough or bone pain.  She is on nasal oxygen..  She is currently [place] she is tolerating well.  She had mammograms back in June which I have reviewed were BI-RADS 2 benign.  She is currently on tamoxifen tolerating it well without side effect.  COMPLICATIONS OF TREATMENT: none  FOLLOW UP COMPLIANCE: keeps appointments   PHYSICAL EXAM:  BP 129/81   Pulse 87   Temp (!) 96.4 F (35.8 C)   Resp 20   Wt 171 lb 9.6 oz (77.8 kg)   BMI 29.46 kg/m  Lungs are clear to A&P cardiac examination essentially unremarkable with regular rate and rhythm. No dominant mass or nodularity is noted in either breast in 2 positions examined. Incision is well-healed. No axillary or supraclavicular adenopathy is appreciated. Cosmetic result is excellent.  She still has some hyperpigmentation of the skin which is slowly resolving in the area of the right breast.  Well-developed well-nourished patient in NAD. HEENT reveals PERLA, EOMI, discs not visualized.  Oral cavity is clear. No oral mucosal lesions are identified. Neck is clear without evidence of cervical or supraclavicular adenopathy. Lungs are clear to A&P. Cardiac examination is essentially unremarkable with regular rate and rhythm without murmur rub or thrill. Abdomen is benign with no  organomegaly or masses noted. Motor sensory and DTR levels are equal and symmetric in the upper and lower extremities. Cranial nerves II through XII are grossly intact. Proprioception is intact. No peripheral adenopathy or edema is identified. No motor or sensory levels are noted. Crude visual fields are within normal range.  RADIOLOGY RESULTS: Mammograms reviewed compatible with above-stated findings  PLAN: The present time patient is doing well with no evidence of disease now out 6 months and pleased with her overall progress.  I have asked to see her back in 6 months for follow-up and then we will go to once year follow-up visits.  She continues on tamoxifen without side effect.  She continues under treatment with medical oncology.  Patient knows to call with any concerns.  I would like to take this opportunity to thank you for allowing me to participate in the care of your patient..Noreene Filbert MD

## 2022-05-15 ENCOUNTER — Other Ambulatory Visit: Payer: Self-pay

## 2022-05-15 ENCOUNTER — Ambulatory Visit: Payer: Medicare PPO | Admitting: Oncology

## 2022-05-15 ENCOUNTER — Other Ambulatory Visit: Payer: Medicare PPO

## 2022-05-15 ENCOUNTER — Ambulatory Visit: Payer: Medicare PPO

## 2022-05-16 ENCOUNTER — Encounter: Payer: Self-pay | Admitting: Oncology

## 2022-05-16 ENCOUNTER — Other Ambulatory Visit: Payer: Self-pay | Admitting: Oncology

## 2022-05-16 ENCOUNTER — Inpatient Hospital Stay: Payer: Medicare PPO

## 2022-05-16 ENCOUNTER — Inpatient Hospital Stay (HOSPITAL_BASED_OUTPATIENT_CLINIC_OR_DEPARTMENT_OTHER): Payer: Medicare PPO | Admitting: Oncology

## 2022-05-16 VITALS — BP 121/62 | HR 85 | Temp 98.4°F | Resp 18 | Wt 172.6 lb

## 2022-05-16 DIAGNOSIS — C50919 Malignant neoplasm of unspecified site of unspecified female breast: Secondary | ICD-10-CM

## 2022-05-16 DIAGNOSIS — C50911 Malignant neoplasm of unspecified site of right female breast: Secondary | ICD-10-CM

## 2022-05-16 DIAGNOSIS — M542 Cervicalgia: Secondary | ICD-10-CM

## 2022-05-16 DIAGNOSIS — M8580 Other specified disorders of bone density and structure, unspecified site: Secondary | ICD-10-CM

## 2022-05-16 DIAGNOSIS — D649 Anemia, unspecified: Secondary | ICD-10-CM | POA: Diagnosis not present

## 2022-05-16 DIAGNOSIS — D6481 Anemia due to antineoplastic chemotherapy: Secondary | ICD-10-CM | POA: Diagnosis not present

## 2022-05-16 DIAGNOSIS — Z5111 Encounter for antineoplastic chemotherapy: Secondary | ICD-10-CM | POA: Diagnosis not present

## 2022-05-16 DIAGNOSIS — Z5112 Encounter for antineoplastic immunotherapy: Secondary | ICD-10-CM | POA: Diagnosis not present

## 2022-05-16 DIAGNOSIS — E876 Hypokalemia: Secondary | ICD-10-CM

## 2022-05-16 LAB — COMPREHENSIVE METABOLIC PANEL
ALT: 12 U/L (ref 0–44)
AST: 25 U/L (ref 15–41)
Albumin: 3.1 g/dL — ABNORMAL LOW (ref 3.5–5.0)
Alkaline Phosphatase: 62 U/L (ref 38–126)
Anion gap: 4 — ABNORMAL LOW (ref 5–15)
BUN: 18 mg/dL (ref 8–23)
CO2: 32 mmol/L (ref 22–32)
Calcium: 8.4 mg/dL — ABNORMAL LOW (ref 8.9–10.3)
Chloride: 101 mmol/L (ref 98–111)
Creatinine, Ser: 0.98 mg/dL (ref 0.44–1.00)
GFR, Estimated: >60 mL/min (ref >60)
Glucose, Bld: 119 mg/dL — ABNORMAL HIGH (ref 70–99)
Potassium: 3.7 mmol/L (ref 3.5–5.1)
Sodium: 137 mmol/L (ref 135–145)
Total Bilirubin: 0.3 mg/dL (ref 0.3–1.2)
Total Protein: 7.2 g/dL (ref 6.5–8.1)

## 2022-05-16 LAB — CBC WITH DIFFERENTIAL/PLATELET
Abs Immature Granulocytes: 0.03 10*3/uL (ref 0.00–0.07)
Basophils Absolute: 0.1 10*3/uL (ref 0.0–0.1)
Basophils Relative: 1 %
Eosinophils Absolute: 0.2 10*3/uL (ref 0.0–0.5)
Eosinophils Relative: 2 %
HCT: 30.9 % — ABNORMAL LOW (ref 36.0–46.0)
Hemoglobin: 9.7 g/dL — ABNORMAL LOW (ref 12.0–15.0)
Immature Granulocytes: 0 %
Lymphocytes Relative: 36 %
Lymphs Abs: 3 10*3/uL (ref 0.7–4.0)
MCH: 29 pg (ref 26.0–34.0)
MCHC: 31.4 g/dL (ref 30.0–36.0)
MCV: 92.2 fL (ref 80.0–100.0)
Monocytes Absolute: 0.6 10*3/uL (ref 0.1–1.0)
Monocytes Relative: 7 %
Neutro Abs: 4.4 10*3/uL (ref 1.7–7.7)
Neutrophils Relative %: 54 %
Platelets: 219 10*3/uL (ref 150–400)
RBC: 3.35 MIL/uL — ABNORMAL LOW (ref 3.87–5.11)
RDW: 15.5 % (ref 11.5–15.5)
WBC: 8.4 10*3/uL (ref 4.0–10.5)
nRBC: 0 % (ref 0.0–0.2)

## 2022-05-16 MED ORDER — ACETAMINOPHEN 325 MG PO TABS
650.0000 mg | ORAL_TABLET | Freq: Once | ORAL | Status: AC
  Administered 2022-05-16: 650 mg via ORAL
  Filled 2022-05-16: qty 2

## 2022-05-16 MED ORDER — SODIUM CHLORIDE 0.9 % IV SOLN
Freq: Once | INTRAVENOUS | Status: AC
  Filled 2022-05-16: qty 250

## 2022-05-16 MED ORDER — SODIUM CHLORIDE 0.9% FLUSH
10.0000 mL | Freq: Once | INTRAVENOUS | Status: AC
  Administered 2022-05-16: 10 mL via INTRAVENOUS
  Filled 2022-05-16: qty 10

## 2022-05-16 MED ORDER — HEPARIN SOD (PORK) LOCK FLUSH 100 UNIT/ML IV SOLN
500.0000 [IU] | Freq: Once | INTRAVENOUS | Status: AC
  Administered 2022-05-16: 500 [IU] via INTRAVENOUS
  Filled 2022-05-16: qty 5

## 2022-05-16 MED ORDER — SODIUM CHLORIDE 0.9 % IV SOLN
3.6000 mg/kg | Freq: Once | INTRAVENOUS | Status: AC
  Administered 2022-05-16: 260 mg via INTRAVENOUS
  Filled 2022-05-16: qty 8

## 2022-05-16 MED ORDER — PROCHLORPERAZINE MALEATE 10 MG PO TABS
10.0000 mg | ORAL_TABLET | Freq: Once | ORAL | Status: AC
  Administered 2022-05-16: 10 mg via ORAL
  Filled 2022-05-16: qty 1

## 2022-05-16 MED ORDER — DIPHENHYDRAMINE HCL 25 MG PO CAPS
50.0000 mg | ORAL_CAPSULE | Freq: Once | ORAL | Status: AC
  Administered 2022-05-16: 50 mg via ORAL
  Filled 2022-05-16: qty 2

## 2022-05-16 NOTE — Assessment & Plan Note (Signed)
Recommend calcium 1200 mg  and vitamin D  daily supplementation.  
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
## 2022-05-16 NOTE — Progress Notes (Signed)
Pt here for follow up. Pt reports soreness/ pain to left side neck

## 2022-05-16 NOTE — Patient Instructions (Signed)
[facility] AT [place]  Discharge Instructions: Thank you for choosing [facility] to provide your oncology and hematology care.  If you have a lab appointment with the [facility], please go directly to the [facility] and check in at the registration area.  Wear comfortable clothing and clothing appropriate for easy access to any Portacath or PICC line.   We strive to give you quality time with your provider. You may need to reschedule your appointment if you arrive late (15 or more minutes).  Arriving late affects you and other patients whose appointments are after yours.  Also, if you miss three or more appointments without notifying the office, you may be dismissed from the clinic at the provider's discretion.      For prescription refill requests, have your pharmacy contact our office and allow 72 hours for refills to be completed.    Today you received the following chemotherapy and/or immunotherapy agents Ado-Trastuzumab.      To help prevent nausea and vomiting after your treatment, we encourage you to take your nausea medication as directed.  BELOW ARE SYMPTOMS THAT SHOULD BE REPORTED IMMEDIATELY: *FEVER GREATER THAN 100.4 F (38 C) OR HIGHER *CHILLS OR SWEATING *NAUSEA AND VOMITING THAT IS NOT CONTROLLED WITH YOUR NAUSEA MEDICATION *UNUSUAL SHORTNESS OF BREATH *UNUSUAL BRUISING OR BLEEDING *URINARY PROBLEMS (pain or burning when urinating, or frequent urination) *BOWEL PROBLEMS (unusual diarrhea, constipation, pain near the anus) TENDERNESS IN MOUTH AND THROAT WITH OR WITHOUT PRESENCE OF ULCERS (sore throat, sores in mouth, or a toothache) UNUSUAL RASH, SWELLING OR PAIN  UNUSUAL VAGINAL DISCHARGE OR ITCHING   Items with * indicate a potential emergency and should be followed up as soon as possible or go to the Emergency Department if any problems should occur.  Please show the CHEMOTHERAPY ALERT CARD or IMMUNOTHERAPY ALERT CARD at  check-in to the Emergency Department and triage nurse.  Should you have questions after your visit or need to cancel or reschedule your appointment, please contact [facility] [place] AT [place]  [phone] and follow the prompts.  Office hours are 8:00 a.m. to 4:30 p.m. Monday - Friday. Please note that voicemails left after 4:00 p.m. may not be returned until the following business day.  We are closed weekends and major holidays. You have access to a nurse at all times for urgent questions. Please call the main number to the clinic [phone] and follow the prompts.  For any non-urgent questions, you may also contact your provider using MyChart. We now offer e-Visits for anyone 72 and older to request care online for non-urgent symptoms. For details visit [email or web address].   Also download the MyChart app! Go to the app store, search "MyChart", open the app, select [facility], and log in with your MyChart username and password.  Masks are optional in the cancer centers. If you would like for your care team to wear a mask while they are taking care of you, please let them know. For doctor visits, patients may have with them one support person who is at least 68 years old. At this time, visitors are not allowed in the infusion area.

## 2022-05-16 NOTE — Assessment & Plan Note (Signed)
Chemotherapy plan as above 

## 2022-05-16 NOTE — Assessment & Plan Note (Signed)
Right breast cT1c cN0 invasive carcinoma, ER/PR positive, HER2 positive tumor size appears larger, 2.5cm,  T2  on MRI,  Baseline normal CA 27-29, CEA 15-3 S/p TCH x2, and TCHP x4 followed by lumpectomy and sentinel lymph node biopsy. ypT1a ypN0- Residual disease after neoadjuvant chemotherapy, status post adjuvant radiation Labs reviewed and discussed with patient. Proceed with Kadcyla today.  Continue tamoxifen 20 mg daily.-Patient is on Eliquis for atrial fibrillation. Recommend gynecology evaluation annually-refer to gynecology 

## 2022-05-16 NOTE — Assessment & Plan Note (Signed)
Continue potassium supplementation.  20 mEq daily 

## 2022-05-16 NOTE — Assessment & Plan Note (Signed)
Due to chronic disease and previous chemotherapy. Hemoglobin stable.  Monitor 

## 2022-05-16 NOTE — Assessment & Plan Note (Signed)
check ultrasound left upper extremity for evaluation of left IJ

## 2022-05-21 ENCOUNTER — Ambulatory Visit
Admission: RE | Admit: 2022-05-21 | Discharge: 2022-05-21 | Disposition: A | Discharge status: Home / Self Care | Payer: Medicare PPO | Source: Ambulatory Visit | Attending: Oncology | Admitting: Oncology

## 2022-05-21 DIAGNOSIS — M542 Cervicalgia: Secondary | ICD-10-CM | POA: Insufficient documentation

## 2022-06-02 ENCOUNTER — Other Ambulatory Visit: Payer: Self-pay | Admitting: Oncology

## 2022-06-02 ENCOUNTER — Telehealth: Payer: Self-pay

## 2022-06-02 ENCOUNTER — Telehealth: Payer: Self-pay | Admitting: Oncology

## 2022-06-02 ENCOUNTER — Other Ambulatory Visit: Payer: Self-pay

## 2022-06-02 DIAGNOSIS — C50919 Malignant neoplasm of unspecified site of unspecified female breast: Secondary | ICD-10-CM

## 2022-06-02 MED ORDER — APIXABAN 5 MG PO TABS: 5.0000 mg | ORAL_TABLET | Freq: Two times a day (BID) | ORAL | 0 refills | Status: DC

## 2022-06-02 NOTE — Telephone Encounter (Signed)
Pt called and asked when her next tx would be. I don't see any appt requests, can you assist?

## 2022-06-02 NOTE — Telephone Encounter (Signed)
Please schedule pt for lab/MD/ [facility] on Friday10/20. Ok from pharmacy standpoint. Please inform pt of appt details.

## 2022-06-02 NOTE — Telephone Encounter (Signed)
Per LOS on 9/29: 3 weeks lab MD [facility].

## 2022-06-02 NOTE — Telephone Encounter (Signed)
Prescription refill request for Eliquis received. Indication:Afib Last office visit:needs appt Scr:0.9 Age: 66 Weight:78.3 kg  Prescription refilled

## 2022-06-03 ENCOUNTER — Encounter: Payer: Self-pay | Admitting: Oncology

## 2022-06-03 NOTE — Telephone Encounter (Signed)
done

## 2022-06-06 ENCOUNTER — Inpatient Hospital Stay (HOSPITAL_BASED_OUTPATIENT_CLINIC_OR_DEPARTMENT_OTHER): Payer: Medicare PPO | Admitting: Oncology

## 2022-06-06 ENCOUNTER — Inpatient Hospital Stay: Payer: Medicare PPO

## 2022-06-06 ENCOUNTER — Inpatient Hospital Stay: Payer: Medicare PPO | Attending: Oncology

## 2022-06-06 ENCOUNTER — Encounter: Payer: Self-pay | Admitting: Oncology

## 2022-06-06 DIAGNOSIS — Z5111 Encounter for antineoplastic chemotherapy: Secondary | ICD-10-CM

## 2022-06-06 DIAGNOSIS — C50919 Malignant neoplasm of unspecified site of unspecified female breast: Secondary | ICD-10-CM

## 2022-06-06 DIAGNOSIS — E876 Hypokalemia: Secondary | ICD-10-CM

## 2022-06-06 DIAGNOSIS — C50211 Malignant neoplasm of upper-inner quadrant of right female breast: Secondary | ICD-10-CM | POA: Insufficient documentation

## 2022-06-06 DIAGNOSIS — Z79899 Other long term (current) drug therapy: Secondary | ICD-10-CM | POA: Diagnosis not present

## 2022-06-06 DIAGNOSIS — Z95828 Presence of other vascular implants and grafts: Secondary | ICD-10-CM

## 2022-06-06 DIAGNOSIS — D6481 Anemia due to antineoplastic chemotherapy: Secondary | ICD-10-CM | POA: Diagnosis not present

## 2022-06-06 DIAGNOSIS — M8580 Other specified disorders of bone density and structure, unspecified site: Secondary | ICD-10-CM

## 2022-06-06 DIAGNOSIS — M542 Cervicalgia: Secondary | ICD-10-CM

## 2022-06-06 DIAGNOSIS — Z5112 Encounter for antineoplastic immunotherapy: Secondary | ICD-10-CM | POA: Diagnosis present

## 2022-06-06 DIAGNOSIS — D649 Anemia, unspecified: Secondary | ICD-10-CM

## 2022-06-06 DIAGNOSIS — C50911 Malignant neoplasm of unspecified site of right female breast: Secondary | ICD-10-CM | POA: Diagnosis not present

## 2022-06-06 LAB — CBC WITH DIFFERENTIAL/PLATELET
Abs Immature Granulocytes: 0.03 10*3/uL (ref 0.00–0.07)
Basophils Absolute: 0 10*3/uL (ref 0.0–0.1)
Basophils Relative: 1 %
Eosinophils Absolute: 0.2 10*3/uL (ref 0.0–0.5)
Eosinophils Relative: 3 %
HCT: 32.7 % — ABNORMAL LOW (ref 36.0–46.0)
Hemoglobin: 10.2 g/dL — ABNORMAL LOW (ref 12.0–15.0)
Immature Granulocytes: 0 %
Lymphocytes Relative: 32 %
Lymphs Abs: 2.8 10*3/uL (ref 0.7–4.0)
MCH: 28.9 pg (ref 26.0–34.0)
MCHC: 31.2 g/dL (ref 30.0–36.0)
MCV: 92.6 fL (ref 80.0–100.0)
Monocytes Absolute: 0.7 10*3/uL (ref 0.1–1.0)
Monocytes Relative: 8 %
Neutro Abs: 4.9 10*3/uL (ref 1.7–7.7)
Neutrophils Relative %: 56 %
Platelets: 231 10*3/uL (ref 150–400)
RBC: 3.53 MIL/uL — ABNORMAL LOW (ref 3.87–5.11)
RDW: 14.8 % (ref 11.5–15.5)
WBC: 8.7 10*3/uL (ref 4.0–10.5)
nRBC: 0 % (ref 0.0–0.2)

## 2022-06-06 LAB — COMPREHENSIVE METABOLIC PANEL
ALT: 13 U/L (ref 0–44)
AST: 26 U/L (ref 15–41)
Albumin: 3.5 g/dL (ref 3.5–5.0)
Alkaline Phosphatase: 60 U/L (ref 38–126)
Anion gap: 7 (ref 5–15)
BUN: 16 mg/dL (ref 8–23)
CO2: 32 mmol/L (ref 22–32)
Calcium: 9.1 mg/dL (ref 8.9–10.3)
Chloride: 98 mmol/L (ref 98–111)
Creatinine, Ser: 0.93 mg/dL (ref 0.44–1.00)
GFR, Estimated: >60 mL/min (ref >60)
Glucose, Bld: 83 mg/dL (ref 70–99)
Potassium: 3.8 mmol/L (ref 3.5–5.1)
Sodium: 137 mmol/L (ref 135–145)
Total Bilirubin: 0.3 mg/dL (ref 0.3–1.2)
Total Protein: 7.7 g/dL (ref 6.5–8.1)

## 2022-06-06 MED ORDER — DIPHENHYDRAMINE HCL 25 MG PO CAPS
50.0000 mg | ORAL_CAPSULE | Freq: Once | ORAL | Status: AC
  Administered 2022-06-06: 50 mg via ORAL
  Filled 2022-06-06: qty 2

## 2022-06-06 MED ORDER — SODIUM CHLORIDE 0.9% FLUSH
10.0000 mL | Freq: Once | INTRAVENOUS | Status: AC
  Administered 2022-06-06: 10 mL via INTRAVENOUS
  Filled 2022-06-06: qty 10

## 2022-06-06 MED ORDER — SODIUM CHLORIDE 0.9 % IV SOLN
3.6000 mg/kg | Freq: Once | INTRAVENOUS | Status: AC
  Administered 2022-06-06: 260 mg via INTRAVENOUS
  Filled 2022-06-06: qty 5

## 2022-06-06 MED ORDER — ACETAMINOPHEN 325 MG PO TABS
650.0000 mg | ORAL_TABLET | Freq: Once | ORAL | Status: AC
  Administered 2022-06-06: 650 mg via ORAL
  Filled 2022-06-06: qty 2

## 2022-06-06 MED ORDER — HEPARIN SOD (PORK) LOCK FLUSH 100 UNIT/ML IV SOLN
500.0000 [IU] | Freq: Once | INTRAVENOUS | Status: AC | PRN
  Administered 2022-06-06: 500 [IU]
  Filled 2022-06-06: qty 5

## 2022-06-06 MED ORDER — PROCHLORPERAZINE MALEATE 10 MG PO TABS
10.0000 mg | ORAL_TABLET | Freq: Once | ORAL | Status: AC
  Administered 2022-06-06: 10 mg via ORAL
  Filled 2022-06-06: qty 1

## 2022-06-06 MED ORDER — POTASSIUM CHLORIDE CRYS ER 20 MEQ PO TBCR: 20.0000 meq | EXTENDED_RELEASE_TABLET | Freq: Every day | ORAL | 1 refills | Status: DC

## 2022-06-06 MED ORDER — SODIUM CHLORIDE 0.9 % IV SOLN
Freq: Once | INTRAVENOUS | Status: AC
  Filled 2022-06-06: qty 250

## 2022-06-06 NOTE — Assessment & Plan Note (Signed)
Due to chronic disease and previous chemotherapy. Hemoglobin stable.  Monitor 
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
## 2022-06-06 NOTE — Assessment & Plan Note (Signed)
Recommend calcium 1200 mg  and vitamin D  daily supplementation.  

## 2022-06-06 NOTE — Assessment & Plan Note (Signed)
Chronic discomfort of left lower neck. Possible due to medi port.  No DVT on [place].  Discussed about option of dc medi port after her last Kadcyla treatment.

## 2022-06-06 NOTE — Assessment & Plan Note (Signed)
Right breast cT1c cN0 invasive carcinoma, ER/PR positive, HER2 positive tumor size appears larger, 2.5cm,  T2  on MRI,  Baseline normal CA 27-29, CEA 15-3 S/p TCH x2, and TCHP x4 followed by lumpectomy and sentinel lymph node biopsy. ypT1a ypN0- Residual disease after neoadjuvant chemotherapy, status post adjuvant radiation Labs reviewed and discussed with patient. Proceed with Kadcyla today.  Continue tamoxifen 20 mg daily.-Patient is on Eliquis for atrial fibrillation. Recommend gynecology evaluation annually-refer to gynecology 

## 2022-06-06 NOTE — Patient Instructions (Signed)
[facility]  Discharge Instructions: Thank you for choosing [facility] to provide your oncology and hematology care.  If you have a lab appointment with the Cancer Center, please go directly to the Cancer Center and check in at the registration area.  Wear comfortable clothing and clothing appropriate for easy access to any Portacath or PICC line.   We strive to give you quality time with your provider. You may need to reschedule your appointment if you arrive late (15 or more minutes).  Arriving late affects you and other patients whose appointments are after yours.  Also, if you miss three or more appointments without notifying the office, you may be dismissed from the clinic at the provider's discretion.      For prescription refill requests, have your pharmacy contact our office and allow 72 hours for refills to be completed.       To help prevent nausea and vomiting after your treatment, we encourage you to take your nausea medication as directed.  BELOW ARE SYMPTOMS THAT SHOULD BE REPORTED IMMEDIATELY: *FEVER GREATER THAN 100.4 F (38 C) OR HIGHER *CHILLS OR SWEATING *NAUSEA AND VOMITING THAT IS NOT CONTROLLED WITH YOUR NAUSEA MEDICATION *UNUSUAL SHORTNESS OF BREATH *UNUSUAL BRUISING OR BLEEDING *URINARY PROBLEMS (pain or burning when urinating, or frequent urination) *BOWEL PROBLEMS (unusual diarrhea, constipation, pain near the anus) TENDERNESS IN MOUTH AND THROAT WITH OR WITHOUT PRESENCE OF ULCERS (sore throat, sores in mouth, or a toothache) UNUSUAL RASH, SWELLING OR PAIN  UNUSUAL VAGINAL DISCHARGE OR ITCHING   Items with * indicate a potential emergency and should be followed up as soon as possible or go to the Emergency Department if any problems should occur.  Please show the CHEMOTHERAPY ALERT CARD or IMMUNOTHERAPY ALERT CARD at check-in to the Emergency Department and triage nurse.  Should you have questions after your  visit or need to cancel or reschedule your appointment, please contact [facility]  [phone] and follow the prompts.  Office hours are 8:00 a.m. to 4:30 p.m. Monday - Friday. Please note that voicemails left after 4:00 p.m. may not be returned until the following business day.  We are closed weekends and major holidays. You have access to a nurse at all times for urgent questions. Please call the main number to the clinic [phone] and follow the prompts.  For any non-urgent questions, you may also contact your provider using MyChart. We now offer e-Visits for anyone 18 and older to request care online for non-urgent symptoms. For details visit [email or web address].   Also download the MyChart app! Go to the app store, search "MyChart", open the app, select , and log in with your MyChart username and password.  Masks are optional in the cancer centers. If you would like for your care team to wear a mask while they are taking care of you, please let them know. For doctor visits, patients may have with them one support person who is at least 68 years old. At this time, visitors are not allowed in the infusion area.   

## 2022-06-06 NOTE — Assessment & Plan Note (Signed)
Chemotherapy plan as above 

## 2022-06-06 NOTE — Assessment & Plan Note (Signed)
Continue potassium supplementation.  20 mEq daily 

## 2022-06-19 ENCOUNTER — Other Ambulatory Visit: Payer: Self-pay | Admitting: Oncology

## 2022-06-27 ENCOUNTER — Inpatient Hospital Stay: Payer: Medicare PPO

## 2022-06-27 ENCOUNTER — Inpatient Hospital Stay: Payer: Medicare PPO | Attending: Oncology

## 2022-06-27 ENCOUNTER — Inpatient Hospital Stay: Payer: Medicare PPO | Admitting: Oncology

## 2022-06-27 ENCOUNTER — Encounter: Payer: Self-pay | Admitting: Oncology

## 2022-06-27 VITALS — BP 129/65 | HR 91 | Temp 97.6°F | Wt 173.8 lb

## 2022-06-27 DIAGNOSIS — Z5112 Encounter for antineoplastic immunotherapy: Secondary | ICD-10-CM | POA: Insufficient documentation

## 2022-06-27 DIAGNOSIS — C50919 Malignant neoplasm of unspecified site of unspecified female breast: Secondary | ICD-10-CM

## 2022-06-27 DIAGNOSIS — Z79899 Other long term (current) drug therapy: Secondary | ICD-10-CM | POA: Insufficient documentation

## 2022-06-27 DIAGNOSIS — D649 Anemia, unspecified: Secondary | ICD-10-CM

## 2022-06-27 DIAGNOSIS — M8580 Other specified disorders of bone density and structure, unspecified site: Secondary | ICD-10-CM | POA: Diagnosis not present

## 2022-06-27 DIAGNOSIS — D6481 Anemia due to antineoplastic chemotherapy: Secondary | ICD-10-CM | POA: Diagnosis not present

## 2022-06-27 DIAGNOSIS — C50211 Malignant neoplasm of upper-inner quadrant of right female breast: Secondary | ICD-10-CM | POA: Insufficient documentation

## 2022-06-27 DIAGNOSIS — C50911 Malignant neoplasm of unspecified site of right female breast: Secondary | ICD-10-CM | POA: Diagnosis not present

## 2022-06-27 DIAGNOSIS — Z23 Encounter for immunization: Secondary | ICD-10-CM | POA: Insufficient documentation

## 2022-06-27 DIAGNOSIS — Z5111 Encounter for antineoplastic chemotherapy: Secondary | ICD-10-CM | POA: Diagnosis not present

## 2022-06-27 DIAGNOSIS — E876 Hypokalemia: Secondary | ICD-10-CM | POA: Diagnosis not present

## 2022-06-27 LAB — COMPREHENSIVE METABOLIC PANEL
ALT: 13 U/L (ref 0–44)
AST: 27 U/L (ref 15–41)
Albumin: 3.3 g/dL — ABNORMAL LOW (ref 3.5–5.0)
Alkaline Phosphatase: 58 U/L (ref 38–126)
Anion gap: 7 (ref 5–15)
BUN: 15 mg/dL (ref 8–23)
CO2: 29 mmol/L (ref 22–32)
Calcium: 9.1 mg/dL (ref 8.9–10.3)
Chloride: 103 mmol/L (ref 98–111)
Creatinine, Ser: 0.85 mg/dL (ref 0.44–1.00)
GFR, Estimated: >60 mL/min (ref >60)
Glucose, Bld: 116 mg/dL — ABNORMAL HIGH (ref 70–99)
Potassium: 3.7 mmol/L (ref 3.5–5.1)
Sodium: 139 mmol/L (ref 135–145)
Total Bilirubin: 0.4 mg/dL (ref 0.3–1.2)
Total Protein: 7.7 g/dL (ref 6.5–8.1)

## 2022-06-27 LAB — CBC WITH DIFFERENTIAL/PLATELET
Abs Immature Granulocytes: 0.02 10*3/uL (ref 0.00–0.07)
Basophils Absolute: 0 10*3/uL (ref 0.0–0.1)
Basophils Relative: 0 %
Eosinophils Absolute: 0.2 10*3/uL (ref 0.0–0.5)
Eosinophils Relative: 2 %
HCT: 32.2 % — ABNORMAL LOW (ref 36.0–46.0)
Hemoglobin: 10.2 g/dL — ABNORMAL LOW (ref 12.0–15.0)
Immature Granulocytes: 0 %
Lymphocytes Relative: 27 %
Lymphs Abs: 1.9 10*3/uL (ref 0.7–4.0)
MCH: 29.4 pg (ref 26.0–34.0)
MCHC: 31.7 g/dL (ref 30.0–36.0)
MCV: 92.8 fL (ref 80.0–100.0)
Monocytes Absolute: 0.5 10*3/uL (ref 0.1–1.0)
Monocytes Relative: 7 %
Neutro Abs: 4.4 10*3/uL (ref 1.7–7.7)
Neutrophils Relative %: 64 %
Platelets: 201 10*3/uL (ref 150–400)
RBC: 3.47 MIL/uL — ABNORMAL LOW (ref 3.87–5.11)
RDW: 14.9 % (ref 11.5–15.5)
WBC: 7.1 10*3/uL (ref 4.0–10.5)
nRBC: 0 % (ref 0.0–0.2)

## 2022-06-27 MED ORDER — ACETAMINOPHEN 325 MG PO TABS
650.0000 mg | ORAL_TABLET | Freq: Once | ORAL | Status: AC
  Administered 2022-06-27: 650 mg via ORAL
  Filled 2022-06-27: qty 2

## 2022-06-27 MED ORDER — PROCHLORPERAZINE MALEATE 10 MG PO TABS
10.0000 mg | ORAL_TABLET | Freq: Once | ORAL | Status: AC
  Administered 2022-06-27: 10 mg via ORAL
  Filled 2022-06-27: qty 1

## 2022-06-27 MED ORDER — INFLUENZA VAC A&B SA ADJ QUAD 0.5 ML IM PRSY
0.5000 mL | PREFILLED_SYRINGE | Freq: Once | INTRAMUSCULAR | Status: AC
  Administered 2022-06-27: 0.5 mL via INTRAMUSCULAR
  Filled 2022-06-27: qty 0.5

## 2022-06-27 MED ORDER — HEPARIN SOD (PORK) LOCK FLUSH 100 UNIT/ML IV SOLN
500.0000 [IU] | Freq: Once | INTRAVENOUS | Status: AC | PRN
  Administered 2022-06-27: 500 [IU]
  Filled 2022-06-27: qty 5

## 2022-06-27 MED ORDER — SODIUM CHLORIDE 0.9 % IV SOLN
3.6000 mg/kg | Freq: Once | INTRAVENOUS | Status: AC
  Administered 2022-06-27: 260 mg via INTRAVENOUS
  Filled 2022-06-27: qty 5

## 2022-06-27 MED ORDER — SODIUM CHLORIDE 0.9 % IV SOLN
Freq: Once | INTRAVENOUS | Status: AC
  Filled 2022-06-27: qty 250

## 2022-06-27 MED ORDER — DIPHENHYDRAMINE HCL 25 MG PO CAPS
50.0000 mg | ORAL_CAPSULE | Freq: Once | ORAL | Status: AC
  Administered 2022-06-27: 50 mg via ORAL
  Filled 2022-06-27: qty 2

## 2022-06-27 NOTE — Assessment & Plan Note (Signed)
Chemotherapy plan as above 
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
## 2022-06-27 NOTE — Patient Instructions (Signed)
[facility] AT [place]  Discharge Instructions: Thank you for choosing [facility] to provide your oncology and hematology care.  If you have a lab appointment with the [facility], please go directly to the [facility] and check in at the registration area.  Wear comfortable clothing and clothing appropriate for easy access to any Portacath or PICC line.   We strive to give you quality time with your provider. You may need to reschedule your appointment if you arrive late (15 or more minutes).  Arriving late affects you and other patients whose appointments are after yours.  Also, if you miss three or more appointments without notifying the office, you may be dismissed from the clinic at the provider's discretion.      For prescription refill requests, have your pharmacy contact our office and allow 72 hours for refills to be completed.    Today you received the following chemotherapy and/or immunotherapy agents KADCYLA      To help prevent nausea and vomiting after your treatment, we encourage you to take your nausea medication as directed.  BELOW ARE SYMPTOMS THAT SHOULD BE REPORTED IMMEDIATELY: *FEVER GREATER THAN 100.4 F (38 C) OR HIGHER *CHILLS OR SWEATING *NAUSEA AND VOMITING THAT IS NOT CONTROLLED WITH YOUR NAUSEA MEDICATION *UNUSUAL SHORTNESS OF BREATH *UNUSUAL BRUISING OR BLEEDING *URINARY PROBLEMS (pain or burning when urinating, or frequent urination) *BOWEL PROBLEMS (unusual diarrhea, constipation, pain near the anus) TENDERNESS IN MOUTH AND THROAT WITH OR WITHOUT PRESENCE OF ULCERS (sore throat, sores in mouth, or a toothache) UNUSUAL RASH, SWELLING OR PAIN  UNUSUAL VAGINAL DISCHARGE OR ITCHING   Items with * indicate a potential emergency and should be followed up as soon as possible or go to the Emergency Department if any problems should occur.  Please show the CHEMOTHERAPY ALERT CARD or IMMUNOTHERAPY ALERT CARD at check-in to the  Emergency Department and triage nurse.  Should you have questions after your visit or need to cancel or reschedule your appointment, please contact [facility] [place]  [phone] and follow the prompts.  Office hours are 8:00 a.m. to 4:30 p.m. Monday - Friday. Please note that voicemails left after 4:00 p.m. may not be returned until the following business day.  We are closed weekends and major holidays. You have access to a nurse at all times for urgent questions. Please call the main number to the clinic [phone] and follow the prompts.  For any non-urgent questions, you may also contact your provider using MyChart. We now offer e-Visits for anyone 33 and older to request care online for non-urgent symptoms. For details visit [email or web address].   Also download the MyChart app! Go to the app store, search "MyChart", open the app, select [facility], and log in with your MyChart username and password.  Masks are optional in the cancer centers. If you would like for your care team to wear a mask while they are taking care of you, please let them know. For doctor visits, patients may have with them one support person who is at least 68 years old. At this time, visitors are not allowed in the infusion area.   Ado-Trastuzumab Emtansine Injection What is this medication? ADO-TRASTUZUMAB EMTANSINE (ADD oh traz TOO zuh mab em TAN zine) treats breast cancer. It works by blocking a protein that causes cancer cells to grow and multiply. This helps to slow or stop the spread of cancer cells. This medicine may be used for other purposes; ask  your health care provider or pharmacist if you have questions. COMMON BRAND NAME(S): Kadcyla What should I tell my care team before I take this medication? They need to know if you have any of these conditions: Heart failure Liver disease Low platelet levels Lung disease Tingling of the fingers or toes or other nerve disorder An  unusual or allergic reaction to ado-trastuzumab emtansine, other medications, foods, dyes, or preservatives Pregnant or trying to get pregnant Breast-feeding How should I use this medication? This medication is infused into a vein. It is given by your care team in a hospital or clinic setting. Talk to your care team about the use of this medication in children. Special care may be needed. Overdosage: If you think you have taken too much of this medicine contact a poison control center or emergency room at once. NOTE: This medicine is only for you. Do not share this medicine with others. What if I miss a dose? Keep appointments for follow-up doses. It is important not to miss your dose. Call your care team if you are unable to keep an appointment. What may interact with this medication? Atazanavir Boceprevir Clarithromycin Dalfopristin; quinupristin Delavirdine Indinavir Isoniazid, INH Itraconazole Ketoconazole Nefazodone Nelfinavir Ritonavir Telaprevir Telithromycin Tipranavir Voriconazole This list may not describe all possible interactions. Give your health care provider a list of all the medicines, herbs, non-prescription drugs, or dietary supplements you use. Also tell them if you smoke, drink alcohol, or use illegal drugs. Some items may interact with your medicine. What should I watch for while using this medication? This medication may make you feel generally unwell. This is not uncommon, as chemotherapy can affect healthy cells as well as cancer cells. Report any side effects. Continue your course of treatment even though you feel ill unless your care team tells you to stop. You may need blood work while taking this medication. This medication may increase your risk to bruise or bleed. Call your care team if you notice any unusual bleeding. Be careful brushing or flossing your teeth or using a toothpick because you may get an infection or bleed more easily. If you have any  dental work done, tell your dentist you are receiving this medication. Talk to your care team if you may be pregnant. Serious birth defects can occur if you take this medication during pregnancy and for 7 months after the last dose. You will need a negative pregnancy test before starting this medication. Contraception is recommended while taking this medication and for 7 months after the last dose. Your care team can help you find the option that works for you. If your partner can get pregnant, use a condom during sex while taking this medication and for 4 months after the last dose. Do not breastfeed while taking this medication and for 7 months after the last dose. This medication may cause infertility. Talk to your care team if you are concerned with your fertility. What side effects may I notice from receiving this medication? Side effects that you should report to your care team as soon as possible: Allergic reactions--skin rash, itching, hives, swelling of the face, lips, tongue, or throat Bleeding--bloody or black, tar-like stools, vomiting blood or brown material that looks like coffee grounds, red or dark brown urine, small red or purple spots on skin, unusual bruising or bleeding Dry cough, shortness of breath or trouble breathing Heart failure--shortness of breath, swelling of the ankles, feet, or hands, sudden weight gain, unusual weakness or fatigue Infusion reactions--chest pain,  shortness of breath or trouble breathing, feeling faint or lightheaded Liver injury--right upper belly pain, loss of appetite, nausea, light-colored stool, dark yellow or brown urine, yellowing skin or eyes, unusual weakness or fatigue Pain, tingling, or numbness in the hands or feet Painful swelling, warmth, or redness of the skin, blisters or sores at the infusion site Side effects that usually do not require medical attention (report to your care team if they continue or are  bothersome): Constipation Fatigue Headache Muscle pain Nausea This list may not describe all possible side effects. Call your doctor for medical advice about side effects. You may report side effects to FDA at 1-800-FDA-1088. Where should I keep my medication? This medication is given in a hospital or clinic. It will not be stored at home. NOTE: This sheet is a summary. It may not cover all possible information. If you have questions about this medicine, talk to your doctor, pharmacist, or health care provider.  2023 Elsevier/Gold Standard (2021-12-20 00:00:00)

## 2022-06-27 NOTE — Assessment & Plan Note (Signed)
Right breast cT1c cN0 invasive carcinoma, ER/PR positive, HER2 positive tumor size appears larger, 2.5cm,  T2  on MRI,  Baseline normal CA 27-29, CEA 15-3 S/p TCH x2, and TCHP x4 followed by lumpectomy and sentinel lymph node biopsy. ypT1a ypN0- Residual disease after neoadjuvant chemotherapy, status post adjuvant radiation Labs reviewed and discussed with patient. Proceed with Kadcyla today.  Continue tamoxifen 20 mg daily.-Patient is on Eliquis for atrial fibrillation. Recommend gynecology evaluation annually-refer to gynecology 

## 2022-06-27 NOTE — Assessment & Plan Note (Signed)
Due to chronic disease and previous chemotherapy. Hemoglobin stable.  Monitor

## 2022-06-27 NOTE — Assessment & Plan Note (Signed)
Recommend calcium 1200 mg  and vitamin D  daily supplementation.

## 2022-06-27 NOTE — Assessment & Plan Note (Signed)
Continue potassium supplementation.  20 mEq daily

## 2022-07-14 ENCOUNTER — Other Ambulatory Visit: Payer: Self-pay | Admitting: *Deleted

## 2022-07-14 ENCOUNTER — Telehealth: Payer: Self-pay | Admitting: Cardiology

## 2022-07-14 DIAGNOSIS — I480 Paroxysmal atrial fibrillation: Secondary | ICD-10-CM

## 2022-07-14 MED ORDER — APIXABAN 5 MG PO TABS: 5.0000 mg | ORAL_TABLET | Freq: Two times a day (BID) | ORAL | 1 refills | Status: DC

## 2022-07-14 NOTE — Telephone Encounter (Signed)
The patient is scheduled to be seen in the office on 08/01/22 with Dr. [name].   Recent labs on 06/27/22.   OK to refill for 30-days until seen in office?

## 2022-07-14 NOTE — Telephone Encounter (Signed)
Pt c/o medication issue:  1. Name of Medication: apixaban (ELIQUIS) 5 MG TABS tablet   2. How are you currently taking this medication (dosage and times per day)? As written  3. Are you having a reaction (difficulty breathing--STAT)? No    4. What is your medication issue? Patient needs a new 90 day, 3 refill prescription sent to [id number] [place] [place]

## 2022-07-14 NOTE — Telephone Encounter (Addendum)
Eliquis '5mg'$  refill request received. Patient is 66 years old, weight-78.8kg, Crea-0.85 on 06/27/2022, Diagnosis-Afib, and last seen by Dr. [name] on 02/08/2021. Dose is appropriate based on dosing criteria.   PT NEEDS AN APPT WITH CARDIOLOGIST. WILL SEND A MESSAGE TO SCHEDULERS FOR AN APPT.   Pt has an appt pending on 08/01/2022 at 1140am. Will send in limited refill.

## 2022-07-14 NOTE — Telephone Encounter (Signed)
Eliquis '5mg'$  refill request received. Patient is 66 years old, weight-78.8kg, Crea-0.85 on 06/27/2022, Diagnosis-Afib, and last seen by Dr. [name] on 02/08/2021. Dose is appropriate based on dosing criteria.     Pt has an appt pending on 08/01/2022 at 1140am. Will send in limited refill.

## 2022-07-18 ENCOUNTER — Inpatient Hospital Stay: Payer: Medicare PPO | Admitting: Oncology

## 2022-07-18 ENCOUNTER — Other Ambulatory Visit: Payer: Self-pay | Admitting: Oncology

## 2022-07-18 ENCOUNTER — Inpatient Hospital Stay: Payer: Medicare PPO

## 2022-07-18 NOTE — Assessment & Plan Note (Deleted)
Right breast cT1c cN0 invasive carcinoma, ER/PR positive, HER2 positive tumor size appears larger, 2.5cm,  T2  on MRI,  Baseline normal CA 27-29, CEA 15-3 S/p TCH x2, and TCHP x4 followed by lumpectomy and sentinel lymph node biopsy. ypT1a ypN0- Residual disease after neoadjuvant chemotherapy, status post adjuvant radiation Labs reviewed and discussed with patient. Proceed with Kadcyla today.  Continue tamoxifen 20 mg daily.-Patient is on Eliquis for atrial fibrillation. Recommend gynecology evaluation annually-refer to gynecology 

## 2022-07-18 NOTE — Assessment & Plan Note (Deleted)
Chemotherapy plan as above 

## 2022-07-21 ENCOUNTER — Emergency Department: Payer: Medicare PPO

## 2022-07-21 ENCOUNTER — Other Ambulatory Visit: Payer: Self-pay

## 2022-07-21 ENCOUNTER — Inpatient Hospital Stay
Admission: EM | Admit: 2022-07-21 | Discharge: 2022-07-24 | DRG: 871 | Disposition: A | Discharge status: Home / Self Care | Payer: Medicare PPO | Attending: Internal Medicine | Admitting: Internal Medicine

## 2022-07-21 ENCOUNTER — Encounter: Payer: Self-pay | Admitting: Emergency Medicine

## 2022-07-21 DIAGNOSIS — Z7983 Long term (current) use of bisphosphonates: Secondary | ICD-10-CM

## 2022-07-21 DIAGNOSIS — J9611 Chronic respiratory failure with hypoxia: Secondary | ICD-10-CM | POA: Diagnosis present

## 2022-07-21 DIAGNOSIS — Z79810 Long term (current) use of selective estrogen receptor modulators (SERMs): Secondary | ICD-10-CM

## 2022-07-21 DIAGNOSIS — F32A Depression, unspecified: Secondary | ICD-10-CM | POA: Diagnosis present

## 2022-07-21 DIAGNOSIS — G9341 Metabolic encephalopathy: Secondary | ICD-10-CM | POA: Diagnosis present

## 2022-07-21 DIAGNOSIS — J189 Pneumonia, unspecified organism: Secondary | ICD-10-CM | POA: Diagnosis present

## 2022-07-21 DIAGNOSIS — Z8051 Family history of malignant neoplasm of kidney: Secondary | ICD-10-CM

## 2022-07-21 DIAGNOSIS — N390 Urinary tract infection, site not specified: Secondary | ICD-10-CM | POA: Diagnosis present

## 2022-07-21 DIAGNOSIS — Z9049 Acquired absence of other specified parts of digestive tract: Secondary | ICD-10-CM

## 2022-07-21 DIAGNOSIS — K219 Gastro-esophageal reflux disease without esophagitis: Secondary | ICD-10-CM | POA: Diagnosis present

## 2022-07-21 DIAGNOSIS — Z1152 Encounter for screening for COVID-19: Secondary | ICD-10-CM

## 2022-07-21 DIAGNOSIS — I10 Essential (primary) hypertension: Secondary | ICD-10-CM | POA: Diagnosis present

## 2022-07-21 DIAGNOSIS — R6520 Severe sepsis without septic shock: Secondary | ICD-10-CM | POA: Diagnosis not present

## 2022-07-21 DIAGNOSIS — Z825 Family history of asthma and other chronic lower respiratory diseases: Secondary | ICD-10-CM

## 2022-07-21 DIAGNOSIS — Z6835 Body mass index (BMI) 35.0-35.9, adult: Secondary | ICD-10-CM

## 2022-07-21 DIAGNOSIS — D649 Anemia, unspecified: Secondary | ICD-10-CM | POA: Diagnosis present

## 2022-07-21 DIAGNOSIS — R441 Visual hallucinations: Secondary | ICD-10-CM | POA: Diagnosis present

## 2022-07-21 DIAGNOSIS — C50919 Malignant neoplasm of unspecified site of unspecified female breast: Secondary | ICD-10-CM | POA: Diagnosis present

## 2022-07-21 DIAGNOSIS — A419 Sepsis, unspecified organism: Principal | ICD-10-CM

## 2022-07-21 DIAGNOSIS — Z9011 Acquired absence of right breast and nipple: Secondary | ICD-10-CM

## 2022-07-21 DIAGNOSIS — I700 Atherosclerosis of aorta: Secondary | ICD-10-CM | POA: Diagnosis present

## 2022-07-21 DIAGNOSIS — E871 Hypo-osmolality and hyponatremia: Secondary | ICD-10-CM | POA: Diagnosis present

## 2022-07-21 DIAGNOSIS — E876 Hypokalemia: Secondary | ICD-10-CM | POA: Diagnosis present

## 2022-07-21 DIAGNOSIS — Z79899 Other long term (current) drug therapy: Secondary | ICD-10-CM

## 2022-07-21 DIAGNOSIS — J440 Chronic obstructive pulmonary disease with acute lower respiratory infection: Secondary | ICD-10-CM | POA: Diagnosis present

## 2022-07-21 DIAGNOSIS — G4700 Insomnia, unspecified: Secondary | ICD-10-CM | POA: Diagnosis present

## 2022-07-21 DIAGNOSIS — E785 Hyperlipidemia, unspecified: Secondary | ICD-10-CM | POA: Diagnosis present

## 2022-07-21 DIAGNOSIS — D638 Anemia in other chronic diseases classified elsewhere: Secondary | ICD-10-CM | POA: Diagnosis present

## 2022-07-21 DIAGNOSIS — Z9221 Personal history of antineoplastic chemotherapy: Secondary | ICD-10-CM

## 2022-07-21 DIAGNOSIS — Z923 Personal history of irradiation: Secondary | ICD-10-CM

## 2022-07-21 DIAGNOSIS — Z87891 Personal history of nicotine dependence: Secondary | ICD-10-CM

## 2022-07-21 DIAGNOSIS — F419 Anxiety disorder, unspecified: Secondary | ICD-10-CM | POA: Diagnosis present

## 2022-07-21 DIAGNOSIS — I4891 Unspecified atrial fibrillation: Secondary | ICD-10-CM | POA: Diagnosis present

## 2022-07-21 DIAGNOSIS — M79604 Pain in right leg: Secondary | ICD-10-CM | POA: Diagnosis present

## 2022-07-21 DIAGNOSIS — Z7901 Long term (current) use of anticoagulants: Secondary | ICD-10-CM

## 2022-07-21 DIAGNOSIS — G4733 Obstructive sleep apnea (adult) (pediatric): Secondary | ICD-10-CM | POA: Diagnosis present

## 2022-07-21 DIAGNOSIS — G9340 Encephalopathy, unspecified: Secondary | ICD-10-CM | POA: Diagnosis present

## 2022-07-21 DIAGNOSIS — G8929 Other chronic pain: Secondary | ICD-10-CM | POA: Diagnosis present

## 2022-07-21 DIAGNOSIS — Z818 Family history of other mental and behavioral disorders: Secondary | ICD-10-CM

## 2022-07-21 DIAGNOSIS — J4489 Other specified chronic obstructive pulmonary disease: Secondary | ICD-10-CM | POA: Diagnosis present

## 2022-07-21 DIAGNOSIS — N179 Acute kidney failure, unspecified: Secondary | ICD-10-CM | POA: Diagnosis present

## 2022-07-21 DIAGNOSIS — Z808 Family history of malignant neoplasm of other organs or systems: Secondary | ICD-10-CM

## 2022-07-21 DIAGNOSIS — Z87440 Personal history of urinary (tract) infections: Secondary | ICD-10-CM

## 2022-07-21 DIAGNOSIS — M8580 Other specified disorders of bone density and structure, unspecified site: Secondary | ICD-10-CM | POA: Diagnosis present

## 2022-07-21 DIAGNOSIS — Z9981 Dependence on supplemental oxygen: Secondary | ICD-10-CM

## 2022-07-21 DIAGNOSIS — Z881 Allergy status to other antibiotic agents status: Secondary | ICD-10-CM

## 2022-07-21 DIAGNOSIS — Z91018 Allergy to other foods: Secondary | ICD-10-CM

## 2022-07-21 DIAGNOSIS — Z823 Family history of stroke: Secondary | ICD-10-CM

## 2022-07-21 DIAGNOSIS — Z8249 Family history of ischemic heart disease and other diseases of the circulatory system: Secondary | ICD-10-CM

## 2022-07-21 DIAGNOSIS — Z79890 Hormone replacement therapy (postmenopausal): Secondary | ICD-10-CM

## 2022-07-21 DIAGNOSIS — E861 Hypovolemia: Secondary | ICD-10-CM | POA: Diagnosis present

## 2022-07-21 DIAGNOSIS — Z801 Family history of malignant neoplasm of trachea, bronchus and lung: Secondary | ICD-10-CM

## 2022-07-21 DIAGNOSIS — Z803 Family history of malignant neoplasm of breast: Secondary | ICD-10-CM

## 2022-07-21 DIAGNOSIS — Z170 Estrogen receptor positive status [ER+]: Secondary | ICD-10-CM

## 2022-07-21 LAB — TROPONIN I (HIGH SENSITIVITY)
Troponin I (High Sensitivity): 8 ng/L (ref <18)
Troponin I (High Sensitivity): 5 ng/L (ref <18)

## 2022-07-21 LAB — CBC WITH DIFFERENTIAL/PLATELET
Abs Immature Granulocytes: 0.81 10*3/uL — ABNORMAL HIGH (ref 0.00–0.07)
Basophils Absolute: 0.1 10*3/uL (ref 0.0–0.1)
Basophils Relative: 0 %
Eosinophils Absolute: 0 10*3/uL (ref 0.0–0.5)
Eosinophils Relative: 0 %
HCT: 31.5 % — ABNORMAL LOW (ref 36.0–46.0)
Hemoglobin: 10 g/dL — ABNORMAL LOW (ref 12.0–15.0)
Immature Granulocytes: 4 %
Lymphocytes Relative: 6 %
Lymphs Abs: 1.4 10*3/uL (ref 0.7–4.0)
MCH: 28.6 pg (ref 26.0–34.0)
MCHC: 31.7 g/dL (ref 30.0–36.0)
MCV: 90 fL (ref 80.0–100.0)
Monocytes Absolute: 1.3 10*3/uL — ABNORMAL HIGH (ref 0.1–1.0)
Monocytes Relative: 6 %
Neutro Abs: 19.4 10*3/uL — ABNORMAL HIGH (ref 1.7–7.7)
Neutrophils Relative %: 84 %
Platelets: 298 10*3/uL (ref 150–400)
RBC: 3.5 MIL/uL — ABNORMAL LOW (ref 3.87–5.11)
RDW: 14.6 % (ref 11.5–15.5)
WBC: 23 10*3/uL — ABNORMAL HIGH (ref 4.0–10.5)
nRBC: 0 % (ref 0.0–0.2)

## 2022-07-21 LAB — BASIC METABOLIC PANEL
Anion gap: 13 (ref 5–15)
BUN: 20 mg/dL (ref 8–23)
CO2: 26 mmol/L (ref 22–32)
Calcium: 8.8 mg/dL — ABNORMAL LOW (ref 8.9–10.3)
Chloride: 90 mmol/L — ABNORMAL LOW (ref 98–111)
Creatinine, Ser: 1.65 mg/dL — ABNORMAL HIGH (ref 0.44–1.00)
GFR, Estimated: 34 mL/min — ABNORMAL LOW (ref >60)
Glucose, Bld: 113 mg/dL — ABNORMAL HIGH (ref 70–99)
Potassium: 3 mmol/L — ABNORMAL LOW (ref 3.5–5.1)
Sodium: 129 mmol/L — ABNORMAL LOW (ref 135–145)

## 2022-07-21 LAB — LACTIC ACID, PLASMA: Lactic Acid, Venous: 1.8 mmol/L (ref 0.5–1.9)

## 2022-07-21 LAB — PROTIME-INR
INR: 1.8 — ABNORMAL HIGH (ref 0.8–1.2)
Prothrombin Time: 20.4 seconds — ABNORMAL HIGH (ref 11.4–15.2)

## 2022-07-21 LAB — HEPATIC FUNCTION PANEL
ALT: 14 U/L (ref 0–44)
AST: 21 U/L (ref 15–41)
Albumin: 2 g/dL — ABNORMAL LOW (ref 3.5–5.0)
Alkaline Phosphatase: 73 U/L (ref 38–126)
Bilirubin, Direct: 0.3 mg/dL — ABNORMAL HIGH (ref 0.0–0.2)
Indirect Bilirubin: 0.7 mg/dL (ref 0.3–0.9)
Total Bilirubin: 1 mg/dL (ref 0.3–1.2)
Total Protein: 6.3 g/dL — ABNORMAL LOW (ref 6.5–8.1)

## 2022-07-21 LAB — BRAIN NATRIURETIC PEPTIDE: B Natriuretic Peptide: 73.5 pg/mL (ref 0.0–100.0)

## 2022-07-21 MED ORDER — LACTATED RINGERS IV BOLUS
1000.0000 mL | Freq: Once | INTRAVENOUS | Status: AC
  Administered 2022-07-21: 1000 mL via INTRAVENOUS

## 2022-07-21 MED ORDER — VANCOMYCIN HCL IN DEXTROSE 1-5 GM/200ML-% IV SOLN
1000.0000 mg | Freq: Once | INTRAVENOUS | Status: AC
  Administered 2022-07-21: 1000 mg via INTRAVENOUS
  Filled 2022-07-21: qty 200

## 2022-07-21 MED ORDER — POTASSIUM CHLORIDE CRYS ER 20 MEQ PO TBCR: 40.0000 meq | EXTENDED_RELEASE_TABLET | Freq: Once | ORAL | Status: DC

## 2022-07-21 MED ORDER — ONDANSETRON HCL 4 MG PO TABS: 4.0000 mg | ORAL_TABLET | Freq: Four times a day (QID) | ORAL | Status: DC | PRN

## 2022-07-21 MED ORDER — DULOXETINE HCL 30 MG PO CPEP
30.0000 mg | ORAL_CAPSULE | Freq: Every day | ORAL | Status: DC
  Administered 2022-07-22 – 2022-07-24 (×4): 30 mg via ORAL
  Filled 2022-07-21 (×4): qty 1

## 2022-07-21 MED ORDER — ATORVASTATIN CALCIUM 20 MG PO TABS
20.0000 mg | ORAL_TABLET | Freq: Every day | ORAL | Status: DC
  Administered 2022-07-22 – 2022-07-24 (×3): 20 mg via ORAL
  Filled 2022-07-21 (×3): qty 1

## 2022-07-21 MED ORDER — NOREPINEPHRINE 4 MG/250ML-% IV SOLN
INTRAVENOUS | Status: AC
  Filled 2022-07-21: qty 250

## 2022-07-21 MED ORDER — SODIUM CHLORIDE 0.9 % IV SOLN: INTRAVENOUS | Status: DC | PRN

## 2022-07-21 MED ORDER — APIXABAN 5 MG PO TABS
5.0000 mg | ORAL_TABLET | Freq: Two times a day (BID) | ORAL | Status: DC
  Administered 2022-07-22 – 2022-07-24 (×6): 5 mg via ORAL
  Filled 2022-07-21 (×6): qty 1

## 2022-07-21 MED ORDER — ONDANSETRON HCL 4 MG/2ML IJ SOLN: 4.0000 mg | Freq: Four times a day (QID) | INTRAMUSCULAR | Status: DC | PRN

## 2022-07-21 MED ORDER — LACTATED RINGERS IV BOLUS
500.0000 mL | Freq: Once | INTRAVENOUS | Status: AC
  Administered 2022-07-21: 500 mL via INTRAVENOUS

## 2022-07-21 MED ORDER — IPRATROPIUM-ALBUTEROL 0.5-2.5 (3) MG/3ML IN SOLN
3.0000 mL | Freq: Once | RESPIRATORY_TRACT | Status: AC
  Administered 2022-07-21: 3 mL via RESPIRATORY_TRACT
  Filled 2022-07-21: qty 3

## 2022-07-21 MED ORDER — POTASSIUM CHLORIDE 10 MEQ/100ML IV SOLN
10.0000 meq | INTRAVENOUS | Status: AC
  Administered 2022-07-21 – 2022-07-22 (×3): 10 meq via INTRAVENOUS
  Filled 2022-07-21 (×2): qty 100

## 2022-07-21 MED ORDER — NOREPINEPHRINE 4 MG/250ML-% IV SOLN: 0.0000 ug/min | INTRAVENOUS | Status: DC

## 2022-07-21 MED ORDER — ACETAMINOPHEN 650 MG RE SUPP: 650.0000 mg | Freq: Four times a day (QID) | RECTAL | Status: DC | PRN

## 2022-07-21 MED ORDER — ACETAMINOPHEN 325 MG PO TABS
650.0000 mg | ORAL_TABLET | Freq: Four times a day (QID) | ORAL | Status: DC | PRN
  Administered 2022-07-22 – 2022-07-23 (×2): 650 mg via ORAL
  Filled 2022-07-21 (×2): qty 2

## 2022-07-21 MED ORDER — ALBUTEROL SULFATE (2.5 MG/3ML) 0.083% IN NEBU: 2.5000 mg | INHALATION_SOLUTION | RESPIRATORY_TRACT | Status: DC | PRN

## 2022-07-21 MED ORDER — SODIUM CHLORIDE 0.9 % IV SOLN
1.0000 g | Freq: Two times a day (BID) | INTRAVENOUS | Status: DC
  Administered 2022-07-22: 1 g via INTRAVENOUS
  Filled 2022-07-21: qty 20

## 2022-07-21 MED ORDER — CITALOPRAM HYDROBROMIDE 20 MG PO TABS
10.0000 mg | ORAL_TABLET | Freq: Every day | ORAL | Status: DC
  Administered 2022-07-22 – 2022-07-24 (×4): 10 mg via ORAL
  Filled 2022-07-21 (×4): qty 1

## 2022-07-21 MED ORDER — SODIUM CHLORIDE 0.9 % IV SOLN
1.0000 g | Freq: Once | INTRAVENOUS | Status: AC
  Administered 2022-07-21: 1 g via INTRAVENOUS
  Filled 2022-07-21: qty 20

## 2022-07-21 NOTE — ED Notes (Signed)
LAB IN ROOM TO DRAW BLOOD CULTURES AND LACTIC ACID

## 2022-07-21 NOTE — Progress Notes (Signed)
Pharmacy Antibiotic Note  BRIZEIDA MCMURRY is a 66 y.o. female admitted on 07/21/2022 with sepsis.  Pharmacy has been consulted for Meropenem dosing.  Plan: Meropenem 1 gm IV X 1 given in ED on 12/4 @ 2008. Meropenem 1 gm IV Q12H ordered to start on 12/5 @ 0800.   Height: '5\' 5"'$  (165.1 cm) Weight: 78.5 kg (173 lb) IBW/kg (Calculated) : 57  Temp (24hrs), Avg:98.2 F (36.8 C), Min:97.8 F (36.6 C), Max:98.5 F (36.9 C)  Recent Labs  Lab 07/21/22 1719 07/21/22 1806  WBC 23.0*  --   CREATININE 1.65*  --   LATICACIDVEN  --  1.8    Estimated Creatinine Clearance: 33.8 mL/min (A) (by C-G formula based on SCr of 1.65 mg/dL (H)).    Allergies  Allergen Reactions   Cephalexin Hives   Strawberry Extract Hives    Antimicrobials this admission:   >>    >>   Dose adjustments this admission:   Microbiology results:  BCx:   UCx:    Sputum:    MRSA PCR:   Thank you for allowing pharmacy to be a part of this patient's care.  Ceaira Ernster D 07/21/2022 11:28 PM

## 2022-07-21 NOTE — Consult Note (Signed)
PHARMACY -  BRIEF ANTIBIOTIC NOTE   Pharmacy has received consult(s) for sepsis from an ED provider.  The patient's profile has been reviewed for ht/wt/allergies/indication/available labs.    One time order(s) placed for meropenem and vancomycin  Further antibiotics/pharmacy consults should be ordered by admitting physician if indicated.                       Thank you, Oswald Hillock 07/21/2022  6:10 PM

## 2022-07-21 NOTE — ED Provider Triage Note (Signed)
Emergency Medicine Provider Triage Evaluation Note  KETURA SIREK , a 66 y.o. female  was evaluated in triage.  Pt complains of auditory and visual hallucinations and falling asleep for 6 days. Currently on chemo for breast cancer. On chronic 2L O2 [place] for COPD.    Review of Systems  Positive: Ams, sob,  Negative: Cp/sob, fever  Physical Exam  There were no vitals taken for this visit. Gen:   Falling asleep during exam Resp:  Normal effort  MSK:   Moves extremities without difficulty  Other:    Medical Decision Making  Medically screening exam initiated at 5:18 PM.  Appropriate orders placed.  Marianna Payment was informed that the remainder of the evaluation will be completed by another provider, this initial triage assessment does not replace that evaluation, and the importance of remaining in the ED until their evaluation is complete.     Marquette Old, PA-C 07/21/22 1737

## 2022-07-21 NOTE — Assessment & Plan Note (Addendum)
Chronic anticoagulation Currently rate controlled Continue apixaban
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
## 2022-07-21 NOTE — Assessment & Plan Note (Signed)
Secondary to sepsis, resolved with IV fluids

## 2022-07-21 NOTE — Assessment & Plan Note (Addendum)
Pneumonia Severe sepsis criteria includes hypotension, leukocytosis, acute encephalopathy, AKI. Patient had dysuria so follow-up UA Follow-up COVID and flu Continue sepsis fluids We will continue meropenem Supportive care

## 2022-07-21 NOTE — Assessment & Plan Note (Signed)
Secondary to sepsis CT head was nonacute Neurologic checks Fall and aspiration precautions We will keep n.p.o. overnight

## 2022-07-21 NOTE — Sepsis Progress Note (Signed)
Sepsis protocol is being followed by [facility]. 

## 2022-07-21 NOTE — Assessment & Plan Note (Addendum)
s/p lumpectomy and lymph node biopsy s/p radiation and neoadjuvant chemotherapy on Kadcyla and tamoxifen Followed by Dr Talbert Cage

## 2022-07-21 NOTE — Consult Note (Signed)
CODE SEPSIS - PHARMACY COMMUNICATION  **Broad Spectrum Antibiotics should be administered within 1 hour of Sepsis diagnosis**  Time Code Sepsis Called/Page Received: 1805  Antibiotics Ordered: meropenem and vancomycin  Time of 1st antibiotic administration: 2008  Additional action taken by pharmacy: messaged RN.   If necessary, Name of Provider/Nurse Contacted: MeKenzie     Oswald Hillock ,PharmD Clinical Pharmacist  07/21/2022  8:15 PM

## 2022-07-21 NOTE — Assessment & Plan Note (Signed)
Not acutely exacerbated, no wheezing on exam. Continue home inhalers Duonebs prn

## 2022-07-21 NOTE — ED Triage Notes (Incomplete)
Patient to ED via POV with family for AMS since Tuesday gradually worsening. Husband reports hallucinations. Currently getting chemo- last treatment 3 weeks ago for Breast Cancer. Wears 2L [place] at baseline. Hx of COPD.

## 2022-07-21 NOTE — ED Provider Notes (Signed)
[facility] Provider Note    Event Date/Time   First MD Initiated Contact with Patient 07/21/22 1737     (approximate)   History   Altered Mental Status   HPI  Debra [name] is a 66 y.o. female  here with sepsis, altered mental status. Pt arrives with husband who provides mosto f history. Pt has h/o breast CA, currently being treated, COPD, AFib. She has had increasing weakness and confusion over the past week with poor PO intake. She has worsened over the past 24 hours and even begun seeing things that aren't there. She feels generally weak. She has a chronic cough but does not necessarily feel more SOB. She has had foul-smelling urine as well though denies overt dysuria. No flank pain. She has a h/o recurrent UTIs.       Physical Exam   Triage Vital Signs: ED Triage Vitals  Enc Vitals Group     BP 07/21/22 1724 (!) 68/51     Pulse Rate 07/21/22 1724 82     Resp 07/21/22 1724 17     Temp 07/21/22 1724 98.5 F (36.9 C)     Temp Source 07/21/22 1724 Oral     SpO2 07/21/22 1724 (!) 89 %     Weight 07/21/22 2010 173 lb (78.5 kg)     Height 07/21/22 2010 '5\' 5"'$  (1.651 m)     Head Circumference --      Peak Flow --      Pain Score --      Pain Loc --      Pain Edu? --      Excl. in [place]? --     Most recent vital signs: Vitals:   07/21/22 2300 07/21/22 2330  BP: (!) 93/53 92/60  Pulse: 66 74  Resp: 16 12  Temp:    SpO2: 99% 100%     General: Drowsy but answers questions appropriately.  CV:  Good peripheral perfusion. Regular rate. Resp:  Normal effort. Lungs CTAB. Abd:  No distention. No tenderness.  Other:  Dry MM. Appears pale. No focal neuro deficits.   ED Results / Procedures / Treatments   Labs (all labs ordered are listed, but only abnormal results are displayed) Labs Reviewed  CBC WITH DIFFERENTIAL/PLATELET - Abnormal; Notable for the following components:      Result Value   WBC 23.0 (*)    RBC 3.50 (*)    Hemoglobin 10.0  (*)    HCT 31.5 (*)    Neutro Abs 19.4 (*)    Monocytes Absolute 1.3 (*)    Abs Immature Granulocytes 0.81 (*)    All other components within normal limits  BASIC METABOLIC PANEL - Abnormal; Notable for the following components:   Sodium 129 (*)    Potassium 3.0 (*)    Chloride 90 (*)    Glucose, Bld 113 (*)    Creatinine, Ser 1.65 (*)    Calcium 8.8 (*)    GFR, Estimated 34 (*)    All other components within normal limits  BLOOD GAS, VENOUS - Abnormal; Notable for the following components:   Bicarbonate 30.9 (*)    Acid-Base Excess 3.8 (*)    All other components within normal limits  PROTIME-INR - Abnormal; Notable for the following components:   Prothrombin Time 20.4 (*)    INR 1.8 (*)    All other components within normal limits  HEPATIC FUNCTION PANEL - Abnormal; Notable for the following components:   Total  Protein 6.3 (*)    Albumin 2.0 (*)    Bilirubin, Direct 0.3 (*)    All other components within normal limits  RESP PANEL BY RT-PCR (FLU A&B, COVID) ARPGX2  CULTURE, BLOOD (ROUTINE X 2)  CULTURE, BLOOD (ROUTINE X 2)  URINE CULTURE  SARS CORONAVIRUS 2 BY RT PCR  BRAIN NATRIURETIC PEPTIDE  LACTIC ACID, PLASMA  URINALYSIS, ROUTINE W REFLEX MICROSCOPIC  LACTIC ACID, PLASMA  HIV ANTIBODY (ROUTINE TESTING W REFLEX)  BASIC METABOLIC PANEL  CBC  TROPONIN I (HIGH SENSITIVITY)  TROPONIN I (HIGH SENSITIVITY)     EKG Normal sinus rhythm, VR 74. PR 139, QRS 97, QTc 460. No acute st elevations or depressions. No ischemia or infarct.   RADIOLOGY CT Head: NAICA CXR: R sided PNA   I also independently reviewed and agree with radiologist interpretations.   PROCEDURES:  Critical Care performed: Yes, see critical care procedure note(s)  .Critical Care  Performed by: Duffy Bruce, MD Authorized by: Duffy Bruce, MD   Critical care provider statement:    Critical care time (minutes):  30   Critical care time was exclusive of:  Separately billable procedures  and treating other patients   Critical care was necessary to treat or prevent imminent or life-threatening deterioration of the following conditions:  Cardiac failure, circulatory failure, respiratory failure and sepsis   Critical care was time spent personally by me on the following activities:  Development of treatment plan with patient or surrogate, discussions with consultants, evaluation of patient's response to treatment, examination of patient, ordering and review of laboratory studies, ordering and review of radiographic studies, ordering and performing treatments and interventions, pulse oximetry, re-evaluation of patient's condition and review of old charts   Care discussed with: admitting provider       [facility]: Medications  potassium chloride 10 mEq in 100 mL IVPB (10 mEq Intravenous New Bag/Given 07/21/22 2304)  0.9 %  sodium chloride infusion (has no administration in time range)  atorvastatin (LIPITOR) tablet 20 mg (has no administration in time range)  citalopram (CELEXA) tablet 10 mg (has no administration in time range)  DULoxetine (CYMBALTA) DR capsule 30 mg (has no administration in time range)  apixaban (ELIQUIS) tablet 5 mg (has no administration in time range)  albuterol (PROVENTIL) (2.5 MG/3ML) 0.083% nebulizer solution 2.5 mg (has no administration in time range)  acetaminophen (TYLENOL) tablet 650 mg (has no administration in time range)    Or  acetaminophen (TYLENOL) suppository 650 mg (has no administration in time range)  ondansetron (ZOFRAN) tablet 4 mg (has no administration in time range)    Or  ondansetron (ZOFRAN) injection 4 mg (has no administration in time range)  meropenem (MERREM) 1 g in sodium chloride 0.9 % 100 mL IVPB (has no administration in time range)  norepinephrine (LEVOPHED) 4-5 MG/250ML-% infusion SOLN (has no administration in time range)  lactated ringers bolus 1,000 mL (0 mLs Intravenous Stopped 07/21/22 2044)  lactated  ringers bolus 1,000 mL (0 mLs Intravenous Stopped 07/21/22 2044)  lactated ringers bolus 500 mL (0 mLs Intravenous Stopped 07/21/22 2044)  ipratropium-albuterol (DUONEB) 0.5-2.5 (3) MG/3ML nebulizer solution 3 mL (3 mLs Nebulization Given 07/21/22 1956)  meropenem (MERREM) 1 g in sodium chloride 0.9 % 100 mL IVPB (0 g Intravenous Stopped 07/21/22 2044)  vancomycin (VANCOCIN) IVPB 1000 mg/200 mL premix (0 mg Intravenous Stopped 07/21/22 2148)  lactated ringers bolus 1,000 mL (0 mLs Intravenous Stopped 07/21/22 2254)     IMPRESSION / MDM /  ASSESSMENT AND PLAN / ED COURSE  I reviewed the triage vital signs and the nursing notes.                              Differential diagnosis includes, but is not limited to, sepsis 2/2 UTI, PNA, metabolic encephalopathy, CVA, ICH, polypharmacy, AKI with uremia  Patient's presentation is most consistent with acute presentation with potential threat to life or bodily function.  The patient is on the cardiac monitor to evaluate for evidence of arrhythmia and/or significant heart rate changes.  66 yo F with PMHx breast CA currently on Chemo here with weakness, fatigue, and new hallucinations/AMS. Suspect acute metabolic encephalopathy/delirium, possibly in setting of UTI versus PNA. Pt has h/o UTIs and has had some urinary sx. Pt meets sepsis criteria so broad-spectrum ABX started with 30 cc/kg fluid resuscitation. Meropenem given 2/2 allergies and h/o ESBL on prior UTIs. CXR shows PNA - she has h/o chronic COPD but no increase in cough recently. No CO2 retention on CBG. Renal function with AKI with baseline Cr, 0.8-0.9 and Cr 1.65 today. HypoK, HypoNA likely from poor PO intake. LA normal.  Pt BP remains 65K systolic but MAPs >35. Will admit to step down. IVF, IV ABX given. CT imaging obtained and reviewed above, shows no acute surgical abnormality.      FINAL CLINICAL IMPRESSION(S) / ED DIAGNOSES   Final diagnoses:  Sepsis, due to unspecified organism,  unspecified whether acute organ dysfunction present ([place])  AKI (acute kidney injury) ([place])  Acute encephalopathy     Rx / DC Orders   ED Discharge Orders     None        Note:  This document was prepared using Dragon voice recognition software and may include unintentional dictation errors.   Duffy Bruce, MD 07/21/22 [phone]

## 2022-07-21 NOTE — ED Triage Notes (Signed)
First Nurse Note:  Family reports increased confusion and hallucinations x 1 week.

## 2022-07-22 ENCOUNTER — Other Ambulatory Visit: Payer: Self-pay

## 2022-07-22 DIAGNOSIS — R6520 Severe sepsis without septic shock: Secondary | ICD-10-CM | POA: Diagnosis not present

## 2022-07-22 DIAGNOSIS — Z87440 Personal history of urinary (tract) infections: Secondary | ICD-10-CM | POA: Diagnosis present

## 2022-07-22 DIAGNOSIS — A419 Sepsis, unspecified organism: Secondary | ICD-10-CM | POA: Diagnosis not present

## 2022-07-22 DIAGNOSIS — E871 Hypo-osmolality and hyponatremia: Secondary | ICD-10-CM | POA: Diagnosis present

## 2022-07-22 LAB — BASIC METABOLIC PANEL
Anion gap: 6 (ref 5–15)
BUN: 17 mg/dL (ref 8–23)
CO2: 29 mmol/L (ref 22–32)
Calcium: 8 mg/dL — ABNORMAL LOW (ref 8.9–10.3)
Chloride: 100 mmol/L (ref 98–111)
Creatinine, Ser: 0.97 mg/dL (ref 0.44–1.00)
GFR, Estimated: >60 mL/min (ref >60)
Glucose, Bld: 95 mg/dL (ref 70–99)
Potassium: 3.1 mmol/L — ABNORMAL LOW (ref 3.5–5.1)
Sodium: 135 mmol/L (ref 135–145)

## 2022-07-22 LAB — CBC
HCT: 25.4 % — ABNORMAL LOW (ref 36.0–46.0)
Hemoglobin: 8.2 g/dL — ABNORMAL LOW (ref 12.0–15.0)
MCH: 28.7 pg (ref 26.0–34.0)
MCHC: 32.3 g/dL (ref 30.0–36.0)
MCV: 88.8 fL (ref 80.0–100.0)
Platelets: 202 10*3/uL (ref 150–400)
RBC: 2.86 MIL/uL — ABNORMAL LOW (ref 3.87–5.11)
RDW: 14.5 % (ref 11.5–15.5)
WBC: 13.3 10*3/uL — ABNORMAL HIGH (ref 4.0–10.5)
nRBC: 0 % (ref 0.0–0.2)

## 2022-07-22 LAB — URINALYSIS, ROUTINE W REFLEX MICROSCOPIC
Bilirubin Urine: NEGATIVE
Glucose, UA: NEGATIVE mg/dL
Ketones, ur: NEGATIVE mg/dL
Nitrite: NEGATIVE
Protein, ur: NEGATIVE mg/dL
Specific Gravity, Urine: 1.005 (ref 1.005–1.030)
pH: 6 (ref 5.0–8.0)

## 2022-07-22 LAB — HIV ANTIBODY (ROUTINE TESTING W REFLEX): HIV Screen 4th Generation wRfx: NONREACTIVE

## 2022-07-22 LAB — SARS CORONAVIRUS 2 BY RT PCR: SARS Coronavirus 2 by RT PCR: NEGATIVE

## 2022-07-22 MED ORDER — MELATONIN 5 MG PO TABS
5.0000 mg | ORAL_TABLET | Freq: Every day | ORAL | Status: DC
  Administered 2022-07-22 – 2022-07-23 (×2): 5 mg via ORAL
  Filled 2022-07-22 (×2): qty 1

## 2022-07-22 MED ORDER — POTASSIUM CHLORIDE 2 MEQ/ML IV SOLN
INTRAVENOUS | Status: DC
  Filled 2022-07-22 (×6): qty 1000

## 2022-07-22 MED ORDER — DM-GUAIFENESIN ER 30-600 MG PO TB12
1.0000 | ORAL_TABLET | Freq: Two times a day (BID) | ORAL | Status: DC
  Administered 2022-07-22 – 2022-07-24 (×4): 1 via ORAL
  Filled 2022-07-22 (×4): qty 1

## 2022-07-22 MED ORDER — SODIUM CHLORIDE 0.9 % IV BOLUS
500.0000 mL | Freq: Once | INTRAVENOUS | Status: AC
  Administered 2022-07-22: 500 mL via INTRAVENOUS

## 2022-07-22 MED ORDER — OXYCODONE HCL 5 MG PO TABS
5.0000 mg | ORAL_TABLET | ORAL | Status: AC | PRN
  Administered 2022-07-22 – 2022-07-23 (×3): 5 mg via ORAL
  Filled 2022-07-22 (×3): qty 1

## 2022-07-22 MED ORDER — SODIUM CHLORIDE 0.9 % IV SOLN
1.0000 g | Freq: Three times a day (TID) | INTRAVENOUS | Status: DC
  Administered 2022-07-22 – 2022-07-23 (×4): 1 g via INTRAVENOUS
  Filled 2022-07-22: qty 20
  Filled 2022-07-22: qty 1
  Filled 2022-07-22 (×2): qty 20
  Filled 2022-07-22: qty 1

## 2022-07-22 MED ORDER — POTASSIUM CHLORIDE CRYS ER 20 MEQ PO TBCR
40.0000 meq | EXTENDED_RELEASE_TABLET | Freq: Once | ORAL | Status: AC
  Administered 2022-07-22: 40 meq via ORAL
  Filled 2022-07-22: qty 2

## 2022-07-22 MED ORDER — PNEUMOCOCCAL 20-VAL CONJ VACC 0.5 ML IM SUSY: 0.5000 mL | PREFILLED_SYRINGE | INTRAMUSCULAR | Status: DC

## 2022-07-22 NOTE — Assessment & Plan Note (Signed)
Overall stable. Monitor CBC.

## 2022-07-22 NOTE — Assessment & Plan Note (Addendum)
CXR on admission with new airspace disease involving right middle and lower lobes, with small right pleural effusion. --Continue meropenem --Mucolytics, bronchodilators --Culture sputum for culture if productive cough --Monitor oxygenation

## 2022-07-22 NOTE — Assessment & Plan Note (Addendum)
On 2-3 L/min home oxygen, due to COPD. O2 requirements have been stable at baseline despite pneumonia.  Monitor closely.

## 2022-07-22 NOTE — Assessment & Plan Note (Signed)
Pt has history frequent UTIs including ESBL E. coli most recently in June 2023.  Reported dysuria on admission.  UA showed trace leukocytes, few bacteria, 6-10 wbc's. --Follow urine culture --Continue antibiotics for pneumonia

## 2022-07-22 NOTE — Progress Notes (Signed)
       CROSS COVER NOTE  NAME: STEFHANIE KACHMAR MRN: [id number] DOB : 07/07/1956 ATTENDING PHYSICIAN: Ezekiel Slocumb, DO    Date of Service   07/22/2022   HPI/Events of Note   Medication request received for home Oxycodone for chronic pain. PDMP reviewed.   Interventions   Assessment/Plan:  Oxycodone 5 mg q4H PRN, order will expire in 12 hrs     This document was prepared using Dragon voice recognition software and may include unintentional dictation errors.  Neomia Glass DNP, MBA, FNP-BC Nurse Practitioner [facility] Pager [phone]

## 2022-07-22 NOTE — Assessment & Plan Note (Signed)
Continue home Cymbalta, Celexa

## 2022-07-22 NOTE — Assessment & Plan Note (Signed)
K 3.1 this AM.  Replacement ordered. Monitor BMP, replace K as needed

## 2022-07-22 NOTE — Progress Notes (Signed)
Pharmacy Antibiotic Note  Debra [name] is a 66 y.o. female COPD with chronic respiratory failure on home O2 at 2-3L, anxiety and depression, invasive carcinoma of breast s/p lumpectomy and lymph node biopsy s/p radiation and neoadjuvant chemotherapy on Kadcyla and tamoxifen, atrial fibrillation on Eliquis, frequent UTIs including ESBL E. coli most recently in June 2023, who was brought to the ED with confusion a x1 week.  Patient endorses a cough admitted on 07/21/2022 with sepsis and CXR c/f new PNA.  Pharmacy has been consulted for Meropenem dosing.  Imaging: 12/4 CXR -  New right mid and lower lung airspace disease worrisome for pneumonia  Plan: Meropenem 1 gm IV Q12H >> Renal Adjustment to Meropenem 1g IV q8h  Height: '5\' 5"'$  (165.1 cm) Weight: 78.5 kg (173 lb) IBW/kg (Calculated) : 57  Temp (24hrs), Avg:98.2 F (36.8 C), Min:97.8 F (36.6 C), Max:98.5 F (36.9 C)  Recent Labs  Lab 07/21/22 1719 07/21/22 1806 07/22/22 0544  WBC 23.0*  --  13.3*  CREATININE 1.65*  --  0.97  LATICACIDVEN  --  1.8  --      Estimated Creatinine Clearance: 57.5 mL/min (by C-G formula based on SCr of 0.97 mg/dL).    Allergies  Allergen Reactions   Cephalexin Hives   Strawberry Extract Hives    Antimicrobials this admission:   Merrem (12/4>>   Dose adjustments this admission: CTM and adjust PRN. Scr 1.65 > 0.97  Microbiology results:  BCx: NG12H  UCx:  sent  Covid: negative    MRSA PCR: sent  Thank you for allowing pharmacy to be a part of this patient's care.  Shanon Brow Nirel Babler 07/22/2022 11:07 AM

## 2022-07-22 NOTE — Assessment & Plan Note (Signed)
Resume melatonin at bedtime

## 2022-07-22 NOTE — Assessment & Plan Note (Addendum)
Na 129 on admission.  Likely hypovolemic. Na normalized to 135 with IV fluids. --Monitor BMP

## 2022-07-22 NOTE — Hospital Course (Signed)
Debra [name] is a 66 y.o. female with medical history significant for COPD with chronic respiratory failure on home O2 at 2-3 L/min home O2, anxiety and depression, invasive carcinoma of breast s/p lumpectomy and lymph node biopsy s/p radiation and neoadjuvant chemotherapy on Kadcyla and tamoxifen, atrial fibrillation on Eliquis, frequent UTIs including ESBL E. coli most recently in June 2023, who was brought to the ED on 07/21/2022 for evaluation of confusion x1 week, in addition to productive cough.  History on admission was limited due to altered mental status.  ED course and data review:  Hypotensive on arrival with BP 68/51, fluid responsive to 92/61 with initial fluids, O2 sat 89% on 2 L with otherwise normal vitals.   Labs significant for WBC of 23,000 with lactic acid 1.8.   Hemoglobin at baseline at 10.   Sodium 129 and potassium 3.   Creatinine 1.65 up from baseline of 0.85.   Troponin 5 and INR 1.8.   Urinalysis concerning for UTI (trace leukocytes, few bacteria, 6-10 WBC's) COVID-19 and flu were negative. EKG no acute ischemic ST-T wave changes.   Head CT was nonacute, showed opacification of the right mastoid air cells.    Chest x-ray showed "new right mid and lower lung airspace disease worrisome for pneumonia".  Patient was given sepsis fluids.   Started on meropenem due to cephalosporin allergy and history of ESBL UTI as well as vancomycin.  Admitted to hospitalist service.

## 2022-07-22 NOTE — Assessment & Plan Note (Signed)
On Lipitor 

## 2022-07-22 NOTE — Assessment & Plan Note (Signed)
Hx of recurrent UTI including ESBL. --Continue meropenem --Follow urine culture

## 2022-07-22 NOTE — Assessment & Plan Note (Signed)
On Eliquis for A-fib ?

## 2022-07-22 NOTE — Progress Notes (Signed)
Progress Note   Patient: Debra [name] DOB: November 08, 1955 DOA: 07/21/2022     1 DOS: the patient was seen and examined on 07/22/2022   Brief hospital course: Debra [name] is a 66 y.o. female with medical history significant for COPD with chronic respiratory failure on home O2 at 2-3 L/min home O2, anxiety and depression, invasive carcinoma of breast s/p lumpectomy and lymph node biopsy s/p radiation and neoadjuvant chemotherapy on Kadcyla and tamoxifen, atrial fibrillation on Eliquis, frequent UTIs including ESBL E. coli most recently in June 2023, who was brought to the ED on 07/21/2022 for evaluation of confusion x1 week, in addition to productive cough.  History on admission was limited due to altered mental status.  ED course and data review:  Hypotensive on arrival with BP 68/51, fluid responsive to 92/61 with initial fluids, O2 sat 89% on 2 L with otherwise normal vitals.   Labs significant for WBC of 23,000 with lactic acid 1.8.   Hemoglobin at baseline at 10.   Sodium 129 and potassium 3.   Creatinine 1.65 up from baseline of 0.85.   Troponin 5 and INR 1.8.   Urinalysis concerning for UTI (trace leukocytes, few bacteria, 6-10 WBC's) COVID-19 and flu were negative. EKG no acute ischemic ST-T wave changes.   Head CT was nonacute, showed opacification of the right mastoid air cells.    Chest x-ray showed "new right mid and lower lung airspace disease worrisome for pneumonia".  Patient was given sepsis fluids.   Started on meropenem due to cephalosporin allergy and history of ESBL UTI as well as vancomycin.  Admitted to hospitalist service.   Assessment and Plan: * Severe sepsis ([place]) Due to multifocal pneumonia.  Pt met severe sepsis criteria on admission with intermittent tachypnea, leukocytosis, hypotension, acute encephalopathy, and AKI. Right middle & lower lobe PNA on CXR. Covid-19 Negative. --Patient had dysuria so follow-up UA --Follow-up respiratory  panel --Continue sepsis fluids given ongoing hypotension --Continue meropenem --Supportive care per orders     AKI (acute kidney injury) ([place]) Secondary to sepsis Creatinine on admission was 1.65, up from baseline of 0.85 Improving with IV fluids. --Continue fluids --Monitor BMP  Acute metabolic encephalopathy POA, due to sepsis/infections.  Mental status at baseline today. CT head was nonacute Neurologic checks Continue antibiotics Delirium precautions  Hyponatremia Na 129 on admission.  Likely hypovolemic. Na normalized to 135 with IV fluids. --Monitor BMP  History of recurrent UTIs Pt has history frequent UTIs including ESBL E. coli most recently in June 2023.  Reported dysuria on admission.  UA showed trace leukocytes, few bacteria, 6-10 wbc's. --Follow urine culture --Continue antibiotics for pneumonia   Urinary tract infection Hx of recurrent UTI including ESBL. --Continue meropenem --Follow urine culture  Multifocal pneumonia CXR on admission with new airspace disease involving right middle and lower lobes, with small right pleural effusion. --Continue meropenem --Mucolytics, bronchodilators --Culture sputum for culture if productive cough --Monitor oxygenation  Chronic anticoagulation On Eliquis for A-fib  Hypokalemia K 3.1 this AM.  Replacement ordered. Monitor BMP, replace K as needed  Normocytic anemia Overall stable. Monitor CBC.  Chronic respiratory failure with hypoxia (HCC) On 2-3 L/min home oxygen, due to COPD. O2 requirements have been stable at baseline despite pneumonia.  Monitor closely.  Invasive carcinoma of breast [facility]) s/p lumpectomy and lymph node biopsy s/p radiation and neoadjuvant chemotherapy on Kadcyla and tamoxifen Followed by Dr Talbert Cage  Atrial fibrillation [facility]) Chronic anticoagulation Currently rate controlled Continue apixaban  Insomnia Resume  melatonin at bedtime  Anxiety and depression Continue home Cymbalta,  Celexa  COPD with chronic bronchitis (HCC) Not acutely exacerbated, no wheezing on exam. Continue home inhalers Duonebs prn  Hyperlipidemia On Lipitor         Subjective: Pt seen in ED, holding for a bed, husband at bedside.  They both report near 100% improvement in her mental status.  She still feels woozy at times with ongoing lower BP's.  Endorses cough, but stable oxygen needs here and at home so far.  No other acute complaints at this time.  Physical Exam: Vitals:   07/22/22 0900 07/22/22 0930 07/22/22 1142 07/22/22 1600  BP: (!) 109/54 (!) 107/52 (!) 92/53 (!) 107/53  Pulse: 83 84 93 88  Resp: (!) _0 (!) 22  Temp:   98.3 F (36.8 C) 98 F (36.7 C)  TempSrc:   Oral Oral  SpO2: 95% 97% 98% 97%  Weight:      Height:       General exam: awake, alert, no acute distress HEENT: atraumatic, clear conjunctiva, anicteric sclera, moist mucus membranes, hearing grossly normal  Respiratory system: CTAB but diminished throughout, no wheezes, rales or rhonchi, normal respiratory effort on 2.5 L/min [place] O2. Cardiovascular system: normal S1/S2, RRR, no JVD, murmurs, rubs, gallops, no pedal edema.   Gastrointestinal system: soft, NT, ND, no HSM felt, +bowel sounds. Central nervous system: A&O x4. no gross focal neurologic deficits, normal speech Extremities: moves all , no edema, normal tone Skin: dry, intact, normal temperature, normal color, No rashes, lesions or ulcers Psychiatry: normal mood, congruent affect, judgement and insight appear normal   Data Reviewed:  Notable labs --  Cr improved 0.97, Na normalized 135, K 3.1 again, WBC improved 13.3, Hbg 8.2  Family Communication: husband at beside on rounds  Disposition: Status is: Inpatient Remains inpatient appropriate because: remains on IV antibiotics and IV fluids with hypotension, cultures pending   Planned Discharge Destination: Home    Time spent: 40 minutes  Author: Ezekiel Slocumb, DO 07/22/2022 4:09  PM  For on call review [email or web address].

## 2022-07-23 DIAGNOSIS — N179 Acute kidney failure, unspecified: Secondary | ICD-10-CM

## 2022-07-23 DIAGNOSIS — J9611 Chronic respiratory failure with hypoxia: Secondary | ICD-10-CM | POA: Diagnosis not present

## 2022-07-23 DIAGNOSIS — A419 Sepsis, unspecified organism: Secondary | ICD-10-CM | POA: Diagnosis not present

## 2022-07-23 DIAGNOSIS — G9341 Metabolic encephalopathy: Secondary | ICD-10-CM

## 2022-07-23 LAB — BASIC METABOLIC PANEL
Anion gap: 5 (ref 5–15)
BUN: 13 mg/dL (ref 8–23)
CO2: 29 mmol/L (ref 22–32)
Calcium: 8.8 mg/dL — ABNORMAL LOW (ref 8.9–10.3)
Chloride: 107 mmol/L (ref 98–111)
Creatinine, Ser: 0.72 mg/dL (ref 0.44–1.00)
GFR, Estimated: >60 mL/min (ref >60)
Glucose, Bld: 110 mg/dL — ABNORMAL HIGH (ref 70–99)
Potassium: 3.8 mmol/L (ref 3.5–5.1)
Sodium: 141 mmol/L (ref 135–145)

## 2022-07-23 LAB — CBC
HCT: 28.4 % — ABNORMAL LOW (ref 36.0–46.0)
Hemoglobin: 8.9 g/dL — ABNORMAL LOW (ref 12.0–15.0)
MCH: 28.2 pg (ref 26.0–34.0)
MCHC: 31.3 g/dL (ref 30.0–36.0)
MCV: 89.9 fL (ref 80.0–100.0)
Platelets: 270 10*3/uL (ref 150–400)
RBC: 3.16 MIL/uL — ABNORMAL LOW (ref 3.87–5.11)
RDW: 14.5 % (ref 11.5–15.5)
WBC: 10.2 10*3/uL (ref 4.0–10.5)
nRBC: 0 % (ref 0.0–0.2)

## 2022-07-23 LAB — URINE CULTURE: Culture: NO GROWTH

## 2022-07-23 LAB — MRSA NEXT GEN BY PCR, NASAL: MRSA by PCR Next Gen: NOT DETECTED

## 2022-07-23 LAB — MAGNESIUM: Magnesium: 1.5 mg/dL — ABNORMAL LOW (ref 1.7–2.4)

## 2022-07-23 MED ORDER — PREGABALIN 50 MG PO CAPS
100.0000 mg | ORAL_CAPSULE | Freq: Two times a day (BID) | ORAL | Status: DC
  Administered 2022-07-23 – 2022-07-24 (×2): 100 mg via ORAL
  Filled 2022-07-23 (×2): qty 2

## 2022-07-23 MED ORDER — AMOXICILLIN-POT CLAVULANATE 875-125 MG PO TABS
1.0000 | ORAL_TABLET | Freq: Two times a day (BID) | ORAL | Status: DC
  Administered 2022-07-23 – 2022-07-24 (×2): 1 via ORAL
  Filled 2022-07-23 (×3): qty 1

## 2022-07-23 MED ORDER — LOPERAMIDE HCL 2 MG PO CAPS
2.0000 mg | ORAL_CAPSULE | Freq: Once | ORAL | Status: AC
  Administered 2022-07-23: 2 mg via ORAL
  Filled 2022-07-23: qty 1

## 2022-07-23 MED ORDER — OXYCODONE HCL 5 MG PO TABS
5.0000 mg | ORAL_TABLET | Freq: Four times a day (QID) | ORAL | Status: DC | PRN
  Administered 2022-07-23 – 2022-07-24 (×2): 5 mg via ORAL
  Filled 2022-07-23 (×2): qty 1

## 2022-07-23 NOTE — Progress Notes (Signed)
  Chaplain On-Call responded to [place] from [place], DO. The request was for prayer with the patient.  Chaplain met the patient and her husband Debra [name] at the bedside. Chaplain offered spiritual and emotional support as they described the difficulty of this hospitalization brought on by pneumonia and UTI.  Chaplain provided prayer for comfort and healing.  Chaplain Pollyann Samples M.Div., [facility]

## 2022-07-23 NOTE — Assessment & Plan Note (Signed)
Meets criteria with BMI greater than 35 and comorbidity of atrial fibrillation

## 2022-07-23 NOTE — Plan of Care (Signed)
  Problem: Clinical Measurements: Goal: Diagnostic test results will improve Outcome: Progressing   Problem: Respiratory: Goal: Ability to maintain adequate ventilation will improve Outcome: Progressing   Problem: Education: Goal: Knowledge of General Education information will improve Description: Including pain rating scale, medication(s)/side effects and non-pharmacologic comfort measures Outcome: Progressing

## 2022-07-23 NOTE — Progress Notes (Signed)
Progress Note   Patient: Debra [name] AUQ:[id number] DOB: Nov 27, 1955 DOA: 07/21/2022     2 DOS: the patient was seen and examined on 07/23/2022   Brief hospital course: 66 y.o. female with medical history significant for COPD with chronic respiratory failure on home O2 at 2-3 L/min home O2, anxiety and depression, invasive carcinoma of breast s/p lumpectomy and lymph node biopsy s/p radiation and neoadjuvant chemotherapy on Kadcyla and tamoxifen, atrial fibrillation on Eliquis, frequent UTIs including ESBL E. coli most recently in June 2023, who was brought to the ED on 07/21/2022 for evaluation of confusion x1 week, in addition to productive cough.  Patient admitted for severe sepsis secondary to multifocal pneumonia and possible UTI.  She was started on aggressive fluid resuscitation and antibiotics.  Over the next few days, patient's mentation has improved.  Assessment and Plan: * Severe sepsis (HCC)-resolved as of 07/23/2022 Due to multifocal pneumonia.  Pt met severe sepsis criteria on admission with intermittent tachypnea, leukocytosis, hypotension, acute encephalopathy, and AKI.  Sepsis itself has resolved.  Urine cultures with no growth. Right middle & lower lobe PNA on CXR. Covid-19 Negative.  Change meropenem to p.o. Augmentin.   AKI (acute kidney injury) (HCC)-resolved as of 07/23/2022 Secondary to sepsis, resolved with IV fluids  Acute metabolic encephalopathy POA, due to sepsis/infections.  Mental status at baseline significantly improved.  Oriented x 3 and according to husband almost 90% back to normal today.  CT of head unremarkable.  Morbid obesity ([place]) Meets criteria with BMI greater than 35 and comorbidity of atrial fibrillation  Hyponatremia Na 129 on admission.  Likely hypovolemic. Na normalized to 135 with IV fluids. --Monitor BMP  History of recurrent UTIs Pt has history frequent UTIs including ESBL E. coli most recently in June 2023.  Reported dysuria on admission.   UA showed trace leukocytes, few bacteria, 6-10 wbc's. --Follow urine culture --Continue antibiotics for pneumonia   Urinary tract infection Hx of recurrent UTI including ESBL.  Urine cultures here are unequivocal.  Multifocal pneumonia CXR on admission with new airspace disease involving right middle and lower lobes, with small right pleural effusion. --Continue meropenem --Mucolytics, bronchodilators --Culture sputum for culture if productive cough --Monitor oxygenation  Chronic anticoagulation On Eliquis for A-fib  Hypokalemia K 3.1 this AM.  Replacement ordered. Monitor BMP, replace K as needed  Normocytic anemia Overall stable. Monitor CBC.  Chronic respiratory failure with hypoxia (HCC) On 2-3 L/min home oxygen, due to COPD. O2 requirements have been stable at baseline despite pneumonia.  Monitor closely.  Invasive carcinoma of breast ([place]) s/p lumpectomy and lymph node biopsy s/p radiation and neoadjuvant chemotherapy on Kadcyla and tamoxifen Followed by Dr Talbert Cage  Atrial fibrillation [facility]) Chronic anticoagulation Currently rate controlled Continue apixaban  Insomnia Resume melatonin at bedtime  Anxiety and depression Continue home Cymbalta, Celexa  COPD with chronic bronchitis ([place]) Not acutely exacerbated, no wheezing on exam. Continue home inhalers Duonebs prn  Hyperlipidemia On Lipitor         Subjective: Patient doing okay, no complaints  Physical Exam: Vitals:   07/22/22 2030 07/23/22 0507 07/23/22 1147 07/23/22 1726  BP: 118/65 129/72 119/66 (!) 145/77  Pulse: 84 84 84 81  Resp:  _0 Temp: 98 F (36.7 C) 98.1 F (36.7 C) 98 F (36.7 C) 98.2 F (36.8 C)  TempSrc:  Oral  Oral  SpO2: 97% 97% 100% 100%  Weight:      Height:       General exam: Alert  and oriented x 3, no acute distress HEENT: Normocephalic, atraumatic, mucous membranes are moist Respiratory system: Decreased breath sounds throughout secondary to body  habitus Cardiovascular system: Regular rate and rhythm, S1-S2 Gastrointestinal system: Soft, nontender, nondistended, positive bowel sounds Central nervous system: No focal deficits Extremities: No clubbing or cyanosis or edema Skin: No skin breaks, tears or lesions Psychiatry: Appropriate, no evidence of psychoses  Labs:  Notable labs --  hemoglobin up to 8.9  Family Communication: husband at beside   Disposition: Status is: Inpatient Anticipated discharge 12/7 Remains inpatient appropriate because:  Evaluation by physical therapy   Planned Discharge Destination: Home      Author: Annita Brod, MD 07/23/2022 5:48 PM  For on call review [email or web address].

## 2022-07-24 DIAGNOSIS — F32A Depression, unspecified: Secondary | ICD-10-CM

## 2022-07-24 DIAGNOSIS — F419 Anxiety disorder, unspecified: Secondary | ICD-10-CM

## 2022-07-24 DIAGNOSIS — A419 Sepsis, unspecified organism: Secondary | ICD-10-CM | POA: Diagnosis not present

## 2022-07-24 DIAGNOSIS — G9341 Metabolic encephalopathy: Secondary | ICD-10-CM | POA: Diagnosis not present

## 2022-07-24 DIAGNOSIS — N179 Acute kidney failure, unspecified: Secondary | ICD-10-CM | POA: Diagnosis not present

## 2022-07-24 MED ORDER — AMOXICILLIN-POT CLAVULANATE 875-125 MG PO TABS: 1.0000 | ORAL_TABLET | Freq: Two times a day (BID) | ORAL | 0 refills | Status: AC

## 2022-07-24 NOTE — Discharge Summary (Signed)
Physician Discharge Summary   Patient: Debra [name] MRN: [id number] DOB: Oct 11, 1955  Admit date:     07/21/2022  Discharge date: 07/24/22  Discharge Physician: Annita Brod   PCP: [name], MD   Recommendations at discharge:   New medication: Augmentin 875 mg p.o. twice daily x 4 more doses Patient will follow-up with urology in 1 week Patient will follow-up with general surgery in the next 1 month  Discharge Diagnoses: Active Problems:   Hyperlipidemia   COPD with chronic bronchitis (HCC)   Anxiety and depression   Insomnia   Atrial fibrillation (HCC)   Invasive carcinoma of breast (HCC)   Chronic respiratory failure with hypoxia (HCC)   Normocytic anemia   Hypokalemia   Chronic anticoagulation   Multifocal pneumonia   Urinary tract infection   History of recurrent UTIs   Morbid obesity (HCC)  Principal Problem (Resolved):   Severe sepsis (HCC) Resolved Problems:   AKI (acute kidney injury) ([place])   Acute metabolic encephalopathy   Hyponatremia  Hospital Course: 66 y.o. female with medical history significant for COPD with chronic respiratory failure on home O2 at 2-3 L/min home O2, anxiety and depression, invasive carcinoma of breast s/p lumpectomy and lymph node biopsy s/p radiation and neoadjuvant chemotherapy on Kadcyla and tamoxifen, atrial fibrillation on Eliquis, frequent UTIs including ESBL E. coli most recently in June 2023, who was brought to the ED on 07/21/2022 for evaluation of confusion x1 week, in addition to productive cough.  Patient admitted for severe sepsis secondary to multifocal pneumonia and possible UTI.  She was started on aggressive fluid resuscitation and antibiotics.  Over the next few days, patient's mentation has improved.  Assessment and Plan: * Severe sepsis (HCC)-resolved as of 07/23/2022 Due to multifocal pneumonia.  Pt met severe sepsis criteria on admission with intermittent tachypnea, leukocytosis, hypotension, acute  encephalopathy, and AKI.  Sepsis itself has resolved.  Urine cultures with no growth. Right middle & lower lobe PNA on CXR. Covid-19 Negative.  Change meropenem to p.o. Augmentin and discharged with 4 doses remaining for total of 5 days of therapy.   AKI (acute kidney injury) (HCC)-resolved as of 07/23/2022 Secondary to sepsis, resolved with IV fluids  Acute metabolic encephalopathy-resolved as of 07/24/2022 POA, due to sepsis/infections.  Mental status at baseline significantly improved.  By 12/7, fully back to normal mentation.  CT of head unremarkable.  Morbid obesity ([place]) Meets criteria with BMI greater than 35 and comorbidity of atrial fibrillation  History of recurrent UTIs Pt has history frequent UTIs including ESBL E. coli most recently in June 2023.  Reported dysuria on admission.  UA showed trace leukocytes, few bacteria, 6-10 wbc's. --Follow urine culture --Continue antibiotics for pneumonia   Urinary tract infection Hx of recurrent UTI including ESBL.  Urine cultures here are unequivocal.  Multifocal pneumonia CXR on admission with new airspace disease involving right middle and lower lobes, with small right pleural effusion. --Continue meropenem --Mucolytics, bronchodilators --Culture sputum for culture if productive cough --Monitor oxygenation  Chronic anticoagulation On Eliquis for A-fib  Hypokalemia K 3.1 this AM.  Replacement ordered. Monitor BMP, replace K as needed  Normocytic anemia Overall stable. Monitor CBC.  Chronic respiratory failure with hypoxia (HCC) On 2-3 L/min home oxygen, due to COPD. O2 requirements have been stable at baseline despite pneumonia.  Monitor closely.  Invasive carcinoma of breast [facility]) s/p lumpectomy and lymph node biopsy s/p radiation and neoadjuvant chemotherapy on Kadcyla and tamoxifen Followed by Dr Talbert Cage  Atrial fibrillation (  HCC) Chronic anticoagulation Currently rate controlled Continue apixaban  Insomnia Resume  melatonin at bedtime  Anxiety and depression Continue home Cymbalta, Celexa  COPD with chronic bronchitis (HCC) Not acutely exacerbated, no wheezing on exam. Continue home inhalers Duonebs prn  Hyperlipidemia On Lipitor   Hyponatremia-resolved as of 07/24/2022 Na 129 on admission.  Likely hypovolemic. Na normalized to 135 with IV fluids. --Monitor BMP        Pain control - [employer] Controlled Substance Reporting System database was reviewed. and patient was instructed, not to drive, operate heavy machinery, perform activities at heights, swimming or participation in water activities or provide baby-sitting services while on Pain, Sleep and Anxiety Medications; until their outpatient Physician has advised to do so again. Also recommended to not to take more than prescribed Pain, Sleep and Anxiety Medications.  Consultants: None Procedures performed: None Disposition: Home Diet recommendation:  Discharge Diet Orders (From admission, onward)     Start     Ordered   07/24/22 0000  Diet - low sodium heart healthy        07/24/22 1149           Cardiac diet DISCHARGE MEDICATION: Allergies as of 07/24/2022       Reactions   Cephalexin Hives   Strawberry Extract Hives        Medication List     TAKE these medications    acetaminophen 500 MG tablet Commonly known as: TYLENOL Take 1,500 mg by mouth 2 (two) times daily as needed for moderate pain.   acidophilus Caps capsule Take 1 capsule by mouth daily.   albuterol (2.5 MG/3ML) 0.083% nebulizer solution Commonly known as: PROVENTIL Take 2.5 mg by nebulization every 4 (four) hours as needed for wheezing or shortness of breath.   albuterol 108 (90 Base) MCG/ACT inhaler Commonly known as: VENTOLIN HFA Inhale 2 puffs into the lungs every 6 (six) hours as needed for shortness of breath or wheezing.   alendronate 70 MG tablet Commonly known as: FOSAMAX Take 70 mg by mouth every Sunday.    amoxicillin-clavulanate 875-125 MG tablet Commonly known as: AUGMENTIN Take 1 tablet by mouth every 12 (twelve) hours for 2 days.   apixaban 5 MG Tabs tablet Commonly known as: ELIQUIS Take 1 tablet (5 mg total) by mouth 2 (two) times daily.   atorvastatin 20 MG tablet Commonly known as: LIPITOR Take 1 tablet (20 mg total) by mouth daily.   B-12 1000 MCG Caps Take 1,000 mcg by mouth daily.   baclofen 10 MG tablet Commonly known as: LIORESAL Take 10 mg by mouth 3 (three) times daily.   citalopram 10 MG tablet Commonly known as: CELEXA Take 10 mg by mouth daily.   DULoxetine 30 MG capsule Commonly known as: CYMBALTA Take 30 mg by mouth daily.   ferrous sulfate 325 (65 FE) MG tablet Take 325 mg by mouth daily with breakfast.   furosemide 20 MG tablet Commonly known as: LASIX Take 1 tablet (20 mg total) by mouth daily as needed for edema.   GaviLAX 17 GM/SCOOP powder Generic drug: polyethylene glycol powder SMARTSIG:17 Gram(s) By Mouth Daily PRN   lidocaine-prilocaine cream Commonly known as: EMLA APPLY 1 APPLICATION TOPICALLY AS NEEDED.   loperamide 2 MG capsule Commonly known as: IMODIUM Take 1 capsule (2 mg total) by mouth See admin instructions. Take 67m at the onset of diarrhea, then repeat 276mafter every loose bowel movement or every 2 hours. Maximum dose 1658mer 24 hours.   Melatonin 5  MG Caps Take 15 mg by mouth at bedtime.   naloxone 4 MG/0.1ML Liqd nasal spray kit Commonly known as: NARCAN Place 1 spray into the nose as needed (opioid overdose).   omeprazole 20 MG capsule Commonly known as: PRILOSEC TAKE 1 CAPSULE BY MOUTH EVERY DAY   oxyCODONE 5 MG immediate release tablet Commonly known as: Oxy IR/ROXICODONE Take by mouth. Take 1 and 1/2 tablets by mouth 5 times daily as needed for pain.   potassium chloride SA 20 MEQ tablet Commonly known as: Klor-Con M20 Take 1 tablet (20 mEq total) by mouth daily.   pregabalin 150 MG capsule Commonly  known as: LYRICA Take 1 capsule (150 mg total) by mouth 2 (two) times daily.   roflumilast 500 MCG Tabs tablet Commonly known as: DALIRESP Take 1 tablet by mouth daily.   silver sulfADIAZINE 1 % cream Commonly known as: SILVADENE Apply 1 application topically 2 (two) times daily.   tiotropium 18 MCG inhalation capsule Commonly known as: SPIRIVA Place 18 mcg into inhaler and inhale daily.   tiZANidine 4 MG tablet Commonly known as: ZANAFLEX Take 4 mg by mouth every 8 (eight) hours as needed for muscle spasms.   Trelegy Ellipta 100-62.5-25 MCG/ACT Aepb Generic drug: Fluticasone-Umeclidin-Vilant Inhale 1 puff into the lungs daily.        Discharge Exam: Danley Danker Weights   07/21/22 2010 07/22/22 2000  Weight: 78.5 kg 96.3 kg   General: Alert and oriented x 3, no acute distress Cardiovascular: Irregular rhythm, rate controlled Lungs: Clear to auscultation bilaterally  Condition at discharge: good  The results of significant diagnostics from this hospitalization (including imaging, microbiology, ancillary and laboratory) are listed below for reference.   Imaging Studies: CT Head Wo Contrast  Result Date: 07/21/2022 CLINICAL DATA:  Mental status change EXAM: CT HEAD WITHOUT CONTRAST TECHNIQUE: Contiguous axial images were obtained from the base of the skull through the vertex without intravenous contrast. RADIATION DOSE REDUCTION: This exam was performed according to the departmental dose-optimization program which includes automated exposure control, adjustment of the mA and/or kV according to patient size and/or use of iterative reconstruction technique. COMPARISON:  Head CT 11/30/2014 FINDINGS: Brain: No evidence of acute infarction, hemorrhage, hydrocephalus, extra-axial collection or mass lesion/mass effect. Vascular: No hyperdense vessel or unexpected calcification. Skull: Normal. Negative for fracture or focal lesion. Sinuses/Orbits: There is opacification of right mastoid air  cells. There are no air-fluid levels in the paranasal sinuses. Orbits are within normal limits. Other: None. IMPRESSION: 1. No acute intracranial process. 2. Opacification of right mastoid air cells. Electronically Signed   By: Ronney Asters M.D.   On: 07/21/2022 20:40   DG Chest Port 1 View  Result Date: 07/21/2022 CLINICAL DATA:  Confusion and hallucinations EXAM: PORTABLE CHEST 1 VIEW COMPARISON:  Chest x-ray 02/15/2021 FINDINGS: Left chest port catheter tip projects over the SVC. There is new right mid and lower lung airspace disease worrisome for pneumonia. There is likely a small right pleural effusion. Left lung is clear. There is no pneumothorax. The cardiomediastinal silhouette is within normal limits. No acute fractures are seen. IMPRESSION: 2. New right mid and lower lung airspace disease worrisome for pneumonia. Recommend follow-up PA and lateral chest x-ray in 4-6 weeks to confirm resolution. 2.  Small right pleural effusion. Electronically Signed   By: Ronney Asters M.D.   On: 07/21/2022 18:04    Microbiology: Results for orders placed or performed during the hospital encounter of 07/21/22  Culture, blood (routine x 2)  Status: None (Preliminary result)   Collection Time: 07/21/22  5:22 PM   Specimen: BLOOD  Result Value Ref Range Status   Specimen Description BLOOD RIGHT ANTECUBITAL  Final   Special Requests   Final    BOTTLES DRAWN AEROBIC AND ANAEROBIC Blood Culture results may not be optimal due to an excessive volume of blood received in culture bottles   Culture   Final    NO GROWTH 3 DAYS Performed at [facility], [address], [place], [place] [address]    Report Status PENDING  Incomplete  Culture, blood (routine x 2)     Status: None (Preliminary result)   Collection Time: 07/21/22  5:27 PM   Specimen: BLOOD  Result Value Ref Range Status   Specimen Description BLOOD BLOOD LEFT FOREARM  Final   Special Requests   Final    BOTTLES DRAWN AEROBIC AND  ANAEROBIC Blood Culture results may not be optimal due to an excessive volume of blood received in culture bottles   Culture   Final    NO GROWTH 3 DAYS Performed at [facility], [address] [place], [place] [address]    Report Status PENDING  Incomplete  Urine Culture     Status: None   Collection Time: 07/21/22  7:19 PM   Specimen: Urine, Clean Catch  Result Value Ref Range Status   Specimen Description   Final    URINE, CLEAN CATCH Performed at [facility], [address] [place], [place] [address]    Special Requests   Final    NONE Performed at [facility], [address] [place], [place] [address]    Culture   Final    NO GROWTH Performed at [facility], [place] [address], [place], [place] [address]    Report Status 07/23/2022 FINAL  Final  SARS Coronavirus 2 by RT PCR (hospital order, performed in [facility] hospital lab) *cepheid single result test* Anterior Nasal Swab     Status: None   Collection Time: 07/21/22 11:34 PM   Specimen: Anterior Nasal Swab  Result Value Ref Range Status   SARS Coronavirus 2 by RT PCR NEGATIVE NEGATIVE Final    Comment: (NOTE) SARS-CoV-2 target nucleic acids are NOT DETECTED.  The SARS-CoV-2 RNA is generally detectable in upper and lower respiratory specimens during the acute phase of infection. The lowest concentration of SARS-CoV-2 viral copies this assay can detect is 250 copies / mL. A negative result does not preclude SARS-CoV-2 infection and should not be used as the sole basis for treatment or other patient management decisions.  A negative result may occur with improper specimen collection / handling, submission of specimen other than nasopharyngeal swab, presence of viral mutation(s) within the areas targeted by this assay, and inadequate number of viral copies (<250 copies / mL). A negative result must be combined with clinical observations, patient history, and epidemiological  information.  Fact Sheet for Patients:   https://www.patel.info/  Fact Sheet for Healthcare Providers: https://hall.com/  This test is not yet approved or  cleared by the [place] FDA and has been authorized for detection and/or diagnosis of SARS-CoV-2 by FDA under an Emergency Use Authorization (EUA).  This EUA will remain in effect (meaning this test can be used) for the duration of the COVID-19 declaration under Section 564(b)(1) of the Act, 21 U.S.C. section 360bbb-3(b)(1), unless the authorization is terminated or revoked sooner.  Performed at [facility], [place], [place]  [address]   MRSA Next Gen by PCR, Nasal     Status: None   Collection Time: 07/22/22 10:55 PM   Specimen: Nasal Mucosa; Nasal Swab  Result Value Ref Range Status   MRSA by PCR Next Gen NOT DETECTED NOT DETECTED Final    Comment: (NOTE) The GeneXpert MRSA Assay (FDA approved for NASAL specimens only), is one component of a comprehensive MRSA colonization surveillance program. It is not intended to diagnose MRSA infection nor to guide or monitor treatment for MRSA infections. Test performance is not FDA approved in patients less than 40 years old. Performed at [facility], [address] [place], [place] [address]     Labs: CBC: Recent Labs  Lab 07/21/22 1719 07/22/22 0544 07/23/22 0506  WBC 23.0* 13.3* 10.2  NEUTROABS 19.4*  --   --   HGB 10.0* 8.2* 8.9*  HCT 31.5* 25.4* 28.4*  MCV 90.0 88.8 89.9  PLT 298 202 863   Basic Metabolic Panel: Recent Labs  Lab 07/21/22 1719 07/22/22 0544 07/23/22 0506  NA 129* 135 141  K 3.0* 3.1* 3.8  CL 90* 100 107  CO2 _0 GLUCOSE 113* 95 110*  BUN _1 CREATININE 1.65* 0.97 0.72  CALCIUM 8.8* 8.0* 8.8*  MG  --   --  1.5*   Liver Function Tests: Recent Labs  Lab 07/21/22 1804  AST 21  ALT 14  ALKPHOS 73  BILITOT 1.0  PROT 6.3*  ALBUMIN 2.0*    CBG: No results for input(s): "GLUCAP" in the last 168 hours.  Discharge time spent: less than 30 minutes.  Signed: Annita Brod, MD [facility] 07/24/2022
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
## 2022-07-24 NOTE — Progress Notes (Signed)
Patient discharged to home. All discharge instructions provided and questions answered. All belongings sent with patient and IVs removed.

## 2022-07-24 NOTE — Care Management Important Message (Signed)
Important Message  Patient Details  Name: JULIAUNA STUEVE MRN: [id number] Date of Birth: [birth year]   Medicare Important Message Given:  Yes  Reviewed Medicare IM with patient via room phone [phone] due to isolation status. Confirmed she has copy of Medicare IM to reference, declined another at this time.     Dannette Barbara 07/24/2022, 12:02 PM

## 2022-07-25 LAB — BLOOD GAS, VENOUS
Acid-Base Excess: 3.8 mmol/L — ABNORMAL HIGH (ref 0.0–2.0)
Bicarbonate: 30.9 mmol/L — ABNORMAL HIGH (ref 20.0–28.0)
O2 Saturation: 65.5 %
Patient temperature: 37
pCO2, Ven: 56 mmHg (ref 44–60)
pH, Ven: 7.35 (ref 7.25–7.43)
pO2, Ven: 43 mmHg (ref 32–45)

## 2022-07-26 LAB — CULTURE, BLOOD (ROUTINE X 2)
Culture: NO GROWTH
Culture: NO GROWTH

## 2022-07-31 ENCOUNTER — Other Ambulatory Visit: Payer: Self-pay | Admitting: Oncology

## 2022-07-31 NOTE — Telephone Encounter (Signed)
Component Ref Range & Units 8 d ago (07/23/22) 9 d ago (07/22/22) 10 d ago (07/21/22) 1 mo ago (06/27/22) 1 mo ago (06/06/22) 2 mo ago (05/16/22) 3 mo ago (04/25/22)  Potassium 3.5 - 5.1 mmol/L 3.8 3.1 Low  3.0 Low  3.7 3.8 3.7 4.2

## 2022-08-01 ENCOUNTER — Encounter: Payer: Self-pay | Admitting: Cardiology

## 2022-08-01 ENCOUNTER — Inpatient Hospital Stay: Payer: Medicare PPO

## 2022-08-01 ENCOUNTER — Encounter: Payer: Self-pay | Admitting: Oncology

## 2022-08-01 ENCOUNTER — Inpatient Hospital Stay (HOSPITAL_BASED_OUTPATIENT_CLINIC_OR_DEPARTMENT_OTHER): Payer: Medicare PPO | Admitting: Oncology

## 2022-08-01 ENCOUNTER — Other Ambulatory Visit
Admission: RE | Admit: 2022-08-01 | Discharge: 2022-08-01 | Disposition: A | Discharge status: Home / Self Care | Payer: Medicare PPO | Source: Ambulatory Visit | Attending: Cardiology | Admitting: Cardiology

## 2022-08-01 ENCOUNTER — Ambulatory Visit: Payer: Medicare PPO | Attending: Cardiology | Admitting: Cardiology

## 2022-08-01 ENCOUNTER — Inpatient Hospital Stay: Payer: Medicare PPO | Attending: Oncology

## 2022-08-01 VITALS — BP 133/77 | HR 92 | Temp 97.8°F | Resp 18 | Wt 171.8 lb

## 2022-08-01 VITALS — BP 136/70 | HR 89 | Ht 65.0 in | Wt 174.6 lb

## 2022-08-01 DIAGNOSIS — Z170 Estrogen receptor positive status [ER+]: Secondary | ICD-10-CM | POA: Insufficient documentation

## 2022-08-01 DIAGNOSIS — C50919 Malignant neoplasm of unspecified site of unspecified female breast: Secondary | ICD-10-CM

## 2022-08-01 DIAGNOSIS — Z5112 Encounter for antineoplastic immunotherapy: Secondary | ICD-10-CM | POA: Diagnosis present

## 2022-08-01 DIAGNOSIS — D6481 Anemia due to antineoplastic chemotherapy: Secondary | ICD-10-CM | POA: Diagnosis not present

## 2022-08-01 DIAGNOSIS — C50211 Malignant neoplasm of upper-inner quadrant of right female breast: Secondary | ICD-10-CM | POA: Insufficient documentation

## 2022-08-01 DIAGNOSIS — Z923 Personal history of irradiation: Secondary | ICD-10-CM | POA: Diagnosis not present

## 2022-08-01 DIAGNOSIS — E876 Hypokalemia: Secondary | ICD-10-CM

## 2022-08-01 DIAGNOSIS — M8580 Other specified disorders of bone density and structure, unspecified site: Secondary | ICD-10-CM | POA: Diagnosis not present

## 2022-08-01 DIAGNOSIS — Z79810 Long term (current) use of selective estrogen receptor modulators (SERMs): Secondary | ICD-10-CM | POA: Insufficient documentation

## 2022-08-01 DIAGNOSIS — E7800 Pure hypercholesterolemia, unspecified: Secondary | ICD-10-CM | POA: Diagnosis not present

## 2022-08-01 DIAGNOSIS — Z5111 Encounter for antineoplastic chemotherapy: Secondary | ICD-10-CM | POA: Diagnosis not present

## 2022-08-01 DIAGNOSIS — I480 Paroxysmal atrial fibrillation: Secondary | ICD-10-CM | POA: Diagnosis not present

## 2022-08-01 DIAGNOSIS — I4891 Unspecified atrial fibrillation: Secondary | ICD-10-CM | POA: Diagnosis not present

## 2022-08-01 DIAGNOSIS — T451X5A Adverse effect of antineoplastic and immunosuppressive drugs, initial encounter: Secondary | ICD-10-CM | POA: Insufficient documentation

## 2022-08-01 DIAGNOSIS — Z87891 Personal history of nicotine dependence: Secondary | ICD-10-CM | POA: Diagnosis not present

## 2022-08-01 DIAGNOSIS — Z7901 Long term (current) use of anticoagulants: Secondary | ICD-10-CM | POA: Diagnosis not present

## 2022-08-01 DIAGNOSIS — D649 Anemia, unspecified: Secondary | ICD-10-CM | POA: Diagnosis not present

## 2022-08-01 DIAGNOSIS — Z79899 Other long term (current) drug therapy: Secondary | ICD-10-CM | POA: Diagnosis not present

## 2022-08-01 LAB — CBC WITH DIFFERENTIAL/PLATELET
Abs Immature Granulocytes: 0.02 10*3/uL (ref 0.00–0.07)
Basophils Absolute: 0.1 10*3/uL (ref 0.0–0.1)
Basophils Relative: 1 %
Eosinophils Absolute: 0.2 10*3/uL (ref 0.0–0.5)
Eosinophils Relative: 2 %
HCT: 29.9 % — ABNORMAL LOW (ref 36.0–46.0)
Hemoglobin: 9.4 g/dL — ABNORMAL LOW (ref 12.0–15.0)
Immature Granulocytes: 0 %
Lymphocytes Relative: 34 %
Lymphs Abs: 2.6 10*3/uL (ref 0.7–4.0)
MCH: 28.6 pg (ref 26.0–34.0)
MCHC: 31.4 g/dL (ref 30.0–36.0)
MCV: 90.9 fL (ref 80.0–100.0)
Monocytes Absolute: 0.5 10*3/uL (ref 0.1–1.0)
Monocytes Relative: 7 %
Neutro Abs: 4.3 10*3/uL (ref 1.7–7.7)
Neutrophils Relative %: 56 %
Platelets: 380 10*3/uL (ref 150–400)
RBC: 3.29 MIL/uL — ABNORMAL LOW (ref 3.87–5.11)
RDW: 14.9 % (ref 11.5–15.5)
WBC: 7.6 10*3/uL (ref 4.0–10.5)
nRBC: 0 % (ref 0.0–0.2)

## 2022-08-01 LAB — COMPREHENSIVE METABOLIC PANEL
ALT: 18 U/L (ref 0–44)
AST: 28 U/L (ref 15–41)
Albumin: 2.8 g/dL — ABNORMAL LOW (ref 3.5–5.0)
Alkaline Phosphatase: 59 U/L (ref 38–126)
Anion gap: 8 (ref 5–15)
BUN: 13 mg/dL (ref 8–23)
CO2: 29 mmol/L (ref 22–32)
Calcium: 8.6 mg/dL — ABNORMAL LOW (ref 8.9–10.3)
Chloride: 101 mmol/L (ref 98–111)
Creatinine, Ser: 0.8 mg/dL (ref 0.44–1.00)
GFR, Estimated: >60 mL/min (ref >60)
Glucose, Bld: 115 mg/dL — ABNORMAL HIGH (ref 70–99)
Potassium: 3.8 mmol/L (ref 3.5–5.1)
Sodium: 138 mmol/L (ref 135–145)
Total Bilirubin: 0.3 mg/dL (ref 0.3–1.2)
Total Protein: 7.5 g/dL (ref 6.5–8.1)

## 2022-08-01 LAB — BASIC METABOLIC PANEL
Anion gap: 7 (ref 5–15)
BUN: 12 mg/dL (ref 8–23)
CO2: 28 mmol/L (ref 22–32)
Calcium: 8.5 mg/dL — ABNORMAL LOW (ref 8.9–10.3)
Chloride: 100 mmol/L (ref 98–111)
Creatinine, Ser: 0.78 mg/dL (ref 0.44–1.00)
GFR, Estimated: >60 mL/min (ref >60)
Glucose, Bld: 105 mg/dL — ABNORMAL HIGH (ref 70–99)
Potassium: 3.9 mmol/L (ref 3.5–5.1)
Sodium: 135 mmol/L (ref 135–145)

## 2022-08-01 MED ORDER — DIPHENHYDRAMINE HCL 25 MG PO CAPS
50.0000 mg | ORAL_CAPSULE | Freq: Once | ORAL | Status: AC
  Administered 2022-08-01: 50 mg via ORAL
  Filled 2022-08-01: qty 2

## 2022-08-01 MED ORDER — PROCHLORPERAZINE MALEATE 10 MG PO TABS
10.0000 mg | ORAL_TABLET | Freq: Once | ORAL | Status: AC
  Administered 2022-08-01: 10 mg via ORAL
  Filled 2022-08-01: qty 1

## 2022-08-01 MED ORDER — ACETAMINOPHEN 325 MG PO TABS
650.0000 mg | ORAL_TABLET | Freq: Once | ORAL | Status: AC
  Administered 2022-08-01: 650 mg via ORAL
  Filled 2022-08-01: qty 2

## 2022-08-01 MED ORDER — DILTIAZEM HCL ER COATED BEADS 120 MG PO CP24: 120.0000 mg | ORAL_CAPSULE | Freq: Every day | ORAL | 3 refills | Status: DC

## 2022-08-01 MED ORDER — HEPARIN SOD (PORK) LOCK FLUSH 100 UNIT/ML IV SOLN
500.0000 [IU] | Freq: Once | INTRAVENOUS | Status: AC | PRN
  Administered 2022-08-01: 500 [IU]
  Filled 2022-08-01: qty 5

## 2022-08-01 MED ORDER — SODIUM CHLORIDE 0.9 % IV SOLN
Freq: Once | INTRAVENOUS | Status: AC
  Filled 2022-08-01: qty 250

## 2022-08-01 MED ORDER — SODIUM CHLORIDE 0.9 % IV SOLN
3.6000 mg/kg | Freq: Once | INTRAVENOUS | Status: AC
  Administered 2022-08-01: 260 mg via INTRAVENOUS
  Filled 2022-08-01: qty 8

## 2022-08-01 MED ORDER — SODIUM CHLORIDE 0.9% FLUSH
10.0000 mL | Freq: Once | INTRAVENOUS | Status: AC
  Administered 2022-08-01: 10 mL via INTRAVENOUS
  Filled 2022-08-01: qty 10

## 2022-08-01 MED ORDER — HEPARIN SOD (PORK) LOCK FLUSH 100 UNIT/ML IV SOLN
500.0000 [IU] | Freq: Once | INTRAVENOUS | Status: DC
  Filled 2022-08-01: qty 5

## 2022-08-01 NOTE — Assessment & Plan Note (Signed)
Chemotherapy plan as above 

## 2022-08-01 NOTE — Assessment & Plan Note (Signed)
Recommend calcium 1200 mg  and vitamin D  daily supplementation.

## 2022-08-01 NOTE — Assessment & Plan Note (Signed)
Right breast cT1c cN0 invasive carcinoma, ER/PR positive, HER2 positive tumor size appears larger, 2.5cm,  T2  on MRI,  Baseline normal CA 27-29, CEA 15-3 S/p TCH x2, and TCHP x4 followed by lumpectomy and sentinel lymph node biopsy. ypT1a ypN0- Residual disease after neoadjuvant chemotherapy, status post adjuvant radiation Labs reviewed and discussed with patient. Proceed with Kadcyla today.  Continue tamoxifen 20 mg daily.-Patient is on Eliquis for atrial fibrillation. Recommend gynecology evaluation annually-refer to gynecology 
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
## 2022-08-01 NOTE — Assessment & Plan Note (Signed)
Continue potassium supplementation.  20 mEq daily

## 2022-08-01 NOTE — Assessment & Plan Note (Signed)
Due to chronic disease and previous chemotherapy. Hemoglobin stable.  Monitor
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
## 2022-08-01 NOTE — Progress Notes (Signed)
Pt here for follow. Reports she was treated with antibiotics for Pna last week. She is feeling better today and antibiotic course has been completed.

## 2022-08-01 NOTE — Patient Instructions (Signed)
Medication Instructions:   START Diltiazem (Cardizem) - take one tablet ('120mg'$ ) by mouth daily.   *If you need a refill on your cardiac medications before your next appointment, please call your pharmacy*   Lab Work:  Your physician recommends you have blood work completed at the medical mall - [facility]   If you have labs (blood work) drawn today and your tests are completely normal, you will receive your results only by: MyChart Message (if you have MyChart) OR A paper copy in the mail If you have any lab test that is abnormal or we need to change your treatment, we will call you to review the results.   Testing/Procedures:  None Ordered   Follow-Up: At [facility], you and your health needs are our priority.  As part of our continuing mission to provide you with exceptional heart care, we have created designated Provider Care Teams.  These Care Teams include your primary Cardiologist (physician) and Advanced Practice Providers (APPs -  Physician Assistants and Nurse Practitioners) who all work together to provide you with the care you need, when you need it.  We recommend signing up for the patient portal called "MyChart".  Sign up information is provided on this After Visit Summary.  MyChart is used to connect with patients for Virtual Visits (Telemedicine).  Patients are able to view lab/test results, encounter notes, upcoming appointments, etc.  Non-urgent messages can be sent to your provider as well.   To learn more about what you can do with MyChart, go to [email or web address].    Your next appointment:   3 month(s)  The format for your next appointment:   In Person  Provider:   You may see [name], MD or one of the following Advanced Practice Providers on your designated Care Team:   Murray Hodgkins, NP Christell Faith, PA-C Cadence Kathlen Mody, PA-C Gerrie Nordmann, NP

## 2022-08-08 ENCOUNTER — Ambulatory Visit: Payer: Medicare PPO | Admitting: Oncology

## 2022-08-08 ENCOUNTER — Ambulatory Visit: Payer: Medicare PPO

## 2022-08-08 ENCOUNTER — Other Ambulatory Visit: Payer: Medicare PPO

## 2022-08-22 ENCOUNTER — Encounter: Payer: Self-pay | Admitting: Oncology

## 2022-08-22 ENCOUNTER — Inpatient Hospital Stay: Payer: Medicare Other | Attending: Oncology

## 2022-08-22 ENCOUNTER — Inpatient Hospital Stay (HOSPITAL_BASED_OUTPATIENT_CLINIC_OR_DEPARTMENT_OTHER): Payer: Medicare Other | Admitting: Oncology

## 2022-08-22 ENCOUNTER — Inpatient Hospital Stay: Payer: Medicare Other

## 2022-08-22 VITALS — BP 115/47 | HR 91 | Temp 97.5°F | Wt 174.9 lb

## 2022-08-22 DIAGNOSIS — C50919 Malignant neoplasm of unspecified site of unspecified female breast: Secondary | ICD-10-CM | POA: Diagnosis not present

## 2022-08-22 DIAGNOSIS — Z5112 Encounter for antineoplastic immunotherapy: Secondary | ICD-10-CM | POA: Diagnosis present

## 2022-08-22 DIAGNOSIS — C50211 Malignant neoplasm of upper-inner quadrant of right female breast: Secondary | ICD-10-CM | POA: Diagnosis present

## 2022-08-22 DIAGNOSIS — M8580 Other specified disorders of bone density and structure, unspecified site: Secondary | ICD-10-CM | POA: Diagnosis not present

## 2022-08-22 DIAGNOSIS — D649 Anemia, unspecified: Secondary | ICD-10-CM | POA: Diagnosis not present

## 2022-08-22 DIAGNOSIS — Z5111 Encounter for antineoplastic chemotherapy: Secondary | ICD-10-CM

## 2022-08-22 DIAGNOSIS — Z79899 Other long term (current) drug therapy: Secondary | ICD-10-CM | POA: Diagnosis not present

## 2022-08-22 DIAGNOSIS — E876 Hypokalemia: Secondary | ICD-10-CM

## 2022-08-22 DIAGNOSIS — Z95828 Presence of other vascular implants and grafts: Secondary | ICD-10-CM

## 2022-08-22 LAB — CBC WITH DIFFERENTIAL/PLATELET
Abs Immature Granulocytes: 0.02 10*3/uL (ref 0.00–0.07)
Basophils Absolute: 0 10*3/uL (ref 0.0–0.1)
Basophils Relative: 1 %
Eosinophils Absolute: 0.3 10*3/uL (ref 0.0–0.5)
Eosinophils Relative: 3 %
HCT: 29.5 % — ABNORMAL LOW (ref 36.0–46.0)
Hemoglobin: 9.3 g/dL — ABNORMAL LOW (ref 12.0–15.0)
Immature Granulocytes: 0 %
Lymphocytes Relative: 37 %
Lymphs Abs: 2.7 10*3/uL (ref 0.7–4.0)
MCH: 28.2 pg (ref 26.0–34.0)
MCHC: 31.5 g/dL (ref 30.0–36.0)
MCV: 89.4 fL (ref 80.0–100.0)
Monocytes Absolute: 0.5 10*3/uL (ref 0.1–1.0)
Monocytes Relative: 6 %
Neutro Abs: 3.9 10*3/uL (ref 1.7–7.7)
Neutrophils Relative %: 53 %
Platelets: 203 10*3/uL (ref 150–400)
RBC: 3.3 MIL/uL — ABNORMAL LOW (ref 3.87–5.11)
RDW: 15.7 % — ABNORMAL HIGH (ref 11.5–15.5)
WBC: 7.4 10*3/uL (ref 4.0–10.5)
nRBC: 0 % (ref 0.0–0.2)

## 2022-08-22 LAB — COMPREHENSIVE METABOLIC PANEL
ALT: 14 U/L (ref 0–44)
AST: 25 U/L (ref 15–41)
Albumin: 3 g/dL — ABNORMAL LOW (ref 3.5–5.0)
Alkaline Phosphatase: 60 U/L (ref 38–126)
Anion gap: 10 (ref 5–15)
BUN: 13 mg/dL (ref 8–23)
CO2: 29 mmol/L (ref 22–32)
Calcium: 8.8 mg/dL — ABNORMAL LOW (ref 8.9–10.3)
Chloride: 98 mmol/L (ref 98–111)
Creatinine, Ser: 0.96 mg/dL (ref 0.44–1.00)
GFR, Estimated: >60 mL/min (ref >60)
Glucose, Bld: 126 mg/dL — ABNORMAL HIGH (ref 70–99)
Potassium: 3.4 mmol/L — ABNORMAL LOW (ref 3.5–5.1)
Sodium: 137 mmol/L (ref 135–145)
Total Bilirubin: 1.3 mg/dL — ABNORMAL HIGH (ref 0.3–1.2)
Total Protein: 7.4 g/dL (ref 6.5–8.1)

## 2022-08-22 MED ORDER — SODIUM CHLORIDE 0.9% FLUSH
10.0000 mL | INTRAVENOUS | Status: DC | PRN
  Administered 2022-08-22: 10 mL
  Filled 2022-08-22: qty 10

## 2022-08-22 MED ORDER — SODIUM CHLORIDE 0.9 % IV SOLN
Freq: Once | INTRAVENOUS | Status: AC
  Filled 2022-08-22: qty 250

## 2022-08-22 MED ORDER — PROCHLORPERAZINE MALEATE 10 MG PO TABS
10.0000 mg | ORAL_TABLET | Freq: Once | ORAL | Status: AC
  Administered 2022-08-22: 10 mg via ORAL
  Filled 2022-08-22: qty 1

## 2022-08-22 MED ORDER — DIPHENHYDRAMINE HCL 25 MG PO CAPS
50.0000 mg | ORAL_CAPSULE | Freq: Once | ORAL | Status: AC
  Administered 2022-08-22: 50 mg via ORAL
  Filled 2022-08-22: qty 2

## 2022-08-22 MED ORDER — HEPARIN SOD (PORK) LOCK FLUSH 100 UNIT/ML IV SOLN
500.0000 [IU] | Freq: Once | INTRAVENOUS | Status: AC | PRN
  Administered 2022-08-22: 500 [IU]
  Filled 2022-08-22: qty 5

## 2022-08-22 MED ORDER — SODIUM CHLORIDE 0.9 % IV SOLN
3.6000 mg/kg | Freq: Once | INTRAVENOUS | Status: AC
  Administered 2022-08-22: 260 mg via INTRAVENOUS
  Filled 2022-08-22: qty 5

## 2022-08-22 MED ORDER — ACETAMINOPHEN 325 MG PO TABS
650.0000 mg | ORAL_TABLET | Freq: Once | ORAL | Status: AC
  Administered 2022-08-22: 650 mg via ORAL
  Filled 2022-08-22: qty 2

## 2022-08-22 NOTE — Assessment & Plan Note (Signed)
Chemotherapy plan as above 

## 2022-08-22 NOTE — Assessment & Plan Note (Signed)
Continue port flush q. 6 -8weeks

## 2022-08-22 NOTE — Assessment & Plan Note (Signed)
Right breast cT1c cN0 invasive carcinoma, ER/PR positive, HER2 positive tumor size appears larger, 2.5cm,  T2  on MRI,  Baseline normal CA 27-29, CEA 15-3 S/p TCH x2, and TCHP x4 followed by lumpectomy and sentinel lymph node biopsy. ypT1a ypN0- Residual disease after neoadjuvant chemotherapy, status post adjuvant radiation Labs reviewed and discussed with patient. Proceed with Kadcyla today-last dose.  Continue tamoxifen 20 mg daily.-Patient is on Eliquis for atrial fibrillation. Recommend gynecology evaluation annually-refer to gynecology

## 2022-08-22 NOTE — Patient Instructions (Signed)
[facility] CANCER CTR AT [place]  Discharge Instructions: Thank you for choosing [facility] to provide your oncology and hematology care.  If you have a lab appointment with the [facility], please go directly to the [facility] and check in at the registration area.  Wear comfortable clothing and clothing appropriate for easy access to any Portacath or PICC line.   We strive to give you quality time with your provider. You may need to reschedule your appointment if you arrive late (15 or more minutes).  Arriving late affects you and other patients whose appointments are after yours.  Also, if you miss three or more appointments without notifying the office, you may be dismissed from the clinic at the provider's discretion.      For prescription refill requests, have your pharmacy contact our office and allow 72 hours for refills to be completed.    Today you received the following chemotherapy and/or immunotherapy agents: trastuzumab      To help prevent nausea and vomiting after your treatment, we encourage you to take your nausea medication as directed.  BELOW ARE SYMPTOMS THAT SHOULD BE REPORTED IMMEDIATELY: *FEVER GREATER THAN 100.4 F (38 C) OR HIGHER *CHILLS OR SWEATING *NAUSEA AND VOMITING THAT IS NOT CONTROLLED WITH YOUR NAUSEA MEDICATION *UNUSUAL SHORTNESS OF BREATH *UNUSUAL BRUISING OR BLEEDING *URINARY PROBLEMS (pain or burning when urinating, or frequent urination) *BOWEL PROBLEMS (unusual diarrhea, constipation, pain near the anus) TENDERNESS IN MOUTH AND THROAT WITH OR WITHOUT PRESENCE OF ULCERS (sore throat, sores in mouth, or a toothache) UNUSUAL RASH, SWELLING OR PAIN  UNUSUAL VAGINAL DISCHARGE OR ITCHING   Items with * indicate a potential emergency and should be followed up as soon as possible or go to the Emergency Department if any problems should occur.  Please show the CHEMOTHERAPY ALERT CARD or IMMUNOTHERAPY ALERT CARD at check-in  to the Emergency Department and triage nurse.  Should you have questions after your visit or need to cancel or reschedule your appointment, please contact [facility] [place] AT [place]  [phone] and follow the prompts.  Office hours are 8:00 a.m. to 4:30 p.m. Monday - Friday. Please note that voicemails left after 4:00 p.m. may not be returned until the following business day.  We are closed weekends and major holidays. You have access to a nurse at all times for urgent questions. Please call the main number to the clinic [phone] and follow the prompts.  For any non-urgent questions, you may also contact your provider using MyChart. We now offer e-Visits for anyone 50 and older to request care online for non-urgent symptoms. For details visit [email or web address].   Also download the MyChart app! Go to the app store, search "MyChart", open the app, select [facility], and log in with your MyChart username and password.

## 2022-08-22 NOTE — Assessment & Plan Note (Signed)
Continue potassium supplementation.  20 mEq daily-advised patient to finish current supply.

## 2022-08-22 NOTE — Assessment & Plan Note (Signed)
Recommend calcium 1200 mg  and vitamin D  daily supplementation.

## 2022-08-22 NOTE — Assessment & Plan Note (Signed)
Due to chronic disease and previous chemotherapy. Hemoglobin stable.  Monitor
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
## 2022-08-24 ENCOUNTER — Other Ambulatory Visit: Payer: Self-pay

## 2022-08-26 ENCOUNTER — Encounter: Payer: Self-pay | Admitting: Oncology

## 2022-08-28 ENCOUNTER — Other Ambulatory Visit: Payer: Self-pay | Admitting: Oncology

## 2022-08-28 NOTE — Telephone Encounter (Signed)
Component Ref Range & Units 6 d ago (08/22/22) 3 wk ago (08/01/22) 3 wk ago (08/01/22) 1 mo ago (07/23/22) 1 mo ago (07/22/22) 1 mo ago (07/21/22) 1 mo ago (07/21/22)  Potassium 3.5 - 5.1 mmol/L 3.4 Low  3.9 3.8 3.8 3.1 Low   3.0 Low

## 2022-09-01 ENCOUNTER — Other Ambulatory Visit: Payer: Self-pay | Admitting: Oncology

## 2022-09-01 NOTE — Telephone Encounter (Signed)
Component Ref Range & Units 10 d ago (08/22/22) 1 mo ago (08/01/22) 1 mo ago (08/01/22) 1 mo ago (07/23/22) 1 mo ago (07/22/22) 1 mo ago (07/21/22) 1 mo ago (07/21/22)  Potassium 3.5 - 5.1 mmol/L 3.4 Low  3.9 3.8 3.8 3.1 Low   3.0 Low

## 2022-09-30 ENCOUNTER — Other Ambulatory Visit: Payer: Self-pay

## 2022-10-03 ENCOUNTER — Inpatient Hospital Stay: Payer: Medicare Other | Attending: Oncology

## 2022-10-03 DIAGNOSIS — Z95828 Presence of other vascular implants and grafts: Secondary | ICD-10-CM

## 2022-10-03 DIAGNOSIS — C50211 Malignant neoplasm of upper-inner quadrant of right female breast: Secondary | ICD-10-CM | POA: Insufficient documentation

## 2022-10-03 MED ORDER — HEPARIN SOD (PORK) LOCK FLUSH 100 UNIT/ML IV SOLN
500.0000 [IU] | Freq: Once | INTRAVENOUS | Status: AC
  Administered 2022-10-03: 500 [IU] via INTRAVENOUS
  Filled 2022-10-03: qty 5

## 2022-10-03 MED ORDER — SODIUM CHLORIDE 0.9% FLUSH
10.0000 mL | Freq: Once | INTRAVENOUS | Status: AC
  Administered 2022-10-03: 10 mL via INTRAVENOUS
  Filled 2022-10-03: qty 10

## 2022-10-10 ENCOUNTER — Telehealth: Payer: Self-pay | Admitting: Cardiology

## 2022-10-10 DIAGNOSIS — I480 Paroxysmal atrial fibrillation: Secondary | ICD-10-CM

## 2022-10-10 MED ORDER — APIXABAN 5 MG PO TABS: 5.0000 mg | ORAL_TABLET | Freq: Two times a day (BID) | ORAL | 1 refills | Status: DC

## 2022-10-10 NOTE — Telephone Encounter (Signed)
Prescription refill request for Eliquis received. Indication: Afib  Last office visit: 08/01/22 ([place])  Scr: 0.96 (08/22/22)  Age: 67 Weight: 79.3kg  Appropriate dose. Refill sent.

## 2022-10-10 NOTE — Telephone Encounter (Signed)
*  STAT* If patient is at the pharmacy, call can be transferred to refill team.   1. Which medications need to be refilled? (please list name of each medication and dose if known)   apixaban (ELIQUIS) 5 MG TABS tablet    2. Which pharmacy/location (including street and city if local pharmacy) is medication to be sent to? [id number]- [place], [place] - [address]   3. Do they need a 30 day or 90 day supply? [address]

## 2022-10-10 NOTE — Telephone Encounter (Signed)
Please review for refill. Thank you! 

## 2022-11-10 ENCOUNTER — Ambulatory Visit: Payer: Medicare PPO | Admitting: Radiation Oncology

## 2022-11-12 ENCOUNTER — Ambulatory Visit
Admission: RE | Admit: 2022-11-12 | Discharge: 2022-11-12 | Disposition: A | Discharge status: Home / Self Care | Payer: Medicare Other | Source: Ambulatory Visit | Attending: Radiation Oncology | Admitting: Radiation Oncology

## 2022-11-12 ENCOUNTER — Inpatient Hospital Stay: Payer: Medicare Other | Attending: Oncology

## 2022-11-12 ENCOUNTER — Encounter: Payer: Self-pay | Admitting: Radiation Oncology

## 2022-11-12 ENCOUNTER — Inpatient Hospital Stay (HOSPITAL_BASED_OUTPATIENT_CLINIC_OR_DEPARTMENT_OTHER): Payer: Medicare Other | Admitting: Oncology

## 2022-11-12 ENCOUNTER — Encounter: Payer: Self-pay | Admitting: Oncology

## 2022-11-12 VITALS — BP 94/60 | HR 58 | Temp 98.5°F | Resp 18 | Wt 178.7 lb

## 2022-11-12 VITALS — BP 124/76 | HR 99 | Temp 96.7°F | Resp 16 | Ht 65.0 in | Wt 177.3 lb

## 2022-11-12 DIAGNOSIS — Z7901 Long term (current) use of anticoagulants: Secondary | ICD-10-CM | POA: Insufficient documentation

## 2022-11-12 DIAGNOSIS — Z170 Estrogen receptor positive status [ER+]: Secondary | ICD-10-CM | POA: Insufficient documentation

## 2022-11-12 DIAGNOSIS — C50919 Malignant neoplasm of unspecified site of unspecified female breast: Secondary | ICD-10-CM | POA: Diagnosis not present

## 2022-11-12 DIAGNOSIS — Z9221 Personal history of antineoplastic chemotherapy: Secondary | ICD-10-CM | POA: Diagnosis not present

## 2022-11-12 DIAGNOSIS — Z803 Family history of malignant neoplasm of breast: Secondary | ICD-10-CM | POA: Insufficient documentation

## 2022-11-12 DIAGNOSIS — Z801 Family history of malignant neoplasm of trachea, bronchus and lung: Secondary | ICD-10-CM | POA: Diagnosis not present

## 2022-11-12 DIAGNOSIS — Z923 Personal history of irradiation: Secondary | ICD-10-CM | POA: Insufficient documentation

## 2022-11-12 DIAGNOSIS — M8580 Other specified disorders of bone density and structure, unspecified site: Secondary | ICD-10-CM | POA: Diagnosis not present

## 2022-11-12 DIAGNOSIS — Z8051 Family history of malignant neoplasm of kidney: Secondary | ICD-10-CM | POA: Diagnosis not present

## 2022-11-12 DIAGNOSIS — D649 Anemia, unspecified: Secondary | ICD-10-CM | POA: Insufficient documentation

## 2022-11-12 DIAGNOSIS — Z95828 Presence of other vascular implants and grafts: Secondary | ICD-10-CM | POA: Diagnosis not present

## 2022-11-12 DIAGNOSIS — Z808 Family history of malignant neoplasm of other organs or systems: Secondary | ICD-10-CM | POA: Diagnosis not present

## 2022-11-12 DIAGNOSIS — M810 Age-related osteoporosis without current pathological fracture: Secondary | ICD-10-CM | POA: Diagnosis not present

## 2022-11-12 DIAGNOSIS — Z79810 Long term (current) use of selective estrogen receptor modulators (SERMs): Secondary | ICD-10-CM | POA: Insufficient documentation

## 2022-11-12 DIAGNOSIS — I4891 Unspecified atrial fibrillation: Secondary | ICD-10-CM | POA: Insufficient documentation

## 2022-11-12 DIAGNOSIS — Z87891 Personal history of nicotine dependence: Secondary | ICD-10-CM | POA: Insufficient documentation

## 2022-11-12 DIAGNOSIS — C50211 Malignant neoplasm of upper-inner quadrant of right female breast: Secondary | ICD-10-CM | POA: Diagnosis not present

## 2022-11-12 LAB — COMPREHENSIVE METABOLIC PANEL
ALT: 10 U/L (ref 0–44)
AST: 25 U/L (ref 15–41)
Albumin: 3.5 g/dL (ref 3.5–5.0)
Alkaline Phosphatase: 55 U/L (ref 38–126)
Anion gap: 8 (ref 5–15)
BUN: 13 mg/dL (ref 8–23)
CO2: 29 mmol/L (ref 22–32)
Calcium: 8.7 mg/dL — ABNORMAL LOW (ref 8.9–10.3)
Chloride: 99 mmol/L (ref 98–111)
Creatinine, Ser: 0.99 mg/dL (ref 0.44–1.00)
GFR, Estimated: >60 mL/min (ref >60)
Glucose, Bld: 125 mg/dL — ABNORMAL HIGH (ref 70–99)
Potassium: 3.6 mmol/L (ref 3.5–5.1)
Sodium: 136 mmol/L (ref 135–145)
Total Bilirubin: 0.4 mg/dL (ref 0.3–1.2)
Total Protein: 7.4 g/dL (ref 6.5–8.1)

## 2022-11-12 LAB — CBC WITH DIFFERENTIAL/PLATELET
Abs Immature Granulocytes: 0.02 10*3/uL (ref 0.00–0.07)
Basophils Absolute: 0 10*3/uL (ref 0.0–0.1)
Basophils Relative: 1 %
Eosinophils Absolute: 0.2 10*3/uL (ref 0.0–0.5)
Eosinophils Relative: 3 %
HCT: 32.5 % — ABNORMAL LOW (ref 36.0–46.0)
Hemoglobin: 10.3 g/dL — ABNORMAL LOW (ref 12.0–15.0)
Immature Granulocytes: 0 %
Lymphocytes Relative: 32 %
Lymphs Abs: 2.4 10*3/uL (ref 0.7–4.0)
MCH: 28.9 pg (ref 26.0–34.0)
MCHC: 31.7 g/dL (ref 30.0–36.0)
MCV: 91.3 fL (ref 80.0–100.0)
Monocytes Absolute: 0.7 10*3/uL (ref 0.1–1.0)
Monocytes Relative: 9 %
Neutro Abs: 4.2 10*3/uL (ref 1.7–7.7)
Neutrophils Relative %: 55 %
Platelets: 198 10*3/uL (ref 150–400)
RBC: 3.56 MIL/uL — ABNORMAL LOW (ref 3.87–5.11)
RDW: 15.4 % (ref 11.5–15.5)
WBC: 7.6 10*3/uL (ref 4.0–10.5)
nRBC: 0 % (ref 0.0–0.2)

## 2022-11-12 MED ORDER — HEPARIN SOD (PORK) LOCK FLUSH 100 UNIT/ML IV SOLN
500.0000 [IU] | Freq: Once | INTRAVENOUS | Status: AC
  Administered 2022-11-12: 500 [IU] via INTRAVENOUS
  Filled 2022-11-12: qty 5

## 2022-11-12 MED ORDER — SODIUM CHLORIDE 0.9% FLUSH
10.0000 mL | Freq: Once | INTRAVENOUS | Status: AC
  Administered 2022-11-12: 10 mL via INTRAVENOUS
  Filled 2022-11-12: qty 10

## 2022-11-12 MED ORDER — TAMOXIFEN CITRATE 20 MG PO TABS: 20.0000 mg | ORAL_TABLET | Freq: Every day | ORAL | 1 refills | Status: DC

## 2022-11-12 NOTE — Assessment & Plan Note (Signed)
Continue port flush q. 8weeks

## 2022-11-12 NOTE — Assessment & Plan Note (Addendum)
01/15/22 Osteoporosis 10 year FRAX  19.9% Recommend calcium 1200 mg  and vitamin D  daily supplementation.

## 2022-11-12 NOTE — Progress Notes (Signed)
Survivorship Care Plan visit completed.  Treatment summary reviewed and given to patient.  ASCO answers booklet reviewed and given to patient.  CARE program and Cancer Transitions discussed with patient along with other resources cancer center offers to patients and caregivers.  Patient verbalized understanding.    
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
## 2022-11-12 NOTE — Progress Notes (Signed)
Radiation Oncology Follow up Note  Name: Debra [name]   Date:   11/12/2022 MRN:  [id number] DOB: [birth year]    This 67 y.o. female presents to the clinic today for 1 year follow-up status post whole breast radiation to her right breast for stage Ib triple positive invasive mammary carcinoma status post neoadjuvant chemotherapy and then wide local excision   REFERRING PROVIDER: [name], MD  HPI: Patient is a 67 year old female now out 1 year having completed whole breast radiation to her right breast for stage Ib triple positive invasive mammary carcinoma seen today in routine follow-up she is doing well she does have some increased scarring of her right breast which she states is sometimes firm sometimes soft really not at clinical problem.  She has not had a recent mammogram..  She is does not seem to be on endocrine therapy at this time.  COMPLICATIONS OF TREATMENT: none  FOLLOW UP COMPLIANCE: keeps appointments   PHYSICAL EXAM:  BP 124/76   Pulse 99   Temp (!) 96.7 F (35.9 C)   Resp 16   Ht 5\' 5"  (1.651 m)   Wt 177 lb 4.8 oz (80.4 kg)   BMI 29.50 kg/m  Right breast somewhat firmer than the left with significant fibrosis present.  No dominant masses noted in either breast no axillary or supraclavicular adenopathy is appreciated.  Well-developed well-nourished patient in NAD. HEENT reveals PERLA, EOMI, discs not visualized.  Oral cavity is clear. No oral mucosal lesions are identified. Neck is clear without evidence of cervical or supraclavicular adenopathy. Lungs are clear to A&P. Cardiac examination is essentially unremarkable with regular rate and rhythm without murmur rub or thrill. Abdomen is benign with no organomegaly or masses noted. Motor sensory and DTR levels are equal and symmetric in the upper and lower extremities. Cranial nerves II through XII are grossly intact. Proprioception is intact. No peripheral adenopathy or edema is identified. No motor or sensory levels  are noted. Crude visual fields are within normal range.  RADIOLOGY RESULTS: No current mammograms  PLAN: Present time I am going to turn follow-up care over to medical oncology patient does have transportation issues making visits difficult.  I be happy to reevaluate the patient in time should that be indicated.  I assume Dr. Tasia Catchings will be ordering a follow-up mammogram.  Patient knows to call with any concerns.  I would like to take this opportunity to thank you for allowing me to participate in the care of your patient.Noreene Filbert, MD

## 2022-11-12 NOTE — Assessment & Plan Note (Signed)
Due to chronic disease and previous chemotherapy. Hemoglobin stable.  Monitor 

## 2022-11-12 NOTE — Assessment & Plan Note (Addendum)
Right breast cT1c cN0 invasive carcinoma, ER/PR positive, HER2 positive tumor size appears larger, 2.5cm,  T2  on MRI,  Baseline normal CA 27-29, CEA 15-3 S/p TCH x2, and TCHP x4 followed by lumpectomy and sentinel lymph node biopsy. ypT1a ypN0- Residual disease after neoadjuvant chemotherapy, status post adjuvant radiation Finished 1 year of Kadcyla Labs reviewed and discussed with patient. Continue tamoxifen 20 mg daily.-Patient is on Eliquis for atrial fibrillation. Recommend gynecology evaluation annually-refer to gynecology Annual mammogram, Dr.Cintron's office will arrange.

## 2022-11-13 LAB — CANCER ANTIGEN 15-3: CA 15-3: 17.3 U/mL (ref 0.0–25.0)

## 2022-11-13 LAB — CA 27.29 (SERIAL MONITOR): CA 27.29: 17.5 U/mL (ref 0.0–38.6)

## 2022-11-14 ENCOUNTER — Other Ambulatory Visit: Payer: Medicare PPO

## 2022-11-18 ENCOUNTER — Other Ambulatory Visit: Payer: Self-pay

## 2022-11-18 ENCOUNTER — Other Ambulatory Visit: Payer: Medicare PPO

## 2022-11-18 ENCOUNTER — Ambulatory Visit: Payer: Medicare PPO | Admitting: Oncology

## 2022-12-18 ENCOUNTER — Other Ambulatory Visit: Payer: Self-pay | Admitting: General Surgery

## 2022-12-18 DIAGNOSIS — Z853 Personal history of malignant neoplasm of breast: Secondary | ICD-10-CM

## 2023-01-08 ENCOUNTER — Inpatient Hospital Stay: Payer: Medicare Other | Attending: Oncology

## 2023-01-08 DIAGNOSIS — Z95828 Presence of other vascular implants and grafts: Secondary | ICD-10-CM

## 2023-01-08 DIAGNOSIS — C50211 Malignant neoplasm of upper-inner quadrant of right female breast: Secondary | ICD-10-CM | POA: Diagnosis present

## 2023-01-08 DIAGNOSIS — Z452 Encounter for adjustment and management of vascular access device: Secondary | ICD-10-CM | POA: Diagnosis present

## 2023-01-08 MED ORDER — SODIUM CHLORIDE 0.9% FLUSH
10.0000 mL | Freq: Once | INTRAVENOUS | Status: AC
  Administered 2023-01-08: 10 mL via INTRAVENOUS
  Filled 2023-01-08: qty 10

## 2023-01-08 MED ORDER — HEPARIN SOD (PORK) LOCK FLUSH 100 UNIT/ML IV SOLN
500.0000 [IU] | Freq: Once | INTRAVENOUS | Status: AC
  Administered 2023-01-08: 500 [IU] via INTRAVENOUS
  Filled 2023-01-08: qty 5

## 2023-01-21 ENCOUNTER — Other Ambulatory Visit: Payer: Self-pay

## 2023-01-21 DIAGNOSIS — I480 Paroxysmal atrial fibrillation: Secondary | ICD-10-CM

## 2023-01-21 MED ORDER — APIXABAN 5 MG PO TABS
5.0000 mg | ORAL_TABLET | Freq: Two times a day (BID) | ORAL | 1 refills | Status: DC
Start: 2023-01-21 — End: 2023-01-23

## 2023-01-21 NOTE — Telephone Encounter (Signed)
Prescription refill request for Eliquis received. Indication:afib Last office visit:12/23 Scr:1.0  5/24 Age: 67 Weight:81.1  kg  Prescription refilled

## 2023-01-23 ENCOUNTER — Ambulatory Visit: Payer: Medicare Other | Attending: Cardiology | Admitting: Cardiology

## 2023-01-23 ENCOUNTER — Encounter: Payer: Self-pay | Admitting: Cardiology

## 2023-01-23 VITALS — BP 118/70 | HR 82 | Ht 64.0 in | Wt 180.6 lb

## 2023-01-23 DIAGNOSIS — E7800 Pure hypercholesterolemia, unspecified: Secondary | ICD-10-CM | POA: Diagnosis not present

## 2023-01-23 DIAGNOSIS — I480 Paroxysmal atrial fibrillation: Secondary | ICD-10-CM | POA: Diagnosis not present

## 2023-01-23 MED ORDER — APIXABAN 5 MG PO TABS
5.0000 mg | ORAL_TABLET | Freq: Two times a day (BID) | ORAL | 3 refills | Status: DC
Start: 2023-01-23 — End: 2023-11-10

## 2023-01-23 MED ORDER — DILTIAZEM HCL ER COATED BEADS 120 MG PO CP24: 120.0000 mg | ORAL_CAPSULE | Freq: Every day | ORAL | 3 refills | Status: DC

## 2023-01-23 NOTE — Patient Instructions (Signed)
Medication Instructions:    WHEN YOU GET HOME CHECK TO SEE IF YOU TAKE diltiazem (CARDIZEM CD) 120 MG 24 hr capsule   Your physician recommends that you continue on your current medications as directed. Please refer to the Current Medication list given to you today.  *If you need a refill on your cardiac medications before your next appointment, please call your pharmacy*   Lab Work:  None Ordered  If you have labs (blood work) drawn today and your tests are completely normal, you will receive your results only by: MyChart Message (if you have MyChart) OR A paper copy in the mail If you have any lab test that is abnormal or we need to change your treatment, we will call you to review the results.   Testing/Procedures:  None Ordered   Follow-Up: At [facility], you and your health needs are our priority.  As part of our continuing mission to provide you with exceptional heart care, we have created designated Provider Care Teams.  These Care Teams include your primary Cardiologist (physician) and Advanced Practice Providers (APPs -  Physician Assistants and Nurse Practitioners) who all work together to provide you with the care you need, when you need it.  We recommend signing up for the patient portal called "MyChart".  Sign up information is provided on this After Visit Summary.  MyChart is used to connect with patients for Virtual Visits (Telemedicine).  Patients are able to view lab/test results, encounter notes, upcoming appointments, etc.  Non-urgent messages can be sent to your provider as well.   To learn more about what you can do with MyChart, go to [email or web address].    Your next appointment:   12 month(s)  Provider:   You may see Debbe Odea, MD or one of the following Advanced Practice Providers on your designated Care Team:   Nicolasa Ducking, NP Eula Listen, PA-C Cadence Fransico Michael, PA-C Charlsie Quest, NP
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
## 2023-02-02 ENCOUNTER — Ambulatory Visit
Admission: RE | Admit: 2023-02-02 | Discharge: 2023-02-02 | Disposition: A | Discharge status: Home / Self Care | Payer: Medicare Other | Source: Ambulatory Visit | Attending: General Surgery | Admitting: General Surgery

## 2023-02-02 ENCOUNTER — Inpatient Hospital Stay: Payer: Medicare Other | Attending: Oncology

## 2023-02-02 DIAGNOSIS — D649 Anemia, unspecified: Secondary | ICD-10-CM | POA: Diagnosis not present

## 2023-02-02 DIAGNOSIS — C50211 Malignant neoplasm of upper-inner quadrant of right female breast: Secondary | ICD-10-CM | POA: Insufficient documentation

## 2023-02-02 DIAGNOSIS — M8580 Other specified disorders of bone density and structure, unspecified site: Secondary | ICD-10-CM | POA: Diagnosis not present

## 2023-02-02 DIAGNOSIS — Z7901 Long term (current) use of anticoagulants: Secondary | ICD-10-CM | POA: Diagnosis not present

## 2023-02-02 DIAGNOSIS — Z853 Personal history of malignant neoplasm of breast: Secondary | ICD-10-CM | POA: Insufficient documentation

## 2023-02-02 DIAGNOSIS — I4891 Unspecified atrial fibrillation: Secondary | ICD-10-CM | POA: Diagnosis not present

## 2023-02-02 DIAGNOSIS — Z923 Personal history of irradiation: Secondary | ICD-10-CM | POA: Insufficient documentation

## 2023-02-02 DIAGNOSIS — Z170 Estrogen receptor positive status [ER+]: Secondary | ICD-10-CM | POA: Diagnosis not present

## 2023-02-02 DIAGNOSIS — Z79810 Long term (current) use of selective estrogen receptor modulators (SERMs): Secondary | ICD-10-CM | POA: Diagnosis not present

## 2023-02-02 DIAGNOSIS — Z9221 Personal history of antineoplastic chemotherapy: Secondary | ICD-10-CM | POA: Diagnosis not present

## 2023-02-02 DIAGNOSIS — C50919 Malignant neoplasm of unspecified site of unspecified female breast: Secondary | ICD-10-CM

## 2023-02-02 DIAGNOSIS — Z95828 Presence of other vascular implants and grafts: Secondary | ICD-10-CM

## 2023-02-02 LAB — CBC WITH DIFFERENTIAL (CANCER CENTER ONLY)
Abs Immature Granulocytes: 0.07 10*3/uL (ref 0.00–0.07)
Basophils Absolute: 0 10*3/uL (ref 0.0–0.1)
Basophils Relative: 0 %
Eosinophils Absolute: 0 10*3/uL (ref 0.0–0.5)
Eosinophils Relative: 0 %
HCT: 35.3 % — ABNORMAL LOW (ref 36.0–46.0)
Hemoglobin: 11.2 g/dL — ABNORMAL LOW (ref 12.0–15.0)
Immature Granulocytes: 1 %
Lymphocytes Relative: 12 %
Lymphs Abs: 1.1 10*3/uL (ref 0.7–4.0)
MCH: 28.9 pg (ref 26.0–34.0)
MCHC: 31.7 g/dL (ref 30.0–36.0)
MCV: 91 fL (ref 80.0–100.0)
Monocytes Absolute: 0.1 10*3/uL (ref 0.1–1.0)
Monocytes Relative: 1 %
Neutro Abs: 7.3 10*3/uL (ref 1.7–7.7)
Neutrophils Relative %: 86 %
Platelet Count: 242 10*3/uL (ref 150–400)
RBC: 3.88 MIL/uL (ref 3.87–5.11)
RDW: 14 % (ref 11.5–15.5)
WBC Count: 8.6 10*3/uL (ref 4.0–10.5)
nRBC: 0 % (ref 0.0–0.2)

## 2023-02-02 LAB — CMP (CANCER CENTER ONLY)
ALT: 13 U/L (ref 0–44)
AST: 28 U/L (ref 15–41)
Albumin: 3.6 g/dL (ref 3.5–5.0)
Alkaline Phosphatase: 55 U/L (ref 38–126)
Anion gap: 12 (ref 5–15)
BUN: 13 mg/dL (ref 8–23)
CO2: 32 mmol/L (ref 22–32)
Calcium: 9.3 mg/dL (ref 8.9–10.3)
Chloride: 95 mmol/L — ABNORMAL LOW (ref 98–111)
Creatinine: 0.85 mg/dL (ref 0.44–1.00)
GFR, Estimated: >60 mL/min (ref >60)
Glucose, Bld: 178 mg/dL — ABNORMAL HIGH (ref 70–99)
Potassium: 3.3 mmol/L — ABNORMAL LOW (ref 3.5–5.1)
Sodium: 139 mmol/L (ref 135–145)
Total Bilirubin: 0.5 mg/dL (ref 0.3–1.2)
Total Protein: 7.9 g/dL (ref 6.5–8.1)

## 2023-02-02 LAB — IRON AND TIBC
Iron: 82 ug/dL (ref 28–170)
Saturation Ratios: 22 % (ref 10.4–31.8)
TIBC: 378 ug/dL (ref 250–450)
UIBC: 296 ug/dL

## 2023-02-02 LAB — FERRITIN: Ferritin: 63 ng/mL (ref 11–307)

## 2023-02-02 MED ORDER — SODIUM CHLORIDE 0.9% FLUSH
10.0000 mL | Freq: Once | INTRAVENOUS | Status: AC
  Administered 2023-02-02: 10 mL via INTRAVENOUS
  Filled 2023-02-02: qty 10

## 2023-02-02 MED ORDER — HEPARIN SOD (PORK) LOCK FLUSH 100 UNIT/ML IV SOLN
500.0000 [IU] | Freq: Once | INTRAVENOUS | Status: AC
  Administered 2023-02-02: 500 [IU] via INTRAVENOUS
  Filled 2023-02-02: qty 5

## 2023-02-03 LAB — CANCER ANTIGEN 27.29: CA 27.29: 17.7 U/mL (ref 0.0–38.6)

## 2023-02-04 ENCOUNTER — Inpatient Hospital Stay (HOSPITAL_BASED_OUTPATIENT_CLINIC_OR_DEPARTMENT_OTHER): Payer: Medicare Other | Admitting: Oncology

## 2023-02-04 ENCOUNTER — Encounter: Payer: Self-pay | Admitting: Oncology

## 2023-02-04 VITALS — BP 126/59 | HR 82 | Temp 98.2°F | Wt 182.4 lb

## 2023-02-04 DIAGNOSIS — M8580 Other specified disorders of bone density and structure, unspecified site: Secondary | ICD-10-CM

## 2023-02-04 DIAGNOSIS — D649 Anemia, unspecified: Secondary | ICD-10-CM | POA: Diagnosis not present

## 2023-02-04 DIAGNOSIS — C50919 Malignant neoplasm of unspecified site of unspecified female breast: Secondary | ICD-10-CM

## 2023-02-04 DIAGNOSIS — Z95828 Presence of other vascular implants and grafts: Secondary | ICD-10-CM | POA: Diagnosis not present

## 2023-02-04 DIAGNOSIS — C50211 Malignant neoplasm of upper-inner quadrant of right female breast: Secondary | ICD-10-CM | POA: Diagnosis not present

## 2023-02-04 LAB — CANCER ANTIGEN 15-3: CA 15-3: 13.5 U/mL (ref 0.0–25.0)

## 2023-02-04 NOTE — Assessment & Plan Note (Signed)
Continue port flush q. 8weeks 
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
## 2023-02-04 NOTE — Assessment & Plan Note (Addendum)
Right breast cT1c cN0 invasive carcinoma, ER/PR positive, HER2 positive tumor size appears larger, 2.5cm,  T2  on MRI,  Baseline normal CA 27-29, CEA 15-3 S/p TCH x2, and TCHP x4 followed by lumpectomy and sentinel lymph node biopsy. ypT1a ypN0- Residual disease after neoadjuvant chemotherapy, status post adjuvant radiation Finished 1 year of Kadcyla Labs reviewed and discussed with patient. Continue tamoxifen 20 mg daily.-Patient is on Eliquis for atrial fibrillation. Recommend gynecology evaluation annually-refer to gynecology Annual mammogram-no evidence of recurrence.  Recommend right diagnostic mammogram in 6 months-Dr. Cintron's office

## 2023-02-04 NOTE — Assessment & Plan Note (Signed)
Due to chronic disease and previous chemotherapy. Hemoglobin stable.  Monitor 

## 2023-02-04 NOTE — Assessment & Plan Note (Signed)
01/15/22 Osteoporosis 10 year FRAX  19.9% Recommend calcium 1200 mg  and vitamin D  daily supplementation.  

## 2023-02-05 ENCOUNTER — Other Ambulatory Visit: Payer: Self-pay

## 2023-02-05 IMAGING — MR MR BREAST BILAT WO/W CM
2 of 9 series · 6 of 48 positions shown · IV contrast (gadavist)
Comparison: Previous exam(s).

CLINICAL DATA: 65-year-old female presenting for evaluation
following neoadjuvant chemotherapy. Patient was diagnosed with right
breast invasive mammary carcinoma in December 2020.

EXAM:
BILATERAL BREAST MRI WITH AND WITHOUT CONTRAST
TECHNIQUE: Multiplanar, multisequence MR images of both breasts were obtained
prior to and following the intravenous administration of 7.5 ml of
Gadavist

[Series 4: T1 · axial · B · 1.5mm · 1.02mm/px · z∈[-57,+121]mm · 5 of 117 slices shown]
[im 1/117]
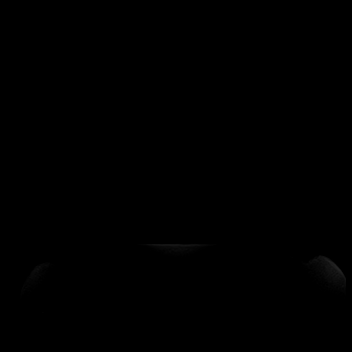
[im 30/117]
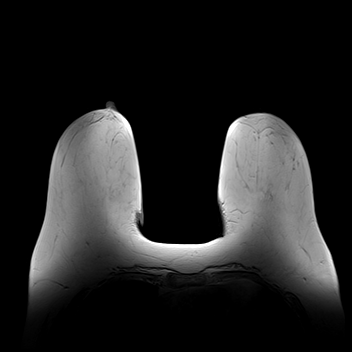
[im 59/117]
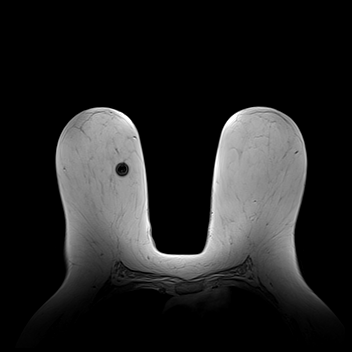
[im 88/117]
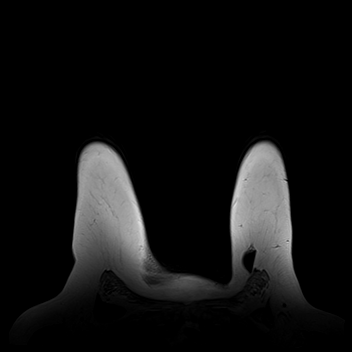
[im 117/117]
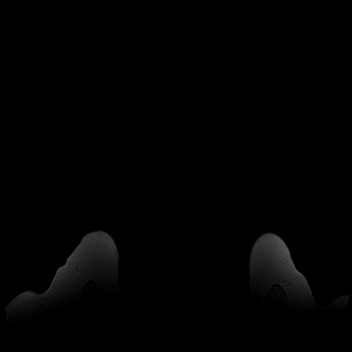

[Series 5: T2 · axial · B · 3.0mm · 1.02mm/px · 1 of 48 slices shown]
[im 1/48]
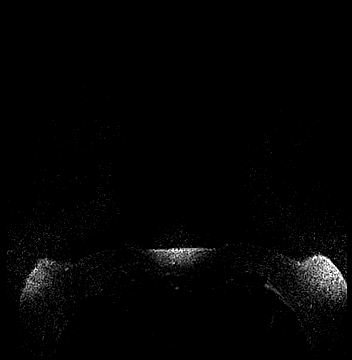

[6 of 48 positions shown; findings below may reference images not displayed]

Three-dimensional MR images were rendered by post-processing of the
original MR data on an independent workstation. The
three-dimensional MR images were interpreted, and findings are
reported in the following complete MRI report for this study. Three
dimensional images were evaluated at the independent interpreting
workstation using the DynaCAD thin client.
FINDINGS: Breast composition: a. Almost entirely fat.

Background parenchymal enhancement: Minimal

Right breast: There is susceptibility artifact at the site of
biopsy-proven malignancy in the upper inner right breast. There has
been significant interval decrease in size of the biopsy-proven
irregular enhancing mass now measuring 0.9 x 0.5 x 0.6 cm (series
15, image 61), previously 2.5 x 1.5 x 2.1 cm. There has been
resolution of the previously seen additional non mass enhancement
posterior and medial to the biopsy site, likely representing post
biopsy change. There is no new suspicious mass or non mass
enhancement in the right breast.

Left breast: No mass or abnormal enhancement.

Lymph nodes: No abnormal appearing lymph nodes.

Ancillary findings:  None.
IMPRESSION: Interval decrease in size of the biopsy-proven malignancy in the
upper inner right breast now measuring up to 0.9 cm. No new
suspicious findings.

RECOMMENDATION:
Continue treatment plan for known right breast cancer.

BI-RADS CATEGORY  6: Known biopsy-proven malignancy.

## 2023-02-09 ENCOUNTER — Other Ambulatory Visit: Payer: Medicare Other

## 2023-02-12 ENCOUNTER — Ambulatory Visit: Payer: Medicare Other | Admitting: Oncology

## 2023-04-01 ENCOUNTER — Inpatient Hospital Stay: Payer: Medicare Other | Attending: Oncology

## 2023-04-01 DIAGNOSIS — Z452 Encounter for adjustment and management of vascular access device: Secondary | ICD-10-CM | POA: Insufficient documentation

## 2023-04-01 DIAGNOSIS — C50211 Malignant neoplasm of upper-inner quadrant of right female breast: Secondary | ICD-10-CM | POA: Diagnosis present

## 2023-04-01 DIAGNOSIS — Z95828 Presence of other vascular implants and grafts: Secondary | ICD-10-CM

## 2023-04-01 MED ORDER — SODIUM CHLORIDE 0.9% FLUSH
10.0000 mL | Freq: Once | INTRAVENOUS | Status: AC
  Administered 2023-04-01: 10 mL via INTRAVENOUS
  Filled 2023-04-01: qty 10

## 2023-04-01 MED ORDER — HEPARIN SOD (PORK) LOCK FLUSH 100 UNIT/ML IV SOLN
500.0000 [IU] | Freq: Once | INTRAVENOUS | Status: AC
  Administered 2023-04-01: 500 [IU] via INTRAVENOUS
  Filled 2023-04-01: qty 5

## 2023-04-22 ENCOUNTER — Other Ambulatory Visit: Payer: Self-pay | Admitting: Physical Medicine and Rehabilitation

## 2023-04-22 ENCOUNTER — Ambulatory Visit
Admission: RE | Admit: 2023-04-22 | Discharge: 2023-04-22 | Disposition: A | Discharge status: Home / Self Care | Payer: Medicare Other | Source: Ambulatory Visit | Attending: Physical Medicine and Rehabilitation | Admitting: Physical Medicine and Rehabilitation

## 2023-04-22 DIAGNOSIS — M25562 Pain in left knee: Secondary | ICD-10-CM

## 2023-05-10 ENCOUNTER — Other Ambulatory Visit: Payer: Self-pay | Admitting: Oncology

## 2023-05-20 ENCOUNTER — Other Ambulatory Visit: Payer: Self-pay | Admitting: Oncology

## 2023-05-20 DIAGNOSIS — C50919 Malignant neoplasm of unspecified site of unspecified female breast: Secondary | ICD-10-CM

## 2023-05-21 ENCOUNTER — Encounter: Payer: Self-pay | Admitting: Oncology

## 2023-06-08 ENCOUNTER — Inpatient Hospital Stay: Payer: Medicare Other | Attending: Oncology

## 2023-06-08 DIAGNOSIS — C50919 Malignant neoplasm of unspecified site of unspecified female breast: Secondary | ICD-10-CM

## 2023-06-08 DIAGNOSIS — C50211 Malignant neoplasm of upper-inner quadrant of right female breast: Secondary | ICD-10-CM | POA: Diagnosis present

## 2023-06-08 DIAGNOSIS — Z95828 Presence of other vascular implants and grafts: Secondary | ICD-10-CM

## 2023-06-08 LAB — IRON AND TIBC
Iron: 95 ug/dL (ref 28–170)
Saturation Ratios: 27 % (ref 10.4–31.8)
TIBC: 347 ug/dL (ref 250–450)
UIBC: 252 ug/dL

## 2023-06-08 LAB — CMP (CANCER CENTER ONLY)
ALT: 16 U/L (ref 0–44)
AST: 21 U/L (ref 15–41)
Albumin: 3.4 g/dL — ABNORMAL LOW (ref 3.5–5.0)
Alkaline Phosphatase: 48 U/L (ref 38–126)
Anion gap: 8 (ref 5–15)
BUN: 14 mg/dL (ref 8–23)
CO2: 35 mmol/L — ABNORMAL HIGH (ref 22–32)
Calcium: 8.7 mg/dL — ABNORMAL LOW (ref 8.9–10.3)
Chloride: 95 mmol/L — ABNORMAL LOW (ref 98–111)
Creatinine: 0.69 mg/dL (ref 0.44–1.00)
GFR, Estimated: >60 mL/min (ref >60)
Glucose, Bld: 110 mg/dL — ABNORMAL HIGH (ref 70–99)
Potassium: 3.4 mmol/L — ABNORMAL LOW (ref 3.5–5.1)
Sodium: 138 mmol/L (ref 135–145)
Total Bilirubin: 0.5 mg/dL (ref 0.3–1.2)
Total Protein: 7.1 g/dL (ref 6.5–8.1)

## 2023-06-08 LAB — CBC WITH DIFFERENTIAL (CANCER CENTER ONLY)
Abs Immature Granulocytes: 0.03 10*3/uL (ref 0.00–0.07)
Basophils Absolute: 0 10*3/uL (ref 0.0–0.1)
Basophils Relative: 0 %
Eosinophils Absolute: 0.4 10*3/uL (ref 0.0–0.5)
Eosinophils Relative: 5 %
HCT: 34.3 % — ABNORMAL LOW (ref 36.0–46.0)
Hemoglobin: 10.9 g/dL — ABNORMAL LOW (ref 12.0–15.0)
Immature Granulocytes: 0 %
Lymphocytes Relative: 24 %
Lymphs Abs: 1.7 10*3/uL (ref 0.7–4.0)
MCH: 30.2 pg (ref 26.0–34.0)
MCHC: 31.8 g/dL (ref 30.0–36.0)
MCV: 95 fL (ref 80.0–100.0)
Monocytes Absolute: 0.4 10*3/uL (ref 0.1–1.0)
Monocytes Relative: 6 %
Neutro Abs: 4.6 10*3/uL (ref 1.7–7.7)
Neutrophils Relative %: 65 %
Platelet Count: 243 10*3/uL (ref 150–400)
RBC: 3.61 MIL/uL — ABNORMAL LOW (ref 3.87–5.11)
RDW: 13.9 % (ref 11.5–15.5)
WBC Count: 7.2 10*3/uL (ref 4.0–10.5)
nRBC: 0 % (ref 0.0–0.2)

## 2023-06-08 LAB — FERRITIN: Ferritin: 97 ng/mL (ref 11–307)

## 2023-06-08 MED ORDER — HEPARIN SOD (PORK) LOCK FLUSH 100 UNIT/ML IV SOLN
500.0000 [IU] | Freq: Once | INTRAVENOUS | Status: AC
Start: 2023-06-08 — End: 2023-06-08
  Administered 2023-06-08: 500 [IU] via INTRAVENOUS
  Filled 2023-06-08: qty 5

## 2023-06-08 MED ORDER — SODIUM CHLORIDE 0.9% FLUSH
10.0000 mL | Freq: Once | INTRAVENOUS | Status: AC
  Administered 2023-06-08: 10 mL via INTRAVENOUS
  Filled 2023-06-08: qty 10

## 2023-06-10 ENCOUNTER — Inpatient Hospital Stay: Payer: Medicare Other | Admitting: Oncology

## 2023-06-10 ENCOUNTER — Other Ambulatory Visit: Payer: Self-pay

## 2023-06-10 DIAGNOSIS — C50919 Malignant neoplasm of unspecified site of unspecified female breast: Secondary | ICD-10-CM

## 2023-06-10 MED ORDER — ONDANSETRON HCL 8 MG PO TABS: 8.0000 mg | ORAL_TABLET | Freq: Three times a day (TID) | ORAL | 0 refills | Status: DC | PRN

## 2023-06-15 ENCOUNTER — Telehealth: Payer: Self-pay

## 2023-06-15 NOTE — Telephone Encounter (Signed)
Morrie Sheldon can you please reschedule and notify patient.

## 2023-06-15 NOTE — Telephone Encounter (Signed)
-----   Message from Rickard Patience sent at 06/15/2023 10:20 AM EDT ----- She no showed to last appt. Please reschedule MD

## 2023-06-18 ENCOUNTER — Other Ambulatory Visit: Payer: Self-pay | Admitting: General Surgery

## 2023-06-18 DIAGNOSIS — Z853 Personal history of malignant neoplasm of breast: Secondary | ICD-10-CM

## 2023-06-19 ENCOUNTER — Other Ambulatory Visit: Payer: Self-pay

## 2023-06-20 LAB — CANCER ANTIGEN 15-3: CA 15-3: 19.7 U/mL (ref 0.0–25.0)

## 2023-06-20 LAB — CANCER ANTIGEN 27.29: CA 27.29: 19.6 U/mL (ref 0.0–38.6)

## 2023-06-24 ENCOUNTER — Inpatient Hospital Stay: Payer: Medicare Other | Attending: Oncology | Admitting: Oncology

## 2023-06-24 ENCOUNTER — Telehealth: Payer: Self-pay

## 2023-06-24 ENCOUNTER — Encounter: Payer: Self-pay | Admitting: Oncology

## 2023-06-24 VITALS — BP 124/51 | HR 87 | Temp 97.5°F | Resp 18 | Wt 187.4 lb

## 2023-06-24 DIAGNOSIS — Z801 Family history of malignant neoplasm of trachea, bronchus and lung: Secondary | ICD-10-CM | POA: Insufficient documentation

## 2023-06-24 DIAGNOSIS — Z95828 Presence of other vascular implants and grafts: Secondary | ICD-10-CM | POA: Diagnosis not present

## 2023-06-24 DIAGNOSIS — C50919 Malignant neoplasm of unspecified site of unspecified female breast: Secondary | ICD-10-CM

## 2023-06-24 DIAGNOSIS — Z170 Estrogen receptor positive status [ER+]: Secondary | ICD-10-CM | POA: Insufficient documentation

## 2023-06-24 DIAGNOSIS — Z803 Family history of malignant neoplasm of breast: Secondary | ICD-10-CM | POA: Diagnosis not present

## 2023-06-24 DIAGNOSIS — Z87891 Personal history of nicotine dependence: Secondary | ICD-10-CM | POA: Insufficient documentation

## 2023-06-24 DIAGNOSIS — M8580 Other specified disorders of bone density and structure, unspecified site: Secondary | ICD-10-CM | POA: Insufficient documentation

## 2023-06-24 DIAGNOSIS — Z808 Family history of malignant neoplasm of other organs or systems: Secondary | ICD-10-CM | POA: Insufficient documentation

## 2023-06-24 DIAGNOSIS — Z8051 Family history of malignant neoplasm of kidney: Secondary | ICD-10-CM | POA: Insufficient documentation

## 2023-06-24 DIAGNOSIS — D649 Anemia, unspecified: Secondary | ICD-10-CM | POA: Insufficient documentation

## 2023-06-24 DIAGNOSIS — C50211 Malignant neoplasm of upper-inner quadrant of right female breast: Secondary | ICD-10-CM | POA: Diagnosis present

## 2023-06-24 MED ORDER — TAMOXIFEN CITRATE 20 MG PO TABS: 20.0000 mg | ORAL_TABLET | Freq: Every day | ORAL | 1 refills | Status: DC

## 2023-06-24 NOTE — Assessment & Plan Note (Signed)
Due to chronic disease and previous chemotherapy. Hemoglobin stable.  Monitor 
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
## 2023-06-24 NOTE — Assessment & Plan Note (Signed)
01/15/22 Osteoporosis 10 year FRAX  19.9% Recommend calcium 1200 mg  and vitamin D  daily supplementation.

## 2023-06-24 NOTE — Assessment & Plan Note (Addendum)
Right breast cT1c cN0 invasive carcinoma, ER/PR positive, HER2 positive tumor size appears larger, 2.5cm,  T2  on MRI,  Baseline normal CA 27-29, CEA 15-3 S/p TCH x2, and TCHP x4 followed by lumpectomy and sentinel lymph node biopsy. ypT1a ypN0- Residual disease after neoadjuvant chemotherapy, status post adjuvant radiation Finished 1 year of Kadcyla Labs reviewed and discussed with patient. Continue tamoxifen 20 mg daily.-Patient is on Eliquis for atrial fibrillation. Recommend gynecology evaluation annually-I have referred her to gynecology Annual mammogram-no evidence of recurrence.  Recommend right diagnostic mammogram in 6 months-Dr. Cintron's office -scheduled in Dec 2024

## 2023-06-24 NOTE — Assessment & Plan Note (Addendum)
She desires port removal. Will ask surgery to remove in early Jan 2025 - one year after she finishes all treatment.

## 2023-06-24 NOTE — Telephone Encounter (Signed)
Order for port removal faxed to Dr. Vedia Coffer office. Requested to be removed in January. Fax confirmation received.

## 2023-06-25 ENCOUNTER — Other Ambulatory Visit: Payer: Self-pay

## 2023-08-05 ENCOUNTER — Ambulatory Visit
Admission: RE | Admit: 2023-08-05 | Discharge: 2023-08-05 | Disposition: A | Discharge status: Home / Self Care | Payer: Medicare Other | Source: Ambulatory Visit | Attending: General Surgery | Admitting: General Surgery

## 2023-08-05 ENCOUNTER — Ambulatory Visit
Admission: RE | Admit: 2023-08-05 | Discharge: 2023-08-05 | Disposition: A | Discharge status: Home / Self Care | Payer: Medicare Other | Source: Ambulatory Visit | Attending: General Surgery

## 2023-08-05 DIAGNOSIS — Z853 Personal history of malignant neoplasm of breast: Secondary | ICD-10-CM

## 2023-11-10 ENCOUNTER — Other Ambulatory Visit: Payer: Self-pay | Admitting: General Surgery

## 2023-11-10 ENCOUNTER — Encounter: Payer: Self-pay | Admitting: *Deleted

## 2023-11-10 ENCOUNTER — Inpatient Hospital Stay
Admission: EM | Admit: 2023-11-10 | Discharge: 2023-11-16 | DRG: 987 | Disposition: A | Discharge status: Home / Self Care | Attending: Internal Medicine | Admitting: Internal Medicine

## 2023-11-10 ENCOUNTER — Emergency Department

## 2023-11-10 ENCOUNTER — Other Ambulatory Visit: Payer: Self-pay | Admitting: *Deleted

## 2023-11-10 ENCOUNTER — Other Ambulatory Visit: Payer: Self-pay

## 2023-11-10 DIAGNOSIS — Z79810 Long term (current) use of selective estrogen receptor modulators (SERMs): Secondary | ICD-10-CM

## 2023-11-10 DIAGNOSIS — Z9011 Acquired absence of right breast and nipple: Secondary | ICD-10-CM

## 2023-11-10 DIAGNOSIS — I482 Chronic atrial fibrillation, unspecified: Secondary | ICD-10-CM | POA: Diagnosis present

## 2023-11-10 DIAGNOSIS — Z9981 Dependence on supplemental oxygen: Secondary | ICD-10-CM

## 2023-11-10 DIAGNOSIS — K219 Gastro-esophageal reflux disease without esophagitis: Secondary | ICD-10-CM | POA: Diagnosis present

## 2023-11-10 DIAGNOSIS — K148 Other diseases of tongue: Secondary | ICD-10-CM

## 2023-11-10 DIAGNOSIS — J449 Chronic obstructive pulmonary disease, unspecified: Secondary | ICD-10-CM | POA: Diagnosis not present

## 2023-11-10 DIAGNOSIS — Z803 Family history of malignant neoplasm of breast: Secondary | ICD-10-CM

## 2023-11-10 DIAGNOSIS — Z8249 Family history of ischemic heart disease and other diseases of the circulatory system: Secondary | ICD-10-CM

## 2023-11-10 DIAGNOSIS — C50919 Malignant neoplasm of unspecified site of unspecified female breast: Secondary | ICD-10-CM | POA: Diagnosis not present

## 2023-11-10 DIAGNOSIS — Z801 Family history of malignant neoplasm of trachea, bronchus and lung: Secondary | ICD-10-CM

## 2023-11-10 DIAGNOSIS — E041 Nontoxic single thyroid nodule: Secondary | ICD-10-CM | POA: Diagnosis present

## 2023-11-10 DIAGNOSIS — Z1731 Human epidermal growth factor receptor 2 positive status: Secondary | ICD-10-CM | POA: Diagnosis not present

## 2023-11-10 DIAGNOSIS — D6869 Other thrombophilia: Secondary | ICD-10-CM | POA: Diagnosis present

## 2023-11-10 DIAGNOSIS — Z9221 Personal history of antineoplastic chemotherapy: Secondary | ICD-10-CM

## 2023-11-10 DIAGNOSIS — J4489 Other specified chronic obstructive pulmonary disease: Secondary | ICD-10-CM | POA: Diagnosis present

## 2023-11-10 DIAGNOSIS — J690 Pneumonitis due to inhalation of food and vomit: Principal | ICD-10-CM | POA: Diagnosis present

## 2023-11-10 DIAGNOSIS — T45516A Underdosing of anticoagulants, initial encounter: Secondary | ICD-10-CM | POA: Diagnosis present

## 2023-11-10 DIAGNOSIS — I480 Paroxysmal atrial fibrillation: Secondary | ICD-10-CM | POA: Diagnosis present

## 2023-11-10 DIAGNOSIS — K838 Other specified diseases of biliary tract: Secondary | ICD-10-CM | POA: Diagnosis present

## 2023-11-10 DIAGNOSIS — I63412 Cerebral infarction due to embolism of left middle cerebral artery: Secondary | ICD-10-CM | POA: Diagnosis not present

## 2023-11-10 DIAGNOSIS — Z170 Estrogen receptor positive status [ER+]: Secondary | ICD-10-CM

## 2023-11-10 DIAGNOSIS — D649 Anemia, unspecified: Secondary | ICD-10-CM | POA: Diagnosis not present

## 2023-11-10 DIAGNOSIS — I639 Cerebral infarction, unspecified: Secondary | ICD-10-CM

## 2023-11-10 DIAGNOSIS — R7989 Other specified abnormal findings of blood chemistry: Secondary | ICD-10-CM | POA: Diagnosis present

## 2023-11-10 DIAGNOSIS — Z823 Family history of stroke: Secondary | ICD-10-CM

## 2023-11-10 DIAGNOSIS — I272 Pulmonary hypertension, unspecified: Secondary | ICD-10-CM | POA: Diagnosis present

## 2023-11-10 DIAGNOSIS — K769 Liver disease, unspecified: Secondary | ICD-10-CM

## 2023-11-10 DIAGNOSIS — Z818 Family history of other mental and behavioral disorders: Secondary | ICD-10-CM

## 2023-11-10 DIAGNOSIS — R197 Diarrhea, unspecified: Secondary | ICD-10-CM | POA: Diagnosis present

## 2023-11-10 DIAGNOSIS — Z6830 Body mass index (BMI) 30.0-30.9, adult: Secondary | ICD-10-CM

## 2023-11-10 DIAGNOSIS — E669 Obesity, unspecified: Secondary | ICD-10-CM | POA: Diagnosis present

## 2023-11-10 DIAGNOSIS — Z1152 Encounter for screening for COVID-19: Secondary | ICD-10-CM | POA: Diagnosis not present

## 2023-11-10 DIAGNOSIS — R29702 NIHSS score 2: Secondary | ICD-10-CM | POA: Diagnosis not present

## 2023-11-10 DIAGNOSIS — Z66 Do not resuscitate: Secondary | ICD-10-CM | POA: Diagnosis present

## 2023-11-10 DIAGNOSIS — Z7901 Long term (current) use of anticoagulants: Secondary | ICD-10-CM

## 2023-11-10 DIAGNOSIS — J9611 Chronic respiratory failure with hypoxia: Secondary | ICD-10-CM | POA: Diagnosis present

## 2023-11-10 DIAGNOSIS — Z95828 Presence of other vascular implants and grafts: Secondary | ICD-10-CM

## 2023-11-10 DIAGNOSIS — I700 Atherosclerosis of aorta: Secondary | ICD-10-CM | POA: Diagnosis present

## 2023-11-10 DIAGNOSIS — E785 Hyperlipidemia, unspecified: Secondary | ICD-10-CM | POA: Diagnosis present

## 2023-11-10 DIAGNOSIS — R112 Nausea with vomiting, unspecified: Secondary | ICD-10-CM | POA: Diagnosis present

## 2023-11-10 DIAGNOSIS — F411 Generalized anxiety disorder: Secondary | ICD-10-CM | POA: Diagnosis present

## 2023-11-10 DIAGNOSIS — Z853 Personal history of malignant neoplasm of breast: Secondary | ICD-10-CM | POA: Diagnosis not present

## 2023-11-10 DIAGNOSIS — C770 Secondary and unspecified malignant neoplasm of lymph nodes of head, face and neck: Secondary | ICD-10-CM | POA: Diagnosis present

## 2023-11-10 DIAGNOSIS — D6489 Other specified anemias: Secondary | ICD-10-CM | POA: Diagnosis present

## 2023-11-10 DIAGNOSIS — I6340 Cerebral infarction due to embolism of unspecified cerebral artery: Secondary | ICD-10-CM | POA: Diagnosis not present

## 2023-11-10 DIAGNOSIS — Z79899 Other long term (current) drug therapy: Secondary | ICD-10-CM

## 2023-11-10 DIAGNOSIS — Z825 Family history of asthma and other chronic lower respiratory diseases: Secondary | ICD-10-CM

## 2023-11-10 DIAGNOSIS — R29810 Facial weakness: Secondary | ICD-10-CM | POA: Diagnosis not present

## 2023-11-10 DIAGNOSIS — C787 Secondary malignant neoplasm of liver and intrahepatic bile duct: Principal | ICD-10-CM | POA: Diagnosis present

## 2023-11-10 DIAGNOSIS — I671 Cerebral aneurysm, nonruptured: Secondary | ICD-10-CM

## 2023-11-10 DIAGNOSIS — Z87891 Personal history of nicotine dependence: Secondary | ICD-10-CM | POA: Diagnosis not present

## 2023-11-10 DIAGNOSIS — R471 Dysarthria and anarthria: Secondary | ICD-10-CM | POA: Diagnosis not present

## 2023-11-10 DIAGNOSIS — Z808 Family history of malignant neoplasm of other organs or systems: Secondary | ICD-10-CM

## 2023-11-10 DIAGNOSIS — D630 Anemia in neoplastic disease: Secondary | ICD-10-CM | POA: Diagnosis present

## 2023-11-10 DIAGNOSIS — G8929 Other chronic pain: Secondary | ICD-10-CM | POA: Diagnosis present

## 2023-11-10 DIAGNOSIS — Z7983 Long term (current) use of bisphosphonates: Secondary | ICD-10-CM

## 2023-11-10 DIAGNOSIS — N179 Acute kidney failure, unspecified: Secondary | ICD-10-CM | POA: Diagnosis present

## 2023-11-10 DIAGNOSIS — Z881 Allergy status to other antibiotic agents status: Secondary | ICD-10-CM

## 2023-11-10 DIAGNOSIS — Z8051 Family history of malignant neoplasm of kidney: Secondary | ICD-10-CM

## 2023-11-10 DIAGNOSIS — C50911 Malignant neoplasm of unspecified site of right female breast: Secondary | ICD-10-CM | POA: Diagnosis present

## 2023-11-10 DIAGNOSIS — Z87440 Personal history of urinary (tract) infections: Secondary | ICD-10-CM

## 2023-11-10 DIAGNOSIS — I10 Essential (primary) hypertension: Secondary | ICD-10-CM | POA: Diagnosis present

## 2023-11-10 DIAGNOSIS — J189 Pneumonia, unspecified organism: Secondary | ICD-10-CM

## 2023-11-10 DIAGNOSIS — Z7951 Long term (current) use of inhaled steroids: Secondary | ICD-10-CM

## 2023-11-10 DIAGNOSIS — I7789 Other specified disorders of arteries and arterioles: Secondary | ICD-10-CM | POA: Diagnosis present

## 2023-11-10 DIAGNOSIS — E871 Hypo-osmolality and hyponatremia: Secondary | ICD-10-CM | POA: Diagnosis present

## 2023-11-10 DIAGNOSIS — Z91138 Patient's unintentional underdosing of medication regimen for other reason: Secondary | ICD-10-CM

## 2023-11-10 DIAGNOSIS — D6859 Other primary thrombophilia: Secondary | ICD-10-CM | POA: Diagnosis not present

## 2023-11-10 DIAGNOSIS — M21371 Foot drop, right foot: Secondary | ICD-10-CM | POA: Diagnosis present

## 2023-11-10 DIAGNOSIS — K5909 Other constipation: Secondary | ICD-10-CM | POA: Diagnosis present

## 2023-11-10 DIAGNOSIS — Z1721 Progesterone receptor positive status: Secondary | ICD-10-CM

## 2023-11-10 DIAGNOSIS — Z9049 Acquired absence of other specified parts of digestive tract: Secondary | ICD-10-CM

## 2023-11-10 DIAGNOSIS — E279 Disorder of adrenal gland, unspecified: Secondary | ICD-10-CM | POA: Diagnosis present

## 2023-11-10 DIAGNOSIS — E860 Dehydration: Principal | ICD-10-CM | POA: Diagnosis present

## 2023-11-10 DIAGNOSIS — G4733 Obstructive sleep apnea (adult) (pediatric): Secondary | ICD-10-CM | POA: Diagnosis present

## 2023-11-10 DIAGNOSIS — R918 Other nonspecific abnormal finding of lung field: Secondary | ICD-10-CM | POA: Diagnosis present

## 2023-11-10 DIAGNOSIS — R7401 Elevation of levels of liver transaminase levels: Secondary | ICD-10-CM | POA: Diagnosis not present

## 2023-11-10 DIAGNOSIS — R630 Anorexia: Secondary | ICD-10-CM | POA: Diagnosis present

## 2023-11-10 DIAGNOSIS — M8580 Other specified disorders of bone density and structure, unspecified site: Secondary | ICD-10-CM | POA: Diagnosis present

## 2023-11-10 LAB — COMPREHENSIVE METABOLIC PANEL
ALT: 171 U/L — ABNORMAL HIGH (ref 0–44)
AST: 112 U/L — ABNORMAL HIGH (ref 15–41)
Albumin: 3.6 g/dL (ref 3.5–5.0)
Alkaline Phosphatase: 59 U/L (ref 38–126)
Anion gap: 14 (ref 5–15)
BUN: 14 mg/dL (ref 8–23)
CO2: 25 mmol/L (ref 22–32)
Calcium: 9.6 mg/dL (ref 8.9–10.3)
Chloride: 95 mmol/L — ABNORMAL LOW (ref 98–111)
Creatinine, Ser: 0.79 mg/dL (ref 0.44–1.00)
GFR, Estimated: >60 mL/min (ref >60)
Glucose, Bld: 97 mg/dL (ref 70–99)
Potassium: 3.5 mmol/L (ref 3.5–5.1)
Sodium: 134 mmol/L — ABNORMAL LOW (ref 135–145)
Total Bilirubin: 1.3 mg/dL — ABNORMAL HIGH (ref 0.0–1.2)
Total Protein: 7.7 g/dL (ref 6.5–8.1)

## 2023-11-10 LAB — BLOOD GAS, VENOUS: Patient temperature: 37 *Deleted

## 2023-11-10 LAB — CBC
HCT: 33 % — ABNORMAL LOW (ref 36.0–46.0)
Hemoglobin: 11.2 g/dL — ABNORMAL LOW (ref 12.0–15.0)
MCH: 29.9 pg (ref 26.0–34.0)
MCHC: 33.9 g/dL (ref 30.0–36.0)
MCV: 88 fL (ref 80.0–100.0)
Platelets: 257 10*3/uL (ref 150–400)
RBC: 3.75 MIL/uL — ABNORMAL LOW (ref 3.87–5.11)
RDW: 13 % (ref 11.5–15.5)
WBC: 11.7 10*3/uL — ABNORMAL HIGH (ref 4.0–10.5)
nRBC: 0 % (ref 0.0–0.2)

## 2023-11-10 LAB — CK: Total CK: 17 U/L — ABNORMAL LOW (ref 38–234)

## 2023-11-10 LAB — LIPASE, BLOOD: Lipase: 30 U/L (ref 11–51)

## 2023-11-10 LAB — TROPONIN I (HIGH SENSITIVITY): Troponin I (High Sensitivity): 93 ng/L — ABNORMAL HIGH (ref <18)

## 2023-11-10 MED ORDER — MORPHINE SULFATE (PF) 4 MG/ML IV SOLN
4.0000 mg | Freq: Once | INTRAVENOUS | Status: AC
  Administered 2023-11-10: 4 mg via INTRAVENOUS
  Filled 2023-11-10: qty 1

## 2023-11-10 MED ORDER — APIXABAN 5 MG PO TABS: 5.0000 mg | ORAL_TABLET | Freq: Two times a day (BID) | ORAL | 1 refills | Status: DC

## 2023-11-10 MED ORDER — SODIUM CHLORIDE 0.9 % IV BOLUS
1000.0000 mL | Freq: Once | INTRAVENOUS | Status: AC
  Administered 2023-11-10: 1000 mL via INTRAVENOUS

## 2023-11-10 MED ORDER — IOHEXOL 300 MG/ML  SOLN
100.0000 mL | Freq: Once | INTRAMUSCULAR | Status: AC | PRN
  Administered 2023-11-10: 100 mL via INTRAVENOUS

## 2023-11-10 MED ORDER — ONDANSETRON HCL 4 MG/2ML IJ SOLN
4.0000 mg | Freq: Once | INTRAMUSCULAR | Status: AC
  Administered 2023-11-10: 4 mg via INTRAVENOUS
  Filled 2023-11-10: qty 2

## 2023-11-10 MED ORDER — LEVOFLOXACIN IN D5W 750 MG/150ML IV SOLN
750.0000 mg | Freq: Once | INTRAVENOUS | Status: AC
  Administered 2023-11-10: 750 mg via INTRAVENOUS
  Filled 2023-11-10: qty 150

## 2023-11-10 NOTE — ED Triage Notes (Addendum)
 Pt to triage via wheelchair.  Pt reports vomiting x 3 today, diarrhea x 2.  No abd pain no chest pain no sob.  Pt on 2 liters oxygen [place] for copd.  Pt has tremors.  Pt alert  speech clear.
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
## 2023-11-10 NOTE — Telephone Encounter (Signed)
 Eliquis 5mg  refill request received. Patient is 68 years old, weight-85kg, Crea- 0.69 on 06/08/23, Diagnosis-Afib, and last seen by Dr. Azucena Cecil on 01/23/23. Dose is appropriate based on dosing criteria. Will send in refill to requested pharmacy.

## 2023-11-10 NOTE — ED Provider Notes (Signed)
 [facility] Provider Note    Event Date/Time   First MD Initiated Contact with Patient 11/10/23 2144     (approximate)   History   Emesis   HPI  Debra [name] is a 68 y.o. female past medical history significant for breast cancer, COPD on chronic 3 L of home oxygen, who presents to the emergency department with nausea and vomiting.  Patient has been having nausea and vomiting for the past week and a half.  States that she has been lying in bed and not been able to eat or drink anything.  States that she has not had any solid food over the past week.  Does not have any antiemetics at home.  Not currently undergoing any chemotherapy.  Intermittent episodes of abdominal pain and bodyaches.  Denies fever.  No significant diarrhea.  No dysuria, urinary urgency or frequency.  No prior abdominal surgery.  Decreased urine output.     Physical Exam   Triage Vital Signs: ED Triage Vitals  Encounter Vitals Group     BP 11/10/23 2015 129/76     Systolic BP Percentile --      Diastolic BP Percentile --      Pulse Rate 11/10/23 2015 100     Resp 11/10/23 2015 20     Temp 11/10/23 2015 97.9 F (36.6 C)     Temp Source 11/10/23 2015 Oral     SpO2 11/10/23 2015 95 %     Weight 11/10/23 2016 180 lb (81.6 kg)     Height 11/10/23 2016 5\' 4"  (1.626 m)     Head Circumference --      Peak Flow --      Pain Score 11/10/23 2016 0     Pain Loc --      Pain Education --      Exclude from Growth Chart --     Most recent vital signs: Vitals:   11/10/23 2015 11/10/23 2300  BP: 129/76 122/72  Pulse: 100 94  Resp: 20 18  Temp: 97.9 F (36.6 C)   SpO2: 95% 96%    Physical Exam Constitutional:      Appearance: She is well-developed.  HENT:     Head: Atraumatic.     Mouth/Throat:     Mouth: Mucous membranes are dry.  Eyes:     Conjunctiva/sclera: Conjunctivae normal.  Cardiovascular:     Rate and Rhythm: Regular rhythm.  Pulmonary:     Effort: No respiratory  distress.  Abdominal:     General: There is no distension.     Tenderness: There is abdominal tenderness (Mild diffuse abdominal tenderness to palpation).  Musculoskeletal:        General: Normal range of motion.     Cervical back: Normal range of motion.  Skin:    General: Skin is warm.  Neurological:     Mental Status: She is alert. Mental status is at baseline.     IMPRESSION / MDM / ASSESSMENT AND PLAN / ED COURSE  I reviewed the triage vital signs and the nursing notes.  Differential diagnosis including viral gastroenteritis, dehydration, electrolyte abnormality, intra-abdominal abscess, urinary tract infection, ACS, rhabdomyolysis, viral illness including COVID/influenza    No tachycardic or bradycardic dysrhythmias while on cardiac telemetry.  RADIOLOGY I independently reviewed imaging, my interpretation of imaging: Questionable pneumonia to the right lower lung.  CT scan read as multiple scattered lesions to the liver largest measuring 1.3 cm.  Gallbladder was not visualized likely  surgically absent.  Mild intrahepatic bile duct dilation.  Pancreas unremarkable.  Nodule and adrenal gland.  Noted focal area concerning for right pleural effusion concerning for pneumonia.  LABS (all labs ordered are listed, but only abnormal results are displayed) Labs interpreted as -    Labs Reviewed  COMPREHENSIVE METABOLIC PANEL - Abnormal; Notable for the following components:      Result Value   Sodium 134 (*)    Chloride 95 (*)    AST 112 (*)    ALT 171 (*)    Total Bilirubin 1.3 (*)    All other components within normal limits  CBC - Abnormal; Notable for the following components:   WBC 11.7 (*)    RBC 3.75 (*)    Hemoglobin 11.2 (*)    HCT 33.0 (*)    All other components within normal limits  CK - Abnormal; Notable for the following components:   Total CK 17 (*)    All other components within normal limits  BLOOD GAS, VENOUS - Abnormal; Notable for the following  components:   Bicarbonate 31.6 (*)    Acid-Base Excess 5.5 (*)    All other components within normal limits  TROPONIN I (HIGH SENSITIVITY) - Abnormal; Notable for the following components:   Troponin I (High Sensitivity) 93 (*)    All other components within normal limits  RESP PANEL BY RT-PCR (RSV, FLU A&B, COVID)  RVPGX2  CULTURE, BLOOD (ROUTINE X 2)  CULTURE, BLOOD (ROUTINE X 2)  LIPASE, BLOOD  URINALYSIS, W/ REFLEX TO CULTURE (INFECTION SUSPECTED)  LACTIC ACID, PLASMA  LACTIC ACID, PLASMA     MDM  Patient with mild leukocytosis.  Anemia but hemoglobin appears stable.  Creatinine is at her baseline.  No significant electrolyte abnormality.  Mild elevation of LFTs.  Lipase within normal limits.  No significant hypercarbia.  Troponin and CK in process.  Given IV fluids, antiemetics, pain medication  CT scan currently pending.  Likely will need admission for intractable nausea and vomiting.  Multiple episodes of vomiting while in the emergency department.  CT scan and lab work pending.  Care transferred to incoming provider.   Did not want to delay antibiotics for blood culture given could be detrimental to the patient, given IV antibiotics and blood cultures immediately obtained and lactic acid obtained.  CT scan concerning for right lower lobe pneumonia.  Likely aspiration pneumonia given her significant nausea and vomiting.  Cephalosporin allergy noted in her chart.  Will give IV Levaquin to treat pneumonia.  Consulted hospitalist for admission.     PROCEDURES:  Critical Care performed: yes  .Critical Care  Performed by: Corena Herter, MD Authorized by: Corena Herter, MD   Critical care provider statement:    Critical care time (minutes):  30   Critical care was necessary to treat or prevent imminent or life-threatening deterioration of the following conditions:  Sepsis and dehydration   Critical care was time spent personally by me on the following activities:   Development of treatment plan with patient or surrogate, discussions with consultants, evaluation of patient's response to treatment, examination of patient, ordering and review of laboratory studies, ordering and review of radiographic studies, ordering and performing treatments and interventions, pulse oximetry, re-evaluation of patient's condition and review of old charts   Care discussed with: admitting provider     Patient's presentation is most consistent with acute presentation with potential threat to life or bodily function.   MEDICATIONS ORDERED IN ED: Medications  levofloxacin (LEVAQUIN)  IVPB 750 mg (750 mg Intravenous New Bag/Given 11/10/23 2351)  sodium chloride 0.9 % bolus 1,000 mL (1,000 mLs Intravenous New Bag/Given 11/10/23 2303)  ondansetron (ZOFRAN) injection 4 mg (4 mg Intravenous Given 11/10/23 2304)  morphine (PF) 4 MG/ML injection 4 mg (4 mg Intravenous Given 11/10/23 2304)  iohexol (OMNIPAQUE) 300 MG/ML solution 100 mL (100 mLs Intravenous Contrast Given 11/10/23 2316)    FINAL CLINICAL IMPRESSION(S) / ED DIAGNOSES   Final diagnoses:  Dehydration  Nausea vomiting and diarrhea  Pneumonia of right lower lobe due to infectious organism     Rx / DC Orders   ED Discharge Orders     None        Note:  This document was prepared using Dragon voice recognition software and may include unintentional dictation errors.   Corena Herter, MD 11/10/23 1610    Corena Herter, MD 11/11/23 9604    Corena Herter, MD 11/11/23 5409

## 2023-11-11 ENCOUNTER — Inpatient Hospital Stay

## 2023-11-11 ENCOUNTER — Other Ambulatory Visit: Payer: Self-pay

## 2023-11-11 ENCOUNTER — Inpatient Hospital Stay (HOSPITAL_COMMUNITY)
Admit: 2023-11-11 | Discharge: 2023-11-11 | Disposition: A | Discharge status: Home / Self Care | Attending: Obstetrics and Gynecology | Admitting: Obstetrics and Gynecology

## 2023-11-11 DIAGNOSIS — R7989 Other specified abnormal findings of blood chemistry: Secondary | ICD-10-CM

## 2023-11-11 DIAGNOSIS — K838 Other specified diseases of biliary tract: Secondary | ICD-10-CM | POA: Diagnosis not present

## 2023-11-11 DIAGNOSIS — E669 Obesity, unspecified: Secondary | ICD-10-CM | POA: Insufficient documentation

## 2023-11-11 DIAGNOSIS — K769 Liver disease, unspecified: Secondary | ICD-10-CM | POA: Diagnosis not present

## 2023-11-11 DIAGNOSIS — Z853 Personal history of malignant neoplasm of breast: Secondary | ICD-10-CM

## 2023-11-11 DIAGNOSIS — R7401 Elevation of levels of liver transaminase levels: Secondary | ICD-10-CM

## 2023-11-11 DIAGNOSIS — J449 Chronic obstructive pulmonary disease, unspecified: Secondary | ICD-10-CM | POA: Insufficient documentation

## 2023-11-11 DIAGNOSIS — R112 Nausea with vomiting, unspecified: Secondary | ICD-10-CM

## 2023-11-11 DIAGNOSIS — I480 Paroxysmal atrial fibrillation: Secondary | ICD-10-CM

## 2023-11-11 DIAGNOSIS — Z87891 Personal history of nicotine dependence: Secondary | ICD-10-CM

## 2023-11-11 LAB — BASIC METABOLIC PANEL
Anion gap: 10 (ref 5–15)
BUN: 13 mg/dL (ref 8–23)
CO2: 24 mmol/L (ref 22–32)
Calcium: 8.6 mg/dL — ABNORMAL LOW (ref 8.9–10.3)
Chloride: 100 mmol/L (ref 98–111)
Creatinine, Ser: 0.77 mg/dL (ref 0.44–1.00)
GFR, Estimated: >60 mL/min (ref >60)
Glucose, Bld: 85 mg/dL (ref 70–99)
Potassium: 3.7 mmol/L (ref 3.5–5.1)
Sodium: 134 mmol/L — ABNORMAL LOW (ref 135–145)

## 2023-11-11 LAB — URINALYSIS, W/ REFLEX TO CULTURE (INFECTION SUSPECTED)
Bacteria, UA: NONE SEEN
Bilirubin Urine: NEGATIVE
Glucose, UA: NEGATIVE mg/dL
Ketones, ur: 20 mg/dL — AB
Leukocytes,Ua: NEGATIVE
Nitrite: NEGATIVE
Protein, ur: NEGATIVE mg/dL
Specific Gravity, Urine: >1.046 — ABNORMAL HIGH (ref 1.005–1.030)
pH: 5 (ref 5.0–8.0)

## 2023-11-11 LAB — CBC
HCT: 31 % — ABNORMAL LOW (ref 36.0–46.0)
Hemoglobin: 10.3 g/dL — ABNORMAL LOW (ref 12.0–15.0)
MCH: 30.6 pg (ref 26.0–34.0)
MCHC: 33.2 g/dL (ref 30.0–36.0)
MCV: 92 fL (ref 80.0–100.0)
Platelets: 251 10*3/uL (ref 150–400)
RBC: 3.37 MIL/uL — ABNORMAL LOW (ref 3.87–5.11)
RDW: 13 % (ref 11.5–15.5)
WBC: 11.8 10*3/uL — ABNORMAL HIGH (ref 4.0–10.5)
nRBC: 0 % (ref 0.0–0.2)

## 2023-11-11 LAB — LACTIC ACID, PLASMA
Lactic Acid, Venous: 0.9 mmol/L (ref 0.5–1.9)
Lactic Acid, Venous: 1 mmol/L (ref 0.5–1.9)

## 2023-11-11 LAB — HIV ANTIBODY (ROUTINE TESTING W REFLEX): HIV Screen 4th Generation wRfx: NONREACTIVE

## 2023-11-11 LAB — HEPATIC FUNCTION PANEL
ALT: 132 U/L — ABNORMAL HIGH (ref 0–44)
AST: 85 U/L — ABNORMAL HIGH (ref 15–41)
Albumin: 2.8 g/dL — ABNORMAL LOW (ref 3.5–5.0)
Alkaline Phosphatase: 49 U/L (ref 38–126)
Bilirubin, Direct: 0.2 mg/dL (ref 0.0–0.2)
Indirect Bilirubin: 0.8 mg/dL (ref 0.3–0.9)
Total Bilirubin: 1 mg/dL (ref 0.0–1.2)
Total Protein: 5.9 g/dL — ABNORMAL LOW (ref 6.5–8.1)

## 2023-11-11 LAB — RESP PANEL BY RT-PCR (RSV, FLU A&B, COVID)  RVPGX2
Influenza A by PCR: NEGATIVE
Influenza B by PCR: NEGATIVE
Resp Syncytial Virus by PCR: NEGATIVE
SARS Coronavirus 2 by RT PCR: NEGATIVE

## 2023-11-11 LAB — TROPONIN I (HIGH SENSITIVITY): Troponin I (High Sensitivity): 94 ng/L — ABNORMAL HIGH (ref <18)

## 2023-11-11 LAB — PROCALCITONIN: Procalcitonin: <0.1 ng/mL

## 2023-11-11 LAB — HEPATITIS PANEL, ACUTE
HCV Ab: NONREACTIVE
Hep A IgM: NONREACTIVE
Hep B C IgM: NONREACTIVE
Hepatitis B Surface Ag: NONREACTIVE

## 2023-11-11 LAB — BLOOD GAS, VENOUS
Bicarbonate: 31.6 mmol/L — ABNORMAL HIGH (ref 20.0–28.0)
Patient temperature: 37 mmol/L — ABNORMAL HIGH (ref 0.0–2.0)
pCO2, Ven: 51 mmHg (ref 44–60)
pH, Ven: 7.4 (ref 7.25–7.43)
pO2, Ven: 31.6 mmol/L — ABNORMAL HIGH (ref 32–45)

## 2023-11-11 LAB — BRAIN NATRIURETIC PEPTIDE: B Natriuretic Peptide: 77.5 pg/mL (ref 0.0–100.0)

## 2023-11-11 MED ORDER — SODIUM CHLORIDE 0.9 % IV SOLN: INTRAVENOUS | Status: DC

## 2023-11-11 MED ORDER — SODIUM CHLORIDE 0.9 % IV SOLN
500.0000 mg | INTRAVENOUS | Status: DC
  Filled 2023-11-11: qty 5

## 2023-11-11 MED ORDER — STROKE: EARLY STAGES OF RECOVERY BOOK: Freq: Once | Status: AC

## 2023-11-11 MED ORDER — GUAIFENESIN ER 600 MG PO TB12
600.0000 mg | ORAL_TABLET | Freq: Two times a day (BID) | ORAL | Status: DC
  Administered 2023-11-11 – 2023-11-15 (×7): 600 mg via ORAL
  Filled 2023-11-11 (×11): qty 1

## 2023-11-11 MED ORDER — PANTOPRAZOLE SODIUM 40 MG IV SOLR
40.0000 mg | Freq: Two times a day (BID) | INTRAVENOUS | Status: DC
  Administered 2023-11-11 – 2023-11-13 (×5): 40 mg via INTRAVENOUS
  Filled 2023-11-11 (×5): qty 10

## 2023-11-11 MED ORDER — ROFLUMILAST 500 MCG PO TABS
500.0000 ug | ORAL_TABLET | Freq: Every day | ORAL | Status: DC
  Administered 2023-11-11 – 2023-11-16 (×6): 500 ug via ORAL
  Filled 2023-11-11 (×6): qty 1

## 2023-11-11 MED ORDER — ACETAMINOPHEN 325 MG PO TABS
650.0000 mg | ORAL_TABLET | Freq: Four times a day (QID) | ORAL | Status: DC | PRN
  Administered 2023-11-11 – 2023-11-13 (×5): 650 mg via ORAL
  Filled 2023-11-11 (×5): qty 2

## 2023-11-11 MED ORDER — PREGABALIN 50 MG PO CAPS
150.0000 mg | ORAL_CAPSULE | Freq: Two times a day (BID) | ORAL | Status: DC
  Administered 2023-11-11 – 2023-11-16 (×11): 150 mg via ORAL
  Filled 2023-11-11 (×11): qty 3

## 2023-11-11 MED ORDER — RISAQUAD PO CAPS
1.0000 | ORAL_CAPSULE | Freq: Every day | ORAL | Status: DC
  Administered 2023-11-11 – 2023-11-16 (×6): 1 via ORAL
  Filled 2023-11-11 (×7): qty 1

## 2023-11-11 MED ORDER — HYDROCOD POLI-CHLORPHE POLI ER 10-8 MG/5ML PO SUER: 5.0000 mL | Freq: Two times a day (BID) | ORAL | Status: DC | PRN

## 2023-11-11 MED ORDER — IOHEXOL 300 MG/ML  SOLN
75.0000 mL | Freq: Once | INTRAMUSCULAR | Status: AC | PRN
  Administered 2023-11-11: 75 mL via INTRAVENOUS

## 2023-11-11 MED ORDER — SODIUM CHLORIDE 0.9 % IV SOLN
3.0000 g | Freq: Four times a day (QID) | INTRAVENOUS | Status: DC
  Administered 2023-11-11: 3 g via INTRAVENOUS
  Filled 2023-11-11 (×3): qty 8

## 2023-11-11 MED ORDER — ONDANSETRON HCL 4 MG PO TABS: 4.0000 mg | ORAL_TABLET | Freq: Four times a day (QID) | ORAL | Status: DC | PRN

## 2023-11-11 MED ORDER — TAMOXIFEN CITRATE 10 MG PO TABS
20.0000 mg | ORAL_TABLET | Freq: Every day | ORAL | Status: DC
  Administered 2023-11-11: 20 mg via ORAL
  Filled 2023-11-11 (×2): qty 2

## 2023-11-11 MED ORDER — MAGNESIUM HYDROXIDE 400 MG/5ML PO SUSP: 30.0000 mL | Freq: Every day | ORAL | Status: DC | PRN

## 2023-11-11 MED ORDER — CYANOCOBALAMIN 500 MCG PO TABS
1000.0000 ug | ORAL_TABLET | Freq: Every day | ORAL | Status: DC
  Administered 2023-11-11 – 2023-11-16 (×6): 1000 ug via ORAL
  Filled 2023-11-11 (×6): qty 2

## 2023-11-11 MED ORDER — CITALOPRAM HYDROBROMIDE 20 MG PO TABS
10.0000 mg | ORAL_TABLET | Freq: Every day | ORAL | Status: DC
  Administered 2023-11-11 – 2023-11-16 (×6): 10 mg via ORAL
  Filled 2023-11-11 (×7): qty 1

## 2023-11-11 MED ORDER — LACTATED RINGERS IV BOLUS
1000.0000 mL | Freq: Once | INTRAVENOUS | Status: AC
  Administered 2023-11-11: 1000 mL via INTRAVENOUS

## 2023-11-11 MED ORDER — ASPIRIN 325 MG PO TABS
325.0000 mg | ORAL_TABLET | Freq: Every day | ORAL | Status: DC
  Filled 2023-11-11: qty 1

## 2023-11-11 MED ORDER — IOHEXOL 350 MG/ML SOLN
75.0000 mL | Freq: Once | INTRAVENOUS | Status: AC | PRN
  Administered 2023-11-11: 75 mL via INTRAVENOUS

## 2023-11-11 MED ORDER — UMECLIDINIUM BROMIDE 62.5 MCG/ACT IN AEPB
1.0000 | INHALATION_SPRAY | Freq: Every day | RESPIRATORY_TRACT | Status: DC
  Administered 2023-11-11 – 2023-11-16 (×6): 1 via RESPIRATORY_TRACT
  Filled 2023-11-11: qty 7

## 2023-11-11 MED ORDER — APIXABAN 5 MG PO TABS
5.0000 mg | ORAL_TABLET | Freq: Two times a day (BID) | ORAL | Status: DC
Start: 2023-11-11 — End: 2023-11-11
  Administered 2023-11-11 (×2): 5 mg via ORAL
  Filled 2023-11-11 (×2): qty 1

## 2023-11-11 MED ORDER — OXYCODONE HCL 5 MG PO TABS
5.0000 mg | ORAL_TABLET | Freq: Four times a day (QID) | ORAL | Status: DC | PRN
  Administered 2023-11-11 – 2023-11-12 (×6): 5 mg via ORAL
  Filled 2023-11-11 (×7): qty 1

## 2023-11-11 MED ORDER — DILTIAZEM HCL ER COATED BEADS 120 MG PO CP24
120.0000 mg | ORAL_CAPSULE | Freq: Every day | ORAL | Status: DC
  Administered 2023-11-11 – 2023-11-16 (×6): 120 mg via ORAL
  Filled 2023-11-11 (×7): qty 1

## 2023-11-11 MED ORDER — TRAZODONE HCL 50 MG PO TABS
25.0000 mg | ORAL_TABLET | Freq: Every evening | ORAL | Status: DC | PRN
  Administered 2023-11-13: 25 mg via ORAL
  Filled 2023-11-11 (×3): qty 1

## 2023-11-11 MED ORDER — FLUTICASONE FUROATE-VILANTEROL 100-25 MCG/ACT IN AEPB
1.0000 | INHALATION_SPRAY | Freq: Every day | RESPIRATORY_TRACT | Status: DC
  Administered 2023-11-11 – 2023-11-16 (×5): 1 via RESPIRATORY_TRACT
  Filled 2023-11-11 (×2): qty 28

## 2023-11-11 MED ORDER — TIOTROPIUM BROMIDE MONOHYDRATE 18 MCG IN CAPS
18.0000 ug | ORAL_CAPSULE | Freq: Every day | RESPIRATORY_TRACT | Status: DC
  Filled 2023-11-11: qty 1

## 2023-11-11 MED ORDER — ACETAMINOPHEN 650 MG RE SUPP: 650.0000 mg | Freq: Four times a day (QID) | RECTAL | Status: DC | PRN

## 2023-11-11 MED ORDER — ONDANSETRON HCL 4 MG/2ML IJ SOLN: 4.0000 mg | Freq: Four times a day (QID) | INTRAMUSCULAR | Status: DC | PRN

## 2023-11-11 MED ORDER — APIXABAN 5 MG PO TABS
5.0000 mg | ORAL_TABLET | Freq: Two times a day (BID) | ORAL | Status: DC
  Administered 2023-11-11 – 2023-11-12 (×2): 5 mg via ORAL
  Filled 2023-11-11 (×2): qty 1

## 2023-11-11 MED ORDER — POTASSIUM CHLORIDE IN NACL 20-0.9 MEQ/L-% IV SOLN
INTRAVENOUS | Status: DC
  Filled 2023-11-11 (×4): qty 1000

## 2023-11-11 MED ORDER — GADOBUTROL 1 MMOL/ML IV SOLN
8.0000 mL | Freq: Once | INTRAVENOUS | Status: AC | PRN
  Administered 2023-11-11: 8 mL via INTRAVENOUS

## 2023-11-11 NOTE — Plan of Care (Signed)
?  Problem: Safety: ?Goal: Ability to remain free from injury will improve ?Outcome: Progressing ?  ?Problem: Respiratory: ?Goal: Ability to maintain adequate ventilation will improve ?Outcome: Progressing ?Goal: Ability to maintain a clear airway will improve ?Outcome: Progressing ?  ?

## 2023-11-11 NOTE — Assessment & Plan Note (Signed)
-   We will continue with nebulizer therapy and Spiriva holding off long-acting beta agonist for now.. - Her Daliresp will be continued for pulmonary hypertension.
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
## 2023-11-11 NOTE — Assessment & Plan Note (Addendum)
-   This could be related to  viral gastritis. - She will place on antiemetics as well as IV PPI therapy. - She will be hydrated with IV normal saline. - We will monitor and optimize her electrolytes.

## 2023-11-11 NOTE — Assessment & Plan Note (Addendum)
-   We will continue Eliquis and Cardizem CD. - Will check an EKG.

## 2023-11-11 NOTE — Assessment & Plan Note (Signed)
-   Will continue tamoxifen.
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
## 2023-11-11 NOTE — Progress Notes (Signed)
 Plan of care note:  Primary team had requested assistance regarding stroke workup. MRI Brain with embolic appearing strokes, noted scattered L MCA stroke. Patient has a right facial droop and slurred speech, no aphasia, no R sided weakness. CTA obtained shows L MCA mid M2 occlusion with irregular distal reconstitution. LKW is unknown but suspected to be a couple days ago.  Plan: - in addition to stroke workup and neurology consult tomorrow AM, recommend following. - Head of bed flat. - strict bed rest - fluids at 125cc/hr. - Q2H neuro checks with NIHSS. - reach out to me with any questions overnight.  These recommendations were conveyed to Manuela Schwartz NP over secure chat.  Erick Blinks [facility]

## 2023-11-11 NOTE — Progress Notes (Signed)
       CROSS COVER NOTE  NAME: AZAYLAH STAILEY MRN: [id number] DOB : 08-Nov-1955 ATTENDING PHYSICIAN: Kathrynn Running, MD    Date of Service   11/11/2023   HPI/Events of Note   Notified by [facility] radiology of following MRI report IMPRESSION: 1. 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 Probable additional punctate acute infarct in the inferior left cerebellum.  Interventions   Assessment/Plan: Stroke care orders initiated - sidebar discussion  with Dr Tad Moore CT head and neck ordered Resumed eliquis Statin contraindicated secondary to transaminitis Formal neuro consult placed for Dr Selina Cooley and staff message sent        Donnie Mesa NP [facility] Cross Cover 7pm-7am - check amion for availability Pager [phone]
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
## 2023-11-11 NOTE — Assessment & Plan Note (Signed)
-   This likely secondary to her intractable nausea and vomiting. - The patient will be admitted to a medical telemetry bed. - Will continue antibiotic therapy with IV Unasyn and Zithromax. - Mucolytic therapy be provided as well as duo nebs q.i.d. and q.4 hours p.r.n. - We will follow blood cultures.

## 2023-11-12 ENCOUNTER — Other Ambulatory Visit: Payer: Self-pay | Admitting: Neurosurgery

## 2023-11-12 DIAGNOSIS — D6859 Other primary thrombophilia: Secondary | ICD-10-CM

## 2023-11-12 DIAGNOSIS — I671 Cerebral aneurysm, nonruptured: Secondary | ICD-10-CM

## 2023-11-12 DIAGNOSIS — R112 Nausea with vomiting, unspecified: Secondary | ICD-10-CM | POA: Diagnosis not present

## 2023-11-12 DIAGNOSIS — I639 Cerebral infarction, unspecified: Secondary | ICD-10-CM

## 2023-11-12 DIAGNOSIS — C50919 Malignant neoplasm of unspecified site of unspecified female breast: Secondary | ICD-10-CM

## 2023-11-12 DIAGNOSIS — Z1731 Human epidermal growth factor receptor 2 positive status: Secondary | ICD-10-CM

## 2023-11-12 DIAGNOSIS — K769 Liver disease, unspecified: Secondary | ICD-10-CM

## 2023-11-12 DIAGNOSIS — D649 Anemia, unspecified: Secondary | ICD-10-CM

## 2023-11-12 LAB — ECHOCARDIOGRAM COMPLETE
Area-P 1/2: 3.65 cm2
Height: 64 in
MV M vel: 4.54 m/s
MV Peak grad: 82.4 mmHg
S' Lateral: 3.1 cm
Weight: 2880 [oz_av]

## 2023-11-12 LAB — GLUCOSE, CAPILLARY: Glucose-Capillary: 94 mg/dL (ref 70–99)

## 2023-11-12 LAB — IRON AND TIBC
Iron: 65 ug/dL (ref 28–170)
Saturation Ratios: 25 % (ref 10.4–31.8)
TIBC: 256 ug/dL (ref 250–450)
UIBC: 191 ug/dL

## 2023-11-12 LAB — RETIC PANEL
Immature Retic Fract: 14.1 % (ref 2.3–15.9)
RBC.: 3.03 MIL/uL — ABNORMAL LOW (ref 3.87–5.11)
Retic Count, Absolute: 61.8 10*3/uL (ref 19.0–186.0)
Retic Ct Pct: 2 % (ref 0.4–3.1)
Reticulocyte Hemoglobin: 33.1 pg (ref >27.9)

## 2023-11-12 LAB — HEPARIN LEVEL (UNFRACTIONATED): Heparin Unfractionated: >1.1 [IU]/mL — ABNORMAL HIGH (ref 0.30–0.70)

## 2023-11-12 LAB — BASIC METABOLIC PANEL WITH GFR
Anion gap: 8 (ref 5–15)
BUN: 9 mg/dL (ref 8–23)
CO2: 26 mmol/L (ref 22–32)
Calcium: 8.5 mg/dL — ABNORMAL LOW (ref 8.9–10.3)
Chloride: 103 mmol/L (ref 98–111)
Creatinine, Ser: 0.93 mg/dL (ref 0.44–1.00)
GFR, Estimated: >60 mL/min (ref >60)
Glucose, Bld: 96 mg/dL (ref 70–99)
Potassium: 3.3 mmol/L — ABNORMAL LOW (ref 3.5–5.1)
Sodium: 137 mmol/L (ref 135–145)

## 2023-11-12 LAB — HEPATIC FUNCTION PANEL
ALT: 120 U/L — ABNORMAL HIGH (ref 0–44)
AST: 74 U/L — ABNORMAL HIGH (ref 15–41)
Albumin: 2.8 g/dL — ABNORMAL LOW (ref 3.5–5.0)
Alkaline Phosphatase: 46 U/L (ref 38–126)
Bilirubin, Direct: 0.1 mg/dL (ref 0.0–0.2)
Indirect Bilirubin: 0.6 mg/dL (ref 0.3–0.9)
Total Bilirubin: 0.7 mg/dL (ref 0.0–1.2)
Total Protein: 6 g/dL — ABNORMAL LOW (ref 6.5–8.1)

## 2023-11-12 LAB — CBC
HCT: 25.7 % — ABNORMAL LOW (ref 36.0–46.0)
Hemoglobin: 8.7 g/dL — ABNORMAL LOW (ref 12.0–15.0)
MCH: 30.3 pg (ref 26.0–34.0)
MCHC: 33.9 g/dL (ref 30.0–36.0)
MCV: 89.5 fL (ref 80.0–100.0)
Platelets: 202 10*3/uL (ref 150–400)
RBC: 2.87 MIL/uL — ABNORMAL LOW (ref 3.87–5.11)
RDW: 13 % (ref 11.5–15.5)
WBC: 9.4 10*3/uL (ref 4.0–10.5)
nRBC: 0 % (ref 0.0–0.2)

## 2023-11-12 LAB — MRSA NEXT GEN BY PCR, NASAL: MRSA by PCR Next Gen: NOT DETECTED

## 2023-11-12 LAB — LIPID PANEL
Cholesterol: 199 mg/dL (ref 0–200)
HDL: 36 mg/dL — ABNORMAL LOW (ref >40)
LDL Cholesterol: 131 mg/dL — ABNORMAL HIGH (ref 0–99)
Total CHOL/HDL Ratio: 5.5 ratio
Triglycerides: 159 mg/dL — ABNORMAL HIGH (ref <150)
VLDL: 32 mg/dL (ref 0–40)

## 2023-11-12 LAB — HEMOGLOBIN A1C
Hgb A1c MFr Bld: 4.6 % — ABNORMAL LOW (ref 4.8–5.6)
Mean Plasma Glucose: 85.32 mg/dL

## 2023-11-12 LAB — FERRITIN: Ferritin: 362 ng/mL — ABNORMAL HIGH (ref 11–307)

## 2023-11-12 LAB — APTT: aPTT: 26 s (ref 24–36)

## 2023-11-12 MED ORDER — POTASSIUM CHLORIDE CRYS ER 20 MEQ PO TBCR
40.0000 meq | EXTENDED_RELEASE_TABLET | Freq: Once | ORAL | Status: AC
  Administered 2023-11-12: 40 meq via ORAL
  Filled 2023-11-12: qty 2

## 2023-11-12 MED ORDER — HEPARIN (PORCINE) 25000 UT/250ML-% IV SOLN
1400.0000 [IU]/h | INTRAVENOUS | Status: DC
  Administered 2023-11-12: 1100 [IU]/h via INTRAVENOUS
  Administered 2023-11-13 – 2023-11-16 (×4): 1250 [IU]/h via INTRAVENOUS
  Filled 2023-11-12 (×5): qty 250

## 2023-11-12 MED ORDER — CHLORHEXIDINE GLUCONATE CLOTH 2 % EX PADS
6.0000 | MEDICATED_PAD | Freq: Every day | CUTANEOUS | Status: DC
  Administered 2023-11-12: 6 via TOPICAL

## 2023-11-12 MED ORDER — ENSURE ENLIVE PO LIQD
237.0000 mL | Freq: Three times a day (TID) | ORAL | Status: DC
  Administered 2023-11-12 – 2023-11-15 (×6): 237 mL via ORAL

## 2023-11-12 MED ORDER — SODIUM CHLORIDE 0.9% FLUSH
10.0000 mL | Freq: Two times a day (BID) | INTRAVENOUS | Status: DC
  Administered 2023-11-12: 20 mL
  Administered 2023-11-12 – 2023-11-14 (×5): 10 mL
  Administered 2023-11-15: 20 mL
  Administered 2023-11-15 – 2023-11-16 (×2): 10 mL

## 2023-11-12 NOTE — Progress Notes (Incomplete)
 [facility]  P/w n/v, malaise  CT angio Head and Neck with contrast(Personally reviewed): 1. 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 2. Approximately 2 mm medially directed outpouching arising from the right cavernous ICA, which could represent an infundibulum or aneurysm. 3. Please see same day CT chest for intrathoracic findings. 4. Approximately 2.3 cm left thyroid nodule. Recommend thyroid US (ref: J Am Coll Radiol. 2015 Feb;12(2): 143-50). 5. Aortic Atherosclerosis (ICD10-I70.0) and Emphysema (ICD10-J43.9).   MRI Brain wwo (Personally reviewed): 1. 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 Probable additional punctate acute infarct in the inferior left cerebellum. 3. No evidence of metastatic disease   Patient has hx breast cancer; new body imaging this admission: CT on admission showed liver lesions. Further w/u with MRI/MRCP and CT chest shows right breast density, mediastinal and right hilar lymphadenopathy, right lower lobe nodules, RLL airspace disease, trace right pleural effusion, liver nodules, thoracic and lumbar spine lesions, left adrenal gland nodule. All concerning for metastatic disease.    ASSESSMENT    Debra [name] is a 68 y.o. female ***   Given the small size of her infarcts and the fact that she is in a hypercoagulable state 2/2 malignancy I would recommend continuing her eliquis at this time (benefit in stroke risk reduction outweighs the risk of hemorrhagic conversion).   RECOMMENDATIONS    - Permissive HTN x48 hrs from sx onset goal BP <220/110. PRN labetalol or hydralazine if BP above these parameters. Avoid oral antihypertensives. - F/u TTE - Check A1c and LDL + add statin per guidelines - Continue eliquis; no neurologic indication for antiplatelet in addition to therapeutic anticoagulation - q4 hr neuro checks - STAT head CT for any change in neuro  exam - Tele - PT/OT/SLP - Stroke education - Amb referral to neurology upon discharge - Neurology will continue to follow

## 2023-11-12 NOTE — Consult Note (Addendum)
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 Signed   By: Feliberto Harts M.D.   On: 11/11/2023 23:37   Result Date: 11/11/2023 CLINICAL DATA:  Stroke/TIA, determine embolic source EXAM: CT ANGIOGRAPHY HEAD AND NECK WITH AND WITHOUT CONTRAST TECHNIQUE: Multidetector CT imaging of the head and neck was performed using the standard protocol during bolus administration of intravenous contrast. Multiplanar CT image reconstructions and MIPs were obtained to evaluate the vascular anatomy. Carotid stenosis measurements (when applicable) are obtained utilizing NASCET criteria, using the distal internal carotid diameter as the denominator. RADIATION DOSE REDUCTION: This exam was performed according to the departmental dose-optimization program which includes automated exposure control, adjustment of the mA and/or kV according to patient size and/or use of iterative reconstruction technique. CONTRAST:  75mL OMNIPAQUE IOHEXOL 350 MG/ML SOLN  COMPARISON:  Same day MRI head. FINDINGS: CTA NECK FINDINGS Aortic arch: Aortic atherosclerosis. 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 Great vessel origins are patent. Right carotid system: Atherosclerosis at the carotid bifurcation without greater than 50% stenosis. Left carotid system: Atherosclerosis at the carotid bifurcation without greater than 50% stenosis. Vertebral arteries: Codominant. No evidence of dissection, stenosis (50% or greater), or occlusion. Skeleton: No evidence of acute abnormality on limited assessment. Multilevel degenerative change. Other neck: Approximately 2.3 cm left thyroid nodule. Upper chest: Please see same day CT chest for intrathoracic findings. Emphysema. Review of the MIP images confirms the above findings CTA HEAD FINDINGS Anterior circulation: Bilateral intracranial ICAs are patent. Approximately 2 mm medially directed outpouching arising from the right cavernous ICA, which could represent an infundibulum or aneurysm. Bilateral M1 MCAs are patent. 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 Bilateral ACAs are patent without proximal hemodynamically significant stenosis. Posterior circulation: Bilateral intradural vertebral arteries, basilar artery and bilateral posterior cerebral arteries are patent without proximal hemodynamically significant stenosis. Venous sinuses: As permitted by contrast timing, patent. Review of the MIP images confirms the above findings The ordering provider has been paged at the time of dictation for call of report. IMPRESSION: 1. 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 2. Approximately 2 mm medially directed outpouching arising from the right cavernous ICA, which could represent an infundibulum or aneurysm. 3. Please see same day CT chest for intrathoracic findings. 4. Approximately 2.3 cm left thyroid nodule. Recommend thyroid US (ref: J Am Coll Radiol. 2015 Feb;12(2):  143-50). 5. Aortic Atherosclerosis (ICD10-I70.0) and Emphysema (ICD10-J43.9). Electronically Signed: By: Feliberto Harts M.D. On: 11/11/2023 23:14   MR BRAIN W WO CONTRAST Result Date: 11/11/2023 CLINICAL DATA:  right facial weakness, malignancy also on the ddx EXAM: MRI HEAD WITHOUT AND WITH CONTRAST TECHNIQUE: Multiplanar, multiecho  pulse sequences of the brain and surrounding structures were obtained without and with intravenous contrast. CONTRAST:  8mL GADAVIST GADOBUTROL 1 MMOL/ML IV SOLN COMPARISON:  CT head November 11, 2023. 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 Mild edema without mass effect. No midline shift. Probable additional punctate acute infarct in the inferior left cerebellum. No evidence of acute hemorrhage, mass lesion, or hydrocephalus. Vascular: Major arterial flow voids are maintained at the skull base. Skull and upper cervical spine: Normal marrow signal. Sinuses/Orbits: Clear sinuses.  No acute orbital findings. IMPRESSION: 1. 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 Probable additional punctate acute infarct in the inferior left cerebellum. These results will be called to the ordering clinician or representative by the Radiologist Assistant, and communication documented in the PACS or [employer]. Electronically Signed   By: Feliberto Harts M.D.   On: 11/11/2023 20:27   CT CHEST W CONTRAST Result Date: 11/11/2023 CLINICAL DATA:  Liver lesions with history of breast cancer. EXAM: CT CHEST WITH CONTRAST TECHNIQUE: Multidetector CT imaging of the chest was performed during intravenous contrast administration. RADIATION DOSE REDUCTION: This exam was performed according to the departmental dose-optimization program which includes automated exposure control, adjustment of the mA and/or kV according to patient size and/or use  of iterative reconstruction technique. CONTRAST:  75mL OMNIPAQUE IOHEXOL 300 MG/ML  SOLN COMPARISON:  CT abdomen and pelvis 11/10/2023. FINDINGS: Cardiovascular: Heart is borderline enlarged/mildly enlarged. Aorta is normal in size. There is no pericardial effusion. There are atherosclerotic calcifications of the aorta. Mediastinum/Nodes: There is a heterogeneous left thyroid nodule attaining cystic and solid components measuring 2.6 x 1.5 cm. There are multiple enlarged right paratracheal lymph nodes measuring up to 2.0 x 2.9 cm. Enlarged subcarinal lymph node measures 1.2 cm short axis. There is a prominent right lower esophageal lymph node measuring 8 mm short axis. There are multiple enlarged right hilar lymph nodes measuring up to 1.4 x 1.8 cm. Visualized esophagus is within normal limits. Lungs/Pleura: Mild emphysematous changes are present. There is scarring in both lung apices. Multifocal airspace disease is seen throughout the right lower lobe, predominantly posteriorly and peripherally. Some areas are slightly nodular for example superior segment measuring 3.2 by 2.0 cm. A small air-fluid level seen in this region. No other air-fluid levels are seen. Additional nodular density seen in the right lower lobe image 5/92 measuring 9 mm. Smaller nodules are seen scattered throughout the right lower lobe measuring 5 mm or less. There is a trace right pleural effusion. There is a right middle lobe nodule measuring 5 mm image 5/122. There are prominent intrapulmonary lymph nodes along the right major fissure. There are 2 mm nodular densities in the left upper lobe. Left lung is otherwise clear. Upper Abdomen: Subcentimeter hypodensities in the liver and left adrenal mass are unchanged. Please see CT abdomen and pelvis performed same day for further description. Musculoskeletal: There is anterior intramuscular chest wall edema on the right. Focal density is seen in the medial right breast extending to the skin  surface measuring 1.2 x 2.3 cm image 3/81. There some associated diffuse right breast skin thickening. No acute fracture. There is a single 5 mm sclerotic density in the T3 vertebral body. IMPRESSION: 1. Multifocal airspace disease throughout the right lower lobe, predominantly posteriorly and peripherally. Single area is slightly nodular in the superior segment containing an air-fluid level  worrisome for necrotic pneumonia or necrotic nodule. Findings may be infectious/inflammatory, but neoplasm not excluded. 2. Trace right pleural effusion. 3. Mediastinal and right hilar lymphadenopathy. 4. Additional right lower lobe and right middle lobe pulmonary nodules measuring 5 mm. 5. Left thyroid nodule measuring 2.6 cm. Recommend further evaluation with ultrasound. 6. Focal density in the medial right breast extending to the skin surface measuring 1.2 x 2.3 cm. Correlate with physical exam. 7. Single 5 mm sclerotic density in the T3 vertebral body. Aortic Atherosclerosis (ICD10-I70.0) and Emphysema (ICD10-J43.9). Electronically Signed   By: Darliss Cheney M.D.   On: 11/11/2023 17:53   CT HEAD WO CONTRAST ( ) Result Date: 11/11/2023 CLINICAL DATA:  Right facial weakness. EXAM: CT HEAD WITHOUT CONTRAST TECHNIQUE: Contiguous axial images were obtained from the base of the skull through the vertex without intravenous contrast. RADIATION DOSE REDUCTION: This exam was performed according to the departmental dose-optimization program which includes automated exposure control, adjustment of the mA and/or kV according to patient size and/or use of iterative reconstruction technique. COMPARISON:  Head CT 07/21/2022 FINDINGS: Brain: There are new small hypodensities in the left frontoparietal white matter at the level of the posterior centrum semiovale (series 3, image 20), and there is a new subcentimeter focus of cortical hypodensity at the level of the left parietal operculum (series 6, image 46), suspicious for recent  infarcts. No intracranial hemorrhage, mass, midline shift, or extra-axial fluid collection is identified. Cerebral volume is normal. The ventricles are normal in size. Vascular: No hyperdense vessel. Skull: No acute fracture or suspicious lesion. Sinuses/Orbits: Visualized paranasal sinuses and mastoid air cells are clear. Bilateral cataract extraction. Other: None. 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 A brain MRI is in progress and will provide further evaluation. Electronically Signed   By: Sebastian Ache M.D.   On: 11/11/2023 15:13   DG Chest 2 View Result Date: 11/10/2023 CLINICAL DATA:  dyspnea EXAM: CHEST - 2 VIEW COMPARISON:  Chest x-ray 07/21/2022, chest x-ray 02/15/2021 FINDINGS: The heart and mediastinal contours are unchanged. Prominent hilar vasculature. No focal consolidation. No pulmonary edema. No pleural effusion. No pneumothorax. No acute osseous abnormality. IMPRESSION: Prominent hilar vasculature suggestive of pulmonary venous congestion. Underlying lymph nodes not excluded. Electronically Signed   By: Tish Frederickson M.D.   On: 11/10/2023 23:50   CT ABDOMEN PELVIS W CONTRAST Result Date: 11/10/2023 CLINICAL DATA:  Acute nonlocalized abdominal pain. Vomiting and diarrhea. EXAM: CT ABDOMEN AND PELVIS WITH CONTRAST TECHNIQUE: Multidetector CT imaging of the abdomen and pelvis was performed using the standard protocol following bolus administration of intravenous contrast. RADIATION DOSE REDUCTION: This exam was performed according to the departmental dose-optimization program which includes automated exposure control, adjustment of the mA and/or kV according to patient size and/or use of iterative reconstruction technique. CONTRAST:  OMNIPAQUE IOHEXOL 300 MG/ML  SOLN COMPARISON:  None Available. FINDINGS: Lower chest: Focal consolidation suggested in the right lung base with small right pleural effusion. This may represent pneumonia  or less likely aspiration. Emphysematous changes in the lungs. Hepatobiliary: Multiple scattered low-attenuation lesions in the liver, most are subcentimeter in size. Largest is in the medial segment left lobe measuring 1.3 cm in diameter. Enhancement pattern is indeterminate. Consider follow-up MRI for further characterization. The gallbladder is not visualized, likely surgically absent. Mild intrahepatic bile duct dilatation is most likely due to postoperative change. No common duct stones are identified. Pancreas: Unremarkable. No pancreatic ductal dilatation or surrounding inflammatory changes. Spleen: Segmental low-attenuation  in the upper spleen likely represents a splenic infarct. Spleen size is normal. Adrenals/Urinary Tract: 1.3 cm diameter left adrenal gland nodule with density measurement of 69 Hounsfield units. Renal nephrograms are symmetrical. No solid mass identified. No hydronephrosis or hydroureter. Bladder is normal. Stomach/Bowel: Stomach is within normal limits. Appendix appears normal. No evidence of bowel wall thickening, distention, or inflammatory changes. Vascular/Lymphatic: Aortic atherosclerosis. No enlarged abdominal or pelvic lymph nodes. Reproductive: Uterus and bilateral adnexa are unremarkable. Other: No abdominal wall hernia or abnormality. No abdominopelvic ascites. Musculoskeletal: Postoperative fixation of the right hip. No acute bony abnormalities. IMPRESSION: 1. Nonspecific low-attenuation lesions in the liver, largest measuring 1.3 cm diameter. Enhancement pattern is indeterminate. Consider follow-up MRI for further characterization. 2. Mild intrahepatic bile duct dilatation is likely postoperative in the setting of cholecystectomy. 3. Left adrenal mass measuring 1.3 cm, probable benign adenoma. Recommend follow-up adrenal washout CT in 1 year. If stable for = 1 year, no further follow-up imaging. JACR 2017 Aug; 14(8):1038-44, JCAT 2016 Mar-Apr; 40(2):194-200, Urol J 2006  Spring; 3(2):71-4. 4. Aortic atherosclerosis. 5. Focal area of consolidation suggested in the right lung base with small right pleural effusion, likely pneumonia. Electronically Signed   By: Burman Nieves M.D.   On: 11/10/2023 23:30     Assessment and plan-   # Nausea vomiting diarrhea symptoms have resolved.  Transaminitis levels are improving.  # Acute stroke Patient has atrial fibrillation and has been on Eliquis 5 mg twice daily. Stroke is likely secondary to decreased Eliquis absorption due to her nausea vomiting diarrhea symptoms as well as possible hypercarbia state from recurrent malignancy. Appreciate neurology recommendation. PT OT  # Multiple liver lesions, suspect metastatic disease from breast cancer. Check tumor markers. Recommending liver lesion biopsy to minimize anticoagulation interruption.. Recommend to switch her anticoagulation from Eliquis to heparin gtt. Consult IR for evaluation.  # Multifocal airspace disease, mediastinal and right hilar lymphadenopathy, lung nodules.   Focal density in the medial right breast extending to the skin surface.  I do not see a physical examination finding correlates to the CT finding. Will obtain outpatient PET scan for evaluation.  # Normocytic anemia, trending down.  Patient has a chronic history of anemia. Check iron, TIBC ferritin, reticulocyte panel.  # Thyroid nodule 2.6 cm, outpatient workup. Plan was discussed with patient and her husband.  Also discussed with Dr. Ashok Pall.  Thank you for allowing me to participate in the care of this patient.   Rickard Patience, MD, PhD Hematology Oncology 11/12/2023

## 2023-11-12 NOTE — Progress Notes (Signed)
 Patient and family refused bed alarm at this time but was educated about its importance. Will continue to monitor.

## 2023-11-12 NOTE — Progress Notes (Signed)
 OT Cancellation Note  Patient Details Name: [name] MRN: [id number] DOB: 26-Nov-1955   Cancelled Treatment:    Reason Eval/Treat Not Completed: Other (comment) (per chart review and discussion with nurse, neuro note from 3/26 says strict bedrest, no neuro note in chart today. OT will follow up later this date/tomorrow as approrpiate.)  Oleta Mouse, OTD OTR/L  11/12/23, 11:51 AM

## 2023-11-12 NOTE — Progress Notes (Addendum)
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 Enlarged subcarinal lymph node measures 1.2 cm short axis. There is a prominent right lower esophageal lymph node measuring 8 mm short axis. There are multiple enlarged right hilar lymph nodes measuring up to 1.4 x 1.8 cm. Visualized esophagus is within normal limits. Lungs/Pleura: Mild emphysematous changes are present. There is scarring in both lung apices. Multifocal airspace disease is seen throughout the right lower lobe, predominantly posteriorly and peripherally. Some areas are slightly nodular for example superior segment measuring 3.2 by 2.0 cm. A small air-fluid level seen in this region. No other air-fluid levels are seen. Additional nodular density seen in the right lower lobe image 5/92 measuring 9 mm. Smaller  nodules are seen scattered throughout the right lower lobe measuring 5 mm or less. There is a trace right pleural effusion. There is a right middle lobe nodule measuring 5 mm image 5/122. There are prominent intrapulmonary lymph nodes along the right major fissure. There are 2 mm nodular densities in the left upper lobe. Left lung is otherwise clear. Upper Abdomen: Subcentimeter hypodensities in the liver and left adrenal mass are unchanged. Please see CT abdomen and pelvis performed same day for further description. Musculoskeletal: There is anterior intramuscular chest wall edema on the right. Focal density is seen in the medial right breast extending to the skin surface measuring 1.2 x 2.3 cm image 3/81. There some associated diffuse right breast skin thickening. No acute fracture. There is a single 5 mm sclerotic density in the T3 vertebral body. IMPRESSION: 1. Multifocal airspace disease throughout the right lower lobe, predominantly posteriorly and peripherally. Single area is slightly nodular in the superior segment containing an air-fluid level worrisome for necrotic pneumonia or necrotic nodule. Findings  may be infectious/inflammatory, but neoplasm not excluded. 2. Trace right pleural effusion. 3. Mediastinal and right hilar lymphadenopathy. 4. Additional right lower lobe and right middle lobe pulmonary nodules measuring 5 mm. 5. Left thyroid nodule measuring 2.6 cm. Recommend further evaluation with ultrasound. 6. Focal density in the medial right breast extending to the skin surface measuring 1.2 x 2.3 cm. Correlate with physical exam. 7. Single 5 mm sclerotic density in the T3 vertebral body. Aortic Atherosclerosis (ICD10-I70.0) and Emphysema (ICD10-J43.9). Electronically Signed   By: Darliss Cheney M.D.   On: 11/11/2023 17:53   CT HEAD WO CONTRAST ( ) Result Date: 11/11/2023 CLINICAL DATA:  Right facial weakness. EXAM: CT HEAD WITHOUT CONTRAST TECHNIQUE: Contiguous axial images were obtained from  the base of the skull through the vertex without intravenous contrast. RADIATION DOSE REDUCTION: This exam was performed according to the departmental dose-optimization program which includes automated exposure control, adjustment of the mA and/or kV according to patient size and/or use of iterative reconstruction technique. COMPARISON:  Head CT 07/21/2022 FINDINGS: Brain: There are new small hypodensities in the left frontoparietal white matter at the level of the posterior centrum semiovale (series 3, image 20), and there is a new subcentimeter focus of cortical hypodensity at the level of the left parietal operculum (series 6, image 46), suspicious for recent infarcts. No intracranial hemorrhage, mass, midline shift, or extra-axial fluid collection is identified. Cerebral volume is normal. The ventricles are normal in size. Vascular: No hyperdense vessel. Skull: No acute fracture or suspicious lesion. Sinuses/Orbits: Visualized paranasal sinuses and mastoid air cells are clear. Bilateral cataract extraction. Other: None. 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 A brain MRI is in progress and will provide further evaluation. Electronically Signed   By: Sebastian Ache M.D.   On: 11/11/2023 15:13   DG Chest 2 View Result Date: 11/10/2023 CLINICAL DATA:  dyspnea EXAM: CHEST - 2 VIEW COMPARISON:  Chest x-ray 07/21/2022, chest x-ray 02/15/2021 FINDINGS: The heart and mediastinal contours are unchanged. Prominent hilar vasculature. No focal consolidation. No pulmonary edema. No pleural effusion. No pneumothorax. No acute osseous abnormality. IMPRESSION: Prominent hilar vasculature suggestive of pulmonary venous congestion. Underlying lymph nodes not excluded. Electronically Signed   By: Tish Frederickson M.D.   On: 11/10/2023 23:50   CT ABDOMEN PELVIS W CONTRAST Result Date: 11/10/2023 CLINICAL DATA:  Acute nonlocalized abdominal pain. Vomiting and diarrhea. EXAM: CT  ABDOMEN AND PELVIS WITH CONTRAST TECHNIQUE: Multidetector CT imaging of the abdomen and pelvis was performed using the standard protocol following bolus administration of intravenous contrast. RADIATION DOSE REDUCTION: This exam was performed according to the departmental dose-optimization program which includes automated exposure control, adjustment of the mA and/or kV according to patient size and/or use of iterative reconstruction technique. CONTRAST:  OMNIPAQUE IOHEXOL 300 MG/ML  SOLN COMPARISON:  None Available. FINDINGS: Lower chest: Focal consolidation suggested in the right lung base with small right pleural effusion. This may represent pneumonia or less likely aspiration. Emphysematous changes in the lungs. Hepatobiliary: Multiple scattered low-attenuation lesions in the liver, most are subcentimeter in size. Largest is in the medial segment left lobe measuring 1.3 cm in diameter. Enhancement pattern is indeterminate. Consider follow-up MRI for further characterization. The gallbladder is not visualized, likely surgically absent. Mild intrahepatic bile duct dilatation is most likely due to postoperative change. No common duct stones are identified. Pancreas: Unremarkable. No pancreatic ductal dilatation or surrounding inflammatory changes. Spleen: Segmental low-attenuation in the upper spleen likely represents a splenic  infarct. Spleen size is normal. Adrenals/Urinary Tract: 1.3 cm diameter left adrenal gland nodule with density measurement of 69 Hounsfield units. Renal nephrograms are symmetrical. No solid mass identified. No hydronephrosis or hydroureter. Bladder is normal. Stomach/Bowel: Stomach is within normal limits. Appendix appears normal. No evidence of bowel wall thickening, distention, or inflammatory changes. Vascular/Lymphatic: Aortic atherosclerosis. No enlarged abdominal or pelvic lymph nodes. Reproductive: Uterus and bilateral adnexa are unremarkable. Other: No abdominal wall hernia or  abnormality. No abdominopelvic ascites. Musculoskeletal: Postoperative fixation of the right hip. No acute bony abnormalities. IMPRESSION: 1. Nonspecific low-attenuation lesions in the liver, largest measuring 1.3 cm diameter. Enhancement pattern is indeterminate. Consider follow-up MRI for further characterization. 2. Mild intrahepatic bile duct dilatation is likely postoperative in the setting of cholecystectomy. 3. Left adrenal mass measuring 1.3 cm, probable benign adenoma. Recommend follow-up adrenal washout CT in 1 year. If stable for = 1 year, no further follow-up imaging. JACR 2017 Aug; 14(8):1038-44, JCAT 2016 Mar-Apr; 40(2):194-200, Urol J 2006 Spring; 3(2):71-4. 4. Aortic atherosclerosis. 5. Focal area of consolidation suggested in the right lung base with small right pleural effusion, likely pneumonia. Electronically Signed   By: Burman Nieves M.D.   On: 11/10/2023 23:30        Scheduled Meds:  acidophilus  1 capsule Oral Daily   apixaban  5 mg Oral BID   Chlorhexidine Gluconate Cloth  6 each Topical Daily   citalopram  10 mg Oral Daily   cyanocobalamin  1,000 mcg Oral Daily   diltiazem  120 mg Oral Daily   feeding supplement  237 mL Oral TID BM   fluticasone furoate-vilanterol  1 puff Inhalation Daily   And   umeclidinium bromide  1 puff Inhalation Daily   guaiFENesin  600 mg Oral BID   pantoprazole (PROTONIX) IV  40 mg Intravenous Q12H   pregabalin  150 mg Oral BID   roflumilast  500 mcg Oral Daily   sodium chloride flush  10-40 mL Intracatheter Q12H   Continuous Infusions:  sodium chloride 125 mL/hr at 11/12/23 1046     LOS: 2 days     Silvano Bilis, MD [facility]   If 7PM-7AM, please contact night-coverage [email or web address] Password [facility] 11/12/2023, 11:04 AM

## 2023-11-12 NOTE — Progress Notes (Signed)
 Debra Minium, MD [facility]   [address], [address] [place], [place] [address] Phone: [phone] Fax : [phone]   Subjective: The patient was found to have a stroke and a CT scanning metastatic disease.  The patient has had no further nausea vomiting or diarrhea.   Objective: Vital signs in last 24 hours: Vitals:   11/12/23 0700 11/12/23 0738 11/12/23 0800 11/12/23 0812  BP: 130/61  138/64 138/64  Pulse: 75 70 72 71  Resp: 15 14 14 15   Temp:  98 F (36.7 C)    TempSrc:  Oral    SpO2: 100% 100% 100% 100%  Weight:      Height:       Weight change:   Intake/Output Summary (Last 24 hours) at 11/12/2023 1051 Last data filed at 11/12/2023 1308 Gross per 24 hour  Intake 1342.51 ml  Output 500 ml  Net 842.51 ml     Exam: Heart:: Regular rate and rhythm or without murmur or extra heart sounds Lungs: normal and clear to auscultation and percussion Abdomen: soft, nontender, normal bowel sounds   Lab Results: @LABTEST2 @ Micro Results: Recent Results (from the past 240 hours)  Resp panel by RT-PCR (RSV, Flu A&B, Covid) Anterior Nasal Swab     Status: None   Collection Time: 11/10/23 11:05 PM   Specimen: Anterior Nasal Swab  Result Value Ref Range Status   SARS Coronavirus 2 by RT PCR NEGATIVE NEGATIVE Final    Comment: (NOTE) SARS-CoV-2 target nucleic acids are NOT DETECTED.  The SARS-CoV-2 RNA is generally detectable in upper respiratory specimens during the acute phase of infection. The lowest concentration of SARS-CoV-2 viral copies this assay can detect is 138 copies/mL. A negative result does not preclude SARS-Cov-2 infection and should not be used as the sole basis for treatment or other patient management decisions. A negative result may occur with  improper specimen collection/handling, submission of specimen other than nasopharyngeal swab, presence of viral mutation(s) within the areas targeted by this assay, and inadequate number of viral copies(<138  copies/mL). A negative result must be combined with clinical observations, patient history, and epidemiological information. The expected result is Negative.  Fact Sheet for Patients:  BloggerCourse.com  Fact Sheet for Healthcare Providers:  SeriousBroker.it  This test is no t yet approved or cleared by the [place] FDA and  has been authorized for detection and/or diagnosis of SARS-CoV-2 by FDA under an Emergency Use Authorization (EUA). This EUA will remain  in effect (meaning this test can be used) for the duration of the COVID-19 declaration under Section 564(b)(1) of the Act, 21 U.S.C.section 360bbb-3(b)(1), unless the authorization is terminated  or revoked sooner.       Influenza A by PCR NEGATIVE NEGATIVE Final   Influenza B by PCR NEGATIVE NEGATIVE Final    Comment: (NOTE) The Xpert Xpress SARS-CoV-2/FLU/RSV plus assay is intended as an aid in the diagnosis of influenza from Nasopharyngeal swab specimens and should not be used as a sole basis for treatment. Nasal washings and aspirates are unacceptable for Xpert Xpress SARS-CoV-2/FLU/RSV testing.  Fact Sheet for Patients: BloggerCourse.com  Fact Sheet for Healthcare Providers: SeriousBroker.it  This test is not yet approved or cleared by the [place] FDA and has been authorized for detection and/or diagnosis of SARS-CoV-2 by FDA under an Emergency Use Authorization (EUA). This EUA will remain in effect (meaning this test can be used) for the duration of the COVID-19 declaration under Section 564(b)(1) of the Act, 21 U.S.C. section 360bbb-3(b)(1), unless  the authorization is terminated or revoked.     Resp Syncytial Virus by PCR NEGATIVE NEGATIVE Final    Comment: (NOTE) Fact Sheet for Patients: BloggerCourse.com  Fact Sheet for Healthcare  Providers: SeriousBroker.it  This test is not yet approved or cleared by the [place] FDA and has been authorized for detection and/or diagnosis of SARS-CoV-2 by FDA under an Emergency Use Authorization (EUA). This EUA will remain in effect (meaning this test can be used) for the duration of the COVID-19 declaration under Section 564(b)(1) of the Act, 21 U.S.C. section 360bbb-3(b)(1), unless the authorization is terminated or revoked.  Performed at [facility], [address] [place], [place] [address]   Blood culture (routine x 2)     Status: None (Preliminary result)   Collection Time: 11/11/23 12:10 AM   Specimen: BLOOD  Result Value Ref Range Status   Specimen Description BLOOD LEFT ANTECUBITAL  Final   Special Requests   Final    BOTTLES DRAWN AEROBIC AND ANAEROBIC Blood Culture adequate volume   Culture   Final    NO GROWTH 1 DAY Performed at [facility], [address], [place], [place] [address]    Report Status PENDING  Incomplete  Blood culture (routine x 2)     Status: None (Preliminary result)   Collection Time: 11/11/23 12:39 AM   Specimen: BLOOD  Result Value Ref Range Status   Specimen Description BLOOD BLOOD RIGHT HAND  Final   Special Requests   Final    BOTTLES DRAWN AEROBIC AND ANAEROBIC Blood Culture adequate volume   Culture   Final    NO GROWTH 1 DAY Performed at [facility], [address] [place], [place] [address]    Report Status PENDING  Incomplete  MRSA Next Gen by PCR, Nasal     Status: None   Collection Time: 11/12/23  1:40 AM   Specimen: Nasal Mucosa; Nasal Swab  Result Value Ref Range Status   MRSA by PCR Next Gen NOT DETECTED NOT DETECTED Final    Comment: (NOTE) The GeneXpert MRSA Assay (FDA approved for NASAL specimens only), is one component of a comprehensive MRSA colonization surveillance program. It is not intended to diagnose MRSA infection nor to guide or monitor  treatment for MRSA infections. Test performance is not FDA approved in patients less than 10 years old. Performed at [facility], [address], [place], [place] [address]    Studies/Results: MR ABDOMEN MRCP W WO CONTAST Result Date: 11/12/2023 CLINICAL DATA:  Evaluate liver lesions.  Chest mass. EXAM: MRI ABDOMEN WITHOUT AND WITH CONTRAST (INCLUDING MRCP) TECHNIQUE: Multiplanar multisequence MR imaging of the abdomen was performed both before and after the administration of intravenous contrast. Heavily T2-weighted images of the biliary and pancreatic ducts were obtained, and three-dimensional MRCP images were rendered by post processing. CONTRAST:  8mL GADAVIST GADOBUTROL 1 MMOL/ML IV SOLN COMPARISON:  CT AP 11/10/2023 FINDINGS: Lower chest: Small right pleural effusion. Partially visualized mass within the superior segment of right lower lobe noted. Hepatobiliary: There are multifocal bilobar, ill-defined areas of mild increased T2 signal noted within both lobes of liver which show hypoenhancement on the postcontrast images and restricted diffusion. At least 5 lesions are noted involving both lobes. The largest lesion is in segment 5 measuring 1.1 cm, image 17/8. Lesion in segment 2/3 measures 6 mm, image 10/8. Status post cholecystectomy. Mild intrahepatic bile duct dilatation. Fusiform dilatation of the common bile duct which measures 1.3 cm proximally. No signs  of choledocholithiasis or mass. Pancreas:  No main duct dilatation, inflammation or mass identified. Spleen: Within the proximal portion of the spleen there are 2 wedge-shaped areas of peripheral hypoenhancement, image 25/31. Adrenals/Urinary Tract: There is a 1.3 cm nodule in the left adrenal gland which is indeterminate with only mild loss of signal on the out of phase sequences, image 29/9. This is a new finding when compared with the remote CT of the abdomen pelvis from 09/17/2020. Normal right adrenal gland. No kidney mass or  signs of obstructive uropathy. Stomach/Bowel: Visualized portions within the abdomen are unremarkable. Vascular/Lymphatic: No aneurysm. Aortic atherosclerosis. No adenopathy. Other:  No ascites. Musculoskeletal: Multifocal abnormal areas of enhancement are identified throughout the visualized portions of the thoracic and lumbar spine which are concerning for osseous metastasis. IMPRESSION: 1. There are multifocal bilobar, ill-defined areas of mild increased T2 signal noted within both lobes of liver which show hypoenhancement on the postcontrast images and restricted diffusion. At least 5 lesions are noted involving both lobes. 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 2. Multifocal abnormal areas of enhancement are identified throughout the visualized portions of the thoracic and lumbar spine which are concerning for osseous metastasis. 3. There are 2 wedge-shaped areas of peripheral hypoenhancement within the proximal portion of the spleen which are concerning for splenic infarcts. 4. There is a 1.3 cm nodule in the left adrenal gland which is indeterminate with only mild loss of signal on the out of phase sequences. This is a new finding when compared with the remote CT of the abdomen pelvis from 09/17/2020. Differential considerations include lipid poor adenoma versus a adrenal metastasis. 5. Small right pleural effusion. 6. Partially visualized mass within the superior segment of right lower lobe. 7. Status post cholecystectomy. Mild intrahepatic bile duct dilatation. Fusiform dilatation of the common bile duct which measures 1.3 cm proximally. No signs of choledocholithiasis or mass. Electronically Signed   By: Signa Kell M.D.   On: 11/12/2023 05:07   MR 3D Recon At Scanner Result Date: 11/12/2023 CLINICAL DATA:  Evaluate liver lesions.  Chest mass. EXAM: MRI ABDOMEN WITHOUT AND WITH CONTRAST (INCLUDING MRCP) TECHNIQUE: Multiplanar multisequence MR imaging of the abdomen was performed both before  and after the administration of intravenous contrast. Heavily T2-weighted images of the biliary and pancreatic ducts were obtained, and three-dimensional MRCP images were rendered by post processing. CONTRAST:  8mL GADAVIST GADOBUTROL 1 MMOL/ML IV SOLN COMPARISON:  CT AP 11/10/2023 FINDINGS: Lower chest: Small right pleural effusion. Partially visualized mass within the superior segment of right lower lobe noted. Hepatobiliary: There are multifocal bilobar, ill-defined areas of mild increased T2 signal noted within both lobes of liver which show hypoenhancement on the postcontrast images and restricted diffusion. At least 5 lesions are noted involving both lobes. The largest lesion is in segment 5 measuring 1.1 cm, image 17/8. Lesion in segment 2/3 measures 6 mm, image 10/8. Status post cholecystectomy. Mild intrahepatic bile duct dilatation. Fusiform dilatation of the common bile duct which measures 1.3 cm proximally. No signs of choledocholithiasis or mass. Pancreas:  No main duct dilatation, inflammation or mass identified. Spleen: Within the proximal portion of the spleen there are 2 wedge-shaped areas of peripheral hypoenhancement, image 25/31. Adrenals/Urinary Tract: There is a 1.3 cm nodule in the left adrenal gland which is indeterminate with only mild loss of signal on the out of phase sequences, image 29/9. This is a new finding when compared with the remote CT of the abdomen pelvis from 09/17/2020. Normal right adrenal  gland. No kidney mass or signs of obstructive uropathy. Stomach/Bowel: Visualized portions within the abdomen are unremarkable. Vascular/Lymphatic: No aneurysm. Aortic atherosclerosis. No adenopathy. Other:  No ascites. Musculoskeletal: Multifocal abnormal areas of enhancement are identified throughout the visualized portions of the thoracic and lumbar spine which are concerning for osseous metastasis. IMPRESSION: 1. There are multifocal bilobar, ill-defined areas of mild increased T2  signal noted within both lobes of liver which show hypoenhancement on the postcontrast images and restricted diffusion. At least 5 lesions are noted involving both lobes. 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 2. Multifocal abnormal areas of enhancement are identified throughout the visualized portions of the thoracic and lumbar spine which are concerning for osseous metastasis. 3. There are 2 wedge-shaped areas of peripheral hypoenhancement within the proximal portion of the spleen which are concerning for splenic infarcts. 4. There is a 1.3 cm nodule in the left adrenal gland which is indeterminate with only mild loss of signal on the out of phase sequences. This is a new finding when compared with the remote CT of the abdomen pelvis from 09/17/2020. Differential considerations include lipid poor adenoma versus a adrenal metastasis. 5. Small right pleural effusion. 6. Partially visualized mass within the superior segment of right lower lobe. 7. Status post cholecystectomy. Mild intrahepatic bile duct dilatation. Fusiform dilatation of the common bile duct which measures 1.3 cm proximally. No signs of choledocholithiasis or mass. Electronically Signed   By: Signa Kell M.D.   On: 11/12/2023 05:07   CT ANGIO HEAD NECK W WO CM Addendum Date: 11/11/2023 ADDENDUM REPORT: 11/11/2023 23:37 ADDENDUM: Findings discussed with Mariah Milling, NP via telephone at 10:33 p.m. Electronically Signed   By: Feliberto Harts M.D.   On: 11/11/2023 23:37   Result Date: 11/11/2023 CLINICAL DATA:  Stroke/TIA, determine embolic source EXAM: CT ANGIOGRAPHY HEAD AND NECK WITH AND WITHOUT CONTRAST TECHNIQUE: Multidetector CT imaging of the head and neck was performed using the standard protocol during bolus administration of intravenous contrast. Multiplanar CT image reconstructions and MIPs were obtained to evaluate the vascular anatomy. Carotid stenosis measurements (when applicable) are obtained utilizing NASCET  criteria, using the distal internal carotid diameter as the denominator. RADIATION DOSE REDUCTION: This exam was performed according to the departmental dose-optimization program which includes automated exposure control, adjustment of the mA and/or kV according to patient size and/or use of iterative reconstruction technique. CONTRAST:  75mL OMNIPAQUE IOHEXOL 350 MG/ML SOLN COMPARISON:  Same day MRI head. FINDINGS: CTA NECK FINDINGS Aortic arch: Aortic atherosclerosis. 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 Great vessel origins are patent. Right carotid system: Atherosclerosis at the carotid bifurcation without greater than 50% stenosis. Left carotid system: Atherosclerosis at the carotid bifurcation without greater than 50% stenosis. Vertebral arteries: Codominant. No evidence of dissection, stenosis (50% or greater), or occlusion. Skeleton: No evidence of acute abnormality on limited assessment. Multilevel degenerative change. Other neck: Approximately 2.3 cm left thyroid nodule. Upper chest: Please see same day CT chest for intrathoracic findings. Emphysema. Review of the MIP images confirms the above findings CTA HEAD FINDINGS Anterior circulation: Bilateral intracranial ICAs are patent. Approximately 2 mm medially directed outpouching arising from the right cavernous ICA, which could represent an infundibulum or aneurysm. Bilateral M1 MCAs are patent. 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 Bilateral ACAs are patent without proximal hemodynamically significant stenosis. Posterior circulation: Bilateral intradural vertebral arteries, basilar artery and bilateral posterior cerebral arteries are patent without proximal hemodynamically significant stenosis. Venous sinuses: As permitted  by contrast timing, patent. Review of the MIP images confirms the above findings The ordering provider has been paged at the time of dictation for call of report.  IMPRESSION: 1. 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 2. Approximately 2 mm medially directed outpouching arising from the right cavernous ICA, which could represent an infundibulum or aneurysm. 3. Please see same day CT chest for intrathoracic findings. 4. Approximately 2.3 cm left thyroid nodule. Recommend thyroid US (ref: J Am Coll Radiol. 2015 Feb;12(2): 143-50). 5. Aortic Atherosclerosis (ICD10-I70.0) and Emphysema (ICD10-J43.9). Electronically Signed: By: Feliberto Harts M.D. On: 11/11/2023 23:14   MR BRAIN W WO CONTRAST Result Date: 11/11/2023 CLINICAL DATA:  right facial weakness, malignancy also on the ddx EXAM: MRI HEAD WITHOUT AND WITH CONTRAST TECHNIQUE: Multiplanar, multiecho pulse sequences of the brain and surrounding structures were obtained without and with intravenous contrast. CONTRAST:  8mL GADAVIST GADOBUTROL 1 MMOL/ML IV SOLN COMPARISON:  CT head November 11, 2023. 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 Mild edema without mass effect. No midline shift. Probable additional punctate acute infarct in the inferior left cerebellum. No evidence of acute hemorrhage, mass lesion, or hydrocephalus. Vascular: Major arterial flow voids are maintained at the skull base. Skull and upper cervical spine: Normal marrow signal. Sinuses/Orbits: Clear sinuses.  No acute orbital findings. IMPRESSION: 1. 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 Probable additional punctate acute infarct in the inferior left cerebellum. These results will be called to the ordering clinician or representative by the Radiologist Assistant, and communication documented in the PACS or [employer]. Electronically Signed   By: Feliberto Harts M.D.   On: 11/11/2023 20:27   CT CHEST W CONTRAST Result Date: 11/11/2023 CLINICAL DATA:  Liver  lesions with history of breast cancer. EXAM: CT CHEST WITH CONTRAST TECHNIQUE: Multidetector CT imaging of the chest was performed during intravenous contrast administration. RADIATION DOSE REDUCTION: This exam was performed according to the departmental dose-optimization program which includes automated exposure control, adjustment of the mA and/or kV according to patient size and/or use of iterative reconstruction technique. CONTRAST:  75mL OMNIPAQUE IOHEXOL 300 MG/ML  SOLN COMPARISON:  CT abdomen and pelvis 11/10/2023. FINDINGS: Cardiovascular: Heart is borderline enlarged/mildly enlarged. Aorta is normal in size. There is no pericardial effusion. There are atherosclerotic calcifications of the aorta. Mediastinum/Nodes: There is a heterogeneous left thyroid nodule attaining cystic and solid components measuring 2.6 x 1.5 cm. There are multiple enlarged right paratracheal lymph nodes measuring up to 2.0 x 2.9 cm. Enlarged subcarinal lymph node measures 1.2 cm short axis. There is a prominent right lower esophageal lymph node measuring 8 mm short axis. There are multiple enlarged right hilar lymph nodes measuring up to 1.4 x 1.8 cm. Visualized esophagus is within normal limits. Lungs/Pleura: Mild emphysematous changes are present. There is scarring in both lung apices. Multifocal airspace disease is seen throughout the right lower lobe, predominantly posteriorly and peripherally. Some areas are slightly nodular for example superior segment measuring 3.2 by 2.0 cm. A small air-fluid level seen in this region. No other air-fluid levels are seen. Additional nodular density seen in the right lower lobe image 5/92 measuring 9 mm. Smaller nodules are seen scattered throughout the right lower lobe measuring 5 mm or less. There is a trace right pleural effusion. There is a right middle lobe nodule measuring 5 mm image 5/122.  There are prominent intrapulmonary lymph nodes along the right major fissure. There are 2 mm  nodular densities in the left upper lobe. Left lung is otherwise clear. Upper Abdomen: Subcentimeter hypodensities in the liver and left adrenal mass are unchanged. Please see CT abdomen and pelvis performed same day for further description. Musculoskeletal: There is anterior intramuscular chest wall edema on the right. Focal density is seen in the medial right breast extending to the skin surface measuring 1.2 x 2.3 cm image 3/81. There some associated diffuse right breast skin thickening. No acute fracture. There is a single 5 mm sclerotic density in the T3 vertebral body. IMPRESSION: 1. Multifocal airspace disease throughout the right lower lobe, predominantly posteriorly and peripherally. Single area is slightly nodular in the superior segment containing an air-fluid level worrisome for necrotic pneumonia or necrotic nodule. Findings may be infectious/inflammatory, but neoplasm not excluded. 2. Trace right pleural effusion. 3. Mediastinal and right hilar lymphadenopathy. 4. Additional right lower lobe and right middle lobe pulmonary nodules measuring 5 mm. 5. Left thyroid nodule measuring 2.6 cm. Recommend further evaluation with ultrasound. 6. Focal density in the medial right breast extending to the skin surface measuring 1.2 x 2.3 cm. Correlate with physical exam. 7. Single 5 mm sclerotic density in the T3 vertebral body. Aortic Atherosclerosis (ICD10-I70.0) and Emphysema (ICD10-J43.9). Electronically Signed   By: Darliss Cheney M.D.   On: 11/11/2023 17:53   CT HEAD WO CONTRAST ( ) Result Date: 11/11/2023 CLINICAL DATA:  Right facial weakness. EXAM: CT HEAD WITHOUT CONTRAST TECHNIQUE: Contiguous axial images were obtained from the base of the skull through the vertex without intravenous contrast. RADIATION DOSE REDUCTION: This exam was performed according to the departmental dose-optimization program which includes automated exposure control, adjustment of the mA and/or kV according to patient size and/or  use of iterative reconstruction technique. COMPARISON:  Head CT 07/21/2022 FINDINGS: Brain: There are new small hypodensities in the left frontoparietal white matter at the level of the posterior centrum semiovale (series 3, image 20), and there is a new subcentimeter focus of cortical hypodensity at the level of the left parietal operculum (series 6, image 46), suspicious for recent infarcts. No intracranial hemorrhage, mass, midline shift, or extra-axial fluid collection is identified. Cerebral volume is normal. The ventricles are normal in size. Vascular: No hyperdense vessel. Skull: No acute fracture or suspicious lesion. Sinuses/Orbits: Visualized paranasal sinuses and mastoid air cells are clear. Bilateral cataract extraction. Other: None. 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 A brain MRI is in progress and will provide further evaluation. Electronically Signed   By: Sebastian Ache M.D.   On: 11/11/2023 15:13   DG Chest 2 View Result Date: 11/10/2023 CLINICAL DATA:  dyspnea EXAM: CHEST - 2 VIEW COMPARISON:  Chest x-ray 07/21/2022, chest x-ray 02/15/2021 FINDINGS: The heart and mediastinal contours are unchanged. Prominent hilar vasculature. No focal consolidation. No pulmonary edema. No pleural effusion. No pneumothorax. No acute osseous abnormality. IMPRESSION: Prominent hilar vasculature suggestive of pulmonary venous congestion. Underlying lymph nodes not excluded. Electronically Signed   By: Tish Frederickson M.D.   On: 11/10/2023 23:50   CT ABDOMEN PELVIS W CONTRAST Result Date: 11/10/2023 CLINICAL DATA:  Acute nonlocalized abdominal pain. Vomiting and diarrhea. EXAM: CT ABDOMEN AND PELVIS WITH CONTRAST TECHNIQUE: Multidetector CT imaging of the abdomen and pelvis was performed using the standard protocol following bolus administration of intravenous contrast. RADIATION DOSE REDUCTION: This exam was performed according to the departmental  dose-optimization program which includes  automated exposure control, adjustment of the mA and/or kV according to patient size and/or use of iterative reconstruction technique. CONTRAST:  OMNIPAQUE IOHEXOL 300 MG/ML  SOLN COMPARISON:  None Available. FINDINGS: Lower chest: Focal consolidation suggested in the right lung base with small right pleural effusion. This may represent pneumonia or less likely aspiration. Emphysematous changes in the lungs. Hepatobiliary: Multiple scattered low-attenuation lesions in the liver, most are subcentimeter in size. Largest is in the medial segment left lobe measuring 1.3 cm in diameter. Enhancement pattern is indeterminate. Consider follow-up MRI for further characterization. The gallbladder is not visualized, likely surgically absent. Mild intrahepatic bile duct dilatation is most likely due to postoperative change. No common duct stones are identified. Pancreas: Unremarkable. No pancreatic ductal dilatation or surrounding inflammatory changes. Spleen: Segmental low-attenuation in the upper spleen likely represents a splenic infarct. Spleen size is normal. Adrenals/Urinary Tract: 1.3 cm diameter left adrenal gland nodule with density measurement of 69 Hounsfield units. Renal nephrograms are symmetrical. No solid mass identified. No hydronephrosis or hydroureter. Bladder is normal. Stomach/Bowel: Stomach is within normal limits. Appendix appears normal. No evidence of bowel wall thickening, distention, or inflammatory changes. Vascular/Lymphatic: Aortic atherosclerosis. No enlarged abdominal or pelvic lymph nodes. Reproductive: Uterus and bilateral adnexa are unremarkable. Other: No abdominal wall hernia or abnormality. No abdominopelvic ascites. Musculoskeletal: Postoperative fixation of the right hip. No acute bony abnormalities. IMPRESSION: 1. Nonspecific low-attenuation lesions in the liver, largest measuring 1.3 cm diameter. Enhancement pattern is indeterminate.  Consider follow-up MRI for further characterization. 2. Mild intrahepatic bile duct dilatation is likely postoperative in the setting of cholecystectomy. 3. Left adrenal mass measuring 1.3 cm, probable benign adenoma. Recommend follow-up adrenal washout CT in 1 year. If stable for = 1 year, no further follow-up imaging. JACR 2017 Aug; 14(8):1038-44, JCAT 2016 Mar-Apr; 40(2):194-200, Urol J 2006 Spring; 3(2):71-4. 4. Aortic atherosclerosis. 5. Focal area of consolidation suggested in the right lung base with small right pleural effusion, likely pneumonia. Electronically Signed   By: Burman Nieves M.D.   On: 11/10/2023 23:30   Medications: Prior to Admission:  Medications Prior to Admission  Medication Sig Dispense Refill Last Dose/Taking   acetaminophen (TYLENOL) 500 MG tablet Take 1,500 mg by mouth 2 (two) times daily as needed for moderate pain.      acidophilus (RISAQUAD) CAPS capsule Take 1 capsule by mouth daily. 10 capsule 0    albuterol (PROVENTIL) (2.5 MG/3ML) 0.083% nebulizer solution Take 2.5 mg by nebulization every 4 (four) hours as needed for wheezing or shortness of breath.       albuterol (VENTOLIN HFA) 108 (90 Base) MCG/ACT inhaler Inhale 2 puffs into the lungs every 6 (six) hours as needed for shortness of breath or wheezing.      alendronate (FOSAMAX) 70 MG tablet Take 70 mg by mouth every Sunday.      apixaban (ELIQUIS) 5 MG TABS tablet Take 1 tablet (5 mg total) by mouth 2 (two) times daily. 180 tablet 1    citalopram (CELEXA) 10 MG tablet Take 10 mg by mouth daily.      Cyanocobalamin (B-12) 1000 MCG CAPS Take 1,000 mcg by mouth daily.      diltiazem (CARDIZEM CD) 120 MG 24 hr capsule Take 1 capsule (120 mg total) by mouth daily. 90 capsule 3    GAVILAX 17 GM/SCOOP powder SMARTSIG:17 Gram(s) By Mouth Daily PRN      lidocaine-prilocaine (EMLA) cream APPLY 1 APPLICATION TOPICALLY AS NEEDED. 30 g 6    Melatonin 5  MG CAPS Take 15 mg by mouth at bedtime.      naloxone (NARCAN)  nasal spray 4 mg/0.1 mL Place 1 spray into the nose as needed (opioid overdose). (Patient not taking: Reported on 06/24/2023)      ondansetron (ZOFRAN) 8 MG tablet Take 1 tablet (8 mg total) by mouth every 8 (eight) hours as needed for nausea (Nausea or vomiting). Start on the third day after chemotherapy. 90 tablet 0    oxyCODONE (OXY IR/ROXICODONE) 5 MG immediate release tablet Take 5 mg by mouth every 6 (six) hours as needed.      pregabalin (LYRICA) 150 MG capsule Take 1 capsule (150 mg total) by mouth 2 (two) times daily. 90 capsule 0    roflumilast (DALIRESP) 500 MCG TABS tablet Take 1 tablet by mouth daily.      tamoxifen (NOLVADEX) 20 MG tablet Take 1 tablet (20 mg total) by mouth daily. 90 tablet 1    tiotropium (SPIRIVA) 18 MCG inhalation capsule Place 18 mcg into inhaler and inhale daily.      TRELEGY ELLIPTA 100-62.5-25 MCG/INH AEPB Inhale 1 puff into the lungs daily.      Scheduled Meds:  acidophilus  1 capsule Oral Daily   apixaban  5 mg Oral BID   Chlorhexidine Gluconate Cloth  6 each Topical Daily   citalopram  10 mg Oral Daily   cyanocobalamin  1,000 mcg Oral Daily   diltiazem  120 mg Oral Daily   feeding supplement  237 mL Oral TID BM   fluticasone furoate-vilanterol  1 puff Inhalation Daily   And   umeclidinium bromide  1 puff Inhalation Daily   guaiFENesin  600 mg Oral BID   pantoprazole (PROTONIX) IV  40 mg Intravenous Q12H   pregabalin  150 mg Oral BID   roflumilast  500 mcg Oral Daily   sodium chloride flush  10-40 mL Intracatheter Q12H   Continuous Infusions:  sodium chloride 125 mL/hr at 11/12/23 1046   PRN Meds:.acetaminophen **OR** acetaminophen, chlorpheniramine-HYDROcodone, magnesium hydroxide, ondansetron **OR** ondansetron (ZOFRAN) IV, oxyCODONE, traZODone   Assessment: Principal Problem:   Intractable nausea and vomiting Active Problems:   Chronic constipation   Invasive carcinoma of breast (HCC)   HER2-positive carcinoma of breast (HCC)    Port-A-Cath in place   Aspiration pneumonia (HCC)   Paroxysmal atrial fibrillation (HCC)   Chronic obstructive pulmonary disease (COPD) (HCC)   History of breast cancer   Obesity (BMI 30-39.9)    Plan: Patient has had what a stroke and what appears to be metastatic disease.  The patient is now in the ICU.  Nothing further to do from a GI point of view.  I will sign off.  Please call if any further GI concerns or questions.  We would like to thank you for the opportunity to participate in the care of Debra [name].    LOS: 2 days   Debra Minium, MD.FACG 11/12/2023, 10:51 AM Pager [phone] 7am-5pm  Check AMION for 5pm -7am coverage and on weekends
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
## 2023-11-12 NOTE — Plan of Care (Signed)
  Problem: Education: Goal: Knowledge of General Education information will improve Description: Including pain rating scale, medication(s)/side effects and non-pharmacologic comfort measures Outcome: Progressing   Problem: Health Behavior/Discharge Planning: Goal: Ability to manage health-related needs will improve Outcome: Progressing   Problem: Clinical Measurements: Goal: Respiratory complications will improve Outcome: Progressing   Problem: Clinical Measurements: Goal: Cardiovascular complication will be avoided Outcome: Progressing   Problem: Elimination: Goal: Will not experience complications related to bowel motility Outcome: Progressing   Problem: Pain Managment: Goal: General experience of comfort will improve and/or be controlled Outcome: Progressing   Problem: Safety: Goal: Ability to remain free from injury will improve Outcome: Progressing

## 2023-11-12 NOTE — Progress Notes (Addendum)
 PHARMACY - ANTICOAGULATION CONSULT NOTE  Pharmacy Consult for heparin drip Indication: atrial fibrillation   (on apixaban, last dose 3/27 @ 0807)  Allergies  Allergen Reactions   Cephalexin Hives   Strawberry Extract Hives    Patient Measurements: Height: 5\' 4"  (162.6 cm) Weight: 81.6 kg (180 lb) IBW/kg (Calculated) : 54.7 HEPARIN DW (KG): 72.4  Vital Signs: Temp: 97.9 F (36.6 C) (03/27 1531) Temp Source: Axillary (03/27 1200) BP: 108/80 (03/27 1531) Pulse Rate: 77 (03/27 1531)  Labs: Recent Labs    11/10/23 2019 11/10/23 2305 11/11/23 0041 11/11/23 0228 11/12/23 0555  HGB 11.2*  --  10.3*  --  8.7*  HCT 33.0*  --  31.0*  --  25.7*  PLT 257  --  251  --  202  CREATININE 0.79  --  0.77  --  0.93  CKTOTAL  --  17*  --   --   --   TROPONINIHS  --  93*  --  94*  --     Estimated Creatinine Clearance: 59 mL/min (by C-G formula based on SCr of 0.93 mg/dL).   Medical History: Past Medical History:  Diagnosis Date   Anxiety    Aortic atherosclerosis (HCC)    Arthritis    Atrial fibrillation (HCC) 08/01/2020   a.) CHA2DS2-VASc = 4 (age, sex, HTN, aortic plaque). b.) chronically anticoagulated using apixaban   Breast cancer, right breast (HCC) 01/08/2021   a.) Stage IB (cT2cN0cM0) invasive mammary carcinoma of the RIGHT breast; grade I, ER/PR (+) and HER2/neu (+). Tx with neoadjuvant TCHP chemotherapy.   Carotid bruit    L --nl doppler 5/09- and again 5/13 with 0-39% stenosis bilat   Constipation    COPD (chronic obstructive pulmonary disease) (HCC)    Diastolic dysfunction 08/02/2020   a.) TTE 08/02/2020: EF 60-65%; G1DD; triv MR/AR. b.) TTE 02/05/2021: ED 55-60%; G1DD; GLS -19.0%. c.) TTE 05/07/2021: TTE 55-60%; GLS -20.3%.   Family history of brain cancer    Family history of breast cancer    Family history of kidney cancer    Family history of lung cancer    Fatigue    Fracture of femoral neck, right (HCC) 2015   GERD (gastroesophageal reflux disease)     GI (gastrointestinal bleed)    [name]   Hepatitis    Hyperlipidemia    Hypertension    Left arm pain    Leg pain    Chronic pain R leg from injury   Long term current use of anticoagulant    a.) apixaban   OSA and COPD overlap syndrome (HCC)    a.) no nocurnal PAP therapy; does utilize supplemental oxygen.   Osteopenia    Other organic sleep disorders    Supplemental oxygen dependent    Tobacco abuse     Medications:  Medications Prior to Admission  Medication Sig Dispense Refill Last Dose/Taking   acetaminophen (TYLENOL) 500 MG tablet Take 1,500 mg by mouth 2 (two) times daily as needed for moderate pain.      acidophilus (RISAQUAD) CAPS capsule Take 1 capsule by mouth daily. 10 capsule 0    albuterol (PROVENTIL) (2.5 MG/3ML) 0.083% nebulizer solution Take 2.5 mg by nebulization every 4 (four) hours as needed for wheezing or shortness of breath.       albuterol (VENTOLIN HFA) 108 (90 Base) MCG/ACT inhaler Inhale 2 puffs into the lungs every 6 (six) hours as needed for shortness of breath or wheezing.      alendronate (FOSAMAX) 70  MG tablet Take 70 mg by mouth every Sunday.      apixaban (ELIQUIS) 5 MG TABS tablet Take 1 tablet (5 mg total) by mouth 2 (two) times daily. 180 tablet 1    citalopram (CELEXA) 10 MG tablet Take 10 mg by mouth daily.      Cyanocobalamin (B-12) 1000 MCG CAPS Take 1,000 mcg by mouth daily.      diltiazem (CARDIZEM CD) 120 MG 24 hr capsule Take 1 capsule (120 mg total) by mouth daily. 90 capsule 3    GAVILAX 17 GM/SCOOP powder SMARTSIG:17 Gram(s) By Mouth Daily PRN      lidocaine-prilocaine (EMLA) cream APPLY 1 APPLICATION TOPICALLY AS NEEDED. 30 g 6    Melatonin 5 MG CAPS Take 15 mg by mouth at bedtime.      naloxone (NARCAN) nasal spray 4 mg/0.1 mL Place 1 spray into the nose as needed (opioid overdose). (Patient not taking: Reported on 06/24/2023)      ondansetron (ZOFRAN) 8 MG tablet Take 1 tablet (8 mg total) by mouth every 8 (eight) hours as needed for  nausea (Nausea or vomiting). Start on the third day after chemotherapy. 90 tablet 0    oxyCODONE (OXY IR/ROXICODONE) 5 MG immediate release tablet Take 5 mg by mouth every 6 (six) hours as needed.      pregabalin (LYRICA) 150 MG capsule Take 1 capsule (150 mg total) by mouth 2 (two) times daily. 90 capsule 0    roflumilast (DALIRESP) 500 MCG TABS tablet Take 1 tablet by mouth daily.      tamoxifen (NOLVADEX) 20 MG tablet Take 1 tablet (20 mg total) by mouth daily. 90 tablet 1    tiotropium (SPIRIVA) 18 MCG inhalation capsule Place 18 mcg into inhaler and inhale daily.      TRELEGY ELLIPTA 100-62.5-25 MCG/INH AEPB Inhale 1 puff into the lungs daily.      Scheduled:   acidophilus  1 capsule Oral Daily   Chlorhexidine Gluconate Cloth  6 each Topical Daily   citalopram  10 mg Oral Daily   cyanocobalamin  1,000 mcg Oral Daily   diltiazem  120 mg Oral Daily   feeding supplement  237 mL Oral TID BM   fluticasone furoate-vilanterol  1 puff Inhalation Daily   And   umeclidinium bromide  1 puff Inhalation Daily   guaiFENesin  600 mg Oral BID   pantoprazole (PROTONIX) IV  40 mg Intravenous Q12H   pregabalin  150 mg Oral BID   roflumilast  500 mcg Oral Daily   sodium chloride flush  10-40 mL Intracatheter Q12H   Infusions:   heparin      Assessment: 68 yo F on apixaban for Afib now being transitioned to heparin drip (anticipate liver lesion biopsy per Oncology note).  Last dose of apixaban 5 mg given 3/27 @ 0807 Baseline:  aPTT 26 and Heparin level >1.10  Hgb 8.7  Plt 202   Goal of Therapy:  Heparin level 0.3-0.7 units/ml aPTT 66-102 seconds Monitor platelets by anticoagulation protocol: Yes   Plan:  No bolus since apixaban 5 mg given 3/27 @ 0807 Start heparin infusion at 1100 units/hr at 2000 Check aPTT level in 6 hours and daily while on heparin Check HL and aPTT daily until correlating Continue to monitor H&H and platelets  Bari Mantis PharmD Clinical  Pharmacist 11/12/2023

## 2023-11-12 NOTE — Progress Notes (Signed)
 PT Cancellation Note  Patient Details Name: Debra [name] MRN: [id number] DOB: 1955-10-14   Cancelled Treatment:    Reason Eval/Treat Not Completed: Other (comment). Per chart review and discussion with OT, neuro note from 3/26 says strict bedrest, no neuro note in chart today. PT will follow up later this date/tomorrow as approrpiate.  Olga Coaster PT, DPT 1:31 PM,11/12/23

## 2023-11-12 NOTE — Progress Notes (Signed)

## 2023-11-13 DIAGNOSIS — R112 Nausea with vomiting, unspecified: Secondary | ICD-10-CM | POA: Diagnosis not present

## 2023-11-13 DIAGNOSIS — K769 Liver disease, unspecified: Secondary | ICD-10-CM | POA: Diagnosis not present

## 2023-11-13 DIAGNOSIS — I480 Paroxysmal atrial fibrillation: Secondary | ICD-10-CM | POA: Diagnosis not present

## 2023-11-13 DIAGNOSIS — I6340 Cerebral infarction due to embolism of unspecified cerebral artery: Secondary | ICD-10-CM | POA: Diagnosis not present

## 2023-11-13 DIAGNOSIS — C50919 Malignant neoplasm of unspecified site of unspecified female breast: Secondary | ICD-10-CM | POA: Diagnosis not present

## 2023-11-13 LAB — CBC
HCT: 26.7 % — ABNORMAL LOW (ref 36.0–46.0)
Hemoglobin: 9.1 g/dL — ABNORMAL LOW (ref 12.0–15.0)
MCH: 30.5 pg (ref 26.0–34.0)
MCHC: 34.1 g/dL (ref 30.0–36.0)
MCV: 89.6 fL (ref 80.0–100.0)
Platelets: 231 10*3/uL (ref 150–400)
RBC: 2.98 MIL/uL — ABNORMAL LOW (ref 3.87–5.11)
RDW: 13.3 % (ref 11.5–15.5)
WBC: 9.3 10*3/uL (ref 4.0–10.5)
nRBC: 0 % (ref 0.0–0.2)

## 2023-11-13 LAB — APTT
aPTT: 56 s — ABNORMAL HIGH (ref 24–36)
aPTT: 90 s — ABNORMAL HIGH (ref 24–36)
aPTT: 87 s — ABNORMAL HIGH (ref 24–36)

## 2023-11-13 LAB — COMPREHENSIVE METABOLIC PANEL WITH GFR
ALT: 113 U/L — ABNORMAL HIGH (ref 0–44)
AST: 68 U/L — ABNORMAL HIGH (ref 15–41)
Albumin: 2.9 g/dL — ABNORMAL LOW (ref 3.5–5.0)
Alkaline Phosphatase: 49 U/L (ref 38–126)
Anion gap: 7 (ref 5–15)
BUN: 9 mg/dL (ref 8–23)
CO2: 27 mmol/L (ref 22–32)
Calcium: 8.7 mg/dL — ABNORMAL LOW (ref 8.9–10.3)
Chloride: 108 mmol/L (ref 98–111)
Creatinine, Ser: 0.81 mg/dL (ref 0.44–1.00)
GFR, Estimated: >60 mL/min (ref >60)
Glucose, Bld: 110 mg/dL — ABNORMAL HIGH (ref 70–99)
Potassium: 3.7 mmol/L (ref 3.5–5.1)
Sodium: 142 mmol/L (ref 135–145)
Total Bilirubin: 0.6 mg/dL (ref 0.0–1.2)
Total Protein: 6.2 g/dL — ABNORMAL LOW (ref 6.5–8.1)

## 2023-11-13 LAB — PROTIME-INR
INR: 1.6 — ABNORMAL HIGH (ref 0.8–1.2)
Prothrombin Time: 19.2 s — ABNORMAL HIGH (ref 11.4–15.2)

## 2023-11-13 LAB — HEPARIN LEVEL (UNFRACTIONATED): Heparin Unfractionated: >1.1 [IU]/mL — ABNORMAL HIGH (ref 0.30–0.70)

## 2023-11-13 MED ORDER — PANTOPRAZOLE SODIUM 40 MG PO TBEC
40.0000 mg | DELAYED_RELEASE_TABLET | Freq: Every day | ORAL | Status: DC
  Administered 2023-11-14 – 2023-11-16 (×3): 40 mg via ORAL
  Filled 2023-11-13 (×3): qty 1

## 2023-11-13 MED ORDER — OXYCODONE HCL 5 MG PO TABS
5.0000 mg | ORAL_TABLET | ORAL | Status: DC | PRN
  Administered 2023-11-13 – 2023-11-16 (×12): 5 mg via ORAL
  Filled 2023-11-13 (×12): qty 1

## 2023-11-13 MED ORDER — SODIUM CHLORIDE 0.9% FLUSH: 3.0000 mL | INTRAVENOUS | Status: DC | PRN

## 2023-11-13 MED ORDER — HEPARIN BOLUS VIA INFUSION
1100.0000 [IU] | Freq: Once | INTRAVENOUS | Status: AC
  Administered 2023-11-13: 1100 [IU] via INTRAVENOUS
  Filled 2023-11-13: qty 1100

## 2023-11-13 MED ORDER — SODIUM CHLORIDE 0.9% FLUSH
3.0000 mL | Freq: Two times a day (BID) | INTRAVENOUS | Status: DC
  Administered 2023-11-13 (×2): 3 mL via INTRAVENOUS
  Administered 2023-11-14 – 2023-11-16 (×5): 10 mL via INTRAVENOUS

## 2023-11-13 MED ORDER — ATORVASTATIN CALCIUM 80 MG PO TABS
80.0000 mg | ORAL_TABLET | Freq: Every day | ORAL | Status: DC
  Administered 2023-11-13 – 2023-11-16 (×4): 80 mg via ORAL
  Filled 2023-11-13 (×4): qty 1

## 2023-11-13 NOTE — Evaluation (Signed)
 Physical Therapy Evaluation Patient Details Name: Debra [name] MRN: [id number] DOB: 13-Dec-1955 Today's Date: 11/13/2023  History of Present Illness  DELYNDA [name] is a 68 y.o. Caucasian female with medical history significant for paroxysmal atrial fibrillation, anxiety, COPD, on home O2 at 3 L/min, diastolic function, GERD, and breast cancer, not currently on chemotherapy, hypertension and dyslipidemia, who presented to the emergency room with acute onset of recurrent nausea and vomiting over the last week and a half. 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 Probable additional punctate acute infarct in the inferior left  cerebellum.  Patient also found to have many areas of metastatic disease.   Clinical Impression  Patient received in bed she is pleasant and motivated to get up. Patient is mod I with bed mobility. Transfers with cga. Patient ambulated 25 feet in room with cga, patient reaching out for furniture to assist with balance. She is moving well and has no s/s in extremities of cva. Patient will continue to benefit from skilled PT while here to improve safety with mobility.             If plan is discharge home, recommend the following: A little help with walking and/or transfers;A little help with bathing/dressing/bathroom   Can travel by private vehicle    yes    Equipment Recommendations None recommended by PT  Recommendations for Other Services       Functional Status Assessment Patient has had a recent decline in their functional status and demonstrates the ability to make significant improvements in function in a reasonable and predictable amount of time.     Precautions / Restrictions Precautions Precautions: Fall Recall of Precautions/Restrictions: Intact Precaution/Restrictions Comments: mod fall Restrictions Weight Bearing Restrictions Per Provider Order: No      Mobility  Bed  Mobility Overal bed mobility: Modified Independent                  Transfers Overall transfer level: Modified independent Equipment used: None                    Ambulation/Gait Ambulation/Gait assistance: Contact guard assist Gait Distance (Feet): 25 Feet Assistive device: None Gait Pattern/deviations: Step-through pattern, Decreased step length - right, Decreased step length - left, Decreased stride length Gait velocity: decr     General Gait Details: patient reaching out for furniture in room to assist with balance, has chronic leg length discrepancy and injury to R LE.  Stairs            Wheelchair Mobility     Tilt Bed    Modified Rankin (Stroke Patients Only)       Balance Overall balance assessment: Needs assistance Sitting-balance support: Feet supported Sitting balance-Leahy Scale: Normal     Standing balance support: Single extremity supported, During functional activity Standing balance-Leahy Scale: Fair                               Pertinent Vitals/Pain Pain Assessment Pain Assessment: No/denies pain    Home Living Family/patient expects to be discharged to:: Private residence Living Arrangements: Spouse/significant other Available Help at Discharge: Family Type of Home: Mobile home Home Access: Stairs to enter Entrance Stairs-Rails: Right;Left;Can reach both Entrance Stairs-Number of Steps: 4   Home Layout: One level Home Equipment: Rollator (4 wheels)      Prior Function  Mobility Comments: 2 liters chronic O2 at home, does not use AD in home, but uses rollator when out in community ADLs Comments: independent     Extremity/Trunk Assessment   Upper Extremity Assessment Upper Extremity Assessment: Overall WFL for tasks assessed    Lower Extremity Assessment Lower Extremity Assessment: Overall WFL for tasks assessed    Cervical / Trunk Assessment Cervical / Trunk Assessment:  Normal  Communication   Communication Communication: No apparent difficulties    Cognition Arousal: Alert Behavior During Therapy: WFL for tasks assessed/performed   PT - Cognitive impairments: No apparent impairments                         Following commands: Intact       Cueing Cueing Techniques: Verbal cues     General Comments      Exercises     Assessment/Plan    PT Assessment Patient needs continued PT services  PT Problem List Decreased strength;Decreased activity tolerance;Decreased balance;Decreased mobility       PT Treatment Interventions DME instruction;Gait training;Stair training;Functional mobility training;Therapeutic activities;Therapeutic exercise;Balance training;Neuromuscular re-education;Patient/family education    PT Goals (Current goals can be found in the Care Plan section)  Acute Rehab PT Goals Patient Stated Goal: return home PT Goal Formulation: With patient/family Time For Goal Achievement: 11/27/23 Potential to Achieve Goals: Good    Frequency Min 2X/week     Co-evaluation               AM-PAC PT "6 Clicks" Mobility  Outcome Measure Help needed turning from your back to your side while in a flat bed without using bedrails?: None Help needed moving from lying on your back to sitting on the side of a flat bed without using bedrails?: None Help needed moving to and from a bed to a chair (including a wheelchair)?: A Little Help needed standing up from a chair using your arms (e.g., wheelchair or bedside chair)?: A Little Help needed to walk in hospital room?: A Little Help needed climbing 3-5 steps with a railing? : A Little 6 Click Score: 20    End of Session Equipment Utilized During Treatment: Gait belt;Oxygen Activity Tolerance: Patient tolerated treatment well Patient left: in chair;with call bell/phone within reach;with chair alarm set;with family/visitor present Nurse Communication: Mobility status PT Visit  Diagnosis: Other abnormalities of gait and mobility (R26.89);Muscle weakness (generalized) (M62.81);Difficulty in walking, not elsewhere classified (R26.2);Unsteadiness on feet (R26.81)    Time: 9604-5409 PT Time Calculation (min) (ACUTE ONLY): 23 min   Charges:   PT Evaluation $PT Eval Moderate Complexity: 1 Mod   PT General Charges $$ ACUTE PT VISIT: 1 Visit         Lucille Witts, PT, GCS 11/13/23,11:09 AM

## 2023-11-13 NOTE — Plan of Care (Signed)
 Neurology plan of care  Please see neurology consult note from yesterday for full findings and initial recommendations.  Interim results:  TTE 1. Left ventricular ejection fraction, by estimation, is 60 to 65% . The left ventricle has normal function. The left ventricle has no regional wall motion abnormalities. There is mild left ventricular hypertrophy. Left ventricular diastolic parameters are consistent with Grade I diastolic dysfunction ( impaired relaxation) . 2. Right ventricular systolic function is normal. The right ventricular size is normal. There is normal pulmonary artery systolic pressure. The estimated right ventricular systolic pressure is 21. 3 mmHg. 3. The mitral valve is normal in structure. Mild to moderate mitral valve regurgitation. No evidence of mitral stenosis. 4. The aortic valve is normal in structure. Aortic valve regurgitation is not visualized. No aortic stenosis is present. 5. The inferior vena cava is normal in size with greater than 50% respiratory variability, suggesting right atrial pressure of 3 mmHg.  A/P: [name] is a 68 y.o. female with a past medical history significant for A-fib on eliquis, anxiety, COPD on home oxygen at 3 L, diastolic dysfunction, breast cancer not currently on chemotherapy, hypertension, dyslipidemia who was found to have multiple small acute infarcts in posterior L MCA distribution as well as punctate infarct in the inferior left cerebellum (involvement of multiple vascular territories is consistent with a central embolic source).  She does have a history of breast cancer and was found at this admission to have widespread metastatic disease.  She is on Eliquis for A-fib but likely had decreased absorption 2/2 10 days of n/v (therefore would not consider this an eliquis failure.  - Stroke workup is now completed - She is currently on heparin gtt in anticipation of liver biopsy Monday. Afterwards she can be transitioned back to eliquis per  Dr. Cathie Hoops of oncology - I will arrange outpatient f/u  Neurology to sign off, but please re-engage if additional neurologic concerns arise.  Bing Neighbors, MD [facility] [phone]  If 7pm- 7am, please page neurology on call as listed in [facility].

## 2023-11-13 NOTE — Evaluation (Signed)
 Occupational Therapy Evaluation Patient Details Name: Debra [name] MRN: [id number] DOB: August 14, 1956 Today's Date: 11/13/2023   History of Present Illness   Debra [name] is a 68 y.o. Caucasian female with medical history significant for paroxysmal atrial fibrillation, anxiety, COPD, on home O2 at 3 L/min, diastolic function, GERD, and breast cancer, not currently on chemotherapy, hypertension and dyslipidemia, who presented to the emergency room with acute onset of recurrent nausea and vomiting over the last week and a half. 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 Probable additional punctate acute infarct in the inferior left  cerebellum.  Patient also found to have areas of metastatic disease.     Clinical Impressions Chart reviewed, pt greeted in room with husband present, alert and oriented x4, mild R facial droop noted. Cognition appears intact, will continue to assess higher level executive functioning. PTA pt generally MOD I-I in ADL, amb with no AD in home, rollator community distances. Pt is generally weak but equally symmetric. Pt requires MOD A for LB dressing, amb in room with CGA-MIN A via HHA, toileting with supervision. Pt is performing ADL below, PLOF,  will benefit from acute OT to address deficits and to facilitate optimal ADL performance. Pt is left in chair, all needs met. OT will follow acutely.       If plan is discharge home, recommend the following:   A little help with walking and/or transfers;A little help with bathing/dressing/bathroom;Help with stairs or ramp for entrance;Assistance with cooking/housework;Assist for transportation     Functional Status Assessment   Patient has had a recent decline in their functional status and demonstrates the ability to make significant improvements in function in a reasonable and predictable amount of time.     Equipment Recommendations    BSC/3in1     Recommendations for Other Services         Precautions/Restrictions   Precautions Precautions: Fall Recall of Precautions/Restrictions: Intact Restrictions Weight Bearing Restrictions Per Provider Order: No     Mobility Bed Mobility Overal bed mobility: Modified Independent                  Transfers Overall transfer level: Needs assistance Equipment used: None Transfers: Sit to/from Stand Sit to Stand: Contact guard assist                  Balance Overall balance assessment: Needs assistance Sitting-balance support: Feet supported Sitting balance-Leahy Scale: Normal     Standing balance support: Single extremity supported Standing balance-Leahy Scale: Fair                             ADL either performed or assessed with clinical judgement   ADL Overall ADL's : Needs assistance/impaired     Grooming: Wash/dry hands;Standing Grooming Details (indicate cue type and reason): CGA at sink with no AD             Lower Body Dressing: Moderate assistance;Sitting/lateral leans Lower Body Dressing Details (indicate cue type and reason): socks/shoes Toilet Transfer: Contact guard assist;Minimal assistance;Ambulation;Regular Toilet;Grab bars Toilet Transfer Details (indicate cue type and reason): Via HHA Toileting- Clothing Manipulation and Hygiene: Supervision/safety;Sitting/lateral lean Toileting - Clothing Manipulation Details (indicate cue type and reason): continent urine on toilet     Functional mobility during ADLs: Contact guard assist;Minimal assistance (approx 15' two attempts in room)       Vision  Patient Visual Report: No change from baseline Vision Assessment?: No apparent visual deficits     Perception         Praxis         Pertinent Vitals/Pain Pain Assessment Pain Assessment: No/denies pain     Extremity/Trunk Assessment Upper Extremity Assessment Upper Extremity Assessment: Generalized  weakness   Lower Extremity Assessment Lower Extremity Assessment: Generalized weakness   Cervical / Trunk Assessment Cervical / Trunk Assessment: Normal   Communication Communication Communication: No apparent difficulties   Cognition Arousal: Alert Behavior During Therapy: WFL for tasks assessed/performed Cognition: No apparent impairments             OT - Cognition Comments: will continue to assess higher level executive functioning                 Following commands: Intact       Cueing  General Comments   Cueing Techniques: Verbal cues  vss throughout   Exercises Other Exercises Other Exercises: edu er: role of OT, role of rehab, discharge recommendations   Shoulder Instructions      Home Living Family/patient expects to be discharged to:: Private residence Living Arrangements: Spouse/significant other Available Help at Discharge: Family Type of Home: Mobile home Home Access: Stairs to enter Secretary/administrator of Steps: 4 Entrance Stairs-Rails: Right;Left;Can reach both Home Layout: One level     Bathroom Shower/Tub: Chief Strategy Officer: Standard     Home Equipment: Rollator (4 wheels)          Prior Functioning/Environment Prior Level of Function : Independent/Modified Independent             Mobility Comments: 2 liters chronic O2 at home, does not use AD in home, but uses rollator when out in community ADLs Comments: MOD I-I in ADL/IADL    OT Problem List: Decreased strength;Decreased activity tolerance;Decreased knowledge of use of DME or AE;Impaired balance (sitting and/or standing)   OT Treatment/Interventions: Self-care/ADL training;Therapeutic exercise;Patient/family education;DME and/or AE instruction;Therapeutic activities;Cognitive remediation/compensation      OT Goals(Current goals can be found in the care plan section)   Acute Rehab OT Goals Patient Stated Goal: get to chair OT Goal Formulation:  With patient Time For Goal Achievement: 11/27/23 Potential to Achieve Goals: Good ADL Goals Pt Will Perform Grooming: with supervision;sitting Pt Will Perform Lower Body Dressing: with supervision;sitting/lateral leans;sit to/from stand Pt Will Transfer to Toilet: with supervision;ambulating Pt Will Perform Toileting - Clothing Manipulation and hygiene: with supervision;sitting/lateral leans;sit to/from stand   OT Frequency:  Min 2X/week    Co-evaluation PT/OT/SLP Co-Evaluation/Treatment: Yes Reason for Co-Treatment: For patient/therapist safety;Complexity of the patient's impairments (multi-system involvement);To address functional/ADL transfers   OT goals addressed during session: ADL's and self-care      AM-PAC OT "6 Clicks" Daily Activity     Outcome Measure Help from another person eating meals?: None Help from another person taking care of personal grooming?: None Help from another person toileting, which includes using toliet, bedpan, or urinal?: None Help from another person bathing (including washing, rinsing, drying)?: A Little Help from another person to put on and taking off regular upper body clothing?: None Help from another person to put on and taking off regular lower body clothing?: A Little 6 Click Score: 22   End of Session Equipment Utilized During Treatment: Gait belt  Activity Tolerance: Patient tolerated treatment well Patient left: in chair;with call bell/phone within reach;with chair alarm set  OT Visit Diagnosis: Other abnormalities of gait  and mobility (R26.89)                Time: 4098-1191 OT Time Calculation (min): 16 min Charges:  OT General Charges $OT Visit: 1 Visit OT Evaluation $OT Eval Low Complexity: 1 Low  Oleta Mouse, OTD OTR/L  11/13/23, 1:34 PM

## 2023-11-13 NOTE — Progress Notes (Signed)
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 portion of the spleen which are concerning for splenic infarcts. 4. There is a 1.3 cm nodule in the left adrenal gland which is indeterminate with only mild loss of signal on the out of phase sequences. This is a new finding when compared with the remote CT of the abdomen pelvis from 09/17/2020. Differential considerations include lipid poor adenoma versus a adrenal metastasis. 5. Small right pleural effusion. 6. Partially visualized mass within the superior segment of right lower lobe. 7. Status post cholecystectomy. Mild intrahepatic bile duct dilatation. Fusiform dilatation of the common bile duct which measures 1.3 cm proximally. No signs of choledocholithiasis or mass. Electronically Signed   By: Signa Kell M.D.   On: 11/12/2023 05:07   CT ANGIO HEAD NECK W WO CM Addendum Date: 11/11/2023 ADDENDUM REPORT: 11/11/2023 23:37 ADDENDUM: Findings discussed with Mariah Milling, NP via telephone at 10:33 p.m. Electronically Signed   By: Feliberto Harts M.D.   On:  11/11/2023 23:37   Result Date: 11/11/2023 CLINICAL DATA:  Stroke/TIA, determine embolic source EXAM: CT ANGIOGRAPHY HEAD AND NECK WITH AND WITHOUT CONTRAST TECHNIQUE: Multidetector CT imaging of the head and neck was performed using the standard protocol during bolus administration of intravenous contrast. Multiplanar CT image reconstructions and MIPs  were obtained to evaluate the vascular anatomy. Carotid stenosis measurements (when applicable) are obtained utilizing NASCET criteria, using the distal internal carotid diameter as the denominator. RADIATION DOSE REDUCTION: This exam was performed according to the departmental dose-optimization program which includes automated exposure control, adjustment of the mA and/or kV according to patient size and/or use of iterative reconstruction technique. CONTRAST:  75mL OMNIPAQUE IOHEXOL 350 MG/ML SOLN COMPARISON:  Same day MRI head. FINDINGS: CTA NECK FINDINGS Aortic arch: Aortic atherosclerosis. 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 Great vessel origins are patent. Right carotid system: Atherosclerosis at the carotid bifurcation without greater than 50% stenosis. Left carotid system: Atherosclerosis at the carotid bifurcation without greater than 50% stenosis. Vertebral arteries: Codominant. No evidence of dissection, stenosis (50% or greater), or occlusion. Skeleton: No evidence of acute abnormality on limited assessment. Multilevel degenerative change. Other neck: Approximately 2.3 cm left thyroid nodule. Upper chest: Please see same day CT chest for intrathoracic findings. Emphysema. Review of the MIP images confirms the above findings CTA HEAD FINDINGS Anterior circulation: Bilateral intracranial ICAs are patent. Approximately 2 mm medially directed outpouching arising from the right cavernous ICA, which could represent an infundibulum or aneurysm. Bilateral M1 MCAs are patent. 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 Bilateral ACAs are patent without proximal hemodynamically significant stenosis. Posterior circulation: Bilateral intradural vertebral arteries, basilar artery and bilateral posterior cerebral arteries are patent without proximal hemodynamically significant stenosis. Venous sinuses: As permitted by contrast timing, patent. Review of the MIP images confirms the above findings The ordering provider has been paged at the time of dictation for call of report. IMPRESSION: 1. 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 2. Approximately 2 mm medially directed outpouching arising from the right cavernous ICA, which could represent an infundibulum or aneurysm. 3. Please see same day CT chest for intrathoracic findings. 4. Approximately 2.3 cm left thyroid nodule. Recommend thyroid US (ref: J Am Coll Radiol. 2015 Feb;12(2): 143-50). 5. Aortic Atherosclerosis (ICD10-I70.0) and Emphysema (ICD10-J43.9). Electronically Signed: By: Feliberto Harts M.D. On: 11/11/2023 23:14   MR BRAIN W WO CONTRAST Result Date: 11/11/2023 CLINICAL DATA:  right facial weakness, malignancy also on the ddx EXAM: MRI HEAD WITHOUT AND WITH CONTRAST TECHNIQUE: Multiplanar, multiecho pulse sequences of the brain and surrounding structures were obtained without and with intravenous contrast. CONTRAST:  8mL GADAVIST GADOBUTROL 1 MMOL/ML IV SOLN COMPARISON:  CT head November 11, 2023. 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 Mild edema without mass effect. No midline shift. Probable additional punctate acute infarct in the inferior left cerebellum. No evidence of acute hemorrhage, mass lesion, or hydrocephalus. Vascular: Major arterial flow voids are maintained at the skull base. Skull and upper cervical spine: Normal marrow signal. Sinuses/Orbits: Clear sinuses.  No acute orbital findings. IMPRESSION: 1. Many small acute infarcts in  the posterior left MCA distribution including the left frontal and parietal lobes 2. Probable additional punctate acute infarct in the inferior left cerebellum. These results will be called to the ordering clinician or representative by the Radiologist Assistant, and communication documented in the PACS or [employer]. Electronically Signed   By: Feliberto Harts M.D.   On: 11/11/2023 20:27   CT CHEST W CONTRAST Result Date: 11/11/2023 CLINICAL DATA:  Liver lesions with history of breast cancer. EXAM: CT CHEST WITH CONTRAST TECHNIQUE: Multidetector CT imaging of the chest was performed during intravenous contrast administration. RADIATION  DOSE REDUCTION: This exam was performed according to the departmental dose-optimization program which includes automated exposure control, adjustment of the mA and/or kV according to patient size and/or use of iterative reconstruction technique. CONTRAST:  75mL OMNIPAQUE IOHEXOL 300 MG/ML  SOLN COMPARISON:  CT abdomen and pelvis 11/10/2023. FINDINGS: Cardiovascular: Heart is borderline enlarged/mildly enlarged. Aorta is normal in size. There is no pericardial effusion. There are atherosclerotic calcifications of the aorta. Mediastinum/Nodes: There is a heterogeneous left thyroid nodule attaining cystic and solid components measuring 2.6 x 1.5 cm. There are multiple enlarged right paratracheal lymph nodes measuring up to 2.0 x 2.9 cm. Enlarged subcarinal lymph node measures 1.2 cm short axis. There is a prominent right lower esophageal lymph node measuring 8 mm short axis. There are multiple enlarged right hilar lymph nodes measuring up to 1.4 x 1.8 cm. Visualized esophagus is within normal limits. Lungs/Pleura: Mild emphysematous changes are present. There is scarring in both lung apices. Multifocal airspace disease is seen throughout the right lower lobe, predominantly posteriorly and peripherally. Some areas are slightly nodular for example superior segment measuring 3.2  by 2.0 cm. A small air-fluid level seen in this region. No other air-fluid levels are seen. Additional nodular density seen in the right lower lobe image 5/92 measuring 9 mm. Smaller nodules are seen scattered throughout the right lower lobe measuring 5 mm or less. There is a trace right pleural effusion. There is a right middle lobe nodule measuring 5 mm image 5/122. There are prominent intrapulmonary lymph nodes along the right major fissure. There are 2 mm nodular densities in the left upper lobe. Left lung is otherwise clear. Upper Abdomen: Subcentimeter hypodensities in the liver and left adrenal mass are unchanged. Please see CT abdomen and pelvis performed same day for further description. Musculoskeletal: There is anterior intramuscular chest wall edema on the right. Focal density is seen in the medial right breast extending to the skin surface measuring 1.2 x 2.3 cm image 3/81. There some associated diffuse right breast skin thickening. No acute fracture. There is a single 5 mm sclerotic density in the T3 vertebral body. IMPRESSION: 1. Multifocal airspace disease throughout the right lower lobe, predominantly posteriorly and peripherally. Single area is slightly nodular in the superior segment containing an air-fluid level worrisome for necrotic pneumonia or necrotic nodule. Findings may be infectious/inflammatory, but neoplasm not excluded. 2. Trace right pleural effusion. 3. Mediastinal and right hilar lymphadenopathy. 4. Additional right lower lobe and right middle lobe pulmonary nodules measuring 5 mm. 5. Left thyroid nodule measuring 2.6 cm. Recommend further evaluation with ultrasound. 6. Focal density in the medial right breast extending to the skin surface measuring 1.2 x 2.3 cm. Correlate with physical exam. 7. Single 5 mm sclerotic density in the T3 vertebral body. Aortic Atherosclerosis (ICD10-I70.0) and Emphysema (ICD10-J43.9). Electronically Signed   By: Darliss Cheney M.D.   On: 11/11/2023  17:53   CT HEAD WO CONTRAST ( ) Result Date: 11/11/2023 CLINICAL DATA:  Right facial weakness. EXAM: CT HEAD WITHOUT CONTRAST TECHNIQUE: Contiguous axial images were obtained from the base of the skull through the vertex without intravenous contrast. RADIATION DOSE REDUCTION: This exam was performed according to the departmental dose-optimization program which includes automated exposure control, adjustment of the mA and/or kV according to patient size and/or use of iterative reconstruction technique. COMPARISON:  Head CT 07/21/2022 FINDINGS: Brain: There are new small hypodensities in the left frontoparietal white matter at the level of the posterior centrum semiovale (series 3, image 20), and there  is a new subcentimeter focus of cortical hypodensity at the level of the left parietal operculum (series 6, image 46), suspicious for recent infarcts. No intracranial hemorrhage, mass, midline shift, or extra-axial fluid collection is identified. Cerebral volume is normal. The ventricles are normal in size. Vascular: No hyperdense vessel. Skull: No acute fracture or suspicious lesion. Sinuses/Orbits: Visualized paranasal sinuses and mastoid air cells are clear. Bilateral cataract extraction. Other: None. 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 A brain MRI is in progress and will provide further evaluation. Electronically Signed   By: Sebastian Ache M.D.   On: 11/11/2023 15:13   DG Chest 2 View Result Date: 11/10/2023 CLINICAL DATA:  dyspnea EXAM: CHEST - 2 VIEW COMPARISON:  Chest x-ray 07/21/2022, chest x-ray 02/15/2021 FINDINGS: The heart and mediastinal contours are unchanged. Prominent hilar vasculature. No focal consolidation. No pulmonary edema. No pleural effusion. No pneumothorax. No acute osseous abnormality. IMPRESSION: Prominent hilar vasculature suggestive of pulmonary venous congestion. Underlying lymph nodes not excluded. Electronically Signed   By:  Tish Frederickson M.D.   On: 11/10/2023 23:50   CT ABDOMEN PELVIS W CONTRAST Result Date: 11/10/2023 CLINICAL DATA:  Acute nonlocalized abdominal pain. Vomiting and diarrhea. EXAM: CT ABDOMEN AND PELVIS WITH CONTRAST TECHNIQUE: Multidetector CT imaging of the abdomen and pelvis was performed using the standard protocol following bolus administration of intravenous contrast. RADIATION DOSE REDUCTION: This exam was performed according to the departmental dose-optimization program which includes automated exposure control, adjustment of the mA and/or kV according to patient size and/or use of iterative reconstruction technique. CONTRAST:  OMNIPAQUE IOHEXOL 300 MG/ML  SOLN COMPARISON:  None Available. FINDINGS: Lower chest: Focal consolidation suggested in the right lung base with small right pleural effusion. This may represent pneumonia or less likely aspiration. Emphysematous changes in the lungs. Hepatobiliary: Multiple scattered low-attenuation lesions in the liver, most are subcentimeter in size. Largest is in the medial segment left lobe measuring 1.3 cm in diameter. Enhancement pattern is indeterminate. Consider follow-up MRI for further characterization. The gallbladder is not visualized, likely surgically absent. Mild intrahepatic bile duct dilatation is most likely due to postoperative change. No common duct stones are identified. Pancreas: Unremarkable. No pancreatic ductal dilatation or surrounding inflammatory changes. Spleen: Segmental low-attenuation in the upper spleen likely represents a splenic infarct. Spleen size is normal. Adrenals/Urinary Tract: 1.3 cm diameter left adrenal gland nodule with density measurement of 69 Hounsfield units. Renal nephrograms are symmetrical. No solid mass identified. No hydronephrosis or hydroureter. Bladder is normal. Stomach/Bowel: Stomach is within normal limits. Appendix appears normal. No evidence of bowel wall thickening, distention, or inflammatory  changes. Vascular/Lymphatic: Aortic atherosclerosis. No enlarged abdominal or pelvic lymph nodes. Reproductive: Uterus and bilateral adnexa are unremarkable. Other: No abdominal wall hernia or abnormality. No abdominopelvic ascites. Musculoskeletal: Postoperative fixation of the right hip. No acute bony abnormalities. IMPRESSION: 1. Nonspecific low-attenuation lesions in the liver, largest measuring 1.3 cm diameter. Enhancement pattern is indeterminate. Consider follow-up MRI for further characterization. 2. Mild intrahepatic bile duct dilatation is likely postoperative in the setting of cholecystectomy. 3. Left adrenal mass measuring 1.3 cm, probable benign adenoma. Recommend follow-up adrenal washout CT in 1 year. If stable for = 1 year, no further follow-up imaging. JACR 2017 Aug; 14(8):1038-44, JCAT 2016 Mar-Apr; 40(2):194-200, Urol J 2006 Spring; 3(2):71-4. 4. Aortic atherosclerosis. 5. Focal area of consolidation suggested in the right lung base with small right pleural effusion, likely pneumonia. Electronically Signed   By: Marisa Cyphers.D.  On: 11/10/2023 23:30    Assessment and plan-   # Acute stroke Patient has atrial fibrillation and was on Eliquis 5 mg twice daily. Stroke is likely secondary to decreased Eliquis absorption due to her nausea vomiting diarrhea symptoms as well as possible hypercarbia state from recurrent malignancy. Neurology recommendation reviewed. PT OT   # Multiple liver lesions, suspect metastatic disease from breast cancer. Check tumor markers. Recommending liver lesion biopsy to minimize anticoagulation interruption.. Recommend to switch her anticoagulation from Eliquis to heparin gtt. Consult IR for evaluation.  # Atrial fibrillation Currently on heparin drip for anticoagulation.    # Multifocal airspace disease, mediastinal and right hilar lymphadenopathy, lung nodules.   Focal density in the medial right breast extending to the skin surface.  I do not  see a physical examination finding correlates to the CT finding. Will obtain outpatient PET scan for evaluation.  # Normocytic anemia, hemoglobin is stable.  No bleeding events. Iron panel is not consistent with iron deficiency. Monitor.  # Thyroid nodule: Outpatient workup.  Thank you for allowing me to participate in the care of this patient.   Rickard Patience, MD, PhD Hematology Oncology 11/13/2023

## 2023-11-13 NOTE — Progress Notes (Signed)
 PHARMACY - ANTICOAGULATION CONSULT NOTE  Pharmacy Consult for heparin drip Indication: atrial fibrillation   (on apixaban, last dose 3/27 @ 0807)  Allergies  Allergen Reactions   Cephalexin Hives   Strawberry Extract Hives    Patient Measurements: Height: 5\' 4"  (162.6 cm) Weight: 81.6 kg (180 lb) IBW/kg (Calculated) : 54.7 HEPARIN DW (KG): 72.4  Vital Signs: Temp: 98.5 F (36.9 C) (03/28 0908) Temp Source: Oral (03/28 0400) BP: 98/52 (03/28 1400) Pulse Rate: 73 (03/28 0908)  Labs: Recent Labs    11/10/23 2305 11/11/23 0041 11/11/23 0228 11/12/23 0555 11/12/23 1806 11/12/23 1806 11/13/23 0157 11/13/23 0853 11/13/23 1422  HGB  --  10.3*  --  8.7*  --   --  9.1*  --   --   HCT  --  31.0*  --  25.7*  --   --  26.7*  --   --   PLT  --  251  --  202  --   --  231  --   --   APTT  --   --   --   --  26   < > 56* 90* 87*  LABPROT  --   --   --   --   --   --  19.2*  --   --   INR  --   --   --   --   --   --  1.6*  --   --   HEPARINUNFRC  --   --   --   --  >1.10*  --   --  >1.10*  --   CREATININE  --  0.77  --  0.93  --   --  0.81  --   --   CKTOTAL 17*  --   --   --   --   --   --   --   --   TROPONINIHS 93*  --  94*  --   --   --   --   --   --    < > = values in this interval not displayed.    Estimated Creatinine Clearance: 67.8 mL/min (by C-G formula based on SCr of 0.81 mg/dL).   Medical History: Past Medical History:  Diagnosis Date   Anxiety    Aortic atherosclerosis (HCC)    Arthritis    Atrial fibrillation (HCC) 08/01/2020   a.) CHA2DS2-VASc = 4 (age, sex, HTN, aortic plaque). b.) chronically anticoagulated using apixaban   Breast cancer, right breast (HCC) 01/08/2021   a.) Stage IB (cT2cN0cM0) invasive mammary carcinoma of the RIGHT breast; grade I, ER/PR (+) and HER2/neu (+). Tx with neoadjuvant TCHP chemotherapy.   Carotid bruit    L --nl doppler 5/09- and again 5/13 with 0-39% stenosis bilat   Constipation    COPD (chronic obstructive pulmonary  disease) (HCC)    Diastolic dysfunction 08/02/2020   a.) TTE 08/02/2020: EF 60-65%; G1DD; triv MR/AR. b.) TTE 02/05/2021: ED 55-60%; G1DD; GLS -19.0%. c.) TTE 05/07/2021: TTE 55-60%; GLS -20.3%.   Family history of brain cancer    Family history of breast cancer    Family history of kidney cancer    Family history of lung cancer    Fatigue    Fracture of femoral neck, right (HCC) 2015   GERD (gastroesophageal reflux disease)    GI (gastrointestinal bleed)    [name]   Hepatitis    Hyperlipidemia    Hypertension    Left arm  pain    Leg pain    Chronic pain R leg from injury   Long term current use of anticoagulant    a.) apixaban   OSA and COPD overlap syndrome (HCC)    a.) no nocurnal PAP therapy; does utilize supplemental oxygen.   Osteopenia    Other organic sleep disorders    Supplemental oxygen dependent    Tobacco abuse     Medications:  Medications Prior to Admission  Medication Sig Dispense Refill Last Dose/Taking   acetaminophen (TYLENOL) 500 MG tablet Take 1,500 mg by mouth 2 (two) times daily as needed for moderate pain.      acidophilus (RISAQUAD) CAPS capsule Take 1 capsule by mouth daily. 10 capsule 0    albuterol (PROVENTIL) (2.5 MG/3ML) 0.083% nebulizer solution Take 2.5 mg by nebulization every 4 (four) hours as needed for wheezing or shortness of breath.       albuterol (VENTOLIN HFA) 108 (90 Base) MCG/ACT inhaler Inhale 2 puffs into the lungs every 6 (six) hours as needed for shortness of breath or wheezing.      alendronate (FOSAMAX) 70 MG tablet Take 70 mg by mouth every Sunday.      apixaban (ELIQUIS) 5 MG TABS tablet Take 1 tablet (5 mg total) by mouth 2 (two) times daily. 180 tablet 1    citalopram (CELEXA) 10 MG tablet Take 10 mg by mouth daily.      Cyanocobalamin (B-12) 1000 MCG CAPS Take 1,000 mcg by mouth daily.      diltiazem (CARDIZEM CD) 120 MG 24 hr capsule Take 1 capsule (120 mg total) by mouth daily. 90 capsule 3    GAVILAX 17 GM/SCOOP powder  SMARTSIG:17 Gram(s) By Mouth Daily PRN      lidocaine-prilocaine (EMLA) cream APPLY 1 APPLICATION TOPICALLY AS NEEDED. 30 g 6    Melatonin 5 MG CAPS Take 15 mg by mouth at bedtime.      naloxone (NARCAN) nasal spray 4 mg/0.1 mL Place 1 spray into the nose as needed (opioid overdose). (Patient not taking: Reported on 06/24/2023)      ondansetron (ZOFRAN) 8 MG tablet Take 1 tablet (8 mg total) by mouth every 8 (eight) hours as needed for nausea (Nausea or vomiting). Start on the third day after chemotherapy. 90 tablet 0    oxyCODONE (OXY IR/ROXICODONE) 5 MG immediate release tablet Take 5 mg by mouth every 6 (six) hours as needed.      pregabalin (LYRICA) 150 MG capsule Take 1 capsule (150 mg total) by mouth 2 (two) times daily. 90 capsule 0    roflumilast (DALIRESP) 500 MCG TABS tablet Take 1 tablet by mouth daily.      tamoxifen (NOLVADEX) 20 MG tablet Take 1 tablet (20 mg total) by mouth daily. 90 tablet 1    tiotropium (SPIRIVA) 18 MCG inhalation capsule Place 18 mcg into inhaler and inhale daily.      TRELEGY ELLIPTA 100-62.5-25 MCG/INH AEPB Inhale 1 puff into the lungs daily.      Scheduled:   acidophilus  1 capsule Oral Daily   atorvastatin  80 mg Oral Daily   citalopram  10 mg Oral Daily   cyanocobalamin  1,000 mcg Oral Daily   diltiazem  120 mg Oral Daily   feeding supplement  237 mL Oral TID BM   fluticasone furoate-vilanterol  1 puff Inhalation Daily   And   umeclidinium bromide  1 puff Inhalation Daily   guaiFENesin  600 mg Oral BID   [START  ON 11/14/2023] pantoprazole  40 mg Oral Daily   pregabalin  150 mg Oral BID   roflumilast  500 mcg Oral Daily   sodium chloride flush  10-40 mL Intracatheter Q12H   sodium chloride flush  3-10 mL Intravenous Q12H   Infusions:   heparin 1,250 Units/hr (11/13/23 1514)    Assessment: 68 yo F on apixaban for Afib now being transitioned to heparin drip (anticipate liver lesion biopsy per Oncology note).  Last dose of apixaban 5 mg given 3/27  @ 0807.   3/28 0157 aPTT 56 3/28 0853 aPTT 90 Heparin level > 1.1 3/28 1422 aPTT 87, therapeutic x 2  Goal of Therapy:  Heparin level 0.3-0.7 units/ml once aPTT and heparin level correlated.  aPTT 66-102 seconds Monitor platelets by anticoagulation protocol: Yes   Plan:  aPTT is therapeutic x 2.  Will continue heparin infusion at 1250 units/hr.  Avoid bolus due to CVA.  Recheck aPTT with AM labs  Daily Heparin level and CBC while on heparin.   Barrie Folk, PharmD, BCPS Clinical Pharmacist 11/13/2023

## 2023-11-13 NOTE — Progress Notes (Signed)
 PHARMACY - ANTICOAGULATION CONSULT NOTE  Pharmacy Consult for heparin drip Indication: atrial fibrillation   (on apixaban, last dose 3/27 @ 0807)  Allergies  Allergen Reactions   Cephalexin Hives   Strawberry Extract Hives    Patient Measurements: Height: 5\' 4"  (162.6 cm) Weight: 81.6 kg (180 lb) IBW/kg (Calculated) : 54.7 HEPARIN DW (KG): 72.4  Vital Signs: Temp: 98.5 F (36.9 C) (03/28 0908) Temp Source: Oral (03/28 0400) BP: 106/52 (03/28 0908) Pulse Rate: 73 (03/28 0908)  Labs: Recent Labs    11/10/23 2305 11/11/23 0041 11/11/23 0228 11/12/23 0555 11/12/23 1806 11/13/23 0157 11/13/23 0853  HGB  --  10.3*  --  8.7*  --  9.1*  --   HCT  --  31.0*  --  25.7*  --  26.7*  --   PLT  --  251  --  202  --  231  --   APTT  --   --   --   --  26 56* 90*  LABPROT  --   --   --   --   --  19.2*  --   INR  --   --   --   --   --  1.6*  --   HEPARINUNFRC  --   --   --   --  >1.10*  --  >1.10*  CREATININE  --  0.77  --  0.93  --  0.81  --   CKTOTAL 17*  --   --   --   --   --   --   TROPONINIHS 93*  --  94*  --   --   --   --     Estimated Creatinine Clearance: 67.8 mL/min (by C-G formula based on SCr of 0.81 mg/dL).   Medical History: Past Medical History:  Diagnosis Date   Anxiety    Aortic atherosclerosis (HCC)    Arthritis    Atrial fibrillation (HCC) 08/01/2020   a.) CHA2DS2-VASc = 4 (age, sex, HTN, aortic plaque). b.) chronically anticoagulated using apixaban   Breast cancer, right breast (HCC) 01/08/2021   a.) Stage IB (cT2cN0cM0) invasive mammary carcinoma of the RIGHT breast; grade I, ER/PR (+) and HER2/neu (+). Tx with neoadjuvant TCHP chemotherapy.   Carotid bruit    L --nl doppler 5/09- and again 5/13 with 0-39% stenosis bilat   Constipation    COPD (chronic obstructive pulmonary disease) (HCC)    Diastolic dysfunction 08/02/2020   a.) TTE 08/02/2020: EF 60-65%; G1DD; triv MR/AR. b.) TTE 02/05/2021: ED 55-60%; G1DD; GLS -19.0%. c.) TTE 05/07/2021: TTE  55-60%; GLS -20.3%.   Family history of brain cancer    Family history of breast cancer    Family history of kidney cancer    Family history of lung cancer    Fatigue    Fracture of femoral neck, right (HCC) 2015   GERD (gastroesophageal reflux disease)    GI (gastrointestinal bleed)    [name]   Hepatitis    Hyperlipidemia    Hypertension    Left arm pain    Leg pain    Chronic pain R leg from injury   Long term current use of anticoagulant    a.) apixaban   OSA and COPD overlap syndrome (HCC)    a.) no nocurnal PAP therapy; does utilize supplemental oxygen.   Osteopenia    Other organic sleep disorders    Supplemental oxygen dependent    Tobacco abuse     Medications:  Medications Prior to Admission  Medication Sig Dispense Refill Last Dose/Taking   acetaminophen (TYLENOL) 500 MG tablet Take 1,500 mg by mouth 2 (two) times daily as needed for moderate pain.      acidophilus (RISAQUAD) CAPS capsule Take 1 capsule by mouth daily. 10 capsule 0    albuterol (PROVENTIL) (2.5 MG/3ML) 0.083% nebulizer solution Take 2.5 mg by nebulization every 4 (four) hours as needed for wheezing or shortness of breath.       albuterol (VENTOLIN HFA) 108 (90 Base) MCG/ACT inhaler Inhale 2 puffs into the lungs every 6 (six) hours as needed for shortness of breath or wheezing.      alendronate (FOSAMAX) 70 MG tablet Take 70 mg by mouth every Sunday.      apixaban (ELIQUIS) 5 MG TABS tablet Take 1 tablet (5 mg total) by mouth 2 (two) times daily. 180 tablet 1    citalopram (CELEXA) 10 MG tablet Take 10 mg by mouth daily.      Cyanocobalamin (B-12) 1000 MCG CAPS Take 1,000 mcg by mouth daily.      diltiazem (CARDIZEM CD) 120 MG 24 hr capsule Take 1 capsule (120 mg total) by mouth daily. 90 capsule 3    GAVILAX 17 GM/SCOOP powder SMARTSIG:17 Gram(s) By Mouth Daily PRN      lidocaine-prilocaine (EMLA) cream APPLY 1 APPLICATION TOPICALLY AS NEEDED. 30 g 6    Melatonin 5 MG CAPS Take 15 mg by mouth at  bedtime.      naloxone (NARCAN) nasal spray 4 mg/0.1 mL Place 1 spray into the nose as needed (opioid overdose). (Patient not taking: Reported on 06/24/2023)      ondansetron (ZOFRAN) 8 MG tablet Take 1 tablet (8 mg total) by mouth every 8 (eight) hours as needed for nausea (Nausea or vomiting). Start on the third day after chemotherapy. 90 tablet 0    oxyCODONE (OXY IR/ROXICODONE) 5 MG immediate release tablet Take 5 mg by mouth every 6 (six) hours as needed.      pregabalin (LYRICA) 150 MG capsule Take 1 capsule (150 mg total) by mouth 2 (two) times daily. 90 capsule 0    roflumilast (DALIRESP) 500 MCG TABS tablet Take 1 tablet by mouth daily.      tamoxifen (NOLVADEX) 20 MG tablet Take 1 tablet (20 mg total) by mouth daily. 90 tablet 1    tiotropium (SPIRIVA) 18 MCG inhalation capsule Place 18 mcg into inhaler and inhale daily.      TRELEGY ELLIPTA 100-62.5-25 MCG/INH AEPB Inhale 1 puff into the lungs daily.      Scheduled:   acidophilus  1 capsule Oral Daily   Chlorhexidine Gluconate Cloth  6 each Topical Daily   citalopram  10 mg Oral Daily   cyanocobalamin  1,000 mcg Oral Daily   diltiazem  120 mg Oral Daily   feeding supplement  237 mL Oral TID BM   fluticasone furoate-vilanterol  1 puff Inhalation Daily   And   umeclidinium bromide  1 puff Inhalation Daily   guaiFENesin  600 mg Oral BID   pantoprazole (PROTONIX) IV  40 mg Intravenous Q12H   pregabalin  150 mg Oral BID   roflumilast  500 mcg Oral Daily   sodium chloride flush  10-40 mL Intracatheter Q12H   Infusions:   heparin 1,250 Units/hr (11/13/23 0314)    Assessment: 68 yo F on apixaban for Afib now being transitioned to heparin drip (anticipate liver lesion biopsy per Oncology note).  Last dose of apixaban 5  mg given 3/27 @ 0807.   3/28 0157 aPTT 56 3/28 0853 aPTT 90 Heparin level > 1.1   Goal of Therapy:  Heparin level 0.3-0.7 units/ml once aPTT and heparin level correlated.  aPTT 66-102 seconds Monitor platelets  by anticoagulation protocol: Yes   Plan:  aPTT is therapeutic. Will continue heparin infusion at 1250 units/hr. Avoid bolus due to CVA. Recheck aPTT in 6 hours. Daily Heparin level and CBC while on heparin.   Ronnald Ramp, PharmD, BCPS Clinical Pharmacist 11/13/2023

## 2023-11-13 NOTE — Plan of Care (Signed)
  Problem: Nutrition: Goal: Adequate nutrition will be maintained Outcome: Progressing   Problem: Coping: Goal: Level of anxiety will decrease Outcome: Progressing   Problem: Elimination: Goal: Will not experience complications related to bowel motility Outcome: Progressing   Problem: Pain Managment: Goal: General experience of comfort will improve and/or be controlled Outcome: Progressing

## 2023-11-13 NOTE — Progress Notes (Addendum)
 Pt is complaining of 10/10 on pt legs. NP Jon Billings made aware. Will continue to monitor.  Update 0209: See new orders. Will continue to monitor.

## 2023-11-13 NOTE — Progress Notes (Signed)
 PHARMACY - ANTICOAGULATION CONSULT NOTE  Pharmacy Consult for heparin drip Indication: atrial fibrillation   (on apixaban, last dose 3/27 @ 0807)  Allergies  Allergen Reactions   Cephalexin Hives   Strawberry Extract Hives    Patient Measurements: Height: 5\' 4"  (162.6 cm) Weight: 81.6 kg (180 lb) IBW/kg (Calculated) : 54.7 HEPARIN DW (KG): 72.4  Vital Signs: Temp: 98.3 F (36.8 C) (03/28 0047) Temp Source: Oral (03/27 2330) BP: 116/66 (03/28 0047) Pulse Rate: 89 (03/28 0047)  Labs: Recent Labs    11/10/23 2305 11/11/23 0041 11/11/23 0228 11/12/23 0555 11/12/23 1806 11/13/23 0157  HGB  --  10.3*  --  8.7*  --  9.1*  HCT  --  31.0*  --  25.7*  --  26.7*  PLT  --  251  --  202  --  231  APTT  --   --   --   --  26 56*  LABPROT  --   --   --   --   --  19.2*  INR  --   --   --   --   --  1.6*  HEPARINUNFRC  --   --   --   --  >1.10*  --   CREATININE  --  0.77  --  0.93  --  0.81  CKTOTAL 17*  --   --   --   --   --   TROPONINIHS 93*  --  94*  --   --   --     Estimated Creatinine Clearance: 67.8 mL/min (by C-G formula based on SCr of 0.81 mg/dL).   Medical History: Past Medical History:  Diagnosis Date   Anxiety    Aortic atherosclerosis (HCC)    Arthritis    Atrial fibrillation (HCC) 08/01/2020   a.) CHA2DS2-VASc = 4 (age, sex, HTN, aortic plaque). b.) chronically anticoagulated using apixaban   Breast cancer, right breast (HCC) 01/08/2021   a.) Stage IB (cT2cN0cM0) invasive mammary carcinoma of the RIGHT breast; grade I, ER/PR (+) and HER2/neu (+). Tx with neoadjuvant TCHP chemotherapy.   Carotid bruit    L --nl doppler 5/09- and again 5/13 with 0-39% stenosis bilat   Constipation    COPD (chronic obstructive pulmonary disease) (HCC)    Diastolic dysfunction 08/02/2020   a.) TTE 08/02/2020: EF 60-65%; G1DD; triv MR/AR. b.) TTE 02/05/2021: ED 55-60%; G1DD; GLS -19.0%. c.) TTE 05/07/2021: TTE 55-60%; GLS -20.3%.   Family history of brain cancer    Family  history of breast cancer    Family history of kidney cancer    Family history of lung cancer    Fatigue    Fracture of femoral neck, right (HCC) 2015   GERD (gastroesophageal reflux disease)    GI (gastrointestinal bleed)    [name]   Hepatitis    Hyperlipidemia    Hypertension    Left arm pain    Leg pain    Chronic pain R leg from injury   Long term current use of anticoagulant    a.) apixaban   OSA and COPD overlap syndrome (HCC)    a.) no nocurnal PAP therapy; does utilize supplemental oxygen.   Osteopenia    Other organic sleep disorders    Supplemental oxygen dependent    Tobacco abuse     Medications:  Medications Prior to Admission  Medication Sig Dispense Refill Last Dose/Taking   acetaminophen (TYLENOL) 500 MG tablet Take 1,500 mg by mouth 2 (two) times daily  as needed for moderate pain.      acidophilus (RISAQUAD) CAPS capsule Take 1 capsule by mouth daily. 10 capsule 0    albuterol (PROVENTIL) (2.5 MG/3ML) 0.083% nebulizer solution Take 2.5 mg by nebulization every 4 (four) hours as needed for wheezing or shortness of breath.       albuterol (VENTOLIN HFA) 108 (90 Base) MCG/ACT inhaler Inhale 2 puffs into the lungs every 6 (six) hours as needed for shortness of breath or wheezing.      alendronate (FOSAMAX) 70 MG tablet Take 70 mg by mouth every Sunday.      apixaban (ELIQUIS) 5 MG TABS tablet Take 1 tablet (5 mg total) by mouth 2 (two) times daily. 180 tablet 1    citalopram (CELEXA) 10 MG tablet Take 10 mg by mouth daily.      Cyanocobalamin (B-12) 1000 MCG CAPS Take 1,000 mcg by mouth daily.      diltiazem (CARDIZEM CD) 120 MG 24 hr capsule Take 1 capsule (120 mg total) by mouth daily. 90 capsule 3    GAVILAX 17 GM/SCOOP powder SMARTSIG:17 Gram(s) By Mouth Daily PRN      lidocaine-prilocaine (EMLA) cream APPLY 1 APPLICATION TOPICALLY AS NEEDED. 30 g 6    Melatonin 5 MG CAPS Take 15 mg by mouth at bedtime.      naloxone (NARCAN) nasal spray 4 mg/0.1 mL Place 1  spray into the nose as needed (opioid overdose). (Patient not taking: Reported on 06/24/2023)      ondansetron (ZOFRAN) 8 MG tablet Take 1 tablet (8 mg total) by mouth every 8 (eight) hours as needed for nausea (Nausea or vomiting). Start on the third day after chemotherapy. 90 tablet 0    oxyCODONE (OXY IR/ROXICODONE) 5 MG immediate release tablet Take 5 mg by mouth every 6 (six) hours as needed.      pregabalin (LYRICA) 150 MG capsule Take 1 capsule (150 mg total) by mouth 2 (two) times daily. 90 capsule 0    roflumilast (DALIRESP) 500 MCG TABS tablet Take 1 tablet by mouth daily.      tamoxifen (NOLVADEX) 20 MG tablet Take 1 tablet (20 mg total) by mouth daily. 90 tablet 1    tiotropium (SPIRIVA) 18 MCG inhalation capsule Place 18 mcg into inhaler and inhale daily.      TRELEGY ELLIPTA 100-62.5-25 MCG/INH AEPB Inhale 1 puff into the lungs daily.      Scheduled:   acidophilus  1 capsule Oral Daily   Chlorhexidine Gluconate Cloth  6 each Topical Daily   citalopram  10 mg Oral Daily   cyanocobalamin  1,000 mcg Oral Daily   diltiazem  120 mg Oral Daily   feeding supplement  237 mL Oral TID BM   fluticasone furoate-vilanterol  1 puff Inhalation Daily   And   umeclidinium bromide  1 puff Inhalation Daily   guaiFENesin  600 mg Oral BID   heparin  1,100 Units Intravenous Once   pantoprazole (PROTONIX) IV  40 mg Intravenous Q12H   pregabalin  150 mg Oral BID   roflumilast  500 mcg Oral Daily   sodium chloride flush  10-40 mL Intracatheter Q12H   Infusions:   heparin 1,100 Units/hr (11/12/23 2012)    Assessment: 68 yo F on apixaban for Afib now being transitioned to heparin drip (anticipate liver lesion biopsy per Oncology note).  Last dose of apixaban 5 mg given 3/27 @ 0807 Baseline:  aPTT 26 and Heparin level >1.10  Hgb 8.7  Plt 202  Goal of Therapy:  Heparin level 0.3-0.7 units/ml aPTT 66-102 seconds Monitor platelets by anticoagulation protocol: Yes   Plan:  3/28:  aPTT @ 0157  = 56, SUBtherapeutic - will order heparin 1100 units IV X 1 bolus and increase drip rate to 1250 units/hr - will recheck aPTT and HL 6 hrs after rate change Check HL and aPTT daily until correlating Continue to monitor H&H and platelets  Oneita Allmon D Clinical Pharmacist 11/13/2023

## 2023-11-13 NOTE — TOC Progression Note (Signed)
 Transition of Care [facility]) - Progression Note    Patient Details  Name: Debra [name] MRN: [id number] Date of Birth: 09-Mar-1956  Transition of [facility] CM/SW Contact  Truddie Hidden, RN Phone Number: 11/13/2023, 3:50 PM  Clinical Narrative:    Attempt to speak with patient regarding therapy's recommendation for [facility]. Patient is sleeping.         Expected Discharge Plan and Services                                               Social Determinants of Health (SDOH) Interventions SDOH Screenings   Food Insecurity: No Food Insecurity (11/11/2023)  Housing: Unknown (11/11/2023)  Transportation Needs: No Transportation Needs (11/11/2023)  Utilities: Not At Risk (11/11/2023)  Financial Resource Strain: Low Risk  (06/15/2023)   Received from [facility]  Physical Activity: Unknown (12/17/2017)  Social Connections: Moderately Isolated (11/11/2023)  Stress: No Stress Concern Present (12/17/2017)  Tobacco Use: Medium Risk (11/10/2023)    Readmission Risk Interventions     No data to display

## 2023-11-13 NOTE — Plan of Care (Signed)
  Problem: Education: Goal: Knowledge of General Education information will improve Description: Including pain rating scale, medication(s)/side effects and non-pharmacologic comfort measures 11/13/2023 0322 by Myles Gip, RN Outcome: Progressing 11/12/2023 2307 by Myles Gip, RN Outcome: Progressing   Problem: Clinical Measurements: Goal: Respiratory complications will improve 11/13/2023 0322 by Myles Gip, RN Outcome: Progressing 11/12/2023 2307 by Myles Gip, RN Outcome: Progressing   Problem: Clinical Measurements: Goal: Cardiovascular complication will be avoided 11/13/2023 0322 by Myles Gip, RN Outcome: Progressing 11/12/2023 2307 by Myles Gip, RN Outcome: Progressing   Problem: Elimination: Goal: Will not experience complications related to urinary retention Outcome: Progressing   Problem: Pain Managment: Goal: General experience of comfort will improve and/or be controlled 11/13/2023 0322 by Myles Gip, RN Outcome: Progressing 11/12/2023 2307 by Myles Gip, RN Outcome: Progressing   Problem: Safety: Goal: Ability to remain free from injury will improve 11/13/2023 0322 by Myles Gip, RN Outcome: Progressing 11/12/2023 2307 by Myles Gip, RN Outcome: Progressing   Problem: Education: Goal: Knowledge of disease or condition will improve Outcome: Progressing   Problem: Education: Goal: Knowledge of secondary prevention will improve (MUST DOCUMENT ALL) Outcome: Progressing   Problem: Education: Goal: Knowledge of patient specific risk factors will improve (DELETE if not current risk factor) Outcome: Progressing

## 2023-11-13 NOTE — Plan of Care (Signed)
  Problem: Education: Goal: Knowledge of General Education information will improve Description: Including pain rating scale, medication(s)/side effects and non-pharmacologic comfort measures Outcome: Progressing   Problem: Health Behavior/Discharge Planning: Goal: Ability to manage health-related needs will improve Outcome: Progressing   Problem: Clinical Measurements: Goal: Ability to maintain clinical measurements within normal limits will improve Outcome: Progressing Goal: Will remain free from infection Outcome: Progressing Goal: Diagnostic test results will improve Outcome: Progressing Goal: Respiratory complications will improve Outcome: Progressing Goal: Cardiovascular complication will be avoided Outcome: Progressing   Problem: Activity: Goal: Risk for activity intolerance will decrease Outcome: Progressing   Problem: Nutrition: Goal: Adequate nutrition will be maintained Outcome: Progressing   Problem: Coping: Goal: Level of anxiety will decrease Outcome: Progressing   Problem: Elimination: Goal: Will not experience complications related to bowel motility Outcome: Progressing Goal: Will not experience complications related to urinary retention Outcome: Progressing   Problem: Pain Managment: Goal: General experience of comfort will improve and/or be controlled Outcome: Progressing   Problem: Safety: Goal: Ability to remain free from injury will improve Outcome: Progressing   Problem: Skin Integrity: Goal: Risk for impaired skin integrity will decrease Outcome: Progressing   Problem: Activity: Goal: Ability to tolerate increased activity will improve Outcome: Progressing   Problem: Clinical Measurements: Goal: Ability to maintain a body temperature in the normal range will improve Outcome: Progressing   Problem: Respiratory: Goal: Ability to maintain adequate ventilation will improve Outcome: Progressing Goal: Ability to maintain a clear airway  will improve Outcome: Progressing   Problem: Education: Goal: Knowledge of disease or condition will improve Outcome: Progressing Goal: Knowledge of secondary prevention will improve (MUST DOCUMENT ALL) Outcome: Progressing Goal: Knowledge of patient specific risk factors will improve (DELETE if not current risk factor) Outcome: Progressing   Problem: Ischemic Stroke/TIA Tissue Perfusion: Goal: Complications of ischemic stroke/TIA will be minimized Outcome: Progressing   Problem: Coping: Goal: Will verbalize positive feelings about self Outcome: Progressing Goal: Will identify appropriate support needs Outcome: Progressing   Problem: Health Behavior/Discharge Planning: Goal: Ability to manage health-related needs will improve Outcome: Progressing Goal: Goals will be collaboratively established with patient/family Outcome: Progressing   Problem: Self-Care: Goal: Ability to participate in self-care as condition permits will improve Outcome: Progressing Goal: Verbalization of feelings and concerns over difficulty with self-care will improve Outcome: Progressing Goal: Ability to communicate needs accurately will improve Outcome: Progressing   Problem: Nutrition: Goal: Risk of aspiration will decrease Outcome: Progressing Goal: Dietary intake will improve Outcome: Progressing

## 2023-11-13 NOTE — Progress Notes (Signed)
 [facility]  - [place] at [facility]   PATIENT NAME: Debra [name]    MR#:  [id number]  DATE OF BIRTH:  1955-09-26  SUBJECTIVE:  seen earlier in the day. Husband at bedside. Patient ambulated from bed to chair with PT. Currently on IV heparin drip for anticipated liver biopsy on Monday    VITALS:  Blood pressure (!) 99/55, pulse 79, temperature 98.4 F (36.9 C), resp. rate 20, height 5\' 4"  (1.626 m), weight 81.6 kg, SpO2 100%.  PHYSICAL EXAMINATION:   GENERAL:  68 y.o.-year-old patient with no acute distress. Obese LUNGS: Normal breath sounds bilaterally, no wheezing CARDIOVASCULAR: S1, S2 normal. No murmur   ABDOMEN: Soft, nontender, nondistended. Bowel sounds present.  EXTREMITIES: No  edema b/l.    NEUROLOGIC: nonfocal  patient is alert and awake, mild facial drool and mild SKIN: No obvious rash, lesion, or ulcer.   LABORATORY PANEL:  CBC Recent Labs  Lab 11/13/23 0157  WBC 9.3  HGB 9.1*  HCT 26.7*  PLT 231    Chemistries  Recent Labs  Lab 11/13/23 0157  NA 142  K 3.7  CL 108  CO2 27  GLUCOSE 110*  BUN 9  CREATININE 0.81  CALCIUM 8.7*  AST 68*  ALT 113*  ALKPHOS 49  BILITOT 0.6   Cardiac Enzymes No results for input(s): "TROPONINI" in the last 168 hours. RADIOLOGY:  ECHOCARDIOGRAM COMPLETE Result Date: 11/12/2023    ECHOCARDIOGRAM REPORT   Patient Name:   Debra [name] Date of Exam: 11/11/2023 Medical Rec #:  [id number]    Height:       64.0 in Accession #:    [id number]   Weight:       180.0 lb Date of Birth:  04-Nov-1955     BSA:          1.871 m Patient Age:    68 years     BP:           110/92 mmHg Patient Gender: F            HR:           80 bpm. Exam Location:  [facility] Procedure: 2D Echo, Cardiac Doppler and Color Doppler (Both Spectral and Color            Flow Doppler were utilized during procedure). Indications:     Elevated Troponin  History:         Patient has prior history of Echocardiogram examinations, most                  recent  04/25/2022. COPD, Arrythmias:Atrial Fibrillation; Risk                  Factors:Hypertension and Dyslipidemia. Obstructive sleep apnea.  Sonographer:     Daphine Deutscher RDCS Referring Phys:  [id number] Wilfred Curtis [facility] Diagnosing Phys: Julien Nordmann MD IMPRESSIONS  1. Left ventricular ejection fraction, by estimation, is 60 to 65%. The left ventricle has normal function. The left ventricle has no regional wall motion abnormalities. There is mild left ventricular hypertrophy. Left ventricular diastolic parameters are consistent with Grade I diastolic dysfunction (impaired relaxation).  2. Right ventricular systolic function is normal. The right ventricular size is normal. There is normal pulmonary artery systolic pressure. The estimated right ventricular systolic pressure is 21.3 mmHg.  3. The mitral valve is normal in structure. Mild to moderate mitral valve regurgitation. No evidence of mitral stenosis.  4. The aortic valve is normal in structure.  Aortic valve regurgitation is not visualized. No aortic stenosis is present.  5. The inferior vena cava is normal in size with greater than 50% respiratory variability, suggesting right atrial pressure of 3 mmHg. FINDINGS  Left Ventricle: Left ventricular ejection fraction, by estimation, is 60 to 65%. The left ventricle has normal function. The left ventricle has no regional wall motion abnormalities. Strain was performed and the global longitudinal strain is indeterminate. The left ventricular internal cavity size was normal in size. There is mild left ventricular hypertrophy. Left ventricular diastolic parameters are consistent with Grade I diastolic dysfunction (impaired relaxation). Right Ventricle: The right ventricular size is normal. No increase in right ventricular wall thickness. Right ventricular systolic function is normal. There is normal pulmonary artery systolic pressure. The tricuspid regurgitant velocity is 2.02 m/s, and  with an assumed right atrial  pressure of 5 mmHg, the estimated right ventricular systolic pressure is 21.3 mmHg. Left Atrium: Left atrial size was normal in size. Right Atrium: Right atrial size was normal in size. Pericardium: There is no evidence of pericardial effusion. Mitral Valve: The mitral valve is normal in structure. Mild to moderate mitral valve regurgitation. No evidence of mitral valve stenosis. Tricuspid Valve: The tricuspid valve is normal in structure. Tricuspid valve regurgitation is not demonstrated. No evidence of tricuspid stenosis. Aortic Valve: The aortic valve is normal in structure. Aortic valve regurgitation is not visualized. No aortic stenosis is present. Pulmonic Valve: The pulmonic valve was normal in structure. Pulmonic valve regurgitation is not visualized. No evidence of pulmonic stenosis. Aorta: The aortic root is normal in size and structure. Venous: The inferior vena cava is normal in size with greater than 50% respiratory variability, suggesting right atrial pressure of 3 mmHg. IAS/Shunts: No atrial level shunt detected by color flow Doppler. Additional Comments: 3D was performed not requiring image post processing on an independent workstation and was indeterminate.  LEFT VENTRICLE PLAX 2D LVIDd:         4.90 cm   Diastology LVIDs:         3.10 cm   LV e' medial:    9.36 cm/s LV PW:         1.00 cm   LV E/e' medial:  9.1 LV IVS:        1.00 cm   LV e' lateral:   9.32 cm/s LVOT diam:     2.00 cm   LV E/e' lateral: 9.2 LV SV:         56 LV SV Index:   30 LVOT Area:     3.14 cm  RIGHT VENTRICLE RV Basal diam:  3.30 cm RV S prime:     18.33 cm/s TAPSE (M-mode): 3.2 cm LEFT ATRIUM             Index        RIGHT ATRIUM           Index LA diam:        3.90 cm 2.08 cm/m   RA Area:     11.60 cm LA Vol (A2C):   50.9 ml 27.21 ml/m  RA Volume:   27.60 ml  14.75 ml/m LA Vol (A4C):   38.2 ml 20.42 ml/m LA Biplane Vol: 44.4 ml 23.73 ml/m  AORTIC VALVE LVOT Vmax:   98.00 cm/s LVOT Vmean:  64.000 cm/s LVOT VTI:     0.179 m  AORTA Ao Root diam: 3.30 cm MITRAL VALVE  TRICUSPID VALVE MV Area (PHT): 3.65 cm     TR Peak grad:   16.3 mmHg MV Decel Time: 208 msec     TR Vmax:        202.00 cm/s MR Peak grad: 82.4 mmHg MR Vmax:      454.00 cm/s   SHUNTS MV E velocity: 85.55 cm/s   Systemic VTI:  0.18 m MV A velocity: 123.50 cm/s  Systemic Diam: 2.00 cm MV E/A ratio:  0.69 Julien Nordmann MD Electronically signed by Julien Nordmann MD Signature Date/Time: 11/12/2023/1:59:01 PM    Final    MR ABDOMEN MRCP W WO CONTAST Result Date: 11/12/2023 CLINICAL DATA:  Evaluate liver lesions.  Chest mass. EXAM: MRI ABDOMEN WITHOUT AND WITH CONTRAST (INCLUDING MRCP) TECHNIQUE: Multiplanar multisequence MR imaging of the abdomen was performed both before and after the administration of intravenous contrast. Heavily T2-weighted images of the biliary and pancreatic ducts were obtained, and three-dimensional MRCP images were rendered by post processing. CONTRAST:  8mL GADAVIST GADOBUTROL 1 MMOL/ML IV SOLN COMPARISON:  CT AP 11/10/2023 FINDINGS: Lower chest: Small right pleural effusion. Partially visualized mass within the superior segment of right lower lobe noted. Hepatobiliary: There are multifocal bilobar, ill-defined areas of mild increased T2 signal noted within both lobes of liver which show hypoenhancement on the postcontrast images and restricted diffusion. At least 5 lesions are noted involving both lobes. The largest lesion is in segment 5 measuring 1.1 cm, image 17/8. Lesion in segment 2/3 measures 6 mm, image 10/8. Status post cholecystectomy. Mild intrahepatic bile duct dilatation. Fusiform dilatation of the common bile duct which measures 1.3 cm proximally. No signs of choledocholithiasis or mass. Pancreas:  No main duct dilatation, inflammation or mass identified. Spleen: Within the proximal portion of the spleen there are 2 wedge-shaped areas of peripheral hypoenhancement, image 25/31. Adrenals/Urinary Tract: There is a  1.3 cm nodule in the left adrenal gland which is indeterminate with only mild loss of signal on the out of phase sequences, image 29/9. This is a new finding when compared with the remote CT of the abdomen pelvis from 09/17/2020. Normal right adrenal gland. No kidney mass or signs of obstructive uropathy. Stomach/Bowel: Visualized portions within the abdomen are unremarkable. Vascular/Lymphatic: No aneurysm. Aortic atherosclerosis. No adenopathy. Other:  No ascites. Musculoskeletal: Multifocal abnormal areas of enhancement are identified throughout the visualized portions of the thoracic and lumbar spine which are concerning for osseous metastasis. IMPRESSION: 1. There are multifocal bilobar, ill-defined areas of mild increased T2 signal noted within both lobes of liver which show hypoenhancement on the postcontrast images and restricted diffusion. At least 5 lesions are noted involving both lobes. 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 2. Multifocal abnormal areas of enhancement are identified throughout the visualized portions of the thoracic and lumbar spine which are concerning for osseous metastasis. 3. There are 2 wedge-shaped areas of peripheral hypoenhancement within the proximal portion of the spleen which are concerning for splenic infarcts. 4. There is a 1.3 cm nodule in the left adrenal gland which is indeterminate with only mild loss of signal on the out of phase sequences. This is a new finding when compared with the remote CT of the abdomen pelvis from 09/17/2020. Differential considerations include lipid poor adenoma versus a adrenal metastasis. 5. Small right pleural effusion. 6. Partially visualized mass within the superior segment of right lower lobe. 7. Status post cholecystectomy. Mild intrahepatic bile duct dilatation. Fusiform dilatation of the common bile duct which measures 1.3 cm proximally. No signs  of choledocholithiasis or mass. Electronically Signed   By: Signa Kell M.D.    On: 11/12/2023 05:07   MR 3D Recon At Scanner Result Date: 11/12/2023 CLINICAL DATA:  Evaluate liver lesions.  Chest mass. EXAM: MRI ABDOMEN WITHOUT AND WITH CONTRAST (INCLUDING MRCP) TECHNIQUE: Multiplanar multisequence MR imaging of the abdomen was performed both before and after the administration of intravenous contrast. Heavily T2-weighted images of the biliary and pancreatic ducts were obtained, and three-dimensional MRCP images were rendered by post processing. CONTRAST:  8mL GADAVIST GADOBUTROL 1 MMOL/ML IV SOLN COMPARISON:  CT AP 11/10/2023 FINDINGS: Lower chest: Small right pleural effusion. Partially visualized mass within the superior segment of right lower lobe noted. Hepatobiliary: There are multifocal bilobar, ill-defined areas of mild increased T2 signal noted within both lobes of liver which show hypoenhancement on the postcontrast images and restricted diffusion. At least 5 lesions are noted involving both lobes. The largest lesion is in segment 5 measuring 1.1 cm, image 17/8. Lesion in segment 2/3 measures 6 mm, image 10/8. Status post cholecystectomy. Mild intrahepatic bile duct dilatation. Fusiform dilatation of the common bile duct which measures 1.3 cm proximally. No signs of choledocholithiasis or mass. Pancreas:  No main duct dilatation, inflammation or mass identified. Spleen: Within the proximal portion of the spleen there are 2 wedge-shaped areas of peripheral hypoenhancement, image 25/31. Adrenals/Urinary Tract: There is a 1.3 cm nodule in the left adrenal gland which is indeterminate with only mild loss of signal on the out of phase sequences, image 29/9. This is a new finding when compared with the remote CT of the abdomen pelvis from 09/17/2020. Normal right adrenal gland. No kidney mass or signs of obstructive uropathy. Stomach/Bowel: Visualized portions within the abdomen are unremarkable. Vascular/Lymphatic: No aneurysm. Aortic atherosclerosis. No adenopathy. Other:  No ascites.  Musculoskeletal: Multifocal abnormal areas of enhancement are identified throughout the visualized portions of the thoracic and lumbar spine which are concerning for osseous metastasis. IMPRESSION: 1. There are multifocal bilobar, ill-defined areas of mild increased T2 signal noted within both lobes of liver which show hypoenhancement on the postcontrast images and restricted diffusion. At least 5 lesions are noted involving both lobes. 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 2. Multifocal abnormal areas of enhancement are identified throughout the visualized portions of the thoracic and lumbar spine which are concerning for osseous metastasis. 3. There are 2 wedge-shaped areas of peripheral hypoenhancement within the proximal portion of the spleen which are concerning for splenic infarcts. 4. There is a 1.3 cm nodule in the left adrenal gland which is indeterminate with only mild loss of signal on the out of phase sequences. This is a new finding when compared with the remote CT of the abdomen pelvis from 09/17/2020. Differential considerations include lipid poor adenoma versus a adrenal metastasis. 5. Small right pleural effusion. 6. Partially visualized mass within the superior segment of right lower lobe. 7. Status post cholecystectomy. Mild intrahepatic bile duct dilatation. Fusiform dilatation of the common bile duct which measures 1.3 cm proximally. No signs of choledocholithiasis or mass. Electronically Signed   By: Signa Kell M.D.   On: 11/12/2023 05:07   CT ANGIO HEAD NECK W WO CM Addendum Date: 11/11/2023 ADDENDUM REPORT: 11/11/2023 23:37 ADDENDUM: Findings discussed with Mariah Milling, NP via telephone at 10:33 p.m. Electronically Signed   By: Feliberto Harts M.D.   On: 11/11/2023 23:37   Result Date: 11/11/2023 CLINICAL DATA:  Stroke/TIA, determine embolic source EXAM: CT ANGIOGRAPHY HEAD AND NECK WITH AND WITHOUT CONTRAST  TECHNIQUE: Multidetector CT imaging of the head and neck was  performed using the standard protocol during bolus administration of intravenous contrast. Multiplanar CT image reconstructions and MIPs were obtained to evaluate the vascular anatomy. Carotid stenosis measurements (when applicable) are obtained utilizing NASCET criteria, using the distal internal carotid diameter as the denominator. RADIATION DOSE REDUCTION: This exam was performed according to the departmental dose-optimization program which includes automated exposure control, adjustment of the mA and/or kV according to patient size and/or use of iterative reconstruction technique. CONTRAST:  75mL OMNIPAQUE IOHEXOL 350 MG/ML SOLN COMPARISON:  Same day MRI head. FINDINGS: CTA NECK FINDINGS Aortic arch: Aortic atherosclerosis. 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 Great vessel origins are patent. Right carotid system: Atherosclerosis at the carotid bifurcation without greater than 50% stenosis. Left carotid system: Atherosclerosis at the carotid bifurcation without greater than 50% stenosis. Vertebral arteries: Codominant. No evidence of dissection, stenosis (50% or greater), or occlusion. Skeleton: No evidence of acute abnormality on limited assessment. Multilevel degenerative change. Other neck: Approximately 2.3 cm left thyroid nodule. Upper chest: Please see same day CT chest for intrathoracic findings. Emphysema. Review of the MIP images confirms the above findings CTA HEAD FINDINGS Anterior circulation: Bilateral intracranial ICAs are patent. Approximately 2 mm medially directed outpouching arising from the right cavernous ICA, which could represent an infundibulum or aneurysm. Bilateral M1 MCAs are patent. 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 Bilateral ACAs are patent without proximal hemodynamically significant stenosis. Posterior circulation: Bilateral intradural vertebral arteries, basilar artery and bilateral posterior cerebral arteries  are patent without proximal hemodynamically significant stenosis. Venous sinuses: As permitted by contrast timing, patent. Review of the MIP images confirms the above findings The ordering provider has been paged at the time of dictation for call of report. IMPRESSION: 1. 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 2. Approximately 2 mm medially directed outpouching arising from the right cavernous ICA, which could represent an infundibulum or aneurysm. 3. Please see same day CT chest for intrathoracic findings. 4. Approximately 2.3 cm left thyroid nodule. Recommend thyroid US (ref: J Am Coll Radiol. 2015 Feb;12(2): 143-50). 5. Aortic Atherosclerosis (ICD10-I70.0) and Emphysema (ICD10-J43.9). Electronically Signed: By: Feliberto Harts M.D. On: 11/11/2023 23:14   MR BRAIN W WO CONTRAST Result Date: 11/11/2023 CLINICAL DATA:  right facial weakness, malignancy also on the ddx EXAM: MRI HEAD WITHOUT AND WITH CONTRAST TECHNIQUE: Multiplanar, multiecho pulse sequences of the brain and surrounding structures were obtained without and with intravenous contrast. CONTRAST:  8mL GADAVIST GADOBUTROL 1 MMOL/ML IV SOLN COMPARISON:  CT head November 11, 2023. 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 Mild edema without mass effect. No midline shift. Probable additional punctate acute infarct in the inferior left cerebellum. No evidence of acute hemorrhage, mass lesion, or hydrocephalus. Vascular: Major arterial flow voids are maintained at the skull base. Skull and upper cervical spine: Normal marrow signal. Sinuses/Orbits: Clear sinuses.  No acute orbital findings. IMPRESSION: 1. 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 Probable additional punctate acute infarct in the inferior left cerebellum. These results will be called to the ordering clinician or  representative by the Radiologist Assistant, and communication documented in the PACS or [employer]. Electronically Signed   By: Feliberto Harts M.D.   On: 11/11/2023 20:27    Assessment and Plan CARNESHA MARAVILLA is  a 68 y.o. Caucasian female with medical history significant for paroxysmal atrial fibrillation, anxiety, COPD, on home O2 at 3 L/min, diastolic function, GERD, and breast cancer, not currently on chemotherapy, hypertension and dyslipidemia, who presented to the emergency room with acute onset of recurrent nausea and vomiting over the last week and a half.  She has not had any solid food for the past week.  She has been having significant anorexia secondarily.  She denied any bilious vomitus or hematemesis.    Nausea/vomiting/diarrhea Transaminitis with new liver lesions history of breast cancer -- No signs obstruction on CT. Resolved. Likely 2/2 metastatic disease.  --Transaminitis improving, likely 2/2 liver lesions. Hepatitis panel neg.  --GI consulted, has signed off. No diarrhea here so no stool testing has been performed -- patient will get biopsy from liver lesions on Monday. IR aware - oncology (Dr. Cathie Hoops) input appreciated   Acute stroke, embolic --Incidental note of right sided facial weakness yesterday.  --MRI shows multiple small acute infarcts posterior left MCA distribution, left cerebellum. Hypercoagulable state from malignancy? Cardiac embolic? LDL 131 - neurology recommends continue IV heparin drip till biopsy is over and switch back to eliquis. Oncology does not feel this is eliquis failure. Patient had hard time absorbing it due to nausea vomiting and diarrhea. - PT/OT-- recommends rehab -- patents given elevated liver enzymes    Right ICA outpouching On CTA --Dr. Katrinka Blazing of neurosurgery -- recommends outpatient follow-up     Normocytic anemia --Hgb 8.7 today from 10-11s previously, no report of bleeding, suspect at least partially dilutional     COPD/Chronic  hypoxic respiratory failure Stable, at baseline.   - breo/incruse for home trelegy - home o2, stable on home dose   GAD - home celexa    a-fib, chronic - home apixaban-- currently on IV heparin drip till liver biopsy completed on Monday - cont home dilt    chronic pain - home pregabalin, oxy   History breast cancer See above - home tamoxifen on hold   Procedures: Family communication : husband at bedside Consults : neurology, oncology CODE STATUS: DNR DVT Prophylaxis : heparin drip Level of care: Progressive Status is: Inpatient Remains inpatient appropriate because: continue IV heparin drip. Patient needs liver biopsy to be done on Monday    TOTAL TIME TAKING CARE OF THIS PATIENT: 50 minutes.  >50% time spent on counselling and coordination of care  Note: This dictation was prepared with Dragon dictation along with smaller phrase technology. Any transcriptional errors that result from this process are unintentional.  Enedina Finner M.D    [facility]   CC: Primary care physician; Barbette Reichmann, MD

## 2023-11-13 NOTE — Consult Note (Signed)
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 stenosis. Left carotid system: Atherosclerosis at the carotid bifurcation without greater than 50% stenosis. Vertebral arteries: Codominant. No evidence of dissection, stenosis (50% or greater), or occlusion. Skeleton: No evidence of acute abnormality on limited assessment. Multilevel degenerative change. Other neck: Approximately 2.3 cm left thyroid nodule. Upper chest: Please see same day CT chest for intrathoracic findings. Emphysema. Review of the MIP images confirms the above findings CTA HEAD FINDINGS Anterior circulation: Bilateral intracranial ICAs are patent. Approximately 2 mm medially directed outpouching arising from the right cavernous ICA, which could represent an infundibulum or aneurysm. Bilateral M1 MCAs are patent. 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 Bilateral ACAs are patent without proximal hemodynamically significant stenosis. Posterior  circulation: Bilateral intradural vertebral arteries, basilar artery and bilateral posterior cerebral arteries are patent without proximal hemodynamically significant stenosis. Venous sinuses: As permitted by contrast timing, patent. Review of the MIP images confirms the above findings The ordering provider has been paged at the time of dictation for call of report. IMPRESSION: 1. 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 2. Approximately 2 mm medially directed outpouching arising from the right cavernous ICA, which could represent an infundibulum or aneurysm. 3. Please see same day CT chest for intrathoracic findings. 4. Approximately 2.3 cm left thyroid nodule. Recommend thyroid US (ref: J Am Coll Radiol. 2015 Feb;12(2): 143-50). 5. Aortic Atherosclerosis (ICD10-I70.0) and Emphysema (ICD10-J43.9). Electronically Signed: By: Feliberto Harts M.D. On: 11/11/2023 23:14   MR BRAIN W WO CONTRAST Result Date: 11/11/2023 CLINICAL DATA:  right facial weakness, malignancy also on the ddx EXAM: MRI HEAD WITHOUT AND WITH CONTRAST TECHNIQUE: Multiplanar, multiecho pulse sequences of the brain and surrounding structures were obtained without and with intravenous contrast. CONTRAST:  8mL GADAVIST GADOBUTROL 1 MMOL/ML IV SOLN COMPARISON:  CT head November 11, 2023. 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 Mild edema without mass effect. No midline shift. Probable additional punctate acute infarct in the inferior left cerebellum. No evidence of acute hemorrhage, mass lesion, or hydrocephalus. Vascular: Major arterial flow voids are maintained at the skull base. Skull and upper cervical spine: Normal marrow signal. Sinuses/Orbits: Clear sinuses.  No acute orbital findings. IMPRESSION: 1. Many small acute  infarcts in the posterior left MCA distribution including the left frontal and parietal lobes 2. Probable additional  punctate acute infarct in the inferior left cerebellum. These results will be called to the ordering clinician or representative by the Radiologist Assistant, and communication documented in the PACS or [employer]. Electronically Signed   By: Feliberto Harts M.D.   On: 11/11/2023 20:27   CT CHEST W CONTRAST Result Date: 11/11/2023 CLINICAL DATA:  Liver lesions with history of breast cancer. EXAM: CT CHEST WITH CONTRAST TECHNIQUE: Multidetector CT imaging of the chest was performed during intravenous contrast administration. RADIATION DOSE REDUCTION: This exam was performed according to the departmental dose-optimization program which includes automated exposure control, adjustment of the mA and/or kV according to patient size and/or use of iterative reconstruction technique. CONTRAST:  75mL OMNIPAQUE IOHEXOL 300 MG/ML  SOLN COMPARISON:  CT abdomen and pelvis 11/10/2023. FINDINGS: Cardiovascular: Heart is borderline enlarged/mildly enlarged. Aorta is normal in size. There is no pericardial effusion. There are atherosclerotic calcifications of the aorta. Mediastinum/Nodes: There is a heterogeneous left thyroid nodule attaining cystic and solid components measuring 2.6 x 1.5 cm. There are multiple enlarged right paratracheal lymph nodes measuring up to 2.0 x 2.9 cm. Enlarged subcarinal lymph node measures 1.2 cm short axis. There is a prominent right lower esophageal lymph node measuring 8 mm short axis. There are multiple enlarged right hilar lymph nodes measuring up to 1.4 x 1.8 cm. Visualized esophagus is within normal limits. Lungs/Pleura: Mild emphysematous changes are present. There is scarring in both lung apices. Multifocal airspace disease is seen throughout the right lower lobe, predominantly posteriorly and peripherally. Some areas are slightly nodular for example superior segment measuring 3.2 by 2.0 cm. A small air-fluid level seen in this region. No other air-fluid levels are seen. Additional  nodular density seen in the right lower lobe image 5/92 measuring 9 mm. Smaller nodules are seen scattered throughout the right lower lobe measuring 5 mm or less. There is a trace right pleural effusion. There is a right middle lobe nodule measuring 5 mm image 5/122. There are prominent intrapulmonary lymph nodes along the right major fissure. There are 2 mm nodular densities in the left upper lobe. Left lung is otherwise clear. Upper Abdomen: Subcentimeter hypodensities in the liver and left adrenal mass are unchanged. Please see CT abdomen and pelvis performed same day for further description. Musculoskeletal: There is anterior intramuscular chest wall edema on the right. Focal density is seen in the medial right breast extending to the skin surface measuring 1.2 x 2.3 cm image 3/81. There some associated diffuse right breast skin thickening. No acute fracture. There is a single 5 mm sclerotic density in the T3 vertebral body. IMPRESSION: 1. Multifocal airspace disease throughout the right lower lobe, predominantly posteriorly and peripherally. Single area is slightly nodular in the superior segment containing an air-fluid level worrisome for necrotic pneumonia or necrotic nodule. Findings may be infectious/inflammatory, but neoplasm not excluded. 2. Trace right pleural effusion. 3. Mediastinal and right hilar lymphadenopathy. 4. Additional right lower lobe and right middle lobe pulmonary nodules measuring 5 mm. 5. Left thyroid nodule measuring 2.6 cm. Recommend further evaluation with ultrasound. 6. Focal density in the medial right breast extending to the skin surface measuring 1.2 x 2.3 cm. Correlate with physical exam. 7. Single 5 mm sclerotic density in the T3 vertebral body. Aortic Atherosclerosis (ICD10-I70.0) and Emphysema (ICD10-J43.9). Electronically Signed   By: Darliss Cheney M.D.   On: 11/11/2023 17:53   CT HEAD  WO CONTRAST ( ) Result Date: 11/11/2023 CLINICAL DATA:  Right facial weakness. EXAM:  CT HEAD WITHOUT CONTRAST TECHNIQUE: Contiguous axial images were obtained from the base of the skull through the vertex without intravenous contrast. RADIATION DOSE REDUCTION: This exam was performed according to the departmental dose-optimization program which includes automated exposure control, adjustment of the mA and/or kV according to patient size and/or use of iterative reconstruction technique. COMPARISON:  Head CT 07/21/2022 FINDINGS: Brain: There are new small hypodensities in the left frontoparietal white matter at the level of the posterior centrum semiovale (series 3, image 20), and there is a new subcentimeter focus of cortical hypodensity at the level of the left parietal operculum (series 6, image 46), suspicious for recent infarcts. No intracranial hemorrhage, mass, midline shift, or extra-axial fluid collection is identified. Cerebral volume is normal. The ventricles are normal in size. Vascular: No hyperdense vessel. Skull: No acute fracture or suspicious lesion. Sinuses/Orbits: Visualized paranasal sinuses and mastoid air cells are clear. Bilateral cataract extraction. Other: None. 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 A brain MRI is in progress and will provide further evaluation. Electronically Signed   By: Sebastian Ache M.D.   On: 11/11/2023 15:13   DG Chest 2 View Result Date: 11/10/2023 CLINICAL DATA:  dyspnea EXAM: CHEST - 2 VIEW COMPARISON:  Chest x-ray 07/21/2022, chest x-ray 02/15/2021 FINDINGS: The heart and mediastinal contours are unchanged. Prominent hilar vasculature. No focal consolidation. No pulmonary edema. No pleural effusion. No pneumothorax. No acute osseous abnormality. IMPRESSION: Prominent hilar vasculature suggestive of pulmonary venous congestion. Underlying lymph nodes not excluded. Electronically Signed   By: Tish Frederickson M.D.   On: 11/10/2023 23:50   CT ABDOMEN PELVIS W CONTRAST Result Date:  11/10/2023 CLINICAL DATA:  Acute nonlocalized abdominal pain. Vomiting and diarrhea. EXAM: CT ABDOMEN AND PELVIS WITH CONTRAST TECHNIQUE: Multidetector CT imaging of the abdomen and pelvis was performed using the standard protocol following bolus administration of intravenous contrast. RADIATION DOSE REDUCTION: This exam was performed according to the departmental dose-optimization program which includes automated exposure control, adjustment of the mA and/or kV according to patient size and/or use of iterative reconstruction technique. CONTRAST:  OMNIPAQUE IOHEXOL 300 MG/ML  SOLN COMPARISON:  None Available. FINDINGS: Lower chest: Focal consolidation suggested in the right lung base with small right pleural effusion. This may represent pneumonia or less likely aspiration. Emphysematous changes in the lungs. Hepatobiliary: Multiple scattered low-attenuation lesions in the liver, most are subcentimeter in size. Largest is in the medial segment left lobe measuring 1.3 cm in diameter. Enhancement pattern is indeterminate. Consider follow-up MRI for further characterization. The gallbladder is not visualized, likely surgically absent. Mild intrahepatic bile duct dilatation is most likely due to postoperative change. No common duct stones are identified. Pancreas: Unremarkable. No pancreatic ductal dilatation or surrounding inflammatory changes. Spleen: Segmental low-attenuation in the upper spleen likely represents a splenic infarct. Spleen size is normal. Adrenals/Urinary Tract: 1.3 cm diameter left adrenal gland nodule with density measurement of 69 Hounsfield units. Renal nephrograms are symmetrical. No solid mass identified. No hydronephrosis or hydroureter. Bladder is normal. Stomach/Bowel: Stomach is within normal limits. Appendix appears normal. No evidence of bowel wall thickening, distention, or inflammatory changes. Vascular/Lymphatic: Aortic atherosclerosis. No enlarged abdominal or pelvic lymph nodes.  Reproductive: Uterus and bilateral adnexa are unremarkable. Other: No abdominal wall hernia or abnormality. No abdominopelvic ascites. Musculoskeletal: Postoperative fixation of the right hip. No acute bony abnormalities. IMPRESSION: 1. Nonspecific low-attenuation lesions in the  liver, largest measuring 1.3 cm diameter. Enhancement pattern is indeterminate. Consider follow-up MRI for further characterization. 2. Mild intrahepatic bile duct dilatation is likely postoperative in the setting of cholecystectomy. 3. Left adrenal mass measuring 1.3 cm, probable benign adenoma. Recommend follow-up adrenal washout CT in 1 year. If stable for = 1 year, no further follow-up imaging. JACR 2017 Aug; 14(8):1038-44, JCAT 2016 Mar-Apr; 40(2):194-200, Urol J 2006 Spring; 3(2):71-4. 4. Aortic atherosclerosis. 5. Focal area of consolidation suggested in the right lung base with small right pleural effusion, likely pneumonia. Electronically Signed   By: Burman Nieves M.D.   On: 11/10/2023 23:30    Labs:  CBC: Recent Labs    11/10/23 2019 11/11/23 0041 11/12/23 0555 11/13/23 0157  WBC 11.7* 11.8* 9.4 9.3  HGB 11.2* 10.3* 8.7* 9.1*  HCT 33.0* 31.0* 25.7* 26.7*  PLT 257 251 202 231    COAGS: Recent Labs    11/12/23 1806 11/13/23 0157 11/13/23 0853  INR  --  1.6*  --   APTT 26 56* 90*    BMP: Recent Labs    11/10/23 2019 11/11/23 0041 11/12/23 0555 11/13/23 0157  NA 134* 134* 137 142  K 3.5 3.7 3.3* 3.7  CL 95* 100 103 108  CO2 25 24 26 27   GLUCOSE 97 85 96 110*  BUN 14 13 9 9   CALCIUM 9.6 8.6* 8.5* 8.7*  CREATININE 0.79 0.77 0.93 0.81  GFRNONAA >60 >60 >60 >60    LIVER FUNCTION TESTS: Recent Labs    11/10/23 2019 11/11/23 0510 11/12/23 0555 11/13/23 0157  BILITOT 1.3* 1.0 0.7 0.6  AST 112* 85* 74* 68*  ALT 171* 132* 120* 113*  ALKPHOS 59 49 46 49  PROT 7.7 5.9* 6.0* 6.2*  ALBUMIN 3.6 2.8* 2.8* 2.9*     Assessment and Plan:  Liver lesions; request for image guided liver  lesion biopsy - N.p.o. midnight prior to procedure - Orders for holding heparin prior to procedure placed - Procedure discussed and planned for 11/16/2023 by Dr. Fredia Sorrow - 3/31 labs pending - afebrile, VSS at time of exam   Risks and benefits of liver lesion biopsy was discussed with the patient and patient's family including, but not limited to bleeding, infection, damage to adjacent structures or low yield requiring additional tests.  All of the questions were answered and there is agreement to proceed.  Consent signed and in chart.   Thank you for allowing our service to participate in LYNDSI ALTIC 's care.    Electronically Signed: Carlton Adam, NP   11/13/2023, 10:19 AM     I spent a total of  30 Minutes   in face to face in clinical consultation, greater than 50% of which was counseling/coordinating care for image guided liver lesion biopsy   (A copy of this note was sent to the referring provider and the time of visit.)

## 2023-11-14 DIAGNOSIS — I6340 Cerebral infarction due to embolism of unspecified cerebral artery: Secondary | ICD-10-CM | POA: Diagnosis not present

## 2023-11-14 DIAGNOSIS — K769 Liver disease, unspecified: Secondary | ICD-10-CM | POA: Diagnosis not present

## 2023-11-14 DIAGNOSIS — I480 Paroxysmal atrial fibrillation: Secondary | ICD-10-CM | POA: Diagnosis not present

## 2023-11-14 DIAGNOSIS — C50919 Malignant neoplasm of unspecified site of unspecified female breast: Secondary | ICD-10-CM | POA: Diagnosis not present

## 2023-11-14 LAB — CBC
HCT: 24.3 % — ABNORMAL LOW (ref 36.0–46.0)
Hemoglobin: 8.2 g/dL — ABNORMAL LOW (ref 12.0–15.0)
MCH: 30.4 pg (ref 26.0–34.0)
MCHC: 33.7 g/dL (ref 30.0–36.0)
MCV: 90 fL (ref 80.0–100.0)
Platelets: 259 10*3/uL (ref 150–400)
RBC: 2.7 MIL/uL — ABNORMAL LOW (ref 3.87–5.11)
RDW: 13.7 % (ref 11.5–15.5)
WBC: 10.4 10*3/uL (ref 4.0–10.5)
nRBC: 0 % (ref 0.0–0.2)

## 2023-11-14 LAB — HEPARIN LEVEL (UNFRACTIONATED): Heparin Unfractionated: >1.1 [IU]/mL — ABNORMAL HIGH (ref 0.30–0.70)

## 2023-11-14 LAB — CANCER ANTIGEN 27.29: CA 27.29: 691.4 U/mL — ABNORMAL HIGH (ref 0.0–38.6)

## 2023-11-14 LAB — CANCER ANTIGEN 15-3: CA 15-3: 215 U/mL — ABNORMAL HIGH (ref 0.0–25.0)

## 2023-11-14 LAB — APTT: aPTT: 93 s — ABNORMAL HIGH (ref 24–36)

## 2023-11-14 MED ORDER — SENNOSIDES-DOCUSATE SODIUM 8.6-50 MG PO TABS
1.0000 | ORAL_TABLET | Freq: Two times a day (BID) | ORAL | Status: DC
  Administered 2023-11-14 – 2023-11-15 (×3): 1 via ORAL
  Filled 2023-11-14 (×5): qty 1

## 2023-11-14 NOTE — Progress Notes (Signed)
 Mobility Specialist - Progress Note   11/14/23 0923  Mobility  Activity Ambulated with assistance in hallway;Stood at bedside;Dangled on edge of bed  Level of Assistance Contact guard assist, steadying assist  Assistive Device None  Distance Ambulated (ft) 100 ft  Activity Response Tolerated well  Mobility Referral Yes  Mobility visit 1 Mobility  Mobility Specialist Start Time (ACUTE ONLY) 0841  Mobility Specialist Stop Time (ACUTE ONLY) 0903  Mobility Specialist Time Calculation (min) (ACUTE ONLY) 22 min   Pt supine in bed on 2L upon arrival. Pt completes bed mobility indep. Pt STS and ambulates in hallway CGA with no LOB noted. Pt defers RW usage but does use rails throughout ambulation. Pt left in recliner with needs in reach and husband present.   Terrilyn Saver  Mobility Specialist  11/14/23 9:24 AM

## 2023-11-14 NOTE — Progress Notes (Signed)
 PHARMACY - ANTICOAGULATION CONSULT NOTE  Pharmacy Consult for heparin drip Indication: atrial fibrillation   (on apixaban, last dose 3/27 @ 0807)  Allergies  Allergen Reactions   Cephalexin Hives   Strawberry Extract Hives    Patient Measurements: Height: 5\' 4"  (162.6 cm) Weight: 81.6 kg (180 lb) IBW/kg (Calculated) : 54.7 HEPARIN DW (KG): 72.4  Vital Signs: Temp: 98.5 F (36.9 C) (03/29 0359) BP: 119/61 (03/29 0359) Pulse Rate: 77 (03/29 0359)  Labs: Recent Labs    11/12/23 0555 11/12/23 0555 11/12/23 1806 11/13/23 0157 11/13/23 0853 11/13/23 1422 11/14/23 0430  HGB 8.7*  --   --  9.1*  --   --  8.2*  HCT 25.7*  --   --  26.7*  --   --  24.3*  PLT 202  --   --  231  --   --  259  APTT  --    < > 26 56* 90* 87* 93*  LABPROT  --   --   --  19.2*  --   --   --   INR  --   --   --  1.6*  --   --   --   HEPARINUNFRC  --   --  >1.10*  --  >1.10*  --  >1.10*  CREATININE 0.93  --   --  0.81  --   --   --    < > = values in this interval not displayed.    Estimated Creatinine Clearance: 67.8 mL/min (by C-G formula based on SCr of 0.81 mg/dL).   Medical History: Past Medical History:  Diagnosis Date   Anxiety    Aortic atherosclerosis (HCC)    Arthritis    Atrial fibrillation (HCC) 08/01/2020   a.) CHA2DS2-VASc = 4 (age, sex, HTN, aortic plaque). b.) chronically anticoagulated using apixaban   Breast cancer, right breast (HCC) 01/08/2021   a.) Stage IB (cT2cN0cM0) invasive mammary carcinoma of the RIGHT breast; grade I, ER/PR (+) and HER2/neu (+). Tx with neoadjuvant TCHP chemotherapy.   Carotid bruit    L --nl doppler 5/09- and again 5/13 with 0-39% stenosis bilat   Constipation    COPD (chronic obstructive pulmonary disease) (HCC)    Diastolic dysfunction 08/02/2020   a.) TTE 08/02/2020: EF 60-65%; G1DD; triv MR/AR. b.) TTE 02/05/2021: ED 55-60%; G1DD; GLS -19.0%. c.) TTE 05/07/2021: TTE 55-60%; GLS -20.3%.   Family history of brain cancer    Family history of  breast cancer    Family history of kidney cancer    Family history of lung cancer    Fatigue    Fracture of femoral neck, right (HCC) 2015   GERD (gastroesophageal reflux disease)    GI (gastrointestinal bleed)    [name]   Hepatitis    Hyperlipidemia    Hypertension    Left arm pain    Leg pain    Chronic pain R leg from injury   Long term current use of anticoagulant    a.) apixaban   OSA and COPD overlap syndrome (HCC)    a.) no nocurnal PAP therapy; does utilize supplemental oxygen.   Osteopenia    Other organic sleep disorders    Supplemental oxygen dependent    Tobacco abuse     Medications:  Medications Prior to Admission  Medication Sig Dispense Refill Last Dose/Taking   acetaminophen (TYLENOL) 500 MG tablet Take 1,500 mg by mouth 2 (two) times daily as needed for moderate pain.  acidophilus (RISAQUAD) CAPS capsule Take 1 capsule by mouth daily. 10 capsule 0    albuterol (PROVENTIL) (2.5 MG/3ML) 0.083% nebulizer solution Take 2.5 mg by nebulization every 4 (four) hours as needed for wheezing or shortness of breath.       albuterol (VENTOLIN HFA) 108 (90 Base) MCG/ACT inhaler Inhale 2 puffs into the lungs every 6 (six) hours as needed for shortness of breath or wheezing.      alendronate (FOSAMAX) 70 MG tablet Take 70 mg by mouth every Sunday.      apixaban (ELIQUIS) 5 MG TABS tablet Take 1 tablet (5 mg total) by mouth 2 (two) times daily. 180 tablet 1    citalopram (CELEXA) 10 MG tablet Take 10 mg by mouth daily.      Cyanocobalamin (B-12) 1000 MCG CAPS Take 1,000 mcg by mouth daily.      diltiazem (CARDIZEM CD) 120 MG 24 hr capsule Take 1 capsule (120 mg total) by mouth daily. 90 capsule 3    GAVILAX 17 GM/SCOOP powder SMARTSIG:17 Gram(s) By Mouth Daily PRN      lidocaine-prilocaine (EMLA) cream APPLY 1 APPLICATION TOPICALLY AS NEEDED. 30 g 6    Melatonin 5 MG CAPS Take 15 mg by mouth at bedtime.      naloxone (NARCAN) nasal spray 4 mg/0.1 mL Place 1 spray into the  nose as needed (opioid overdose). (Patient not taking: Reported on 06/24/2023)      ondansetron (ZOFRAN) 8 MG tablet Take 1 tablet (8 mg total) by mouth every 8 (eight) hours as needed for nausea (Nausea or vomiting). Start on the third day after chemotherapy. 90 tablet 0    oxyCODONE (OXY IR/ROXICODONE) 5 MG immediate release tablet Take 5 mg by mouth every 6 (six) hours as needed.      pregabalin (LYRICA) 150 MG capsule Take 1 capsule (150 mg total) by mouth 2 (two) times daily. 90 capsule 0    roflumilast (DALIRESP) 500 MCG TABS tablet Take 1 tablet by mouth daily.      tamoxifen (NOLVADEX) 20 MG tablet Take 1 tablet (20 mg total) by mouth daily. 90 tablet 1    tiotropium (SPIRIVA) 18 MCG inhalation capsule Place 18 mcg into inhaler and inhale daily.      TRELEGY ELLIPTA 100-62.5-25 MCG/INH AEPB Inhale 1 puff into the lungs daily.      Scheduled:   acidophilus  1 capsule Oral Daily   atorvastatin  80 mg Oral Daily   citalopram  10 mg Oral Daily   cyanocobalamin  1,000 mcg Oral Daily   diltiazem  120 mg Oral Daily   feeding supplement  237 mL Oral TID BM   fluticasone furoate-vilanterol  1 puff Inhalation Daily   And   umeclidinium bromide  1 puff Inhalation Daily   guaiFENesin  600 mg Oral BID   pantoprazole  40 mg Oral Daily   pregabalin  150 mg Oral BID   roflumilast  500 mcg Oral Daily   sodium chloride flush  10-40 mL Intracatheter Q12H   sodium chloride flush  3-10 mL Intravenous Q12H   Infusions:   heparin 1,250 Units/hr (11/13/23 1514)    Assessment: 68 yo F on apixaban for Afib now being transitioned to heparin drip (anticipate liver lesion biopsy per Oncology note).  Last dose of apixaban 5 mg given 3/27 @ 0807.   3/28 0157 aPTT 56 3/28 0853 aPTT 90 Heparin level > 1.1 3/28 1422 aPTT 87, therapeutic x 2 3/29 0430 aPTT 93, therapeutic  X 3, HL > 1.10  Goal of Therapy:  Heparin level 0.3-0.7 units/ml once aPTT and heparin level correlated.  aPTT 66-102 seconds Monitor  platelets by anticoagulation protocol: Yes   Plan:  aPTT is therapeutic x 3 , not correlating with HL Will continue heparin infusion at 1250 units/hr.  Avoid bolus due to CVA.  Recheck aPTT and HL on 3/20 with AM labs  Daily Heparin level and CBC while on heparin.   Scherrie Gerlach, PharmD Clinical Pharmacist 11/14/2023

## 2023-11-14 NOTE — Progress Notes (Signed)
 [facility]  - [place] at [facility]   PATIENT NAME: Debra [name]    MR#:  [id number]  DATE OF BIRTH:  Feb 20, 1956  SUBJECTIVE:  seen earlier in the day. Husband at bedside. Patient ambulated about 100 ft with MT Currently on IV heparin drip for anticipated liver biopsy on Monday    VITALS:  Blood pressure (!) 103/49, pulse 75, temperature 98.5 F (36.9 C), resp. rate 20, height 5\' 4"  (1.626 m), weight 81.6 kg, SpO2 100%.  PHYSICAL EXAMINATION:   GENERAL:  68 y.o.-year-old patient with no acute distress. Obese LUNGS: Normal breath sounds bilaterally, no wheezing CARDIOVASCULAR: S1, S2 normal. No murmur   ABDOMEN: Soft, nontender, nondistended.   EXTREMITIES: No  edema b/l.    NEUROLOGIC: nonfocal  patient is alert and awake, mild facial drool and mild   LABORATORY PANEL:  CBC Recent Labs  Lab 11/14/23 0430  WBC 10.4  HGB 8.2*  HCT 24.3*  PLT 259    Chemistries  Recent Labs  Lab 11/13/23 0157  NA 142  K 3.7  CL 108  CO2 27  GLUCOSE 110*  BUN 9  CREATININE 0.81  CALCIUM 8.7*  AST 68*  ALT 113*  ALKPHOS 49  BILITOT 0.6   Cardiac Enzymes No results for input(s): "TROPONINI" in the last 168 hours. RADIOLOGY:  No results found.   Assessment and Plan Debra [name] is a 68 y.o. Caucasian female with medical history significant for paroxysmal atrial fibrillation, anxiety, COPD, on home O2 at 3 L/min, diastolic function, GERD, and breast cancer, not currently on chemotherapy, hypertension and dyslipidemia, who presented to the emergency room with acute onset of recurrent nausea and vomiting over the last week and a half.  She has not had any solid food for the past week.  She has been having significant anorexia secondarily.  She denied any bilious vomitus or hematemesis.    Nausea/vomiting/diarrhea Transaminitis with new liver lesions history of breast cancer -- No signs obstruction on CT. Resolved. Likely 2/2 metastatic disease.  --Transaminitis  improving, likely 2/2 liver lesions. Hepatitis panel neg.  --GI consulted, has signed off. No diarrhea here so no stool testing has been performed -- patient will get biopsy from liver lesions on Monday. IR aware - oncology (Dr. Cathie Hoops) input appreciated   Acute stroke, embolic --Incidental note of right sided facial weakness yesterday.  --MRI shows multiple small acute infarcts posterior left MCA distribution, left cerebellum. Hypercoagulable state from malignancy? Cardiac embolic? LDL 131 - neurology recommends continue IV heparin drip till biopsy is over and switch back to eliquis. Oncology does not feel this is eliquis failure. Patient had hard time absorbing it due to nausea vomiting and diarrhea. - PT/OT-- recommends rehab -- patents given elevated liver enzymes    Right ICA outpouching On CTA --Dr. Katrinka Blazing of neurosurgery -- recommends outpatient follow-up     Normocytic anemia --Hgb 8.7 today from 10-11s previously, no report of bleeding, suspect at least partially dilutional     COPD/Chronic hypoxic respiratory failure/chronic home oxygen Stable, at baseline.   - breo/incruse for home trelegy - home o2, stable on home dose   GAD - home celexa    a-fib, chronic - home apixaban-- currently on IV heparin drip till liver biopsy completed on Monday - cont home dilt    chronic pain - home pregabalin, oxy   History breast cancer See above - home tamoxifen on hold   Procedures: Family communication : husband at bedside Consults : neurology, oncology  CODE STATUS: DNR DVT Prophylaxis : heparin drip Level of care: Progressive Status is: Inpatient Remains inpatient appropriate because: continue IV heparin drip. Patient needs liver biopsy to be done on Monday    TOTAL TIME TAKING CARE OF THIS PATIENT: 40 minutes.  >50% time spent on counselling and coordination of care  Note: This dictation was prepared with Dragon dictation along with smaller phrase technology. Any  transcriptional errors that result from this process are unintentional.  Enedina Finner M.D    [facility]   CC: Primary care physician; Barbette Reichmann, MD

## 2023-11-15 DIAGNOSIS — I6340 Cerebral infarction due to embolism of unspecified cerebral artery: Secondary | ICD-10-CM | POA: Diagnosis not present

## 2023-11-15 DIAGNOSIS — I480 Paroxysmal atrial fibrillation: Secondary | ICD-10-CM | POA: Diagnosis not present

## 2023-11-15 DIAGNOSIS — C50919 Malignant neoplasm of unspecified site of unspecified female breast: Secondary | ICD-10-CM | POA: Diagnosis not present

## 2023-11-15 DIAGNOSIS — K769 Liver disease, unspecified: Secondary | ICD-10-CM | POA: Diagnosis not present

## 2023-11-15 LAB — CBC
HCT: 24.3 % — ABNORMAL LOW (ref 36.0–46.0)
Hemoglobin: 8.1 g/dL — ABNORMAL LOW (ref 12.0–15.0)
MCH: 29.8 pg (ref 26.0–34.0)
MCHC: 33.3 g/dL (ref 30.0–36.0)
MCV: 89.3 fL (ref 80.0–100.0)
Platelets: 266 10*3/uL (ref 150–400)
RBC: 2.72 MIL/uL — ABNORMAL LOW (ref 3.87–5.11)
RDW: 13.8 % (ref 11.5–15.5)
WBC: 11 10*3/uL — ABNORMAL HIGH (ref 4.0–10.5)
nRBC: 0 % (ref 0.0–0.2)

## 2023-11-15 LAB — APTT: aPTT: 78 s — ABNORMAL HIGH (ref 24–36)

## 2023-11-15 LAB — HEPARIN LEVEL (UNFRACTIONATED): Heparin Unfractionated: 0.93 [IU]/mL — ABNORMAL HIGH (ref 0.30–0.70)

## 2023-11-15 NOTE — Progress Notes (Signed)
 PHARMACY - ANTICOAGULATION CONSULT NOTE  Pharmacy Consult for heparin drip Indication: atrial fibrillation   (on apixaban, last dose 3/27 @ 0807)  Allergies  Allergen Reactions   Cephalexin Hives   Strawberry Extract Hives    Patient Measurements: Height: 5\' 4"  (162.6 cm) Weight: 81.6 kg (180 lb) IBW/kg (Calculated) : 54.7 HEPARIN DW (KG): 72.4  Vital Signs: Temp: 98.4 F (36.9 C) (03/30 0614) Temp Source: Oral (03/30 0614) BP: 98/56 (03/30 0981) Pulse Rate: 91 (03/30 0614)  Labs: Recent Labs    11/13/23 0157 11/13/23 0853 11/13/23 1422 11/14/23 0430 11/15/23 0541  HGB 9.1*  --   --  8.2* 8.1*  HCT 26.7*  --   --  24.3* 24.3*  PLT 231  --   --  259 266  APTT 56* 90* 87* 93* 78*  LABPROT 19.2*  --   --   --   --   INR 1.6*  --   --   --   --   HEPARINUNFRC  --  >1.10*  --  >1.10* 0.93*  CREATININE 0.81  --   --   --   --     Estimated Creatinine Clearance: 67.8 mL/min (by C-G formula based on SCr of 0.81 mg/dL).   Medical History: Past Medical History:  Diagnosis Date   Anxiety    Aortic atherosclerosis (HCC)    Arthritis    Atrial fibrillation (HCC) 08/01/2020   a.) CHA2DS2-VASc = 4 (age, sex, HTN, aortic plaque). b.) chronically anticoagulated using apixaban   Breast cancer, right breast (HCC) 01/08/2021   a.) Stage IB (cT2cN0cM0) invasive mammary carcinoma of the RIGHT breast; grade I, ER/PR (+) and HER2/neu (+). Tx with neoadjuvant TCHP chemotherapy.   Carotid bruit    L --nl doppler 5/09- and again 5/13 with 0-39% stenosis bilat   Constipation    COPD (chronic obstructive pulmonary disease) (HCC)    Diastolic dysfunction 08/02/2020   a.) TTE 08/02/2020: EF 60-65%; G1DD; triv MR/AR. b.) TTE 02/05/2021: ED 55-60%; G1DD; GLS -19.0%. c.) TTE 05/07/2021: TTE 55-60%; GLS -20.3%.   Family history of brain cancer    Family history of breast cancer    Family history of kidney cancer    Family history of lung cancer    Fatigue    Fracture of femoral neck,  right (HCC) 2015   GERD (gastroesophageal reflux disease)    GI (gastrointestinal bleed)    [name]   Hepatitis    Hyperlipidemia    Hypertension    Left arm pain    Leg pain    Chronic pain R leg from injury   Long term current use of anticoagulant    a.) apixaban   OSA and COPD overlap syndrome (HCC)    a.) no nocurnal PAP therapy; does utilize supplemental oxygen.   Osteopenia    Other organic sleep disorders    Supplemental oxygen dependent    Tobacco abuse     Medications:  Medications Prior to Admission  Medication Sig Dispense Refill Last Dose/Taking   acetaminophen (TYLENOL) 500 MG tablet Take 1,500 mg by mouth 2 (two) times daily as needed for moderate pain.      acidophilus (RISAQUAD) CAPS capsule Take 1 capsule by mouth daily. 10 capsule 0    albuterol (PROVENTIL) (2.5 MG/3ML) 0.083% nebulizer solution Take 2.5 mg by nebulization every 4 (four) hours as needed for wheezing or shortness of breath.       albuterol (VENTOLIN HFA) 108 (90 Base) MCG/ACT inhaler Inhale 2  puffs into the lungs every 6 (six) hours as needed for shortness of breath or wheezing.      alendronate (FOSAMAX) 70 MG tablet Take 70 mg by mouth every Sunday.      apixaban (ELIQUIS) 5 MG TABS tablet Take 1 tablet (5 mg total) by mouth 2 (two) times daily. 180 tablet 1    citalopram (CELEXA) 10 MG tablet Take 10 mg by mouth daily.      Cyanocobalamin (B-12) 1000 MCG CAPS Take 1,000 mcg by mouth daily.      diltiazem (CARDIZEM CD) 120 MG 24 hr capsule Take 1 capsule (120 mg total) by mouth daily. 90 capsule 3    GAVILAX 17 GM/SCOOP powder SMARTSIG:17 Gram(s) By Mouth Daily PRN      lidocaine-prilocaine (EMLA) cream APPLY 1 APPLICATION TOPICALLY AS NEEDED. 30 g 6    Melatonin 5 MG CAPS Take 15 mg by mouth at bedtime.      naloxone (NARCAN) nasal spray 4 mg/0.1 mL Place 1 spray into the nose as needed (opioid overdose). (Patient not taking: Reported on 06/24/2023)      ondansetron (ZOFRAN) 8 MG tablet Take 1  tablet (8 mg total) by mouth every 8 (eight) hours as needed for nausea (Nausea or vomiting). Start on the third day after chemotherapy. 90 tablet 0    oxyCODONE (OXY IR/ROXICODONE) 5 MG immediate release tablet Take 5 mg by mouth every 6 (six) hours as needed.      pregabalin (LYRICA) 150 MG capsule Take 1 capsule (150 mg total) by mouth 2 (two) times daily. 90 capsule 0    roflumilast (DALIRESP) 500 MCG TABS tablet Take 1 tablet by mouth daily.      tamoxifen (NOLVADEX) 20 MG tablet Take 1 tablet (20 mg total) by mouth daily. 90 tablet 1    tiotropium (SPIRIVA) 18 MCG inhalation capsule Place 18 mcg into inhaler and inhale daily.      TRELEGY ELLIPTA 100-62.5-25 MCG/INH AEPB Inhale 1 puff into the lungs daily.      Scheduled:   acidophilus  1 capsule Oral Daily   atorvastatin  80 mg Oral Daily   citalopram  10 mg Oral Daily   cyanocobalamin  1,000 mcg Oral Daily   diltiazem  120 mg Oral Daily   feeding supplement  237 mL Oral TID BM   fluticasone furoate-vilanterol  1 puff Inhalation Daily   And   umeclidinium bromide  1 puff Inhalation Daily   guaiFENesin  600 mg Oral BID   pantoprazole  40 mg Oral Daily   pregabalin  150 mg Oral BID   roflumilast  500 mcg Oral Daily   senna-docusate  1 tablet Oral BID   sodium chloride flush  10-40 mL Intracatheter Q12H   sodium chloride flush  3-10 mL Intravenous Q12H   Infusions:   heparin 1,250 Units/hr (11/14/23 1123)    Assessment: 68 yo F on apixaban for Afib now being transitioned to heparin drip (anticipate liver lesion biopsy per Oncology note).  Last dose of apixaban 5 mg given 3/27 @ 0807.   3/28 0157 aPTT 56 3/28 0853 aPTT 90 Heparin level > 1.1 3/28 1422 aPTT 87, therapeutic x 2 3/29 0430 aPTT 93, therapeutic X 3, HL > 1.10 3/30 0541 aPTT 78, therapeutic X 4, HL = 0.93  Goal of Therapy:  Heparin level 0.3-0.7 units/ml once aPTT and heparin level correlated.  aPTT 66-102 seconds Monitor platelets by anticoagulation protocol:  Yes   Plan:  aPTT is therapeutic  x 4 , not correlating with HL Will continue heparin infusion at 1250 units/hr.  Avoid bolus due to CVA.  Recheck aPTT and HL on 3/31 with AM labs  Daily Heparin level and CBC while on heparin.   Scherrie Gerlach, PharmD Clinical Pharmacist 11/15/2023

## 2023-11-15 NOTE — Plan of Care (Signed)
  Problem: Education: Goal: Knowledge of General Education information will improve Description: Including pain rating scale, medication(s)/side effects and non-pharmacologic comfort measures Outcome: Progressing   Problem: Health Behavior/Discharge Planning: Goal: Ability to manage health-related needs will improve Outcome: Progressing   Problem: Clinical Measurements: Goal: Ability to maintain clinical measurements within normal limits will improve Outcome: Progressing Goal: Will remain free from infection Outcome: Progressing Goal: Diagnostic test results will improve Outcome: Progressing Goal: Respiratory complications will improve Outcome: Progressing Goal: Cardiovascular complication will be avoided Outcome: Progressing   Problem: Activity: Goal: Risk for activity intolerance will decrease Outcome: Progressing   Problem: Nutrition: Goal: Adequate nutrition will be maintained Outcome: Progressing   Problem: Coping: Goal: Level of anxiety will decrease Outcome: Progressing   Problem: Elimination: Goal: Will not experience complications related to bowel motility Outcome: Progressing Goal: Will not experience complications related to urinary retention Outcome: Progressing   Problem: Pain Managment: Goal: General experience of comfort will improve and/or be controlled Outcome: Progressing   Problem: Safety: Goal: Ability to remain free from injury will improve Outcome: Progressing   Problem: Skin Integrity: Goal: Risk for impaired skin integrity will decrease Outcome: Progressing   Problem: Activity: Goal: Ability to tolerate increased activity will improve Outcome: Progressing   Problem: Clinical Measurements: Goal: Ability to maintain a body temperature in the normal range will improve Outcome: Progressing   Problem: Respiratory: Goal: Ability to maintain adequate ventilation will improve Outcome: Progressing Goal: Ability to maintain a clear airway  will improve Outcome: Progressing   Problem: Education: Goal: Knowledge of disease or condition will improve Outcome: Progressing Goal: Knowledge of secondary prevention will improve (MUST DOCUMENT ALL) Outcome: Progressing Goal: Knowledge of patient specific risk factors will improve (DELETE if not current risk factor) Outcome: Progressing   Problem: Ischemic Stroke/TIA Tissue Perfusion: Goal: Complications of ischemic stroke/TIA will be minimized Outcome: Progressing   Problem: Coping: Goal: Will verbalize positive feelings about self Outcome: Progressing Goal: Will identify appropriate support needs Outcome: Progressing   Problem: Health Behavior/Discharge Planning: Goal: Ability to manage health-related needs will improve Outcome: Progressing Goal: Goals will be collaboratively established with patient/family Outcome: Progressing   Problem: Self-Care: Goal: Ability to participate in self-care as condition permits will improve Outcome: Progressing Goal: Verbalization of feelings and concerns over difficulty with self-care will improve Outcome: Progressing Goal: Ability to communicate needs accurately will improve Outcome: Progressing   Problem: Nutrition: Goal: Risk of aspiration will decrease Outcome: Progressing Goal: Dietary intake will improve Outcome: Progressing

## 2023-11-15 NOTE — Progress Notes (Signed)
 [facility]  - [place] at [facility]   PATIENT NAME: Debra [name]    MR#:  [id number]  DATE OF BIRTH:  1956-02-18  SUBJECTIVE:  seen earlier in the day.  No new complaints Currently on IV heparin drip for anticipated liver biopsy on Monday    VITALS:  Blood pressure 111/60, pulse 94, temperature 98.9 F (37.2 C), resp. rate 18, height 5\' 4"  (1.626 m), weight 81.6 kg, SpO2 96%.  PHYSICAL EXAMINATION:   GENERAL:  68 y.o.-year-old patient with no acute distress. Obese LUNGS: Normal breath sounds bilaterally, no wheezing CARDIOVASCULAR: S1, S2 normal. No murmur   ABDOMEN: Soft, nontender, nondistended.   EXTREMITIES: No  edema b/l.    NEUROLOGIC: nonfocal  patient is alert and awake, mild facial drool and mild   LABORATORY PANEL:  CBC Recent Labs  Lab 11/15/23 0541  WBC 11.0*  HGB 8.1*  HCT 24.3*  PLT 266    Chemistries  Recent Labs  Lab 11/13/23 0157  NA 142  K 3.7  CL 108  CO2 27  GLUCOSE 110*  BUN 9  CREATININE 0.81  CALCIUM 8.7*  AST 68*  ALT 113*  ALKPHOS 49  BILITOT 0.6   Cardiac Enzymes No results for input(s): "TROPONINI" in the last 168 hours. RADIOLOGY:  No results found.   Assessment and Plan Debra [name] is a 68 y.o. Caucasian female with medical history significant for paroxysmal atrial fibrillation, anxiety, COPD, on home O2 at 3 L/min, diastolic function, GERD, and breast cancer, not currently on chemotherapy, hypertension and dyslipidemia, who presented to the emergency room with acute onset of recurrent nausea and vomiting over the last week and a half.  She has not had any solid food for the past week.  She has been having significant anorexia secondarily.  She denied any bilious vomitus or hematemesis.    Nausea/vomiting/diarrhea Transaminitis with new liver lesions history of breast cancer -- No signs obstruction on CT. Resolved. Likely 2/2 metastatic disease.  --Transaminitis improving, likely 2/2 liver lesions.  Hepatitis panel neg.  --GI consulted, has signed off. No diarrhea here so no stool testing has been performed -- patient will get biopsy from liver lesions on Monday. IR aware - oncology (Dr. Cathie Hoops) input appreciated   Acute stroke, embolic --Incidental note of right sided facial weakness yesterday.  --MRI shows multiple small acute infarcts posterior left MCA distribution, left cerebellum. Hypercoagulable state from malignancy? Cardiac embolic? LDL 131 - neurology recommends continue IV heparin drip till biopsy is over and switch back to eliquis. Oncology does not feel this is eliquis failure. Patient had hard time absorbing it due to nausea vomiting and diarrhea. - PT/OT-- recommends rehab -- patents given elevated liver enzymes    Right ICA outpouching On CTA --Dr. Katrinka Blazing of neurosurgery -- recommends outpatient follow-up     Normocytic anemia --Hgb 8.7 today from 10-11s previously, no report of bleeding, suspect at least partially dilutional     COPD/Chronic hypoxic respiratory failure/chronic home oxygen Stable, at baseline.   --breo/incruse for home trelegy --home o2, stable on home dose   GAD --home celexa    a-fib, chronic --home apixaban-- currently on IV heparin drip till liver biopsy completed on Monday --cont home dilt    chronic pain --home pregabalin, oxy   History breast cancer See above - home tamoxifen on hold   Procedures: Family communication : husband at bedside Consults : neurology, oncology CODE STATUS: DNR DVT Prophylaxis : heparin drip Level of care: Progressive Status  is: Inpatient Remains inpatient appropriate because: continue IV heparin drip. Patient will have liver biopsy done on Monday    TOTAL TIME TAKING CARE OF THIS PATIENT: 35 minutes.  >50% time spent on counselling and coordination of care  Note: This dictation was prepared with Dragon dictation along with smaller phrase technology. Any transcriptional errors that result from this  process are unintentional.  Enedina Finner M.D    [facility]   CC: Primary care physician; Barbette Reichmann, MD

## 2023-11-15 NOTE — Plan of Care (Signed)

## 2023-11-15 NOTE — TOC Progression Note (Signed)
 Transition of Care [facility]) - Progression Note    Patient Details  Name: Debra [name] MRN: [id number] Date of Birth: 11/30/1955  Transition of [facility] CM/SW Contact  Bing Quarry, RN Phone Number: 11/15/2023, 2:47 PM  Clinical Narrative: 3/30: Spoke with patient regarding home health recommendations from PT evaluation. Patient has a liver biopsy pending Monday and wants to wait till speaks with cancer provider after biopsy regarding next steps. TOC to follow up when more medically ready.    Gabriel Cirri MSN RN CM  RN Case Manager [place]  Transitions of Care Direct Dial: [phone] (Weekends Only) [facility] Main Office Phone: [phone] [facility] Fax: [phone] [email or web address]            Expected Discharge Plan and Services                                               Social Determinants of Health (SDOH) Interventions SDOH Screenings   Food Insecurity: No Food Insecurity (11/11/2023)  Housing: Unknown (11/11/2023)  Transportation Needs: No Transportation Needs (11/11/2023)  Utilities: Not At Risk (11/11/2023)  Financial Resource Strain: Low Risk  (06/15/2023)   Received from [facility]  Physical Activity: Unknown (12/17/2017)  Social Connections: Moderately Isolated (11/11/2023)  Stress: No Stress Concern Present (12/17/2017)  Tobacco Use: Medium Risk (11/10/2023)    Readmission Risk Interventions     No data to display

## 2023-11-16 ENCOUNTER — Inpatient Hospital Stay

## 2023-11-16 DIAGNOSIS — I480 Paroxysmal atrial fibrillation: Secondary | ICD-10-CM | POA: Diagnosis not present

## 2023-11-16 DIAGNOSIS — I639 Cerebral infarction, unspecified: Secondary | ICD-10-CM | POA: Diagnosis not present

## 2023-11-16 DIAGNOSIS — K769 Liver disease, unspecified: Secondary | ICD-10-CM | POA: Diagnosis not present

## 2023-11-16 DIAGNOSIS — C50919 Malignant neoplasm of unspecified site of unspecified female breast: Secondary | ICD-10-CM | POA: Diagnosis not present

## 2023-11-16 DIAGNOSIS — I6340 Cerebral infarction due to embolism of unspecified cerebral artery: Secondary | ICD-10-CM | POA: Diagnosis not present

## 2023-11-16 DIAGNOSIS — R112 Nausea with vomiting, unspecified: Secondary | ICD-10-CM | POA: Diagnosis not present

## 2023-11-16 LAB — CBC
HCT: 23.2 % — ABNORMAL LOW (ref 36.0–46.0)
Hemoglobin: 7.9 g/dL — ABNORMAL LOW (ref 12.0–15.0)
MCH: 30.5 pg (ref 26.0–34.0)
MCHC: 34.1 g/dL (ref 30.0–36.0)
MCV: 89.6 fL (ref 80.0–100.0)
Platelets: 246 10*3/uL (ref 150–400)
RBC: 2.59 MIL/uL — ABNORMAL LOW (ref 3.87–5.11)
RDW: 14.1 % (ref 11.5–15.5)
WBC: 11 10*3/uL — ABNORMAL HIGH (ref 4.0–10.5)
nRBC: 0.2 % (ref 0.0–0.2)

## 2023-11-16 LAB — APTT
aPTT: 65 s — ABNORMAL HIGH (ref 24–36)
aPTT: 33 s (ref 24–36)

## 2023-11-16 LAB — CULTURE, BLOOD (ROUTINE X 2)
Culture: NO GROWTH
Culture: NO GROWTH
Special Requests: ADEQUATE
Special Requests: ADEQUATE

## 2023-11-16 LAB — PROTIME-INR
INR: 1.1 (ref 0.8–1.2)
Prothrombin Time: 14.1 s (ref 11.4–15.2)

## 2023-11-16 LAB — HEPARIN LEVEL (UNFRACTIONATED): Heparin Unfractionated: 0.58 [IU]/mL (ref 0.30–0.70)

## 2023-11-16 MED ORDER — ATORVASTATIN CALCIUM 80 MG PO TABS: 80.0000 mg | ORAL_TABLET | Freq: Every day | ORAL | 1 refills | Status: DC

## 2023-11-16 MED ORDER — ENSURE ENLIVE PO LIQD: 237.0000 mL | Freq: Three times a day (TID) | ORAL | 12 refills | Status: DC

## 2023-11-16 MED ORDER — MIDAZOLAM HCL 2 MG/2ML IJ SOLN
INTRAMUSCULAR | Status: AC
  Filled 2023-11-16: qty 2

## 2023-11-16 MED ORDER — HEPARIN BOLUS VIA INFUSION
1100.0000 [IU] | Freq: Once | INTRAVENOUS | Status: DC
  Filled 2023-11-16: qty 1100

## 2023-11-16 MED ORDER — LIDOCAINE HCL (PF) 1 % IJ SOLN
10.0000 mL | Freq: Once | INTRAMUSCULAR | Status: AC
  Administered 2023-11-16: 10 mL via INTRADERMAL
  Filled 2023-11-16: qty 10

## 2023-11-16 MED ORDER — APIXABAN 5 MG PO TABS: 5.0000 mg | ORAL_TABLET | Freq: Two times a day (BID) | ORAL | Status: DC

## 2023-11-16 MED ORDER — FENTANYL CITRATE (PF) 100 MCG/2ML IJ SOLN
INTRAMUSCULAR | Status: AC | PRN
  Administered 2023-11-16: 50 ug via INTRAVENOUS
  Administered 2023-11-16: 25 ug via INTRAVENOUS

## 2023-11-16 MED ORDER — PANTOPRAZOLE SODIUM 40 MG PO TBEC: 40.0000 mg | DELAYED_RELEASE_TABLET | Freq: Every day | ORAL | 0 refills | Status: DC

## 2023-11-16 MED ORDER — FENTANYL CITRATE (PF) 100 MCG/2ML IJ SOLN
INTRAMUSCULAR | Status: AC
Start: 2023-11-16 — End: ?
  Filled 2023-11-16: qty 2

## 2023-11-16 MED ORDER — MIDAZOLAM HCL 2 MG/2ML IJ SOLN
INTRAMUSCULAR | Status: AC | PRN
  Administered 2023-11-16: .5 mg via INTRAVENOUS
  Administered 2023-11-16: 1 mg via INTRAVENOUS

## 2023-11-16 NOTE — TOC Transition Note (Signed)
 Transition of Care [facility]) - Discharge Note   Patient Details  Name: Debra [name] MRN: [id number] Date of Birth: 1956-05-22  Transition of Care [facility]) CM/SW Contact:  Truddie Hidden, RN Phone Number: 11/16/2023, 4:34 PM   Clinical Narrative:    Patient off floor. Patient spouse declined HH. TOC signing off          Patient Goals and CMS Choice            Discharge Placement                       Discharge Plan and Services Additional resources added to the After Visit Summary for                                       Social Drivers of Health (SDOH) Interventions SDOH Screenings   Food Insecurity: No Food Insecurity (11/11/2023)  Housing: Unknown (11/11/2023)  Transportation Needs: No Transportation Needs (11/11/2023)  Utilities: Not At Risk (11/11/2023)  Financial Resource Strain: Low Risk  (06/15/2023)   Received from [facility]  Physical Activity: Unknown (12/17/2017)  Social Connections: Moderately Isolated (11/11/2023)  Stress: No Stress Concern Present (12/17/2017)  Tobacco Use: Medium Risk (11/10/2023)     Readmission Risk Interventions     No data to display

## 2023-11-16 NOTE — Progress Notes (Signed)
 Referring Provider(s): Dr. Shonna Chock, MD  Supervising Physician: Irish Lack  Patient Status:  [facility] - In-pt  Chief Complaint:  Intractable nausea and vomiting; liver lesion; request for image guided liver lesion biopsy   Brief History:  Debra [name] is a 68 y.o. female with a past medical history of paroxysmal A-fib, anxiety, COPD (3 L O2 [place] at baseline), GERD, breast cancer (not currently on chemo), HTN, dyslipidemia.  She presented to the ER on 11/10/2023 for abdominal pain and bodyaches.  A CT ordered as part of ER workup revealed nonspecific low-attenuation lesions in the liver, largest measuring 1.3 cm diameter. She was admitted for AKI, pneumonia, intractable vomiting, metastatic disease.  Neurology was consulted for findings of infarcts in posterior L MCA and inferior left cerebellum (minimal symptoms right nasolabial fold flattening and dysarthria.).  Hematology oncology was consulted for multiple liver lesions suspected metastatic from breast cancer.  Dr. Cathie Hoops switched anticoagulation from Eliquis to heparin and consulted interventional radiology for image guided hepatic lesion biopsy on 3/31.   Interventional Radiology was requested for liver lesion biopsy. Request was reviewed by Dr. Fredia Sorrow, who approved right supraclavicular lymph node biopsy instead, as liver lesions are likely too small for biopsy under ultrasound guidance. Patient is scheduled for same in IR today.  All labs and medications are within acceptable parameters. No pertinent allergies. Patient has been NPO since midnight.    Currently without any significant complaints. Patient alert and laying in bed,calm. Denies any fevers, headache, chest pain, SOB, cough, abdominal pain, nausea, vomiting or bleeding.       Allergies: Cephalexin and Strawberry extract  Medications: Prior to Admission medications   Medication Sig Start Date End Date Taking? Authorizing Provider  acetaminophen (TYLENOL) 500 MG tablet  Take 1,500 mg by mouth 2 (two) times daily as needed for moderate pain.    [provider]  acidophilus (RISAQUAD) CAPS capsule Take 1 capsule by mouth daily. 12/18/17   Gouru, Deanna Artis, MD  albuterol (PROVENTIL) (2.5 MG/3ML) 0.083% nebulizer solution Take 2.5 mg by nebulization every 4 (four) hours as needed for wheezing or shortness of breath.  12/02/14   [provider]  albuterol (VENTOLIN HFA) 108 (90 Base) MCG/ACT inhaler Inhale 2 puffs into the lungs every 6 (six) hours as needed for shortness of breath or wheezing. 04/26/20   [provider]  alendronate (FOSAMAX) 70 MG tablet Take 70 mg by mouth every Sunday. 12/15/17   [provider]  apixaban (ELIQUIS) 5 MG TABS tablet Take 1 tablet (5 mg total) by mouth 2 (two) times daily. 11/10/23   Debbe Odea, MD  citalopram (CELEXA) 10 MG tablet Take 10 mg by mouth daily. 04/02/21   [provider]  Cyanocobalamin (B-12) 1000 MCG CAPS Take 1,000 mcg by mouth daily. 04/24/20   [provider]  diltiazem (CARDIZEM CD) 120 MG 24 hr capsule Take 1 capsule (120 mg total) by mouth daily. 01/23/23 01/18/24  Debbe Odea, MD  GAVILAX 17 GM/SCOOP powder SMARTSIG:17 Gram(s) By Mouth Daily PRN 06/19/22   [provider]  lidocaine-prilocaine (EMLA) cream APPLY 1 APPLICATION TOPICALLY AS NEEDED. 05/16/22   Rickard Patience, MD  Melatonin 5 MG CAPS Take 15 mg by mouth at bedtime.    [provider]  naloxone [facility]) nasal spray 4 mg/0.1 mL Place 1 spray into the nose as needed (opioid overdose). Patient not taking: Reported on 06/24/2023    [provider]  ondansetron (ZOFRAN) 8 MG tablet Take 1 tablet (8  mg total) by mouth every 8 (eight) hours as needed for nausea (Nausea or vomiting). Start on the third day after chemotherapy. 06/10/23   Rickard Patience, MD  oxyCODONE (OXY IR/ROXICODONE) 5 MG immediate release tablet Take 5 mg by mouth every 6 (six) hours as needed. 01/08/23   [provider]   pregabalin (LYRICA) 150 MG capsule Take 1 capsule (150 mg total) by mouth 2 (two) times daily. 04/08/21   Mauro Kaufmann, NP  roflumilast (DALIRESP) 500 MCG TABS tablet Take 1 tablet by mouth daily. 06/20/22   [provider]  tamoxifen (NOLVADEX) 20 MG tablet Take 1 tablet (20 mg total) by mouth daily. 06/24/23   Rickard Patience, MD  tiotropium (SPIRIVA) 18 MCG inhalation capsule Place 18 mcg into inhaler and inhale daily.    [provider]  TRELEGY ELLIPTA 100-62.5-25 MCG/INH AEPB Inhale 1 puff into the lungs daily. 12/28/20   [provider]     Vital Signs: BP (!) 106/54 (BP Location: Right Arm)   Pulse 82   Temp 97.9 F (36.6 C) (Oral)   Resp 20   Ht 5\' 4"  (1.626 m)   Wt 180 lb (81.6 kg)   SpO2 96%   BMI 30.90 kg/m   Physical Exam Constitutional:      General: She is not in acute distress.    Appearance: Normal appearance.  HENT:     Mouth/Throat:     Mouth: Mucous membranes are dry.  Cardiovascular:     Rate and Rhythm: Normal rate and regular rhythm.     Heart sounds: No murmur heard. Pulmonary:     Effort: Pulmonary effort is normal.     Breath sounds: Normal breath sounds. No wheezing.  Abdominal:     General: Abdomen is flat.     Tenderness: There is no abdominal tenderness.  Musculoskeletal:        General: Normal range of motion.  Skin:    General: Skin is warm and dry.  Neurological:     Mental Status: She is alert and oriented to person, place, and time.  Psychiatric:        Mood and Affect: Mood normal.        Behavior: Behavior normal.        Thought Content: Thought content normal.        Judgment: Judgment normal.      Labs:  CBC: Recent Labs    11/13/23 0157 11/14/23 0430 11/15/23 0541 11/16/23 0341  WBC 9.3 10.4 11.0* 11.0*  HGB 9.1* 8.2* 8.1* 7.9*  HCT 26.7* 24.3* 24.3* 23.2*  PLT 231 259 266 246    COAGS: Recent Labs    11/13/23 0157 11/13/23 0853 11/13/23 1422 11/14/23 0430 11/15/23 0541  11/16/23 0341  INR 1.6*  --   --   --   --  1.1  APTT 56*   < > 87* 93* 78* 65*   < > = values in this interval not displayed.    BMP: Recent Labs    11/10/23 2019 11/11/23 0041 11/12/23 0555 11/13/23 0157  NA 134* 134* 137 142  K 3.5 3.7 3.3* 3.7  CL 95* 100 103 108  CO2 25 24 26 27   GLUCOSE 97 85 96 110*  BUN 14 13 9 9   CALCIUM 9.6 8.6* 8.5* 8.7*  CREATININE 0.79 0.77 0.93 0.81  GFRNONAA >60 >60 >60 >60    LIVER FUNCTION TESTS: Recent Labs    11/10/23 2019 11/11/23 0510 11/12/23 0555 11/13/23 0157  BILITOT 1.3* 1.0 0.7 0.6  AST 112* 85* 74* 68*  ALT 171* 132* 120* 113*  ALKPHOS 59 49 46 49  PROT 7.7 5.9* 6.0* 6.2*  ALBUMIN 3.6 2.8* 2.8* 2.9*    Assessment and Plan:  Liver lesions; request for image guided liver lesion biopsy. Approved for ultrasound-guided right-sided supraclavicular lymph node biopsy. - N.p.o. midnight prior to procedure - Orders for holding heparin prior to procedure placed - Procedure discussed and planned for 11/16/2023 by Dr. Fredia Sorrow - 3/31 labs pending - afebrile, VSS at time of exam  Electronically Signed: Sable Feil, PA-C 11/16/2023, 8:51 AM     I spent a total of 15 Minutes at the the patient's bedside AND on the patient's hospital floor or unit, greater than 50% of which was counseling/coordinating care for lymphadenopathy with consideration for biopsy.
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
## 2023-11-16 NOTE — Progress Notes (Signed)
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 lumbar spine which are concerning for osseous metastasis. 3. There are 2 wedge-shaped areas of peripheral hypoenhancement within the proximal portion of the spleen which are concerning for splenic infarcts. 4. There is a 1.3 cm nodule in the left adrenal gland which is indeterminate with only mild loss of signal on the out of phase sequences. This is a new finding when compared with the remote CT of the abdomen pelvis from 09/17/2020. Differential considerations include lipid poor adenoma versus a adrenal metastasis. 5. Small right pleural effusion. 6. Partially visualized mass within the superior segment of right lower lobe. 7. Status post cholecystectomy. Mild intrahepatic bile duct dilatation. Fusiform dilatation of the common bile duct which measures 1.3 cm proximally. No signs of choledocholithiasis or mass. Electronically Signed   By: Signa Kell M.D.   On: 11/12/2023 05:07   CT ANGIO HEAD NECK W WO CM Addendum Date: 11/11/2023 ADDENDUM REPORT: 11/11/2023 23:37 ADDENDUM:  Findings discussed with Mariah Milling, NP via telephone at 10:33 p.m. Electronically Signed   By: Feliberto Harts M.D.   On: 11/11/2023 23:37   Result Date: 11/11/2023 CLINICAL DATA:  Stroke/TIA, determine embolic source EXAM: CT ANGIOGRAPHY HEAD AND NECK WITH AND WITHOUT CONTRAST TECHNIQUE: Multidetector CT imaging of  the head and neck was performed using the standard protocol during bolus administration of intravenous contrast. Multiplanar CT image reconstructions and MIPs were obtained to evaluate the vascular anatomy. Carotid stenosis measurements (when applicable) are obtained utilizing NASCET criteria, using the distal internal carotid diameter as the denominator. RADIATION DOSE REDUCTION: This exam was performed according to the departmental dose-optimization program which includes automated exposure control, adjustment of the mA and/or kV according to patient size and/or use of iterative reconstruction technique. CONTRAST:  75mL OMNIPAQUE IOHEXOL 350 MG/ML SOLN COMPARISON:  Same day MRI head. FINDINGS: CTA NECK FINDINGS Aortic arch: Aortic atherosclerosis. 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 Great vessel origins are patent. Right carotid system: Atherosclerosis at the carotid bifurcation without greater than 50% stenosis. Left carotid system: Atherosclerosis at the carotid bifurcation without greater than 50% stenosis. Vertebral arteries: Codominant. No evidence of dissection, stenosis (50% or greater), or occlusion. Skeleton: No evidence of acute abnormality on limited assessment. Multilevel degenerative change. Other neck: Approximately 2.3 cm left thyroid nodule. Upper chest: Please see same day CT chest for intrathoracic findings. Emphysema. Review of the MIP images confirms the above findings CTA HEAD FINDINGS Anterior circulation: Bilateral intracranial ICAs are patent. Approximately 2 mm medially directed outpouching arising from the right cavernous  ICA, which could represent an infundibulum or aneurysm. Bilateral M1 MCAs are patent. 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 Bilateral ACAs are patent without proximal hemodynamically significant stenosis. Posterior circulation: Bilateral intradural vertebral arteries, basilar artery and bilateral posterior cerebral arteries are patent without proximal hemodynamically significant stenosis. Venous sinuses: As permitted by contrast timing, patent. Review of the MIP images confirms the above findings The ordering provider has been paged at the time of dictation for call of report. IMPRESSION: 1. 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 2. Approximately 2 mm medially directed outpouching arising from the right cavernous ICA, which could represent an infundibulum or aneurysm. 3. Please see same day CT chest for intrathoracic findings. 4. Approximately 2.3 cm left thyroid nodule. Recommend thyroid US (ref: J Am Coll Radiol. 2015 Feb;12(2): 143-50). 5. Aortic Atherosclerosis (ICD10-I70.0) and Emphysema (ICD10-J43.9). Electronically Signed: By: Feliberto Harts M.D. On: 11/11/2023 23:14   MR BRAIN W WO CONTRAST Result Date: 11/11/2023 CLINICAL DATA:  right facial weakness, malignancy also on the ddx EXAM: MRI HEAD WITHOUT AND WITH CONTRAST TECHNIQUE: Multiplanar, multiecho pulse sequences of the brain and surrounding structures were obtained without and with intravenous contrast. CONTRAST:  8mL GADAVIST GADOBUTROL 1 MMOL/ML IV SOLN COMPARISON:  CT head November 11, 2023. 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 Mild edema without mass effect. No midline shift. Probable additional punctate acute infarct in the inferior left cerebellum. No evidence of acute hemorrhage, mass lesion, or hydrocephalus. Vascular: Major arterial flow voids are maintained at the skull base. Skull and upper cervical  spine: Normal marrow signal. Sinuses/Orbits: Clear sinuses.  No acute orbital findings. IMPRESSION: 1. 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 Probable additional punctate acute infarct in the inferior left cerebellum. These results will be called to the ordering clinician or representative by the Radiologist Assistant, and communication documented in the PACS or [employer]. Electronically Signed   By: Feliberto Harts M.D.   On: 11/11/2023 20:27   CT CHEST W CONTRAST Result Date: 11/11/2023 CLINICAL DATA:  Liver lesions with history  of breast cancer. EXAM: CT CHEST WITH CONTRAST TECHNIQUE: Multidetector CT imaging of the chest was performed during intravenous contrast administration. RADIATION DOSE REDUCTION: This exam was performed according to the departmental dose-optimization program which includes automated exposure control, adjustment of the mA and/or kV according to patient size and/or use of iterative reconstruction technique. CONTRAST:  75mL OMNIPAQUE IOHEXOL 300 MG/ML  SOLN COMPARISON:  CT abdomen and pelvis 11/10/2023. FINDINGS: Cardiovascular: Heart is borderline enlarged/mildly enlarged. Aorta is normal in size. There is no pericardial effusion. There are atherosclerotic calcifications of the aorta. Mediastinum/Nodes: There is a heterogeneous left thyroid nodule attaining cystic and solid components measuring 2.6 x 1.5 cm. There are multiple enlarged right paratracheal lymph nodes measuring up to 2.0 x 2.9 cm. Enlarged subcarinal lymph node measures 1.2 cm short axis. There is a prominent right lower esophageal lymph node measuring 8 mm short axis. There are multiple enlarged right hilar lymph nodes measuring up to 1.4 x 1.8 cm. Visualized esophagus is within normal limits. Lungs/Pleura: Mild emphysematous changes are present. There is scarring in both lung apices. Multifocal airspace disease is seen throughout the  right lower lobe, predominantly posteriorly and peripherally. Some areas are slightly nodular for example superior segment measuring 3.2 by 2.0 cm. A small air-fluid level seen in this region. No other air-fluid levels are seen. Additional nodular density seen in the right lower lobe image 5/92 measuring 9 mm. Smaller nodules are seen scattered throughout the right lower lobe measuring 5 mm or less. There is a trace right pleural effusion. There is a right middle lobe nodule measuring 5 mm image 5/122. There are prominent intrapulmonary lymph nodes along the right major fissure. There are 2 mm nodular densities in the left upper lobe. Left lung is otherwise clear. Upper Abdomen: Subcentimeter hypodensities in the liver and left adrenal mass are unchanged. Please see CT abdomen and pelvis performed same day for further description. Musculoskeletal: There is anterior intramuscular chest wall edema on the right. Focal density is seen in the medial right breast extending to the skin surface measuring 1.2 x 2.3 cm image 3/81. There some associated diffuse right breast skin thickening. No acute fracture. There is a single 5 mm sclerotic density in the T3 vertebral body. IMPRESSION: 1. Multifocal airspace disease throughout the right lower lobe, predominantly posteriorly and peripherally. Single area is slightly nodular in the superior segment containing an air-fluid level worrisome for necrotic pneumonia or necrotic nodule. Findings may be infectious/inflammatory, but neoplasm not excluded. 2. Trace right pleural effusion. 3. Mediastinal and right hilar lymphadenopathy. 4. Additional right lower lobe and right middle lobe pulmonary nodules measuring 5 mm. 5. Left thyroid nodule measuring 2.6 cm. Recommend further evaluation with ultrasound. 6. Focal density in the medial right breast extending to the skin surface measuring 1.2 x 2.3 cm. Correlate with physical exam. 7. Single 5 mm sclerotic density in the T3 vertebral  body. Aortic Atherosclerosis (ICD10-I70.0) and Emphysema (ICD10-J43.9). Electronically Signed   By: Darliss Cheney M.D.   On: 11/11/2023 17:53   CT HEAD WO CONTRAST ( ) Result Date: 11/11/2023 CLINICAL DATA:  Right facial weakness. EXAM: CT HEAD WITHOUT CONTRAST TECHNIQUE: Contiguous axial images were obtained from the base of the skull through the vertex without intravenous contrast. RADIATION DOSE REDUCTION: This exam was performed according to the departmental dose-optimization program which includes automated exposure control, adjustment of the mA and/or kV according to patient size and/or use of iterative reconstruction technique. COMPARISON:  Head CT 07/21/2022 FINDINGS: Brain: There are new  small hypodensities in the left frontoparietal white matter at the level of the posterior centrum semiovale (series 3, image 20), and there is a new subcentimeter focus of cortical hypodensity at the level of the left parietal operculum (series 6, image 46), suspicious for recent infarcts. No intracranial hemorrhage, mass, midline shift, or extra-axial fluid collection is identified. Cerebral volume is normal. The ventricles are normal in size. Vascular: No hyperdense vessel. Skull: No acute fracture or suspicious lesion. Sinuses/Orbits: Visualized paranasal sinuses and mastoid air cells are clear. Bilateral cataract extraction. Other: None. 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 A brain MRI is in progress and will provide further evaluation. Electronically Signed   By: Sebastian Ache M.D.   On: 11/11/2023 15:13   DG Chest 2 View Result Date: 11/10/2023 CLINICAL DATA:  dyspnea EXAM: CHEST - 2 VIEW COMPARISON:  Chest x-ray 07/21/2022, chest x-ray 02/15/2021 FINDINGS: The heart and mediastinal contours are unchanged. Prominent hilar vasculature. No focal consolidation. No pulmonary edema. No pleural effusion. No pneumothorax. No acute osseous abnormality. IMPRESSION:  Prominent hilar vasculature suggestive of pulmonary venous congestion. Underlying lymph nodes not excluded. Electronically Signed   By: Tish Frederickson M.D.   On: 11/10/2023 23:50   CT ABDOMEN PELVIS W CONTRAST Result Date: 11/10/2023 CLINICAL DATA:  Acute nonlocalized abdominal pain. Vomiting and diarrhea. EXAM: CT ABDOMEN AND PELVIS WITH CONTRAST TECHNIQUE: Multidetector CT imaging of the abdomen and pelvis was performed using the standard protocol following bolus administration of intravenous contrast. RADIATION DOSE REDUCTION: This exam was performed according to the departmental dose-optimization program which includes automated exposure control, adjustment of the mA and/or kV according to patient size and/or use of iterative reconstruction technique. CONTRAST:  OMNIPAQUE IOHEXOL 300 MG/ML  SOLN COMPARISON:  None Available. FINDINGS: Lower chest: Focal consolidation suggested in the right lung base with small right pleural effusion. This may represent pneumonia or less likely aspiration. Emphysematous changes in the lungs. Hepatobiliary: Multiple scattered low-attenuation lesions in the liver, most are subcentimeter in size. Largest is in the medial segment left lobe measuring 1.3 cm in diameter. Enhancement pattern is indeterminate. Consider follow-up MRI for further characterization. The gallbladder is not visualized, likely surgically absent. Mild intrahepatic bile duct dilatation is most likely due to postoperative change. No common duct stones are identified. Pancreas: Unremarkable. No pancreatic ductal dilatation or surrounding inflammatory changes. Spleen: Segmental low-attenuation in the upper spleen likely represents a splenic infarct. Spleen size is normal. Adrenals/Urinary Tract: 1.3 cm diameter left adrenal gland nodule with density measurement of 69 Hounsfield units. Renal nephrograms are symmetrical. No solid mass identified. No hydronephrosis or hydroureter. Bladder is normal.  Stomach/Bowel: Stomach is within normal limits. Appendix appears normal. No evidence of bowel wall thickening, distention, or inflammatory changes. Vascular/Lymphatic: Aortic atherosclerosis. No enlarged abdominal or pelvic lymph nodes. Reproductive: Uterus and bilateral adnexa are unremarkable. Other: No abdominal wall hernia or abnormality. No abdominopelvic ascites. Musculoskeletal: Postoperative fixation of the right hip. No acute bony abnormalities. IMPRESSION: 1. Nonspecific low-attenuation lesions in the liver, largest measuring 1.3 cm diameter. Enhancement pattern is indeterminate. Consider follow-up MRI for further characterization. 2. Mild intrahepatic bile duct dilatation is likely postoperative in the setting of cholecystectomy. 3. Left adrenal mass measuring 1.3 cm, probable benign adenoma. Recommend follow-up adrenal washout CT in 1 year. If stable for = 1 year, no further follow-up imaging. JACR 2017 Aug; 14(8):1038-44, JCAT 2016 Mar-Apr; 40(2):194-200, Urol J 2006 Spring; 3(2):71-4. 4. Aortic atherosclerosis. 5. Focal area of consolidation suggested  in the right lung base with small right pleural effusion, likely pneumonia. Electronically Signed   By: Burman Nieves M.D.   On: 11/10/2023 23:30    Assessment and plan-   # Acute stroke Patient has atrial fibrillation and was on Eliquis 5 mg twice daily. Stroke is likely secondary to decreased Eliquis absorption due to her nausea vomiting diarrhea symptoms as well as possible hypercarbia state from recurrent malignancy. Neurology recommendation reviewed. PT OT--> rehab   # Multiple liver lesions, suspect metastatic disease from breast cancer. Check tumor markers. Recommending liver lesion biopsy to minimize anticoagulation interruption.- plan today.  She will follow up with me in 1 week to go over pathology results and future plan.   # Atrial fibrillation Currently on heparin drip for anticoagulation.    # Multifocal airspace  disease, mediastinal and right hilar lymphadenopathy, lung nodules.   Focal density in the medial right breast extending to the skin surface.  I do not see a physical examination finding correlates to the CT finding. Will obtain outpatient PET scan for evaluation.  # Normocytic anemia, hemoglobin is stable.  No bleeding events. Iron panel is not consistent with iron deficiency. Monitor.  # Thyroid nodule: Outpatient workup.  Thank you for allowing me to participate in the care of this patient.   Rickard Patience, MD, PhD Hematology Oncology 11/16/2023

## 2023-11-16 NOTE — Progress Notes (Signed)
 Patient returned from [place]. Bedside handoff with Page, RN completed. Patient's biopsy site has bandaid covering it with scant amount of old drainage.

## 2023-11-16 NOTE — Care Management Important Message (Signed)
 Important Message  Patient Details  Name: CALEA HRIBAR MRN: [id number] Date of Birth: October 13, 1955   Important Message Given:  Yes - Medicare IM     Cristela Blue, CMA 11/16/2023, 11:35 AM

## 2023-11-16 NOTE — Progress Notes (Signed)
 PT Cancellation Note  Patient Details Name: CIARRA BRADDY MRN: [id number] DOB: Sep 29, 1955   Cancelled Treatment:    Reason Eval/Treat Not Completed: Patient at procedure or test/unavailable. Per RN pt off floor for procedure. PT to re-attempt as able.    Delphia Grates. Fairly IV, PT, DPT Physical Therapist- [place]  [facility]  11/16/2023, 2:14 PM

## 2023-11-16 NOTE — Discharge Summary (Signed)
 Physician Discharge Summary   Patient: Debra [name] MRN: [id number] DOB: 02/02/1956  Admit date:     11/10/2023  Discharge date: 11/16/23  Discharge Physician: Enedina Finner   PCP: Barbette Reichmann, MD   Recommendations at discharge:   follow-up Dr. Cathie Hoops next week to discuss results of liver biopsy follow-up PCP in 1 to 2 weeks resume your eliquis from today evening follow-up [facility] neurology Dr. Sherryll Burger in 2 to 3 weeks follow-up neurosurgery Dr. Katrinka Blazing as outpatient   Discharge Diagnoses: Principal Problem:   Intractable nausea and vomiting Active Problems:   Paroxysmal atrial fibrillation (HCC)   Chronic constipation   Invasive carcinoma of breast (HCC)   HER2-positive carcinoma of breast (HCC)   Port-A-Cath in place   Aspiration pneumonia (HCC)   Chronic obstructive pulmonary disease (COPD) (HCC)   History of breast cancer   Obesity (BMI 30-39.9)   Liver lesion   Cerebrovascular accident (CVA) (HCC)   Debra [name] is a 68 y.o. Caucasian female with medical history significant for paroxysmal atrial fibrillation, anxiety, COPD, on home O2 at 3 L/min, diastolic function, GERD, and breast cancer, not currently on chemotherapy, hypertension and dyslipidemia, who presented to the emergency room with acute onset of recurrent nausea and vomiting over the last week and a half.  She has not had any solid food for the past week.  She has been having significant anorexia secondarily.  She denied any bilious vomitus or hematemesis.     Nausea/vomiting/diarrhea Transaminitis with new liver lesions history of breast cancer -- No signs obstruction on CT. Resolved. Likely 2/2 metastatic disease.  --Transaminitis improving, likely 2/2 liver lesions. Hepatitis panel neg.  --GI consulted, has signed off. No diarrhea here so no stool testing has been performed -- patient is s/p biopsy from liver lesions  by IR. Ok to resume Eliquis from [employer] per Dr Fredia Sorrow -- oncology (Dr. Cathie Hoops) input appreciated--ok to  d/c home today   Acute stroke, embolic --Incidental note of right sided facial weakness yesterday.  --MRI shows multiple small acute infarcts posterior left MCA distribution, left cerebellum. Hypercoagulable state from malignancy? Cardiac embolic? LDL 131 - neurology recommends continue IV heparin drip till biopsy is over and switch back to eliquis. Oncology does not feel this is eliquis failure. Patient had hard time absorbing it due to nausea vomiting and diarrhea. - PT/OT-- recommends rehab -- patents given elevated liver enzymes     Right ICA outpouching On CTA --Dr. Katrinka Blazing of neurosurgery -- recommends outpatient follow-up     Normocytic anemia --Hgb 8.7 today from 10-11s previously, no report of bleeding, suspect at least partially dilutional     COPD/Chronic hypoxic respiratory failure/chronic home oxygen Stable, at baseline.   --breo/incruse for home trelegy --home o2, stable on home dose   GAD --home celexa    a-fib, chronic --home apixaban-- currently on IV heparin drip till liver biopsy completed on Monday--will resume at discharge --cont home diltiazem    chronic pain --home pregabalin, oxy   History breast cancer --tamoxifen will be resumed.f/u Dr Cathie Hoops     Procedures: ultrasound-guided liver biopsy by IR on 31st March Family communication : husband at bedside Consults : neurology, oncology,IR CODE STATUS: DNR DVT Prophylaxis : heparin drip-->eliquis     Pain control - [place] Controlled Substance Reporting System database was reviewed. and patient was instructed, not to drive, operate heavy machinery, perform activities at heights, swimming or participation in water activities or provide baby-sitting services while on Pain, Sleep and Anxiety Medications;  until their outpatient Physician has advised to do so again. Also recommended to not to take more than prescribed Pain, Sleep and Anxiety Medications.  Disposition: Home health Diet recommendation:   Discharge Diet Orders (From admission, onward)     Start     Ordered   11/16/23 0000  Diet - low sodium heart healthy        11/16/23 1526           Cardiac diet DISCHARGE MEDICATION: Allergies as of 11/16/2023       Reactions   Cephalexin Hives   Strawberry Extract Hives        Medication List     STOP taking these medications    naloxone 4 MG/0.1ML Liqd nasal spray kit Commonly known as: NARCAN       TAKE these medications    acetaminophen 500 MG tablet Commonly known as: TYLENOL Take 1,500 mg by mouth 2 (two) times daily as needed for moderate pain.   acidophilus Caps capsule Take 1 capsule by mouth daily.   albuterol (2.5 MG/3ML) 0.083% nebulizer solution Commonly known as: PROVENTIL Take 2.5 mg by nebulization every 4 (four) hours as needed for wheezing or shortness of breath.   albuterol 108 (90 Base) MCG/ACT inhaler Commonly known as: VENTOLIN HFA Inhale 2 puffs into the lungs every 6 (six) hours as needed for shortness of breath or wheezing.   alendronate 70 MG tablet Commonly known as: FOSAMAX Take 70 mg by mouth every Sunday.   apixaban 5 MG Tabs tablet Commonly known as: ELIQUIS Take 1 tablet (5 mg total) by mouth 2 (two) times daily.   atorvastatin 80 MG tablet Commonly known as: LIPITOR Take 1 tablet (80 mg total) by mouth daily. Start taking on: November 17, 2023   B-12 1000 MCG Caps Take 1,000 mcg by mouth daily.   citalopram 10 MG tablet Commonly known as: CELEXA Take 10 mg by mouth daily.   diltiazem 120 MG 24 hr capsule Commonly known as: CARDIZEM CD Take 1 capsule (120 mg total) by mouth daily.   feeding supplement Liqd Take 237 mLs by mouth 3 (three) times daily between meals.   GaviLAX 17 GM/SCOOP powder Generic drug: polyethylene glycol powder SMARTSIG:17 Gram(s) By Mouth Daily PRN   lidocaine-prilocaine cream Commonly known as: EMLA APPLY 1 APPLICATION TOPICALLY AS NEEDED.   Melatonin 5 MG Caps Take 15 mg by  mouth at bedtime.   ondansetron 8 MG tablet Commonly known as: Zofran Take 1 tablet (8 mg total) by mouth every 8 (eight) hours as needed for nausea (Nausea or vomiting). Start on the third day after chemotherapy.   oxyCODONE 5 MG immediate release tablet Commonly known as: Oxy IR/ROXICODONE Take 5 mg by mouth every 6 (six) hours as needed.   pantoprazole 40 MG tablet Commonly known as: PROTONIX Take 1 tablet (40 mg total) by mouth daily. Start taking on: November 17, 2023   pregabalin 150 MG capsule Commonly known as: LYRICA Take 1 capsule (150 mg total) by mouth 2 (two) times daily.   roflumilast 500 MCG Tabs tablet Commonly known as: DALIRESP Take 1 tablet by mouth daily.   tamoxifen 20 MG tablet Commonly known as: NOLVADEX Take 1 tablet (20 mg total) by mouth daily.   tiotropium 18 MCG inhalation capsule Commonly known as: SPIRIVA Place 18 mcg into inhaler and inhale daily.   Trelegy Ellipta 100-62.5-25 MCG/INH Aepb Generic drug: Fluticasone-Umeclidin-Vilant Inhale 1 puff into the lungs daily.  Follow-up Information     Barbette Reichmann, MD. Schedule an appointment as soon as possible for a visit in 1 week(s).   Specialty: Internal Medicine Contact information: [address] [place] [address] [phone]         Rickard Patience, MD. Schedule an appointment as soon as possible for a visit in 1 week(s).   Specialty: Oncology Why: f/u Liver biospy results Contact information: [address] [place] [address] [phone]                    Condition at discharge: fair  The results of significant diagnostics from this hospitalization (including imaging, microbiology, ancillary and laboratory) are listed below for reference.   Imaging Studies: ECHOCARDIOGRAM COMPLETE Result Date: 11/12/2023    ECHOCARDIOGRAM REPORT   Patient Name:   FLANNERY CAVALLERO Date of Exam: 11/11/2023 Medical Rec #:  [id number]     Height:       64.0 in Accession #:    [id number]   Weight:       180.0 lb Date of Birth:  [birth year]     BSA:          1.871 m Patient Age:    68 years     BP:           110/92 mmHg Patient Gender: F            HR:           80 bpm. Exam Location:  [facility] Procedure: 2D Echo, Cardiac Doppler and Color Doppler (Both Spectral and Color            Flow Doppler were utilized during procedure). Indications:     Elevated Troponin  History:         Patient has prior history of Echocardiogram examinations, most                  recent 04/25/2022. COPD, Arrythmias:Atrial Fibrillation; Risk                  Factors:Hypertension and Dyslipidemia. Obstructive sleep apnea.  Sonographer:     Daphine Deutscher RDCS Referring Phys:  [id number] Wilfred Curtis LOVF Diagnosing Phys: Julien Nordmann MD IMPRESSIONS  1. Left ventricular ejection fraction, by estimation, is 60 to 65%. The left ventricle has normal function. The left ventricle has no regional wall motion abnormalities. There is mild left ventricular hypertrophy. Left ventricular diastolic parameters are consistent with Grade I diastolic dysfunction (impaired relaxation).  2. Right ventricular systolic function is normal. The right ventricular size is normal. There is normal pulmonary artery systolic pressure. The estimated right ventricular systolic pressure is 21.3 mmHg.  3. The mitral valve is normal in structure. Mild to moderate mitral valve regurgitation. No evidence of mitral stenosis.  4. The aortic valve is normal in structure. Aortic valve regurgitation is not visualized. No aortic stenosis is present.  5. The inferior vena cava is normal in size with greater than 50% respiratory variability, suggesting right atrial pressure of 3 mmHg. FINDINGS  Left Ventricle: Left ventricular ejection fraction, by estimation, is 60 to 65%. The left ventricle has normal function. The left ventricle has no regional wall motion abnormalities. Strain was performed and the global  longitudinal strain is indeterminate. The left ventricular internal cavity size was normal in size. There is mild left ventricular hypertrophy. Left ventricular diastolic parameters are consistent with Grade I diastolic dysfunction (impaired relaxation). Right Ventricle: The right ventricular size  is normal. No increase in right ventricular wall thickness. Right ventricular systolic function is normal. There is normal pulmonary artery systolic pressure. The tricuspid regurgitant velocity is 2.02 m/s, and  with an assumed right atrial pressure of 5 mmHg, the estimated right ventricular systolic pressure is 21.3 mmHg. Left Atrium: Left atrial size was normal in size. Right Atrium: Right atrial size was normal in size. Pericardium: There is no evidence of pericardial effusion. Mitral Valve: The mitral valve is normal in structure. Mild to moderate mitral valve regurgitation. No evidence of mitral valve stenosis. Tricuspid Valve: The tricuspid valve is normal in structure. Tricuspid valve regurgitation is not demonstrated. No evidence of tricuspid stenosis. Aortic Valve: The aortic valve is normal in structure. Aortic valve regurgitation is not visualized. No aortic stenosis is present. Pulmonic Valve: The pulmonic valve was normal in structure. Pulmonic valve regurgitation is not visualized. No evidence of pulmonic stenosis. Aorta: The aortic root is normal in size and structure. Venous: The inferior vena cava is normal in size with greater than 50% respiratory variability, suggesting right atrial pressure of 3 mmHg. IAS/Shunts: No atrial level shunt detected by color flow Doppler. Additional Comments: 3D was performed not requiring image post processing on an independent workstation and was indeterminate.  LEFT VENTRICLE PLAX 2D LVIDd:         4.90 cm   Diastology LVIDs:         3.10 cm   LV e' medial:    9.36 cm/s LV PW:         1.00 cm   LV E/e' medial:  9.1 LV IVS:        1.00 cm   LV e' lateral:   9.32 cm/s LVOT  diam:     2.00 cm   LV E/e' lateral: 9.2 LV SV:         56 LV SV Index:   30 LVOT Area:     3.14 cm  RIGHT VENTRICLE RV Basal diam:  3.30 cm RV S prime:     18.33 cm/s TAPSE (M-mode): 3.2 cm LEFT ATRIUM             Index        RIGHT ATRIUM           Index LA diam:        3.90 cm 2.08 cm/m   RA Area:     11.60 cm LA Vol (A2C):   50.9 ml 27.21 ml/m  RA Volume:   27.60 ml  14.75 ml/m LA Vol (A4C):   38.2 ml 20.42 ml/m LA Biplane Vol: 44.4 ml 23.73 ml/m  AORTIC VALVE LVOT Vmax:   98.00 cm/s LVOT Vmean:  64.000 cm/s LVOT VTI:    0.179 m  AORTA Ao Root diam: 3.30 cm MITRAL VALVE                TRICUSPID VALVE MV Area (PHT): 3.65 cm     TR Peak grad:   16.3 mmHg MV Decel Time: 208 msec     TR Vmax:        202.00 cm/s MR Peak grad: 82.4 mmHg MR Vmax:      454.00 cm/s   SHUNTS MV E velocity: 85.55 cm/s   Systemic VTI:  0.18 m MV A velocity: 123.50 cm/s  Systemic Diam: 2.00 cm MV E/A ratio:  0.69 Julien Nordmann MD Electronically signed by Julien Nordmann MD Signature Date/Time: 11/12/2023/1:59:01 PM    Final    MR ABDOMEN MRCP W  WO CONTAST Result Date: 11/12/2023 CLINICAL DATA:  Evaluate liver lesions.  Chest mass. EXAM: MRI ABDOMEN WITHOUT AND WITH CONTRAST (INCLUDING MRCP) TECHNIQUE: Multiplanar multisequence MR imaging of the abdomen was performed both before and after the administration of intravenous contrast. Heavily T2-weighted images of the biliary and pancreatic ducts were obtained, and three-dimensional MRCP images were rendered by post processing. CONTRAST:  8mL GADAVIST GADOBUTROL 1 MMOL/ML IV SOLN COMPARISON:  CT AP 11/10/2023 FINDINGS: Lower chest: Small right pleural effusion. Partially visualized mass within the superior segment of right lower lobe noted. Hepatobiliary: There are multifocal bilobar, ill-defined areas of mild increased T2 signal noted within both lobes of liver which show hypoenhancement on the postcontrast images and restricted diffusion. At least 5 lesions are noted involving both  lobes. The largest lesion is in segment 5 measuring 1.1 cm, image 17/8. Lesion in segment 2/3 measures 6 mm, image 10/8. Status post cholecystectomy. Mild intrahepatic bile duct dilatation. Fusiform dilatation of the common bile duct which measures 1.3 cm proximally. No signs of choledocholithiasis or mass. Pancreas:  No main duct dilatation, inflammation or mass identified. Spleen: Within the proximal portion of the spleen there are 2 wedge-shaped areas of peripheral hypoenhancement, image 25/31. Adrenals/Urinary Tract: There is a 1.3 cm nodule in the left adrenal gland which is indeterminate with only mild loss of signal on the out of phase sequences, image 29/9. This is a new finding when compared with the remote CT of the abdomen pelvis from 09/17/2020. Normal right adrenal gland. No kidney mass or signs of obstructive uropathy. Stomach/Bowel: Visualized portions within the abdomen are unremarkable. Vascular/Lymphatic: No aneurysm. Aortic atherosclerosis. No adenopathy. Other:  No ascites. Musculoskeletal: Multifocal abnormal areas of enhancement are identified throughout the visualized portions of the thoracic and lumbar spine which are concerning for osseous metastasis. IMPRESSION: 1. There are multifocal bilobar, ill-defined areas of mild increased T2 signal noted within both lobes of liver which show hypoenhancement on the postcontrast images and restricted diffusion. At least 5 lesions are noted involving both lobes. 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 2. Multifocal abnormal areas of enhancement are identified throughout the visualized portions of the thoracic and lumbar spine which are concerning for osseous metastasis. 3. There are 2 wedge-shaped areas of peripheral hypoenhancement within the proximal portion of the spleen which are concerning for splenic infarcts. 4. There is a 1.3 cm nodule in the left adrenal gland which is indeterminate with only mild loss of signal on the out of phase  sequences. This is a new finding when compared with the remote CT of the abdomen pelvis from 09/17/2020. Differential considerations include lipid poor adenoma versus a adrenal metastasis. 5. Small right pleural effusion. 6. Partially visualized mass within the superior segment of right lower lobe. 7. Status post cholecystectomy. Mild intrahepatic bile duct dilatation. Fusiform dilatation of the common bile duct which measures 1.3 cm proximally. No signs of choledocholithiasis or mass. Electronically Signed   By: Signa Kell M.D.   On: 11/12/2023 05:07   MR 3D Recon At Scanner Result Date: 11/12/2023 CLINICAL DATA:  Evaluate liver lesions.  Chest mass. EXAM: MRI ABDOMEN WITHOUT AND WITH CONTRAST (INCLUDING MRCP) TECHNIQUE: Multiplanar multisequence MR imaging of the abdomen was performed both before and after the administration of intravenous contrast. Heavily T2-weighted images of the biliary and pancreatic ducts were obtained, and three-dimensional MRCP images were rendered by post processing. CONTRAST:  8mL GADAVIST GADOBUTROL 1 MMOL/ML IV SOLN COMPARISON:  CT AP 11/10/2023 FINDINGS: Lower chest: Small right pleural  effusion. Partially visualized mass within the superior segment of right lower lobe noted. Hepatobiliary: There are multifocal bilobar, ill-defined areas of mild increased T2 signal noted within both lobes of liver which show hypoenhancement on the postcontrast images and restricted diffusion. At least 5 lesions are noted involving both lobes. The largest lesion is in segment 5 measuring 1.1 cm, image 17/8. Lesion in segment 2/3 measures 6 mm, image 10/8. Status post cholecystectomy. Mild intrahepatic bile duct dilatation. Fusiform dilatation of the common bile duct which measures 1.3 cm proximally. No signs of choledocholithiasis or mass. Pancreas:  No main duct dilatation, inflammation or mass identified. Spleen: Within the proximal portion of the spleen there are 2 wedge-shaped areas of  peripheral hypoenhancement, image 25/31. Adrenals/Urinary Tract: There is a 1.3 cm nodule in the left adrenal gland which is indeterminate with only mild loss of signal on the out of phase sequences, image 29/9. This is a new finding when compared with the remote CT of the abdomen pelvis from 09/17/2020. Normal right adrenal gland. No kidney mass or signs of obstructive uropathy. Stomach/Bowel: Visualized portions within the abdomen are unremarkable. Vascular/Lymphatic: No aneurysm. Aortic atherosclerosis. No adenopathy. Other:  No ascites. Musculoskeletal: Multifocal abnormal areas of enhancement are identified throughout the visualized portions of the thoracic and lumbar spine which are concerning for osseous metastasis. IMPRESSION: 1. There are multifocal bilobar, ill-defined areas of mild increased T2 signal noted within both lobes of liver which show hypoenhancement on the postcontrast images and restricted diffusion. At least 5 lesions are noted involving both lobes. 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 2. Multifocal abnormal areas of enhancement are identified throughout the visualized portions of the thoracic and lumbar spine which are concerning for osseous metastasis. 3. There are 2 wedge-shaped areas of peripheral hypoenhancement within the proximal portion of the spleen which are concerning for splenic infarcts. 4. There is a 1.3 cm nodule in the left adrenal gland which is indeterminate with only mild loss of signal on the out of phase sequences. This is a new finding when compared with the remote CT of the abdomen pelvis from 09/17/2020. Differential considerations include lipid poor adenoma versus a adrenal metastasis. 5. Small right pleural effusion. 6. Partially visualized mass within the superior segment of right lower lobe. 7. Status post cholecystectomy. Mild intrahepatic bile duct dilatation. Fusiform dilatation of the common bile duct which measures 1.3 cm proximally. No signs of  choledocholithiasis or mass. Electronically Signed   By: Signa Kell M.D.   On: 11/12/2023 05:07   CT ANGIO HEAD NECK W WO CM Addendum Date: 11/11/2023 ADDENDUM REPORT: 11/11/2023 23:37 ADDENDUM: Findings discussed with Mariah Milling, NP via telephone at 10:33 p.m. Electronically Signed   By: Feliberto Harts M.D.   On: 11/11/2023 23:37   Result Date: 11/11/2023 CLINICAL DATA:  Stroke/TIA, determine embolic source EXAM: CT ANGIOGRAPHY HEAD AND NECK WITH AND WITHOUT CONTRAST TECHNIQUE: Multidetector CT imaging of the head and neck was performed using the standard protocol during bolus administration of intravenous contrast. Multiplanar CT image reconstructions and MIPs were obtained to evaluate the vascular anatomy. Carotid stenosis measurements (when applicable) are obtained utilizing NASCET criteria, using the distal internal carotid diameter as the denominator. RADIATION DOSE REDUCTION: This exam was performed according to the departmental dose-optimization program which includes automated exposure control, adjustment of the mA and/or kV according to patient size and/or use of iterative reconstruction technique. CONTRAST:  75mL OMNIPAQUE IOHEXOL 350 MG/ML SOLN COMPARISON:  Same day MRI head. FINDINGS: CTA NECK FINDINGS  Aortic arch: Aortic atherosclerosis. 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 Great vessel origins are patent. Right carotid system: Atherosclerosis at the carotid bifurcation without greater than 50% stenosis. Left carotid system: Atherosclerosis at the carotid bifurcation without greater than 50% stenosis. Vertebral arteries: Codominant. No evidence of dissection, stenosis (50% or greater), or occlusion. Skeleton: No evidence of acute abnormality on limited assessment. Multilevel degenerative change. Other neck: Approximately 2.3 cm left thyroid nodule. Upper chest: Please see same day CT chest for intrathoracic findings. Emphysema. Review of the  MIP images confirms the above findings CTA HEAD FINDINGS Anterior circulation: Bilateral intracranial ICAs are patent. Approximately 2 mm medially directed outpouching arising from the right cavernous ICA, which could represent an infundibulum or aneurysm. Bilateral M1 MCAs are patent. 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 Bilateral ACAs are patent without proximal hemodynamically significant stenosis. Posterior circulation: Bilateral intradural vertebral arteries, basilar artery and bilateral posterior cerebral arteries are patent without proximal hemodynamically significant stenosis. Venous sinuses: As permitted by contrast timing, patent. Review of the MIP images confirms the above findings The ordering provider has been paged at the time of dictation for call of report. IMPRESSION: 1. 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 2. Approximately 2 mm medially directed outpouching arising from the right cavernous ICA, which could represent an infundibulum or aneurysm. 3. Please see same day CT chest for intrathoracic findings. 4. Approximately 2.3 cm left thyroid nodule. Recommend thyroid US (ref: J Am Coll Radiol. 2015 Feb;12(2): 143-50). 5. Aortic Atherosclerosis (ICD10-I70.0) and Emphysema (ICD10-J43.9). Electronically Signed: By: Feliberto Harts M.D. On: 11/11/2023 23:14   MR BRAIN W WO CONTRAST Result Date: 11/11/2023 CLINICAL DATA:  right facial weakness, malignancy also on the ddx EXAM: MRI HEAD WITHOUT AND WITH CONTRAST TECHNIQUE: Multiplanar, multiecho pulse sequences of the brain and surrounding structures were obtained without and with intravenous contrast. CONTRAST:  8mL GADAVIST GADOBUTROL 1 MMOL/ML IV SOLN COMPARISON:  CT head November 11, 2023. 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 Mild edema without mass effect. No midline shift. Probable additional punctate acute  infarct in the inferior left cerebellum. No evidence of acute hemorrhage, mass lesion, or hydrocephalus. Vascular: Major arterial flow voids are maintained at the skull base. Skull and upper cervical spine: Normal marrow signal. Sinuses/Orbits: Clear sinuses.  No acute orbital findings. IMPRESSION: 1. 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 Probable additional punctate acute infarct in the inferior left cerebellum. These results will be called to the ordering clinician or representative by the Radiologist Assistant, and communication documented in the PACS or [employer]. Electronically Signed   By: Feliberto Harts M.D.   On: 11/11/2023 20:27   CT CHEST W CONTRAST Result Date: 11/11/2023 CLINICAL DATA:  Liver lesions with history of breast cancer. EXAM: CT CHEST WITH CONTRAST TECHNIQUE: Multidetector CT imaging of the chest was performed during intravenous contrast administration. RADIATION DOSE REDUCTION: This exam was performed according to the departmental dose-optimization program which includes automated exposure control, adjustment of the mA and/or kV according to patient size and/or use of iterative reconstruction technique. CONTRAST:  75mL OMNIPAQUE IOHEXOL 300 MG/ML  SOLN COMPARISON:  CT abdomen and pelvis 11/10/2023. FINDINGS: Cardiovascular: Heart is borderline enlarged/mildly enlarged. Aorta is normal in size. There is no pericardial effusion. There are atherosclerotic calcifications of the aorta. Mediastinum/Nodes: There is a heterogeneous  left thyroid nodule attaining cystic and solid components measuring 2.6 x 1.5 cm. There are multiple enlarged right paratracheal lymph nodes measuring up to 2.0 x 2.9 cm. Enlarged subcarinal lymph node measures 1.2 cm short axis. There is a prominent right lower esophageal lymph node measuring 8 mm short axis. There are multiple enlarged right hilar lymph nodes measuring up to 1.4 x  1.8 cm. Visualized esophagus is within normal limits. Lungs/Pleura: Mild emphysematous changes are present. There is scarring in both lung apices. Multifocal airspace disease is seen throughout the right lower lobe, predominantly posteriorly and peripherally. Some areas are slightly nodular for example superior segment measuring 3.2 by 2.0 cm. A small air-fluid level seen in this region. No other air-fluid levels are seen. Additional nodular density seen in the right lower lobe image 5/92 measuring 9 mm. Smaller nodules are seen scattered throughout the right lower lobe measuring 5 mm or less. There is a trace right pleural effusion. There is a right middle lobe nodule measuring 5 mm image 5/122. There are prominent intrapulmonary lymph nodes along the right major fissure. There are 2 mm nodular densities in the left upper lobe. Left lung is otherwise clear. Upper Abdomen: Subcentimeter hypodensities in the liver and left adrenal mass are unchanged. Please see CT abdomen and pelvis performed same day for further description. Musculoskeletal: There is anterior intramuscular chest wall edema on the right. Focal density is seen in the medial right breast extending to the skin surface measuring 1.2 x 2.3 cm image 3/81. There some associated diffuse right breast skin thickening. No acute fracture. There is a single 5 mm sclerotic density in the T3 vertebral body. IMPRESSION: 1. Multifocal airspace disease throughout the right lower lobe, predominantly posteriorly and peripherally. Single area is slightly nodular in the superior segment containing an air-fluid level worrisome for necrotic pneumonia or necrotic nodule. Findings may be infectious/inflammatory, but neoplasm not excluded. 2. Trace right pleural effusion. 3. Mediastinal and right hilar lymphadenopathy. 4. Additional right lower lobe and right middle lobe pulmonary nodules measuring 5 mm. 5. Left thyroid nodule measuring 2.6 cm. Recommend further evaluation  with ultrasound. 6. Focal density in the medial right breast extending to the skin surface measuring 1.2 x 2.3 cm. Correlate with physical exam. 7. Single 5 mm sclerotic density in the T3 vertebral body. Aortic Atherosclerosis (ICD10-I70.0) and Emphysema (ICD10-J43.9). Electronically Signed   By: Darliss Cheney M.D.   On: 11/11/2023 17:53   CT HEAD WO CONTRAST ( ) Result Date: 11/11/2023 CLINICAL DATA:  Right facial weakness. EXAM: CT HEAD WITHOUT CONTRAST TECHNIQUE: Contiguous axial images were obtained from the base of the skull through the vertex without intravenous contrast. RADIATION DOSE REDUCTION: This exam was performed according to the departmental dose-optimization program which includes automated exposure control, adjustment of the mA and/or kV according to patient size and/or use of iterative reconstruction technique. COMPARISON:  Head CT 07/21/2022 FINDINGS: Brain: There are new small hypodensities in the left frontoparietal white matter at the level of the posterior centrum semiovale (series 3, image 20), and there is a new subcentimeter focus of cortical hypodensity at the level of the left parietal operculum (series 6, image 46), suspicious for recent infarcts. No intracranial hemorrhage, mass, midline shift, or extra-axial fluid collection is identified. Cerebral volume is normal. The ventricles are normal in size. Vascular: No hyperdense vessel. Skull: No acute fracture or suspicious lesion. Sinuses/Orbits: Visualized paranasal sinuses and mastoid air cells are clear. Bilateral cataract extraction. Other: None. IMPRESSION: Suspected recent small infarcts involving  left parietal cortex and white matter. A brain MRI is in progress and will provide further evaluation. Electronically Signed   By: Sebastian Ache M.D.   On: 11/11/2023 15:13   DG Chest 2 View Result Date: 11/10/2023 CLINICAL DATA:  dyspnea EXAM: CHEST - 2 VIEW COMPARISON:  Chest x-ray 07/21/2022, chest x-ray 02/15/2021 FINDINGS: The  heart and mediastinal contours are unchanged. Prominent hilar vasculature. No focal consolidation. No pulmonary edema. No pleural effusion. No pneumothorax. No acute osseous abnormality. IMPRESSION: Prominent hilar vasculature suggestive of pulmonary venous congestion. Underlying lymph nodes not excluded. Electronically Signed   By: Tish Frederickson M.D.   On: 11/10/2023 23:50   CT ABDOMEN PELVIS W CONTRAST Result Date: 11/10/2023 CLINICAL DATA:  Acute nonlocalized abdominal pain. Vomiting and diarrhea. EXAM: CT ABDOMEN AND PELVIS WITH CONTRAST TECHNIQUE: Multidetector CT imaging of the abdomen and pelvis was performed using the standard protocol following bolus administration of intravenous contrast. RADIATION DOSE REDUCTION: This exam was performed according to the departmental dose-optimization program which includes automated exposure control, adjustment of the mA and/or kV according to patient size and/or use of iterative reconstruction technique. CONTRAST:  OMNIPAQUE IOHEXOL 300 MG/ML  SOLN COMPARISON:  None Available. FINDINGS: Lower chest: Focal consolidation suggested in the right lung base with small right pleural effusion. This may represent pneumonia or less likely aspiration. Emphysematous changes in the lungs. Hepatobiliary: Multiple scattered low-attenuation lesions in the liver, most are subcentimeter in size. Largest is in the medial segment left lobe measuring 1.3 cm in diameter. Enhancement pattern is indeterminate. Consider follow-up MRI for further characterization. The gallbladder is not visualized, likely surgically absent. Mild intrahepatic bile duct dilatation is most likely due to postoperative change. No common duct stones are identified. Pancreas: Unremarkable. No pancreatic ductal dilatation or surrounding inflammatory changes. Spleen: Segmental low-attenuation in the upper spleen likely represents a splenic infarct. Spleen size is normal. Adrenals/Urinary Tract: 1.3 cm diameter  left adrenal gland nodule with density measurement of 69 Hounsfield units. Renal nephrograms are symmetrical. No solid mass identified. No hydronephrosis or hydroureter. Bladder is normal. Stomach/Bowel: Stomach is within normal limits. Appendix appears normal. No evidence of bowel wall thickening, distention, or inflammatory changes. Vascular/Lymphatic: Aortic atherosclerosis. No enlarged abdominal or pelvic lymph nodes. Reproductive: Uterus and bilateral adnexa are unremarkable. Other: No abdominal wall hernia or abnormality. No abdominopelvic ascites. Musculoskeletal: Postoperative fixation of the right hip. No acute bony abnormalities. IMPRESSION: 1. Nonspecific low-attenuation lesions in the liver, largest measuring 1.3 cm diameter. Enhancement pattern is indeterminate. Consider follow-up MRI for further characterization. 2. Mild intrahepatic bile duct dilatation is likely postoperative in the setting of cholecystectomy. 3. Left adrenal mass measuring 1.3 cm, probable benign adenoma. Recommend follow-up adrenal washout CT in 1 year. If stable for = 1 year, no further follow-up imaging. JACR 2017 Aug; 14(8):1038-44, JCAT 2016 Mar-Apr; 40(2):194-200, Urol J 2006 Spring; 3(2):71-4. 4. Aortic atherosclerosis. 5. Focal area of consolidation suggested in the right lung base with small right pleural effusion, likely pneumonia. Electronically Signed   By: Burman Nieves M.D.   On: 11/10/2023 23:30    Microbiology: Results for orders placed or performed during the hospital encounter of 11/10/23  Resp panel by RT-PCR (RSV, Flu A&B, Covid) Anterior Nasal Swab     Status: None   Collection Time: 11/10/23 11:05 PM   Specimen: Anterior Nasal Swab  Result Value Ref Range Status   SARS Coronavirus 2 by RT PCR NEGATIVE NEGATIVE Final    Comment: (NOTE) SARS-CoV-2 target nucleic acids are  NOT DETECTED.  The SARS-CoV-2 RNA is generally detectable in upper respiratory specimens during the acute phase of infection.  The lowest concentration of SARS-CoV-2 viral copies this assay can detect is 138 copies/mL. A negative result does not preclude SARS-Cov-2 infection and should not be used as the sole basis for treatment or other patient management decisions. A negative result may occur with  improper specimen collection/handling, submission of specimen other than nasopharyngeal swab, presence of viral mutation(s) within the areas targeted by this assay, and inadequate number of viral copies(<138 copies/mL). A negative result must be combined with clinical observations, patient history, and epidemiological information. The expected result is Negative.  Fact Sheet for Patients:  BloggerCourse.com  Fact Sheet for Healthcare Providers:  SeriousBroker.it  This test is no t yet approved or cleared by the [place] FDA and  has been authorized for detection and/or diagnosis of SARS-CoV-2 by FDA under an Emergency Use Authorization (EUA). This EUA will remain  in effect (meaning this test can be used) for the duration of the COVID-19 declaration under Section 564(b)(1) of the Act, 21 U.S.C.section 360bbb-3(b)(1), unless the authorization is terminated  or revoked sooner.       Influenza A by PCR NEGATIVE NEGATIVE Final   Influenza B by PCR NEGATIVE NEGATIVE Final    Comment: (NOTE) The Xpert Xpress SARS-CoV-2/FLU/RSV plus assay is intended as an aid in the diagnosis of influenza from Nasopharyngeal swab specimens and should not be used as a sole basis for treatment. Nasal washings and aspirates are unacceptable for Xpert Xpress SARS-CoV-2/FLU/RSV testing.  Fact Sheet for Patients: BloggerCourse.com  Fact Sheet for Healthcare Providers: SeriousBroker.it  This test is not yet approved or cleared by the [place] FDA and has been authorized for detection and/or diagnosis of SARS-CoV-2 by FDA  under an Emergency Use Authorization (EUA). This EUA will remain in effect (meaning this test can be used) for the duration of the COVID-19 declaration under Section 564(b)(1) of the Act, 21 U.S.C. section 360bbb-3(b)(1), unless the authorization is terminated or revoked.     Resp Syncytial Virus by PCR NEGATIVE NEGATIVE Final    Comment: (NOTE) Fact Sheet for Patients: BloggerCourse.com  Fact Sheet for Healthcare Providers: SeriousBroker.it  This test is not yet approved or cleared by the [place] FDA and has been authorized for detection and/or diagnosis of SARS-CoV-2 by FDA under an Emergency Use Authorization (EUA). This EUA will remain in effect (meaning this test can be used) for the duration of the COVID-19 declaration under Section 564(b)(1) of the Act, 21 U.S.C. section 360bbb-3(b)(1), unless the authorization is terminated or revoked.  Performed at [facility], [address] [place], [place] [address]   Blood culture (routine x 2)     Status: None   Collection Time: 11/11/23 12:10 AM   Specimen: BLOOD  Result Value Ref Range Status   Specimen Description BLOOD LEFT ANTECUBITAL  Final   Special Requests   Final    BOTTLES DRAWN AEROBIC AND ANAEROBIC Blood Culture adequate volume   Culture   Final    NO GROWTH 5 DAYS Performed at [facility], [address], [place], [place] [address]    Report Status 11/16/2023 FINAL  Final  Blood culture (routine x 2)     Status: None   Collection Time: 11/11/23 12:39 AM   Specimen: BLOOD  Result Value Ref Range Status   Specimen Description BLOOD BLOOD RIGHT HAND  Final   Special Requests   Final  BOTTLES DRAWN AEROBIC AND ANAEROBIC Blood Culture adequate volume   Culture   Final    NO GROWTH 5 DAYS Performed at [facility], [address] [place] [place], [place] [address]    Report Status 11/16/2023 FINAL  Final  MRSA Next Gen by PCR,  Nasal     Status: None   Collection Time: 11/12/23  1:40 AM   Specimen: Nasal Mucosa; Nasal Swab  Result Value Ref Range Status   MRSA by PCR Next Gen NOT DETECTED NOT DETECTED Final    Comment: (NOTE) The GeneXpert MRSA Assay (FDA approved for NASAL specimens only), is one component of a comprehensive MRSA colonization surveillance program. It is not intended to diagnose MRSA infection nor to guide or monitor treatment for MRSA infections. Test performance is not FDA approved in patients less than 3 years old. Performed at [facility], [address] [place], [place] [address]     Labs: CBC: Recent Labs  Lab 11/12/23 0555 11/13/23 0157 11/14/23 0430 11/15/23 0541 11/16/23 0341  WBC 9.4 9.3 10.4 11.0* 11.0*  HGB 8.7* 9.1* 8.2* 8.1* 7.9*  HCT 25.7* 26.7* 24.3* 24.3* 23.2*  MCV 89.5 89.6 90.0 89.3 89.6  PLT 202 231 259 266 246   Basic Metabolic Panel: Recent Labs  Lab 11/10/23 2019 11/11/23 0041 11/12/23 0555 11/13/23 0157  NA 134* 134* 137 142  K 3.5 3.7 3.3* 3.7  CL 95* 100 103 108  CO2 25 24 26 27   GLUCOSE 97 85 96 110*  BUN 14 13 9 9   CREATININE 0.79 0.77 0.93 0.81  CALCIUM 9.6 8.6* 8.5* 8.7*   Liver Function Tests: Recent Labs  Lab 11/10/23 2019 11/11/23 0510 11/12/23 0555 11/13/23 0157  AST 112* 85* 74* 68*  ALT 171* 132* 120* 113*  ALKPHOS 59 49 46 49  BILITOT 1.3* 1.0 0.7 0.6  PROT 7.7 5.9* 6.0* 6.2*  ALBUMIN 3.6 2.8* 2.8* 2.9*   CBG: Recent Labs  Lab 11/12/23 0045  GLUCAP 94    Discharge time spent: greater than 30 minutes.  Signed: Enedina Finner, MD [facility] 11/16/2023

## 2023-11-16 NOTE — Procedures (Signed)
 Interventional Radiology Procedure Note  Procedure: US Guided Biopsy of right supraclavicular lymph node  Complications: None  Estimated Blood Loss: < 10 mL  Findings: 18 G core biopsy of right supraclavicular lymph node performed under US guidance.  Five core samples obtained and sent to Pathology.  Jodi Marble. Fredia Sorrow, M.D Pager:  [phone]

## 2023-11-16 NOTE — Discharge Instructions (Signed)
 Resume your Eliquis tonite

## 2023-11-16 NOTE — Progress Notes (Signed)
 [facility]  - [place] [facility]   PATIENT NAME: Debra [name]    MR#:  [id number]  DATE OF BIRTH:  [birth year]  SUBJECTIVE:  Husband at bedside  seen earlier in the day.  No new complaints  IV heparin drip on hold for liver biopsy today    VITALS:  Blood pressure 109/62, pulse 83, temperature 98.5 F (36.9 C), resp. rate 20, height 5\' 4"  (1.626 m), weight 81.6 kg, SpO2 98%.  PHYSICAL EXAMINATION:   GENERAL:  68 y.o.-year-old patient with no acute distress. Obese LUNGS: Normal breath sounds bilaterally, no wheezing CARDIOVASCULAR: S1, S2 normal. No murmur   ABDOMEN: Soft, nontender, nondistended.   EXTREMITIES: No  edema b/l.    NEUROLOGIC: nonfocal  patient is alert and awake, mild facial drool    LABORATORY PANEL:  CBC Recent Labs  Lab 11/16/23 0341  WBC 11.0*  HGB 7.9*  HCT 23.2*  PLT 246    Chemistries  Recent Labs  Lab 11/13/23 0157  NA 142  K 3.7  CL 108  CO2 27  GLUCOSE 110*  BUN 9  CREATININE 0.81  CALCIUM 8.7*  AST 68*  ALT 113*  ALKPHOS 49  BILITOT 0.6   Cardiac Enzymes No results for input(s): "TROPONINI" in the last 168 hours. RADIOLOGY:  No results found.   Assessment and Plan CHANDREA ZELLMAN is a 68 y.o. Caucasian female with medical history significant for paroxysmal atrial fibrillation, anxiety, COPD, on home O2 at 3 L/min, diastolic function, GERD, and breast cancer, not currently on chemotherapy, hypertension and dyslipidemia, who presented to the emergency room with acute onset of recurrent nausea and vomiting over the last week and a half.  She has not had any solid food for the past week.  She has been having significant anorexia secondarily.  She denied any bilious vomitus or hematemesis.    Nausea/vomiting/diarrhea Transaminitis with new liver lesions history of breast cancer -- No signs obstruction on CT. Resolved. Likely 2/2 metastatic disease.  --Transaminitis improving, likely 2/2 liver lesions. Hepatitis  panel neg.  --GI consulted, has signed off. No diarrhea here so no stool testing has been performed -- patient will get biopsy from liver lesions on Monday by IR -- oncology (Dr. Cathie Hoops) input appreciated   Acute stroke, embolic --Incidental note of right sided facial weakness yesterday.  --MRI shows multiple small acute infarcts posterior left MCA distribution, left cerebellum. Hypercoagulable state from malignancy? Cardiac embolic? LDL 131 - neurology recommends continue IV heparin drip till biopsy is over and switch back to eliquis. Oncology does not feel this is eliquis failure. Patient had hard time absorbing it due to nausea vomiting and diarrhea. - PT/OT-- recommends rehab -- patents given elevated liver enzymes    Right ICA outpouching On CTA --Dr. Katrinka Blazing of neurosurgery -- recommends outpatient follow-up     Normocytic anemia --Hgb 8.7 today from 10-11s previously, no report of bleeding, suspect at least partially dilutional     COPD/Chronic hypoxic respiratory failure/chronic home oxygen Stable, at baseline.   --breo/incruse for home trelegy --home o2, stable on home dose   GAD --home celexa    a-fib, chronic --home apixaban-- currently on IV heparin drip till liver biopsy completed on Monday --cont home dilt    chronic pain --home pregabalin, oxy   History breast cancer See above - home tamoxifen on hold   Procedures: Family communication : husband at bedside Consults : neurology, oncology CODE STATUS: DNR DVT Prophylaxis : heparin drip Level of care:  Progressive Status is: Inpatient Remains inpatient appropriate because: scheduled patient for liver biopsy today  TOTAL TIME TAKING CARE OF THIS PATIENT: 35 minutes.  >50% time spent on counselling and coordination of care  Note: This dictation was prepared with Dragon dictation along with smaller phrase technology. Any transcriptional errors that result from this process are unintentional.  Enedina Finner M.D     [facility]   CC: Primary care physician; Barbette Reichmann, MD

## 2023-11-16 NOTE — Progress Notes (Signed)
 Speech Language Pathology Treatment: Dysphagia  Patient Details Name: Debra [name] MRN: [id number] DOB: [birth year] Today's Date: 11/16/2023 Time: 1500-1530 SLP Time Calculation (min) (ACUTE ONLY): 30 min  Assessment / Plan / Recommendation Clinical Impression  Pt seen for f/u for toleration of diet s/p new Neurological change. Pt awake, sitting up in bed. Pt was alert, talkative. Quite pleasant. Pt and Husband reported surprise w/ the MRI results. Pt endorsed N/V at home prior to admit. Husband present. Both pt and Husband denied pt having any difficulty swallowing; just the recent N/V.   OF NOTE: a slight R labial weakness/decreased tone was noted yesterday during BSE which prompted SLP to alert MD, then MRI was ordered which revealed: "Many small acute infarcts in the posterior left MCA distribution including the left frontal and parietal lobes  2. Probable additional punctate acute infarct in the inferior left cerebellum.", per 11/11/2023 MRI.       Pt appears to present w/ functional oropharyngeal phase swallowing w/ No overt oropharyngeal phase dysphagia noted, No neuromuscular swallowing deficits noted. Pt only wears an Upper Denture plate; has no Bottom Dentition.  Pt consumed po trials w/ No overt, clinical s/s of aspiration during po trials. Pt appears at reduced risk for aspiration when following general aspiration precautions. However, pt does have challenging factors that could impact her oropharyngeal swallowing to include deconditioning/fatigue/weakness, recent GI discomfort and N/V, and Cancer comorbidities. 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 These factors can increase risk for aspiration, dysphagia as well as decreased oral intake overall.    During po trials of thin liquids, pt consumed trials w/ no overt coughing, decline in vocal quality, or change in respiratory presentation during/post trials. No decline in O2 sats, 99%. Oral phase appeared [facility] w/ timely  bolus management and control of bolus propulsion for A-P transfer for swallowing. Oral clearing achieved. No anterior leakage noted. Pt fed self. OM Exam appeared [facility] for lingual strength/ROM but noted slight R lingual asymmetry/deviation and mild R Labial decreased tone/ROM. Speech was intelligible but slightly dysarthric(pt has No Lower Dentition which can impact speech articulation).    Recommend continue a more mech soft consistency diet w/ well-Cut/Chopped meats, moistened foods; Thin liquids -- choosing foods of preference d/t GI discomfort recently. Pt should Hold Cup when drinking. Recommend general aspiration precautions of Small bites/sips Slowly and reducing distractions/talking during meals. Sitting EOB or in chair best for eating/drinking. Pills 1-2 at a time w/ Water; can consider WHOLE in Puree for ease of swallowing.    Education given on Pills in Puree if needed; food consistencies, preferences, and easy to eat options; general aspiration precautions to pt and Husband. No acute swallowing needs at this time. NSG/MD to reconsult if any new needs arise. MD/NSG updated, agreed. Recommend Dietician f/u for support.     HPI HPI: Pt is a 68 y.o. Caucasian female with medical history significant for paroxysmal atrial fibrillation, anxiety, COPD, on home O2 at 3 L/min, diastolic function, GERD, and breast cancer, not currently on chemotherapy, hypertension and dyslipidemia, who presented to the emergency room with acute onset of recurrent nausea and vomiting over the last week and a half.  She has not had any solid food for the past week.  She denied any bilious vomitus or hematemesis.  She has been having intermittent abdominal pain with generalized bodyaches. She noted recent cough that has been dry without significant dyspnea or wheezing.  GI following today noting: "history of breast cancer  with new liver lesions found.  The patient also had dilation of the common bile duct that was reported to  be consistent with postcholecystectomy dilation. No endoscopic intervention at this time since the patient is not having any further nausea or vomiting.  Obviously the concern with the new liver lesion is a metastatic process from a history of breast cancer.  The patient and the husband have been explained by GI recommendations and agree with the plan.".   CXR: Prominent hilar vasculature suggestive of pulmonary venous  congestion.   Pt and Family denied any swallowing problems at home.    MRI on 11/11/23: Many small acute infarcts in the posterior left MCA distribution  including the left frontal and parietal lobes  2. Probable additional punctate acute infarct in the inferior left  cerebellum.      SLP Plan  All goals met  All further Speech Lanaguage Pathology  needs can be addressed in the next venue of care   Recommendations for follow up therapy are one component of a multi-disciplinary discharge planning process, led by the attending physician.  Recommendations may be updated based on patient status, additional functional criteria and insurance authorization.    Recommendations  Diet recommendations: Regular;Thin liquid Liquids provided via: Cup;Straw (monitor) Medication Administration: Whole meds with liquid (vs whole in Puree if needed) Supervision: Patient able to self feed Compensations: Minimize environmental distractions;Slow rate;Small sips/bites Postural Changes and/or Swallow Maneuvers: Out of bed for meals;Seated upright 90 degrees;Upright 30-60 min after meal                 (Dietician f/u) Oral care BID;Patient independent with oral care   PRN Dysphagia, unspecified (R13.10) (R labial weakness - new neurological change)     All goals met       Jerilynn Som, MS, CCC-SLP Speech Language Pathologist Rehab Services; [facility] [phone] (ascom) Yuridiana Formanek  11/16/2023, 4:56 PM

## 2023-11-16 NOTE — Progress Notes (Signed)
 This RN provided discharge instructions and teaching to the patient and the patient's significant other. Both parties verbalized and demonstrated understanding of the provided instructions. All outstanding questions resolved. Midline removed per order. Catheter intact. Pt tolerated well.

## 2023-11-16 NOTE — Progress Notes (Signed)
 PHARMACY - ANTICOAGULATION CONSULT NOTE  Pharmacy Consult for heparin drip Indication: atrial fibrillation   (on apixaban, last dose 3/27 @ 0807)  Allergies  Allergen Reactions   Cephalexin Hives   Strawberry Extract Hives    Patient Measurements: Height: 5\' 4"  (162.6 cm) Weight: 81.6 kg (180 lb) IBW/kg (Calculated) : 54.7 HEPARIN DW (KG): 72.4  Vital Signs: Temp: 98.7 F (37.1 C) (03/30 2313) Temp Source: Oral (03/30 2313) BP: 105/49 (03/30 2313) Pulse Rate: 90 (03/30 2313)  Labs: Recent Labs    11/14/23 0430 11/15/23 0541 11/16/23 0341  HGB 8.2* 8.1* 7.9*  HCT 24.3* 24.3* 23.2*  PLT 259 266 246  APTT 93* 78* 65*  LABPROT  --   --  14.1  INR  --   --  1.1  HEPARINUNFRC >1.10* 0.93* 0.58    Estimated Creatinine Clearance: 67.8 mL/min (by C-G formula based on SCr of 0.81 mg/dL).   Medical History: Past Medical History:  Diagnosis Date   Anxiety    Aortic atherosclerosis (HCC)    Arthritis    Atrial fibrillation (HCC) 08/01/2020   a.) CHA2DS2-VASc = 4 (age, sex, HTN, aortic plaque). b.) chronically anticoagulated using apixaban   Breast cancer, right breast (HCC) 01/08/2021   a.) Stage IB (cT2cN0cM0) invasive mammary carcinoma of the RIGHT breast; grade I, ER/PR (+) and HER2/neu (+). Tx with neoadjuvant TCHP chemotherapy.   Carotid bruit    L --nl doppler 5/09- and again 5/13 with 0-39% stenosis bilat   Constipation    COPD (chronic obstructive pulmonary disease) (HCC)    Diastolic dysfunction 08/02/2020   a.) TTE 08/02/2020: EF 60-65%; G1DD; triv MR/AR. b.) TTE 02/05/2021: ED 55-60%; G1DD; GLS -19.0%. c.) TTE 05/07/2021: TTE 55-60%; GLS -20.3%.   Family history of brain cancer    Family history of breast cancer    Family history of kidney cancer    Family history of lung cancer    Fatigue    Fracture of femoral neck, right (HCC) 2015   GERD (gastroesophageal reflux disease)    GI (gastrointestinal bleed)    [name]   Hepatitis    Hyperlipidemia     Hypertension    Left arm pain    Leg pain    Chronic pain R leg from injury   Long term current use of anticoagulant    a.) apixaban   OSA and COPD overlap syndrome (HCC)    a.) no nocurnal PAP therapy; does utilize supplemental oxygen.   Osteopenia    Other organic sleep disorders    Supplemental oxygen dependent    Tobacco abuse     Medications:  Medications Prior to Admission  Medication Sig Dispense Refill Last Dose/Taking   acetaminophen (TYLENOL) 500 MG tablet Take 1,500 mg by mouth 2 (two) times daily as needed for moderate pain.      acidophilus (RISAQUAD) CAPS capsule Take 1 capsule by mouth daily. 10 capsule 0    albuterol (PROVENTIL) (2.5 MG/3ML) 0.083% nebulizer solution Take 2.5 mg by nebulization every 4 (four) hours as needed for wheezing or shortness of breath.       albuterol (VENTOLIN HFA) 108 (90 Base) MCG/ACT inhaler Inhale 2 puffs into the lungs every 6 (six) hours as needed for shortness of breath or wheezing.      alendronate (FOSAMAX) 70 MG tablet Take 70 mg by mouth every Sunday.      apixaban (ELIQUIS) 5 MG TABS tablet Take 1 tablet (5 mg total) by mouth 2 (two) times daily. 180  tablet 1    citalopram (CELEXA) 10 MG tablet Take 10 mg by mouth daily.      Cyanocobalamin (B-12) 1000 MCG CAPS Take 1,000 mcg by mouth daily.      diltiazem (CARDIZEM CD) 120 MG 24 hr capsule Take 1 capsule (120 mg total) by mouth daily. 90 capsule 3    GAVILAX 17 GM/SCOOP powder SMARTSIG:17 Gram(s) By Mouth Daily PRN      lidocaine-prilocaine (EMLA) cream APPLY 1 APPLICATION TOPICALLY AS NEEDED. 30 g 6    Melatonin 5 MG CAPS Take 15 mg by mouth at bedtime.      naloxone (NARCAN) nasal spray 4 mg/0.1 mL Place 1 spray into the nose as needed (opioid overdose). (Patient not taking: Reported on 06/24/2023)      ondansetron (ZOFRAN) 8 MG tablet Take 1 tablet (8 mg total) by mouth every 8 (eight) hours as needed for nausea (Nausea or vomiting). Start on the third day after chemotherapy. 90  tablet 0    oxyCODONE (OXY IR/ROXICODONE) 5 MG immediate release tablet Take 5 mg by mouth every 6 (six) hours as needed.      pregabalin (LYRICA) 150 MG capsule Take 1 capsule (150 mg total) by mouth 2 (two) times daily. 90 capsule 0    roflumilast (DALIRESP) 500 MCG TABS tablet Take 1 tablet by mouth daily.      tamoxifen (NOLVADEX) 20 MG tablet Take 1 tablet (20 mg total) by mouth daily. 90 tablet 1    tiotropium (SPIRIVA) 18 MCG inhalation capsule Place 18 mcg into inhaler and inhale daily.      TRELEGY ELLIPTA 100-62.5-25 MCG/INH AEPB Inhale 1 puff into the lungs daily.      Scheduled:   acidophilus  1 capsule Oral Daily   atorvastatin  80 mg Oral Daily   citalopram  10 mg Oral Daily   cyanocobalamin  1,000 mcg Oral Daily   diltiazem  120 mg Oral Daily   feeding supplement  237 mL Oral TID BM   fluticasone furoate-vilanterol  1 puff Inhalation Daily   And   umeclidinium bromide  1 puff Inhalation Daily   guaiFENesin  600 mg Oral BID   heparin  1,100 Units Intravenous Once   pantoprazole  40 mg Oral Daily   pregabalin  150 mg Oral BID   roflumilast  500 mcg Oral Daily   senna-docusate  1 tablet Oral BID   sodium chloride flush  10-40 mL Intracatheter Q12H   sodium chloride flush  3-10 mL Intravenous Q12H   Infusions:   heparin 1,250 Units/hr (11/16/23 0344)    Assessment: 68 yo F on apixaban for Afib now being transitioned to heparin drip (anticipate liver lesion biopsy per Oncology note).  Last dose of apixaban 5 mg given 3/27 @ 0807.   3/28 0157 aPTT 56 3/28 0853 aPTT 90 Heparin level > 1.1 3/28 1422 aPTT 87, therapeutic x 2 3/29 0430 aPTT 93, therapeutic X 3, HL > 1.10 3/30 0541 aPTT 78, therapeutic X 4, HL = 0.93 3/31 0341 aPTT 65, SUBtherapeutic   Goal of Therapy:  Heparin level 0.3-0.7 units/ml once aPTT and heparin level correlated.  aPTT 66-102 seconds Monitor platelets by anticoagulation protocol: Yes   Plan:  3/31 @ 0341:  aPTT = 65,  HL = 0.58 - aPTT  SUBtherapeutic, not correlating with HL  - will increase drip rate to 1400 units/hr, avoid bolus due to CVA.  - will recheck aPTT 6 hrs after rate change  Recheck HL on  04/01 with AM labs  Daily Heparin level and CBC while on heparin.   Scherrie Gerlach, PharmD Clinical Pharmacist 11/16/2023

## 2023-11-17 ENCOUNTER — Observation Stay (HOSPITAL_BASED_OUTPATIENT_CLINIC_OR_DEPARTMENT_OTHER)
Admit: 2023-11-17 | Discharge: 2023-11-17 | Disposition: A | Discharge status: Home / Self Care | Attending: Physician Assistant | Admitting: Physician Assistant

## 2023-11-17 ENCOUNTER — Inpatient Hospital Stay
Admission: EM | Admit: 2023-11-17 | Discharge: 2023-11-20 | DRG: 041 | Disposition: A | Discharge status: Home / Self Care | Attending: Internal Medicine | Admitting: Internal Medicine

## 2023-11-17 ENCOUNTER — Other Ambulatory Visit: Payer: Self-pay

## 2023-11-17 ENCOUNTER — Emergency Department

## 2023-11-17 ENCOUNTER — Observation Stay

## 2023-11-17 DIAGNOSIS — R7989 Other specified abnormal findings of blood chemistry: Secondary | ICD-10-CM

## 2023-11-17 DIAGNOSIS — J449 Chronic obstructive pulmonary disease, unspecified: Secondary | ICD-10-CM | POA: Diagnosis not present

## 2023-11-17 DIAGNOSIS — I482 Chronic atrial fibrillation, unspecified: Secondary | ICD-10-CM | POA: Diagnosis present

## 2023-11-17 DIAGNOSIS — C770 Secondary and unspecified malignant neoplasm of lymph nodes of head, face and neck: Secondary | ICD-10-CM | POA: Diagnosis present

## 2023-11-17 DIAGNOSIS — I214 Non-ST elevation (NSTEMI) myocardial infarction: Secondary | ICD-10-CM

## 2023-11-17 DIAGNOSIS — I63442 Cerebral infarction due to embolism of left cerebellar artery: Principal | ICD-10-CM | POA: Diagnosis present

## 2023-11-17 DIAGNOSIS — I6389 Other cerebral infarction: Secondary | ICD-10-CM | POA: Diagnosis not present

## 2023-11-17 DIAGNOSIS — Z79899 Other long term (current) drug therapy: Secondary | ICD-10-CM

## 2023-11-17 DIAGNOSIS — Z7901 Long term (current) use of anticoagulants: Secondary | ICD-10-CM

## 2023-11-17 DIAGNOSIS — Z825 Family history of asthma and other chronic lower respiratory diseases: Secondary | ICD-10-CM

## 2023-11-17 DIAGNOSIS — I493 Ventricular premature depolarization: Secondary | ICD-10-CM | POA: Diagnosis present

## 2023-11-17 DIAGNOSIS — Z9049 Acquired absence of other specified parts of digestive tract: Secondary | ICD-10-CM

## 2023-11-17 DIAGNOSIS — C50911 Malignant neoplasm of unspecified site of right female breast: Secondary | ICD-10-CM | POA: Diagnosis present

## 2023-11-17 DIAGNOSIS — Z923 Personal history of irradiation: Secondary | ICD-10-CM

## 2023-11-17 DIAGNOSIS — Z853 Personal history of malignant neoplasm of breast: Secondary | ICD-10-CM

## 2023-11-17 DIAGNOSIS — R471 Dysarthria and anarthria: Secondary | ICD-10-CM | POA: Diagnosis present

## 2023-11-17 DIAGNOSIS — D630 Anemia in neoplastic disease: Secondary | ICD-10-CM | POA: Diagnosis present

## 2023-11-17 DIAGNOSIS — I2489 Other forms of acute ischemic heart disease: Secondary | ICD-10-CM | POA: Diagnosis present

## 2023-11-17 DIAGNOSIS — E785 Hyperlipidemia, unspecified: Secondary | ICD-10-CM | POA: Diagnosis present

## 2023-11-17 DIAGNOSIS — Z8051 Family history of malignant neoplasm of kidney: Secondary | ICD-10-CM

## 2023-11-17 DIAGNOSIS — Z87891 Personal history of nicotine dependence: Secondary | ICD-10-CM

## 2023-11-17 DIAGNOSIS — Z801 Family history of malignant neoplasm of trachea, bronchus and lung: Secondary | ICD-10-CM

## 2023-11-17 DIAGNOSIS — R197 Diarrhea, unspecified: Secondary | ICD-10-CM | POA: Diagnosis present

## 2023-11-17 DIAGNOSIS — R110 Nausea: Secondary | ICD-10-CM

## 2023-11-17 DIAGNOSIS — C50919 Malignant neoplasm of unspecified site of unspecified female breast: Secondary | ICD-10-CM | POA: Diagnosis not present

## 2023-11-17 DIAGNOSIS — Z8701 Personal history of pneumonia (recurrent): Secondary | ICD-10-CM

## 2023-11-17 DIAGNOSIS — M8580 Other specified disorders of bone density and structure, unspecified site: Secondary | ICD-10-CM | POA: Diagnosis present

## 2023-11-17 DIAGNOSIS — Z8673 Personal history of transient ischemic attack (TIA), and cerebral infarction without residual deficits: Secondary | ICD-10-CM | POA: Diagnosis not present

## 2023-11-17 DIAGNOSIS — G4733 Obstructive sleep apnea (adult) (pediatric): Secondary | ICD-10-CM | POA: Diagnosis present

## 2023-11-17 DIAGNOSIS — R29702 NIHSS score 2: Secondary | ICD-10-CM | POA: Diagnosis present

## 2023-11-17 DIAGNOSIS — I63412 Cerebral infarction due to embolism of left middle cerebral artery: Secondary | ICD-10-CM | POA: Diagnosis present

## 2023-11-17 DIAGNOSIS — K219 Gastro-esophageal reflux disease without esophagitis: Secondary | ICD-10-CM | POA: Diagnosis present

## 2023-11-17 DIAGNOSIS — I480 Paroxysmal atrial fibrillation: Secondary | ICD-10-CM | POA: Diagnosis present

## 2023-11-17 DIAGNOSIS — Z881 Allergy status to other antibiotic agents status: Secondary | ICD-10-CM

## 2023-11-17 DIAGNOSIS — J9611 Chronic respiratory failure with hypoxia: Secondary | ICD-10-CM

## 2023-11-17 DIAGNOSIS — I639 Cerebral infarction, unspecified: Secondary | ICD-10-CM | POA: Diagnosis not present

## 2023-11-17 DIAGNOSIS — Z823 Family history of stroke: Secondary | ICD-10-CM

## 2023-11-17 DIAGNOSIS — Z818 Family history of other mental and behavioral disorders: Secondary | ICD-10-CM

## 2023-11-17 DIAGNOSIS — Z79810 Long term (current) use of selective estrogen receptor modulators (SERMs): Secondary | ICD-10-CM

## 2023-11-17 DIAGNOSIS — I340 Nonrheumatic mitral (valve) insufficiency: Secondary | ICD-10-CM | POA: Diagnosis present

## 2023-11-17 DIAGNOSIS — R29810 Facial weakness: Secondary | ICD-10-CM | POA: Diagnosis not present

## 2023-11-17 DIAGNOSIS — Z9221 Personal history of antineoplastic chemotherapy: Secondary | ICD-10-CM

## 2023-11-17 DIAGNOSIS — J4489 Other specified chronic obstructive pulmonary disease: Secondary | ICD-10-CM | POA: Diagnosis present

## 2023-11-17 DIAGNOSIS — Z9981 Dependence on supplemental oxygen: Secondary | ICD-10-CM

## 2023-11-17 DIAGNOSIS — Z808 Family history of malignant neoplasm of other organs or systems: Secondary | ICD-10-CM

## 2023-11-17 DIAGNOSIS — Z803 Family history of malignant neoplasm of breast: Secondary | ICD-10-CM

## 2023-11-17 DIAGNOSIS — Z9011 Acquired absence of right breast and nipple: Secondary | ICD-10-CM

## 2023-11-17 DIAGNOSIS — R4701 Aphasia: Secondary | ICD-10-CM | POA: Diagnosis present

## 2023-11-17 DIAGNOSIS — Z8249 Family history of ischemic heart disease and other diseases of the circulatory system: Secondary | ICD-10-CM

## 2023-11-17 DIAGNOSIS — I4891 Unspecified atrial fibrillation: Secondary | ICD-10-CM

## 2023-11-17 DIAGNOSIS — Z7951 Long term (current) use of inhaled steroids: Secondary | ICD-10-CM

## 2023-11-17 DIAGNOSIS — Z7983 Long term (current) use of bisphosphonates: Secondary | ICD-10-CM

## 2023-11-17 DIAGNOSIS — I10 Essential (primary) hypertension: Secondary | ICD-10-CM | POA: Diagnosis present

## 2023-11-17 DIAGNOSIS — R112 Nausea with vomiting, unspecified: Principal | ICD-10-CM | POA: Diagnosis present

## 2023-11-17 LAB — CBC WITH DIFFERENTIAL/PLATELET
Abs Immature Granulocytes: 0.08 10*3/uL — ABNORMAL HIGH (ref 0.00–0.07)
Basophils Absolute: 0.1 10*3/uL (ref 0.0–0.1)
Basophils Relative: 0 %
Eosinophils Absolute: 0.2 10*3/uL (ref 0.0–0.5)
Eosinophils Relative: 1 %
HCT: 27.7 % — ABNORMAL LOW (ref 36.0–46.0)
Hemoglobin: 9.2 g/dL — ABNORMAL LOW (ref 12.0–15.0)
Immature Granulocytes: 1 %
Lymphocytes Relative: 10 %
Lymphs Abs: 1.2 10*3/uL (ref 0.7–4.0)
MCH: 30.7 pg (ref 26.0–34.0)
MCHC: 33.2 g/dL (ref 30.0–36.0)
MCV: 92.3 fL (ref 80.0–100.0)
Monocytes Absolute: 0.7 10*3/uL (ref 0.1–1.0)
Monocytes Relative: 6 %
Neutro Abs: 9.5 10*3/uL — ABNORMAL HIGH (ref 1.7–7.7)
Neutrophils Relative %: 82 %
Platelets: 204 10*3/uL (ref 150–400)
RBC: 3 MIL/uL — ABNORMAL LOW (ref 3.87–5.11)
RDW: 14.5 % (ref 11.5–15.5)
WBC: 11.7 10*3/uL — ABNORMAL HIGH (ref 4.0–10.5)
nRBC: 0 % (ref 0.0–0.2)

## 2023-11-17 LAB — TROPONIN I (HIGH SENSITIVITY)
Troponin I (High Sensitivity): 481 ng/L (ref <18)
Troponin I (High Sensitivity): 579 ng/L (ref <18)
Troponin I (High Sensitivity): 791 ng/L (ref <18)

## 2023-11-17 LAB — COMPREHENSIVE METABOLIC PANEL WITH GFR
ALT: 67 U/L — ABNORMAL HIGH (ref 0–44)
AST: 42 U/L — ABNORMAL HIGH (ref 15–41)
Albumin: 3.2 g/dL — ABNORMAL LOW (ref 3.5–5.0)
Alkaline Phosphatase: 49 U/L (ref 38–126)
Anion gap: 8 (ref 5–15)
BUN: 15 mg/dL (ref 8–23)
CO2: 29 mmol/L (ref 22–32)
Calcium: 9 mg/dL (ref 8.9–10.3)
Chloride: 102 mmol/L (ref 98–111)
Creatinine, Ser: 0.9 mg/dL (ref 0.44–1.00)
GFR, Estimated: >60 mL/min (ref >60)
Glucose, Bld: 122 mg/dL — ABNORMAL HIGH (ref 70–99)
Potassium: 3.6 mmol/L (ref 3.5–5.1)
Sodium: 139 mmol/L (ref 135–145)
Total Bilirubin: 0.7 mg/dL (ref 0.0–1.2)
Total Protein: 6.9 g/dL (ref 6.5–8.1)

## 2023-11-17 LAB — PROTIME-INR
INR: 1.1 (ref 0.8–1.2)
Prothrombin Time: 14.7 s (ref 11.4–15.2)

## 2023-11-17 LAB — LIPASE, BLOOD: Lipase: 49 U/L (ref 11–51)

## 2023-11-17 LAB — HEPARIN LEVEL (UNFRACTIONATED): Heparin Unfractionated: 0.7 [IU]/mL (ref 0.30–0.70)

## 2023-11-17 LAB — APTT: aPTT: 39 s — ABNORMAL HIGH (ref 24–36)

## 2023-11-17 MED ORDER — OXYCODONE HCL 5 MG PO TABS
5.0000 mg | ORAL_TABLET | Freq: Four times a day (QID) | ORAL | Status: DC | PRN
  Administered 2023-11-17 – 2023-11-20 (×6): 5 mg via ORAL
  Filled 2023-11-17 (×6): qty 1

## 2023-11-17 MED ORDER — ATORVASTATIN CALCIUM 20 MG PO TABS: 80.0000 mg | ORAL_TABLET | Freq: Every day | ORAL | Status: DC

## 2023-11-17 MED ORDER — SODIUM CHLORIDE 0.9 % IV BOLUS
1000.0000 mL | Freq: Once | INTRAVENOUS | Status: AC
  Administered 2023-11-17: 1000 mL via INTRAVENOUS

## 2023-11-17 MED ORDER — ACETAMINOPHEN 325 MG PO TABS
650.0000 mg | ORAL_TABLET | Freq: Four times a day (QID) | ORAL | Status: DC | PRN
  Administered 2023-11-19 – 2023-11-20 (×3): 650 mg via ORAL
  Filled 2023-11-17 (×3): qty 2

## 2023-11-17 MED ORDER — DILTIAZEM HCL ER COATED BEADS 120 MG PO CP24: 120.0000 mg | ORAL_CAPSULE | Freq: Every day | ORAL | Status: DC

## 2023-11-17 MED ORDER — HEPARIN (PORCINE) 25000 UT/250ML-% IV SOLN
1300.0000 [IU]/h | INTRAVENOUS | Status: DC
Start: 2023-11-17 — End: 2023-11-18
  Administered 2023-11-17: 900 [IU]/h via INTRAVENOUS
  Filled 2023-11-17 (×2): qty 250

## 2023-11-17 MED ORDER — MELATONIN 5 MG PO TABS
10.0000 mg | ORAL_TABLET | Freq: Every day | ORAL | Status: DC
  Administered 2023-11-17 – 2023-11-19 (×3): 10 mg via ORAL
  Filled 2023-11-17 (×3): qty 2

## 2023-11-17 MED ORDER — ONDANSETRON HCL 4 MG/2ML IJ SOLN
4.0000 mg | Freq: Four times a day (QID) | INTRAMUSCULAR | Status: DC | PRN
  Administered 2023-11-19 (×2): 4 mg via INTRAVENOUS
  Filled 2023-11-17 (×2): qty 2

## 2023-11-17 MED ORDER — ACETAMINOPHEN 325 MG PO TABS
650.0000 mg | ORAL_TABLET | Freq: Four times a day (QID) | ORAL | Status: DC | PRN
  Administered 2023-11-17: 650 mg via ORAL
  Filled 2023-11-17: qty 2

## 2023-11-17 MED ORDER — ONDANSETRON HCL 4 MG/2ML IJ SOLN
4.0000 mg | Freq: Once | INTRAMUSCULAR | Status: AC
  Administered 2023-11-17: 4 mg via INTRAVENOUS
  Filled 2023-11-17: qty 2

## 2023-11-17 MED ORDER — ASPIRIN 325 MG PO TABS
325.0000 mg | ORAL_TABLET | Freq: Once | ORAL | Status: AC
  Administered 2023-11-17: 325 mg via ORAL
  Filled 2023-11-17: qty 1

## 2023-11-17 MED ORDER — HEPARIN BOLUS VIA INFUSION
4000.0000 [IU] | Freq: Once | INTRAVENOUS | Status: AC
  Administered 2023-11-17: 4000 [IU] via INTRAVENOUS
  Filled 2023-11-17: qty 4000

## 2023-11-17 MED ORDER — ONDANSETRON HCL 4 MG PO TABS: 4.0000 mg | ORAL_TABLET | Freq: Four times a day (QID) | ORAL | Status: DC | PRN

## 2023-11-17 MED ORDER — ALENDRONATE SODIUM 70 MG PO TABS
70.0000 mg | ORAL_TABLET | ORAL | Status: DC
Start: 2023-11-22 — End: 2023-11-17

## 2023-11-17 MED ORDER — PREGABALIN 50 MG PO CAPS: 150.0000 mg | ORAL_CAPSULE | Freq: Two times a day (BID) | ORAL | Status: DC

## 2023-11-17 MED ORDER — CITALOPRAM HYDROBROMIDE 20 MG PO TABS: 10.0000 mg | ORAL_TABLET | Freq: Every day | ORAL | Status: DC

## 2023-11-17 MED ORDER — HEPARIN BOLUS VIA INFUSION
2100.0000 [IU] | Freq: Once | INTRAVENOUS | Status: AC
Start: 2023-11-17 — End: 2023-11-17
  Administered 2023-11-17: 2100 [IU] via INTRAVENOUS
  Filled 2023-11-17: qty 2100

## 2023-11-17 NOTE — Consult Note (Signed)
 PHARMACY - ANTICOAGULATION CONSULT NOTE  Pharmacy Consult for Heparin Indication: chest pain/ACS and atrial fibrillation  Allergies  Allergen Reactions   Cephalexin Hives   Strawberry Extract Hives    Patient Measurements: Height: 5\' 4"  (162.6 cm) Weight: 81.6 kg (179 lb 14.3 oz) IBW/kg (Calculated) : 54.7 HEPARIN DW (KG): 72.3  Vital Signs: Temp: 98.6 F (37 C) (04/01 0909) BP: 112/61 (04/01 1030) Pulse Rate: 81 (04/01 1030)  Labs: Recent Labs    11/15/23 0541 11/16/23 0341 11/16/23 1053 11/17/23 0910  HGB 8.1* 7.9*  --  9.2*  HCT 24.3* 23.2*  --  27.7*  PLT 266 246  --  204  APTT 78* 65* 33  --   LABPROT  --  14.1  --   --   INR  --  1.1  --   --   HEPARINUNFRC 0.93* 0.58  --   --   CREATININE  --   --   --  0.90  TROPONINIHS  --   --   --  481*    Estimated Creatinine Clearance: 61 mL/min (by C-G formula based on SCr of 0.9 mg/dL).   Medical History: Past Medical History:  Diagnosis Date   Anxiety    Aortic atherosclerosis (HCC)    Arthritis    Atrial fibrillation (HCC) 08/01/2020   a.) CHA2DS2-VASc = 4 (age, sex, HTN, aortic plaque). b.) chronically anticoagulated using apixaban   Breast cancer, right breast (HCC) 01/08/2021   a.) Stage IB (cT2cN0cM0) invasive mammary carcinoma of the RIGHT breast; grade I, ER/PR (+) and HER2/neu (+). Tx with neoadjuvant TCHP chemotherapy.   Carotid bruit    L --nl doppler 5/09- and again 5/13 with 0-39% stenosis bilat   Constipation    COPD (chronic obstructive pulmonary disease) (HCC)    Diastolic dysfunction 08/02/2020   a.) TTE 08/02/2020: EF 60-65%; G1DD; triv MR/AR. b.) TTE 02/05/2021: ED 55-60%; G1DD; GLS -19.0%. c.) TTE 05/07/2021: TTE 55-60%; GLS -20.3%.   Family history of brain cancer    Family history of breast cancer    Family history of kidney cancer    Family history of lung cancer    Fatigue    Fracture of femoral neck, right (HCC) 2015   GERD (gastroesophageal reflux disease)    GI  (gastrointestinal bleed)    [name]   Hepatitis    Hyperlipidemia    Hypertension    Left arm pain    Leg pain    Chronic pain R leg from injury   Long term current use of anticoagulant    a.) apixaban   OSA and COPD overlap syndrome (HCC)    a.) no nocurnal PAP therapy; does utilize supplemental oxygen.   Osteopenia    Other organic sleep disorders    Supplemental oxygen dependent    Tobacco abuse     Medications:  Apixaban 5mg  BID Last dose: last reported 3/27 in the morning, however, patient cannot remember if she took a dose after being discharged yesterday  Assessment: 68 year old female with history of A-fib, COPD on 3 L home O2, breast cancer on chemotherapy, HTN presenting to the emergency department for evaluation of vomiting.  Patient reports that she developed recurrent nausea with 3 episodes of vomiting earlier today leading her to call EMS.   Patient was just discharged yesterday(3/31). At that time she presented with vomiting and diarrhea and was found to have a transaminitis with liver lesions for which she had a liver biopsy performed. During her admission she was  noted to have right facial weakness for which MRI demonstrated multiple small acute infarcts possibly related to embolic event in the setting of poor absorption of her Eliquis secondary to nausea and vomiting. Pharmacy has been consulted for continuous heparin infusion monitoring and dosing.  Baseline labs:  Goal of Therapy:  Heparin level 0.3-0.7 units/ml aPTT 66-102 seconds Monitor platelets by anticoagulation protocol: Yes   Plan:  Give 4000 units bolus x 1 Start heparin infusion at 900 units/hr Per patient, she can't remember if she took her apixaban yesterday evening with all of her other medications, and unable to obtain baseline levels prior to starting infusion Check both aPTT and HL level in 6 hours Continue to monitor H&H and platelets  Cadan Maggart A Eiliana Drone 11/17/2023,11:45 AM

## 2023-11-17 NOTE — Assessment & Plan Note (Signed)
 Cont statin

## 2023-11-17 NOTE — Assessment & Plan Note (Signed)
 Stable from a respiratory standpoint Continue home inhalers

## 2023-11-17 NOTE — Assessment & Plan Note (Addendum)
 Admitted March 25 through March 21 for noted embolic CVA. MRI showed multiple small acute infarcts posterior left MCA distribution, left cerebellum. Hypercoagulable state from malignancy? Cardiac embolic? Was transition back to Eliquis eliquis as oncology does not feel this was  eliquis failure (Patient had hard time absorbing it due to nausea vomiting and diarrhea) Was formally reevaluated today because of presentation with neurology feeling CVA status overall stable Repeat MRI of the brain pending On heparin drip in the setting NSTEMI Transition back to Eliquis pending evaluation Follow-up with neurology as appropriate
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
## 2023-11-17 NOTE — ED Notes (Signed)
 Patient transported to MRI

## 2023-11-17 NOTE — Consult Note (Signed)
 PHARMACY - ANTICOAGULATION CONSULT NOTE  Pharmacy Consult for Heparin Indication: chest pain/ACS and atrial fibrillation  Allergies  Allergen Reactions   Cephalexin Hives   Strawberry Extract Hives   Patient Measurements: Height: 5\' 4"  (162.6 cm) Weight: 81.6 kg (179 lb 14.3 oz) IBW/kg (Calculated) : 54.7 HEPARIN DW (KG): 72.3  Vital Signs: Temp: 98 F (36.7 C) (04/01 1602) BP: 125/56 (04/01 1602) Pulse Rate: 110 (04/01 1602)  Labs: Recent Labs    11/15/23 0541 11/16/23 0341 11/16/23 1053 11/17/23 0910 11/17/23 1200 11/17/23 1829  HGB 8.1* 7.9*  --  9.2*  --   --   HCT 24.3* 23.2*  --  27.7*  --   --   PLT 266 246  --  204  --   --   APTT 78* 65* 33  --   --  39*  LABPROT  --  14.1  --  14.7  --   --   INR  --  1.1  --  1.1  --   --   HEPARINUNFRC 0.93* 0.58  --   --   --  0.70  CREATININE  --   --   --  0.90  --   --   TROPONINIHS  --   --   --  481* 579* 791*   Estimated Creatinine Clearance: 61 mL/min (by C-G formula based on SCr of 0.9 mg/dL).  Medical History: Past Medical History:  Diagnosis Date   Anxiety    Aortic atherosclerosis (HCC)    Arthritis    Atrial fibrillation (HCC) 08/01/2020   a.) CHA2DS2-VASc = 4 (age, sex, HTN, aortic plaque). b.) chronically anticoagulated using apixaban   Breast cancer, right breast (HCC) 01/08/2021   a.) Stage IB (cT2cN0cM0) invasive mammary carcinoma of the RIGHT breast; grade I, ER/PR (+) and HER2/neu (+). Tx with neoadjuvant TCHP chemotherapy.   Carotid bruit    L --nl doppler 5/09- and again 5/13 with 0-39% stenosis bilat   Constipation    COPD (chronic obstructive pulmonary disease) (HCC)    Diastolic dysfunction 08/02/2020   a.) TTE 08/02/2020: EF 60-65%; G1DD; triv MR/AR. b.) TTE 02/05/2021: ED 55-60%; G1DD; GLS -19.0%. c.) TTE 05/07/2021: TTE 55-60%; GLS -20.3%.   Family history of brain cancer    Family history of breast cancer    Family history of kidney cancer    Family history of lung cancer     Fatigue    Fracture of femoral neck, right (HCC) 2015   GERD (gastroesophageal reflux disease)    GI (gastrointestinal bleed)    [name]   Hepatitis    Hyperlipidemia    Hypertension    Left arm pain    Leg pain    Chronic pain R leg from injury   Long term current use of anticoagulant    a.) apixaban   OSA and COPD overlap syndrome (HCC)    a.) no nocurnal PAP therapy; does utilize supplemental oxygen.   Osteopenia    Other organic sleep disorders    Supplemental oxygen dependent    Tobacco abuse    Medications:  Apixaban 5mg  BID Last dose: last reported 3/27 in the morning, however, patient cannot remember if she took a dose after being discharged yesterday -- Per patient, she can't remember if she took her apixaban yesterday evening with all of her other medications, and unable to obtain baseline levels prior to starting infusion  Assessment: 68 year old female with history of A-fib, COPD on 3 L home O2, breast cancer  on chemotherapy, HTN presenting to the emergency department for evaluation of vomiting.  Patient reports that she developed recurrent nausea with 3 episodes of vomiting earlier today leading her to call EMS.   Patient was just discharged yesterday(3/31). At that time she presented with vomiting and diarrhea and was found to have a transaminitis with liver lesions for which she had a liver biopsy performed. During her admission she was noted to have right facial weakness for which MRI demonstrated multiple small acute infarcts possibly related to embolic event in the setting of poor absorption of her Eliquis secondary to nausea and vomiting. Pharmacy has been consulted for continuous heparin infusion monitoring and dosing.  Baseline labs: Hgb 9.2 Plt 204  Goal of Therapy:  Heparin level 0.3-0.7 units/ml aPTT 66-102 seconds Monitor platelets by anticoagulation protocol: Yes  Heparin/aPTT Date/Time HL/aPTT Clinical Assessment 4/1@1829  0.70/39 Dosing based on  aPTT; SUBtherapeutic    Plan:  -- Give 2,100 heparin bolus x 1 -- Increase heparin infusion to 1,100 units/hr from 900 units/hr -- Continue to check both aPTT and HL level in 6 hours - not correlated -- Continue to monitor H&H and platelets  Thank you for allowing pharmacy to participate in this patient's care.   Effie Shy, PharmD Pharmacy Resident  11/17/2023 7:30 PM

## 2023-11-17 NOTE — ED Provider Notes (Signed)
 [facility] Provider Note    Event Date/Time   First MD Initiated Contact with Patient 11/17/23 [phone]     (approximate)   History   Weakness, Aphasia, and Facial Droop   HPI  Debra [name] is a 68 year old female with history of A-fib, COPD on 3 L home O2, breast cancer on chemotherapy, HTN presenting to the emergency department for evaluation of vomiting.  Patient reports that she developed recurrent nausea with 3 episodes of vomiting earlier today leading her to call EMS.  There was an EMS report that patient was last well last night around 8 PM with new deficits of slurred speech and a facial droop, but patient tells me that these have been ongoing for over a week and are not acutely changed currently.  She denies abdominal pain, diarrhea, chest pain, shortness of breath.  Patient was just discharged yesterday.  At that time she presented with vomiting and diarrhea and was found to have a transaminitis with liver lesions for which she had a liver biopsy performed.  During her admission she was noted to have right facial weakness for which MRI demonstrated multiple small acute infarcts possibly related to embolic event in the setting of poor absorption of her Eliquis secondary to nausea and vomiting.  I reviewed her neurology consultation during her recent admission.  At that time patient was noted to have dysarthria with a facial palsy and an NIH of 2.      Physical Exam   Triage Vital Signs: ED Triage Vitals [11/17/23 0906]  Encounter Vitals Group     BP      Systolic BP Percentile      Diastolic BP Percentile      Pulse      Resp      Temp      Temp src      SpO2      Weight 179 lb 14.3 oz (81.6 kg)     Height      Head Circumference      Peak Flow      Pain Score 0     Pain Loc      Pain Education      Exclude from Growth Chart     Most recent vital signs: Vitals:   11/17/23 0930 11/17/23 1030  BP: (!) 111/51 112/61  Pulse: 78 81  Resp:  15 16  Temp:    SpO2: 100% 100%     General: Awake, interactive  CV:  Regular rate, good peripheral perfusion.  Resp:  Unlabored respirations, lungs clear to auscultation Abd:  Nondistended, soft, nontender to palpation Neuro:  Keenly aware, correctly answers month and age, able to blink eyes and squeeze hands, normal horizontal extraocular movements, no visual field loss, right facial droop present, no arm or leg motor drift, no limb ataxia, normal sensation, no aphasia, dysarthria present though speech is overall understandable, no inattention. NIH 2.       ED Results / Procedures / Treatments   Labs (all labs ordered are listed, but only abnormal results are displayed) Labs Reviewed  CBC WITH DIFFERENTIAL/PLATELET - Abnormal; Notable for the following components:      Result Value   WBC 11.7 (*)    RBC 3.00 (*)    Hemoglobin 9.2 (*)    HCT 27.7 (*)    Neutro Abs 9.5 (*)    Abs Immature Granulocytes 0.08 (*)    All other components within normal limits  COMPREHENSIVE METABOLIC  PANEL WITH GFR - Abnormal; Notable for the following components:   Glucose, Bld 122 (*)    Albumin 3.2 (*)    AST 42 (*)    ALT 67 (*)    All other components within normal limits  TROPONIN I (HIGH SENSITIVITY) - Abnormal; Notable for the following components:   Troponin I (High Sensitivity) 481 (*)    All other components within normal limits  LIPASE, BLOOD     EKG EKG independently reviewed interpreted by myself (ER attending) demonstrates:  EKG demonstrates sinus rhythm at a rate of 84, PR 148, QRS 89, QTc 450, no acute ST changes  RADIOLOGY Imaging independently reviewed and interpreted by myself demonstrates:  CT head without acute bleed, radiology notes developing versus new PICA infarct, with clinical history suspect that this is likely more developing as patient does not have new deficits, also notes stable MCA infarcts CXR without focal consolidation  PROCEDURES:  Critical Care  performed: Yes, see critical care procedure note(s)  CRITICAL CARE Performed by: Trinna Post   Total critical care time: 32 minutes  Critical care time was exclusive of separately billable procedures and treating other patients.  Critical care was necessary to treat or prevent imminent or life-threatening deterioration.  Critical care was time spent personally by me on the following activities: development of treatment plan with patient and/or surrogate as well as nursing, discussions with consultants, evaluation of patient's response to treatment, examination of patient, obtaining history from patient or surrogate, ordering and performing treatments and interventions, ordering and review of laboratory studies, ordering and review of radiographic studies, pulse oximetry and re-evaluation of patient's condition.   Procedures   MEDICATIONS ORDERED IN ED: Medications  aspirin tablet 325 mg (has no administration in time range)  ondansetron (ZOFRAN) injection 4 mg (4 mg Intravenous Given 11/17/23 0943)  sodium chloride 0.9 % bolus 1,000 mL (1,000 mLs Intravenous New Bag/Given 11/17/23 0943)     IMPRESSION / MDM / ASSESSMENT AND PLAN / ED COURSE  I reviewed the triage vital signs and the nursing notes.  Differential diagnosis includes, but is not limited to, recrudescence of prior stroke, acute bleed, lower suspicion acute stroke given stable neurologic exam, lower suspicion acute intra-abdominal process given recent negative imaging, consideration for atypical ACS  Patient's presentation is most consistent with acute presentation with potential threat to life or bodily function.  68 year old female presenting to the emergency department for evaluation of nausea and vomiting, initial concern for new neurologic deficits though in obtaining additional history from patient, it does not appear that these have significantly changed. Her NIH is 2, overall consistent with previously documented neuro  exam. Labs with stable anemia and mild leukocytosis.  CMP without critical derangements.  Negative lipase.  Troponin did return critically elevated at 481, was slightly elevated at 93 a week ago.  Patient without chest pain, but consideration for her nausea as a anginal equivalent.  CT head without acute bleed.  X-Jerelene Salaam without focal consolidation on my review.  EKG without acute ischemic findings.  However, given her significant troponin elevation ordered for aspirin and heparin drip.  Do think she is appropriate for readmission in the setting of this.  Will reach out to hospitalist team.  Case discussed with Dr. Alvester Morin.  He will evaluate the patient for anticipated admission.  With possible progression versus new CVA noted on noncontrast head CT, did request I speak with neurology for further recommendations.  Case reviewed with Dr. Otelia Limes who recommended MRI for further  evaluation.  Order has been placed.  Hospitalist team updated.  Patient admitted for further management.      FINAL CLINICAL IMPRESSION(S) / ED DIAGNOSES   Final diagnoses:  Nausea and vomiting, unspecified vomiting type  NSTEMI (non-ST elevated myocardial infarction) (HCC)     Rx / DC Orders   ED Discharge Orders     None        Note:  This document was prepared using Dragon voice recognition software and may include unintentional dictation errors.   Trinna Post, MD 11/17/23 1407    Trinna Post, MD 11/18/23 [phone]

## 2023-11-17 NOTE — Assessment & Plan Note (Signed)
 Uptrending troponin in setting of recent embolic CVA with persistent nausea x 24 hours Troponin 481-5 79 EKG stable Suspect demand ischemia in the setting of recent CVA event Heparin drip started in the ER Consult cardiology Follow-up recommendations

## 2023-11-17 NOTE — ED Notes (Signed)
 Patient transported to CT
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
## 2023-11-17 NOTE — ED Notes (Signed)
 Heparin infusion paused while in MRI due to malfunction of MRI pump.

## 2023-11-17 NOTE — Assessment & Plan Note (Addendum)
 Baseline history of breast cancer followed by Dr. Cathie Hoops Noted liver lesion with concern for metastatic etiology status post biopsy during recent admission Monitor Continue tamoxifen Follow-up biopsy in outpatient oncology follow-up

## 2023-11-17 NOTE — ED Triage Notes (Signed)
 Pt arrives from home via EMS for weakness, slurred speech and R facial droop. Pt seen last week and dx with CVA. Pt was normal at 8pm last night, woke up at 3am with weakness and again at 5am with the slurred speech and facial droop. Pt currently taking eliquis, last dose yesterday.

## 2023-11-17 NOTE — Assessment & Plan Note (Signed)
 On home apixaban-- currently on IV heparin drip in setting of NSTEMI  Cont home diltiazem

## 2023-11-18 ENCOUNTER — Encounter: Payer: Self-pay | Admitting: Family Medicine

## 2023-11-18 DIAGNOSIS — R7989 Other specified abnormal findings of blood chemistry: Secondary | ICD-10-CM | POA: Diagnosis not present

## 2023-11-18 DIAGNOSIS — R4701 Aphasia: Secondary | ICD-10-CM | POA: Diagnosis present

## 2023-11-18 DIAGNOSIS — K219 Gastro-esophageal reflux disease without esophagitis: Secondary | ICD-10-CM | POA: Diagnosis present

## 2023-11-18 DIAGNOSIS — C50911 Malignant neoplasm of unspecified site of right female breast: Secondary | ICD-10-CM | POA: Diagnosis present

## 2023-11-18 DIAGNOSIS — I63412 Cerebral infarction due to embolism of left middle cerebral artery: Secondary | ICD-10-CM | POA: Diagnosis present

## 2023-11-18 DIAGNOSIS — R197 Diarrhea, unspecified: Secondary | ICD-10-CM | POA: Diagnosis present

## 2023-11-18 DIAGNOSIS — I2489 Other forms of acute ischemic heart disease: Secondary | ICD-10-CM | POA: Diagnosis present

## 2023-11-18 DIAGNOSIS — I482 Chronic atrial fibrillation, unspecified: Secondary | ICD-10-CM | POA: Diagnosis present

## 2023-11-18 DIAGNOSIS — I480 Paroxysmal atrial fibrillation: Secondary | ICD-10-CM | POA: Diagnosis present

## 2023-11-18 DIAGNOSIS — Z853 Personal history of malignant neoplasm of breast: Secondary | ICD-10-CM | POA: Diagnosis not present

## 2023-11-18 DIAGNOSIS — R29810 Facial weakness: Secondary | ICD-10-CM | POA: Diagnosis present

## 2023-11-18 DIAGNOSIS — G4733 Obstructive sleep apnea (adult) (pediatric): Secondary | ICD-10-CM | POA: Diagnosis present

## 2023-11-18 DIAGNOSIS — I493 Ventricular premature depolarization: Secondary | ICD-10-CM | POA: Diagnosis present

## 2023-11-18 DIAGNOSIS — Z8701 Personal history of pneumonia (recurrent): Secondary | ICD-10-CM | POA: Diagnosis not present

## 2023-11-18 DIAGNOSIS — J4489 Other specified chronic obstructive pulmonary disease: Secondary | ICD-10-CM | POA: Diagnosis present

## 2023-11-18 DIAGNOSIS — D630 Anemia in neoplastic disease: Secondary | ICD-10-CM | POA: Diagnosis present

## 2023-11-18 DIAGNOSIS — I639 Cerebral infarction, unspecified: Secondary | ICD-10-CM | POA: Diagnosis not present

## 2023-11-18 DIAGNOSIS — J9611 Chronic respiratory failure with hypoxia: Secondary | ICD-10-CM | POA: Diagnosis present

## 2023-11-18 DIAGNOSIS — R471 Dysarthria and anarthria: Secondary | ICD-10-CM | POA: Diagnosis present

## 2023-11-18 DIAGNOSIS — I10 Essential (primary) hypertension: Secondary | ICD-10-CM | POA: Diagnosis present

## 2023-11-18 DIAGNOSIS — Z9981 Dependence on supplemental oxygen: Secondary | ICD-10-CM | POA: Diagnosis not present

## 2023-11-18 DIAGNOSIS — C770 Secondary and unspecified malignant neoplasm of lymph nodes of head, face and neck: Secondary | ICD-10-CM | POA: Diagnosis present

## 2023-11-18 DIAGNOSIS — E785 Hyperlipidemia, unspecified: Secondary | ICD-10-CM | POA: Diagnosis present

## 2023-11-18 DIAGNOSIS — R112 Nausea with vomiting, unspecified: Secondary | ICD-10-CM | POA: Diagnosis present

## 2023-11-18 DIAGNOSIS — R29702 NIHSS score 2: Secondary | ICD-10-CM | POA: Diagnosis present

## 2023-11-18 DIAGNOSIS — I340 Nonrheumatic mitral (valve) insufficiency: Secondary | ICD-10-CM | POA: Diagnosis present

## 2023-11-18 DIAGNOSIS — I63442 Cerebral infarction due to embolism of left cerebellar artery: Secondary | ICD-10-CM | POA: Diagnosis present

## 2023-11-18 LAB — ECHOCARDIOGRAM LIMITED
AV Mean grad: 6 mmHg
AV Peak grad: 13.5 mmHg
Ao pk vel: 1.84 m/s
Area-P 1/2: 3.63 cm2
Height: 64 in
S' Lateral: 2.8 cm
Weight: 2878.33 [oz_av]

## 2023-11-18 LAB — CBC
HCT: 24 % — ABNORMAL LOW (ref 36.0–46.0)
Hemoglobin: 8.1 g/dL — ABNORMAL LOW (ref 12.0–15.0)
MCH: 30.6 pg (ref 26.0–34.0)
MCHC: 33.8 g/dL (ref 30.0–36.0)
MCV: 90.6 fL (ref 80.0–100.0)
Platelets: 183 10*3/uL (ref 150–400)
RBC: 2.65 MIL/uL — ABNORMAL LOW (ref 3.87–5.11)
RDW: 14.6 % (ref 11.5–15.5)
WBC: 10 10*3/uL (ref 4.0–10.5)
nRBC: 0 % (ref 0.0–0.2)

## 2023-11-18 LAB — APTT
aPTT: 53 s — ABNORMAL HIGH (ref 24–36)
aPTT: >200 s (ref 24–36)
aPTT: 42 s — ABNORMAL HIGH (ref 24–36)

## 2023-11-18 LAB — SURGICAL PATHOLOGY

## 2023-11-18 LAB — HEPARIN LEVEL (UNFRACTIONATED): Heparin Unfractionated: 0.63 [IU]/mL (ref 0.30–0.70)

## 2023-11-18 LAB — TROPONIN I (HIGH SENSITIVITY): Troponin I (High Sensitivity): 405 ng/L (ref <18)

## 2023-11-18 MED ORDER — HEPARIN BOLUS VIA INFUSION
2100.0000 [IU] | Freq: Once | INTRAVENOUS | Status: AC
  Administered 2023-11-18: 2100 [IU] via INTRAVENOUS
  Filled 2023-11-18: qty 2100

## 2023-11-18 MED ORDER — ENSURE ENLIVE PO LIQD
237.0000 mL | Freq: Three times a day (TID) | ORAL | Status: DC
  Administered 2023-11-18 – 2023-11-20 (×4): 237 mL via ORAL

## 2023-11-18 MED ORDER — HEPARIN (PORCINE) 25000 UT/250ML-% IV SOLN
1600.0000 [IU]/h | INTRAVENOUS | Status: DC
  Administered 2023-11-18: 1200 [IU]/h via INTRAVENOUS
  Administered 2023-11-19: 1600 [IU]/h via INTRAVENOUS
  Filled 2023-11-18 (×2): qty 250

## 2023-11-18 MED ORDER — PROMETHAZINE HCL 25 MG PO TABS: 25.0000 mg | ORAL_TABLET | Freq: Four times a day (QID) | ORAL | Status: DC | PRN

## 2023-11-18 NOTE — ED Notes (Signed)
 Called the floor and gave report

## 2023-11-18 NOTE — Consult Note (Signed)
 PHARMACY - ANTICOAGULATION CONSULT NOTE  Pharmacy Consult for Heparin Indication: chest pain/ACS and atrial fibrillation  Allergies  Allergen Reactions   Cephalexin Hives   Strawberry Extract Hives   Patient Measurements: Height: 5\' 4"  (162.6 cm) Weight: 81.6 kg (179 lb 14.3 oz) IBW/kg (Calculated) : 54.7 HEPARIN DW (KG): 72.3  Vital Signs: Temp: 97.9 F (36.6 C) (04/02 1037) Temp Source: Oral (04/02 0437) BP: 118/58 (04/02 1037) Pulse Rate: 80 (04/02 1037)  Labs: Recent Labs    11/16/23 0341 11/16/23 1053 11/17/23 0910 11/17/23 1200 11/17/23 1829 11/18/23 0250 11/18/23 1149  HGB 7.9*  --  9.2*  --   --  8.1*  --   HCT 23.2*  --  27.7*  --   --  24.0*  --   PLT 246  --  204  --   --  183  --   APTT 65*   < >  --   --  39* 53* >200*  LABPROT 14.1  --  14.7  --   --   --   --   INR 1.1  --  1.1  --   --   --   --   HEPARINUNFRC 0.58  --   --   --  0.70 0.63  --   CREATININE  --   --  0.90  --   --   --   --   TROPONINIHS  --    < > 481* 579* 791*  --  405*   < > = values in this interval not displayed.   Estimated Creatinine Clearance: 61 mL/min (by C-G formula based on SCr of 0.9 mg/dL).  Medical History: Past Medical History:  Diagnosis Date   Anxiety    Aortic atherosclerosis (HCC)    Arthritis    Atrial fibrillation (HCC) 08/01/2020   a.) CHA2DS2-VASc = 4 (age, sex, HTN, aortic plaque). b.) chronically anticoagulated using apixaban   Breast cancer, right breast (HCC) 01/08/2021   a.) Stage IB (cT2cN0cM0) invasive mammary carcinoma of the RIGHT breast; grade I, ER/PR (+) and HER2/neu (+). Tx with neoadjuvant TCHP chemotherapy.   Carotid bruit    L --nl doppler 5/09- and again 5/13 with 0-39% stenosis bilat   Constipation    COPD (chronic obstructive pulmonary disease) (HCC)    Diastolic dysfunction 08/02/2020   a.) TTE 08/02/2020: EF 60-65%; G1DD; triv MR/AR. b.) TTE 02/05/2021: ED 55-60%; G1DD; GLS -19.0%. c.) TTE 05/07/2021: TTE 55-60%; GLS -20.3%.    Family history of brain cancer    Family history of breast cancer    Family history of kidney cancer    Family history of lung cancer    Fatigue    Fracture of femoral neck, right (HCC) 2015   GERD (gastroesophageal reflux disease)    GI (gastrointestinal bleed)    [name]   Hepatitis    Hyperlipidemia    Hypertension    Left arm pain    Leg pain    Chronic pain R leg from injury   Long term current use of anticoagulant    a.) apixaban   OSA and COPD overlap syndrome (HCC)    a.) no nocurnal PAP therapy; does utilize supplemental oxygen.   Osteopenia    Other organic sleep disorders    Supplemental oxygen dependent    Tobacco abuse    Medications:  Apixaban 5mg  BID Last dose: last reported 3/27 in the morning, however, patient cannot remember if she took a dose after being discharged yesterday --  Per patient, she can't remember if she took her apixaban yesterday evening with all of her other medications, and unable to obtain baseline levels prior to starting infusion  Assessment: 68 year old female with history of A-fib, COPD on 3 L home O2, breast cancer on chemotherapy, HTN presenting to the emergency department for evaluation of vomiting.  Patient reports that she developed recurrent nausea with 3 episodes of vomiting earlier today leading her to call EMS.   Patient was just discharged yesterday(3/31). At that time she presented with vomiting and diarrhea and was found to have a transaminitis with liver lesions for which she had a liver biopsy performed. During her admission she was noted to have right facial weakness for which MRI demonstrated multiple small acute infarcts possibly related to embolic event in the setting of poor absorption of her Eliquis secondary to nausea and vomiting. Pharmacy has been consulted for continuous heparin infusion monitoring and dosing.  Baseline labs: Hgb 9.2 Plt 204  Goal of Therapy:  Heparin level 0.3-0.7 units/ml aPTT 66-102  seconds Monitor platelets by anticoagulation protocol: Yes  Heparin/aPTT Date/Time HL/aPTT Clinical Assessment 4/1@1829  0.70/39 Dosing based on aPTT; SUBtherapeutic  4/2 @0250  0.63 / 53 aPTT, SUBtherapeutic 4/2@1149  -- / >200 aPTT, SUPRAtherapeutic   Plan:  -- Nurse aware, will hold infusion for 1 hour -- Decrease heparin infusion to 1,100 units/hr -- Recheck HL in 6 hrs after re-initiation  -- Continue to monitor HL daily until correlation w/ aPTT confirmed -- Continue to monitor H&H and platelets  Thank you for allowing pharmacy to participate in this patient's care.   Bettey Costa, PharmD Clinical Pharmacist 11/18/2023 1:25 PM

## 2023-11-18 NOTE — ED Notes (Signed)
 CRITICAL VALUE STICKER  CRITICAL VALUE: PTT  DATE & TIME NOTIFIED: 11/18/2023 1304  MD NOTIFIED: Dr Georgeann Oppenheim  TIME OF NOTIFICATION:1405  RESPONSE: Acknowledged

## 2023-11-18 NOTE — TOC Progression Note (Signed)
 Transition of Care [facility]) - Progression Note    Patient Details  Name: Debra [name] MRN: [id number] Date of Birth: Dec 28, 1955  [facility] CM/SW Contact  Colin Broach, LCSW Phone Number: 11/18/2023, 1:44 PM  Clinical Narrative:    CSW spoke with patient's husband by phone (with patient present) to discuss recommendation for Inpatient rehab.  Patient is amenable to trying IP rehab.  Estill Dooms notified by [employer].          Expected Discharge Plan and Services                                               Social Determinants of Health (SDOH) Interventions SDOH Screenings   Food Insecurity: No Food Insecurity (11/18/2023)  Housing: Low Risk  (11/18/2023)  Transportation Needs: No Transportation Needs (11/18/2023)  Utilities: Not At Risk (11/18/2023)  Financial Resource Strain: Low Risk  (06/15/2023)   Received from [facility]  Physical Activity: Unknown (12/17/2017)  Social Connections: Moderately Isolated (11/18/2023)  Stress: No Stress Concern Present (12/17/2017)  Tobacco Use: Medium Risk (11/18/2023)    Readmission Risk Interventions     No data to display
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
## 2023-11-18 NOTE — Progress Notes (Signed)
 PROGRESS NOTE    Debra [name]  RUE:[id number] DOB: [birth year] DOA: 11/17/2023 PCP: Barbette Reichmann, MD    Brief Narrative:  68 y.o. 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  Patient noted to have been recently admitted March 25 to March 31 for issues including intractable nausea and vomiting, acute embolic CVA, transaminitis with new liver lesions.  Had fairly extensive workup at that time with MRI showing small acute infarcts posterior left MCA distribution with concern for hypercoagulable state versus malignancy versus cardiac emboli.  Neurology was formally evaluated.  Patient was placed on IV heparin drip until biopsy was completed in the setting of liver lesion.  Oncology did not feel that patient had Eliquis failure.  However thought that patient had a hard time absorbing medication secondary to recurrent nausea vomiting and diarrhea.  Was discharged on Eliquis.  Per report, patient had significant nausea and malaise at home.  No true chest pain.  No focal diaphoresis or sweating.  No belly pain or diarrhea.  Patient with noted predominate weakness slurred speech and right facial droop.  Has not had any missed dose of Eliquis.   Assessment & Plan:   Principal Problem:   Recurrent cerebrovascular accidents (CVAs) (HCC) Active Problems:   NSTEMI (non-ST elevated myocardial infarction) (HCC)   History of CVA (cerebrovascular accident)   Paroxysmal atrial fibrillation (HCC)   Hyperlipidemia   COPD with chronic bronchitis (HCC)   History of breast cancer   Elevated troponin   Acute CVA (cerebrovascular accident) (HCC)  Elevated troponin Cardiology following.  Troponin elevation relatively mild.  Nonspecific.  Favored to reflect  supply/demand mismatch in the setting of recurrent CVA.  No EKG changes or chest pain to suggest true NSTEMI. Plan: Heparin GTT x 48 hours Transition back to Eliquis 5 mg twice daily after heparin complete Cardiac monitoring   History of CVA (cerebrovascular accident) New cerebellar CVA Admitted March 25 through March 21 for noted embolic CVA. New infarcts noted in cerebellum on repeat image Not considered a treatment failure for Eliquis as patient is having difficulty tolerating due to intractable nausea and vomiting Suspect poor absorption as a result Plan: Currently on heparin gtt. Neurology consulted, recommendations appreciated Plan to transition back to Eliquis at time of discharge or within 48 hours  Paroxysmal atrial fibrillation (HCC) Home Eliquis held.  Continue home diltiazem.  On) gtt.     History of breast cancer Baseline history of breast cancer followed by Dr. Cathie Hoops Noted liver lesion with concern for metastatic etiology status post biopsy during recent admission Monitor Continue tamoxifen Follow-up biopsy in outpatient oncology follow-up   COPD with chronic bronchitis (HCC) Stable from a respiratory standpoint Continue home inhalers   Hyperlipidemia Cont statin      DVT prophylaxis: IV heparin Code Status: Full Family Communication: None today Disposition Plan: Status is: Inpatient Remains inpatient appropriate because: Acute CVA.  Elevated troponin on heparin gtt.     Level of care: Telemetry Cardiac  Consultants:  Cardiology Neurology  Procedures:  None  Antimicrobials: None   Subjective: Seen and examined.  Sitting up in bed.  Voice slightly dysarthric but overall stable  Objective: Vitals:   11/18/23 0732 11/18/23 1000 11/18/23 1037 11/18/23 1200  BP: 120/62 (!) 118/58 (!) 118/58 105/60  Pulse: 77 80 80  75  Resp: 12  12 15   Temp: 97.9 F (36.6 C)  97.9 F (36.6 C)   TempSrc:      SpO2: 94% 93% 93% 93%  Weight:      Height:         Intake/Output Summary (Last 24 hours) at 11/18/2023 1437 Last data filed at 11/17/2023 1926 Gross per 24 hour  Intake 94.18 ml  Output --  Net 94.18 ml   Filed Weights   11/17/23 0906  Weight: 81.6 kg    Examination:  General exam: NAD Respiratory system: Clear to auscultation. Respiratory effort normal. Cardiovascular system: S1-S2, RRR, no murmurs, no pedal edema Gastrointestinal system:, NT/ND, normal bowel sounds Central nervous system: Alert and oriented.  Speech dysarthric Extremities: Symmetric 5 x 5 power. Skin: No rashes, lesions or ulcers Psychiatry: Judgement and insight appear normal. Mood & affect appropriate.     Data Reviewed: I have personally reviewed following labs and imaging studies  CBC: Recent Labs  Lab 11/14/23 0430 11/15/23 0541 11/16/23 0341 11/17/23 0910 11/18/23 0250  WBC 10.4 11.0* 11.0* 11.7* 10.0  NEUTROABS  --   --   --  9.5*  --   HGB 8.2* 8.1* 7.9* 9.2* 8.1*  HCT 24.3* 24.3* 23.2* 27.7* 24.0*  MCV 90.0 89.3 89.6 92.3 90.6  PLT 259 266 246 204 183   Basic Metabolic Panel: Recent Labs  Lab 11/12/23 0555 11/13/23 0157 11/17/23 0910  NA 137 142 139  K 3.3* 3.7 3.6  CL 103 108 102  CO2 26 27 29   GLUCOSE 96 110* 122*  BUN 9 9 15   CREATININE 0.93 0.81 0.90  CALCIUM 8.5* 8.7* 9.0   GFR: Estimated Creatinine Clearance: 61 mL/min (by C-G formula based on SCr of 0.9 mg/dL). Liver Function Tests: Recent Labs  Lab 11/12/23 0555 11/13/23 0157 11/17/23 0910  AST 74* 68* 42*  ALT 120* 113* 67*  ALKPHOS 46 49 49  BILITOT 0.7 0.6 0.7  PROT 6.0* 6.2* 6.9  ALBUMIN 2.8* 2.9* 3.2*   Recent Labs  Lab 11/17/23 0910  LIPASE 49   No results for input(s): "AMMONIA" in the last 168 hours. Coagulation Profile: Recent Labs  Lab 11/13/23 0157 11/16/23 0341 11/17/23 0910  INR 1.6* 1.1 1.1   Cardiac Enzymes: No results for input(s): "CKTOTAL", "CKMB", "CKMBINDEX", "TROPONINI" in the last 168 hours. BNP (last 3 results) No  results for input(s): "PROBNP" in the last 8760 hours. HbA1C: No results for input(s): "HGBA1C" in the last 72 hours. CBG: Recent Labs  Lab 11/12/23 0045  GLUCAP 94   Lipid Profile: No results for input(s): "CHOL", "HDL", "LDLCALC", "TRIG", "CHOLHDL", "LDLDIRECT" in the last 72 hours. Thyroid Function Tests: No results for input(s): "TSH", "T4TOTAL", "FREET4", "T3FREE", "THYROIDAB" in the last 72 hours. Anemia Panel: No results for input(s): "VITAMINB12", "FOLATE", "FERRITIN", "TIBC", "IRON", "RETICCTPCT" in the last 72 hours. Sepsis Labs: No results for input(s): "PROCALCITON", "LATICACIDVEN" in the last 168 hours.  Recent Results (from the past 240 hours)  Resp panel by RT-PCR (RSV, Flu A&B, Covid) Anterior Nasal Swab     Status: None   Collection Time: 11/10/23 11:05 PM   Specimen: Anterior Nasal Swab  Result Value Ref Range Status   SARS Coronavirus 2 by RT PCR NEGATIVE NEGATIVE Final    Comment: (NOTE) SARS-CoV-2 target nucleic acids are NOT DETECTED.  The SARS-CoV-2 RNA is generally detectable in upper respiratory specimens during the acute phase of infection. The lowest concentration of SARS-CoV-2 viral copies this assay can  detect is 138 copies/mL. A negative result does not preclude SARS-Cov-2 infection and should not be used as the sole basis for treatment or other patient management decisions. A negative result may occur with  improper specimen collection/handling, submission of specimen other than nasopharyngeal swab, presence of viral mutation(s) within the areas targeted by this assay, and inadequate number of viral copies(<138 copies/mL). A negative result must be combined with clinical observations, patient history, and epidemiological information. The expected result is Negative.  Fact Sheet for Patients:  BloggerCourse.com  Fact Sheet for Healthcare Providers:  SeriousBroker.it  This test is no t yet  approved or cleared by the [place] FDA and  has been authorized for detection and/or diagnosis of SARS-CoV-2 by FDA under an Emergency Use Authorization (EUA). This EUA will remain  in effect (meaning this test can be used) for the duration of the COVID-19 declaration under Section 564(b)(1) of the Act, 21 U.S.C.section 360bbb-3(b)(1), unless the authorization is terminated  or revoked sooner.       Influenza A by PCR NEGATIVE NEGATIVE Final   Influenza B by PCR NEGATIVE NEGATIVE Final    Comment: (NOTE) The Xpert Xpress SARS-CoV-2/FLU/RSV plus assay is intended as an aid in the diagnosis of influenza from Nasopharyngeal swab specimens and should not be used as a sole basis for treatment. Nasal washings and aspirates are unacceptable for Xpert Xpress SARS-CoV-2/FLU/RSV testing.  Fact Sheet for Patients: BloggerCourse.com  Fact Sheet for Healthcare Providers: SeriousBroker.it  This test is not yet approved or cleared by the [place] FDA and has been authorized for detection and/or diagnosis of SARS-CoV-2 by FDA under an Emergency Use Authorization (EUA). This EUA will remain in effect (meaning this test can be used) for the duration of the COVID-19 declaration under Section 564(b)(1) of the Act, 21 U.S.C. section 360bbb-3(b)(1), unless the authorization is terminated or revoked.     Resp Syncytial Virus by PCR NEGATIVE NEGATIVE Final    Comment: (NOTE) Fact Sheet for Patients: BloggerCourse.com  Fact Sheet for Healthcare Providers: SeriousBroker.it  This test is not yet approved or cleared by the [place] FDA and has been authorized for detection and/or diagnosis of SARS-CoV-2 by FDA under an Emergency Use Authorization (EUA). This EUA will remain in effect (meaning this test can be used) for the duration of the COVID-19 declaration under Section 564(b)(1) of  the Act, 21 U.S.C. section 360bbb-3(b)(1), unless the authorization is terminated or revoked.  Performed at [facility], [address] [place], [place] [address]   Blood culture (routine x 2)     Status: None   Collection Time: 11/11/23 12:10 AM   Specimen: BLOOD  Result Value Ref Range Status   Specimen Description BLOOD LEFT ANTECUBITAL  Final   Special Requests   Final    BOTTLES DRAWN AEROBIC AND ANAEROBIC Blood Culture adequate volume   Culture   Final    NO GROWTH 5 DAYS Performed at [facility], [address] [place], [place] [address]    Report Status 11/16/2023 FINAL  Final  Blood culture (routine x 2)     Status: None   Collection Time: 11/11/23 12:39 AM   Specimen: BLOOD  Result Value Ref Range Status   Specimen Description BLOOD BLOOD RIGHT HAND  Final   Special Requests   Final    BOTTLES DRAWN AEROBIC AND ANAEROBIC Blood Culture adequate volume   Culture   Final    NO GROWTH 5 DAYS Performed at [facility], [address], [place], [place] [address]    Report Status 11/16/2023 FINAL  Final  MRSA Next Gen by PCR, Nasal     Status: None   Collection Time: 11/12/23  1:40 AM   Specimen: Nasal Mucosa; Nasal Swab  Result Value Ref Range Status   MRSA by PCR Next Gen NOT DETECTED NOT DETECTED Final    Comment: (NOTE) The GeneXpert MRSA Assay (FDA approved for NASAL specimens only), is one component of a comprehensive MRSA colonization surveillance program. It is not intended to diagnose MRSA infection nor to guide or monitor treatment for MRSA infections. Test performance is not FDA approved in patients less than 22 years old. Performed at [facility], [address], [place], [place] [address]          Radiology Studies: ECHOCARDIOGRAM LIMITED Result Date: 11/18/2023    ECHOCARDIOGRAM LIMITED REPORT   Patient Name:   Debra [name] Date of Exam: 11/17/2023 Medical Rec #:  [id number]    Height:       64.0 in  Accession #:    [id number]   Weight:       179.9 lb Date of Birth:  14-Mar-1956     BSA:          1.870 m Patient Age:    67 years     BP:           114/88 mmHg Patient Gender: F            HR:           84 bpm. Exam Location:  [facility] Procedure: 2D Echo, Limited Echo, Limited Color Doppler and Cardiac Doppler            (Both Spectral and Color Flow Doppler were utilized during            procedure). Indications:     Elevated Troponin  History:         Patient has prior history of Echocardiogram examinations, most                  recent 11/11/2023. COPD, Arrythmias:Atrial Fibrillation; Risk                  Factors:Hypertension and Dyslipidemia. Obstructive sleep apnea.                  Fatigue. Breast Cancer.  Sonographer:     Daphine Deutscher RDCS Referring Phys:  [id number] Raymon Mutton DUNN Diagnosing Phys: Yvonne Kendall MD IMPRESSIONS  1. Left ventricular ejection fraction, by estimation, is 60 to 65%. The left ventricle has normal function. The left ventricle has no regional wall motion abnormalities. Left ventricular diastolic parameters were normal.  2. Right ventricular systolic function is normal. The right ventricular size is normal. Tricuspid regurgitation signal is inadequate for assessing PA pressure.  3. The mitral valve is degenerative. At least moderate mitral valve regurgitation, which may be underestimated due to jet eccentricity. No evidence of mitral stenosis.  4. Aortic valve regurgitation is not visualized. No aortic stenosis is present.  5. The inferior vena cava is normal in size with greater than 50% respiratory variability, suggesting right atrial pressure of 3 mmHg. FINDINGS  Left Ventricle: Left ventricular ejection fraction, by estimation, is 60 to 65%. The left ventricle has normal function. The left ventricle has no regional wall motion abnormalities. The left ventricular internal cavity size was normal in size. There is  no left ventricular hypertrophy. Left ventricular diastolic  parameters were  normal. Right Ventricle: The right ventricular size is normal. No increase in right ventricular wall thickness. Right ventricular systolic function is normal. Tricuspid regurgitation signal is inadequate for assessing PA pressure. Pericardium: There is no evidence of pericardial effusion. Mitral Valve: The mitral valve is degenerative in appearance. There is moderate thickening of the mitral valve leaflet(s). Moderate mitral valve regurgitation, with wall-impinging jet. No evidence of mitral valve stenosis. Tricuspid Valve: The tricuspid valve is normal in structure. Tricuspid valve regurgitation is trivial. Aortic Valve: Aortic valve regurgitation is not visualized. No aortic stenosis is present. Aortic valve mean gradient measures 6.0 mmHg. Aortic valve peak gradient measures 13.5 mmHg. Aorta: The aortic root is normal in size and structure. Venous: The inferior vena cava is normal in size with greater than 50% respiratory variability, suggesting right atrial pressure of 3 mmHg. LEFT VENTRICLE PLAX 2D LVIDd:         5.20 cm Diastology LVIDs:         2.80 cm LV e' medial:    12.35 cm/s LV PW:         0.70 cm LV E/e' medial:  8.9 LV IVS:        0.70 cm LV e' lateral:   15.75 cm/s                        LV E/e' lateral: 7.0  RIGHT VENTRICLE             IVC RV S prime:     19.85 cm/s  IVC diam: 1.60 cm LEFT ATRIUM         Index LA diam:    3.70 cm 1.98 cm/m  AORTIC VALVE AV Vmax:      183.59 cm/s AV Vmean:     114.120 cm/s AV VTI:       0.317 m AV Peak Grad: 13.5 mmHg AV Mean Grad: 6.0 mmHg  AORTA Ao Root diam: 3.40 cm MITRAL VALVE MV Area (PHT): 3.63 cm MV VTI:        0.40 m MV Decel Time: 209 msec MV E velocity: 109.67 cm/s MV A velocity: 142.67 cm/s MV E/A ratio:  0.77 Cristal Deer End MD Electronically signed by Yvonne Kendall MD Signature Date/Time: 11/18/2023/5:49:00 AM    Final    MR BRAIN WO CONTRAST Result Date: 11/17/2023 CLINICAL DATA:  Provided history: Neuro deficit, acute, stroke  suspected. Possible new versus progressed CVA on CTA. Weakness. Aphasia. Facial droop. EXAM: MRI HEAD WITHOUT CONTRAST TECHNIQUE: Multiplanar, multiecho pulse sequences of the brain and surrounding structures were obtained without intravenous contrast. COMPARISON:  Head CT 11/17/2023. CT angiogram head/neck 11/11/2023. Brain MRI 11/11/2023. FINDINGS: Brain: No age-advanced or lobar predominant cerebral atrophy. Numerous small acute infarcts within the left cerebellar hemisphere and left superior cerebellar peduncle, the majority of which are new from the prior MRI of 11/11/2023. Several new small acute cortical infarcts are present within the right frontal, right parietal, left parietal and left occipital lobes. A new punctate acute infarct is also present within the left thalamus. Known patchy acute/early subacute infarcts elsewhere within the left frontal and left parietal lobes (MCA vascular territory). 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 Background mild multifocal T2 FLAIR hyperintense signal abnormality within the cerebral white matter, nonspecific but compatible with chronic small vessel ischemic disease. Punctate chronic microhemorrhage within the left occipital lobe, unchanged (series 10, image 25). No evidence of an intracranial mass. No extra-axial fluid collection. No midline shift. Vascular: Please  see the recent prior CTA head/neck 11/11/2023. Small foci of susceptibility-weighted signal loss along the left frontal lobe, new from the prior MRI and suspicious for emboli within distal left MCA branches (series 10, image 46). Skull and upper cervical spine: No focal worrisome marrow lesion. Incompletely assessed cervical spondylosis. Sinuses/Orbits: No mass or acute finding within the imaged orbits. Prior bilateral ocular lens replacement. Minimal mucosal thickening within the right maxillary sinus. IMPRESSION: 1. Numerous small acute infarcts within the left cerebellar  hemisphere/superior cerebellar peduncle, the majority of which are new from the prior brain MRI of 11/11/2023. Additionally, there are several new small acute cortical infarcts within the right frontal, right parietal, left parietal and left occipital lobes, as well as a new punctate acute infarct within the left thalamus. Involvement of multiple vascular territories concerning for an embolic process. New small foci of signal abnormality along the left frontal lobe suspicious for emboli within distal left middle cerebral artery branches. 2. Known patchy acute/early subacute infarcts elsewhere within the left frontal and left parietal lobes (MCA vascular territory). 3. Background mild cerebral white matter chronic small vessel ischemic disease. 4. Unchanged chronic lacunar infarct within the right caudate nucleus. Electronically Signed   By: Jackey Loge D.O.   On: 11/17/2023 16:31   DG Chest Portable 1 View Result Date: 11/17/2023 CLINICAL DATA:  Vomiting.  Weakness. EXAM: PORTABLE CHEST 1 VIEW COMPARISON:  11/10/2023 FINDINGS: Bilateral lung fields are clear. Bilateral costophrenic angles are clear. Stable cardio-mediastinal silhouette. No acute osseous abnormalities. The soft tissues are within normal limits. IMPRESSION: No active disease. Electronically Signed   By: Jules Schick M.D.   On: 11/17/2023 11:49   CT Head Wo Contrast Result Date: 11/17/2023 CLINICAL DATA:  68 year old female neurologic deficit with slurred speech, right facial droop. Recent left MCA M2 occlusion, posterior left MCA infarcts, occasional other small scattered brain infarcts on CTA, MRI. EXAM: CT HEAD WITHOUT CONTRAST TECHNIQUE: Contiguous axial images were obtained from the base of the skull through the vertex without intravenous contrast. RADIATION DOSE REDUCTION: This exam was performed according to the departmental dose-optimization program which includes automated exposure control, adjustment of the mA and/or kV according to  patient size and/or use of iterative reconstruction technique. COMPARISON:  Brain MRI 11/11/2023 and earlier FINDINGS: Brain: Posterior left MCA infarcts on MRI remain subtle by CT with no associated hemorrhage or mass effect (series 3, image 20). Small left cerebellum PICA territory infarct is now apparent on CT coronal image 52, and appears different from that on the recent MRI. No associated hemorrhage or mass effect. No acute intracranial hemorrhage identified. No midline shift, mass effect, or evidence of intracranial mass lesion. No ventriculomegaly. Vascular: Calcified atherosclerosis at the skull base. No suspicious intracranial vascular hyperdensity. Skull: Stable and intact. Sinuses/Orbits: Visualized paranasal sinuses and mastoids are stable and well aerated. Other: Mild rightward gaze. No other acute orbit or scalp soft tissue finding. IMPRESSION: 1. New or progressed Left cerebellar PICA territory infarct since the MRI on 11/11/2023. 2. Stable CT appearance of posterior left MCA territory infarcts. 3. No associated acute intracranial hemorrhage or mass effect. Electronically Signed   By: Odessa Fleming M.D.   On: 11/17/2023 10:15   [place] CORE BIOPSY (LYMPH NODES) Result Date: 11/16/2023 INDICATION: Right lower lobe lung mass, lymphadenopathy and liver lesions. Enlarged right supraclavicular lymph node targeted for biopsy. EXAM: ULTRASOUND GUIDED CORE BIOPSY OF RIGHT SUPRACLAVICULAR LYMPH NODE MEDICATIONS: None. ANESTHESIA/SEDATION: Moderate (conscious) sedation was employed during this procedure. A total of Versed  1.5 mg and Fentanyl 75 mcg was administered intravenously. Moderate Sedation Time: 19 minutes. The patient's level of consciousness and vital signs were monitored continuously by radiology nursing throughout the procedure under my direct supervision. PROCEDURE: The procedure, risks, benefits, and alternatives were explained to the patient. Questions regarding the procedure were encouraged and  answered. The patient understands and consents to the procedure. A time-out was performed prior to initiating the procedure. The right neck was prepped with chlorhexidine in a sterile fashion, and a sterile drape was applied covering the operative field. A sterile gown and sterile gloves were used for the procedure. Local anesthesia was provided with 1% Lidocaine. After localizing an enlarged right supraclavicular lymph node, 18 gauge core biopsy samples were obtained through different portions of the lymph node. A total of 5 samples were obtained with 3 submitted on saline soaked Telfa gauze and 2 in formalin. Additional ultrasound was performed after biopsy. COMPLICATIONS: None immediate. FINDINGS: Several adjacent right supraclavicular lymph nodes are identified by ultrasound. The largest measures approximately 2.2 x 1.4 x 1.7 cm. Solid core biopsy samples were obtained. IMPRESSION: Ultrasound-guided core biopsy performed of an enlarged right supraclavicular lymph node measuring 2.2 cm in greatest diameter by ultrasound. Electronically Signed   By: Irish Lack M.D.   On: 11/16/2023 16:02        Scheduled Meds:  feeding supplement  237 mL Oral TID BM   melatonin  10 mg Oral QHS   Continuous Infusions:  heparin 1,050 Units/hr (11/18/23 1410)     LOS: 0 days     Tresa Moore, MD [facility]   If 7PM-7AM, please contact night-coverage  11/18/2023, 2:37 PM

## 2023-11-18 NOTE — Progress Notes (Signed)
 Rounding Note    Patient Name: Debra [name] Date of Encounter: 11/18/2023  [facility] Cardiologist: Debbe Odea, MD   Subjective   Patient reports feeling well today with improvements in speech. Feeling some nausea on my exam. She denies chest pain, shortness of breath, palpitations, lightheadedness, dizziness, and vomiting.   Inpatient Medications    Scheduled Meds:  melatonin  10 mg Oral QHS   Continuous Infusions:  heparin 1,300 Units/hr (11/18/23 0342)   PRN Meds: acetaminophen, ondansetron **OR** ondansetron (ZOFRAN) IV, oxyCODONE, promethazine   Vital Signs    Vitals:   11/18/23 0400 11/18/23 0437 11/18/23 0600 11/18/23 0732  BP: 109/61  120/62 120/62  Pulse: 71  77 77  Resp: 12   12  Temp:  97.9 F (36.6 C)  97.9 F (36.6 C)  TempSrc:  Oral    SpO2: 99%  94% 94%  Weight:      Height:        Intake/Output Summary (Last 24 hours) at 11/18/2023 0911 Last data filed at 11/17/2023 1926 Gross per 24 hour  Intake 1094.18 ml  Output --  Net 1094.18 ml      11/17/2023    9:06 AM 11/10/2023    8:16 PM 06/24/2023    1:24 PM  Last 3 Weights  Weight (lbs) 179 lb 14.3 oz 180 lb 187 lb 6.4 oz  Weight (kg) 81.6 kg 81.647 kg 85.004 kg      Telemetry    Sinus rhythm with occasional PVCs - Personally Reviewed  Physical Exam   GEN: No acute distress.   Neck: No JVD Cardiac: RRR, no murmurs, rubs, or gallops.  Respiratory: Clear to auscultation bilaterally. GI: Soft, nontender, non-distended  MS: No edema; No deformity. Neuro:  Right sided facial droop noted, slurred speech  Psych: Normal affect   Labs    High Sensitivity Troponin:   Recent Labs  Lab 11/10/23 2305 11/11/23 0228 11/17/23 0910 11/17/23 1200 11/17/23 1829  TROPONINIHS 93* 94* 481* 579* 791*     Chemistry Recent Labs  Lab 11/12/23 0555 11/13/23 0157 11/17/23 0910  NA 137 142 139  K 3.3* 3.7 3.6  CL 103 108 102  CO2 26 27 29   GLUCOSE 96 110* 122*  BUN 9 9 15    CREATININE 0.93 0.81 0.90  CALCIUM 8.5* 8.7* 9.0  PROT 6.0* 6.2* 6.9  ALBUMIN 2.8* 2.9* 3.2*  AST 74* 68* 42*  ALT 120* 113* 67*  ALKPHOS 46 49 49  BILITOT 0.7 0.6 0.7  GFRNONAA >60 >60 >60  ANIONGAP 8 7 8     Lipids  Recent Labs  Lab 11/12/23 0556  CHOL 199  TRIG 159*  HDL 36*  LDLCALC 131*  CHOLHDL 5.5    Hematology Recent Labs  Lab 11/16/23 0341 11/17/23 0910 11/18/23 0250  WBC 11.0* 11.7* 10.0  RBC 2.59* 3.00* 2.65*  HGB 7.9* 9.2* 8.1*  HCT 23.2* 27.7* 24.0*  MCV 89.6 92.3 90.6  MCH 30.5 30.7 30.6  MCHC 34.1 33.2 33.8  RDW 14.1 14.5 14.6  PLT 246 204 183   Thyroid No results for input(s): "TSH", "FREET4" in the last 168 hours.  BNPNo results for input(s): "BNP", "PROBNP" in the last 168 hours.  DDimer No results for input(s): "DDIMER" in the last 168 hours.   Radiology    ECHOCARDIOGRAM LIMITED Result Date: 11/18/2023 IMPRESSIONS  1. Left ventricular ejection fraction, by estimation, is 60 to 65%. The left ventricle has normal function. The left ventricle has no regional  wall motion abnormalities. Left ventricular diastolic parameters were normal.  2. Right ventricular systolic function is normal. The right ventricular size is normal. Tricuspid regurgitation signal is inadequate for assessing PA pressure.  3. The mitral valve is degenerative. At least moderate mitral valve regurgitation, which may be underestimated due to jet eccentricity. No evidence of mitral stenosis.  4. Aortic valve regurgitation is not visualized. No aortic stenosis is present.  5. The inferior vena cava is normal in size with greater than 50% respiratory variability, suggesting right atrial pressure of 3 mmHg.Electronically signed by Yvonne Kendall MD Signature Date/Time: 11/18/2023/5:49:00 AM    Final    MR BRAIN WO CONTRAST Result Date: 11/17/2023 IMPRESSION: 1. Numerous small acute infarcts within the left cerebellar hemisphere/superior cerebellar peduncle, the majority of which are new from  the prior brain MRI of 11/11/2023. Additionally, there are several new small acute cortical infarcts within the right frontal, right parietal, left parietal and left occipital lobes, as well as a new punctate acute infarct within the left thalamus. Involvement of multiple vascular territories concerning for an embolic process. New small foci of signal abnormality along the left frontal lobe suspicious for emboli within distal left middle cerebral artery branches. 2. Known patchy acute/early subacute infarcts elsewhere within the left frontal and left parietal lobes (MCA vascular territory). 3. Background mild cerebral white matter chronic small vessel ischemic disease. 4. Unchanged chronic lacunar infarct within the right caudate nucleus. Electronically Signed   By: Jackey Loge D.O.   On: 11/17/2023 16:31   DG Chest Portable 1 View Result Date: 11/17/2023 IMPRESSION: No active disease. Electronically Signed   By: Jules Schick M.D.   On: 11/17/2023 11:49   CT Head Wo Contrast Result Date: 11/17/2023 IMPRESSION: 1. New or progressed Left cerebellar PICA territory infarct since the MRI on 11/11/2023. 2. Stable CT appearance of posterior left MCA territory infarcts. 3. No associated acute intracranial hemorrhage or mass effect. Electronically Signed   By: Odessa Fleming M.D.   On: 11/17/2023 10:15   [place] CORE BIOPSY (LYMPH NODES) Result Date: 11/16/2023 IMPRESSION: Ultrasound-guided core biopsy performed of an enlarged right supraclavicular lymph node measuring 2.2 cm in greatest diameter by ultrasound. Electronically Signed   By: Irish Lack M.D.   On: 11/16/2023 16:02   Cardiac Studies   11/17/2023 Echo limited 1. Left ventricular ejection fraction, by estimation, is 60 to 65%. The  left ventricle has normal function. The left ventricle has no regional  wall motion abnormalities. Left ventricular diastolic parameters were  normal.   2. Right ventricular systolic function is normal. The right ventricular   size is normal. Tricuspid regurgitation signal is inadequate for assessing  PA pressure.   3. The mitral valve is degenerative. At least moderate mitral valve  regurgitation, which may be underestimated due to jet eccentricity. No  evidence of mitral stenosis.   4. Aortic valve regurgitation is not visualized. No aortic stenosis is  present.   5. The inferior vena cava is normal in size with greater than 50%  respiratory variability, suggesting right atrial pressure of 3 mmHg.   11/11/2023 Echo complete 1. Left ventricular ejection fraction, by estimation, is 60 to 65%. The  left ventricle has normal function. The left ventricle has no regional  wall motion abnormalities. There is mild left ventricular hypertrophy.  Left ventricular diastolic parameters  are consistent with Grade I diastolic dysfunction (impaired relaxation).   2. Right ventricular systolic function is normal. The right ventricular  size is normal.  There is normal pulmonary artery systolic pressure. The  estimated right ventricular systolic pressure is 21.3 mmHg.   3. The mitral valve is normal in structure. Mild to moderate mitral valve  regurgitation. No evidence of mitral stenosis.   4. The aortic valve is normal in structure. Aortic valve regurgitation is  not visualized. No aortic stenosis is present.   5. The inferior vena cava is normal in size with greater than 50%  respiratory variability, suggesting right atrial pressure of 3 mmHg.   Patient Profile     Debra [name] is a 68 y.o. female with a hx of paroxysmal atrial fibrillation, hyperlipidemia, COPD on 2 L of home oxygen, former smoker, right breast cancer s/p lumpectomy radiation and chemo who is being seen for continued evaluation of elevated troponin.   Assessment & Plan    Elevated troponin - No known history of CAD - Troponin [phone], continue to trend until peaked - EKG without ischemic changes - Denies symptoms of angina - Limited echo with  preserved EF - Continue IV heparin for 48 hours with plan to transition back to PTA Eliquis thereafter - Suspect demand ischemia in the setting of recurrent acute CVA - No plan for invasive cardiac workup given negative EKG, lack of anginal symptoms, and significant comorbidities including presumed metastatic cancer and recurrent CVA. Consider outpatient ischemic evaluation.   Paroxysmal atrial fibrillation - EKG and telemetry show sinus rhythm - Consider reintroduction of PTA diltiazem with improvements in BP - Continue IV heparin as above with plan to transition to PTA Eliquis after 48 hours is complete  Hyperlipidemia - Most recent lipid panel with LDL 131 - LFTs elevated, will look to restart PTA atorvastatin once safe to do so from a hepatic perspective   Recurrent CVA - CT and MRI this admission with acute changes from prior, patient and family report worsening symptoms - Likely contributing to ongoing nausea/vomiting - Neurology consulted  Hx breast cancer with suspected metastasis  - New liver lesion noted on recent admission - Underwent lymph node biopsy 3/31, results pending - Likely hypercoagulable state contributing to recurrent CVA  For questions or updates, please contact [facility] Please consult [email or web address] for contact info under        Signed, Orion Crook, PA-C  11/18/2023, 9:11 AM

## 2023-11-18 NOTE — Evaluation (Signed)
 Occupational Therapy Evaluation Patient Details Name: Debra [name] MRN: [id number] DOB: Dec 29, 1955 Today's Date: 11/18/2023   History of Present Illness   Pt is a 68 year old female presenting with N/V, slurred speech and R facial droop; MRI showing new acute infarcts  PMH significant for  significant of paroxysmal atrial fibrillation, anxiety, COPD, on home O2 at 3 L/min, diastolic function, GERD, and breast cancer, not currently on chemotherapy, hypertension and dyslipidemia, recent evaluation for embolic CVA as well as liver lesion. Patient noted to have been recently admitted March 25 to March 31 for issues including intractable nausea and vomiting, acute embolic CVA, transaminitis with new liver lesions.     Clinical Impressions Chart reviewed, pt greeted semi supine in bed, noted R facial droop and dysarthric. Pt is alert and oriented x4. Pt was recently discharged from hospital where she was performing mobility at a CGA-MIN A level; prior to that pt was amb with no AD around house, rollator community distances, performing ADL with MOD I,PRN assist for IADL from husband. Pt presents with deficits in activity tolerance, balance, FMC/dexterity, BUE function, affecting safe and optimal ADL completion. Vision appears functional, will continue to assess. Bed mobility completed with CGA-supervision, STS with CGA-MIN A, amb in room with RW with CGA-MIN A. Frequent cueing required for RW technique. Impaired FNF with dexterity/coordination deficits noted in BUE, worse in LUE. Discussed discharge disposition with patient, pt is eager to return home however wants to improve function to return to PLOF. Pt is left as received, all needs met. OT will follow acutely.      If plan is discharge home, recommend the following:   A little help with walking and/or transfers;A little help with bathing/dressing/bathroom;Help with stairs or ramp for entrance;Assistance with cooking/housework;Assist for  transportation     Functional Status Assessment   Patient has had a recent decline in their functional status and demonstrates the ability to make significant improvements in function in a reasonable and predictable amount of time.     Equipment Recommendations   BSC/3in1     Recommendations for Other Services         Precautions/Restrictions   Precautions Precautions: Fall Recall of Precautions/Restrictions: Intact Restrictions Weight Bearing Restrictions Per Provider Order: No     Mobility Bed Mobility Overal bed mobility: Needs Assistance Bed Mobility: Supine to Sit, Sit to Supine     Supine to sit: Min assist, HOB elevated, Used rails Sit to supine: Contact guard assist, HOB elevated, Used rails        Transfers Overall transfer level: Needs assistance Equipment used: Rolling walker (2 wheels) Transfers: Sit to/from Stand Sit to Stand: Contact guard assist, Min assist                  Balance Overall balance assessment: Needs assistance Sitting-balance support: Feet supported Sitting balance-Leahy Scale: Good     Standing balance support: Bilateral upper extremity supported, Reliant on assistive device for balance, During functional activity Standing balance-Leahy Scale: Fair                             ADL either performed or assessed with clinical judgement   ADL Overall ADL's : Needs assistance/impaired                     Lower Body Dressing: Moderate assistance;Sitting/lateral leans   Toilet Transfer: Minimal assistance;Rolling walker (2 wheels);Ambulation Toilet Transfer Details (indicate cue type  and reason): simulated in room, intermittent vcs for technique         Functional mobility during ADLs: Minimal assistance;Rolling walker (2 wheels) (approx 15' in room, frequent vcs for RW use/technique)       Vision   Vision Assessment?: No apparent visual deficits     Perception         Praxis          Pertinent Vitals/Pain Pain Assessment Pain Assessment: No/denies pain     Extremity/Trunk Assessment Upper Extremity Assessment Upper Extremity Assessment: LUE deficits/detail;Left hand dominant;RUE deficits/detail RUE Deficits / Details: AROM appears WFL; strength grossly 4/5 throughout; mild grip strength/FMC dexterity deficits noted however pt able to perform package/container management with set up LUE Deficits / Details: impaired FNF;AROM appears WFL; strength grossly 4/5 throughout; grip strength/FMC dexterity deficits noted however pt able to perform package/container management with set up   Lower Extremity Assessment Lower Extremity Assessment: Defer to PT evaluation   Cervical / Trunk Assessment Cervical / Trunk Assessment: Normal   Communication Communication Communication: Impaired Factors Affecting Communication: Difficulty expressing self (dysarthric)   Cognition Arousal: Alert Behavior During Therapy: WFL for tasks assessed/performed Cognition: Cognition impaired           Executive functioning impairment (select all impairments): Problem solving OT - Cognition Comments: will continue to assess higher level executive functioning                 Following commands: Intact       Cueing  General Comments   Cueing Techniques: Verbal cues;Tactile cues  vss througout   Exercises Other Exercises Other Exercises: edu re: role of OT, role of rehab, discharge recommendations   Shoulder Instructions      Home Living Family/patient expects to be discharged to:: Private residence Living Arrangements: Spouse/significant other Available Help at Discharge: Family Type of Home: Mobile home Home Access: Stairs to enter Secretary/administrator of Steps: 4 Entrance Stairs-Rails: Right;Left;Can reach both Home Layout: One level     Bathroom Shower/Tub: Chief Strategy Officer: Standard     Home Equipment: Rollator (4 wheels)      Lives  With: Spouse    Prior Functioning/Environment Prior Level of Function : Independent/Modified Independent             Mobility Comments: no AD in the home, rollator community distances ADLs Comments: MOD I-I in ADL/IADL, recent increased assist following previous acute stay discharging 3/31 but was not home for long    OT Problem List: Decreased strength;Decreased activity tolerance;Decreased knowledge of use of DME or AE;Impaired balance (sitting and/or standing);Decreased range of motion   OT Treatment/Interventions: Self-care/ADL training;Therapeutic exercise;Patient/family education;DME and/or AE instruction;Therapeutic activities;Cognitive remediation/compensation      OT Goals(Current goals can be found in the care plan section)   Acute Rehab OT Goals Patient Stated Goal: improve PLOF OT Goal Formulation: With patient Time For Goal Achievement: 12/02/23 Potential to Achieve Goals: Good ADL Goals Pt Will Perform Grooming: sitting;with modified independence Pt Will Perform Lower Body Dressing: with supervision;sitting/lateral leans;sit to/from stand Pt Will Transfer to Toilet: with max assist;ambulating   OT Frequency:  Min 3X/week    Co-evaluation              AM-PAC OT "6 Clicks" Daily Activity     Outcome Measure Help from another person eating meals?: None Help from another person taking care of personal grooming?: None Help from another person toileting, which includes using toliet, bedpan, or urinal?: A  Little Help from another person bathing (including washing, rinsing, drying)?: A Little Help from another person to put on and taking off regular upper body clothing?: A Little Help from another person to put on and taking off regular lower body clothing?: A Lot 6 Click Score: 19   End of Session Equipment Utilized During Treatment: Gait belt;Rolling walker (2 wheels) Nurse Communication: Mobility status;Other (comment) (L sided IV)  Activity Tolerance:  Patient tolerated treatment well Patient left: in bed;with call bell/phone within reach;with bed alarm set  OT Visit Diagnosis: Other abnormalities of gait and mobility (R26.89);Cognitive communication deficit (R41.841) Symptoms and signs involving cognitive functions: Cerebral infarction                Time: 4098-1191 OT Time Calculation (min): 16 min Charges:  OT General Charges $OT Visit: 1 Visit OT Evaluation $OT Eval Moderate Complexity: 1 Mod  Oleta Mouse, OTD OTR/L  11/18/23, 3:21 PM

## 2023-11-18 NOTE — Plan of Care (Signed)

## 2023-11-18 NOTE — ED Notes (Signed)
Pt ambulated to bathroom and back

## 2023-11-18 NOTE — Consult Note (Signed)
 PHARMACY - ANTICOAGULATION CONSULT NOTE  Pharmacy Consult for Heparin Indication: chest pain/ACS and atrial fibrillation  Allergies  Allergen Reactions   Cephalexin Hives   Strawberry Extract Hives   Patient Measurements: Height: 5\' 4"  (162.6 cm) Weight: 81.6 kg (179 lb 14.3 oz) IBW/kg (Calculated) : 54.7 HEPARIN DW (KG): 72.3  Vital Signs: Temp: 98.3 F (36.8 C) (04/01 1934) Temp Source: Oral (04/01 1934) BP: 107/56 (04/02 0200) Pulse Rate: 76 (04/02 0200)  Labs: Recent Labs    11/16/23 0341 11/16/23 1053 11/17/23 0910 11/17/23 1200 11/17/23 1829 11/18/23 0250  HGB 7.9*  --  9.2*  --   --  8.1*  HCT 23.2*  --  27.7*  --   --  24.0*  PLT 246  --  204  --   --  183  APTT 65* 33  --   --  39* 53*  LABPROT 14.1  --  14.7  --   --   --   INR 1.1  --  1.1  --   --   --   HEPARINUNFRC 0.58  --   --   --  0.70 0.63  CREATININE  --   --  0.90  --   --   --   TROPONINIHS  --   --  481* 579* 791*  --    Estimated Creatinine Clearance: 61 mL/min (by C-G formula based on SCr of 0.9 mg/dL).  Medical History: Past Medical History:  Diagnosis Date   Anxiety    Aortic atherosclerosis (HCC)    Arthritis    Atrial fibrillation (HCC) 08/01/2020   a.) CHA2DS2-VASc = 4 (age, sex, HTN, aortic plaque). b.) chronically anticoagulated using apixaban   Breast cancer, right breast (HCC) 01/08/2021   a.) Stage IB (cT2cN0cM0) invasive mammary carcinoma of the RIGHT breast; grade I, ER/PR (+) and HER2/neu (+). Tx with neoadjuvant TCHP chemotherapy.   Carotid bruit    L --nl doppler 5/09- and again 5/13 with 0-39% stenosis bilat   Constipation    COPD (chronic obstructive pulmonary disease) (HCC)    Diastolic dysfunction 08/02/2020   a.) TTE 08/02/2020: EF 60-65%; G1DD; triv MR/AR. b.) TTE 02/05/2021: ED 55-60%; G1DD; GLS -19.0%. c.) TTE 05/07/2021: TTE 55-60%; GLS -20.3%.   Family history of brain cancer    Family history of breast cancer    Family history of kidney cancer    Family  history of lung cancer    Fatigue    Fracture of femoral neck, right (HCC) 2015   GERD (gastroesophageal reflux disease)    GI (gastrointestinal bleed)    [name]   Hepatitis    Hyperlipidemia    Hypertension    Left arm pain    Leg pain    Chronic pain R leg from injury   Long term current use of anticoagulant    a.) apixaban   OSA and COPD overlap syndrome (HCC)    a.) no nocurnal PAP therapy; does utilize supplemental oxygen.   Osteopenia    Other organic sleep disorders    Supplemental oxygen dependent    Tobacco abuse    Medications:  Apixaban 5mg  BID Last dose: last reported 3/27 in the morning, however, patient cannot remember if she took a dose after being discharged yesterday -- Per patient, she can't remember if she took her apixaban yesterday evening with all of her other medications, and unable to obtain baseline levels prior to starting infusion  Assessment: 68 year old female with history of A-fib, COPD on 3  L home O2, breast cancer on chemotherapy, HTN presenting to the emergency department for evaluation of vomiting.  Patient reports that she developed recurrent nausea with 3 episodes of vomiting earlier today leading her to call EMS.   Patient was just discharged yesterday(3/31). At that time she presented with vomiting and diarrhea and was found to have a transaminitis with liver lesions for which she had a liver biopsy performed. During her admission she was noted to have right facial weakness for which MRI demonstrated multiple small acute infarcts possibly related to embolic event in the setting of poor absorption of her Eliquis secondary to nausea and vomiting. Pharmacy has been consulted for continuous heparin infusion monitoring and dosing.  Baseline labs: Hgb 9.2 Plt 204  Goal of Therapy:  Heparin level 0.3-0.7 units/ml aPTT 66-102 seconds Monitor platelets by anticoagulation protocol: Yes  Heparin/aPTT Date/Time HL/aPTT Clinical  Assessment 4/1@1829  0.70/39 Dosing based on aPTT; SUBtherapeutic  4/2  0250 0.63 / 53 aPTT, subtherapeutic   Plan:  -- Give 2,100 heparin bolus x 1 -- Increase heparin infusion to 1,300 units/hr -- Recheck HL in 6 hrs after rate change -- Continue to monitor HL daily until correlation w/ aPTT confirmed -- Continue to monitor H&H and platelets  Thank you for allowing pharmacy to participate in this patient's care.   Otelia Sergeant, PharmD, [facility] 11/18/2023 3:35 AM

## 2023-11-18 NOTE — Progress Notes (Signed)
  Inpatient Rehab Admissions Coordinator :  Per therapy recommendations, patient was screened for CIR candidacy by Ottie Glazier RN MSN.  At this time patient appears to be a potential candidate for CIR. I will place a rehab consult per protocol for full assessment. Please call me with any questions.  Ottie Glazier RN MSN Admissions Coordinator [phone]

## 2023-11-18 NOTE — Progress Notes (Signed)
 PT Cancellation Note  Patient Details Name: Debra [name] MRN: [id number] DOB: 1956-01-11   Cancelled Treatment:    Reason Eval/Treat Not Completed: Other (comment). Pt with staff member, Pt to re-attempt as able.   Olga Coaster PT, DPT 1:47 PM,11/18/23

## 2023-11-18 NOTE — Plan of Care (Signed)

## 2023-11-18 NOTE — Consult Note (Signed)
 PHARMACY - ANTICOAGULATION CONSULT NOTE  Pharmacy Consult for Heparin Indication: chest pain/ACS and atrial fibrillation  Allergies  Allergen Reactions   Cephalexin Hives   Strawberry Extract Hives   Patient Measurements: Height: 5\' 4"  (162.6 cm) Weight: 81.6 kg (179 lb 14.3 oz) IBW/kg (Calculated) : 54.7 HEPARIN DW (KG): 72.3  Vital Signs: Temp: 98.2 F (36.8 C) (04/02 2000) Temp Source: Oral (04/02 2000) BP: 119/54 (04/02 2000) Pulse Rate: 79 (04/02 1601)  Labs: Recent Labs    11/16/23 0341 11/16/23 1053 11/17/23 0910 11/17/23 1200 11/17/23 1829 11/18/23 0250 11/18/23 1149 11/18/23 2125  HGB 7.9*  --  9.2*  --   --  8.1*  --   --   HCT 23.2*  --  27.7*  --   --  24.0*  --   --   PLT 246  --  204  --   --  183  --   --   APTT 65*   < >  --   --  39* 53* >200* 42*  LABPROT 14.1  --  14.7  --   --   --   --   --   INR 1.1  --  1.1  --   --   --   --   --   HEPARINUNFRC 0.58  --   --   --  0.70 0.63  --   --   CREATININE  --   --  0.90  --   --   --   --   --   TROPONINIHS  --    < > 481* 579* 791*  --  405*  --    < > = values in this interval not displayed.   Estimated Creatinine Clearance: 61 mL/min (by C-G formula based on SCr of 0.9 mg/dL).  Medical History: Past Medical History:  Diagnosis Date   Anxiety    Aortic atherosclerosis (HCC)    Arthritis    Atrial fibrillation (HCC) 08/01/2020   a.) CHA2DS2-VASc = 4 (age, sex, HTN, aortic plaque). b.) chronically anticoagulated using apixaban   Breast cancer, right breast (HCC) 01/08/2021   a.) Stage IB (cT2cN0cM0) invasive mammary carcinoma of the RIGHT breast; grade I, ER/PR (+) and HER2/neu (+). Tx with neoadjuvant TCHP chemotherapy.   Carotid bruit    L --nl doppler 5/09- and again 5/13 with 0-39% stenosis bilat   Constipation    COPD (chronic obstructive pulmonary disease) (HCC)    Diastolic dysfunction 08/02/2020   a.) TTE 08/02/2020: EF 60-65%; G1DD; triv MR/AR. b.) TTE 02/05/2021: ED 55-60%; G1DD;  GLS -19.0%. c.) TTE 05/07/2021: TTE 55-60%; GLS -20.3%.   Family history of brain cancer    Family history of breast cancer    Family history of kidney cancer    Family history of lung cancer    Fatigue    Fracture of femoral neck, right (HCC) 2015   GERD (gastroesophageal reflux disease)    GI (gastrointestinal bleed)    [name]   Hepatitis    Hyperlipidemia    Hypertension    Left arm pain    Leg pain    Chronic pain R leg from injury   Long term current use of anticoagulant    a.) apixaban   OSA and COPD overlap syndrome (HCC)    a.) no nocurnal PAP therapy; does utilize supplemental oxygen.   Osteopenia    Other organic sleep disorders    Supplemental oxygen dependent    Tobacco abuse  Medications:  Apixaban 5mg  BID Last dose: last reported 3/27 in the morning, however, patient cannot remember if she took a dose after being discharged yesterday -- Per patient, she can't remember if she took her apixaban yesterday evening with all of her other medications, and unable to obtain baseline levels prior to starting infusion  Assessment: 68 year old female with history of A-fib, COPD on 3 L home O2, breast cancer on chemotherapy, HTN presenting to the emergency department for evaluation of vomiting.  Patient reports that she developed recurrent nausea with 3 episodes of vomiting earlier today leading her to call EMS.   Patient was just discharged yesterday(3/31). At that time she presented with vomiting and diarrhea and was found to have a transaminitis with liver lesions for which she had a liver biopsy performed. During her admission she was noted to have right facial weakness for which MRI demonstrated multiple small acute infarcts possibly related to embolic event in the setting of poor absorption of her Eliquis secondary to nausea and vomiting. Pharmacy has been consulted for continuous heparin infusion monitoring and dosing.  Baseline labs: Hgb 9.2 Plt 204  Goal of  Therapy:  Heparin level 0.3-0.7 units/ml aPTT 66-102 seconds Monitor platelets by anticoagulation protocol: Yes  Heparin/aPTT Date/Time HL/aPTT Clinical Assessment 4/1@1829  0.70/39 Dosing based on aPTT; SUBtherapeutic  4/2 @0250  0.63 / 53 aPTT, SUBtherapeutic 4/2@1149  -- / >200 aPTT, SUPRAtherapeutic 4/2@2125  --/42   aPTT subtherapeutic.    Plan:  aPTT is subtherapeutic. Will give heparin bolus of 2100 units x 1 and increase heparin infusion to 1200 units/hr. Recheck aPTT/HL/CBC in 6 hours.   Thank you for allowing pharmacy to participate in this patient's care.   Ronnald Ramp, PharmD Clinical Pharmacist 11/18/2023 9:58 PM

## 2023-11-18 NOTE — ED Notes (Signed)
 Lab called to straight stick pt. Spoke with Tyron Russell.

## 2023-11-19 ENCOUNTER — Inpatient Hospital Stay
Admit: 2023-11-19 | Discharge: 2023-11-19 | Disposition: A | Discharge status: Home / Self Care | Attending: Internal Medicine

## 2023-11-19 DIAGNOSIS — R7989 Other specified abnormal findings of blood chemistry: Secondary | ICD-10-CM

## 2023-11-19 DIAGNOSIS — Z853 Personal history of malignant neoplasm of breast: Secondary | ICD-10-CM

## 2023-11-19 DIAGNOSIS — I639 Cerebral infarction, unspecified: Secondary | ICD-10-CM | POA: Diagnosis not present

## 2023-11-19 DIAGNOSIS — R112 Nausea with vomiting, unspecified: Secondary | ICD-10-CM | POA: Diagnosis not present

## 2023-11-19 DIAGNOSIS — R110 Nausea: Secondary | ICD-10-CM

## 2023-11-19 LAB — APTT
aPTT: 65 s — ABNORMAL HIGH (ref 24–36)
aPTT: 61 s — ABNORMAL HIGH (ref 24–36)
aPTT: 57 s — ABNORMAL HIGH (ref 24–36)

## 2023-11-19 LAB — CBC
HCT: 24 % — ABNORMAL LOW (ref 36.0–46.0)
Hemoglobin: 7.8 g/dL — ABNORMAL LOW (ref 12.0–15.0)
MCH: 30.2 pg (ref 26.0–34.0)
MCHC: 32.5 g/dL (ref 30.0–36.0)
MCV: 93 fL (ref 80.0–100.0)
Platelets: 184 10*3/uL (ref 150–400)
RBC: 2.58 MIL/uL — ABNORMAL LOW (ref 3.87–5.11)
RDW: 14.5 % (ref 11.5–15.5)
WBC: 9.3 10*3/uL (ref 4.0–10.5)
nRBC: 0 % (ref 0.0–0.2)

## 2023-11-19 LAB — VITAMIN B12: Vitamin B-12: 7289 pg/mL — ABNORMAL HIGH (ref 180–914)

## 2023-11-19 LAB — HEPARIN LEVEL (UNFRACTIONATED)
Heparin Unfractionated: 0.53 [IU]/mL (ref 0.30–0.70)
Heparin Unfractionated: 0.62 [IU]/mL (ref 0.30–0.70)

## 2023-11-19 MED ORDER — MIDAZOLAM HCL 2 MG/2ML IJ SOLN
INTRAMUSCULAR | Status: AC | PRN
  Administered 2023-11-19: 1 mg via INTRAVENOUS

## 2023-11-19 MED ORDER — LIDOCAINE HCL (PF) 1 % IJ SOLN
10.0000 mL | Freq: Once | INTRAMUSCULAR | Status: AC
  Administered 2023-11-19: 10 mL via INTRADERMAL
  Filled 2023-11-19: qty 10

## 2023-11-19 MED ORDER — FENTANYL CITRATE (PF) 100 MCG/2ML IJ SOLN
INTRAMUSCULAR | Status: AC | PRN
  Administered 2023-11-19: 50 ug via INTRAVENOUS

## 2023-11-19 MED ORDER — MIDAZOLAM HCL 2 MG/2ML IJ SOLN
INTRAMUSCULAR | Status: AC
  Filled 2023-11-19: qty 2

## 2023-11-19 MED ORDER — FENTANYL CITRATE (PF) 100 MCG/2ML IJ SOLN
INTRAMUSCULAR | Status: AC
Start: 2023-11-19 — End: 2023-11-20
  Filled 2023-11-19: qty 2

## 2023-11-19 MED ORDER — HEPARIN BOLUS VIA INFUSION
1100.0000 [IU] | Freq: Once | INTRAVENOUS | Status: AC
  Administered 2023-11-19: 1100 [IU] via INTRAVENOUS
  Filled 2023-11-19: qty 1100

## 2023-11-19 MED ORDER — HEPARIN BOLUS VIA INFUSION
1100.0000 [IU] | Freq: Once | INTRAVENOUS | Status: AC
Start: 2023-11-19 — End: 2023-11-19
  Administered 2023-11-19: 1100 [IU] via INTRAVENOUS
  Filled 2023-11-19: qty 1100

## 2023-11-19 NOTE — Consult Note (Signed)
 PHARMACY - ANTICOAGULATION CONSULT NOTE  Pharmacy Consult for Heparin Indication: chest pain/ACS and atrial fibrillation  Allergies  Allergen Reactions   Cephalexin Hives   Strawberry Extract Hives   Patient Measurements: Height: 5\' 4"  (162.6 cm) Weight: 81.6 kg (179 lb 14.3 oz) IBW/kg (Calculated) : 54.7 HEPARIN DW (KG): 72.3  Vital Signs: Temp: 97.9 F (36.6 C) (04/03 0400) Temp Source: Oral (04/03 0400) BP: 113/59 (04/03 0400)  Labs: Recent Labs    11/17/23 0910 11/17/23 1200 11/17/23 1829 11/18/23 0250 11/18/23 1149 11/18/23 2125 11/19/23 0413  HGB 9.2*  --   --  8.1*  --   --  7.8*  HCT 27.7*  --   --  24.0*  --   --  24.0*  PLT 204  --   --  183  --   --  184  APTT  --   --  39* 53* >200* 42* 65*  LABPROT 14.7  --   --   --   --   --   --   INR 1.1  --   --   --   --   --   --   HEPARINUNFRC  --   --  0.70 0.63  --   --  0.53  CREATININE 0.90  --   --   --   --   --   --   TROPONINIHS 481* 579* 791*  --  405*  --   --    Estimated Creatinine Clearance: 61 mL/min (by C-G formula based on SCr of 0.9 mg/dL).  Medical History: Past Medical History:  Diagnosis Date   Anxiety    Aortic atherosclerosis (HCC)    Arthritis    Atrial fibrillation (HCC) 08/01/2020   a.) CHA2DS2-VASc = 4 (age, sex, HTN, aortic plaque). b.) chronically anticoagulated using apixaban   Breast cancer, right breast (HCC) 01/08/2021   a.) Stage IB (cT2cN0cM0) invasive mammary carcinoma of the RIGHT breast; grade I, ER/PR (+) and HER2/neu (+). Tx with neoadjuvant TCHP chemotherapy.   Carotid bruit    L --nl doppler 5/09- and again 5/13 with 0-39% stenosis bilat   Constipation    COPD (chronic obstructive pulmonary disease) (HCC)    Diastolic dysfunction 08/02/2020   a.) TTE 08/02/2020: EF 60-65%; G1DD; triv MR/AR. b.) TTE 02/05/2021: ED 55-60%; G1DD; GLS -19.0%. c.) TTE 05/07/2021: TTE 55-60%; GLS -20.3%.   Family history of brain cancer    Family history of breast cancer    Family  history of kidney cancer    Family history of lung cancer    Fatigue    Fracture of femoral neck, right (HCC) 2015   GERD (gastroesophageal reflux disease)    GI (gastrointestinal bleed)    [name]   Hepatitis    Hyperlipidemia    Hypertension    Left arm pain    Leg pain    Chronic pain R leg from injury   Long term current use of anticoagulant    a.) apixaban   OSA and COPD overlap syndrome (HCC)    a.) no nocurnal PAP therapy; does utilize supplemental oxygen.   Osteopenia    Other organic sleep disorders    Supplemental oxygen dependent    Tobacco abuse    Medications:  Apixaban 5mg  BID Last dose: last reported 3/27 in the morning, however, patient cannot remember if she took a dose after being discharged yesterday -- Per patient, she can't remember if she took her apixaban yesterday evening with all of  her other medications, and unable to obtain baseline levels prior to starting infusion  Assessment: 68 year old female with history of A-fib, COPD on 3 L home O2, breast cancer on chemotherapy, HTN presenting to the emergency department for evaluation of vomiting.  Patient reports that she developed recurrent nausea with 3 episodes of vomiting earlier today leading her to call EMS.   Patient was just discharged yesterday(3/31). At that time she presented with vomiting and diarrhea and was found to have a transaminitis with liver lesions for which she had a liver biopsy performed. During her admission she was noted to have right facial weakness for which MRI demonstrated multiple small acute infarcts possibly related to embolic event in the setting of poor absorption of her Eliquis secondary to nausea and vomiting. Pharmacy has been consulted for continuous heparin infusion monitoring and dosing.  Baseline labs: Hgb 9.2 Plt 204  Goal of Therapy:  Heparin level 0.3-0.7 units/ml aPTT 66-102 seconds Monitor platelets by anticoagulation protocol:  Yes  Heparin/aPTT Date/Time HL/aPTT Clinical Assessment 4/1@1829  0.70/39 Dosing based on aPTT; SUBtherapeutic  4/2 @0250  0.63 / 53 aPTT, SUBtherapeutic 4/2@1149  -- / >200 aPTT, SUPRAtherapeutic 4/2@2125  --/42   aPTT subtherapeutic 4/3  0413 0.53 / 65 aPTT slightly subtherapeutic   Plan:  Increase increase heparin infusion to 1300 units/hr.  Recheck aPTT/HL in 6 hours. CBC daily while on heparin   Thank you for allowing pharmacy to participate in this patient's care.   Otelia Sergeant, PharmD, [facility] 11/19/2023 5:57 AM

## 2023-11-19 NOTE — Consult Note (Signed)
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 regurgitant velocity is 2.02 m/s, and  with an assumed right atrial pressure of 5 mmHg, the estimated right ventricular systolic pressure is 21.3 mmHg. Left Atrium: Left atrial size was normal in size. Right Atrium: Right atrial size was normal in size. Pericardium: There is no evidence of pericardial effusion. Mitral Valve: The mitral valve is normal in structure. Mild to moderate mitral valve regurgitation. No evidence of mitral valve stenosis. Tricuspid Valve: The tricuspid valve is normal in structure. Tricuspid valve regurgitation is not demonstrated. No evidence of tricuspid stenosis. Aortic Valve: The aortic valve is normal in structure. Aortic valve regurgitation is not visualized. No aortic stenosis is present. Pulmonic Valve: The pulmonic valve was normal in structure. Pulmonic valve regurgitation is not visualized. No evidence of pulmonic stenosis. Aorta: The aortic root is normal in size and structure. Venous: The inferior vena cava is normal in size with greater than 50% respiratory variability, suggesting right atrial pressure of 3 mmHg.  IAS/Shunts: No atrial level shunt detected by color flow Doppler. Additional Comments: 3D was performed not requiring image post processing on an independent workstation and was indeterminate.  LEFT VENTRICLE PLAX 2D LVIDd:         4.90 cm   Diastology LVIDs:         3.10 cm   LV e' medial:    9.36 cm/s LV PW:         1.00 cm   LV E/e' medial:  9.1 LV IVS:        1.00 cm   LV e' lateral:   9.32 cm/s LVOT diam:     2.00 cm   LV E/e' lateral: 9.2 LV SV:         56 LV SV Index:   30 LVOT Area:     3.14 cm  RIGHT VENTRICLE RV Basal diam:  3.30 cm RV S prime:     18.33 cm/s TAPSE (M-mode): 3.2 cm LEFT ATRIUM             Index        RIGHT ATRIUM           Index LA diam:        3.90 cm 2.08 cm/m  RA Area:     11.60 cm LA Vol (A2C):   50.9 ml 27.21 ml/m  RA Volume:   27.60 ml  14.75 ml/m LA Vol (A4C):   38.2 ml 20.42 ml/m LA Biplane Vol: 44.4 ml 23.73 ml/m  AORTIC VALVE LVOT Vmax:   98.00 cm/s LVOT Vmean:  64.000 cm/s LVOT VTI:    0.179 m  AORTA Ao Root diam: 3.30 cm MITRAL VALVE                TRICUSPID VALVE MV Area (PHT): 3.65 cm     TR Peak grad:   16.3 mmHg MV Decel Time: 208 msec     TR Vmax:        202.00 cm/s MR Peak grad: 82.4 mmHg MR Vmax:      454.00 cm/s   SHUNTS MV E velocity: 85.55 cm/s   Systemic VTI:  0.18 m MV A velocity: 123.50 cm/s  Systemic Diam: 2.00 cm MV E/A ratio:  0.69 Julien Nordmann MD Electronically signed by Julien Nordmann MD Signature Date/Time: 11/12/2023/1:59:01 PM    Final    MR ABDOMEN MRCP W WO CONTAST Result Date: 11/12/2023 CLINICAL DATA:  Evaluate liver lesions.  Chest mass. EXAM: MRI ABDOMEN WITHOUT AND WITH CONTRAST (INCLUDING MRCP) TECHNIQUE: Multiplanar multisequence MR imaging of the abdomen was performed both before and after the administration of intravenous contrast. Heavily T2-weighted images of the biliary and pancreatic ducts were obtained, and three-dimensional MRCP images were rendered by post processing. CONTRAST:  8mL GADAVIST GADOBUTROL 1 MMOL/ML IV SOLN  COMPARISON:  CT AP 11/10/2023 FINDINGS: Lower chest: Small right pleural effusion. Partially visualized mass within the superior segment of right lower lobe noted. Hepatobiliary: There are multifocal bilobar, ill-defined areas of mild increased T2 signal noted within both lobes of liver which show hypoenhancement on the postcontrast images and restricted diffusion. At least 5 lesions are noted involving both lobes. The largest lesion is in segment 5 measuring 1.1 cm, image 17/8. Lesion in segment 2/3 measures 6 mm, image 10/8. Status post cholecystectomy. Mild intrahepatic bile duct dilatation. Fusiform dilatation of the common bile duct which measures 1.3 cm proximally. No signs of choledocholithiasis or mass. Pancreas:  No main duct dilatation, inflammation or mass identified. Spleen: Within the proximal portion of the spleen there are 2 wedge-shaped areas of peripheral hypoenhancement, image 25/31. Adrenals/Urinary Tract: There is a 1.3 cm nodule in the left adrenal gland which is indeterminate with only mild loss of signal on the out of phase sequences, image 29/9. This is a new finding when compared with the remote CT of the abdomen pelvis from 09/17/2020. Normal right adrenal gland. No kidney mass or signs of obstructive uropathy. Stomach/Bowel: Visualized portions within the abdomen are unremarkable. Vascular/Lymphatic: No aneurysm. Aortic atherosclerosis. No adenopathy. Other:  No ascites. Musculoskeletal: Multifocal abnormal areas of enhancement are identified throughout the visualized portions of the thoracic and lumbar spine which are concerning for osseous metastasis. IMPRESSION: 1. There are multifocal bilobar, ill-defined areas of mild increased T2 signal noted within both lobes of liver which show hypoenhancement on the postcontrast images and restricted diffusion. At least 5 lesions are noted involving both lobes. 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 2. Multifocal abnormal areas of  enhancement are identified throughout the visualized portions of the thoracic and lumbar spine which are concerning for osseous metastasis. 3. There are 2 wedge-shaped areas of peripheral hypoenhancement within the proximal portion of the spleen which are concerning for splenic infarcts. 4. There is a 1.3  cm nodule in the left adrenal gland which is indeterminate with only mild loss of signal on the out of phase sequences. This is a new finding when compared with the remote CT of the abdomen pelvis from 09/17/2020. Differential considerations include lipid poor adenoma versus a adrenal metastasis. 5. Small right pleural effusion. 6. Partially visualized mass within the superior segment of right lower lobe. 7. Status post cholecystectomy. Mild intrahepatic bile duct dilatation. Fusiform dilatation of the common bile duct which measures 1.3 cm proximally. No signs of choledocholithiasis or mass. Electronically Signed   By: Signa Kell M.D.   On: 11/12/2023 05:07   MR 3D Recon At Scanner Result Date: 11/12/2023 CLINICAL DATA:  Evaluate liver lesions.  Chest mass. EXAM: MRI ABDOMEN WITHOUT AND WITH CONTRAST (INCLUDING MRCP) TECHNIQUE: Multiplanar multisequence MR imaging of the abdomen was performed both before and after the administration of intravenous contrast. Heavily T2-weighted images of the biliary and pancreatic ducts were obtained, and three-dimensional MRCP images were rendered by post processing. CONTRAST:  8mL GADAVIST GADOBUTROL 1 MMOL/ML IV SOLN COMPARISON:  CT AP 11/10/2023 FINDINGS: Lower chest: Small right pleural effusion. Partially visualized mass within the superior segment of right lower lobe noted. Hepatobiliary: There are multifocal bilobar, ill-defined areas of mild increased T2 signal noted within both lobes of liver which show hypoenhancement on the postcontrast images and restricted diffusion. At least 5 lesions are noted involving both lobes. The largest lesion is in segment 5 measuring  1.1 cm, image 17/8. Lesion in segment 2/3 measures 6 mm, image 10/8. Status post cholecystectomy. Mild intrahepatic bile duct dilatation. Fusiform dilatation of the common bile duct which measures 1.3 cm proximally. No signs of choledocholithiasis or mass. Pancreas:  No main duct dilatation, inflammation or mass identified. Spleen: Within the proximal portion of the spleen there are 2 wedge-shaped areas of peripheral hypoenhancement, image 25/31. Adrenals/Urinary Tract: There is a 1.3 cm nodule in the left adrenal gland which is indeterminate with only mild loss of signal on the out of phase sequences, image 29/9. This is a new finding when compared with the remote CT of the abdomen pelvis from 09/17/2020. Normal right adrenal gland. No kidney mass or signs of obstructive uropathy. Stomach/Bowel: Visualized portions within the abdomen are unremarkable. Vascular/Lymphatic: No aneurysm. Aortic atherosclerosis. No adenopathy. Other:  No ascites. Musculoskeletal: Multifocal abnormal areas of enhancement are identified throughout the visualized portions of the thoracic and lumbar spine which are concerning for osseous metastasis. IMPRESSION: 1. There are multifocal bilobar, ill-defined areas of mild increased T2 signal noted within both lobes of liver which show hypoenhancement on the postcontrast images and restricted diffusion. At least 5 lesions are noted involving both lobes. 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 2. Multifocal abnormal areas of enhancement are identified throughout the visualized portions of the thoracic and lumbar spine which are concerning for osseous metastasis. 3. There are 2 wedge-shaped areas of peripheral hypoenhancement within the proximal portion of the spleen which are concerning for splenic infarcts. 4. There is a 1.3 cm nodule in the left adrenal gland which is indeterminate with only mild loss of signal on the out of phase sequences. This is a new finding when compared with the  remote CT of the abdomen pelvis from 09/17/2020. Differential considerations include lipid poor adenoma versus a adrenal metastasis. 5. Small right pleural effusion. 6. Partially visualized mass within the superior segment of right lower lobe. 7. Status post cholecystectomy. Mild intrahepatic bile duct dilatation. Fusiform dilatation of the common bile duct  which measures 1.3 cm proximally. No signs of choledocholithiasis or mass. Electronically Signed   By: Signa Kell M.D.   On: 11/12/2023 05:07   CT ANGIO HEAD NECK W WO CM Addendum Date: 11/11/2023 ADDENDUM REPORT: 11/11/2023 23:37 ADDENDUM: Findings discussed with Debra Milling, NP via telephone at 10:33 p.m. Electronically Signed   By: Feliberto Harts M.D.   On: 11/11/2023 23:37   Result Date: 11/11/2023 CLINICAL DATA:  Stroke/TIA, determine embolic source EXAM: CT ANGIOGRAPHY HEAD AND NECK WITH AND WITHOUT CONTRAST TECHNIQUE: Multidetector CT imaging of the head and neck was performed using the standard protocol during bolus administration of intravenous contrast. Multiplanar CT image reconstructions and MIPs were obtained to evaluate the vascular anatomy. Carotid stenosis measurements (when applicable) are obtained utilizing NASCET criteria, using the distal internal carotid diameter as the denominator. RADIATION DOSE REDUCTION: This exam was performed according to the departmental dose-optimization program which includes automated exposure control, adjustment of the mA and/or kV according to patient size and/or use of iterative reconstruction technique. CONTRAST:  75mL OMNIPAQUE IOHEXOL 350 MG/ML SOLN COMPARISON:  Same day MRI head. FINDINGS: CTA NECK FINDINGS Aortic arch: Aortic atherosclerosis. 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 Great vessel origins are patent. Right carotid system: Atherosclerosis at the carotid bifurcation without greater than 50% stenosis. Left carotid system: Atherosclerosis at  the carotid bifurcation without greater than 50% stenosis. Vertebral arteries: Codominant. No evidence of dissection, stenosis (50% or greater), or occlusion. Skeleton: No evidence of acute abnormality on limited assessment. Multilevel degenerative change. Other neck: Approximately 2.3 cm left thyroid nodule. Upper chest: Please see same day CT chest for intrathoracic findings. Emphysema. Review of the MIP images confirms the above findings CTA HEAD FINDINGS Anterior circulation: Bilateral intracranial ICAs are patent. Approximately 2 mm medially directed outpouching arising from the right cavernous ICA, which could represent an infundibulum or aneurysm. Bilateral M1 MCAs are patent. 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 Bilateral ACAs are patent without proximal hemodynamically significant stenosis. Posterior circulation: Bilateral intradural vertebral arteries, basilar artery and bilateral posterior cerebral arteries are patent without proximal hemodynamically significant stenosis. Venous sinuses: As permitted by contrast timing, patent. Review of the MIP images confirms the above findings The ordering provider has been paged at the time of dictation for call of report. IMPRESSION: 1. 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 2. Approximately 2 mm medially directed outpouching arising from the right cavernous ICA, which could represent an infundibulum or aneurysm. 3. Please see same day CT chest for intrathoracic findings. 4. Approximately 2.3 cm left thyroid nodule. Recommend thyroid US (ref: J Am Coll Radiol. 2015 Feb;12(2): 143-50). 5. Aortic Atherosclerosis (ICD10-I70.0) and Emphysema (ICD10-J43.9). Electronically Signed: By: Feliberto Harts M.D. On: 11/11/2023 23:14   MR BRAIN W WO CONTRAST Result Date: 11/11/2023 CLINICAL DATA:  right facial weakness, malignancy also on the ddx EXAM: MRI HEAD WITHOUT AND WITH CONTRAST TECHNIQUE: Multiplanar, multiecho pulse  sequences of the brain and surrounding structures were obtained without and with intravenous contrast. CONTRAST:  8mL GADAVIST GADOBUTROL 1 MMOL/ML IV SOLN COMPARISON:  CT head November 11, 2023. 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 Mild edema without mass effect. No midline shift. Probable additional punctate acute infarct in the inferior left cerebellum. No evidence of acute hemorrhage, mass lesion, or hydrocephalus. Vascular: Major arterial flow voids are maintained at the skull base. Skull and upper cervical spine: Normal  marrow signal. Sinuses/Orbits: Clear sinuses.  No acute orbital findings. IMPRESSION: 1. 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 Probable additional punctate acute infarct in the inferior left cerebellum. These results will be called to the ordering clinician or representative by the Radiologist Assistant, and communication documented in the PACS or [employer]. Electronically Signed   By: Feliberto Harts M.D.   On: 11/11/2023 20:27   CT CHEST W CONTRAST Result Date: 11/11/2023 CLINICAL DATA:  Liver lesions with history of breast cancer. EXAM: CT CHEST WITH CONTRAST TECHNIQUE: Multidetector CT imaging of the chest was performed during intravenous contrast administration. RADIATION DOSE REDUCTION: This exam was performed according to the departmental dose-optimization program which includes automated exposure control, adjustment of the mA and/or kV according to patient size and/or use of iterative reconstruction technique. CONTRAST:  75mL OMNIPAQUE IOHEXOL 300 MG/ML  SOLN COMPARISON:  CT abdomen and pelvis 11/10/2023. FINDINGS: Cardiovascular: Heart is borderline enlarged/mildly enlarged. Aorta is normal in size. There is no pericardial effusion. There are atherosclerotic calcifications of the aorta. Mediastinum/Nodes: There  is a heterogeneous left thyroid nodule attaining cystic and solid components measuring 2.6 x 1.5 cm. There are multiple enlarged right paratracheal lymph nodes measuring up to 2.0 x 2.9 cm. Enlarged subcarinal lymph node measures 1.2 cm short axis. There is a prominent right lower esophageal lymph node measuring 8 mm short axis. There are multiple enlarged right hilar lymph nodes measuring up to 1.4 x 1.8 cm. Visualized esophagus is within normal limits. Lungs/Pleura: Mild emphysematous changes are present. There is scarring in both lung apices. Multifocal airspace disease is seen throughout the right lower lobe, predominantly posteriorly and peripherally. Some areas are slightly nodular for example superior segment measuring 3.2 by 2.0 cm. A small air-fluid level seen in this region. No other air-fluid levels are seen. Additional nodular density seen in the right lower lobe image 5/92 measuring 9 mm. Smaller nodules are seen scattered throughout the right lower lobe measuring 5 mm or less. There is a trace right pleural effusion. There is a right middle lobe nodule measuring 5 mm image 5/122. There are prominent intrapulmonary lymph nodes along the right major fissure. There are 2 mm nodular densities in the left upper lobe. Left lung is otherwise clear. Upper Abdomen: Subcentimeter hypodensities in the liver and left adrenal mass are unchanged. Please see CT abdomen and pelvis performed same day for further description. Musculoskeletal: There is anterior intramuscular chest wall edema on the right. Focal density is seen in the medial right breast extending to the skin surface measuring 1.2 x 2.3 cm image 3/81. There some associated diffuse right breast skin thickening. No acute fracture. There is a single 5 mm sclerotic density in the T3 vertebral body. IMPRESSION: 1. Multifocal airspace disease throughout the right lower lobe, predominantly posteriorly and peripherally. Single area is slightly nodular in the  superior segment containing an air-fluid level worrisome for necrotic pneumonia or necrotic nodule. Findings may be infectious/inflammatory, but neoplasm not excluded. 2. Trace right pleural effusion. 3. Mediastinal and right hilar lymphadenopathy. 4. Additional right lower lobe and right middle lobe pulmonary nodules measuring 5 mm. 5. Left thyroid nodule measuring 2.6 cm. Recommend further evaluation with ultrasound. 6. Focal density in the medial right breast extending to the skin surface measuring 1.2 x 2.3 cm. Correlate with physical exam. 7. Single 5 mm sclerotic density in the T3 vertebral body. Aortic Atherosclerosis (ICD10-I70.0) and Emphysema (ICD10-J43.9). Electronically Signed  By: Darliss Cheney M.D.   On: 11/11/2023 17:53   CT HEAD WO CONTRAST ( ) Result Date: 11/11/2023 CLINICAL DATA:  Right facial weakness. EXAM: CT HEAD WITHOUT CONTRAST TECHNIQUE: Contiguous axial images were obtained from the base of the skull through the vertex without intravenous contrast. RADIATION DOSE REDUCTION: This exam was performed according to the departmental dose-optimization program which includes automated exposure control, adjustment of the mA and/or kV according to patient size and/or use of iterative reconstruction technique. COMPARISON:  Head CT 07/21/2022 FINDINGS: Brain: There are new small hypodensities in the left frontoparietal white matter at the level of the posterior centrum semiovale (series 3, image 20), and there is a new subcentimeter focus of cortical hypodensity at the level of the left parietal operculum (series 6, image 46), suspicious for recent infarcts. No intracranial hemorrhage, mass, midline shift, or extra-axial fluid collection is identified. Cerebral volume is normal. The ventricles are normal in size. Vascular: No hyperdense vessel. Skull: No acute fracture or suspicious lesion. Sinuses/Orbits: Visualized paranasal sinuses and mastoid air cells are clear. Bilateral cataract  extraction. Other: None. 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 A brain MRI is in progress and will provide further evaluation. Electronically Signed   By: Sebastian Ache M.D.   On: 11/11/2023 15:13   DG Chest 2 View Result Date: 11/10/2023 CLINICAL DATA:  dyspnea EXAM: CHEST - 2 VIEW COMPARISON:  Chest x-ray 07/21/2022, chest x-ray 02/15/2021 FINDINGS: The heart and mediastinal contours are unchanged. Prominent hilar vasculature. No focal consolidation. No pulmonary edema. No pleural effusion. No pneumothorax. No acute osseous abnormality. IMPRESSION: Prominent hilar vasculature suggestive of pulmonary venous congestion. Underlying lymph nodes not excluded. Electronically Signed   By: Tish Frederickson M.D.   On: 11/10/2023 23:50   CT ABDOMEN PELVIS W CONTRAST Result Date: 11/10/2023 CLINICAL DATA:  Acute nonlocalized abdominal pain. Vomiting and diarrhea. EXAM: CT ABDOMEN AND PELVIS WITH CONTRAST TECHNIQUE: Multidetector CT imaging of the abdomen and pelvis was performed using the standard protocol following bolus administration of intravenous contrast. RADIATION DOSE REDUCTION: This exam was performed according to the departmental dose-optimization program which includes automated exposure control, adjustment of the mA and/or kV according to patient size and/or use of iterative reconstruction technique. CONTRAST:  OMNIPAQUE IOHEXOL 300 MG/ML  SOLN COMPARISON:  None Available. FINDINGS: Lower chest: Focal consolidation suggested in the right lung base with small right pleural effusion. This may represent pneumonia or less likely aspiration. Emphysematous changes in the lungs. Hepatobiliary: Multiple scattered low-attenuation lesions in the liver, most are subcentimeter in size. Largest is in the medial segment left lobe measuring 1.3 cm in diameter. Enhancement pattern is indeterminate. Consider follow-up MRI for further characterization. The  gallbladder is not visualized, likely surgically absent. Mild intrahepatic bile duct dilatation is most likely due to postoperative change. No common duct stones are identified. Pancreas: Unremarkable. No pancreatic ductal dilatation or surrounding inflammatory changes. Spleen: Segmental low-attenuation in the upper spleen likely represents a splenic infarct. Spleen size is normal. Adrenals/Urinary Tract: 1.3 cm diameter left adrenal gland nodule with density measurement of 69 Hounsfield units. Renal nephrograms are symmetrical. No solid mass identified. No hydronephrosis or hydroureter. Bladder is normal. Stomach/Bowel: Stomach is within normal limits. Appendix appears normal. No evidence of bowel wall thickening, distention, or inflammatory changes. Vascular/Lymphatic: Aortic atherosclerosis. No enlarged abdominal or pelvic lymph nodes. Reproductive: Uterus and bilateral adnexa are unremarkable. Other: No abdominal wall hernia or abnormality. No abdominopelvic ascites. Musculoskeletal: Postoperative fixation of the  right hip. No acute bony abnormalities. IMPRESSION: 1. Nonspecific low-attenuation lesions in the liver, largest measuring 1.3 cm diameter. Enhancement pattern is indeterminate. Consider follow-up MRI for further characterization. 2. Mild intrahepatic bile duct dilatation is likely postoperative in the setting of cholecystectomy. 3. Left adrenal mass measuring 1.3 cm, probable benign adenoma. Recommend follow-up adrenal washout CT in 1 year. If stable for = 1 year, no further follow-up imaging. JACR 2017 Aug; 14(8):1038-44, JCAT 2016 Mar-Apr; 40(2):194-200, Urol J 2006 Spring; 3(2):71-4. 4. Aortic atherosclerosis. 5. Focal area of consolidation suggested in the right lung base with small right pleural effusion, likely pneumonia. Electronically Signed   By: Burman Nieves M.D.   On: 11/10/2023 23:30    Labs:  CBC: Recent Labs    11/16/23 0341 11/17/23 0910 11/18/23 0250 11/19/23 0413  WBC  11.0* 11.7* 10.0 9.3  HGB 7.9* 9.2* 8.1* 7.8*  HCT 23.2* 27.7* 24.0* 24.0*  PLT 246 204 183 184    COAGS: Recent Labs    11/13/23 0157 11/13/23 0853 11/16/23 0341 11/16/23 1053 11/17/23 0910 11/17/23 1829 11/18/23 0250 11/18/23 1149 11/18/23 2125 11/19/23 0413  INR 1.6*  --  1.1  --  1.1  --   --   --   --   --   APTT 56*   < > 65*   < >  --    < > 53* >200* 42* 65*   < > = values in this interval not displayed.    BMP: Recent Labs    11/11/23 0041 11/12/23 0555 11/13/23 0157 11/17/23 0910  NA 134* 137 142 139  K 3.7 3.3* 3.7 3.6  CL 100 103 108 102  CO2 24 26 27 29   GLUCOSE 85 96 110* 122*  BUN 13 9 9 15   CALCIUM 8.6* 8.5* 8.7* 9.0  CREATININE 0.77 0.93 0.81 0.90  GFRNONAA >60 >60 >60 >60    LIVER FUNCTION TESTS: Recent Labs    11/11/23 0510 11/12/23 0555 11/13/23 0157 11/17/23 0910  BILITOT 1.0 0.7 0.6 0.7  AST 85* 74* 68* 42*  ALT 132* 120* 113* 67*  ALKPHOS 49 46 49 49  PROT 5.9* 6.0* 6.2* 6.9  ALBUMIN 2.8* 2.8* 2.9* 3.2*    TUMOR MARKERS: No results for input(s): "AFPTM", "CEA", "CA199", "CHROMGRNA" in the last 8760 hours.  Assessment and Plan:  Repeat image guided supraclavicular lymph node biopsy d/t insufficient sample for testing with previous biopsy. Patient understands and agrees to procedure. Planned afternoon to allow longer time of being NPO and to hold heparin gtt. VSS stable, afebrile.  Risks and benefits of image guided supraclavicular lymph node biopsy was discussed with the patient and/or patient's family including, but not limited to bleeding, infection, damage to adjacent structures or low yield requiring additional tests.  All of the questions were answered and there is agreement to proceed.  Consent signed and in chart.   Thank you for allowing our service to participate in Debra [name] 's care.    Electronically Signed: Carlton Adam, NP   11/19/2023, 10:38 AM     I spent a total of 20 Minutes   in face to face  in clinical consultation, greater than 50% of which was counseling/coordinating care for image guided supraclavicular lymph node biopsy   (A copy of this note was sent to the referring provider and the time of visit.)

## 2023-11-19 NOTE — Progress Notes (Signed)
 Patient status unchanged, post procedure/recovery, report given to care nurse Adelina RN post procedure/252, questions answered. Family member at bedside with update given.

## 2023-11-19 NOTE — Consult Note (Signed)
 PHARMACY - ANTICOAGULATION CONSULT NOTE  Pharmacy Consult for Heparin Indication: chest pain/ACS and atrial fibrillation  Allergies  Allergen Reactions   Cephalexin Hives   Strawberry Extract Hives   Patient Measurements: Height: 5\' 4"  (162.6 cm) Weight: 81.6 kg (179 lb 14.3 oz) IBW/kg (Calculated) : 54.7 HEPARIN DW (KG): 72.3  Vital Signs: Temp: 98.3 F (36.8 C) (04/03 1100) Temp Source: Oral (04/03 1100) BP: 113/51 (04/03 1100) Pulse Rate: 73 (04/03 1100)  Labs: Recent Labs    11/17/23 0910 11/17/23 1200 11/17/23 1829 11/17/23 1829 11/18/23 0250 11/18/23 1149 11/18/23 2125 11/19/23 0413 11/19/23 1258  HGB 9.2*  --   --   --  8.1*  --   --  7.8*  --   HCT 27.7*  --   --   --  24.0*  --   --  24.0*  --   PLT 204  --   --   --  183  --   --  184  --   APTT  --   --  39*   < > 53* >200* 42* 65* 61*  LABPROT 14.7  --   --   --   --   --   --   --   --   INR 1.1  --   --   --   --   --   --   --   --   HEPARINUNFRC  --   --  0.70   < > 0.63  --   --  0.53 0.62  CREATININE 0.90  --   --   --   --   --   --   --   --   TROPONINIHS 481* 579* 791*  --   --  405*  --   --   --    < > = values in this interval not displayed.   Estimated Creatinine Clearance: 61 mL/min (by C-G formula based on SCr of 0.9 mg/dL).  Medical History: Past Medical History:  Diagnosis Date   Anxiety    Aortic atherosclerosis (HCC)    Arthritis    Atrial fibrillation (HCC) 08/01/2020   a.) CHA2DS2-VASc = 4 (age, sex, HTN, aortic plaque). b.) chronically anticoagulated using apixaban   Breast cancer, right breast (HCC) 01/08/2021   a.) Stage IB (cT2cN0cM0) invasive mammary carcinoma of the RIGHT breast; grade I, ER/PR (+) and HER2/neu (+). Tx with neoadjuvant TCHP chemotherapy.   Carotid bruit    L --nl doppler 5/09- and again 5/13 with 0-39% stenosis bilat   Constipation    COPD (chronic obstructive pulmonary disease) (HCC)    Diastolic dysfunction 08/02/2020   a.) TTE 08/02/2020: EF  60-65%; G1DD; triv MR/AR. b.) TTE 02/05/2021: ED 55-60%; G1DD; GLS -19.0%. c.) TTE 05/07/2021: TTE 55-60%; GLS -20.3%.   Family history of brain cancer    Family history of breast cancer    Family history of kidney cancer    Family history of lung cancer    Fatigue    Fracture of femoral neck, right (HCC) 2015   GERD (gastroesophageal reflux disease)    GI (gastrointestinal bleed)    [name]   Hepatitis    Hyperlipidemia    Hypertension    Left arm pain    Leg pain    Chronic pain R leg from injury   Long term current use of anticoagulant    a.) apixaban   OSA and COPD overlap syndrome (HCC)    a.) no nocurnal  PAP therapy; does utilize supplemental oxygen.   Osteopenia    Other organic sleep disorders    Supplemental oxygen dependent    Tobacco abuse    Medications:  Apixaban 5mg  BID Last dose: last reported 3/27 in the morning, however, patient cannot remember if she took a dose after being discharged yesterday -- Per patient, she can't remember if she took her apixaban yesterday evening with all of her other medications, and unable to obtain baseline levels prior to starting infusion  Assessment: 68 year old female with history of A-fib, COPD on 3 L home O2, breast cancer on chemotherapy, HTN presenting to the emergency department for evaluation of vomiting.  Patient reports that she developed recurrent nausea with 3 episodes of vomiting earlier today leading her to call EMS.   Patient was just discharged yesterday(3/31). At that time she presented with vomiting and diarrhea and was found to have a transaminitis with liver lesions for which she had a liver biopsy performed. During her admission she was noted to have right facial weakness for which MRI demonstrated multiple small acute infarcts possibly related to embolic event in the setting of poor absorption of her Eliquis secondary to nausea and vomiting. Pharmacy has been consulted for continuous heparin infusion monitoring and  dosing.  Baseline labs: Hgb 9.2 Plt 204  Goal of Therapy:  Heparin level 0.3-0.7 units/ml aPTT 66-102 seconds Monitor platelets by anticoagulation protocol: Yes  Heparin/aPTT Date/Time HL/aPTT Clinical Assessment 4/1@1829  0.70/39 Dosing based on aPTT; SUBtherapeutic  4/2 @0250  0.63/53 aPTT, SUBtherapeutic 4/2@1149  -- / >200 aPTT, SUPRAtherapeutic 4/2@2125  --/42   aPTT subtherapeutic 4/3  0413 0.53/65 aPTT slightly subtherapeutic 4/3  1258 0.62/61 aPTT subtherapeutic    Plan:  aPTT remains sub-therapeutic with unknown day of las Eliquis dose and not correlating with HL. Will give Heparin IV bolus of 1100 unit then increase heparin infusion from 1300 units/hour to 1450 units/hr. Will order aPTT 6 hrs after rate change Continue to monitor CBC with AM labs  Tandy Grawe Rodriguez-Guzman PharmD, BCPS 11/19/2023 2:07 PM

## 2023-11-19 NOTE — Procedures (Signed)
 Interventional Radiology Procedure:   Indications: Metastatic adenocarcinoma and needs more tissue for testing.   Procedure: US guided core biopsy of right supraclavicular lymph node  Findings: 5 core biopsies obtained from a right supraclavicular node, specimens placed in formalin.   Complications: None     EBL: Minimal  Plan: Return to inpatient room. Resume IV heparin.   Lynley Killilea R. Lowella Dandy, MD  Pager: [phone]

## 2023-11-19 NOTE — Evaluation (Signed)
 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 Patient noted to have been recently admitted March 25 to March 31 for issues including intractable nausea and vomiting, acute embolic CVA, transaminitis with new liver lesions.   Clinical Impression  Patient alert, agreeable to PT, denied pain. Pt reported her ambulation and her speech still feel different from baseline. Recently hospitalized, but prior to this the pt endorsed being independent. Supine to sit with supervision, MMT revealed RLE weakness and mild coordination deficits noted with ambulation as well. She was able to ambulate >266ft but reliant on RW. Attempted ambulation without, pt immediately reached for external support, minA for stabilization. PT/pt discussed follow up therapy options, pt remains motivated to return to PLOF and reported supported family. She would benefit from (>3hr ) therapy to maximize outcomes and return to PLOF.         If plan is discharge home, recommend the following: A little help with walking and/or transfers;A little help with bathing/dressing/bathroom;Assist for transportation;Help with stairs or ramp for entrance   Can travel by private vehicle        Equipment Recommendations Other (comment) (TBD)  Recommendations for Other Services       Functional Status Assessment Patient has had a recent decline in their functional status and demonstrates the ability to make significant improvements in function in a reasonable and  predictable amount of time.     Precautions / Restrictions Precautions Precautions: Fall Recall of Precautions/Restrictions: Intact Restrictions Weight Bearing Restrictions Per Provider Order: No      Mobility  Bed Mobility Overal bed mobility: Needs Assistance Bed Mobility: Supine to Sit     Supine to sit: Supervision, Used rails, HOB elevated          Transfers Overall transfer level: Needs assistance Equipment used: Rolling walker (2 wheels) Transfers: Sit to/from Stand Sit to Stand: Contact guard assist                Ambulation/Gait Ambulation/Gait assistance: Contact guard assist, Min assist Gait Distance (Feet): 220 Feet Assistive device: Rolling walker (2 wheels), None   Gait velocity: decr     General Gait Details: pt immediately reaches for external support without RW,  minA for steadying, improved confidence with RW  Stairs            Wheelchair Mobility     Tilt Bed    Modified Rankin (Stroke Patients Only)       Balance Overall balance assessment: Needs assistance Sitting-balance support: Feet supported Sitting balance-Leahy Scale: Good     Standing balance support: Bilateral upper extremity supported, Reliant on assistive device for balance, Single extremity supported Standing balance-Leahy Scale: Poor               High level balance activites: Direction changes, Turns, Head turns High Level Balance Comments: CGA with RW, but very decreased, difficulty with turns (RW turns as well) minimal change in speed noted, difficulty with direction changes             Pertinent Vitals/Pain Pain Assessment Pain  Assessment: No/denies pain    Home Living Family/patient expects to be discharged to:: Private residence Living Arrangements: Spouse/significant other Available Help at Discharge: Family Type of Home: Mobile home Home Access: Stairs to enter Entrance Stairs-Rails: Right;Left;Can reach both Entrance  Stairs-Number of Steps: 4   Home Layout: One level Home Equipment: Rollator (4 wheels)      Prior Function Prior Level of Function : Independent/Modified Independent             Mobility Comments: no AD in the home, rollator community distances ADLs Comments: MOD I-I in ADL/IADL, recent increased assist following previous acute stay discharging 3/31 but was not home for long     Extremity/Trunk Assessment        Lower Extremity Assessment Lower Extremity Assessment: RLE deficits/detail;LLE deficits/detail RLE Deficits / Details: hip flexion, knee extension 4-/5, chronic ankle fusion/break reported by pt, unable to DF/PF. can wiggle toes RLE Sensation: WNL RLE Coordination: decreased gross motor LLE Deficits / Details: strength 5/5 LLE Sensation: WNL LLE Coordination: WNL    Cervical / Trunk Assessment Cervical / Trunk Assessment: Normal  Communication   Communication Communication: Impaired Factors Affecting Communication: Difficulty expressing self (dysarthritic)    Cognition Arousal: Alert Behavior During Therapy: WFL for tasks assessed/performed   PT - Cognitive impairments: No apparent impairments                                 Cueing Cueing Techniques: Verbal cues     General Comments      Exercises     Assessment/Plan    PT Assessment Patient needs continued PT services  PT Problem List Decreased strength;Decreased activity tolerance;Decreased balance;Decreased mobility       PT Treatment Interventions DME instruction;Gait training;Stair training;Functional mobility training;Therapeutic activities;Therapeutic exercise;Balance training;Neuromuscular re-education;Patient/family education    PT Goals (Current goals can be found in the Care Plan section)  Acute Rehab PT Goals Patient Stated Goal: to go home PT Goal Formulation: With patient/family Time For Goal Achievement: 12/03/23 Potential to Achieve Goals: Good    Frequency  Min 3X/week     Co-evaluation               AM-PAC PT "6 Clicks" Mobility  Outcome Measure Help needed turning from your back to your side while in a flat bed without using bedrails?: None Help needed moving from lying on your back to sitting on the side of a flat bed without using bedrails?: None Help needed moving to and from a bed to a chair (including a wheelchair)?: None Help needed standing up from a chair using your arms (e.g., wheelchair or bedside chair)?: A Little Help needed to walk in hospital room?: A Little Help needed climbing 3-5 steps with a railing? : A Little 6 Click Score: 21    End of Session Equipment Utilized During Treatment: Gait belt Activity Tolerance: Patient tolerated treatment well Patient left: in chair;with call bell/phone within reach;with chair alarm set Nurse Communication: Mobility status PT Visit Diagnosis: Other abnormalities of gait and mobility (R26.89);Muscle weakness (generalized) (M62.81);Difficulty in walking, not elsewhere classified (R26.2);Unsteadiness on feet (R26.81)    Time: 1610-9604 PT Time Calculation (min) (ACUTE ONLY): 26 min   Charges:   PT Evaluation $PT Eval Low Complexity: 1 Low PT Treatments $Therapeutic Activity: 8-22 mins PT General Charges $$ ACUTE PT VISIT: 1 Visit         Olga Coaster PT, DPT 10:37 AM,11/19/23

## 2023-11-19 NOTE — Progress Notes (Signed)
  I have reviewed and concur with this student's documentation.   Allyn Kenner , BSN, RN

## 2023-11-19 NOTE — Consult Note (Signed)
 PHARMACY - ANTICOAGULATION CONSULT NOTE  Pharmacy Consult for Heparin Indication: chest pain/ACS and atrial fibrillation  Allergies  Allergen Reactions   Cephalexin Hives   Strawberry Extract Hives   Patient Measurements: Height: 5\' 4"  (162.6 cm) Weight: 81.6 kg (179 lb 14.3 oz) IBW/kg (Calculated) : 54.7 HEPARIN DW (KG): 72.3  Vital Signs: Temp: 98.8 F (37.1 C) (04/03 2029) Temp Source: Oral (04/03 1523) BP: 103/47 (04/03 2029) Pulse Rate: 77 (04/03 2029)  Labs: Recent Labs    11/17/23 0910 11/17/23 1200 11/17/23 1829 11/17/23 1829 11/18/23 0250 11/18/23 1149 11/18/23 2125 11/19/23 0413 11/19/23 1258 11/19/23 2157  HGB 9.2*  --   --   --  8.1*  --   --  7.8*  --   --   HCT 27.7*  --   --   --  24.0*  --   --  24.0*  --   --   PLT 204  --   --   --  183  --   --  184  --   --   APTT  --   --  39*   < > 53* >200*   < > 65* 61* 57*  LABPROT 14.7  --   --   --   --   --   --   --   --   --   INR 1.1  --   --   --   --   --   --   --   --   --   HEPARINUNFRC  --   --  0.70   < > 0.63  --   --  0.53 0.62  --   CREATININE 0.90  --   --   --   --   --   --   --   --   --   TROPONINIHS 481* 579* 791*  --   --  405*  --   --   --   --    < > = values in this interval not displayed.   Estimated Creatinine Clearance: 61 mL/min (by C-G formula based on SCr of 0.9 mg/dL).  Medical History: Past Medical History:  Diagnosis Date   Anxiety    Aortic atherosclerosis (HCC)    Arthritis    Atrial fibrillation (HCC) 08/01/2020   a.) CHA2DS2-VASc = 4 (age, sex, HTN, aortic plaque). b.) chronically anticoagulated using apixaban   Breast cancer, right breast (HCC) 01/08/2021   a.) Stage IB (cT2cN0cM0) invasive mammary carcinoma of the RIGHT breast; grade I, ER/PR (+) and HER2/neu (+). Tx with neoadjuvant TCHP chemotherapy.   Carotid bruit    L --nl doppler 5/09- and again 5/13 with 0-39% stenosis bilat   Constipation    COPD (chronic obstructive pulmonary disease) (HCC)     Diastolic dysfunction 08/02/2020   a.) TTE 08/02/2020: EF 60-65%; G1DD; triv MR/AR. b.) TTE 02/05/2021: ED 55-60%; G1DD; GLS -19.0%. c.) TTE 05/07/2021: TTE 55-60%; GLS -20.3%.   Family history of brain cancer    Family history of breast cancer    Family history of kidney cancer    Family history of lung cancer    Fatigue    Fracture of femoral neck, right (HCC) 2015   GERD (gastroesophageal reflux disease)    GI (gastrointestinal bleed)    [name]   Hepatitis    Hyperlipidemia    Hypertension    Left arm pain    Leg pain    Chronic pain R  leg from injury   Long term current use of anticoagulant    a.) apixaban   OSA and COPD overlap syndrome (HCC)    a.) no nocurnal PAP therapy; does utilize supplemental oxygen.   Osteopenia    Other organic sleep disorders    Supplemental oxygen dependent    Tobacco abuse    Medications:  Apixaban 5mg  BID Last dose: last reported 3/27 in the morning, however, patient cannot remember if she took a dose after being discharged yesterday -- Per patient, she can't remember if she took her apixaban yesterday evening with all of her other medications, and unable to obtain baseline levels prior to starting infusion  Assessment: 68 year old female with history of A-fib, COPD on 3 L home O2, breast cancer on chemotherapy, HTN presenting to the emergency department for evaluation of vomiting.  Patient reports that she developed recurrent nausea with 3 episodes of vomiting earlier today leading her to call EMS.   Patient was just discharged yesterday(3/31). At that time she presented with vomiting and diarrhea and was found to have a transaminitis with liver lesions for which she had a liver biopsy performed. During her admission she was noted to have right facial weakness for which MRI demonstrated multiple small acute infarcts possibly related to embolic event in the setting of poor absorption of her Eliquis secondary to nausea and vomiting. Pharmacy has  been consulted for continuous heparin infusion monitoring and dosing.  Baseline labs: Hgb 9.2 Plt 204  Goal of Therapy:  Heparin level 0.3-0.7 units/ml aPTT 66-102 seconds Monitor platelets by anticoagulation protocol: Yes  Heparin/aPTT Date/Time HL/aPTT Clinical Assessment 4/1@1829  0.70/39 Dosing based on aPTT; SUBtherapeutic  4/2 @0250  0.63/53 aPTT, SUBtherapeutic 4/2@1149  -- / >200 aPTT, SUPRAtherapeutic 4/2@2125  --/42   aPTT subtherapeutic 4/3  0413 0.53/65 aPTT slightly subtherapeutic 4/3  1258 0.62/61 aPTT subtherapeutic 4/3  2157 -- / 57  aPTT subtherapeutic   Plan:  Bolus 1100 units x 1 Increase heparin infusion to  1600 units/hour Recheck aPTT w/ AM labs after rate change Continue to monitor CBC with AM labs  Otelia Sergeant, PharmD, [facility] 11/19/2023 10:52 PM

## 2023-11-19 NOTE — Consult Note (Signed)
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 Solid core biopsy samples were obtained. IMPRESSION: Ultrasound-guided core biopsy performed of an enlarged right supraclavicular lymph node measuring 2.2 cm in greatest diameter by ultrasound. Electronically Signed   By: Irish Lack M.D.   On: 11/16/2023 16:02   ECHOCARDIOGRAM COMPLETE Result Date: 11/12/2023    ECHOCARDIOGRAM REPORT   Patient Name:   Debra [name] Date of Exam: 11/11/2023 Medical Rec #:  [id number]    Height:       64.0 in Accession #:    [id number]   Weight:       180.0 lb Date of Birth:  March 18, 1956     BSA:          1.871 m Patient Age:    51 years     BP:           110/92 mmHg Patient Gender: F            HR:           80 bpm. Exam Location:  [facility] Procedure: 2D Echo, Cardiac Doppler and Color Doppler (Both Spectral and Color            Flow Doppler were utilized during procedure). Indications:     Elevated Troponin  History:         Patient has prior history of Echocardiogram examinations, most                  recent 04/25/2022. COPD,  Arrythmias:Atrial Fibrillation; Risk                  Factors:Hypertension and Dyslipidemia. Obstructive sleep apnea.  Sonographer:     Daphine Deutscher RDCS Referring Phys:  [id number] Wilfred Curtis QMVH Diagnosing Phys: Julien Nordmann MD IMPRESSIONS  1. Left ventricular ejection fraction, by estimation, is 60 to 65%. The left ventricle has normal function. The left ventricle has no regional wall motion abnormalities. There is mild left ventricular hypertrophy. Left ventricular diastolic parameters are consistent with Grade I diastolic dysfunction (impaired relaxation).  2. Right ventricular systolic function is normal. The right ventricular size is normal. There is normal pulmonary artery systolic pressure. The estimated right ventricular systolic pressure is 21.3  mmHg.  3. The mitral valve is normal in structure. Mild to moderate mitral valve regurgitation. No evidence of mitral stenosis.  4. The aortic valve is normal in structure. Aortic valve regurgitation is not visualized. No aortic stenosis is present.  5. The inferior vena cava is normal in size with greater than 50% respiratory variability, suggesting right atrial pressure of 3 mmHg. FINDINGS  Left Ventricle: Left ventricular ejection fraction, by estimation, is 60 to 65%. The left ventricle has normal function. The left ventricle has no regional wall motion abnormalities. Strain was performed and the global longitudinal strain is indeterminate. The left ventricular internal cavity size was normal in size. There is mild left ventricular hypertrophy. Left ventricular diastolic parameters are consistent with Grade I diastolic dysfunction (impaired relaxation). Right Ventricle: The right ventricular size is normal. No increase in right ventricular wall thickness. Right ventricular systolic function is normal. There is normal pulmonary artery systolic pressure. The tricuspid regurgitant velocity is 2.02 m/s, and  with an assumed right atrial pressure of 5  mmHg, the estimated right ventricular systolic pressure is 21.3 mmHg. Left Atrium: Left atrial size was normal in size. Right Atrium: Right atrial size was normal in size. Pericardium: There is no evidence of pericardial effusion. Mitral Valve: The mitral valve is normal in structure. Mild to moderate mitral valve regurgitation. No evidence of mitral valve stenosis. Tricuspid Valve: The tricuspid valve is normal in structure. Tricuspid valve regurgitation is not demonstrated. No evidence of tricuspid stenosis. Aortic Valve: The aortic valve is normal in structure. Aortic valve regurgitation is not visualized. No aortic stenosis is present. Pulmonic Valve: The pulmonic valve was normal in structure. Pulmonic valve regurgitation is not visualized. No evidence of pulmonic stenosis. Aorta: The aortic root is normal in size and structure. Venous: The inferior vena cava is normal in size with greater than 50% respiratory variability, suggesting right atrial pressure of 3 mmHg. IAS/Shunts: No atrial level shunt detected by color flow Doppler. Additional Comments: 3D was performed not requiring image post processing on an independent workstation and was indeterminate.  LEFT VENTRICLE PLAX 2D LVIDd:         4.90 cm   Diastology LVIDs:         3.10 cm   LV e' medial:    9.36 cm/s LV PW:         1.00 cm   LV E/e' medial:  9.1 LV IVS:        1.00 cm   LV e' lateral:   9.32 cm/s LVOT diam:     2.00 cm   LV E/e' lateral: 9.2 LV SV:         56 LV SV Index:   30 LVOT Area:     3.14 cm  RIGHT VENTRICLE RV Basal diam:  3.30 cm RV S prime:     18.33 cm/s TAPSE (M-mode): 3.2 cm LEFT ATRIUM             Index        RIGHT ATRIUM           Index LA diam:        3.90 cm 2.08 cm/m   RA Area:     11.60 cm LA Vol (A2C):   50.9 ml 27.21 ml/m  RA Volume:   27.60 ml  14.75 ml/m LA Vol (A4C):   38.2 ml 20.42 ml/m LA Biplane Vol: 44.4 ml 23.73 ml/m  AORTIC VALVE LVOT Vmax:   98.00 cm/s LVOT Vmean:  64.000  cm/s LVOT VTI:    0.179 m  AORTA Ao  Root diam: 3.30 cm MITRAL VALVE                TRICUSPID VALVE MV Area (PHT): 3.65 cm     TR Peak grad:   16.3 mmHg MV Decel Time: 208 msec     TR Vmax:        202.00 cm/s MR Peak grad: 82.4 mmHg MR Vmax:      454.00 cm/s   SHUNTS MV E velocity: 85.55 cm/s   Systemic VTI:  0.18 m MV A velocity: 123.50 cm/s  Systemic Diam: 2.00 cm MV E/A ratio:  0.69 Julien Nordmann MD Electronically signed by Julien Nordmann MD Signature Date/Time: 11/12/2023/1:59:01 PM    Final    MR ABDOMEN MRCP W WO CONTAST Result Date: 11/12/2023 CLINICAL DATA:  Evaluate liver lesions.  Chest mass. EXAM: MRI ABDOMEN WITHOUT AND WITH CONTRAST (INCLUDING MRCP) TECHNIQUE: Multiplanar multisequence MR imaging of the abdomen was performed both before and after the administration of intravenous contrast. Heavily T2-weighted images of the biliary and pancreatic ducts were obtained, and three-dimensional MRCP images were rendered by post processing. CONTRAST:  8mL GADAVIST GADOBUTROL 1 MMOL/ML IV SOLN COMPARISON:  CT AP 11/10/2023 FINDINGS: Lower chest: Small right pleural effusion. Partially visualized mass within the superior segment of right lower lobe noted. Hepatobiliary: There are multifocal bilobar, ill-defined areas of mild increased T2 signal noted within both lobes of liver which show hypoenhancement on the postcontrast images and restricted diffusion. At least 5 lesions are noted involving both lobes. The largest lesion is in segment 5 measuring 1.1 cm, image 17/8. Lesion in segment 2/3 measures 6 mm, image 10/8. Status post cholecystectomy. Mild intrahepatic bile duct dilatation. Fusiform dilatation of the common bile duct which measures 1.3 cm proximally. No signs of choledocholithiasis or mass. Pancreas:  No main duct dilatation, inflammation or mass identified. Spleen: Within the proximal portion of the spleen there are 2 wedge-shaped areas of peripheral hypoenhancement, image 25/31. Adrenals/Urinary Tract: There is a 1.3 cm nodule in  the left adrenal gland which is indeterminate with only mild loss of signal on the out of phase sequences, image 29/9. This is a new finding when compared with the remote CT of the abdomen pelvis from 09/17/2020. Normal right adrenal gland. No kidney mass or signs of obstructive uropathy. Stomach/Bowel: Visualized portions within the abdomen are unremarkable. Vascular/Lymphatic: No aneurysm. Aortic atherosclerosis. No adenopathy. Other:  No ascites. Musculoskeletal: Multifocal abnormal areas of enhancement are identified throughout the visualized portions of the thoracic and lumbar spine which are concerning for osseous metastasis. IMPRESSION: 1. There are multifocal bilobar, ill-defined areas of mild increased T2 signal noted within both lobes of liver which show hypoenhancement on the postcontrast images and restricted diffusion. At least 5 lesions are noted involving both lobes. 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 2. Multifocal abnormal areas of enhancement are identified throughout the visualized portions of the thoracic and lumbar spine which are concerning for osseous metastasis. 3. There are 2 wedge-shaped areas of peripheral hypoenhancement within the proximal portion of the spleen which are concerning for splenic infarcts. 4. There is a 1.3 cm nodule in the left adrenal gland which is indeterminate with only mild loss of signal on the out of phase sequences. This is a new finding when compared with the remote CT of the abdomen pelvis from 09/17/2020. Differential considerations include lipid poor adenoma versus a adrenal metastasis. 5. Small right pleural effusion. 6. Partially  visualized mass within the superior segment of right lower lobe. 7. Status post cholecystectomy. Mild intrahepatic bile duct dilatation. Fusiform dilatation of the common bile duct which measures 1.3 cm proximally. No signs of choledocholithiasis or mass. Electronically Signed   By: Signa Kell M.D.   On: 11/12/2023  05:07   MR 3D Recon At Scanner Result Date: 11/12/2023 CLINICAL DATA:  Evaluate liver lesions.  Chest mass. EXAM: MRI ABDOMEN WITHOUT AND WITH CONTRAST (INCLUDING MRCP) TECHNIQUE: Multiplanar multisequence MR imaging of the abdomen was performed both before and after the administration of intravenous contrast. Heavily T2-weighted images of the biliary and pancreatic ducts were obtained, and three-dimensional MRCP images were rendered by post processing. CONTRAST:  8mL GADAVIST GADOBUTROL 1 MMOL/ML IV SOLN COMPARISON:  CT AP 11/10/2023 FINDINGS: Lower chest: Small right pleural effusion. Partially visualized mass within the superior segment of right lower lobe noted. Hepatobiliary: There are multifocal bilobar, ill-defined areas of mild increased T2 signal noted within both lobes of liver which show hypoenhancement on the postcontrast images and restricted diffusion. At least 5 lesions are noted involving both lobes. The largest lesion is in segment 5 measuring 1.1 cm, image 17/8. Lesion in segment 2/3 measures 6 mm, image 10/8. Status post cholecystectomy. Mild intrahepatic bile duct dilatation. Fusiform dilatation of the common bile duct which measures 1.3 cm proximally. No signs of choledocholithiasis or mass. Pancreas:  No main duct dilatation, inflammation or mass identified. Spleen: Within the proximal portion of the spleen there are 2 wedge-shaped areas of peripheral hypoenhancement, image 25/31. Adrenals/Urinary Tract: There is a 1.3 cm nodule in the left adrenal gland which is indeterminate with only mild loss of signal on the out of phase sequences, image 29/9. This is a new finding when compared with the remote CT of the abdomen pelvis from 09/17/2020. Normal right adrenal gland. No kidney mass or signs of obstructive uropathy. Stomach/Bowel: Visualized portions within the abdomen are unremarkable. Vascular/Lymphatic: No aneurysm. Aortic atherosclerosis. No adenopathy. Other:  No ascites.  Musculoskeletal: Multifocal abnormal areas of enhancement are identified throughout the visualized portions of the thoracic and lumbar spine which are concerning for osseous metastasis. IMPRESSION: 1. There are multifocal bilobar, ill-defined areas of mild increased T2 signal noted within both lobes of liver which show hypoenhancement on the postcontrast images and restricted diffusion. At least 5 lesions are noted involving both lobes. 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 2. Multifocal abnormal areas of enhancement are identified throughout the visualized portions of the thoracic and lumbar spine which are concerning for osseous metastasis. 3. There are 2 wedge-shaped areas of peripheral hypoenhancement within the proximal portion of the spleen which are concerning for splenic infarcts. 4. There is a 1.3 cm nodule in the left adrenal gland which is indeterminate with only mild loss of signal on the out of phase sequences. This is a new finding when compared with the remote CT of the abdomen pelvis from 09/17/2020. Differential considerations include lipid poor adenoma versus a adrenal metastasis. 5. Small right pleural effusion. 6. Partially visualized mass within the superior segment of right lower lobe. 7. Status post cholecystectomy. Mild intrahepatic bile duct dilatation. Fusiform dilatation of the common bile duct which measures 1.3 cm proximally. No signs of choledocholithiasis or mass. Electronically Signed   By: Signa Kell M.D.   On: 11/12/2023 05:07   CT ANGIO HEAD NECK W WO CM Addendum Date: 11/11/2023 ADDENDUM REPORT: 11/11/2023 23:37 ADDENDUM: Findings discussed with Mariah Milling, NP via telephone at 10:33 p.m. Electronically Signed  By: Feliberto Harts M.D.   On: 11/11/2023 23:37   Result Date: 11/11/2023 CLINICAL DATA:  Stroke/TIA, determine embolic source EXAM: CT ANGIOGRAPHY HEAD AND NECK WITH AND WITHOUT CONTRAST TECHNIQUE: Multidetector CT imaging of the head and neck was  performed using the standard protocol during bolus administration of intravenous contrast. Multiplanar CT image reconstructions and MIPs were obtained to evaluate the vascular anatomy. Carotid stenosis measurements (when applicable) are obtained utilizing NASCET criteria, using the distal internal carotid diameter as the denominator. RADIATION DOSE REDUCTION: This exam was performed according to the departmental dose-optimization program which includes automated exposure control, adjustment of the mA and/or kV according to patient size and/or use of iterative reconstruction technique. CONTRAST:  75mL OMNIPAQUE IOHEXOL 350 MG/ML SOLN COMPARISON:  Same day MRI head. FINDINGS: CTA NECK FINDINGS Aortic arch: Aortic atherosclerosis. 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 Great vessel origins are patent. Right carotid system: Atherosclerosis at the carotid bifurcation without greater than 50% stenosis. Left carotid system: Atherosclerosis at the carotid bifurcation without greater than 50% stenosis. Vertebral arteries: Codominant. No evidence of dissection, stenosis (50% or greater), or occlusion. Skeleton: No evidence of acute abnormality on limited assessment. Multilevel degenerative change. Other neck: Approximately 2.3 cm left thyroid nodule. Upper chest: Please see same day CT chest for intrathoracic findings. Emphysema. Review of the MIP images confirms the above findings CTA HEAD FINDINGS Anterior circulation: Bilateral intracranial ICAs are patent. Approximately 2 mm medially directed outpouching arising from the right cavernous ICA, which could represent an infundibulum or aneurysm. Bilateral M1 MCAs are patent. 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 Bilateral ACAs are patent without proximal hemodynamically significant stenosis. Posterior circulation: Bilateral intradural vertebral arteries, basilar artery and bilateral posterior cerebral arteries  are patent without proximal hemodynamically significant stenosis. Venous sinuses: As permitted by contrast timing, patent. Review of the MIP images confirms the above findings The ordering provider has been paged at the time of dictation for call of report. IMPRESSION: 1. 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 2. Approximately 2 mm medially directed outpouching arising from the right cavernous ICA, which could represent an infundibulum or aneurysm. 3. Please see same day CT chest for intrathoracic findings. 4. Approximately 2.3 cm left thyroid nodule. Recommend thyroid US (ref: J Am Coll Radiol. 2015 Feb;12(2): 143-50). 5. Aortic Atherosclerosis (ICD10-I70.0) and Emphysema (ICD10-J43.9). Electronically Signed: By: Feliberto Harts M.D. On: 11/11/2023 23:14   MR BRAIN W WO CONTRAST Result Date: 11/11/2023 CLINICAL DATA:  right facial weakness, malignancy also on the ddx EXAM: MRI HEAD WITHOUT AND WITH CONTRAST TECHNIQUE: Multiplanar, multiecho pulse sequences of the brain and surrounding structures were obtained without and with intravenous contrast. CONTRAST:  8mL GADAVIST GADOBUTROL 1 MMOL/ML IV SOLN COMPARISON:  CT head November 11, 2023. 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 Mild edema without mass effect. No midline shift. Probable additional punctate acute infarct in the inferior left cerebellum. No evidence of acute hemorrhage, mass lesion, or hydrocephalus. Vascular: Major arterial flow voids are maintained at the skull base. Skull and upper cervical spine: Normal marrow signal. Sinuses/Orbits: Clear sinuses.  No acute orbital findings. IMPRESSION: 1. 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 Probable additional punctate acute infarct in the inferior left cerebellum. These results will be called to the ordering clinician or  representative by the Radiologist Assistant, and  communication documented in the PACS or [employer]. Electronically Signed   By: Feliberto Harts M.D.   On: 11/11/2023 20:27   CT CHEST W CONTRAST Result Date: 11/11/2023 CLINICAL DATA:  Liver lesions with history of breast cancer. EXAM: CT CHEST WITH CONTRAST TECHNIQUE: Multidetector CT imaging of the chest was performed during intravenous contrast administration. RADIATION DOSE REDUCTION: This exam was performed according to the departmental dose-optimization program which includes automated exposure control, adjustment of the mA and/or kV according to patient size and/or use of iterative reconstruction technique. CONTRAST:  75mL OMNIPAQUE IOHEXOL 300 MG/ML  SOLN COMPARISON:  CT abdomen and pelvis 11/10/2023. FINDINGS: Cardiovascular: Heart is borderline enlarged/mildly enlarged. Aorta is normal in size. There is no pericardial effusion. There are atherosclerotic calcifications of the aorta. Mediastinum/Nodes: There is a heterogeneous left thyroid nodule attaining cystic and solid components measuring 2.6 x 1.5 cm. There are multiple enlarged right paratracheal lymph nodes measuring up to 2.0 x 2.9 cm. Enlarged subcarinal lymph node measures 1.2 cm short axis. There is a prominent right lower esophageal lymph node measuring 8 mm short axis. There are multiple enlarged right hilar lymph nodes measuring up to 1.4 x 1.8 cm. Visualized esophagus is within normal limits. Lungs/Pleura: Mild emphysematous changes are present. There is scarring in both lung apices. Multifocal airspace disease is seen throughout the right lower lobe, predominantly posteriorly and peripherally. Some areas are slightly nodular for example superior segment measuring 3.2 by 2.0 cm. A small air-fluid level seen in this region. No other air-fluid levels are seen. Additional nodular density seen in the right lower lobe image 5/92 measuring 9 mm. Smaller nodules are seen scattered  throughout the right lower lobe measuring 5 mm or less. There is a trace right pleural effusion. There is a right middle lobe nodule measuring 5 mm image 5/122. There are prominent intrapulmonary lymph nodes along the right major fissure. There are 2 mm nodular densities in the left upper lobe. Left lung is otherwise clear. Upper Abdomen: Subcentimeter hypodensities in the liver and left adrenal mass are unchanged. Please see CT abdomen and pelvis performed same day for further description. Musculoskeletal: There is anterior intramuscular chest wall edema on the right. Focal density is seen in the medial right breast extending to the skin surface measuring 1.2 x 2.3 cm image 3/81. There some associated diffuse right breast skin thickening. No acute fracture. There is a single 5 mm sclerotic density in the T3 vertebral body. IMPRESSION: 1. Multifocal airspace disease throughout the right lower lobe, predominantly posteriorly and peripherally. Single area is slightly nodular in the superior segment containing an air-fluid level worrisome for necrotic pneumonia or necrotic nodule. Findings may be infectious/inflammatory, but neoplasm not excluded. 2. Trace right pleural effusion. 3. Mediastinal and right hilar lymphadenopathy. 4. Additional right lower lobe and right middle lobe pulmonary nodules measuring 5 mm. 5. Left thyroid nodule measuring 2.6 cm. Recommend further evaluation with ultrasound. 6. Focal density in the medial right breast extending to the skin surface measuring 1.2 x 2.3 cm. Correlate with physical exam. 7. Single 5 mm sclerotic density in the T3 vertebral body. Aortic Atherosclerosis (ICD10-I70.0) and Emphysema (ICD10-J43.9). Electronically Signed   By: Darliss Cheney M.D.   On: 11/11/2023 17:53   CT HEAD WO CONTRAST ( ) Result Date: 11/11/2023 CLINICAL DATA:  Right facial weakness. EXAM: CT HEAD WITHOUT CONTRAST TECHNIQUE: Contiguous axial images were obtained from the base of the skull through  the vertex without intravenous contrast. RADIATION DOSE REDUCTION: This exam was  performed according to the departmental dose-optimization program which includes automated exposure control, adjustment of the mA and/or kV according to patient size and/or use of iterative reconstruction technique. COMPARISON:  Head CT 07/21/2022 FINDINGS: Brain: There are new small hypodensities in the left frontoparietal white matter at the level of the posterior centrum semiovale (series 3, image 20), and there is a new subcentimeter focus of cortical hypodensity at the level of the left parietal operculum (series 6, image 46), suspicious for recent infarcts. No intracranial hemorrhage, mass, midline shift, or extra-axial fluid collection is identified. Cerebral volume is normal. The ventricles are normal in size. Vascular: No hyperdense vessel. Skull: No acute fracture or suspicious lesion. Sinuses/Orbits: Visualized paranasal sinuses and mastoid air cells are clear. Bilateral cataract extraction. Other: None. 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 A brain MRI is in progress and will provide further evaluation. Electronically Signed   By: Sebastian Ache M.D.   On: 11/11/2023 15:13   DG Chest 2 View Result Date: 11/10/2023 CLINICAL DATA:  dyspnea EXAM: CHEST - 2 VIEW COMPARISON:  Chest x-ray 07/21/2022, chest x-ray 02/15/2021 FINDINGS: The heart and mediastinal contours are unchanged. Prominent hilar vasculature. No focal consolidation. No pulmonary edema. No pleural effusion. No pneumothorax. No acute osseous abnormality. IMPRESSION: Prominent hilar vasculature suggestive of pulmonary venous congestion. Underlying lymph nodes not excluded. Electronically Signed   By: Tish Frederickson M.D.   On: 11/10/2023 23:50   CT ABDOMEN PELVIS W CONTRAST Result Date: 11/10/2023 CLINICAL DATA:  Acute nonlocalized abdominal pain. Vomiting and diarrhea. EXAM: CT ABDOMEN AND PELVIS WITH  CONTRAST TECHNIQUE: Multidetector CT imaging of the abdomen and pelvis was performed using the standard protocol following bolus administration of intravenous contrast. RADIATION DOSE REDUCTION: This exam was performed according to the departmental dose-optimization program which includes automated exposure control, adjustment of the mA and/or kV according to patient size and/or use of iterative reconstruction technique. CONTRAST:  OMNIPAQUE IOHEXOL 300 MG/ML  SOLN COMPARISON:  None Available. FINDINGS: Lower chest: Focal consolidation suggested in the right lung base with small right pleural effusion. This may represent pneumonia or less likely aspiration. Emphysematous changes in the lungs. Hepatobiliary: Multiple scattered low-attenuation lesions in the liver, most are subcentimeter in size. Largest is in the medial segment left lobe measuring 1.3 cm in diameter. Enhancement pattern is indeterminate. Consider follow-up MRI for further characterization. The gallbladder is not visualized, likely surgically absent. Mild intrahepatic bile duct dilatation is most likely due to postoperative change. No common duct stones are identified. Pancreas: Unremarkable. No pancreatic ductal dilatation or surrounding inflammatory changes. Spleen: Segmental low-attenuation in the upper spleen likely represents a splenic infarct. Spleen size is normal. Adrenals/Urinary Tract: 1.3 cm diameter left adrenal gland nodule with density measurement of 69 Hounsfield units. Renal nephrograms are symmetrical. No solid mass identified. No hydronephrosis or hydroureter. Bladder is normal. Stomach/Bowel: Stomach is within normal limits. Appendix appears normal. No evidence of bowel wall thickening, distention, or inflammatory changes. Vascular/Lymphatic: Aortic atherosclerosis. No enlarged abdominal or pelvic lymph nodes. Reproductive: Uterus and bilateral adnexa are unremarkable. Other: No abdominal wall hernia or abnormality. No  abdominopelvic ascites. Musculoskeletal: Postoperative fixation of the right hip. No acute bony abnormalities. IMPRESSION: 1. Nonspecific low-attenuation lesions in the liver, largest measuring 1.3 cm diameter. Enhancement pattern is indeterminate. Consider follow-up MRI for further characterization. 2. Mild intrahepatic bile duct dilatation is likely postoperative in the setting of cholecystectomy. 3. Left adrenal mass measuring 1.3 cm, probable benign adenoma. Recommend follow-up  adrenal washout CT in 1 year. If stable for = 1 year, no further follow-up imaging. JACR 2017 Aug; 14(8):1038-44, JCAT 2016 Mar-Apr; 40(2):194-200, Urol J 2006 Spring; 3(2):71-4. 4. Aortic atherosclerosis. 5. Focal area of consolidation suggested in the right lung base with small right pleural effusion, likely pneumonia. Electronically Signed   By: Burman Nieves M.D.   On: 11/10/2023 23:30     Assessment and plan-   # Metastatic carcinoma -likely recurrent breast cancer, CA 27-29, CA 15.3 or elevated. Other differential includes primary lung cancer vs other tissue origin metastasis,  Right supraclavicular lymphadenopathy biopsy showed metastatic adenocarcinoma with extensive necrosis.   Tissue origin is not able to be determined due to limited tissue. I discussed with IR for feasibility of repeat biopsy with minimal interruption of heparin. Patient underwent repeat ultrasound-guided biopsy today with weight heparin for 1 hour.  # Recurrent embolic CVA Likely due to hypercoagulable state, as well as decreased Eliquis absorption/nonadherence. Recommend to resume heparin after biopsy At discharge,I recommend to switch to Lovenox subcutaneous 1 mg/kg twice daily.  Patient agrees.  # Normocytic anemia, normal B12, iron panel. Check protein electrophoresis.  ? Dilution vs possible marrow involvement of metastatic carcinoma. Transfuse PRBC if Hb<=7  # elevated Troponin Favored to reflect supply/demand mismatch in the  setting of recurrent CVA. No EKG changes or chest pain to suggest true NSTEMI.   Thank you for allowing me to participate in the care of this patient.   Rickard Patience, MD, PhD Hematology Oncology 11/19/2023

## 2023-11-19 NOTE — Progress Notes (Signed)
 Patient clinically stable post [place] LN biopsy, tolerated well. Vitals stable pre and post procedure. Denies complaints post procedure. Received Versed 1 mg along with Fentanyl 50 mcg IV for procedure.
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
## 2023-11-19 NOTE — Progress Notes (Signed)
 IP rehab admissions - I called patient room and spoke with daughter.  Dtr said to call step-father.  I placed call and left message with patient's husband, but no return phone call yet today.  I will have my partner follow up tomorrow.  [phone]

## 2023-11-19 NOTE — Progress Notes (Signed)
   Patient Name: Debra [name] Date of Encounter: 11/19/2023 [facility] Cardiologist: Debbe Odea, MD   Interval Summary  .    Patient is in NSR. She denies chest pain or SOB. Note some right sided weakness. HS troponin trending down.  Vital Signs .    Vitals:   11/18/23 2000 11/18/23 2315 11/19/23 0400 11/19/23 0740  BP: (!) 119/54 (!) 115/51 (!) 113/59 (!) 107/50  Pulse:    68  Resp:  18 20   Temp: 98.2 F (36.8 C) 98.3 F (36.8 C) 97.9 F (36.6 C) 97.9 F (36.6 C)  TempSrc: Oral Oral Oral Oral  SpO2: 99% 97% 97% 97%  Weight:      Height:        Intake/Output Summary (Last 24 hours) at 11/19/2023 1044 Last data filed at 11/19/2023 0400 Gross per 24 hour  Intake 241.85 ml  Output --  Net 241.85 ml      11/17/2023    9:06 AM 11/10/2023    8:16 PM 06/24/2023    1:24 PM  Last 3 Weights  Weight (lbs) 179 lb 14.3 oz 180 lb 187 lb 6.4 oz  Weight (kg) 81.6 kg 81.647 kg 85.004 kg      Telemetry/ECG    NSR PAC/PVCs, HR 60s - Personally Reviewed  Physical Exam .   GEN: No acute distress.   Neck: No JVD Cardiac: RRR, no murmurs, rubs, or gallops.  Respiratory: Clear to auscultation bilaterally. GI: Soft, nontender, non-distended  MS: No edema  Assessment & Plan .    Elevated troponin - no known h/o CAD - HS troponin [id number] - EKG without ischemic changes - no angina reported - Limited echo showed preserved EF - continue IV heparin for 48 hours with plan to transition back to PTA Eliquis. Can stop IV hepatin this afternoon - Hgb 7-8, continue to monitor - suspect demand ischemia in the setting of recurrent acute CVA - no plan for invasive cardiac work-up given negative EKG, no symptoma and presumed metastatic cancer in the setting of recurrent CVA. Further ischemic work-up as OP  Paroxysmal Afib - he remains in NSR - restart PTA diltiazem 120mg  daily as BP allows - IV heparin with plan to transition back to Eliquis  HLD - LDL 131 - PTA  Lipitor 80mg  daily  Recurrent CVA - admitted in [facility] for embolic CVA - new infarcts on imaging - patient was having difficulty tolerating Eliquis due to N/V - on IV heparin - neurology following  H/o breast cancer with suspected metastasis  - treated as OP - liver lesion with concern for metastatic etiology s/p biopsy  For questions or updates, please contact [facility] Please consult [email or web address] for contact info under        Signed, Anvika Gashi David Stall, PA-C

## 2023-11-19 NOTE — Progress Notes (Signed)
 PROGRESS NOTE    Debra [name]  ZOX:[id number] DOB: 1955-11-09 DOA: 11/17/2023 PCP: Barbette Reichmann, MD    Brief Narrative:  68 y.o. 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  Patient noted to have been recently admitted March 25 to March 31 for issues including intractable nausea and vomiting, acute embolic CVA, transaminitis with new liver lesions.  Had fairly extensive workup at that time with MRI showing small acute infarcts posterior left MCA distribution with concern for hypercoagulable state versus malignancy versus cardiac emboli.  Neurology was formally evaluated.  Patient was placed on IV heparin drip until biopsy was completed in the setting of liver lesion.  Oncology did not feel that patient had Eliquis failure.  However thought that patient had a hard time absorbing medication secondary to recurrent nausea vomiting and diarrhea.  Was discharged on Eliquis.  Per report, patient had significant nausea and malaise at home.  No true chest pain.  No focal diaphoresis or sweating.  No belly pain or diarrhea.  Patient with noted predominate weakness slurred speech and right facial droop.  Has not had any missed dose of Eliquis.   Assessment & Plan:   Principal Problem:   Recurrent cerebrovascular accidents (CVAs) (HCC) Active Problems:   NSTEMI (non-ST elevated myocardial infarction) (HCC)   History of CVA (cerebrovascular accident)   Paroxysmal atrial fibrillation (HCC)   Hyperlipidemia   COPD with chronic bronchitis (HCC)   History of breast cancer   Elevated troponin   Acute CVA (cerebrovascular accident) (HCC)  Elevated troponin Cardiology following.  Troponin elevation relatively mild.  Nonspecific.  Favored to reflect  supply/demand mismatch in the setting of recurrent CVA.  No EKG changes or chest pain to suggest true NSTEMI. Plan: Continue heparin GTT x 48 hours.  Once patient undergoes biopsy can transition to either DOAC or potentially to SQ Lovenox.  Defer to oncology for final recommendations.  History of CVA (cerebrovascular accident) New cerebellar CVA Admitted March 25 through March 21 for noted embolic CVA. New infarcts noted in cerebellum on repeat image Not considered a treatment failure for Eliquis as patient is having difficulty tolerating due to intractable nausea and vomiting Suspect poor absorption as a result Plan: Continue heparin GTT for now.  Pending biopsy.  Transition to SQ Lovenox versus DOAC postprocedure  Paroxysmal atrial fibrillation (HCC) Home Eliquis held.  Continue home diltiazem.  Continue heparin GTT     History of breast cancer Baseline history of breast cancer followed by Dr. Cathie Hoops Noted liver lesion with concern for metastatic etiology status post biopsy during recent admission.  Last biopsies with indeterminate diagnosis.  Oncology recommending repeat biopsy. Plan: IR consulted for repeat biopsy today   COPD with chronic bronchitis (HCC) Stable from a respiratory standpoint Continue home inhalers   Hyperlipidemia Cont statin      DVT prophylaxis: IV heparin Code Status: Full Family Communication: None today Disposition Plan: Status is: Inpatient Remains inpatient appropriate because: Acute CVA.  Elevated troponin on heparin gtt.     Level of care: Telemetry Cardiac  Consultants:  Cardiology Neurology  Procedures:  None  Antimicrobials: None   Subjective: Seen and examined.  Sitting up in chair.  Dysarthria improving.  Objective: Vitals:   11/18/23 2315 11/19/23 0400 11/19/23 0740 11/19/23 1100  BP: (!) 115/51 Marland Kitchen)  113/59 (!) 107/50 (!) 113/51  Pulse:   68 73  Resp: 18 20    Temp: 98.3 F (36.8 C) 97.9 F (36.6 C) 97.9 F (36.6 C)  98.3 F (36.8 C)  TempSrc: Oral Oral Oral Oral  SpO2: 97% 97% 97% 100%  Weight:      Height:        Intake/Output Summary (Last 24 hours) at 11/19/2023 1301 Last data filed at 11/19/2023 0400 Gross per 24 hour  Intake 241.85 ml  Output --  Net 241.85 ml   Filed Weights   11/17/23 0906  Weight: 81.6 kg    Examination:  General exam: No acute distress Respiratory system: Lungs clear.  Normal work of breathing.  Room air Cardiovascular system: S1-S2, RRR, no murmurs, no pedal edema Gastrointestinal system:, NT/ND, normal bowel sounds Central nervous system: Alert and oriented.  Speech dysarthric Extremities: Symmetric 5 x 5 power. Skin: No rashes, lesions or ulcers Psychiatry: Judgement and insight appear normal. Mood & affect appropriate.     Data Reviewed: I have personally reviewed following labs and imaging studies  CBC: Recent Labs  Lab 11/15/23 0541 11/16/23 0341 11/17/23 0910 11/18/23 0250 11/19/23 0413  WBC 11.0* 11.0* 11.7* 10.0 9.3  NEUTROABS  --   --  9.5*  --   --   HGB 8.1* 7.9* 9.2* 8.1* 7.8*  HCT 24.3* 23.2* 27.7* 24.0* 24.0*  MCV 89.3 89.6 92.3 90.6 93.0  PLT 266 246 204 183 184   Basic Metabolic Panel: Recent Labs  Lab 11/13/23 0157 11/17/23 0910  NA 142 139  K 3.7 3.6  CL 108 102  CO2 27 29  GLUCOSE 110* 122*  BUN 9 15  CREATININE 0.81 0.90  CALCIUM 8.7* 9.0   GFR: Estimated Creatinine Clearance: 61 mL/min (by C-G formula based on SCr of 0.9 mg/dL). Liver Function Tests: Recent Labs  Lab 11/13/23 0157 11/17/23 0910  AST 68* 42*  ALT 113* 67*  ALKPHOS 49 49  BILITOT 0.6 0.7  PROT 6.2* 6.9  ALBUMIN 2.9* 3.2*   Recent Labs  Lab 11/17/23 0910  LIPASE 49   No results for input(s): "AMMONIA" in the last 168 hours. Coagulation Profile: Recent Labs  Lab 11/13/23 0157 11/16/23 0341 11/17/23 0910  INR 1.6* 1.1 1.1   Cardiac Enzymes: No results for input(s): "CKTOTAL", "CKMB", "CKMBINDEX", "TROPONINI" in the last 168  hours. BNP (last 3 results) No results for input(s): "PROBNP" in the last 8760 hours. HbA1C: No results for input(s): "HGBA1C" in the last 72 hours. CBG: No results for input(s): "GLUCAP" in the last 168 hours.  Lipid Profile: No results for input(s): "CHOL", "HDL", "LDLCALC", "TRIG", "CHOLHDL", "LDLDIRECT" in the last 72 hours. Thyroid Function Tests: No results for input(s): "TSH", "T4TOTAL", "FREET4", "T3FREE", "THYROIDAB" in the last 72 hours. Anemia Panel: No results for input(s): "VITAMINB12", "FOLATE", "FERRITIN", "TIBC", "IRON", "RETICCTPCT" in the last 72 hours. Sepsis Labs: No results for input(s): "PROCALCITON", "LATICACIDVEN" in the last 168 hours.  Recent Results (from the past 240 hours)  Resp panel by RT-PCR (RSV, Flu A&B, Covid) Anterior Nasal Swab     Status: None   Collection Time: 11/10/23 11:05 PM   Specimen: Anterior Nasal Swab  Result Value Ref Range Status   SARS Coronavirus 2 by RT PCR NEGATIVE NEGATIVE Final    Comment: (NOTE) SARS-CoV-2 target nucleic acids are NOT DETECTED.  The SARS-CoV-2 RNA is generally detectable in upper respiratory specimens during the acute phase of infection. The lowest concentration of SARS-CoV-2 viral  copies this assay can detect is 138 copies/mL. A negative result does not preclude SARS-Cov-2 infection and should not be used as the sole basis for treatment or other patient management decisions. A negative result may occur with  improper specimen collection/handling, submission of specimen other than nasopharyngeal swab, presence of viral mutation(s) within the areas targeted by this assay, and inadequate number of viral copies(<138 copies/mL). A negative result must be combined with clinical observations, patient history, and epidemiological information. The expected result is Negative.  Fact Sheet for Patients:  BloggerCourse.com  Fact Sheet for Healthcare Providers:   SeriousBroker.it  This test is no t yet approved or cleared by the [place] FDA and  has been authorized for detection and/or diagnosis of SARS-CoV-2 by FDA under an Emergency Use Authorization (EUA). This EUA will remain  in effect (meaning this test can be used) for the duration of the COVID-19 declaration under Section 564(b)(1) of the Act, 21 U.S.C.section 360bbb-3(b)(1), unless the authorization is terminated  or revoked sooner.       Influenza A by PCR NEGATIVE NEGATIVE Final   Influenza B by PCR NEGATIVE NEGATIVE Final    Comment: (NOTE) The Xpert Xpress SARS-CoV-2/FLU/RSV plus assay is intended as an aid in the diagnosis of influenza from Nasopharyngeal swab specimens and should not be used as a sole basis for treatment. Nasal washings and aspirates are unacceptable for Xpert Xpress SARS-CoV-2/FLU/RSV testing.  Fact Sheet for Patients: BloggerCourse.com  Fact Sheet for Healthcare Providers: SeriousBroker.it  This test is not yet approved or cleared by the [place] FDA and has been authorized for detection and/or diagnosis of SARS-CoV-2 by FDA under an Emergency Use Authorization (EUA). This EUA will remain in effect (meaning this test can be used) for the duration of the COVID-19 declaration under Section 564(b)(1) of the Act, 21 U.S.C. section 360bbb-3(b)(1), unless the authorization is terminated or revoked.     Resp Syncytial Virus by PCR NEGATIVE NEGATIVE Final    Comment: (NOTE) Fact Sheet for Patients: BloggerCourse.com  Fact Sheet for Healthcare Providers: SeriousBroker.it  This test is not yet approved or cleared by the [place] FDA and has been authorized for detection and/or diagnosis of SARS-CoV-2 by FDA under an Emergency Use Authorization (EUA). This EUA will remain in effect (meaning this test can be used) for  the duration of the COVID-19 declaration under Section 564(b)(1) of the Act, 21 U.S.C. section 360bbb-3(b)(1), unless the authorization is terminated or revoked.  Performed at [facility], [address] [place], [place] [address]   Blood culture (routine x 2)     Status: None   Collection Time: 11/11/23 12:10 AM   Specimen: BLOOD  Result Value Ref Range Status   Specimen Description BLOOD LEFT ANTECUBITAL  Final   Special Requests   Final    BOTTLES DRAWN AEROBIC AND ANAEROBIC Blood Culture adequate volume   Culture   Final    NO GROWTH 5 DAYS Performed at [facility], [address] [place], [place] [address]    Report Status 11/16/2023 FINAL  Final  Blood culture (routine x 2)     Status: None   Collection Time: 11/11/23 12:39 AM   Specimen: BLOOD  Result Value Ref Range Status   Specimen Description BLOOD BLOOD RIGHT HAND  Final   Special Requests   Final    BOTTLES DRAWN AEROBIC AND ANAEROBIC Blood Culture adequate volume   Culture   Final    NO GROWTH 5 DAYS Performed at  [facility], [address], [place], [place] [address]    Report Status 11/16/2023 FINAL  Final  MRSA Next Gen by PCR, Nasal     Status: None   Collection Time: 11/12/23  1:40 AM   Specimen: Nasal Mucosa; Nasal Swab  Result Value Ref Range Status   MRSA by PCR Next Gen NOT DETECTED NOT DETECTED Final    Comment: (NOTE) The GeneXpert MRSA Assay (FDA approved for NASAL specimens only), is one component of a comprehensive MRSA colonization surveillance program. It is not intended to diagnose MRSA infection nor to guide or monitor treatment for MRSA infections. Test performance is not FDA approved in patients less than 66 years old. Performed at [facility], [address], [place], [place] [address]          Radiology Studies: ECHOCARDIOGRAM LIMITED Result Date: 11/18/2023    ECHOCARDIOGRAM LIMITED REPORT   Patient Name:   Debra [name] Date of  Exam: 11/17/2023 Medical Rec #:  [id number]    Height:       64.0 in Accession #:    [id number]   Weight:       179.9 lb Date of Birth:  1955-12-29     BSA:          1.870 m Patient Age:    67 years     BP:           114/88 mmHg Patient Gender: F            HR:           84 bpm. Exam Location:  [facility] Procedure: 2D Echo, Limited Echo, Limited Color Doppler and Cardiac Doppler            (Both Spectral and Color Flow Doppler were utilized during            procedure). Indications:     Elevated Troponin  History:         Patient has prior history of Echocardiogram examinations, most                  recent 11/11/2023. COPD, Arrythmias:Atrial Fibrillation; Risk                  Factors:Hypertension and Dyslipidemia. Obstructive sleep apnea.                  Fatigue. Breast Cancer.  Sonographer:     Daphine Deutscher RDCS Referring Phys:  [id number] Raymon Mutton DUNN Diagnosing Phys: Yvonne Kendall MD IMPRESSIONS  1. Left ventricular ejection fraction, by estimation, is 60 to 65%. The left ventricle has normal function. The left ventricle has no regional wall motion abnormalities. Left ventricular diastolic parameters were normal.  2. Right ventricular systolic function is normal. The right ventricular size is normal. Tricuspid regurgitation signal is inadequate for assessing PA pressure.  3. The mitral valve is degenerative. At least moderate mitral valve regurgitation, which may be underestimated due to jet eccentricity. No evidence of mitral stenosis.  4. Aortic valve regurgitation is not visualized. No aortic stenosis is present.  5. The inferior vena cava is normal in size with greater than 50% respiratory variability, suggesting right atrial pressure of 3 mmHg. FINDINGS  Left Ventricle: Left ventricular ejection fraction, by estimation, is 60 to 65%. The left ventricle has normal function. The left ventricle has no regional wall motion abnormalities. The left ventricular internal cavity size was normal in size. There is  no  left ventricular hypertrophy. Left  ventricular diastolic parameters were normal. Right Ventricle: The right ventricular size is normal. No increase in right ventricular wall thickness. Right ventricular systolic function is normal. Tricuspid regurgitation signal is inadequate for assessing PA pressure. Pericardium: There is no evidence of pericardial effusion. Mitral Valve: The mitral valve is degenerative in appearance. There is moderate thickening of the mitral valve leaflet(s). Moderate mitral valve regurgitation, with wall-impinging jet. No evidence of mitral valve stenosis. Tricuspid Valve: The tricuspid valve is normal in structure. Tricuspid valve regurgitation is trivial. Aortic Valve: Aortic valve regurgitation is not visualized. No aortic stenosis is present. Aortic valve mean gradient measures 6.0 mmHg. Aortic valve peak gradient measures 13.5 mmHg. Aorta: The aortic root is normal in size and structure. Venous: The inferior vena cava is normal in size with greater than 50% respiratory variability, suggesting right atrial pressure of 3 mmHg. LEFT VENTRICLE PLAX 2D LVIDd:         5.20 cm Diastology LVIDs:         2.80 cm LV e' medial:    12.35 cm/s LV PW:         0.70 cm LV E/e' medial:  8.9 LV IVS:        0.70 cm LV e' lateral:   15.75 cm/s                        LV E/e' lateral: 7.0  RIGHT VENTRICLE             IVC RV S prime:     19.85 cm/s  IVC diam: 1.60 cm LEFT ATRIUM         Index LA diam:    3.70 cm 1.98 cm/m  AORTIC VALVE AV Vmax:      183.59 cm/s AV Vmean:     114.120 cm/s AV VTI:       0.317 m AV Peak Grad: 13.5 mmHg AV Mean Grad: 6.0 mmHg  AORTA Ao Root diam: 3.40 cm MITRAL VALVE MV Area (PHT): 3.63 cm MV VTI:        0.40 m MV Decel Time: 209 msec MV E velocity: 109.67 cm/s MV A velocity: 142.67 cm/s MV E/A ratio:  0.77 Cristal Deer End MD Electronically signed by Yvonne Kendall MD Signature Date/Time: 11/18/2023/5:49:00 AM    Final    MR BRAIN WO CONTRAST Result Date: 11/17/2023 CLINICAL  DATA:  Provided history: Neuro deficit, acute, stroke suspected. Possible new versus progressed CVA on CTA. Weakness. Aphasia. Facial droop. EXAM: MRI HEAD WITHOUT CONTRAST TECHNIQUE: Multiplanar, multiecho pulse sequences of the brain and surrounding structures were obtained without intravenous contrast. COMPARISON:  Head CT 11/17/2023. CT angiogram head/neck 11/11/2023. Brain MRI 11/11/2023. FINDINGS: Brain: No age-advanced or lobar predominant cerebral atrophy. Numerous small acute infarcts within the left cerebellar hemisphere and left superior cerebellar peduncle, the majority of which are new from the prior MRI of 11/11/2023. Several new small acute cortical infarcts are present within the right frontal, right parietal, left parietal and left occipital lobes. A new punctate acute infarct is also present within the left thalamus. Known patchy acute/early subacute infarcts elsewhere within the left frontal and left parietal lobes (MCA vascular territory). 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 Background mild multifocal T2 FLAIR hyperintense signal abnormality within the cerebral white matter, nonspecific but compatible with chronic small vessel ischemic disease. Punctate chronic microhemorrhage within the left occipital lobe, unchanged (series 10, image 25). No evidence of an intracranial mass. No extra-axial fluid collection. No  midline shift. Vascular: Please see the recent prior CTA head/neck 11/11/2023. Small foci of susceptibility-weighted signal loss along the left frontal lobe, new from the prior MRI and suspicious for emboli within distal left MCA branches (series 10, image 46). Skull and upper cervical spine: No focal worrisome marrow lesion. Incompletely assessed cervical spondylosis. Sinuses/Orbits: No mass or acute finding within the imaged orbits. Prior bilateral ocular lens replacement. Minimal mucosal thickening within the right maxillary sinus. IMPRESSION: 1.  Numerous small acute infarcts within the left cerebellar hemisphere/superior cerebellar peduncle, the majority of which are new from the prior brain MRI of 11/11/2023. Additionally, there are several new small acute cortical infarcts within the right frontal, right parietal, left parietal and left occipital lobes, as well as a new punctate acute infarct within the left thalamus. Involvement of multiple vascular territories concerning for an embolic process. New small foci of signal abnormality along the left frontal lobe suspicious for emboli within distal left middle cerebral artery branches. 2. Known patchy acute/early subacute infarcts elsewhere within the left frontal and left parietal lobes (MCA vascular territory). 3. Background mild cerebral white matter chronic small vessel ischemic disease. 4. Unchanged chronic lacunar infarct within the right caudate nucleus. Electronically Signed   By: Jackey Loge D.O.   On: 11/17/2023 16:31        Scheduled Meds:  feeding supplement  237 mL Oral TID BM   melatonin  10 mg Oral QHS   Continuous Infusions:  heparin 1,300 Units/hr (11/19/23 0615)     LOS: 1 day     Tresa Moore, MD [facility]   If 7PM-7AM, please contact night-coverage  11/19/2023, 1:01 PM

## 2023-11-20 ENCOUNTER — Encounter: Payer: Self-pay | Admitting: Oncology

## 2023-11-20 ENCOUNTER — Other Ambulatory Visit (HOSPITAL_COMMUNITY): Payer: Self-pay

## 2023-11-20 ENCOUNTER — Other Ambulatory Visit: Payer: Self-pay

## 2023-11-20 ENCOUNTER — Telehealth (HOSPITAL_COMMUNITY): Payer: Self-pay

## 2023-11-20 DIAGNOSIS — I639 Cerebral infarction, unspecified: Secondary | ICD-10-CM | POA: Diagnosis not present

## 2023-11-20 LAB — BASIC METABOLIC PANEL WITH GFR
Anion gap: 8 (ref 5–15)
BUN: 13 mg/dL (ref 8–23)
CO2: 31 mmol/L (ref 22–32)
Calcium: 9.2 mg/dL (ref 8.9–10.3)
Chloride: 101 mmol/L (ref 98–111)
Creatinine, Ser: 0.92 mg/dL (ref 0.44–1.00)
GFR, Estimated: >60 mL/min (ref >60)
Glucose, Bld: 109 mg/dL — ABNORMAL HIGH (ref 70–99)
Potassium: 3.7 mmol/L (ref 3.5–5.1)
Sodium: 140 mmol/L (ref 135–145)

## 2023-11-20 LAB — CBC WITH DIFFERENTIAL/PLATELET
Abs Immature Granulocytes: 0.06 10*3/uL (ref 0.00–0.07)
Basophils Absolute: 0 10*3/uL (ref 0.0–0.1)
Basophils Relative: 0 %
Eosinophils Absolute: 0.2 10*3/uL (ref 0.0–0.5)
Eosinophils Relative: 3 %
HCT: 23.8 % — ABNORMAL LOW (ref 36.0–46.0)
Hemoglobin: 8.1 g/dL — ABNORMAL LOW (ref 12.0–15.0)
Immature Granulocytes: 1 %
Lymphocytes Relative: 21 %
Lymphs Abs: 1.9 10*3/uL (ref 0.7–4.0)
MCH: 31 pg (ref 26.0–34.0)
MCHC: 34 g/dL (ref 30.0–36.0)
MCV: 91.2 fL (ref 80.0–100.0)
Monocytes Absolute: 0.7 10*3/uL (ref 0.1–1.0)
Monocytes Relative: 8 %
Neutro Abs: 6.1 10*3/uL (ref 1.7–7.7)
Neutrophils Relative %: 67 %
Platelets: 184 10*3/uL (ref 150–400)
RBC: 2.61 MIL/uL — ABNORMAL LOW (ref 3.87–5.11)
RDW: 14.7 % (ref 11.5–15.5)
WBC: 9 10*3/uL (ref 4.0–10.5)
nRBC: 0 % (ref 0.0–0.2)

## 2023-11-20 LAB — RETIC PANEL
Immature Retic Fract: 38.3 % — ABNORMAL HIGH (ref 2.3–15.9)
RBC.: 2.64 MIL/uL — ABNORMAL LOW (ref 3.87–5.11)
Retic Count, Absolute: 141.5 10*3/uL (ref 19.0–186.0)
Retic Ct Pct: 5.4 % — ABNORMAL HIGH (ref 0.4–3.1)
Reticulocyte Hemoglobin: 33.8 pg (ref >27.9)

## 2023-11-20 LAB — CBC
HCT: 24 % — ABNORMAL LOW (ref 36.0–46.0)
Hemoglobin: 8 g/dL — ABNORMAL LOW (ref 12.0–15.0)
MCH: 30.8 pg (ref 26.0–34.0)
MCHC: 33.3 g/dL (ref 30.0–36.0)
MCV: 92.3 fL (ref 80.0–100.0)
Platelets: 199 10*3/uL (ref 150–400)
RBC: 2.6 MIL/uL — ABNORMAL LOW (ref 3.87–5.11)
RDW: 15 % (ref 11.5–15.5)
WBC: 8.6 10*3/uL (ref 4.0–10.5)
nRBC: 0 % (ref 0.0–0.2)

## 2023-11-20 LAB — APTT: aPTT: 102 s — ABNORMAL HIGH (ref 24–36)

## 2023-11-20 LAB — SURGICAL PATHOLOGY

## 2023-11-20 LAB — HEPARIN LEVEL (UNFRACTIONATED): Heparin Unfractionated: 0.94 [IU]/mL — ABNORMAL HIGH (ref 0.30–0.70)

## 2023-11-20 MED ORDER — PROMETHAZINE HCL 25 MG PO TABS: 25.0000 mg | ORAL_TABLET | Freq: Four times a day (QID) | ORAL | 0 refills | Status: DC | PRN

## 2023-11-20 MED ORDER — ENOXAPARIN SODIUM 80 MG/0.8ML IJ SOSY
80.0000 mg | PREFILLED_SYRINGE | Freq: Two times a day (BID) | INTRAMUSCULAR | Status: DC
  Administered 2023-11-20: 80 mg via SUBCUTANEOUS
  Filled 2023-11-20: qty 0.8

## 2023-11-20 MED ORDER — ENOXAPARIN SODIUM 80 MG/0.8ML IJ SOSY: 80.0000 mg | PREFILLED_SYRINGE | Freq: Two times a day (BID) | INTRAMUSCULAR | 0 refills | Status: DC

## 2023-11-20 MED ORDER — APIXABAN 5 MG PO TABS: 5.0000 mg | ORAL_TABLET | Freq: Two times a day (BID) | ORAL | Status: DC

## 2023-11-20 MED ORDER — SHARPS CONTAINER MISC: 1.0000 | 1 refills | Status: DC | PRN

## 2023-11-20 NOTE — Progress Notes (Signed)
     [facility] REFERRAL        Occupational Therapy * Physical Therapy * Speech Therapy                           DATE 11/20/23  PATIENT NAME Debra [name]  PATIENT MRN  [id number]        DIAGNOSIS/DIAGNOSIS CODE  INTRACRANIAL HEMORRHAGE OR CEREBRAL INFARCTION WITH MCC064, INTRACRANIAL HEMORRHAGE OR CEREBRAL INFARCTION WITH MCC06   DATE OF DISCHARGE: 11/20/2023       PRIMARY CARE PHYSICIAN      PCP PHONE/FAX      Dear Provider (Name: [facility] outpatient __  Fax: [phone]   I certify that I have examined this patient and that occupational/physical/speech therapy is necessary on an outpatient basis.    The patient has expressed interest in completing their recommended course of therapy at your  location.  Once a formal order from the patient's primary care physician has been obtained, please  contact him/her to schedule an appointment for evaluation at your earliest convenience.   [ x]  Physical Therapy Evaluate and Treat  [  x]  Occupational Therapy Evaluate and Treat  [  x]  Speech Therapy Evaluate and Treat         The patient's primary care physician (listed above) must furnish and be responsible for a formal order such that the recommended services may be furnished while under the primary physician's care, and that the plan of care will be established and reviewed every 30 days (or more often if condition necessitates).
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
## 2023-11-20 NOTE — Progress Notes (Addendum)
 Inpatient Rehab Admissions Coordinator:  Attempted to contact pt's husband Kathlene November. Left a message. Awaiting return call. Will continue to follow.  11:13: Spoke with pt's husband regarding CIR. He acknowledged  understanding of CIR goals and expectations. He said the decision regarding CIR is up to the patient.  Was notified by Dr. Georgeann Oppenheim that pt would prefer to discharge home and receive HH/outpatient therapy. AC will sign off.   Wolfgang Phoenix, MS, CCC-SLP Admissions Coordinator [phone]

## 2023-11-20 NOTE — Progress Notes (Addendum)
   Patient Name: Debra [name] Date of Encounter: 11/20/2023 [facility] Cardiologist: Debbe Odea, MD   Interval Summary  .    Patient is overall feeling better. She remains in NSR. No chest pain or SOB reported.   Vital Signs .    Vitals:   11/19/23 1742 11/19/23 2029 11/19/23 2354 11/20/23 0455  BP: (!) 120/55 (!) 103/47 100/88 114/62  Pulse: 85 77 86 67  Resp:  20 20 20   Temp: 98.2 F (36.8 C) 98.8 F (37.1 C) 98.2 F (36.8 C) 97.9 F (36.6 C)  TempSrc:      SpO2: 97% 98% 100% 98%  Weight:      Height:       No intake or output data in the 24 hours ending 11/20/23 0740    11/17/2023    9:06 AM 11/10/2023    8:16 PM 06/24/2023    1:24 PM  Last 3 Weights  Weight (lbs) 179 lb 14.3 oz 180 lb 187 lb 6.4 oz  Weight (kg) 81.6 kg 81.647 kg 85.004 kg      Telemetry/ECG    NSR HR 70s - Personally Reviewed  Physical Exam .   GEN: No acute distress.   Neck: No JVD Cardiac: RRR, no murmurs, rubs, or gallops.  Respiratory: Clear to auscultation bilaterally. GI: Soft, nontender, non-distended  MS: No edema  Assessment & Plan .    Elevated troponin - no known h/o CAD - HS troponin [phone] - EKG without ischemic changes - no angina reported - Limited echo showed preserved EF - she completed IV heparin for 48 hours with plan to transition to Lovenox injections - Hgb 7-8, continue to monitor - suspect demand ischemia in the setting of recurrent acute CVA - no plan for invasive cardiac work-up given negative EKG, no symptoma and presumed metastatic cancer in the setting of recurrent CVA. Further ischemic work-up as OP   Paroxysmal Afib - she remains in NSR - restart PTA diltiazem 120mg  daily as BP allows - transition to Lovenox as above   HLD - LDL 131 - PTA Lipitor 80mg  daily   Recurrent CVA - admitted in [facility] for embolic CVA - new infarcts on imaging this admission - plan for Loven x injections as above - neurology following   For  questions or updates, please contact [facility] Please consult [email or web address] for contact info under        Signed, Keaisha Sublette David Stall, PA-C

## 2023-11-20 NOTE — Discharge Summary (Signed)
 Physician Discharge Summary  Debra [name] EXB:[id number] DOB: 1956-02-04 DOA: 11/17/2023  PCP: Barbette Reichmann, MD  Admit date: 11/17/2023 Discharge date: 11/20/2023  Admitted From: Home Disposition:  Home  Recommendations for Outpatient Follow-up:  Follow up with PCP in 1-2 weeks Ambulatory referral to neurology Ambulatory referral for outpatient PT OT SLP  Home Health:No  Equipment/Devices:None  Discharge Condition:Stable CODE STATUS:Full  Diet recommendation: Heart healthy  Brief/Interim Summary:  68 y.o. 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  Patient noted to have been recently admitted March 25 to March 31 for issues including intractable nausea and vomiting, acute embolic CVA, transaminitis with new liver lesions.  Had fairly extensive workup at that time with MRI showing small acute infarcts posterior left MCA distribution with concern for hypercoagulable state versus malignancy versus cardiac emboli.  Neurology was formally evaluated.  Patient was placed on IV heparin drip until biopsy was completed in the setting of liver lesion.  Oncology did not feel that patient had Eliquis failure.  However thought that patient had a hard time absorbing medication secondary to recurrent nausea vomiting and diarrhea.  Was discharged on Eliquis.  Per report, patient had significant nausea and malaise at home.  No true chest pain.  No focal diaphoresis or sweating.  No belly pain or diarrhea.  Patient with noted predominate weakness slurred speech and right facial droop.  Has not had any missed dose of Eliquis.   Discharge Diagnoses:  Principal Problem:   Recurrent cerebrovascular accidents (CVAs) (HCC) Active Problems:   NSTEMI (non-ST  elevated myocardial infarction) (HCC)   History of CVA (cerebrovascular accident)   Paroxysmal atrial fibrillation (HCC)   Hyperlipidemia   COPD with chronic bronchitis (HCC)   History of breast cancer   Elevated troponin   Acute CVA (cerebrovascular accident) (HCC)   Nausea and vomiting  Elevated troponin Cardiology following.  Troponin elevation relatively mild.  Nonspecific.  Favored to reflect supply/demand mismatch in the setting of recurrent CVA.  No EKG changes or chest pain to suggest true NSTEMI. Plan: Completed heparin GTT for 48 hours.  Transition to subcutaneous Lovenox 1 mg/kg.  Medically ready for discharge.     History of CVA (cerebrovascular accident) New cerebellar CVA Admitted March 25 through March 21 for noted embolic CVA. New infarcts noted in cerebellum on repeat image Not considered a treatment failure for Eliquis as patient is having difficulty tolerating due to intractable nausea and vomiting Suspect poor absorption as a result Plan: [facility].  Patient and son has declined after lengthy discussion with them at bedside.  Discussed with PT and OT.  They recommend outpatient therapy services as this will provide a more intensive rehab environment.  Outpatient ambulatory referrals for PT, OT, SLP placed at time of discharge   Paroxysmal atrial fibrillation (HCC) Eliquis discontinued in favor of weight-based Lovenox.  Continue home diltiazem.     History of breast cancer Baseline history of breast cancer followed by Dr. Cathie Hoops Noted liver lesion with concern for metastatic etiology status post biopsy during recent admission.  Last biopsies with indeterminate diagnosis.  Oncology recommending repeat biopsy. Plan: Repeat biopsy completed 4/3.  Oncology follow-up for results.  Oncology to arrange outpatient follow-up in cancer center   COPD with chronic bronchitis (HCC) Stable from  a respiratory standpoint Continue home  inhalers   Hyperlipidemia Cont statin    Discharge Instructions  Discharge Instructions     Ambulatory referral to Neurology   Complete by: As directed    Ambulatory referral to Occupational Therapy   Complete by: As directed    Ambulatory referral to Physical Therapy   Complete by: As directed    Ambulatory referral to Speech Therapy   Complete by: As directed    Diet - low sodium heart healthy   Complete by: As directed    Increase activity slowly   Complete by: As directed       Allergies as of 11/20/2023       Reactions   Cephalexin Hives   Strawberry Extract Hives        Medication List     STOP taking these medications    apixaban 5 MG Tabs tablet Commonly known as: ELIQUIS       TAKE these medications    acetaminophen 500 MG tablet Commonly known as: TYLENOL Take 1,500 mg by mouth 2 (two) times daily as needed for moderate pain.   alendronate 70 MG tablet Commonly known as: FOSAMAX Take 70 mg by mouth every Sunday.   atorvastatin 80 MG tablet Commonly known as: LIPITOR Take 1 tablet (80 mg total) by mouth daily.   citalopram 10 MG tablet Commonly known as: CELEXA Take 10 mg by mouth daily.   diltiazem 120 MG 24 hr capsule Commonly known as: CARDIZEM CD Take 1 capsule (120 mg total) by mouth daily.   enoxaparin 80 MG/0.8ML injection Commonly known as: LOVENOX Inject 0.8 mLs (80 mg total) into the skin every 12 (twelve) hours.   feeding supplement Liqd Take 237 mLs by mouth 3 (three) times daily between meals.   Melatonin 5 MG Caps Take 15 mg by mouth at bedtime.   ondansetron 8 MG tablet Commonly known as: Zofran Take 1 tablet (8 mg total) by mouth every 8 (eight) hours as needed for nausea (Nausea or vomiting). Start on the third day after chemotherapy.   oxyCODONE 5 MG immediate release tablet Commonly known as: Oxy IR/ROXICODONE Take 5 mg by mouth every 6 (six) hours as needed for moderate pain (pain score 4-6).    pantoprazole 40 MG tablet Commonly known as: PROTONIX Take 1 tablet (40 mg total) by mouth daily.   pregabalin 150 MG capsule Commonly known as: LYRICA Take 1 capsule (150 mg total) by mouth 2 (two) times daily.   promethazine 25 MG tablet Commonly known as: PHENERGAN Take 1 tablet (25 mg total) by mouth every 6 (six) hours as needed for nausea, vomiting or refractory nausea / vomiting (not responding to zofran).   roflumilast 500 MCG Tabs tablet Commonly known as: DALIRESP Take 1 tablet by mouth daily.   sharps container 1 each by Does not apply route as needed.   tamoxifen 20 MG tablet Commonly known as: NOLVADEX Take 1 tablet (20 mg total) by mouth daily.   Trelegy Ellipta 100-62.5-25 MCG/INH Aepb Generic drug: Fluticasone-Umeclidin-Vilant Inhale 1 puff into the lungs daily.        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 Recent biopsy of a right supraclavicular lymph node but additional tissue is needed for diagnostic purposes. EXAM: ULTRASOUND-GUIDED CORE BIOPSY OF RIGHT SUPRACLAVICULAR LYMPH NODE MEDICATIONS: Moderate sedation ANESTHESIA/SEDATION: Moderate (  conscious) sedation was employed during this procedure. A total of Versed 1 mg and Fentanyl 50 mcg was administered intravenously by the radiology nurse. Total intra-service moderate Sedation Time: 15 minutes. The patient's level of consciousness and vital signs were monitored continuously by radiology nursing throughout the procedure under my direct supervision. FLUOROSCOPY TIME:  None COMPLICATIONS: None immediate. PROCEDURE: Informed written consent was obtained from the patient after a thorough discussion of the procedural risks, benefits and alternatives. All questions were addressed. A timeout was performed  prior to the initiation of the procedure. Ultrasound was used to identify enlarged right supraclavicular lymph nodes. Right supraclavicular area was prepped with chlorhexidine and sterile field was created. Skin was anesthetized with 1% lidocaine. Small incision was made. Using ultrasound guidance, 17 gauge coaxial needle was directed into a supraclavicular lymph node. Total of 5 core biopsies were obtained with an 18 gauge core device. Specimens placed in formalin. 17 gauge coaxial needle was removed. Bandage placed over the puncture site. FINDINGS: Multiple enlarged right supraclavicular lymph nodes. Core biopsy needle confirmed within the supraclavicular lymph node. No immediate bleeding or hematoma formation. IMPRESSION: Successful ultrasound-guided core biopsy of an enlarged right supraclavicular lymph node. Electronically Signed   By: Richarda Overlie M.D.   On: 11/19/2023 16:16   ECHOCARDIOGRAM LIMITED Result Date: 11/18/2023    ECHOCARDIOGRAM LIMITED REPORT   Patient Name:   Debra [name] Date of Exam: 11/17/2023 Medical Rec #:  [id number]    Height:       64.0 in Accession #:    [id number]   Weight:       179.9 lb Date of Birth:  15-Jan-1956     BSA:          1.870 m Patient Age:    34 years     BP:           114/88 mmHg Patient Gender: F            HR:           84 bpm. Exam Location:  [facility] Procedure: 2D Echo, Limited Echo, Limited Color Doppler and Cardiac Doppler            (Both Spectral and Color Flow Doppler were utilized during            procedure). Indications:     Elevated Troponin  History:         Patient has prior history of Echocardiogram examinations, most                  recent 11/11/2023. COPD, Arrythmias:Atrial Fibrillation; Risk                  Factors:Hypertension and Dyslipidemia. Obstructive sleep apnea.                  Fatigue. Breast Cancer.  Sonographer:     Daphine Deutscher RDCS Referring Phys:  [id number] Raymon Mutton DUNN Diagnosing Phys: Yvonne Kendall MD IMPRESSIONS  1. Left ventricular  ejection fraction, by estimation, is 60 to 65%. The left ventricle has normal function. The left ventricle has no regional wall motion abnormalities. Left ventricular diastolic parameters were normal.  2. Right ventricular systolic function is normal. The right ventricular size is normal. Tricuspid regurgitation signal is inadequate for assessing PA pressure.  3. The mitral valve is degenerative. At least moderate mitral valve regurgitation, which may be underestimated due to jet eccentricity. No evidence of mitral stenosis.  4. Aortic valve regurgitation is not  visualized. No aortic stenosis is present.  5. The inferior vena cava is normal in size with greater than 50% respiratory variability, suggesting right atrial pressure of 3 mmHg. FINDINGS  Left Ventricle: Left ventricular ejection fraction, by estimation, is 60 to 65%. The left ventricle has normal function. The left ventricle has no regional wall motion abnormalities. The left ventricular internal cavity size was normal in size. There is  no left ventricular hypertrophy. Left ventricular diastolic parameters were normal. Right Ventricle: The right ventricular size is normal. No increase in right ventricular wall thickness. Right ventricular systolic function is normal. Tricuspid regurgitation signal is inadequate for assessing PA pressure. Pericardium: There is no evidence of pericardial effusion. Mitral Valve: The mitral valve is degenerative in appearance. There is moderate thickening of the mitral valve leaflet(s). Moderate mitral valve regurgitation, with wall-impinging jet. No evidence of mitral valve stenosis. Tricuspid Valve: The tricuspid valve is normal in structure. Tricuspid valve regurgitation is trivial. Aortic Valve: Aortic valve regurgitation is not visualized. No aortic stenosis is present. Aortic valve mean gradient measures 6.0 mmHg. Aortic valve peak gradient measures 13.5 mmHg. Aorta: The aortic root is normal in size and structure.  Venous: The inferior vena cava is normal in size with greater than 50% respiratory variability, suggesting right atrial pressure of 3 mmHg. LEFT VENTRICLE PLAX 2D LVIDd:         5.20 cm Diastology LVIDs:         2.80 cm LV e' medial:    12.35 cm/s LV PW:         0.70 cm LV E/e' medial:  8.9 LV IVS:        0.70 cm LV e' lateral:   15.75 cm/s                        LV E/e' lateral: 7.0  RIGHT VENTRICLE             IVC RV S prime:     19.85 cm/s  IVC diam: 1.60 cm LEFT ATRIUM         Index LA diam:    3.70 cm 1.98 cm/m  AORTIC VALVE AV Vmax:      183.59 cm/s AV Vmean:     114.120 cm/s AV VTI:       0.317 m AV Peak Grad: 13.5 mmHg AV Mean Grad: 6.0 mmHg  AORTA Ao Root diam: 3.40 cm MITRAL VALVE MV Area (PHT): 3.63 cm MV VTI:        0.40 m MV Decel Time: 209 msec MV E velocity: 109.67 cm/s MV A velocity: 142.67 cm/s MV E/A ratio:  0.77 Cristal Deer End MD Electronically signed by Yvonne Kendall MD Signature Date/Time: 11/18/2023/5:49:00 AM    Final    MR BRAIN WO CONTRAST Result Date: 11/17/2023 CLINICAL DATA:  Provided history: Neuro deficit, acute, stroke suspected. Possible new versus progressed CVA on CTA. Weakness. Aphasia. Facial droop. EXAM: MRI HEAD WITHOUT CONTRAST TECHNIQUE: Multiplanar, multiecho pulse sequences of the brain and surrounding structures were obtained without intravenous contrast. COMPARISON:  Head CT 11/17/2023. CT angiogram head/neck 11/11/2023. Brain MRI 11/11/2023. FINDINGS: Brain: No age-advanced or lobar predominant cerebral atrophy. Numerous small acute infarcts within the left cerebellar hemisphere and left superior cerebellar peduncle, the majority of which are new from the prior MRI of 11/11/2023. Several new small acute cortical infarcts are present within the right frontal, right parietal, left parietal and left occipital lobes. A new punctate acute infarct is also present  within the left thalamus. Known patchy acute/early subacute infarcts elsewhere within the left frontal and left  parietal lobes (MCA vascular territory). 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 Background mild multifocal T2 FLAIR hyperintense signal abnormality within the cerebral white matter, nonspecific but compatible with chronic small vessel ischemic disease. Punctate chronic microhemorrhage within the left occipital lobe, unchanged (series 10, image 25). No evidence of an intracranial mass. No extra-axial fluid collection. No midline shift. Vascular: Please see the recent prior CTA head/neck 11/11/2023. Small foci of susceptibility-weighted signal loss along the left frontal lobe, new from the prior MRI and suspicious for emboli within distal left MCA branches (series 10, image 46). Skull and upper cervical spine: No focal worrisome marrow lesion. Incompletely assessed cervical spondylosis. Sinuses/Orbits: No mass or acute finding within the imaged orbits. Prior bilateral ocular lens replacement. Minimal mucosal thickening within the right maxillary sinus. IMPRESSION: 1. Numerous small acute infarcts within the left cerebellar hemisphere/superior cerebellar peduncle, the majority of which are new from the prior brain MRI of 11/11/2023. Additionally, there are several new small acute cortical infarcts within the right frontal, right parietal, left parietal and left occipital lobes, as well as a new punctate acute infarct within the left thalamus. Involvement of multiple vascular territories concerning for an embolic process. New small foci of signal abnormality along the left frontal lobe suspicious for emboli within distal left middle cerebral artery branches. 2. Known patchy acute/early subacute infarcts elsewhere within the left frontal and left parietal lobes (MCA vascular territory). 3. Background mild cerebral white matter chronic small vessel ischemic disease. 4. Unchanged chronic lacunar infarct within the right caudate nucleus. Electronically Signed   By: Jackey Loge D.O.   On:  11/17/2023 16:31   DG Chest Portable 1 View Result Date: 11/17/2023 CLINICAL DATA:  Vomiting.  Weakness. EXAM: PORTABLE CHEST 1 VIEW COMPARISON:  11/10/2023 FINDINGS: Bilateral lung fields are clear. Bilateral costophrenic angles are clear. Stable cardio-mediastinal silhouette. No acute osseous abnormalities. The soft tissues are within normal limits. IMPRESSION: No active disease. Electronically Signed   By: Jules Schick M.D.   On: 11/17/2023 11:49   CT Head Wo Contrast Result Date: 11/17/2023 CLINICAL DATA:  67 year old female neurologic deficit with slurred speech, right facial droop. Recent left MCA M2 occlusion, posterior left MCA infarcts, occasional other small scattered brain infarcts on CTA, MRI. EXAM: CT HEAD WITHOUT CONTRAST TECHNIQUE: Contiguous axial images were obtained from the base of the skull through the vertex without intravenous contrast. RADIATION DOSE REDUCTION: This exam was performed according to the departmental dose-optimization program which includes automated exposure control, adjustment of the mA and/or kV according to patient size and/or use of iterative reconstruction technique. COMPARISON:  Brain MRI 11/11/2023 and earlier FINDINGS: Brain: Posterior left MCA infarcts on MRI remain subtle by CT with no associated hemorrhage or mass effect (series 3, image 20). Small left cerebellum PICA territory infarct is now apparent on CT coronal image 52, and appears different from that on the recent MRI. No associated hemorrhage or mass effect. No acute intracranial hemorrhage identified. No midline shift, mass effect, or evidence of intracranial mass lesion. No ventriculomegaly. Vascular: Calcified atherosclerosis at the skull base. No suspicious intracranial vascular hyperdensity. Skull: Stable and intact. Sinuses/Orbits: Visualized paranasal sinuses and mastoids are stable and well aerated. Other: Mild rightward gaze. No other acute orbit or scalp soft tissue finding. IMPRESSION: 1. New  or progressed Left cerebellar PICA territory infarct since the MRI on 11/11/2023. 2. Stable CT appearance of  posterior left MCA territory infarcts. 3. No associated acute intracranial hemorrhage or mass effect. Electronically Signed   By: Odessa Fleming M.D.   On: 11/17/2023 10:15   [place] CORE BIOPSY (LYMPH NODES) Result Date: 11/16/2023 INDICATION: Right lower lobe lung mass, lymphadenopathy and liver lesions. Enlarged right supraclavicular lymph node targeted for biopsy. EXAM: ULTRASOUND GUIDED CORE BIOPSY OF RIGHT SUPRACLAVICULAR LYMPH NODE MEDICATIONS: None. ANESTHESIA/SEDATION: Moderate (conscious) sedation was employed during this procedure. A total of Versed 1.5 mg and Fentanyl 75 mcg was administered intravenously. Moderate Sedation Time: 19 minutes. The patient's level of consciousness and vital signs were monitored continuously by radiology nursing throughout the procedure under my direct supervision. PROCEDURE: The procedure, risks, benefits, and alternatives were explained to the patient. Questions regarding the procedure were encouraged and answered. The patient understands and consents to the procedure. A time-out was performed prior to initiating the procedure. The right neck was prepped with chlorhexidine in a sterile fashion, and a sterile drape was applied covering the operative field. A sterile gown and sterile gloves were used for the procedure. Local anesthesia was provided with 1% Lidocaine. After localizing an enlarged right supraclavicular lymph node, 18 gauge core biopsy samples were obtained through different portions of the lymph node. A total of 5 samples were obtained with 3 submitted on saline soaked Telfa gauze and 2 in formalin. Additional ultrasound was performed after biopsy. COMPLICATIONS: None immediate. FINDINGS: Several adjacent right supraclavicular lymph nodes are identified by ultrasound. The largest measures approximately 2.2 x 1.4 x 1.7 cm. Solid core biopsy samples were obtained.  IMPRESSION: Ultrasound-guided core biopsy performed of an enlarged right supraclavicular lymph node measuring 2.2 cm in greatest diameter by ultrasound. Electronically Signed   By: Irish Lack M.D.   On: 11/16/2023 16:02   ECHOCARDIOGRAM COMPLETE Result Date: 11/12/2023    ECHOCARDIOGRAM REPORT   Patient Name:   SURIAH PERAGINE Date of Exam: 11/11/2023 Medical Rec #:  [id number]    Height:       64.0 in Accession #:    [id number]   Weight:       180.0 lb Date of Birth:  12/16/1955     BSA:          1.871 m Patient Age:    56 years     BP:           110/92 mmHg Patient Gender: F            HR:           80 bpm. Exam Location:  [facility] Procedure: 2D Echo, Cardiac Doppler and Color Doppler (Both Spectral and Color            Flow Doppler were utilized during procedure). Indications:     Elevated Troponin  History:         Patient has prior history of Echocardiogram examinations, most                  recent 04/25/2022. COPD, Arrythmias:Atrial Fibrillation; Risk                  Factors:Hypertension and Dyslipidemia. Obstructive sleep apnea.  Sonographer:     Daphine Deutscher RDCS Referring Phys:  [id number] Wilfred Curtis DDUK Diagnosing Phys: Julien Nordmann MD IMPRESSIONS  1. Left ventricular ejection fraction, by estimation, is 60 to 65%. The left ventricle has normal function. The left ventricle has no regional wall motion abnormalities. There is mild left ventricular hypertrophy. Left ventricular diastolic parameters are consistent  with Grade I diastolic dysfunction (impaired relaxation).  2. Right ventricular systolic function is normal. The right ventricular size is normal. There is normal pulmonary artery systolic pressure. The estimated right ventricular systolic pressure is 21.3 mmHg.  3. The mitral valve is normal in structure. Mild to moderate mitral valve regurgitation. No evidence of mitral stenosis.  4. The aortic valve is normal in structure. Aortic valve regurgitation is not visualized. No aortic stenosis  is present.  5. The inferior vena cava is normal in size with greater than 50% respiratory variability, suggesting right atrial pressure of 3 mmHg. FINDINGS  Left Ventricle: Left ventricular ejection fraction, by estimation, is 60 to 65%. The left ventricle has normal function. The left ventricle has no regional wall motion abnormalities. Strain was performed and the global longitudinal strain is indeterminate. The left ventricular internal cavity size was normal in size. There is mild left ventricular hypertrophy. Left ventricular diastolic parameters are consistent with Grade I diastolic dysfunction (impaired relaxation). Right Ventricle: The right ventricular size is normal. No increase in right ventricular wall thickness. Right ventricular systolic function is normal. There is normal pulmonary artery systolic pressure. The tricuspid regurgitant velocity is 2.02 m/s, and  with an assumed right atrial pressure of 5 mmHg, the estimated right ventricular systolic pressure is 21.3 mmHg. Left Atrium: Left atrial size was normal in size. Right Atrium: Right atrial size was normal in size. Pericardium: There is no evidence of pericardial effusion. Mitral Valve: The mitral valve is normal in structure. Mild to moderate mitral valve regurgitation. No evidence of mitral valve stenosis. Tricuspid Valve: The tricuspid valve is normal in structure. Tricuspid valve regurgitation is not demonstrated. No evidence of tricuspid stenosis. Aortic Valve: The aortic valve is normal in structure. Aortic valve regurgitation is not visualized. No aortic stenosis is present. Pulmonic Valve: The pulmonic valve was normal in structure. Pulmonic valve regurgitation is not visualized. No evidence of pulmonic stenosis. Aorta: The aortic root is normal in size and structure. Venous: The inferior vena cava is normal in size with greater than 50% respiratory variability, suggesting right atrial pressure of 3 mmHg. IAS/Shunts: No atrial level  shunt detected by color flow Doppler. Additional Comments: 3D was performed not requiring image post processing on an independent workstation and was indeterminate.  LEFT VENTRICLE PLAX 2D LVIDd:         4.90 cm   Diastology LVIDs:         3.10 cm   LV e' medial:    9.36 cm/s LV PW:         1.00 cm   LV E/e' medial:  9.1 LV IVS:        1.00 cm   LV e' lateral:   9.32 cm/s LVOT diam:     2.00 cm   LV E/e' lateral: 9.2 LV SV:         56 LV SV Index:   30 LVOT Area:     3.14 cm  RIGHT VENTRICLE RV Basal diam:  3.30 cm RV S prime:     18.33 cm/s TAPSE (M-mode): 3.2 cm LEFT ATRIUM             Index        RIGHT ATRIUM           Index LA diam:        3.90 cm 2.08 cm/m   RA Area:     11.60 cm LA Vol (A2C):   50.9 ml 27.21 ml/m  RA  Volume:   27.60 ml  14.75 ml/m LA Vol (A4C):   38.2 ml 20.42 ml/m LA Biplane Vol: 44.4 ml 23.73 ml/m  AORTIC VALVE LVOT Vmax:   98.00 cm/s LVOT Vmean:  64.000 cm/s LVOT VTI:    0.179 m  AORTA Ao Root diam: 3.30 cm MITRAL VALVE                TRICUSPID VALVE MV Area (PHT): 3.65 cm     TR Peak grad:   16.3 mmHg MV Decel Time: 208 msec     TR Vmax:        202.00 cm/s MR Peak grad: 82.4 mmHg MR Vmax:      454.00 cm/s   SHUNTS MV E velocity: 85.55 cm/s   Systemic VTI:  0.18 m MV A velocity: 123.50 cm/s  Systemic Diam: 2.00 cm MV E/A ratio:  0.69 Julien Nordmann MD Electronically signed by Julien Nordmann MD Signature Date/Time: 11/12/2023/1:59:01 PM    Final    MR ABDOMEN MRCP W WO CONTAST Result Date: 11/12/2023 CLINICAL DATA:  Evaluate liver lesions.  Chest mass. EXAM: MRI ABDOMEN WITHOUT AND WITH CONTRAST (INCLUDING MRCP) TECHNIQUE: Multiplanar multisequence MR imaging of the abdomen was performed both before and after the administration of intravenous contrast. Heavily T2-weighted images of the biliary and pancreatic ducts were obtained, and three-dimensional MRCP images were rendered by post processing. CONTRAST:  8mL GADAVIST GADOBUTROL 1 MMOL/ML IV SOLN COMPARISON:  CT AP 11/10/2023  FINDINGS: Lower chest: Small right pleural effusion. Partially visualized mass within the superior segment of right lower lobe noted. Hepatobiliary: There are multifocal bilobar, ill-defined areas of mild increased T2 signal noted within both lobes of liver which show hypoenhancement on the postcontrast images and restricted diffusion. At least 5 lesions are noted involving both lobes. The largest lesion is in segment 5 measuring 1.1 cm, image 17/8. Lesion in segment 2/3 measures 6 mm, image 10/8. Status post cholecystectomy. Mild intrahepatic bile duct dilatation. Fusiform dilatation of the common bile duct which measures 1.3 cm proximally. No signs of choledocholithiasis or mass. Pancreas:  No main duct dilatation, inflammation or mass identified. Spleen: Within the proximal portion of the spleen there are 2 wedge-shaped areas of peripheral hypoenhancement, image 25/31. Adrenals/Urinary Tract: There is a 1.3 cm nodule in the left adrenal gland which is indeterminate with only mild loss of signal on the out of phase sequences, image 29/9. This is a new finding when compared with the remote CT of the abdomen pelvis from 09/17/2020. Normal right adrenal gland. No kidney mass or signs of obstructive uropathy. Stomach/Bowel: Visualized portions within the abdomen are unremarkable. Vascular/Lymphatic: No aneurysm. Aortic atherosclerosis. No adenopathy. Other:  No ascites. Musculoskeletal: Multifocal abnormal areas of enhancement are identified throughout the visualized portions of the thoracic and lumbar spine which are concerning for osseous metastasis. IMPRESSION: 1. There are multifocal bilobar, ill-defined areas of mild increased T2 signal noted within both lobes of liver which show hypoenhancement on the postcontrast images and restricted diffusion. At least 5 lesions are noted involving both lobes. 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 2. Multifocal abnormal areas of enhancement are identified throughout  the visualized portions of the thoracic and lumbar spine which are concerning for osseous metastasis. 3. There are 2 wedge-shaped areas of peripheral hypoenhancement within the proximal portion of the spleen which are concerning for splenic infarcts. 4. There is a 1.3 cm nodule in the left adrenal gland which is indeterminate with only mild loss of signal on the out  of phase sequences. This is a new finding when compared with the remote CT of the abdomen pelvis from 09/17/2020. Differential considerations include lipid poor adenoma versus a adrenal metastasis. 5. Small right pleural effusion. 6. Partially visualized mass within the superior segment of right lower lobe. 7. Status post cholecystectomy. Mild intrahepatic bile duct dilatation. Fusiform dilatation of the common bile duct which measures 1.3 cm proximally. No signs of choledocholithiasis or mass. Electronically Signed   By: Signa Kell M.D.   On: 11/12/2023 05:07   MR 3D Recon At Scanner Result Date: 11/12/2023 CLINICAL DATA:  Evaluate liver lesions.  Chest mass. EXAM: MRI ABDOMEN WITHOUT AND WITH CONTRAST (INCLUDING MRCP) TECHNIQUE: Multiplanar multisequence MR imaging of the abdomen was performed both before and after the administration of intravenous contrast. Heavily T2-weighted images of the biliary and pancreatic ducts were obtained, and three-dimensional MRCP images were rendered by post processing. CONTRAST:  8mL GADAVIST GADOBUTROL 1 MMOL/ML IV SOLN COMPARISON:  CT AP 11/10/2023 FINDINGS: Lower chest: Small right pleural effusion. Partially visualized mass within the superior segment of right lower lobe noted. Hepatobiliary: There are multifocal bilobar, ill-defined areas of mild increased T2 signal noted within both lobes of liver which show hypoenhancement on the postcontrast images and restricted diffusion. At least 5 lesions are noted involving both lobes. The largest lesion is in segment 5 measuring 1.1 cm, image 17/8. Lesion in segment  2/3 measures 6 mm, image 10/8. Status post cholecystectomy. Mild intrahepatic bile duct dilatation. Fusiform dilatation of the common bile duct which measures 1.3 cm proximally. No signs of choledocholithiasis or mass. Pancreas:  No main duct dilatation, inflammation or mass identified. Spleen: Within the proximal portion of the spleen there are 2 wedge-shaped areas of peripheral hypoenhancement, image 25/31. Adrenals/Urinary Tract: There is a 1.3 cm nodule in the left adrenal gland which is indeterminate with only mild loss of signal on the out of phase sequences, image 29/9. This is a new finding when compared with the remote CT of the abdomen pelvis from 09/17/2020. Normal right adrenal gland. No kidney mass or signs of obstructive uropathy. Stomach/Bowel: Visualized portions within the abdomen are unremarkable. Vascular/Lymphatic: No aneurysm. Aortic atherosclerosis. No adenopathy. Other:  No ascites. Musculoskeletal: Multifocal abnormal areas of enhancement are identified throughout the visualized portions of the thoracic and lumbar spine which are concerning for osseous metastasis. IMPRESSION: 1. There are multifocal bilobar, ill-defined areas of mild increased T2 signal noted within both lobes of liver which show hypoenhancement on the postcontrast images and restricted diffusion. At least 5 lesions are noted involving both lobes. 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 2. Multifocal abnormal areas of enhancement are identified throughout the visualized portions of the thoracic and lumbar spine which are concerning for osseous metastasis. 3. There are 2 wedge-shaped areas of peripheral hypoenhancement within the proximal portion of the spleen which are concerning for splenic infarcts. 4. There is a 1.3 cm nodule in the left adrenal gland which is indeterminate with only mild loss of signal on the out of phase sequences. This is a new finding when compared with the remote CT of the abdomen pelvis from  09/17/2020. Differential considerations include lipid poor adenoma versus a adrenal metastasis. 5. Small right pleural effusion. 6. Partially visualized mass within the superior segment of right lower lobe. 7. Status post cholecystectomy. Mild intrahepatic bile duct dilatation. Fusiform dilatation of the common bile duct which measures 1.3 cm proximally. No signs of choledocholithiasis or mass. Electronically Signed   By: Signa Kell  M.D.   On: 11/12/2023 05:07   CT ANGIO HEAD NECK W WO CM Addendum Date: 11/11/2023 ADDENDUM REPORT: 11/11/2023 23:37 ADDENDUM: Findings discussed with Mariah Milling, NP via telephone at 10:33 p.m. Electronically Signed   By: Feliberto Harts M.D.   On: 11/11/2023 23:37   Result Date: 11/11/2023 CLINICAL DATA:  Stroke/TIA, determine embolic source EXAM: CT ANGIOGRAPHY HEAD AND NECK WITH AND WITHOUT CONTRAST TECHNIQUE: Multidetector CT imaging of the head and neck was performed using the standard protocol during bolus administration of intravenous contrast. Multiplanar CT image reconstructions and MIPs were obtained to evaluate the vascular anatomy. Carotid stenosis measurements (when applicable) are obtained utilizing NASCET criteria, using the distal internal carotid diameter as the denominator. RADIATION DOSE REDUCTION: This exam was performed according to the departmental dose-optimization program which includes automated exposure control, adjustment of the mA and/or kV according to patient size and/or use of iterative reconstruction technique. CONTRAST:  75mL OMNIPAQUE IOHEXOL 350 MG/ML SOLN COMPARISON:  Same day MRI head. FINDINGS: CTA NECK FINDINGS Aortic arch: Aortic atherosclerosis. 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 Great vessel origins are patent. Right carotid system: Atherosclerosis at the carotid bifurcation without greater than 50% stenosis. Left carotid system: Atherosclerosis at the carotid bifurcation without greater  than 50% stenosis. Vertebral arteries: Codominant. No evidence of dissection, stenosis (50% or greater), or occlusion. Skeleton: No evidence of acute abnormality on limited assessment. Multilevel degenerative change. Other neck: Approximately 2.3 cm left thyroid nodule. Upper chest: Please see same day CT chest for intrathoracic findings. Emphysema. Review of the MIP images confirms the above findings CTA HEAD FINDINGS Anterior circulation: Bilateral intracranial ICAs are patent. Approximately 2 mm medially directed outpouching arising from the right cavernous ICA, which could represent an infundibulum or aneurysm. Bilateral M1 MCAs are patent. 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 Bilateral ACAs are patent without proximal hemodynamically significant stenosis. Posterior circulation: Bilateral intradural vertebral arteries, basilar artery and bilateral posterior cerebral arteries are patent without proximal hemodynamically significant stenosis. Venous sinuses: As permitted by contrast timing, patent. Review of the MIP images confirms the above findings The ordering provider has been paged at the time of dictation for call of report. IMPRESSION: 1. 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 2. Approximately 2 mm medially directed outpouching arising from the right cavernous ICA, which could represent an infundibulum or aneurysm. 3. Please see same day CT chest for intrathoracic findings. 4. Approximately 2.3 cm left thyroid nodule. Recommend thyroid US (ref: J Am Coll Radiol. 2015 Feb;12(2): 143-50). 5. Aortic Atherosclerosis (ICD10-I70.0) and Emphysema (ICD10-J43.9). Electronically Signed: By: Feliberto Harts M.D. On: 11/11/2023 23:14   MR BRAIN W WO CONTRAST Result Date: 11/11/2023 CLINICAL DATA:  right facial weakness, malignancy also on the ddx EXAM: MRI HEAD WITHOUT AND WITH CONTRAST TECHNIQUE: Multiplanar, multiecho pulse sequences of the brain and surrounding  structures were obtained without and with intravenous contrast. CONTRAST:  8mL GADAVIST GADOBUTROL 1 MMOL/ML IV SOLN COMPARISON:  CT head November 11, 2023. 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 Mild edema without mass effect. No midline shift. Probable additional punctate acute infarct in the inferior left cerebellum. No evidence of acute hemorrhage, mass lesion, or hydrocephalus. Vascular: Major arterial flow voids are maintained at the skull base. Skull and upper cervical spine: Normal marrow signal. Sinuses/Orbits: Clear sinuses.  No acute orbital findings. IMPRESSION: 1. Many small acute infarcts in the posterior  left MCA distribution including the left frontal and parietal lobes 2. Probable additional punctate acute infarct in the inferior left cerebellum. These results will be called to the ordering clinician or representative by the Radiologist Assistant, and communication documented in the PACS or [employer]. Electronically Signed   By: Feliberto Harts M.D.   On: 11/11/2023 20:27   CT CHEST W CONTRAST Result Date: 11/11/2023 CLINICAL DATA:  Liver lesions with history of breast cancer. EXAM: CT CHEST WITH CONTRAST TECHNIQUE: Multidetector CT imaging of the chest was performed during intravenous contrast administration. RADIATION DOSE REDUCTION: This exam was performed according to the departmental dose-optimization program which includes automated exposure control, adjustment of the mA and/or kV according to patient size and/or use of iterative reconstruction technique. CONTRAST:  75mL OMNIPAQUE IOHEXOL 300 MG/ML  SOLN COMPARISON:  CT abdomen and pelvis 11/10/2023. FINDINGS: Cardiovascular: Heart is borderline enlarged/mildly enlarged. Aorta is normal in size. There is no pericardial effusion. There are atherosclerotic calcifications of the aorta. Mediastinum/Nodes: There is a heterogeneous left thyroid nodule  attaining cystic and solid components measuring 2.6 x 1.5 cm. There are multiple enlarged right paratracheal lymph nodes measuring up to 2.0 x 2.9 cm. Enlarged subcarinal lymph node measures 1.2 cm short axis. There is a prominent right lower esophageal lymph node measuring 8 mm short axis. There are multiple enlarged right hilar lymph nodes measuring up to 1.4 x 1.8 cm. Visualized esophagus is within normal limits. Lungs/Pleura: Mild emphysematous changes are present. There is scarring in both lung apices. Multifocal airspace disease is seen throughout the right lower lobe, predominantly posteriorly and peripherally. Some areas are slightly nodular for example superior segment measuring 3.2 by 2.0 cm. A small air-fluid level seen in this region. No other air-fluid levels are seen. Additional nodular density seen in the right lower lobe image 5/92 measuring 9 mm. Smaller nodules are seen scattered throughout the right lower lobe measuring 5 mm or less. There is a trace right pleural effusion. There is a right middle lobe nodule measuring 5 mm image 5/122. There are prominent intrapulmonary lymph nodes along the right major fissure. There are 2 mm nodular densities in the left upper lobe. Left lung is otherwise clear. Upper Abdomen: Subcentimeter hypodensities in the liver and left adrenal mass are unchanged. Please see CT abdomen and pelvis performed same day for further description. Musculoskeletal: There is anterior intramuscular chest wall edema on the right. Focal density is seen in the medial right breast extending to the skin surface measuring 1.2 x 2.3 cm image 3/81. There some associated diffuse right breast skin thickening. No acute fracture. There is a single 5 mm sclerotic density in the T3 vertebral body. IMPRESSION: 1. Multifocal airspace disease throughout the right lower lobe, predominantly posteriorly and peripherally. Single area is slightly nodular in the superior segment containing an air-fluid  level worrisome for necrotic pneumonia or necrotic nodule. Findings may be infectious/inflammatory, but neoplasm not excluded. 2. Trace right pleural effusion. 3. Mediastinal and right hilar lymphadenopathy. 4. Additional right lower lobe and right middle lobe pulmonary nodules measuring 5 mm. 5. Left thyroid nodule measuring 2.6 cm. Recommend further evaluation with ultrasound. 6. Focal density in the medial right breast extending to the skin surface measuring 1.2 x 2.3 cm. Correlate with physical exam. 7. Single 5 mm sclerotic density in the T3 vertebral body. Aortic Atherosclerosis (ICD10-I70.0) and Emphysema (ICD10-J43.9). Electronically Signed   By: Darliss Cheney M.D.   On: 11/11/2023 17:53   CT HEAD WO CONTRAST ( ) Result  Date: 11/11/2023 CLINICAL DATA:  Right facial weakness. EXAM: CT HEAD WITHOUT CONTRAST TECHNIQUE: Contiguous axial images were obtained from the base of the skull through the vertex without intravenous contrast. RADIATION DOSE REDUCTION: This exam was performed according to the departmental dose-optimization program which includes automated exposure control, adjustment of the mA and/or kV according to patient size and/or use of iterative reconstruction technique. COMPARISON:  Head CT 07/21/2022 FINDINGS: Brain: There are new small hypodensities in the left frontoparietal white matter at the level of the posterior centrum semiovale (series 3, image 20), and there is a new subcentimeter focus of cortical hypodensity at the level of the left parietal operculum (series 6, image 46), suspicious for recent infarcts. No intracranial hemorrhage, mass, midline shift, or extra-axial fluid collection is identified. Cerebral volume is normal. The ventricles are normal in size. Vascular: No hyperdense vessel. Skull: No acute fracture or suspicious lesion. Sinuses/Orbits: Visualized paranasal sinuses and mastoid air cells are clear. Bilateral cataract extraction. Other: None. IMPRESSION: Suspected  recent small infarcts involving left parietal cortex and white matter. A brain MRI is in progress and will provide further evaluation. Electronically Signed   By: Sebastian Ache M.D.   On: 11/11/2023 15:13   DG Chest 2 View Result Date: 11/10/2023 CLINICAL DATA:  dyspnea EXAM: CHEST - 2 VIEW COMPARISON:  Chest x-ray 07/21/2022, chest x-ray 02/15/2021 FINDINGS: The heart and mediastinal contours are unchanged. Prominent hilar vasculature. No focal consolidation. No pulmonary edema. No pleural effusion. No pneumothorax. No acute osseous abnormality. IMPRESSION: Prominent hilar vasculature suggestive of pulmonary venous congestion. Underlying lymph nodes not excluded. Electronically Signed   By: Tish Frederickson M.D.   On: 11/10/2023 23:50   CT ABDOMEN PELVIS W CONTRAST Result Date: 11/10/2023 CLINICAL DATA:  Acute nonlocalized abdominal pain. Vomiting and diarrhea. EXAM: CT ABDOMEN AND PELVIS WITH CONTRAST TECHNIQUE: Multidetector CT imaging of the abdomen and pelvis was performed using the standard protocol following bolus administration of intravenous contrast. RADIATION DOSE REDUCTION: This exam was performed according to the departmental dose-optimization program which includes automated exposure control, adjustment of the mA and/or kV according to patient size and/or use of iterative reconstruction technique. CONTRAST:  OMNIPAQUE IOHEXOL 300 MG/ML  SOLN COMPARISON:  None Available. FINDINGS: Lower chest: Focal consolidation suggested in the right lung base with small right pleural effusion. This may represent pneumonia or less likely aspiration. Emphysematous changes in the lungs. Hepatobiliary: Multiple scattered low-attenuation lesions in the liver, most are subcentimeter in size. Largest is in the medial segment left lobe measuring 1.3 cm in diameter. Enhancement pattern is indeterminate. Consider follow-up MRI for further characterization. The gallbladder is not visualized, likely surgically absent.  Mild intrahepatic bile duct dilatation is most likely due to postoperative change. No common duct stones are identified. Pancreas: Unremarkable. No pancreatic ductal dilatation or surrounding inflammatory changes. Spleen: Segmental low-attenuation in the upper spleen likely represents a splenic infarct. Spleen size is normal. Adrenals/Urinary Tract: 1.3 cm diameter left adrenal gland nodule with density measurement of 69 Hounsfield units. Renal nephrograms are symmetrical. No solid mass identified. No hydronephrosis or hydroureter. Bladder is normal. Stomach/Bowel: Stomach is within normal limits. Appendix appears normal. No evidence of bowel wall thickening, distention, or inflammatory changes. Vascular/Lymphatic: Aortic atherosclerosis. No enlarged abdominal or pelvic lymph nodes. Reproductive: Uterus and bilateral adnexa are unremarkable. Other: No abdominal wall hernia or abnormality. No abdominopelvic ascites. Musculoskeletal: Postoperative fixation of the right hip. No acute bony abnormalities. IMPRESSION: 1. Nonspecific low-attenuation lesions in the liver, largest measuring 1.3 cm  diameter. Enhancement pattern is indeterminate. Consider follow-up MRI for further characterization. 2. Mild intrahepatic bile duct dilatation is likely postoperative in the setting of cholecystectomy. 3. Left adrenal mass measuring 1.3 cm, probable benign adenoma. Recommend follow-up adrenal washout CT in 1 year. If stable for = 1 year, no further follow-up imaging. JACR 2017 Aug; 14(8):1038-44, JCAT 2016 Mar-Apr; 40(2):194-200, Urol J 2006 Spring; 3(2):71-4. 4. Aortic atherosclerosis. 5. Focal area of consolidation suggested in the right lung base with small right pleural effusion, likely pneumonia. Electronically Signed   By: Burman Nieves M.D.   On: 11/10/2023 23:30      Subjective: Seen and examined on the day of discharge.  Stable no distress.  Appropriate for discharge home.  Discharge Exam: Vitals:   11/20/23  0748 11/20/23 1333  BP: 125/64 (!) 110/55  Pulse: 79 73  Resp:    Temp: 98.5 F (36.9 C) 98.3 F (36.8 C)  SpO2: 100% 99%   Vitals:   11/19/23 2354 11/20/23 0455 11/20/23 0748 11/20/23 1333  BP: 100/88 114/62 125/64 (!) 110/55  Pulse: 86 67 79 73  Resp: 20 20    Temp: 98.2 F (36.8 C) 97.9 F (36.6 C) 98.5 F (36.9 C) 98.3 F (36.8 C)  TempSrc:      SpO2: 100% 98% 100% 99%  Weight:      Height:        General: Pt is alert, awake, not in acute distress Cardiovascular: RRR, S1/S2 +, no rubs, no gallops Respiratory: CTA bilaterally, no wheezing, no rhonchi Abdominal: Soft, NT, ND, bowel sounds + Extremities: no edema, no cyanosis    The results of significant diagnostics from this hospitalization (including imaging, microbiology, ancillary and laboratory) are listed below for reference.     Microbiology: Recent Results (from the past 240 hours)  Resp panel by RT-PCR (RSV, Flu A&B, Covid) Anterior Nasal Swab     Status: None   Collection Time: 11/10/23 11:05 PM   Specimen: Anterior Nasal Swab  Result Value Ref Range Status   SARS Coronavirus 2 by RT PCR NEGATIVE NEGATIVE Final    Comment: (NOTE) SARS-CoV-2 target nucleic acids are NOT DETECTED.  The SARS-CoV-2 RNA is generally detectable in upper respiratory specimens during the acute phase of infection. The lowest concentration of SARS-CoV-2 viral copies this assay can detect is 138 copies/mL. A negative result does not preclude SARS-Cov-2 infection and should not be used as the sole basis for treatment or other patient management decisions. A negative result may occur with  improper specimen collection/handling, submission of specimen other than nasopharyngeal swab, presence of viral mutation(s) within the areas targeted by this assay, and inadequate number of viral copies(<138 copies/mL). A negative result must be combined with clinical observations, patient history, and epidemiological information. The  expected result is Negative.  Fact Sheet for Patients:  BloggerCourse.com  Fact Sheet for Healthcare Providers:  SeriousBroker.it  This test is no t yet approved or cleared by the [place] FDA and  has been authorized for detection and/or diagnosis of SARS-CoV-2 by FDA under an Emergency Use Authorization (EUA). This EUA will remain  in effect (meaning this test can be used) for the duration of the COVID-19 declaration under Section 564(b)(1) of the Act, 21 U.S.C.section 360bbb-3(b)(1), unless the authorization is terminated  or revoked sooner.       Influenza A by PCR NEGATIVE NEGATIVE Final   Influenza B by PCR NEGATIVE NEGATIVE Final    Comment: (NOTE) The Xpert Xpress SARS-CoV-2/FLU/RSV plus assay is  intended as an aid in the diagnosis of influenza from Nasopharyngeal swab specimens and should not be used as a sole basis for treatment. Nasal washings and aspirates are unacceptable for Xpert Xpress SARS-CoV-2/FLU/RSV testing.  Fact Sheet for Patients: BloggerCourse.com  Fact Sheet for Healthcare Providers: SeriousBroker.it  This test is not yet approved or cleared by the [place] FDA and has been authorized for detection and/or diagnosis of SARS-CoV-2 by FDA under an Emergency Use Authorization (EUA). This EUA will remain in effect (meaning this test can be used) for the duration of the COVID-19 declaration under Section 564(b)(1) of the Act, 21 U.S.C. section 360bbb-3(b)(1), unless the authorization is terminated or revoked.     Resp Syncytial Virus by PCR NEGATIVE NEGATIVE Final    Comment: (NOTE) Fact Sheet for Patients: BloggerCourse.com  Fact Sheet for Healthcare Providers: SeriousBroker.it  This test is not yet approved or cleared by the [place] FDA and has been authorized for detection and/or  diagnosis of SARS-CoV-2 by FDA under an Emergency Use Authorization (EUA). This EUA will remain in effect (meaning this test can be used) for the duration of the COVID-19 declaration under Section 564(b)(1) of the Act, 21 U.S.C. section 360bbb-3(b)(1), unless the authorization is terminated or revoked.  Performed at [facility], [address] [place], [place] [address]   Blood culture (routine x 2)     Status: None   Collection Time: 11/11/23 12:10 AM   Specimen: BLOOD  Result Value Ref Range Status   Specimen Description BLOOD LEFT ANTECUBITAL  Final   Special Requests   Final    BOTTLES DRAWN AEROBIC AND ANAEROBIC Blood Culture adequate volume   Culture   Final    NO GROWTH 5 DAYS Performed at [facility], [address] [place], [place] [address]    Report Status 11/16/2023 FINAL  Final  Blood culture (routine x 2)     Status: None   Collection Time: 11/11/23 12:39 AM   Specimen: BLOOD  Result Value Ref Range Status   Specimen Description BLOOD BLOOD RIGHT HAND  Final   Special Requests   Final    BOTTLES DRAWN AEROBIC AND ANAEROBIC Blood Culture adequate volume   Culture   Final    NO GROWTH 5 DAYS Performed at [facility], [address] [place], [place] [address]    Report Status 11/16/2023 FINAL  Final  MRSA Next Gen by PCR, Nasal     Status: None   Collection Time: 11/12/23  1:40 AM   Specimen: Nasal Mucosa; Nasal Swab  Result Value Ref Range Status   MRSA by PCR Next Gen NOT DETECTED NOT DETECTED Final    Comment: (NOTE) The GeneXpert MRSA Assay (FDA approved for NASAL specimens only), is one component of a comprehensive MRSA colonization surveillance program. It is not intended to diagnose MRSA infection nor to guide or monitor treatment for MRSA infections. Test performance is not FDA approved in patients less than 2 years old. Performed at [facility], [address] [place], [place] [address]       Labs: BNP (last 3 results) Recent Labs    11/11/23 0510  BNP 77.5   Basic Metabolic Panel: Recent Labs  Lab 11/17/23 0910 11/20/23 0838  NA 139 140  K 3.6 3.7  CL 102 101  CO2 29 31  GLUCOSE 122* 109*  BUN 15 13  CREATININE 0.90 0.92  CALCIUM 9.0 9.2   Liver Function Tests: Recent Labs  Lab 11/17/23  0910  AST 42*  ALT 67*  ALKPHOS 49  BILITOT 0.7  PROT 6.9  ALBUMIN 3.2*   Recent Labs  Lab 11/17/23 0910  LIPASE 49   No results for input(s): "AMMONIA" in the last 168 hours. CBC: Recent Labs  Lab 11/17/23 0910 11/18/23 0250 11/19/23 0413 11/20/23 0632 11/20/23 0838  WBC 11.7* 10.0 9.3 8.6 9.0  NEUTROABS 9.5*  --   --   --  6.1  HGB 9.2* 8.1* 7.8* 8.0* 8.1*  HCT 27.7* 24.0* 24.0* 24.0* 23.8*  MCV 92.3 90.6 93.0 92.3 91.2  PLT 204 183 184 199 184   Cardiac Enzymes: No results for input(s): "CKTOTAL", "CKMB", "CKMBINDEX", "TROPONINI" in the last 168 hours. BNP: Invalid input(s): "POCBNP" CBG: No results for input(s): "GLUCAP" in the last 168 hours. D-Dimer No results for input(s): "DDIMER" in the last 72 hours. Hgb A1c No results for input(s): "HGBA1C" in the last 72 hours. Lipid Profile No results for input(s): "CHOL", "HDL", "LDLCALC", "TRIG", "CHOLHDL", "LDLDIRECT" in the last 72 hours. Thyroid function studies No results for input(s): "TSH", "T4TOTAL", "T3FREE", "THYROIDAB" in the last 72 hours.  Invalid input(s): "FREET3" Anemia work up Recent Labs    11/19/23 0832 11/20/23 0632  VITAMINB12 7,289*  --   RETICCTPCT  --  5.4*   Urinalysis    Component Value Date/Time   COLORURINE YELLOW (A) 11/11/2023 0137   APPEARANCEUR CLEAR (A) 11/11/2023 0137   APPEARANCEUR Clear 09/26/2020 1501   LABSPEC >1.046 (H) 11/11/2023 0137   LABSPEC 1.023 11/30/2014 1052   PHURINE 5.0 11/11/2023 0137   GLUCOSEU NEGATIVE 11/11/2023 0137   GLUCOSEU Negative 11/30/2014 1052   HGBUR SMALL (A) 11/11/2023 0137   BILIRUBINUR NEGATIVE 11/11/2023 0137    BILIRUBINUR Negative 09/26/2020 1501   BILIRUBINUR Negative 11/30/2014 1052   KETONESUR 20 (A) 11/11/2023 0137   PROTEINUR NEGATIVE 11/11/2023 0137   NITRITE NEGATIVE 11/11/2023 0137   LEUKOCYTESUR NEGATIVE 11/11/2023 0137   LEUKOCYTESUR Negative 11/30/2014 1052   Sepsis Labs Recent Labs  Lab 11/18/23 0250 11/19/23 0413 11/20/23 0632 11/20/23 0838  WBC 10.0 9.3 8.6 9.0   Microbiology Recent Results (from the past 240 hours)  Resp panel by RT-PCR (RSV, Flu A&B, Covid) Anterior Nasal Swab     Status: None   Collection Time: 11/10/23 11:05 PM   Specimen: Anterior Nasal Swab  Result Value Ref Range Status   SARS Coronavirus 2 by RT PCR NEGATIVE NEGATIVE Final    Comment: (NOTE) SARS-CoV-2 target nucleic acids are NOT DETECTED.  The SARS-CoV-2 RNA is generally detectable in upper respiratory specimens during the acute phase of infection. The lowest concentration of SARS-CoV-2 viral copies this assay can detect is 138 copies/mL. A negative result does not preclude SARS-Cov-2 infection and should not be used as the sole basis for treatment or other patient management decisions. A negative result may occur with  improper specimen collection/handling, submission of specimen other than nasopharyngeal swab, presence of viral mutation(s) within the areas targeted by this assay, and inadequate number of viral copies(<138 copies/mL). A negative result must be combined with clinical observations, patient history, and epidemiological information. The expected result is Negative.  Fact Sheet for Patients:  BloggerCourse.com  Fact Sheet for Healthcare Providers:  SeriousBroker.it  This test is no t yet approved or cleared by the [place] FDA and  has been authorized for detection and/or diagnosis of SARS-CoV-2 by FDA under an Emergency Use Authorization (EUA). This EUA will remain  in effect (meaning this test can be  used) for the  duration of the COVID-19 declaration under Section 564(b)(1) of the Act, 21 U.S.C.section 360bbb-3(b)(1), unless the authorization is terminated  or revoked sooner.       Influenza A by PCR NEGATIVE NEGATIVE Final   Influenza B by PCR NEGATIVE NEGATIVE Final    Comment: (NOTE) The Xpert Xpress SARS-CoV-2/FLU/RSV plus assay is intended as an aid in the diagnosis of influenza from Nasopharyngeal swab specimens and should not be used as a sole basis for treatment. Nasal washings and aspirates are unacceptable for Xpert Xpress SARS-CoV-2/FLU/RSV testing.  Fact Sheet for Patients: BloggerCourse.com  Fact Sheet for Healthcare Providers: SeriousBroker.it  This test is not yet approved or cleared by the [place] FDA and has been authorized for detection and/or diagnosis of SARS-CoV-2 by FDA under an Emergency Use Authorization (EUA). This EUA will remain in effect (meaning this test can be used) for the duration of the COVID-19 declaration under Section 564(b)(1) of the Act, 21 U.S.C. section 360bbb-3(b)(1), unless the authorization is terminated or revoked.     Resp Syncytial Virus by PCR NEGATIVE NEGATIVE Final    Comment: (NOTE) Fact Sheet for Patients: BloggerCourse.com  Fact Sheet for Healthcare Providers: SeriousBroker.it  This test is not yet approved or cleared by the [place] FDA and has been authorized for detection and/or diagnosis of SARS-CoV-2 by FDA under an Emergency Use Authorization (EUA). This EUA will remain in effect (meaning this test can be used) for the duration of the COVID-19 declaration under Section 564(b)(1) of the Act, 21 U.S.C. section 360bbb-3(b)(1), unless the authorization is terminated or revoked.  Performed at [facility], [address] [place], [place] [address]   Blood culture (routine x 2)     Status: None    Collection Time: 11/11/23 12:10 AM   Specimen: BLOOD  Result Value Ref Range Status   Specimen Description BLOOD LEFT ANTECUBITAL  Final   Special Requests   Final    BOTTLES DRAWN AEROBIC AND ANAEROBIC Blood Culture adequate volume   Culture   Final    NO GROWTH 5 DAYS Performed at [facility], [address], [place], [place] [address]    Report Status 11/16/2023 FINAL  Final  Blood culture (routine x 2)     Status: None   Collection Time: 11/11/23 12:39 AM   Specimen: BLOOD  Result Value Ref Range Status   Specimen Description BLOOD BLOOD RIGHT HAND  Final   Special Requests   Final    BOTTLES DRAWN AEROBIC AND ANAEROBIC Blood Culture adequate volume   Culture   Final    NO GROWTH 5 DAYS Performed at [facility], [address], [place], [place] [address]    Report Status 11/16/2023 FINAL  Final  MRSA Next Gen by PCR, Nasal     Status: None   Collection Time: 11/12/23  1:40 AM   Specimen: Nasal Mucosa; Nasal Swab  Result Value Ref Range Status   MRSA by PCR Next Gen NOT DETECTED NOT DETECTED Final    Comment: (NOTE) The GeneXpert MRSA Assay (FDA approved for NASAL specimens only), is one component of a comprehensive MRSA colonization surveillance program. It is not intended to diagnose MRSA infection nor to guide or monitor treatment for MRSA infections. Test performance is not FDA approved in patients less than 63 years old. Performed at [facility], [address], [place], [place] [address]      Time coordinating discharge: Over 30 minutes  SIGNED:  Tresa Moore, MD  [facility] 11/20/2023, 3:08 PM Pager   If 7PM-7AM, please contact night-coverage

## 2023-11-20 NOTE — Progress Notes (Addendum)
 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 Patient noted to have been recently admitted March 25 to March 31 for issues including intractable nausea and vomiting, acute embolic CVA, transaminitis with new liver lesions.   OT comments  Pt is supine in bed on arrival. Pleasant and agreeable to OT session. She denies pain. Pt performed bed mobility MOD I. LB dressing to don pants and socks at EOB with supervision. Pt required SBA for STS from EOB to RW and CGA/SBA for SPT to recliner. BUE ROM and strength WFL, able to open all containers and bottles without issue. Improved grip strength and FNF today. Pt left seated in recliner with all needs in place and will cont to require skilled acute OT services to maximize her safety and IND to return to PLOF.       If plan is discharge home, recommend the following:  A little help with walking and/or transfers;A little help with bathing/dressing/bathroom;Help with stairs or ramp for entrance;Assistance with cooking/housework;Assist for transportation   Equipment Recommendations  BSC/3in1    Recommendations for Other Services      Precautions / Restrictions Precautions Precautions: Fall Recall of Precautions/Restrictions: Intact Restrictions Weight Bearing Restrictions Per Provider Order: No       Mobility Bed Mobility Overal bed mobility: Needs Assistance Bed Mobility: Supine to Sit     Supine to sit: Modified independent (Device/Increase time)           Transfers Overall transfer level: Needs assistance Equipment used: Rolling walker (2 wheels) Transfers: Sit to/from Stand Sit to Stand: Supervision           General transfer comment: SUP for STS from EOB to RW and SUP for SPT to recliner     Balance Overall balance assessment: Needs assistance Sitting-balance support: Feet supported Sitting balance-Leahy Scale: Normal     Standing balance support: Bilateral upper extremity supported, Reliant on assistive device for balance Standing balance-Leahy Scale: Fair Standing balance comment: CGA/SBA with RW use                           ADL either performed or assessed with clinical judgement   ADL Overall ADL's : Needs assistance/impaired Eating/Feeding: Independent;Sitting Eating/Feeding Details (indicate cue type and reason): in recliner                 Lower Body Dressing: Supervision/safety Lower Body Dressing Details (indicate cue type and reason): to don pants and socks Toilet Transfer: Supervision/safety;Rolling walker (2 wheels) Toilet Transfer Details (indicate cue type and reason): simulated to recliner using RW                Extremity/Trunk Assessment Upper Extremity Assessment RUE Deficits / Details: ROM and strength WFL, able to open all containers and bottles without issue; improved grip strength and FNF today            Vision       Perception     Praxis     Communication Communication Communication: Impaired Factors Affecting  Communication: Difficulty expressing self (dysarthritic)   Cognition Arousal: Alert Behavior During Therapy: WFL for tasks assessed/performed                                          Cueing   Cueing Techniques: Verbal cues  Exercises      Shoulder Instructions       General Comments      Pertinent Vitals/ Pain       Pain Assessment Pain Assessment: No/denies pain  Home Living                                           Prior Functioning/Environment              Frequency  Min 3X/week        Progress Toward Goals  OT Goals(current goals can now be found in the care plan section)  Progress towards OT goals: Progressing toward goals  Acute Rehab OT Goals Patient Stated Goal: return home OT Goal Formulation: With patient Time For Goal Achievement: 12/02/23 Potential to Achieve Goals: Good  Plan      Co-evaluation                 AM-PAC OT "6 Clicks" Daily Activity     Outcome Measure   Help from another person eating meals?: None Help from another person taking care of personal grooming?: None Help from another person toileting, which includes using toliet, bedpan, or urinal?: A Little Help from another person bathing (including washing, rinsing, drying)?: A Little Help from another person to put on and taking off regular upper body clothing?: None Help from another person to put on and taking off regular lower body clothing?: A Little 6 Click Score: 21    End of Session Equipment Utilized During Treatment: Rolling walker (2 wheels)  OT Visit Diagnosis: Other abnormalities of gait and mobility (R26.89);Cognitive communication deficit (R41.841) Symptoms and signs involving cognitive functions: Cerebral infarction   Activity Tolerance Patient tolerated treatment well   Patient Left in chair;with call bell/phone within reach;with chair alarm set   Nurse Communication Mobility status        Time: 1308-6578 OT Time Calculation (min): 14 min  Charges: OT General Charges $OT Visit: 1 Visit OT Treatments $Self Care/Home Management : 8-22 mins  Debra [name], OTR/L  11/20/23, 12:56 PM   Debra [name] 11/20/2023, 12:56 PM

## 2023-11-20 NOTE — Telephone Encounter (Signed)
 Pharmacy Patient Advocate Encounter  Insurance verification completed.    The patient is insured through  [employer] . Patient has Medicare and is not eligible for a copay card, but may be able to apply for patient assistance or Medicare RX Payment Plan (Patient Must reach out to their plan, if eligible for payment plan), if available.    Ran test claim for Enoxaparin inj and the current 30 day co-pay is $4.90.   This test claim was processed through [facility]- copay amounts may vary at other pharmacies due to pharmacy/plan contracts, or as the patient moves through the different stages of their insurance plan.

## 2023-11-20 NOTE — Care Management Important Message (Signed)
 Important Message  Patient Details  Name: Debra [name] MRN: [id number] Date of Birth: 1956/06/23   Important Message Given:  Yes - Medicare IM     Marcell Anger 11/20/2023, 11:01 AM

## 2023-11-20 NOTE — Progress Notes (Deleted)
 Physician Discharge Summary  Debra [name] VWU:[id number] DOB: [birth year] DOA: 11/17/2023  PCP: Barbette Reichmann, MD  Admit date: 11/17/2023 Discharge date: 11/20/2023  Admitted From: Home Disposition:  Home  Recommendations for Outpatient Follow-up:  Follow up with PCP in 1-2 weeks Ambulatory referral to neurology Ambulatory referral for outpatient PT OT SLP  Home Health:No  Equipment/Devices:None  Discharge Condition:Stable CODE STATUS:Full  Diet recommendation: Heart healthy  Brief/Interim Summary:  68 y.o. 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  Patient noted to have been recently admitted March 25 to March 31 for issues including intractable nausea and vomiting, acute embolic CVA, transaminitis with new liver lesions.  Had fairly extensive workup at that time with MRI showing small acute infarcts posterior left MCA distribution with concern for hypercoagulable state versus malignancy versus cardiac emboli.  Neurology was formally evaluated.  Patient was placed on IV heparin drip until biopsy was completed in the setting of liver lesion.  Oncology did not feel that patient had Eliquis failure.  However thought that patient had a hard time absorbing medication secondary to recurrent nausea vomiting and diarrhea.  Was discharged on Eliquis.  Per report, patient had significant nausea and malaise at home.  No true chest pain.  No focal diaphoresis or sweating.  No belly pain or diarrhea.  Patient with noted predominate weakness slurred speech and right facial droop.  Has not had any missed dose of Eliquis.   Discharge Diagnoses:  Principal Problem:   Recurrent cerebrovascular accidents (CVAs) (HCC) Active Problems:   NSTEMI (non-ST  elevated myocardial infarction) (HCC)   History of CVA (cerebrovascular accident)   Paroxysmal atrial fibrillation (HCC)   Hyperlipidemia   COPD with chronic bronchitis (HCC)   History of breast cancer   Elevated troponin   Acute CVA (cerebrovascular accident) (HCC)   Nausea and vomiting  Elevated troponin Cardiology following.  Troponin elevation relatively mild.  Nonspecific.  Favored to reflect supply/demand mismatch in the setting of recurrent CVA.  No EKG changes or chest pain to suggest true NSTEMI. Plan: Completed heparin GTT for 48 hours.  Transition to subcutaneous Lovenox 1 mg/kg.  Medically ready for discharge.     History of CVA (cerebrovascular accident) New cerebellar CVA Admitted March 25 through March 21 for noted embolic CVA. New infarcts noted in cerebellum on repeat image Not considered a treatment failure for Eliquis as patient is having difficulty tolerating due to intractable nausea and vomiting Suspect poor absorption as a result Plan: Initial recommendation for [facility] rehab.  Patient and son has declined after lengthy discussion with them at bedside.  Discussed with PT and OT.  They recommend outpatient therapy services as this will provide a more intensive rehab environment.  Outpatient ambulatory referrals for PT, OT, SLP placed at time of discharge   Paroxysmal atrial fibrillation (HCC) Eliquis discontinued in favor of weight-based Lovenox.  Continue home diltiazem.     History of breast cancer Baseline history of breast cancer followed by Dr. Cathie Hoops Noted liver lesion with concern for metastatic etiology status post biopsy during recent admission.  Last biopsies with indeterminate diagnosis.  Oncology recommending repeat biopsy. Plan: Repeat biopsy completed 4/3.  Oncology follow-up for results.  Oncology to arrange outpatient follow-up in cancer center   COPD with chronic bronchitis (HCC) Stable from  a respiratory standpoint Continue home  inhalers   Hyperlipidemia Cont statin    Discharge Instructions  Discharge Instructions     Ambulatory referral to Occupational Therapy   Complete by: As directed    Ambulatory referral to Physical Therapy   Complete by: As directed    Ambulatory referral to Speech Therapy   Complete by: As directed    Diet - low sodium heart healthy   Complete by: As directed    Increase activity slowly   Complete by: As directed       Allergies as of 11/20/2023       Reactions   Cephalexin Hives   Strawberry Extract Hives        Medication List     STOP taking these medications    apixaban 5 MG Tabs tablet Commonly known as: ELIQUIS       TAKE these medications    acetaminophen 500 MG tablet Commonly known as: TYLENOL Take 1,500 mg by mouth 2 (two) times daily as needed for moderate pain.   alendronate 70 MG tablet Commonly known as: FOSAMAX Take 70 mg by mouth every Sunday.   atorvastatin 80 MG tablet Commonly known as: LIPITOR Take 1 tablet (80 mg total) by mouth daily.   citalopram 10 MG tablet Commonly known as: CELEXA Take 10 mg by mouth daily.   diltiazem 120 MG 24 hr capsule Commonly known as: CARDIZEM CD Take 1 capsule (120 mg total) by mouth daily.   enoxaparin 80 MG/0.8ML injection Commonly known as: LOVENOX Inject 0.8 mLs (80 mg total) into the skin every 12 (twelve) hours.   feeding supplement Liqd Take 237 mLs by mouth 3 (three) times daily between meals.   Melatonin 5 MG Caps Take 15 mg by mouth at bedtime.   ondansetron 8 MG tablet Commonly known as: Zofran Take 1 tablet (8 mg total) by mouth every 8 (eight) hours as needed for nausea (Nausea or vomiting). Start on the third day after chemotherapy.   oxyCODONE 5 MG immediate release tablet Commonly known as: Oxy IR/ROXICODONE Take 5 mg by mouth every 6 (six) hours as needed for moderate pain (pain score 4-6).   pantoprazole 40 MG tablet Commonly known as: PROTONIX Take 1 tablet (40 mg  total) by mouth daily.   pregabalin 150 MG capsule Commonly known as: LYRICA Take 1 capsule (150 mg total) by mouth 2 (two) times daily.   roflumilast 500 MCG Tabs tablet Commonly known as: DALIRESP Take 1 tablet by mouth daily.   tamoxifen 20 MG tablet Commonly known as: NOLVADEX Take 1 tablet (20 mg total) by mouth daily.   Trelegy Ellipta 100-62.5-25 MCG/INH Aepb Generic drug: Fluticasone-Umeclidin-Vilant Inhale 1 puff into the lungs daily.        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 Recent biopsy of a right supraclavicular lymph node but additional tissue is needed for diagnostic purposes. EXAM: ULTRASOUND-GUIDED CORE BIOPSY OF RIGHT SUPRACLAVICULAR LYMPH NODE MEDICATIONS: Moderate sedation ANESTHESIA/SEDATION: Moderate (conscious) sedation was employed during this procedure. A total of Versed 1 mg and Fentanyl 50 mcg was administered intravenously by the radiology nurse. Total intra-service moderate Sedation Time: 15 minutes. The patient's level of consciousness and vital signs were monitored continuously by radiology nursing throughout the procedure under my direct supervision. FLUOROSCOPY TIME:  None COMPLICATIONS: None immediate. PROCEDURE: Informed written  consent was obtained from the patient after a thorough discussion of the procedural risks, benefits and alternatives. All questions were addressed. A timeout was performed prior to the initiation of the procedure. Ultrasound was used to identify enlarged right supraclavicular lymph nodes. Right supraclavicular area was prepped with chlorhexidine and sterile field was created. Skin was anesthetized with 1% lidocaine. Small incision was made. Using ultrasound guidance, 17 gauge coaxial needle was directed into a  supraclavicular lymph node. Total of 5 core biopsies were obtained with an 18 gauge core device. Specimens placed in formalin. 17 gauge coaxial needle was removed. Bandage placed over the puncture site. FINDINGS: Multiple enlarged right supraclavicular lymph nodes. Core biopsy needle confirmed within the supraclavicular lymph node. No immediate bleeding or hematoma formation. IMPRESSION: Successful ultrasound-guided core biopsy of an enlarged right supraclavicular lymph node. Electronically Signed   By: Richarda Overlie M.D.   On: 11/19/2023 16:16   ECHOCARDIOGRAM LIMITED Result Date: 11/18/2023    ECHOCARDIOGRAM LIMITED REPORT   Patient Name:   Debra [name] Date of Exam: 11/17/2023 Medical Rec #:  [id number]    Height:       64.0 in Accession #:    [id number]   Weight:       179.9 lb Date of Birth:  03/19/1956     BSA:          1.870 m Patient Age:    59 years     BP:           114/88 mmHg Patient Gender: F            HR:           84 bpm. Exam Location:  [facility] Procedure: 2D Echo, Limited Echo, Limited Color Doppler and Cardiac Doppler            (Both Spectral and Color Flow Doppler were utilized during            procedure). Indications:     Elevated Troponin  History:         Patient has prior history of Echocardiogram examinations, most                  recent 11/11/2023. COPD, Arrythmias:Atrial Fibrillation; Risk                  Factors:Hypertension and Dyslipidemia. Obstructive sleep apnea.                  Fatigue. Breast Cancer.  Sonographer:     Daphine Deutscher RDCS Referring Phys:  [id number] Raymon Mutton DUNN Diagnosing Phys: Yvonne Kendall MD IMPRESSIONS  1. Left ventricular ejection fraction, by estimation, is 60 to 65%. The left ventricle has normal function. The left ventricle has no regional wall motion abnormalities. Left ventricular diastolic parameters were normal.  2. Right ventricular systolic function is normal. The right ventricular size is normal. Tricuspid regurgitation signal is inadequate for  assessing PA pressure.  3. The mitral valve is degenerative. At least moderate mitral valve regurgitation, which may be underestimated due to jet eccentricity. No evidence of mitral stenosis.  4. Aortic valve regurgitation is not visualized. No aortic stenosis is present.  5. The inferior vena cava is normal in size with greater than 50% respiratory variability, suggesting right atrial pressure of 3 mmHg. FINDINGS  Left Ventricle: Left ventricular ejection fraction, by estimation, is 60 to 65%. The left ventricle has normal function. The left ventricle has no regional wall motion abnormalities. The left ventricular internal  cavity size was normal in size. There is  no left ventricular hypertrophy. Left ventricular diastolic parameters were normal. Right Ventricle: The right ventricular size is normal. No increase in right ventricular wall thickness. Right ventricular systolic function is normal. Tricuspid regurgitation signal is inadequate for assessing PA pressure. Pericardium: There is no evidence of pericardial effusion. Mitral Valve: The mitral valve is degenerative in appearance. There is moderate thickening of the mitral valve leaflet(s). Moderate mitral valve regurgitation, with wall-impinging jet. No evidence of mitral valve stenosis. Tricuspid Valve: The tricuspid valve is normal in structure. Tricuspid valve regurgitation is trivial. Aortic Valve: Aortic valve regurgitation is not visualized. No aortic stenosis is present. Aortic valve mean gradient measures 6.0 mmHg. Aortic valve peak gradient measures 13.5 mmHg. Aorta: The aortic root is normal in size and structure. Venous: The inferior vena cava is normal in size with greater than 50% respiratory variability, suggesting right atrial pressure of 3 mmHg. LEFT VENTRICLE PLAX 2D LVIDd:         5.20 cm Diastology LVIDs:         2.80 cm LV e' medial:    12.35 cm/s LV PW:         0.70 cm LV E/e' medial:  8.9 LV IVS:        0.70 cm LV e' lateral:   15.75 cm/s                         LV E/e' lateral: 7.0  RIGHT VENTRICLE             IVC RV S prime:     19.85 cm/s  IVC diam: 1.60 cm LEFT ATRIUM         Index LA diam:    3.70 cm 1.98 cm/m  AORTIC VALVE AV Vmax:      183.59 cm/s AV Vmean:     114.120 cm/s AV VTI:       0.317 m AV Peak Grad: 13.5 mmHg AV Mean Grad: 6.0 mmHg  AORTA Ao Root diam: 3.40 cm MITRAL VALVE MV Area (PHT): 3.63 cm MV VTI:        0.40 m MV Decel Time: 209 msec MV E velocity: 109.67 cm/s MV A velocity: 142.67 cm/s MV E/A ratio:  0.77 Cristal Deer End MD Electronically signed by Yvonne Kendall MD Signature Date/Time: 11/18/2023/5:49:00 AM    Final    MR BRAIN WO CONTRAST Result Date: 11/17/2023 CLINICAL DATA:  Provided history: Neuro deficit, acute, stroke suspected. Possible new versus progressed CVA on CTA. Weakness. Aphasia. Facial droop. EXAM: MRI HEAD WITHOUT CONTRAST TECHNIQUE: Multiplanar, multiecho pulse sequences of the brain and surrounding structures were obtained without intravenous contrast. COMPARISON:  Head CT 11/17/2023. CT angiogram head/neck 11/11/2023. Brain MRI 11/11/2023. FINDINGS: Brain: No age-advanced or lobar predominant cerebral atrophy. Numerous small acute infarcts within the left cerebellar hemisphere and left superior cerebellar peduncle, the majority of which are new from the prior MRI of 11/11/2023. Several new small acute cortical infarcts are present within the right frontal, right parietal, left parietal and left occipital lobes. A new punctate acute infarct is also present within the left thalamus. Known patchy acute/early subacute infarcts elsewhere within the left frontal and left parietal lobes (MCA vascular territory). 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 Background mild multifocal T2 FLAIR hyperintense signal abnormality within the cerebral white matter, nonspecific but compatible with chronic small vessel ischemic disease. Punctate chronic microhemorrhage within the left occipital  lobe, unchanged (  series 10, image 25). No evidence of an intracranial mass. No extra-axial fluid collection. No midline shift. Vascular: Please see the recent prior CTA head/neck 11/11/2023. Small foci of susceptibility-weighted signal loss along the left frontal lobe, new from the prior MRI and suspicious for emboli within distal left MCA branches (series 10, image 46). Skull and upper cervical spine: No focal worrisome marrow lesion. Incompletely assessed cervical spondylosis. Sinuses/Orbits: No mass or acute finding within the imaged orbits. Prior bilateral ocular lens replacement. Minimal mucosal thickening within the right maxillary sinus. IMPRESSION: 1. Numerous small acute infarcts within the left cerebellar hemisphere/superior cerebellar peduncle, the majority of which are new from the prior brain MRI of 11/11/2023. Additionally, there are several new small acute cortical infarcts within the right frontal, right parietal, left parietal and left occipital lobes, as well as a new punctate acute infarct within the left thalamus. Involvement of multiple vascular territories concerning for an embolic process. New small foci of signal abnormality along the left frontal lobe suspicious for emboli within distal left middle cerebral artery branches. 2. Known patchy acute/early subacute infarcts elsewhere within the left frontal and left parietal lobes (MCA vascular territory). 3. Background mild cerebral white matter chronic small vessel ischemic disease. 4. Unchanged chronic lacunar infarct within the right caudate nucleus. Electronically Signed   By: Jackey Loge D.O.   On: 11/17/2023 16:31   DG Chest Portable 1 View Result Date: 11/17/2023 CLINICAL DATA:  Vomiting.  Weakness. EXAM: PORTABLE CHEST 1 VIEW COMPARISON:  11/10/2023 FINDINGS: Bilateral lung fields are clear. Bilateral costophrenic angles are clear. Stable cardio-mediastinal silhouette. No acute osseous abnormalities. The soft tissues are within normal  limits. IMPRESSION: No active disease. Electronically Signed   By: Jules Schick M.D.   On: 11/17/2023 11:49   CT Head Wo Contrast Result Date: 11/17/2023 CLINICAL DATA:  68 year old female neurologic deficit with slurred speech, right facial droop. Recent left MCA M2 occlusion, posterior left MCA infarcts, occasional other small scattered brain infarcts on CTA, MRI. EXAM: CT HEAD WITHOUT CONTRAST TECHNIQUE: Contiguous axial images were obtained from the base of the skull through the vertex without intravenous contrast. RADIATION DOSE REDUCTION: This exam was performed according to the departmental dose-optimization program which includes automated exposure control, adjustment of the mA and/or kV according to patient size and/or use of iterative reconstruction technique. COMPARISON:  Brain MRI 11/11/2023 and earlier FINDINGS: Brain: Posterior left MCA infarcts on MRI remain subtle by CT with no associated hemorrhage or mass effect (series 3, image 20). Small left cerebellum PICA territory infarct is now apparent on CT coronal image 52, and appears different from that on the recent MRI. No associated hemorrhage or mass effect. No acute intracranial hemorrhage identified. No midline shift, mass effect, or evidence of intracranial mass lesion. No ventriculomegaly. Vascular: Calcified atherosclerosis at the skull base. No suspicious intracranial vascular hyperdensity. Skull: Stable and intact. Sinuses/Orbits: Visualized paranasal sinuses and mastoids are stable and well aerated. Other: Mild rightward gaze. No other acute orbit or scalp soft tissue finding. IMPRESSION: 1. New or progressed Left cerebellar PICA territory infarct since the MRI on 11/11/2023. 2. Stable CT appearance of posterior left MCA territory infarcts. 3. No associated acute intracranial hemorrhage or mass effect. Electronically Signed   By: Odessa Fleming M.D.   On: 11/17/2023 10:15   [place] CORE BIOPSY (LYMPH NODES) Result Date: 11/16/2023 INDICATION:  Right lower lobe lung mass, lymphadenopathy and liver lesions. Enlarged right supraclavicular lymph node targeted for biopsy. EXAM: ULTRASOUND GUIDED CORE BIOPSY OF  RIGHT SUPRACLAVICULAR LYMPH NODE MEDICATIONS: None. ANESTHESIA/SEDATION: Moderate (conscious) sedation was employed during this procedure. A total of Versed 1.5 mg and Fentanyl 75 mcg was administered intravenously. Moderate Sedation Time: 19 minutes. The patient's level of consciousness and vital signs were monitored continuously by radiology nursing throughout the procedure under my direct supervision. PROCEDURE: The procedure, risks, benefits, and alternatives were explained to the patient. Questions regarding the procedure were encouraged and answered. The patient understands and consents to the procedure. A time-out was performed prior to initiating the procedure. The right neck was prepped with chlorhexidine in a sterile fashion, and a sterile drape was applied covering the operative field. A sterile gown and sterile gloves were used for the procedure. Local anesthesia was provided with 1% Lidocaine. After localizing an enlarged right supraclavicular lymph node, 18 gauge core biopsy samples were obtained through different portions of the lymph node. A total of 5 samples were obtained with 3 submitted on saline soaked Telfa gauze and 2 in formalin. Additional ultrasound was performed after biopsy. COMPLICATIONS: None immediate. FINDINGS: Several adjacent right supraclavicular lymph nodes are identified by ultrasound. The largest measures approximately 2.2 x 1.4 x 1.7 cm. Solid core biopsy samples were obtained. IMPRESSION: Ultrasound-guided core biopsy performed of an enlarged right supraclavicular lymph node measuring 2.2 cm in greatest diameter by ultrasound. Electronically Signed   By: Irish Lack M.D.   On: 11/16/2023 16:02   ECHOCARDIOGRAM COMPLETE Result Date: 11/12/2023    ECHOCARDIOGRAM REPORT   Patient Name:   Debra [name] Date of  Exam: 11/11/2023 Medical Rec #:  [id number]    Height:       64.0 in Accession #:    [id number]   Weight:       180.0 lb Date of Birth:  1956/02/23     BSA:          1.871 m Patient Age:    61 years     BP:           110/92 mmHg Patient Gender: F            HR:           80 bpm. Exam Location:  [facility] Procedure: 2D Echo, Cardiac Doppler and Color Doppler (Both Spectral and Color            Flow Doppler were utilized during procedure). Indications:     Elevated Troponin  History:         Patient has prior history of Echocardiogram examinations, most                  recent 04/25/2022. COPD, Arrythmias:Atrial Fibrillation; Risk                  Factors:Hypertension and Dyslipidemia. Obstructive sleep apnea.  Sonographer:     Daphine Deutscher RDCS Referring Phys:  [id number] Wilfred Curtis ONGE Diagnosing Phys: Julien Nordmann MD IMPRESSIONS  1. Left ventricular ejection fraction, by estimation, is 60 to 65%. The left ventricle has normal function. The left ventricle has no regional wall motion abnormalities. There is mild left ventricular hypertrophy. Left ventricular diastolic parameters are consistent with Grade I diastolic dysfunction (impaired relaxation).  2. Right ventricular systolic function is normal. The right ventricular size is normal. There is normal pulmonary artery systolic pressure. The estimated right ventricular systolic pressure is 21.3 mmHg.  3. The mitral valve is normal in structure. Mild to moderate mitral valve regurgitation. No evidence of mitral stenosis.  4. The aortic valve  is normal in structure. Aortic valve regurgitation is not visualized. No aortic stenosis is present.  5. The inferior vena cava is normal in size with greater than 50% respiratory variability, suggesting right atrial pressure of 3 mmHg. FINDINGS  Left Ventricle: Left ventricular ejection fraction, by estimation, is 60 to 65%. The left ventricle has normal function. The left ventricle has no regional wall motion abnormalities.  Strain was performed and the global longitudinal strain is indeterminate. The left ventricular internal cavity size was normal in size. There is mild left ventricular hypertrophy. Left ventricular diastolic parameters are consistent with Grade I diastolic dysfunction (impaired relaxation). Right Ventricle: The right ventricular size is normal. No increase in right ventricular wall thickness. Right ventricular systolic function is normal. There is normal pulmonary artery systolic pressure. The tricuspid regurgitant velocity is 2.02 m/s, and  with an assumed right atrial pressure of 5 mmHg, the estimated right ventricular systolic pressure is 21.3 mmHg. Left Atrium: Left atrial size was normal in size. Right Atrium: Right atrial size was normal in size. Pericardium: There is no evidence of pericardial effusion. Mitral Valve: The mitral valve is normal in structure. Mild to moderate mitral valve regurgitation. No evidence of mitral valve stenosis. Tricuspid Valve: The tricuspid valve is normal in structure. Tricuspid valve regurgitation is not demonstrated. No evidence of tricuspid stenosis. Aortic Valve: The aortic valve is normal in structure. Aortic valve regurgitation is not visualized. No aortic stenosis is present. Pulmonic Valve: The pulmonic valve was normal in structure. Pulmonic valve regurgitation is not visualized. No evidence of pulmonic stenosis. Aorta: The aortic root is normal in size and structure. Venous: The inferior vena cava is normal in size with greater than 50% respiratory variability, suggesting right atrial pressure of 3 mmHg. IAS/Shunts: No atrial level shunt detected by color flow Doppler. Additional Comments: 3D was performed not requiring image post processing on an independent workstation and was indeterminate.  LEFT VENTRICLE PLAX 2D LVIDd:         4.90 cm   Diastology LVIDs:         3.10 cm   LV e' medial:    9.36 cm/s LV PW:         1.00 cm   LV E/e' medial:  9.1 LV IVS:        1.00 cm    LV e' lateral:   9.32 cm/s LVOT diam:     2.00 cm   LV E/e' lateral: 9.2 LV SV:         56 LV SV Index:   30 LVOT Area:     3.14 cm  RIGHT VENTRICLE RV Basal diam:  3.30 cm RV S prime:     18.33 cm/s TAPSE (M-mode): 3.2 cm LEFT ATRIUM             Index        RIGHT ATRIUM           Index LA diam:        3.90 cm 2.08 cm/m   RA Area:     11.60 cm LA Vol (A2C):   50.9 ml 27.21 ml/m  RA Volume:   27.60 ml  14.75 ml/m LA Vol (A4C):   38.2 ml 20.42 ml/m LA Biplane Vol: 44.4 ml 23.73 ml/m  AORTIC VALVE LVOT Vmax:   98.00 cm/s LVOT Vmean:  64.000 cm/s LVOT VTI:    0.179 m  AORTA Ao Root diam: 3.30 cm MITRAL VALVE  TRICUSPID VALVE MV Area (PHT): 3.65 cm     TR Peak grad:   16.3 mmHg MV Decel Time: 208 msec     TR Vmax:        202.00 cm/s MR Peak grad: 82.4 mmHg MR Vmax:      454.00 cm/s   SHUNTS MV E velocity: 85.55 cm/s   Systemic VTI:  0.18 m MV A velocity: 123.50 cm/s  Systemic Diam: 2.00 cm MV E/A ratio:  0.69 Julien Nordmann MD Electronically signed by Julien Nordmann MD Signature Date/Time: 11/12/2023/1:59:01 PM    Final    MR ABDOMEN MRCP W WO CONTAST Result Date: 11/12/2023 CLINICAL DATA:  Evaluate liver lesions.  Chest mass. EXAM: MRI ABDOMEN WITHOUT AND WITH CONTRAST (INCLUDING MRCP) TECHNIQUE: Multiplanar multisequence MR imaging of the abdomen was performed both before and after the administration of intravenous contrast. Heavily T2-weighted images of the biliary and pancreatic ducts were obtained, and three-dimensional MRCP images were rendered by post processing. CONTRAST:  8mL GADAVIST GADOBUTROL 1 MMOL/ML IV SOLN COMPARISON:  CT AP 11/10/2023 FINDINGS: Lower chest: Small right pleural effusion. Partially visualized mass within the superior segment of right lower lobe noted. Hepatobiliary: There are multifocal bilobar, ill-defined areas of mild increased T2 signal noted within both lobes of liver which show hypoenhancement on the postcontrast images and restricted diffusion. At least 5  lesions are noted involving both lobes. The largest lesion is in segment 5 measuring 1.1 cm, image 17/8. Lesion in segment 2/3 measures 6 mm, image 10/8. Status post cholecystectomy. Mild intrahepatic bile duct dilatation. Fusiform dilatation of the common bile duct which measures 1.3 cm proximally. No signs of choledocholithiasis or mass. Pancreas:  No main duct dilatation, inflammation or mass identified. Spleen: Within the proximal portion of the spleen there are 2 wedge-shaped areas of peripheral hypoenhancement, image 25/31. Adrenals/Urinary Tract: There is a 1.3 cm nodule in the left adrenal gland which is indeterminate with only mild loss of signal on the out of phase sequences, image 29/9. This is a new finding when compared with the remote CT of the abdomen pelvis from 09/17/2020. Normal right adrenal gland. No kidney mass or signs of obstructive uropathy. Stomach/Bowel: Visualized portions within the abdomen are unremarkable. Vascular/Lymphatic: No aneurysm. Aortic atherosclerosis. No adenopathy. Other:  No ascites. Musculoskeletal: Multifocal abnormal areas of enhancement are identified throughout the visualized portions of the thoracic and lumbar spine which are concerning for osseous metastasis. IMPRESSION: 1. There are multifocal bilobar, ill-defined areas of mild increased T2 signal noted within both lobes of liver which show hypoenhancement on the postcontrast images and restricted diffusion. At least 5 lesions are noted involving both lobes. 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 2. Multifocal abnormal areas of enhancement are identified throughout the visualized portions of the thoracic and lumbar spine which are concerning for osseous metastasis. 3. There are 2 wedge-shaped areas of peripheral hypoenhancement within the proximal portion of the spleen which are concerning for splenic infarcts. 4. There is a 1.3 cm nodule in the left adrenal gland which is indeterminate with only mild loss  of signal on the out of phase sequences. This is a new finding when compared with the remote CT of the abdomen pelvis from 09/17/2020. Differential considerations include lipid poor adenoma versus a adrenal metastasis. 5. Small right pleural effusion. 6. Partially visualized mass within the superior segment of right lower lobe. 7. Status post cholecystectomy. Mild intrahepatic bile duct dilatation. Fusiform dilatation of the common bile duct which measures 1.3 cm proximally. No  signs of choledocholithiasis or mass. Electronically Signed   By: Signa Kell M.D.   On: 11/12/2023 05:07   MR 3D Recon At Scanner Result Date: 11/12/2023 CLINICAL DATA:  Evaluate liver lesions.  Chest mass. EXAM: MRI ABDOMEN WITHOUT AND WITH CONTRAST (INCLUDING MRCP) TECHNIQUE: Multiplanar multisequence MR imaging of the abdomen was performed both before and after the administration of intravenous contrast. Heavily T2-weighted images of the biliary and pancreatic ducts were obtained, and three-dimensional MRCP images were rendered by post processing. CONTRAST:  8mL GADAVIST GADOBUTROL 1 MMOL/ML IV SOLN COMPARISON:  CT AP 11/10/2023 FINDINGS: Lower chest: Small right pleural effusion. Partially visualized mass within the superior segment of right lower lobe noted. Hepatobiliary: There are multifocal bilobar, ill-defined areas of mild increased T2 signal noted within both lobes of liver which show hypoenhancement on the postcontrast images and restricted diffusion. At least 5 lesions are noted involving both lobes. The largest lesion is in segment 5 measuring 1.1 cm, image 17/8. Lesion in segment 2/3 measures 6 mm, image 10/8. Status post cholecystectomy. Mild intrahepatic bile duct dilatation. Fusiform dilatation of the common bile duct which measures 1.3 cm proximally. No signs of choledocholithiasis or mass. Pancreas:  No main duct dilatation, inflammation or mass identified. Spleen: Within the proximal portion of the spleen there are  2 wedge-shaped areas of peripheral hypoenhancement, image 25/31. Adrenals/Urinary Tract: There is a 1.3 cm nodule in the left adrenal gland which is indeterminate with only mild loss of signal on the out of phase sequences, image 29/9. This is a new finding when compared with the remote CT of the abdomen pelvis from 09/17/2020. Normal right adrenal gland. No kidney mass or signs of obstructive uropathy. Stomach/Bowel: Visualized portions within the abdomen are unremarkable. Vascular/Lymphatic: No aneurysm. Aortic atherosclerosis. No adenopathy. Other:  No ascites. Musculoskeletal: Multifocal abnormal areas of enhancement are identified throughout the visualized portions of the thoracic and lumbar spine which are concerning for osseous metastasis. IMPRESSION: 1. There are multifocal bilobar, ill-defined areas of mild increased T2 signal noted within both lobes of liver which show hypoenhancement on the postcontrast images and restricted diffusion. At least 5 lesions are noted involving both lobes. 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 2. Multifocal abnormal areas of enhancement are identified throughout the visualized portions of the thoracic and lumbar spine which are concerning for osseous metastasis. 3. There are 2 wedge-shaped areas of peripheral hypoenhancement within the proximal portion of the spleen which are concerning for splenic infarcts. 4. There is a 1.3 cm nodule in the left adrenal gland which is indeterminate with only mild loss of signal on the out of phase sequences. This is a new finding when compared with the remote CT of the abdomen pelvis from 09/17/2020. Differential considerations include lipid poor adenoma versus a adrenal metastasis. 5. Small right pleural effusion. 6. Partially visualized mass within the superior segment of right lower lobe. 7. Status post cholecystectomy. Mild intrahepatic bile duct dilatation. Fusiform dilatation of the common bile duct which measures 1.3 cm  proximally. No signs of choledocholithiasis or mass. Electronically Signed   By: Signa Kell M.D.   On: 11/12/2023 05:07   CT ANGIO HEAD NECK W WO CM Addendum Date: 11/11/2023 ADDENDUM REPORT: 11/11/2023 23:37 ADDENDUM: Findings discussed with Mariah Milling, NP via telephone at 10:33 p.m. Electronically Signed   By: Feliberto Harts M.D.   On: 11/11/2023 23:37   Result Date: 11/11/2023 CLINICAL DATA:  Stroke/TIA, determine embolic source EXAM: CT ANGIOGRAPHY HEAD AND NECK WITH AND WITHOUT  CONTRAST TECHNIQUE: Multidetector CT imaging of the head and neck was performed using the standard protocol during bolus administration of intravenous contrast. Multiplanar CT image reconstructions and MIPs were obtained to evaluate the vascular anatomy. Carotid stenosis measurements (when applicable) are obtained utilizing NASCET criteria, using the distal internal carotid diameter as the denominator. RADIATION DOSE REDUCTION: This exam was performed according to the departmental dose-optimization program which includes automated exposure control, adjustment of the mA and/or kV according to patient size and/or use of iterative reconstruction technique. CONTRAST:  75mL OMNIPAQUE IOHEXOL 350 MG/ML SOLN COMPARISON:  Same day MRI head. FINDINGS: CTA NECK FINDINGS Aortic arch: Aortic atherosclerosis. 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 Great vessel origins are patent. Right carotid system: Atherosclerosis at the carotid bifurcation without greater than 50% stenosis. Left carotid system: Atherosclerosis at the carotid bifurcation without greater than 50% stenosis. Vertebral arteries: Codominant. No evidence of dissection, stenosis (50% or greater), or occlusion. Skeleton: No evidence of acute abnormality on limited assessment. Multilevel degenerative change. Other neck: Approximately 2.3 cm left thyroid nodule. Upper chest: Please see same day CT chest for intrathoracic findings.  Emphysema. Review of the MIP images confirms the above findings CTA HEAD FINDINGS Anterior circulation: Bilateral intracranial ICAs are patent. Approximately 2 mm medially directed outpouching arising from the right cavernous ICA, which could represent an infundibulum or aneurysm. Bilateral M1 MCAs are patent. 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 Bilateral ACAs are patent without proximal hemodynamically significant stenosis. Posterior circulation: Bilateral intradural vertebral arteries, basilar artery and bilateral posterior cerebral arteries are patent without proximal hemodynamically significant stenosis. Venous sinuses: As permitted by contrast timing, patent. Review of the MIP images confirms the above findings The ordering provider has been paged at the time of dictation for call of report. IMPRESSION: 1. 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 2. Approximately 2 mm medially directed outpouching arising from the right cavernous ICA, which could represent an infundibulum or aneurysm. 3. Please see same day CT chest for intrathoracic findings. 4. Approximately 2.3 cm left thyroid nodule. Recommend thyroid US (ref: J Am Coll Radiol. 2015 Feb;12(2): 143-50). 5. Aortic Atherosclerosis (ICD10-I70.0) and Emphysema (ICD10-J43.9). Electronically Signed: By: Feliberto Harts M.D. On: 11/11/2023 23:14   MR BRAIN W WO CONTRAST Result Date: 11/11/2023 CLINICAL DATA:  right facial weakness, malignancy also on the ddx EXAM: MRI HEAD WITHOUT AND WITH CONTRAST TECHNIQUE: Multiplanar, multiecho pulse sequences of the brain and surrounding structures were obtained without and with intravenous contrast. CONTRAST:  8mL GADAVIST GADOBUTROL 1 MMOL/ML IV SOLN COMPARISON:  CT head November 11, 2023. 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 Mild edema without mass effect. No midline shift. Probable  additional punctate acute infarct in the inferior left cerebellum. No evidence of acute hemorrhage, mass lesion, or hydrocephalus. Vascular: Major arterial flow voids are maintained at the skull base. Skull and upper cervical spine: Normal marrow signal. Sinuses/Orbits: Clear sinuses.  No acute orbital findings. IMPRESSION: 1. 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 Probable additional punctate acute infarct in the inferior left cerebellum. These results will be called to the ordering clinician or representative by the Radiologist Assistant, and communication documented in the PACS or [employer]. Electronically Signed   By: Feliberto Harts M.D.   On: 11/11/2023 20:27   CT CHEST W CONTRAST Result Date: 11/11/2023 CLINICAL  DATA:  Liver lesions with history of breast cancer. EXAM: CT CHEST WITH CONTRAST TECHNIQUE: Multidetector CT imaging of the chest was performed during intravenous contrast administration. RADIATION DOSE REDUCTION: This exam was performed according to the departmental dose-optimization program which includes automated exposure control, adjustment of the mA and/or kV according to patient size and/or use of iterative reconstruction technique. CONTRAST:  75mL OMNIPAQUE IOHEXOL 300 MG/ML  SOLN COMPARISON:  CT abdomen and pelvis 11/10/2023. FINDINGS: Cardiovascular: Heart is borderline enlarged/mildly enlarged. Aorta is normal in size. There is no pericardial effusion. There are atherosclerotic calcifications of the aorta. Mediastinum/Nodes: There is a heterogeneous left thyroid nodule attaining cystic and solid components measuring 2.6 x 1.5 cm. There are multiple enlarged right paratracheal lymph nodes measuring up to 2.0 x 2.9 cm. Enlarged subcarinal lymph node measures 1.2 cm short axis. There is a prominent right lower esophageal lymph node measuring 8 mm short axis. There are multiple enlarged right hilar lymph  nodes measuring up to 1.4 x 1.8 cm. Visualized esophagus is within normal limits. Lungs/Pleura: Mild emphysematous changes are present. There is scarring in both lung apices. Multifocal airspace disease is seen throughout the right lower lobe, predominantly posteriorly and peripherally. Some areas are slightly nodular for example superior segment measuring 3.2 by 2.0 cm. A small air-fluid level seen in this region. No other air-fluid levels are seen. Additional nodular density seen in the right lower lobe image 5/92 measuring 9 mm. Smaller nodules are seen scattered throughout the right lower lobe measuring 5 mm or less. There is a trace right pleural effusion. There is a right middle lobe nodule measuring 5 mm image 5/122. There are prominent intrapulmonary lymph nodes along the right major fissure. There are 2 mm nodular densities in the left upper lobe. Left lung is otherwise clear. Upper Abdomen: Subcentimeter hypodensities in the liver and left adrenal mass are unchanged. Please see CT abdomen and pelvis performed same day for further description. Musculoskeletal: There is anterior intramuscular chest wall edema on the right. Focal density is seen in the medial right breast extending to the skin surface measuring 1.2 x 2.3 cm image 3/81. There some associated diffuse right breast skin thickening. No acute fracture. There is a single 5 mm sclerotic density in the T3 vertebral body. IMPRESSION: 1. Multifocal airspace disease throughout the right lower lobe, predominantly posteriorly and peripherally. Single area is slightly nodular in the superior segment containing an air-fluid level worrisome for necrotic pneumonia or necrotic nodule. Findings may be infectious/inflammatory, but neoplasm not excluded. 2. Trace right pleural effusion. 3. Mediastinal and right hilar lymphadenopathy. 4. Additional right lower lobe and right middle lobe pulmonary nodules measuring 5 mm. 5. Left thyroid nodule measuring 2.6 cm.  Recommend further evaluation with ultrasound. 6. Focal density in the medial right breast extending to the skin surface measuring 1.2 x 2.3 cm. Correlate with physical exam. 7. Single 5 mm sclerotic density in the T3 vertebral body. Aortic Atherosclerosis (ICD10-I70.0) and Emphysema (ICD10-J43.9). Electronically Signed   By: Darliss Cheney M.D.   On: 11/11/2023 17:53   CT HEAD WO CONTRAST ( ) Result Date: 11/11/2023 CLINICAL DATA:  Right facial weakness. EXAM: CT HEAD WITHOUT CONTRAST TECHNIQUE: Contiguous axial images were obtained from the base of the skull through the vertex without intravenous contrast. RADIATION DOSE REDUCTION: This exam was performed according to the departmental dose-optimization program which includes automated exposure control, adjustment of the mA and/or kV according to patient size and/or use of iterative reconstruction technique. COMPARISON:  Head CT  07/21/2022 FINDINGS: Brain: There are new small hypodensities in the left frontoparietal white matter at the level of the posterior centrum semiovale (series 3, image 20), and there is a new subcentimeter focus of cortical hypodensity at the level of the left parietal operculum (series 6, image 46), suspicious for recent infarcts. No intracranial hemorrhage, mass, midline shift, or extra-axial fluid collection is identified. Cerebral volume is normal. The ventricles are normal in size. Vascular: No hyperdense vessel. Skull: No acute fracture or suspicious lesion. Sinuses/Orbits: Visualized paranasal sinuses and mastoid air cells are clear. Bilateral cataract extraction. Other: None. 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 A brain MRI is in progress and will provide further evaluation. Electronically Signed   By: Sebastian Ache M.D.   On: 11/11/2023 15:13   DG Chest 2 View Result Date: 11/10/2023 CLINICAL DATA:  dyspnea EXAM: CHEST - 2 VIEW COMPARISON:  Chest x-ray 07/21/2022, chest  x-ray 02/15/2021 FINDINGS: The heart and mediastinal contours are unchanged. Prominent hilar vasculature. No focal consolidation. No pulmonary edema. No pleural effusion. No pneumothorax. No acute osseous abnormality. IMPRESSION: Prominent hilar vasculature suggestive of pulmonary venous congestion. Underlying lymph nodes not excluded. Electronically Signed   By: Tish Frederickson M.D.   On: 11/10/2023 23:50   CT ABDOMEN PELVIS W CONTRAST Result Date: 11/10/2023 CLINICAL DATA:  Acute nonlocalized abdominal pain. Vomiting and diarrhea. EXAM: CT ABDOMEN AND PELVIS WITH CONTRAST TECHNIQUE: Multidetector CT imaging of the abdomen and pelvis was performed using the standard protocol following bolus administration of intravenous contrast. RADIATION DOSE REDUCTION: This exam was performed according to the departmental dose-optimization program which includes automated exposure control, adjustment of the mA and/or kV according to patient size and/or use of iterative reconstruction technique. CONTRAST:  OMNIPAQUE IOHEXOL 300 MG/ML  SOLN COMPARISON:  None Available. FINDINGS: Lower chest: Focal consolidation suggested in the right lung base with small right pleural effusion. This may represent pneumonia or less likely aspiration. Emphysematous changes in the lungs. Hepatobiliary: Multiple scattered low-attenuation lesions in the liver, most are subcentimeter in size. Largest is in the medial segment left lobe measuring 1.3 cm in diameter. Enhancement pattern is indeterminate. Consider follow-up MRI for further characterization. The gallbladder is not visualized, likely surgically absent. Mild intrahepatic bile duct dilatation is most likely due to postoperative change. No common duct stones are identified. Pancreas: Unremarkable. No pancreatic ductal dilatation or surrounding inflammatory changes. Spleen: Segmental low-attenuation in the upper spleen likely represents a splenic infarct. Spleen size is normal.  Adrenals/Urinary Tract: 1.3 cm diameter left adrenal gland nodule with density measurement of 69 Hounsfield units. Renal nephrograms are symmetrical. No solid mass identified. No hydronephrosis or hydroureter. Bladder is normal. Stomach/Bowel: Stomach is within normal limits. Appendix appears normal. No evidence of bowel wall thickening, distention, or inflammatory changes. Vascular/Lymphatic: Aortic atherosclerosis. No enlarged abdominal or pelvic lymph nodes. Reproductive: Uterus and bilateral adnexa are unremarkable. Other: No abdominal wall hernia or abnormality. No abdominopelvic ascites. Musculoskeletal: Postoperative fixation of the right hip. No acute bony abnormalities. IMPRESSION: 1. Nonspecific low-attenuation lesions in the liver, largest measuring 1.3 cm diameter. Enhancement pattern is indeterminate. Consider follow-up MRI for further characterization. 2. Mild intrahepatic bile duct dilatation is likely postoperative in the setting of cholecystectomy. 3. Left adrenal mass measuring 1.3 cm, probable benign adenoma. Recommend follow-up adrenal washout CT in 1 year. If stable for = 1 year, no further follow-up imaging. JACR 2017 Aug; 14(8):1038-44, JCAT 2016 Mar-Apr; 40(2):194-200, Urol J 2006 Spring; 3(2):71-4. 4. Aortic atherosclerosis.  5. Focal area of consolidation suggested in the right lung base with small right pleural effusion, likely pneumonia. Electronically Signed   By: Burman Nieves M.D.   On: 11/10/2023 23:30      Subjective: Seen and examined on the day of discharge.  Stable no distress.  Appropriate for discharge home.  Discharge Exam: Vitals:   11/20/23 0455 11/20/23 0748  BP: 114/62 125/64  Pulse: 67 79  Resp: 20   Temp: 97.9 F (36.6 C) 98.5 F (36.9 C)  SpO2: 98% 100%   Vitals:   11/19/23 2029 11/19/23 2354 11/20/23 0455 11/20/23 0748  BP: (!) 103/47 100/88 114/62 125/64  Pulse: 77 86 67 79  Resp: 20 20 20    Temp: 98.8 F (37.1 C) 98.2 F (36.8 C) 97.9 F  (36.6 C) 98.5 F (36.9 C)  TempSrc:      SpO2: 98% 100% 98% 100%  Weight:      Height:        General: Pt is alert, awake, not in acute distress Cardiovascular: RRR, S1/S2 +, no rubs, no gallops Respiratory: CTA bilaterally, no wheezing, no rhonchi Abdominal: Soft, NT, ND, bowel sounds + Extremities: no edema, no cyanosis    The results of significant diagnostics from this hospitalization (including imaging, microbiology, ancillary and laboratory) are listed below for reference.     Microbiology: Recent Results (from the past 240 hours)  Resp panel by RT-PCR (RSV, Flu A&B, Covid) Anterior Nasal Swab     Status: None   Collection Time: 11/10/23 11:05 PM   Specimen: Anterior Nasal Swab  Result Value Ref Range Status   SARS Coronavirus 2 by RT PCR NEGATIVE NEGATIVE Final    Comment: (NOTE) SARS-CoV-2 target nucleic acids are NOT DETECTED.  The SARS-CoV-2 RNA is generally detectable in upper respiratory specimens during the acute phase of infection. The lowest concentration of SARS-CoV-2 viral copies this assay can detect is 138 copies/mL. A negative result does not preclude SARS-Cov-2 infection and should not be used as the sole basis for treatment or other patient management decisions. A negative result may occur with  improper specimen collection/handling, submission of specimen other than nasopharyngeal swab, presence of viral mutation(s) within the areas targeted by this assay, and inadequate number of viral copies(<138 copies/mL). A negative result must be combined with clinical observations, patient history, and epidemiological information. The expected result is Negative.  Fact Sheet for Patients:  BloggerCourse.com  Fact Sheet for Healthcare Providers:  SeriousBroker.it  This test is no t yet approved or cleared by the [place] FDA and  has been authorized for detection and/or diagnosis of SARS-CoV-2 by FDA  under an Emergency Use Authorization (EUA). This EUA will remain  in effect (meaning this test can be used) for the duration of the COVID-19 declaration under Section 564(b)(1) of the Act, 21 U.S.C.section 360bbb-3(b)(1), unless the authorization is terminated  or revoked sooner.       Influenza A by PCR NEGATIVE NEGATIVE Final   Influenza B by PCR NEGATIVE NEGATIVE Final    Comment: (NOTE) The Xpert Xpress SARS-CoV-2/FLU/RSV plus assay is intended as an aid in the diagnosis of influenza from Nasopharyngeal swab specimens and should not be used as a sole basis for treatment. Nasal washings and aspirates are unacceptable for Xpert Xpress SARS-CoV-2/FLU/RSV testing.  Fact Sheet for Patients: BloggerCourse.com  Fact Sheet for Healthcare Providers: SeriousBroker.it  This test is not yet approved or cleared by the [place] FDA and has been authorized for detection and/or diagnosis  of SARS-CoV-2 by FDA under an Emergency Use Authorization (EUA). This EUA will remain in effect (meaning this test can be used) for the duration of the COVID-19 declaration under Section 564(b)(1) of the Act, 21 U.S.C. section 360bbb-3(b)(1), unless the authorization is terminated or revoked.     Resp Syncytial Virus by PCR NEGATIVE NEGATIVE Final    Comment: (NOTE) Fact Sheet for Patients: BloggerCourse.com  Fact Sheet for Healthcare Providers: SeriousBroker.it  This test is not yet approved or cleared by the [place] FDA and has been authorized for detection and/or diagnosis of SARS-CoV-2 by FDA under an Emergency Use Authorization (EUA). This EUA will remain in effect (meaning this test can be used) for the duration of the COVID-19 declaration under Section 564(b)(1) of the Act, 21 U.S.C. section 360bbb-3(b)(1), unless the authorization is terminated or revoked.  Performed at [facility], [address] [place], [place] [address]   Blood culture (routine x 2)     Status: None   Collection Time: 11/11/23 12:10 AM   Specimen: BLOOD  Result Value Ref Range Status   Specimen Description BLOOD LEFT ANTECUBITAL  Final   Special Requests   Final    BOTTLES DRAWN AEROBIC AND ANAEROBIC Blood Culture adequate volume   Culture   Final    NO GROWTH 5 DAYS Performed at [facility], [address], [place], [place] [address]    Report Status 11/16/2023 FINAL  Final  Blood culture (routine x 2)     Status: None   Collection Time: 11/11/23 12:39 AM   Specimen: BLOOD  Result Value Ref Range Status   Specimen Description BLOOD BLOOD RIGHT HAND  Final   Special Requests   Final    BOTTLES DRAWN AEROBIC AND ANAEROBIC Blood Culture adequate volume   Culture   Final    NO GROWTH 5 DAYS Performed at [facility], [address], [place], [place] [address]    Report Status 11/16/2023 FINAL  Final  MRSA Next Gen by PCR, Nasal     Status: None   Collection Time: 11/12/23  1:40 AM   Specimen: Nasal Mucosa; Nasal Swab  Result Value Ref Range Status   MRSA by PCR Next Gen NOT DETECTED NOT DETECTED Final    Comment: (NOTE) The GeneXpert MRSA Assay (FDA approved for NASAL specimens only), is one component of a comprehensive MRSA colonization surveillance program. It is not intended to diagnose MRSA infection nor to guide or monitor treatment for MRSA infections. Test performance is not FDA approved in patients less than 44 years old. Performed at [facility], [address] [place], [place] [address]      Labs: BNP (last 3 results) Recent Labs    11/11/23 0510  BNP 77.5   Basic Metabolic Panel: Recent Labs  Lab 11/17/23 0910 11/20/23 0838  NA 139 140  K 3.6 3.7  CL 102 101  CO2 29 31  GLUCOSE 122* 109*  BUN 15 13  CREATININE 0.90 0.92  CALCIUM 9.0 9.2   Liver Function Tests: Recent Labs  Lab 11/17/23 0910  AST 42*   ALT 67*  ALKPHOS 49  BILITOT 0.7  PROT 6.9  ALBUMIN 3.2*   Recent Labs  Lab 11/17/23 0910  LIPASE 49   No results for input(s): "AMMONIA" in the last 168 hours. CBC: Recent Labs  Lab 11/17/23 0910 11/18/23 0250 11/19/23 0413 11/20/23 0632 11/20/23 0838  WBC 11.7* 10.0 9.3 8.6 9.0  NEUTROABS 9.5*  --   --   --  6.1  HGB 9.2* 8.1* 7.8* 8.0* 8.1*  HCT 27.7* 24.0* 24.0* 24.0* 23.8*  MCV 92.3 90.6 93.0 92.3 91.2  PLT 204 183 184 199 184   Cardiac Enzymes: No results for input(s): "CKTOTAL", "CKMB", "CKMBINDEX", "TROPONINI" in the last 168 hours. BNP: Invalid input(s): "POCBNP" CBG: No results for input(s): "GLUCAP" in the last 168 hours. D-Dimer No results for input(s): "DDIMER" in the last 72 hours. Hgb A1c No results for input(s): "HGBA1C" in the last 72 hours. Lipid Profile No results for input(s): "CHOL", "HDL", "LDLCALC", "TRIG", "CHOLHDL", "LDLDIRECT" in the last 72 hours. Thyroid function studies No results for input(s): "TSH", "T4TOTAL", "T3FREE", "THYROIDAB" in the last 72 hours.  Invalid input(s): "FREET3" Anemia work up Recent Labs    11/19/23 0832 11/20/23 0632  VITAMINB12 7,289*  --   RETICCTPCT  --  5.4*   Urinalysis    Component Value Date/Time   COLORURINE YELLOW (A) 11/11/2023 0137   APPEARANCEUR CLEAR (A) 11/11/2023 0137   APPEARANCEUR Clear 09/26/2020 1501   LABSPEC >1.046 (H) 11/11/2023 0137   LABSPEC 1.023 11/30/2014 1052   PHURINE 5.0 11/11/2023 0137   GLUCOSEU NEGATIVE 11/11/2023 0137   GLUCOSEU Negative 11/30/2014 1052   HGBUR SMALL (A) 11/11/2023 0137   BILIRUBINUR NEGATIVE 11/11/2023 0137   BILIRUBINUR Negative 09/26/2020 1501   BILIRUBINUR Negative 11/30/2014 1052   KETONESUR 20 (A) 11/11/2023 0137   PROTEINUR NEGATIVE 11/11/2023 0137   NITRITE NEGATIVE 11/11/2023 0137   LEUKOCYTESUR NEGATIVE 11/11/2023 0137   LEUKOCYTESUR Negative 11/30/2014 1052   Sepsis Labs Recent Labs  Lab 11/18/23 0250 11/19/23 0413  11/20/23 0632 11/20/23 0838  WBC 10.0 9.3 8.6 9.0   Microbiology Recent Results (from the past 240 hours)  Resp panel by RT-PCR (RSV, Flu A&B, Covid) Anterior Nasal Swab     Status: None   Collection Time: 11/10/23 11:05 PM   Specimen: Anterior Nasal Swab  Result Value Ref Range Status   SARS Coronavirus 2 by RT PCR NEGATIVE NEGATIVE Final    Comment: (NOTE) SARS-CoV-2 target nucleic acids are NOT DETECTED.  The SARS-CoV-2 RNA is generally detectable in upper respiratory specimens during the acute phase of infection. The lowest concentration of SARS-CoV-2 viral copies this assay can detect is 138 copies/mL. A negative result does not preclude SARS-Cov-2 infection and should not be used as the sole basis for treatment or other patient management decisions. A negative result may occur with  improper specimen collection/handling, submission of specimen other than nasopharyngeal swab, presence of viral mutation(s) within the areas targeted by this assay, and inadequate number of viral copies(<138 copies/mL). A negative result must be combined with clinical observations, patient history, and epidemiological information. The expected result is Negative.  Fact Sheet for Patients:  BloggerCourse.com  Fact Sheet for Healthcare Providers:  SeriousBroker.it  This test is no t yet approved or cleared by the [place] FDA and  has been authorized for detection and/or diagnosis of SARS-CoV-2 by FDA under an Emergency Use Authorization (EUA). This EUA will remain  in effect (meaning this test can be used) for the duration of the COVID-19 declaration under Section 564(b)(1) of the Act, 21 U.S.C.section 360bbb-3(b)(1), unless the authorization is terminated  or revoked sooner.       Influenza A by PCR NEGATIVE NEGATIVE Final   Influenza B by PCR NEGATIVE NEGATIVE Final    Comment: (NOTE) The Xpert Xpress SARS-CoV-2/FLU/RSV plus assay  is intended as an aid in the diagnosis of influenza from Nasopharyngeal swab specimens and should  not be used as a sole basis for treatment. Nasal washings and aspirates are unacceptable for Xpert Xpress SARS-CoV-2/FLU/RSV testing.  Fact Sheet for Patients: BloggerCourse.com  Fact Sheet for Healthcare Providers: SeriousBroker.it  This test is not yet approved or cleared by the [place] FDA and has been authorized for detection and/or diagnosis of SARS-CoV-2 by FDA under an Emergency Use Authorization (EUA). This EUA will remain in effect (meaning this test can be used) for the duration of the COVID-19 declaration under Section 564(b)(1) of the Act, 21 U.S.C. section 360bbb-3(b)(1), unless the authorization is terminated or revoked.     Resp Syncytial Virus by PCR NEGATIVE NEGATIVE Final    Comment: (NOTE) Fact Sheet for Patients: BloggerCourse.com  Fact Sheet for Healthcare Providers: SeriousBroker.it  This test is not yet approved or cleared by the [place] FDA and has been authorized for detection and/or diagnosis of SARS-CoV-2 by FDA under an Emergency Use Authorization (EUA). This EUA will remain in effect (meaning this test can be used) for the duration of the COVID-19 declaration under Section 564(b)(1) of the Act, 21 U.S.C. section 360bbb-3(b)(1), unless the authorization is terminated or revoked.  Performed at [facility], [address] [place], [place] [address]   Blood culture (routine x 2)     Status: None   Collection Time: 11/11/23 12:10 AM   Specimen: BLOOD  Result Value Ref Range Status   Specimen Description BLOOD LEFT ANTECUBITAL  Final   Special Requests   Final    BOTTLES DRAWN AEROBIC AND ANAEROBIC Blood Culture adequate volume   Culture   Final    NO GROWTH 5 DAYS Performed at [facility], [address]  [place], [place] [address]    Report Status 11/16/2023 FINAL  Final  Blood culture (routine x 2)     Status: None   Collection Time: 11/11/23 12:39 AM   Specimen: BLOOD  Result Value Ref Range Status   Specimen Description BLOOD BLOOD RIGHT HAND  Final   Special Requests   Final    BOTTLES DRAWN AEROBIC AND ANAEROBIC Blood Culture adequate volume   Culture   Final    NO GROWTH 5 DAYS Performed at [facility], [address] [place], [place] [address]    Report Status 11/16/2023 FINAL  Final  MRSA Next Gen by PCR, Nasal     Status: None   Collection Time: 11/12/23  1:40 AM   Specimen: Nasal Mucosa; Nasal Swab  Result Value Ref Range Status   MRSA by PCR Next Gen NOT DETECTED NOT DETECTED Final    Comment: (NOTE) The GeneXpert MRSA Assay (FDA approved for NASAL specimens only), is one component of a comprehensive MRSA colonization surveillance program. It is not intended to diagnose MRSA infection nor to guide or monitor treatment for MRSA infections. Test performance is not FDA approved in patients less than 72 years old. Performed at [facility], [address], [place], [place] [address]      Time coordinating discharge: Over 30 minutes  SIGNED:   Tresa Moore, MD  [facility] 11/20/2023, 12:43 PM Pager   If 7PM-7AM, please contact night-coverage

## 2023-11-20 NOTE — Consult Note (Addendum)
 PHARMACY - ANTICOAGULATION CONSULT NOTE  Pharmacy Consult for Heparin Indication: chest pain/ACS and atrial fibrillation  Allergies  Allergen Reactions   Cephalexin Hives   Strawberry Extract Hives   Patient Measurements: Height: 5\' 4"  (162.6 cm) Weight: 81.6 kg (179 lb 14.3 oz) IBW/kg (Calculated) : 54.7 HEPARIN DW (KG): 72.3  Vital Signs: Temp: 97.9 F (36.6 C) (04/04 0455) BP: 114/62 (04/04 0455) Pulse Rate: 67 (04/04 0455)  Labs: Recent Labs    11/17/23 0910 11/17/23 0910 11/17/23 1200 11/17/23 1829 11/18/23 0250 11/18/23 1149 11/18/23 2125 11/19/23 0413 11/19/23 1258 11/19/23 2157 11/20/23 0632  HGB 9.2*  --   --   --  8.1*  --   --  7.8*  --   --  8.0*  HCT 27.7*  --   --   --  24.0*  --   --  24.0*  --   --  24.0*  PLT 204  --   --   --  183  --   --  184  --   --  199  APTT  --    < >  --  39* 53* >200*   < > 65* 61* 57* 102*  LABPROT 14.7  --   --   --   --   --   --   --   --   --   --   INR 1.1  --   --   --   --   --   --   --   --   --   --   HEPARINUNFRC  --    < >  --  0.70 0.63  --   --  0.53 0.62  --  0.94*  CREATININE 0.90  --   --   --   --   --   --   --   --   --   --   TROPONINIHS 481*  --  579* 791*  --  405*  --   --   --   --   --    < > = values in this interval not displayed.   Estimated Creatinine Clearance: 61 mL/min (by C-G formula based on SCr of 0.9 mg/dL).  Medical History: Past Medical History:  Diagnosis Date   Anxiety    Aortic atherosclerosis (HCC)    Arthritis    Atrial fibrillation (HCC) 08/01/2020   a.) CHA2DS2-VASc = 4 (age, sex, HTN, aortic plaque). b.) chronically anticoagulated using apixaban   Breast cancer, right breast (HCC) 01/08/2021   a.) Stage IB ([id number]) invasive mammary carcinoma of the RIGHT breast; grade I, ER/PR (+) and HER2/neu (+). Tx with neoadjuvant TCHP chemotherapy.   Carotid bruit    L --nl doppler 5/09- and again 5/13 with 0-39% stenosis bilat   Constipation    COPD (chronic obstructive  pulmonary disease) (HCC)    Diastolic dysfunction 08/02/2020   a.) TTE 08/02/2020: EF 60-65%; G1DD; triv MR/AR. b.) TTE 02/05/2021: ED 55-60%; G1DD; GLS -19.0%. c.) TTE 05/07/2021: TTE 55-60%; GLS -20.3%.   Family history of brain cancer    Family history of breast cancer    Family history of kidney cancer    Family history of lung cancer    Fatigue    Fracture of femoral neck, right (HCC) 2015   GERD (gastroesophageal reflux disease)    GI (gastrointestinal bleed)    [name]   Hepatitis    Hyperlipidemia    Hypertension  Left arm pain    Leg pain    Chronic pain R leg from injury   Long term current use of anticoagulant    a.) apixaban   OSA and COPD overlap syndrome (HCC)    a.) no nocurnal PAP therapy; does utilize supplemental oxygen.   Osteopenia    Other organic sleep disorders    Supplemental oxygen dependent    Tobacco abuse    Medications:  Apixaban 5mg  BID Last dose: last reported 3/27 in the morning, however, patient cannot remember if she took a dose after being discharged yesterday -- Per patient, she can't remember if she took her apixaban yesterday evening with all of her other medications, and unable to obtain baseline levels prior to starting infusion  Assessment: 68 year old female with history of A-fib, COPD on 3 L home O2, breast cancer on chemotherapy, HTN presenting to the emergency department for evaluation of vomiting.  Patient reports that she developed recurrent nausea with 3 episodes of vomiting earlier today leading her to call EMS.   Patient was just discharged yesterday(3/31). At that time she presented with vomiting and diarrhea and was found to have a transaminitis with liver lesions for which she had a liver biopsy performed. During her admission she was noted to have right facial weakness for which MRI demonstrated multiple small acute infarcts possibly related to embolic event in the setting of poor absorption of her Eliquis secondary to nausea  and vomiting. Pharmacy has been consulted for continuous heparin infusion monitoring and dosing.  Baseline labs: Hgb 9.2 Plt 204  Goal of Therapy:  Heparin level 0.3-0.7 units/ml aPTT 66-102 seconds Monitor platelets by anticoagulation protocol: Yes  Heparin/aPTT Date/Time HL/aPTT Clinical Assessment 4/1@1829  0.70/39 Dosing based on aPTT; SUBtherapeutic  4/2 @0250  0.63/53 aPTT, SUBtherapeutic 4/2@1149  -- / >200 aPTT, SUPRAtherapeutic 4/2@2125  --/42   aPTT subtherapeutic 4/3  0413 0.53/65 aPTT slightly subtherapeutic 4/3  1258 0.62/61 aPTT subtherapeutic 4/3  2157 -- / 57  aPTT subtherapeutic 4/4  0632 0.94/102 aPTT therapeutic x 1    Plan:  Continue heparin infusion at 1600 units/hour Recheck aPTT in 6hrs to confirm Continue to monitor CBC with AM labs  Little Bashore Rodriguez-Guzman PharmD, BCPS 11/20/2023 7:23 AM

## 2023-11-20 NOTE — Progress Notes (Signed)
 PROGRESS NOTE    Debra [name]  ZOX:[id number] DOB: 07/10/1956 DOA: 11/17/2023 PCP: Barbette Reichmann, MD    Brief Narrative:  68 y.o. 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  Patient noted to have been recently admitted March 25 to March 31 for issues including intractable nausea and vomiting, acute embolic CVA, transaminitis with new liver lesions.  Had fairly extensive workup at that time with MRI showing small acute infarcts posterior left MCA distribution with concern for hypercoagulable state versus malignancy versus cardiac emboli.  Neurology was formally evaluated.  Patient was placed on IV heparin drip until biopsy was completed in the setting of liver lesion.  Oncology did not feel that patient had Eliquis failure.  However thought that patient had a hard time absorbing medication secondary to recurrent nausea vomiting and diarrhea.  Was discharged on Eliquis.  Per report, patient had significant nausea and malaise at home.  No true chest pain.  No focal diaphoresis or sweating.  No belly pain or diarrhea.  Patient with noted predominate weakness slurred speech and right facial droop.  Has not had any missed dose of Eliquis.   Assessment & Plan:   Principal Problem:   Recurrent cerebrovascular accidents (CVAs) (HCC) Active Problems:   NSTEMI (non-ST elevated myocardial infarction) (HCC)   History of CVA (cerebrovascular accident)   Paroxysmal atrial fibrillation (HCC)   Hyperlipidemia   COPD with chronic bronchitis (HCC)   History of breast cancer   Elevated troponin   Acute CVA (cerebrovascular accident) (HCC)   Nausea and vomiting  Elevated troponin Cardiology following.  Troponin elevation relatively mild.  Nonspecific.   Favored to reflect supply/demand mismatch in the setting of recurrent CVA.  No EKG changes or chest pain to suggest true NSTEMI. Plan: Completed heparin GTT for 48 hours.  Transition to subcutaneous Lovenox 1 mg/kg.  Medically ready for discharge.  Will need placement at CIR.  History of CVA (cerebrovascular accident) New cerebellar CVA Admitted March 25 through March 21 for noted embolic CVA. New infarcts noted in cerebellum on repeat image Not considered a treatment failure for Eliquis as patient is having difficulty tolerating due to intractable nausea and vomiting Suspect poor absorption as a result Plan: Weight-based Lovenox 1 mg/kg every 12 hours.  Paroxysmal atrial fibrillation (HCC) Eliquis discontinued in favor of weight-based Lovenox.  Continue home diltiazem.     History of breast cancer Baseline history of breast cancer followed by Dr. Cathie Hoops Noted liver lesion with concern for metastatic etiology status post biopsy during recent admission.  Last biopsies with indeterminate diagnosis.  Oncology recommending repeat biopsy. Plan: Repeat biopsy completed 4/3.  Oncology follow-up for results.   COPD with chronic bronchitis (HCC) Stable from a respiratory standpoint Continue home inhalers   Hyperlipidemia Cont statin      DVT prophylaxis: Lovenox subcu 1 mg/kg Code Status: Full Family Communication: None today Disposition Plan: Status is: Inpatient Remains inpatient appropriate because: Unsafe discharge plan.  CIR referral in progress.     Level of care: Telemetry Cardiac  Consultants:  Cardiology Neurology  Procedures:  None  Antimicrobials: None   Subjective: Seen and examined.  No acute distress.  No complaints this morning.  Objective: Vitals:   11/19/23 2029 11/19/23 2354 11/20/23 0455 11/20/23 0748  BP: (!) 103/47 100/88 114/62  125/64  Pulse: 77 86 67 79  Resp: 20 20 20    Temp: 98.8 F (37.1 C) 98.2 F (36.8 C) 97.9 F (36.6 C) 98.5 F (36.9  C)  TempSrc:      SpO2: 98% 100% 98% 100%  Weight:      Height:       No intake or output data in the 24 hours ending 11/20/23 1110  Filed Weights   11/17/23 0906  Weight: 81.6 kg    Examination:  General exam: NAD Respiratory system: Lungs clear.  Normal work of breathing.  Room air Cardiovascular system: S1-S2, RRR, no murmurs, no pedal edema Gastrointestinal system:, NT/ND, normal bowel sounds Central nervous system: Alert and oriented.  Dysarthric speech Extremities: Symmetric 5 x 5 power. Skin: No rashes, lesions or ulcers Psychiatry: Judgement and insight appear normal. Mood & affect appropriate.     Data Reviewed: I have personally reviewed following labs and imaging studies  CBC: Recent Labs  Lab 11/17/23 0910 11/18/23 0250 11/19/23 0413 11/20/23 0632 11/20/23 0838  WBC 11.7* 10.0 9.3 8.6 9.0  NEUTROABS 9.5*  --   --   --  6.1  HGB 9.2* 8.1* 7.8* 8.0* 8.1*  HCT 27.7* 24.0* 24.0* 24.0* 23.8*  MCV 92.3 90.6 93.0 92.3 91.2  PLT 204 183 184 199 184   Basic Metabolic Panel: Recent Labs  Lab 11/17/23 0910 11/20/23 0838  NA 139 140  K 3.6 3.7  CL 102 101  CO2 29 31  GLUCOSE 122* 109*  BUN 15 13  CREATININE 0.90 0.92  CALCIUM 9.0 9.2   GFR: Estimated Creatinine Clearance: 59.7 mL/min (by C-G formula based on SCr of 0.92 mg/dL). Liver Function Tests: Recent Labs  Lab 11/17/23 0910  AST 42*  ALT 67*  ALKPHOS 49  BILITOT 0.7  PROT 6.9  ALBUMIN 3.2*   Recent Labs  Lab 11/17/23 0910  LIPASE 49   No results for input(s): "AMMONIA" in the last 168 hours. Coagulation Profile: Recent Labs  Lab 11/16/23 0341 11/17/23 0910  INR 1.1 1.1   Cardiac Enzymes: No results for input(s): "CKTOTAL", "CKMB", "CKMBINDEX", "TROPONINI" in the last 168 hours. BNP (last 3 results) No results for input(s): "PROBNP" in the last 8760 hours. HbA1C: No results for input(s): "HGBA1C" in the last 72 hours. CBG: No results for input(s): "GLUCAP" in the last 168  hours.  Lipid Profile: No results for input(s): "CHOL", "HDL", "LDLCALC", "TRIG", "CHOLHDL", "LDLDIRECT" in the last 72 hours. Thyroid Function Tests: No results for input(s): "TSH", "T4TOTAL", "FREET4", "T3FREE", "THYROIDAB" in the last 72 hours. Anemia Panel: Recent Labs    11/19/23 0832 11/20/23 0632  VITAMINB12 7,289*  --   RETICCTPCT  --  5.4*   Sepsis Labs: No results for input(s): "PROCALCITON", "LATICACIDVEN" in the last 168 hours.  Recent Results (from the past 240 hours)  Resp panel by RT-PCR (RSV, Flu A&B, Covid) Anterior Nasal Swab     Status: None   Collection Time: 11/10/23 11:05 PM   Specimen: Anterior Nasal Swab  Result Value Ref Range Status   SARS Coronavirus 2 by RT PCR NEGATIVE NEGATIVE Final    Comment: (NOTE) SARS-CoV-2 target nucleic acids are NOT DETECTED.  The SARS-CoV-2 RNA is generally detectable in upper respiratory specimens during the acute phase of infection. The lowest concentration of SARS-CoV-2 viral copies this assay can detect is 138 copies/mL. A negative result does not preclude SARS-Cov-2 infection and should not be used as the sole basis for treatment or other patient  management decisions. A negative result may occur with  improper specimen collection/handling, submission of specimen other than nasopharyngeal swab, presence of viral mutation(s) within the areas targeted by this assay, and inadequate number of viral copies(<138 copies/mL). A negative result must be combined with clinical observations, patient history, and epidemiological information. The expected result is Negative.  Fact Sheet for Patients:  BloggerCourse.com  Fact Sheet for Healthcare Providers:  SeriousBroker.it  This test is no t yet approved or cleared by the [place] FDA and  has been authorized for detection and/or diagnosis of SARS-CoV-2 by FDA under an Emergency Use Authorization (EUA). This EUA will  remain  in effect (meaning this test can be used) for the duration of the COVID-19 declaration under Section 564(b)(1) of the Act, 21 U.S.C.section 360bbb-3(b)(1), unless the authorization is terminated  or revoked sooner.       Influenza A by PCR NEGATIVE NEGATIVE Final   Influenza B by PCR NEGATIVE NEGATIVE Final    Comment: (NOTE) The Xpert Xpress SARS-CoV-2/FLU/RSV plus assay is intended as an aid in the diagnosis of influenza from Nasopharyngeal swab specimens and should not be used as a sole basis for treatment. Nasal washings and aspirates are unacceptable for Xpert Xpress SARS-CoV-2/FLU/RSV testing.  Fact Sheet for Patients: BloggerCourse.com  Fact Sheet for Healthcare Providers: SeriousBroker.it  This test is not yet approved or cleared by the [place] FDA and has been authorized for detection and/or diagnosis of SARS-CoV-2 by FDA under an Emergency Use Authorization (EUA). This EUA will remain in effect (meaning this test can be used) for the duration of the COVID-19 declaration under Section 564(b)(1) of the Act, 21 U.S.C. section 360bbb-3(b)(1), unless the authorization is terminated or revoked.     Resp Syncytial Virus by PCR NEGATIVE NEGATIVE Final    Comment: (NOTE) Fact Sheet for Patients: BloggerCourse.com  Fact Sheet for Healthcare Providers: SeriousBroker.it  This test is not yet approved or cleared by the [place] FDA and has been authorized for detection and/or diagnosis of SARS-CoV-2 by FDA under an Emergency Use Authorization (EUA). This EUA will remain in effect (meaning this test can be used) for the duration of the COVID-19 declaration under Section 564(b)(1) of the Act, 21 U.S.C. section 360bbb-3(b)(1), unless the authorization is terminated or revoked.  Performed at [facility], [address] [place], [place] [address]    Blood culture (routine x 2)     Status: None   Collection Time: 11/11/23 12:10 AM   Specimen: BLOOD  Result Value Ref Range Status   Specimen Description BLOOD LEFT ANTECUBITAL  Final   Special Requests   Final    BOTTLES DRAWN AEROBIC AND ANAEROBIC Blood Culture adequate volume   Culture   Final    NO GROWTH 5 DAYS Performed at [facility], [address] [place], [place] [address]    Report Status 11/16/2023 FINAL  Final  Blood culture (routine x 2)     Status: None   Collection Time: 11/11/23 12:39 AM   Specimen: BLOOD  Result Value Ref Range Status   Specimen Description BLOOD BLOOD RIGHT HAND  Final   Special Requests   Final    BOTTLES DRAWN AEROBIC AND ANAEROBIC Blood Culture adequate volume   Culture   Final    NO GROWTH 5 DAYS Performed at [facility], [address], [place], [place] [address]    Report Status 11/16/2023 FINAL  Final  MRSA Next Gen by PCR, Nasal  Status: None   Collection Time: 11/12/23  1:40 AM   Specimen: Nasal Mucosa; Nasal Swab  Result Value Ref Range Status   MRSA by PCR Next Gen NOT DETECTED NOT DETECTED Final    Comment: (NOTE) The GeneXpert MRSA Assay (FDA approved for NASAL specimens only), is one component of a comprehensive MRSA colonization surveillance program. It is not intended to diagnose MRSA infection nor to guide or monitor treatment for MRSA infections. Test performance is not FDA approved in patients less than 31 years old. Performed at [facility], [address] [place], [place] [address]          Radiology Studies: [place] CORE BIOPSY (LYMPH NODES) Result Date: 11/19/2023 INDICATION: 68 year old with metastatic adenocarcinoma. Recent biopsy of a right supraclavicular lymph node but additional tissue is needed for diagnostic purposes. EXAM: ULTRASOUND-GUIDED CORE BIOPSY OF RIGHT SUPRACLAVICULAR LYMPH NODE MEDICATIONS: Moderate sedation ANESTHESIA/SEDATION: Moderate (conscious) sedation  was employed during this procedure. A total of Versed 1 mg and Fentanyl 50 mcg was administered intravenously by the radiology nurse. Total intra-service moderate Sedation Time: 15 minutes. The patient's level of consciousness and vital signs were monitored continuously by radiology nursing throughout the procedure under my direct supervision. FLUOROSCOPY TIME:  None COMPLICATIONS: None immediate. PROCEDURE: Informed written consent was obtained from the patient after a thorough discussion of the procedural risks, benefits and alternatives. All questions were addressed. A timeout was performed prior to the initiation of the procedure. Ultrasound was used to identify enlarged right supraclavicular lymph nodes. Right supraclavicular area was prepped with chlorhexidine and sterile field was created. Skin was anesthetized with 1% lidocaine. Small incision was made. Using ultrasound guidance, 17 gauge coaxial needle was directed into a supraclavicular lymph node. Total of 5 core biopsies were obtained with an 18 gauge core device. Specimens placed in formalin. 17 gauge coaxial needle was removed. Bandage placed over the puncture site. FINDINGS: Multiple enlarged right supraclavicular lymph nodes. Core biopsy needle confirmed within the supraclavicular lymph node. No immediate bleeding or hematoma formation. IMPRESSION: Successful ultrasound-guided core biopsy of an enlarged right supraclavicular lymph node. Electronically Signed   By: Richarda Overlie M.D.   On: 11/19/2023 16:16        Scheduled Meds:  enoxaparin (LOVENOX) injection  80 mg Subcutaneous Q12H   feeding supplement  237 mL Oral TID BM   melatonin  10 mg Oral QHS   Continuous Infusions:     LOS: 2 days     Tresa Moore, MD [facility]   If 7PM-7AM, please contact night-coverage  11/20/2023, 11:10 AM

## 2023-11-20 NOTE — TOC Transition Note (Signed)
 Transition of Care [facility]) - Discharge Note   Patient Details  Name: Debra [name] MRN: [id number] Date of Birth: Sep 28, 1955  [facility] CM/SW Contact:  Truddie Hidden, RN Phone Number: 11/20/2023, 2:59 PM   Clinical Narrative:    Spoke with patient regarding therapy's recommendation for outpatient therapy. She is agreeable to outpatient therapy at [facility]  Patient's spouse will transport her home.   TOC signing off.           Patient Goals and CMS Choice            Discharge Placement                       Discharge Plan and Services Additional resources added to the After Visit Summary for                                       Social Drivers of Health (SDOH) Interventions SDOH Screenings   Food Insecurity: No Food Insecurity (11/18/2023)  Housing: Low Risk  (11/18/2023)  Transportation Needs: No Transportation Needs (11/18/2023)  Utilities: Not At Risk (11/18/2023)  Financial Resource Strain: Low Risk  (06/15/2023)   Received from [facility]  Physical Activity: Unknown (12/17/2017)  Social Connections: Moderately Isolated (11/18/2023)  Stress: No Stress Concern Present (12/17/2017)  Tobacco Use: Medium Risk (11/18/2023)     Readmission Risk Interventions     No data to display

## 2023-11-26 ENCOUNTER — Ambulatory Visit: Admitting: Oncology

## 2023-11-26 ENCOUNTER — Inpatient Hospital Stay: Admitting: Oncology

## 2023-11-26 ENCOUNTER — Encounter: Payer: Self-pay | Admitting: *Deleted

## 2023-11-26 NOTE — Progress Notes (Unsigned)
 Called pathology to check on HER2 status.   They are checking with the pathologist and will call back with an update.

## 2023-11-27 ENCOUNTER — Telehealth: Payer: Self-pay

## 2023-11-27 ENCOUNTER — Encounter: Payer: Self-pay | Admitting: Oncology

## 2023-11-27 NOTE — Telephone Encounter (Signed)
 Yes, please. MD next week.

## 2023-11-27 NOTE — Telephone Encounter (Signed)
 Pt r/s to 4/22, but Dr. Cathie Hoops would like to see her next week to discuss pathology results and plan of care.   Please move up appt to next week and notify pt of new appt details,   MD/ Palliative

## 2023-11-30 ENCOUNTER — Encounter: Payer: Self-pay | Admitting: Oncology

## 2023-12-08 ENCOUNTER — Encounter: Payer: Self-pay | Admitting: Oncology

## 2023-12-08 ENCOUNTER — Inpatient Hospital Stay: Admitting: Hospice and Palliative Medicine

## 2023-12-08 ENCOUNTER — Inpatient Hospital Stay: Attending: Oncology | Admitting: Oncology

## 2023-12-08 ENCOUNTER — Inpatient Hospital Stay

## 2023-12-08 ENCOUNTER — Telehealth: Payer: Self-pay

## 2023-12-08 VITALS — BP 108/70 | HR 86 | Temp 97.5°F | Resp 18 | Wt 177.1 lb

## 2023-12-08 DIAGNOSIS — C50919 Malignant neoplasm of unspecified site of unspecified female breast: Secondary | ICD-10-CM | POA: Diagnosis not present

## 2023-12-08 DIAGNOSIS — Z87891 Personal history of nicotine dependence: Secondary | ICD-10-CM | POA: Diagnosis not present

## 2023-12-08 DIAGNOSIS — Z7189 Other specified counseling: Secondary | ICD-10-CM | POA: Diagnosis not present

## 2023-12-08 DIAGNOSIS — D649 Anemia, unspecified: Secondary | ICD-10-CM | POA: Insufficient documentation

## 2023-12-08 DIAGNOSIS — C50211 Malignant neoplasm of upper-inner quadrant of right female breast: Secondary | ICD-10-CM | POA: Diagnosis present

## 2023-12-08 DIAGNOSIS — E876 Hypokalemia: Secondary | ICD-10-CM | POA: Insufficient documentation

## 2023-12-08 DIAGNOSIS — C787 Secondary malignant neoplasm of liver and intrahepatic bile duct: Secondary | ICD-10-CM | POA: Insufficient documentation

## 2023-12-08 DIAGNOSIS — C7951 Secondary malignant neoplasm of bone: Secondary | ICD-10-CM | POA: Insufficient documentation

## 2023-12-08 DIAGNOSIS — Z170 Estrogen receptor positive status [ER+]: Secondary | ICD-10-CM | POA: Diagnosis present

## 2023-12-08 DIAGNOSIS — I480 Paroxysmal atrial fibrillation: Secondary | ICD-10-CM | POA: Diagnosis not present

## 2023-12-08 DIAGNOSIS — C7800 Secondary malignant neoplasm of unspecified lung: Secondary | ICD-10-CM | POA: Insufficient documentation

## 2023-12-08 DIAGNOSIS — Z8673 Personal history of transient ischemic attack (TIA), and cerebral infarction without residual deficits: Secondary | ICD-10-CM | POA: Insufficient documentation

## 2023-12-08 DIAGNOSIS — R110 Nausea: Secondary | ICD-10-CM | POA: Insufficient documentation

## 2023-12-08 DIAGNOSIS — C770 Secondary and unspecified malignant neoplasm of lymph nodes of head, face and neck: Secondary | ICD-10-CM | POA: Insufficient documentation

## 2023-12-08 DIAGNOSIS — I6310 Cerebral infarction due to embolism of unspecified precerebral artery: Secondary | ICD-10-CM

## 2023-12-08 LAB — RETIC PANEL
Immature Retic Fract: 29.5 % — ABNORMAL HIGH (ref 2.3–15.9)
RBC.: 2.94 MIL/uL — ABNORMAL LOW (ref 3.87–5.11)
Retic Count, Absolute: 155.5 10*3/uL (ref 19.0–186.0)
Retic Ct Pct: 5.3 % — ABNORMAL HIGH (ref 0.4–3.1)
Reticulocyte Hemoglobin: 32.1 pg (ref >27.9)

## 2023-12-08 LAB — CBC WITH DIFFERENTIAL (CANCER CENTER ONLY)
Abs Immature Granulocytes: 0.21 10*3/uL — ABNORMAL HIGH (ref 0.00–0.07)
Basophils Absolute: 0.1 10*3/uL (ref 0.0–0.1)
Basophils Relative: 1 %
Eosinophils Absolute: 0.3 10*3/uL (ref 0.0–0.5)
Eosinophils Relative: 2 %
HCT: 26.9 % — ABNORMAL LOW (ref 36.0–46.0)
Hemoglobin: 8.8 g/dL — ABNORMAL LOW (ref 12.0–15.0)
Immature Granulocytes: 2 %
Lymphocytes Relative: 15 %
Lymphs Abs: 2.1 10*3/uL (ref 0.7–4.0)
MCH: 30.1 pg (ref 26.0–34.0)
MCHC: 32.7 g/dL (ref 30.0–36.0)
MCV: 92.1 fL (ref 80.0–100.0)
Monocytes Absolute: 1.1 10*3/uL — ABNORMAL HIGH (ref 0.1–1.0)
Monocytes Relative: 8 %
Neutro Abs: 10.4 10*3/uL — ABNORMAL HIGH (ref 1.7–7.7)
Neutrophils Relative %: 72 %
Platelet Count: 331 10*3/uL (ref 150–400)
RBC: 2.92 MIL/uL — ABNORMAL LOW (ref 3.87–5.11)
RDW: 16 % — ABNORMAL HIGH (ref 11.5–15.5)
WBC Count: 14.1 10*3/uL — ABNORMAL HIGH (ref 4.0–10.5)
nRBC: 0.2 % (ref 0.0–0.2)

## 2023-12-08 LAB — IRON AND TIBC
Iron: 58 ug/dL (ref 28–170)
Saturation Ratios: 21 % (ref 10.4–31.8)
TIBC: 276 ug/dL (ref 250–450)
UIBC: 218 ug/dL

## 2023-12-08 LAB — CMP (CANCER CENTER ONLY)
ALT: 46 U/L — ABNORMAL HIGH (ref 0–44)
AST: 35 U/L (ref 15–41)
Albumin: 3.3 g/dL — ABNORMAL LOW (ref 3.5–5.0)
Alkaline Phosphatase: 78 U/L (ref 38–126)
Anion gap: 14 (ref 5–15)
BUN: 16 mg/dL (ref 8–23)
CO2: 26 mmol/L (ref 22–32)
Calcium: 9.2 mg/dL (ref 8.9–10.3)
Chloride: 95 mmol/L — ABNORMAL LOW (ref 98–111)
Creatinine: 0.98 mg/dL (ref 0.44–1.00)
GFR, Estimated: >60 mL/min (ref >60)
Glucose, Bld: 140 mg/dL — ABNORMAL HIGH (ref 70–99)
Potassium: 2.9 mmol/L — ABNORMAL LOW (ref 3.5–5.1)
Sodium: 135 mmol/L (ref 135–145)
Total Bilirubin: 0.8 mg/dL (ref 0.0–1.2)
Total Protein: 7.1 g/dL (ref 6.5–8.1)

## 2023-12-08 LAB — FERRITIN: Ferritin: 556 ng/mL — ABNORMAL HIGH (ref 11–307)

## 2023-12-08 MED ORDER — ONDANSETRON 4 MG PO TBDP: 4.0000 mg | ORAL_TABLET | Freq: Four times a day (QID) | ORAL | 1 refills | Status: DC | PRN

## 2023-12-08 MED ORDER — POTASSIUM CHLORIDE CRYS ER 20 MEQ PO TBCR: 20.0000 meq | EXTENDED_RELEASE_TABLET | Freq: Two times a day (BID) | ORAL | 0 refills | Status: DC

## 2023-12-08 MED ORDER — ENOXAPARIN SODIUM 80 MG/0.8ML IJ SOSY: 80.0000 mg | PREFILLED_SYRINGE | Freq: Two times a day (BID) | INTRAMUSCULAR | 1 refills | Status: DC

## 2023-12-08 NOTE — Progress Notes (Signed)
 DISCONTINUE ON PATHWAY REGIMEN - Breast     A cycle is every 21 days:     Ado-trastuzumab emtansine    **Always confirm dose/schedule in your pharmacy ordering system**  PRIOR TREATMENT: [id number]: Ado-trastuzumab Emtansine  3.6 mg/kg q21 Days x 14 Cycles  START ON PATHWAY REGIMEN - Breast     A cycle is every 28 days (3 weeks on and 1 week off):     Paclitaxel   **Always confirm dose/schedule in your pharmacy ordering system**  Patient Characteristics: Distant Metastases or Locoregional Recurrent Disease - Unresected, M0 or Locally Advanced Unresectable Disease Progressing after Neoadjuvant and Local Therapies, M0, HER2 Low/Negative, ER Negative, Chemotherapy, HER2 Negative, First Line, PD-L1  Expression Negative/Unknown or Not a Candidate for Immunotherapy, and gBRCA Wildtype Therapeutic Status: Distant Metastases HER2 Status: Negative (-) ER Status: Negative (-) PR Status: Negative (-) Therapy Approach Indicated: Standard Chemotherapy/Endocrine Therapy Line of Therapy: First Line Intent of Therapy: Non-Curative / Palliative Intent, Not Discussed with Patient

## 2023-12-08 NOTE — Assessment & Plan Note (Signed)
 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 Also possible marrow involvement.  Hemoglobin stable.  Monitor

## 2023-12-08 NOTE — Assessment & Plan Note (Signed)
 Per patient request to switch to ondansetron  disintegrating tablets 8 mg every 8 hours as needed.

## 2023-12-08 NOTE — Addendum Note (Signed)
 Addended by: Timmy Forbes on: 12/08/2023 07:54 PM   Modules accepted: Orders

## 2023-12-08 NOTE — Assessment & Plan Note (Signed)
 Recommend patient to start potassium chloride  20 mEq twice daily.

## 2023-12-08 NOTE — Progress Notes (Addendum)
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 nodes. Core biopsy needle confirmed within the supraclavicular lymph node. No immediate bleeding or hematoma formation. IMPRESSION: Successful ultrasound-guided core biopsy of an enlarged right supraclavicular lymph node. Electronically Signed   By: Elene Griffes M.D.   On: 11/19/2023 16:16   ECHOCARDIOGRAM LIMITED Result Date: 11/18/2023    ECHOCARDIOGRAM LIMITED REPORT   Patient Name:   Debra [name] Date of Exam: 11/17/2023 Medical Rec #:  [id number]    Height:       64.0 in Accession #:    [id number]   Weight:       179.9 lb Date of Birth:  29-Jan-1956     BSA:          1.870 m Patient Age:    41 years     BP:           114/88 mmHg Patient Gender: F            HR:           84 bpm. Exam Location:  [facility] Procedure: 2D Echo, Limited Echo, Limited Color Doppler and Cardiac Doppler            (Both Spectral and Color Flow Doppler were utilized during            procedure). Indications:     Elevated  Troponin  History:         Patient has prior history of Echocardiogram examinations, most                  recent 11/11/2023. COPD, Arrythmias:Atrial Fibrillation; Risk                  Factors:Hypertension and Dyslipidemia. Obstructive sleep apnea.                  Fatigue. Breast Cancer.  Sonographer:     Brigid Canada RDCS Referring Phys:  [id number] RYAN M DUNN Diagnosing Phys: Sammy Crisp MD IMPRESSIONS  1. Left ventricular ejection fraction, by estimation, is 60 to 65%. The left ventricle has normal function. The left ventricle has no regional wall motion abnormalities. Left ventricular diastolic parameters were normal.  2. Right ventricular systolic function is normal. The right ventricular size is normal. Tricuspid regurgitation signal is inadequate for assessing PA pressure.  3. The mitral valve is degenerative. At least moderate mitral valve regurgitation, which may be underestimated due to jet eccentricity. No evidence of mitral stenosis.  4. Aortic valve regurgitation is not visualized. No aortic stenosis is present.  5. The inferior vena cava is normal in size with greater than 50% respiratory variability, suggesting right atrial pressure of 3 mmHg. FINDINGS  Left Ventricle: Left ventricular ejection fraction, by estimation, is 60 to 65%. The left ventricle has normal function. The left ventricle has no regional wall motion abnormalities. The left ventricular internal cavity size was normal in size. There is  no left ventricular hypertrophy. Left ventricular diastolic parameters were normal. Right Ventricle: The right ventricular size is normal. No increase in right ventricular wall thickness. Right ventricular systolic function is normal. Tricuspid regurgitation signal is inadequate for assessing PA pressure. Pericardium: There is no evidence of pericardial effusion. Mitral Valve: The mitral valve is degenerative in appearance. There is moderate thickening of the mitral valve leaflet(s).  Moderate mitral valve regurgitation, with wall-impinging jet. No evidence of mitral valve stenosis. Tricuspid Valve: The tricuspid valve is normal in structure. Tricuspid valve regurgitation is trivial. Aortic Valve: Aortic valve regurgitation is not visualized. No aortic stenosis is present. Aortic valve mean gradient measures 6.0 mmHg. Aortic valve peak gradient measures 13.5 mmHg. Aorta: The aortic root is normal in size and structure. Venous: The inferior vena cava is normal in size with greater than 50% respiratory variability, suggesting right atrial pressure of 3 mmHg. LEFT VENTRICLE PLAX 2D LVIDd:         5.20 cm Diastology LVIDs:         2.80 cm LV e' medial:    12.35 cm/s LV PW:         0.70 cm LV E/e' medial:  8.9 LV IVS:        0.70 cm LV e' lateral:   15.75 cm/s                        LV E/e' lateral: 7.0  RIGHT VENTRICLE             IVC RV S prime:     19.85 cm/s  IVC diam: 1.60 cm LEFT ATRIUM         Index LA diam:    3.70 cm 1.98 cm/m  AORTIC VALVE AV Vmax:      183.59 cm/s AV Vmean:     114.120 cm/s AV VTI:       0.317 m AV Peak Grad: 13.5 mmHg AV Mean Grad: 6.0 mmHg  AORTA Ao Root diam: 3.40 cm MITRAL VALVE MV Area (PHT): 3.63 cm MV VTI:        0.40 m MV Decel Time: 209 msec MV E velocity: 109.67 cm/s MV A velocity: 142.67 cm/s MV E/A ratio:  0.77 Veryl Gottron End MD Electronically signed by Sammy Crisp MD Signature Date/Time: 11/18/2023/5:49:00 AM    Final    MR BRAIN WO CONTRAST Result Date: 11/17/2023 CLINICAL DATA:  Provided history: Neuro deficit, acute, stroke suspected. Possible new versus progressed CVA on CTA. Weakness. Aphasia. Facial droop. EXAM: MRI HEAD WITHOUT CONTRAST TECHNIQUE: Multiplanar, multiecho pulse sequences of the brain and surrounding structures were obtained without intravenous contrast. COMPARISON:  Head CT 11/17/2023. CT angiogram head/neck 11/11/2023. Brain MRI 11/11/2023. FINDINGS: Brain: No age-advanced or lobar predominant cerebral atrophy. Numerous small acute  infarcts within the left cerebellar hemisphere and left superior cerebellar peduncle, the majority of which are new from the prior MRI of 11/11/2023. Several new small acute cortical infarcts are present within the right frontal, right parietal, left parietal and left occipital lobes. A new punctate acute infarct is also present within the left thalamus. Known patchy acute/early subacute infarcts elsewhere within the left frontal and left parietal lobes (MCA vascular territory). 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  Background mild multifocal T2 FLAIR hyperintense signal abnormality within the cerebral white matter, nonspecific but compatible with chronic small vessel ischemic disease. Punctate chronic microhemorrhage within the left occipital lobe, unchanged (series 10, image 25). No evidence of an intracranial mass. No extra-axial fluid collection. No midline shift. Vascular: Please see the recent prior CTA head/neck 11/11/2023. Small foci of susceptibility-weighted signal loss along the left frontal lobe, new from the prior MRI and suspicious for emboli within distal left MCA branches (series 10, image 46). Skull and upper cervical spine: No focal worrisome marrow lesion. Incompletely assessed cervical spondylosis. Sinuses/Orbits: No mass or acute finding within the imaged orbits. Prior bilateral ocular lens replacement. Minimal mucosal thickening within the right maxillary sinus. IMPRESSION: 1. Numerous small acute infarcts within the left cerebellar hemisphere/superior cerebellar peduncle, the majority of which are new from the prior brain MRI of 11/11/2023. Additionally, there are several new small acute cortical infarcts within the right frontal, right parietal, left parietal and left occipital lobes, as well as a new punctate acute infarct within the left thalamus. Involvement of multiple vascular territories concerning for an embolic process. New small foci of signal  abnormality along the left frontal lobe suspicious for emboli within distal left middle cerebral artery branches. 2. Known patchy acute/early subacute infarcts elsewhere within the left frontal and left parietal lobes (MCA vascular territory). 3. Background mild cerebral white matter chronic small vessel ischemic disease. 4. Unchanged chronic lacunar infarct within the right caudate nucleus. Electronically Signed   By: Bascom Lily D.O.   On: 11/17/2023 16:31   DG Chest Portable 1 View Result Date: 11/17/2023 CLINICAL DATA:  Vomiting.  Weakness. EXAM: PORTABLE CHEST 1 VIEW COMPARISON:  11/10/2023 FINDINGS: Bilateral lung fields are clear. Bilateral costophrenic angles are clear. Stable cardio-mediastinal silhouette. No acute osseous abnormalities. The soft tissues are within normal limits. IMPRESSION: No active disease. Electronically Signed   By: Beula Brunswick M.D.   On: 11/17/2023 11:49   CT Head Wo Contrast Result Date: 11/17/2023 CLINICAL DATA:  68 year old female neurologic deficit with slurred speech, right facial droop. Recent left MCA M2 occlusion, posterior left MCA infarcts, occasional other small scattered brain infarcts on CTA, MRI. EXAM: CT HEAD WITHOUT CONTRAST TECHNIQUE: Contiguous axial images were obtained from the base of the skull through the vertex without intravenous contrast. RADIATION DOSE REDUCTION: This exam was performed according to the departmental dose-optimization program which includes automated exposure control, adjustment of the mA and/or kV according to patient size and/or use of iterative reconstruction technique. COMPARISON:  Brain MRI 11/11/2023 and earlier FINDINGS: Brain: Posterior left MCA infarcts on MRI remain subtle by CT with no associated hemorrhage or mass effect (series 3, image 20). Small left cerebellum PICA territory infarct is now apparent on CT coronal image 52, and appears different from that on the recent MRI. No associated hemorrhage or mass effect. No  acute intracranial hemorrhage identified. No midline shift, mass effect, or evidence of intracranial mass lesion. No ventriculomegaly. Vascular: Calcified atherosclerosis at the skull base. No suspicious intracranial vascular hyperdensity. Skull: Stable and intact. Sinuses/Orbits: Visualized paranasal sinuses and mastoids are stable and well aerated. Other: Mild rightward gaze. No other acute orbit or scalp soft tissue finding. IMPRESSION: 1. New or progressed Left cerebellar PICA territory infarct since the MRI on 11/11/2023. 2. Stable CT appearance of posterior left MCA territory infarcts. 3. No associated acute intracranial hemorrhage or mass effect. Electronically Signed   By: Marlise Simpers M.D.   On: 11/17/2023 10:15   US   CORE BIOPSY (LYMPH NODES) Result Date: 11/16/2023 INDICATION: Right lower lobe lung mass, lymphadenopathy and liver lesions. Enlarged right supraclavicular lymph node targeted for biopsy. EXAM: ULTRASOUND GUIDED CORE BIOPSY OF RIGHT SUPRACLAVICULAR LYMPH NODE MEDICATIONS: None. ANESTHESIA/SEDATION: Moderate (conscious) sedation was employed during this procedure. A total of Versed  1.5 mg and Fentanyl  75 mcg was administered intravenously. Moderate Sedation Time: 19 minutes. The patient's level of consciousness and vital signs were monitored continuously by radiology nursing throughout the procedure under my direct supervision. PROCEDURE: The procedure, risks, benefits, and alternatives were explained to the patient. Questions regarding the procedure were encouraged and answered. The patient understands and consents to the procedure. A time-out was performed prior to initiating the procedure. The right neck was prepped with chlorhexidine  in a sterile fashion, and a sterile drape was applied covering the operative field. A sterile gown and sterile gloves were used for the procedure. Local anesthesia was provided with 1% Lidocaine . After localizing an enlarged right supraclavicular lymph node, 18  gauge core biopsy samples were obtained through different portions of the lymph node. A total of 5 samples were obtained with 3 submitted on saline soaked Telfa gauze and 2 in formalin. Additional ultrasound was performed after biopsy. COMPLICATIONS: None immediate. FINDINGS: Several adjacent right supraclavicular lymph nodes are identified by ultrasound. The largest measures approximately 2.2 x 1.4 x 1.7 cm. Solid core biopsy samples were obtained. IMPRESSION: Ultrasound-guided core biopsy performed of an enlarged right supraclavicular lymph node measuring 2.2 cm in greatest diameter by ultrasound. Electronically Signed   By: Erica Hau M.D.   On: 11/16/2023 16:02   ECHOCARDIOGRAM COMPLETE Result Date: 11/12/2023    ECHOCARDIOGRAM REPORT   Patient Name:   Debra [name] Date of Exam: 11/11/2023 Medical Rec #:  [id number]    Height:       64.0 in Accession #:    [id number]   Weight:       180.0 lb Date of Birth:  Feb 02, 1956     BSA:          1.871 m Patient Age:    60 years     BP:           110/92 mmHg Patient Gender: F            HR:           80 bpm. Exam Location:  [facility] Procedure: 2D Echo, Cardiac Doppler and Color Doppler (Both Spectral and Color            Flow Doppler were utilized during procedure). Indications:     Elevated Troponin  History:         Patient has prior history of Echocardiogram examinations, most                  recent 04/25/2022. COPD, Arrythmias:Atrial Fibrillation; Risk                  Factors:Hypertension and Dyslipidemia. Obstructive sleep apnea.  Sonographer:     Brigid Canada RDCS Referring Phys:  [id number] Haynes Lips EXBM Diagnosing Phys: Belva Boyden MD IMPRESSIONS  1. Left ventricular ejection fraction, by estimation, is 60 to 65%. The left ventricle has normal function. The left ventricle has no regional wall motion abnormalities. There is mild left ventricular hypertrophy. Left ventricular diastolic parameters are consistent with Grade I diastolic dysfunction  (impaired relaxation).  2. Right ventricular systolic function is normal. The right ventricular size is normal. There is normal pulmonary artery systolic pressure. The estimated  right ventricular systolic pressure is 21.3 mmHg.  3. The mitral valve is normal in structure. Mild to moderate mitral valve regurgitation. No evidence of mitral stenosis.  4. The aortic valve is normal in structure. Aortic valve regurgitation is not visualized. No aortic stenosis is present.  5. The inferior vena cava is normal in size with greater than 50% respiratory variability, suggesting right atrial pressure of 3 mmHg. FINDINGS  Left Ventricle: Left ventricular ejection fraction, by estimation, is 60 to 65%. The left ventricle has normal function. The left ventricle has no regional wall motion abnormalities. Strain was performed and the global longitudinal strain is indeterminate. The left ventricular internal cavity size was normal in size. There is mild left ventricular hypertrophy. Left ventricular diastolic parameters are consistent with Grade I diastolic dysfunction (impaired relaxation). Right Ventricle: The right ventricular size is normal. No increase in right ventricular wall thickness. Right ventricular systolic function is normal. There is normal pulmonary artery systolic pressure. The tricuspid regurgitant velocity is 2.02 m/s, and  with an assumed right atrial pressure of 5 mmHg, the estimated right ventricular systolic pressure is 21.3 mmHg. Left Atrium: Left atrial size was normal in size. Right Atrium: Right atrial size was normal in size. Pericardium: There is no evidence of pericardial effusion. Mitral Valve: The mitral valve is normal in structure. Mild to moderate mitral valve regurgitation. No evidence of mitral valve stenosis. Tricuspid Valve: The tricuspid valve is normal in structure. Tricuspid valve regurgitation is not demonstrated. No evidence of tricuspid stenosis. Aortic Valve: The aortic valve is normal  in structure. Aortic valve regurgitation is not visualized. No aortic stenosis is present. Pulmonic Valve: The pulmonic valve was normal in structure. Pulmonic valve regurgitation is not visualized. No evidence of pulmonic stenosis. Aorta: The aortic root is normal in size and structure. Venous: The inferior vena cava is normal in size with greater than 50% respiratory variability, suggesting right atrial pressure of 3 mmHg. IAS/Shunts: No atrial level shunt detected by color flow Doppler. Additional Comments: 3D was performed not requiring image post processing on an independent workstation and was indeterminate.  LEFT VENTRICLE PLAX 2D LVIDd:         4.90 cm   Diastology LVIDs:         3.10 cm   LV e' medial:    9.36 cm/s LV PW:         1.00 cm   LV E/e' medial:  9.1 LV IVS:        1.00 cm   LV e' lateral:   9.32 cm/s LVOT diam:     2.00 cm   LV E/e' lateral: 9.2 LV SV:         56 LV SV Index:   30 LVOT Area:     3.14 cm  RIGHT VENTRICLE RV Basal diam:  3.30 cm RV S prime:     18.33 cm/s TAPSE (M-mode): 3.2 cm LEFT ATRIUM             Index        RIGHT ATRIUM           Index LA diam:        3.90 cm 2.08 cm/m   RA Area:     11.60 cm LA Vol (A2C):   50.9 ml 27.21 ml/m  RA Volume:   27.60 ml  14.75 ml/m LA Vol (A4C):   38.2 ml 20.42 ml/m LA Biplane Vol: 44.4 ml 23.73 ml/m  AORTIC VALVE LVOT Vmax:  98.00 cm/s LVOT Vmean:  64.000 cm/s LVOT VTI:    0.179 m  AORTA Ao Root diam: 3.30 cm MITRAL VALVE                TRICUSPID VALVE MV Area (PHT): 3.65 cm     TR Peak grad:   16.3 mmHg MV Decel Time: 208 msec     TR Vmax:        202.00 cm/s MR Peak grad: 82.4 mmHg MR Vmax:      454.00 cm/s   SHUNTS MV E velocity: 85.55 cm/s   Systemic VTI:  0.18 m MV A velocity: 123.50 cm/s  Systemic Diam: 2.00 cm MV E/A ratio:  0.69 Belva Boyden MD Electronically signed by Belva Boyden MD Signature Date/Time: 11/12/2023/1:59:01 PM    Final    MR ABDOMEN MRCP W WO CONTAST Result Date: 11/12/2023 CLINICAL DATA:  Evaluate liver  lesions.  Chest mass. EXAM: MRI ABDOMEN WITHOUT AND WITH CONTRAST (INCLUDING MRCP) TECHNIQUE: Multiplanar multisequence MR imaging of the abdomen was performed both before and after the administration of intravenous contrast. Heavily T2-weighted images of the biliary and pancreatic ducts were obtained, and three-dimensional MRCP images were rendered by post processing. CONTRAST:  8mL GADAVIST  GADOBUTROL  1 MMOL/ML IV SOLN COMPARISON:  CT AP 11/10/2023 FINDINGS: Lower chest: Small right pleural effusion. Partially visualized mass within the superior segment of right lower lobe noted. Hepatobiliary: There are multifocal bilobar, ill-defined areas of mild increased T2 signal noted within both lobes of liver which show hypoenhancement on the postcontrast images and restricted diffusion. At least 5 lesions are noted involving both lobes. The largest lesion is in segment 5 measuring 1.1 cm, image 17/8. Lesion in segment 2/3 measures 6 mm, image 10/8. Status post cholecystectomy. Mild intrahepatic bile duct dilatation. Fusiform dilatation of the common bile duct which measures 1.3 cm proximally. No signs of choledocholithiasis or mass. Pancreas:  No main duct dilatation, inflammation or mass identified. Spleen: Within the proximal portion of the spleen there are 2 wedge-shaped areas of peripheral hypoenhancement, image 25/31. Adrenals/Urinary Tract: There is a 1.3 cm nodule in the left adrenal gland which is indeterminate with only mild loss of signal on the out of phase sequences, image 29/9. This is a new finding when compared with the remote CT of the abdomen pelvis from 09/17/2020. Normal right adrenal gland. No kidney mass or signs of obstructive uropathy. Stomach/Bowel: Visualized portions within the abdomen are unremarkable. Vascular/Lymphatic: No aneurysm. Aortic atherosclerosis. No adenopathy. Other:  No ascites. Musculoskeletal: Multifocal abnormal areas of enhancement are identified throughout the visualized  portions of the thoracic and lumbar spine which are concerning for osseous metastasis. IMPRESSION: 1. There are multifocal bilobar, ill-defined areas of mild increased T2 signal noted within both lobes of liver which show hypoenhancement on the postcontrast images and restricted diffusion. At least 5 lesions are noted involving both lobes. 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 2. Multifocal abnormal areas of enhancement are identified throughout the visualized portions of the thoracic and lumbar spine which are concerning for osseous metastasis. 3. There are 2 wedge-shaped areas of peripheral hypoenhancement within the proximal portion of the spleen which are concerning for splenic infarcts. 4. There is a 1.3 cm nodule in the left adrenal gland which is indeterminate with only mild loss of signal on the out of phase sequences. This is a new finding when compared with the remote CT of the abdomen pelvis from 09/17/2020. Differential considerations include lipid poor adenoma versus a adrenal metastasis. 5.  Small right pleural effusion. 6. Partially visualized mass within the superior segment of right lower lobe. 7. Status post cholecystectomy. Mild intrahepatic bile duct dilatation. Fusiform dilatation of the common bile duct which measures 1.3 cm proximally. No signs of choledocholithiasis or mass. Electronically Signed   By: Kimberley Penman M.D.   On: 11/12/2023 05:07   MR 3D Recon At Scanner Result Date: 11/12/2023 CLINICAL DATA:  Evaluate liver lesions.  Chest mass. EXAM: MRI ABDOMEN WITHOUT AND WITH CONTRAST (INCLUDING MRCP) TECHNIQUE: Multiplanar multisequence MR imaging of the abdomen was performed both before and after the administration of intravenous contrast. Heavily T2-weighted images of the biliary and pancreatic ducts were obtained, and three-dimensional MRCP images were rendered by post processing. CONTRAST:  8mL GADAVIST  GADOBUTROL  1 MMOL/ML IV SOLN COMPARISON:  CT AP 11/10/2023 FINDINGS:  Lower chest: Small right pleural effusion. Partially visualized mass within the superior segment of right lower lobe noted. Hepatobiliary: There are multifocal bilobar, ill-defined areas of mild increased T2 signal noted within both lobes of liver which show hypoenhancement on the postcontrast images and restricted diffusion. At least 5 lesions are noted involving both lobes. The largest lesion is in segment 5 measuring 1.1 cm, image 17/8. Lesion in segment 2/3 measures 6 mm, image 10/8. Status post cholecystectomy. Mild intrahepatic bile duct dilatation. Fusiform dilatation of the common bile duct which measures 1.3 cm proximally. No signs of choledocholithiasis or mass. Pancreas:  No main duct dilatation, inflammation or mass identified. Spleen: Within the proximal portion of the spleen there are 2 wedge-shaped areas of peripheral hypoenhancement, image 25/31. Adrenals/Urinary Tract: There is a 1.3 cm nodule in the left adrenal gland which is indeterminate with only mild loss of signal on the out of phase sequences, image 29/9. This is a new finding when compared with the remote CT of the abdomen pelvis from 09/17/2020. Normal right adrenal gland. No kidney mass or signs of obstructive uropathy. Stomach/Bowel: Visualized portions within the abdomen are unremarkable. Vascular/Lymphatic: No aneurysm. Aortic atherosclerosis. No adenopathy. Other:  No ascites. Musculoskeletal: Multifocal abnormal areas of enhancement are identified throughout the visualized portions of the thoracic and lumbar spine which are concerning for osseous metastasis. IMPRESSION: 1. There are multifocal bilobar, ill-defined areas of mild increased T2 signal noted within both lobes of liver which show hypoenhancement on the postcontrast images and restricted diffusion. At least 5 lesions are noted involving both lobes. 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 2. Multifocal abnormal areas of enhancement are identified throughout the  visualized portions of the thoracic and lumbar spine which are concerning for osseous metastasis. 3. There are 2 wedge-shaped areas of peripheral hypoenhancement within the proximal portion of the spleen which are concerning for splenic infarcts. 4. There is a 1.3 cm nodule in the left adrenal gland which is indeterminate with only mild loss of signal on the out of phase sequences. This is a new finding when compared with the remote CT of the abdomen pelvis from 09/17/2020. Differential considerations include lipid poor adenoma versus a adrenal metastasis. 5. Small right pleural effusion. 6. Partially visualized mass within the superior segment of right lower lobe. 7. Status post cholecystectomy. Mild intrahepatic bile duct dilatation. Fusiform dilatation of the common bile duct which measures 1.3 cm proximally. No signs of choledocholithiasis or mass. Electronically Signed   By: Kimberley Penman M.D.   On: 11/12/2023 05:07   CT ANGIO HEAD NECK W WO CM Addendum Date: 11/11/2023 ADDENDUM REPORT: 11/11/2023 23:37 ADDENDUM: Findings discussed with Brenda Morisson, NP via telephone  at 10:33 p.m. Electronically Signed   By: Stevenson Elbe M.D.   On: 11/11/2023 23:37   Result Date: 11/11/2023 CLINICAL DATA:  Stroke/TIA, determine embolic source EXAM: CT ANGIOGRAPHY HEAD AND NECK WITH AND WITHOUT CONTRAST TECHNIQUE: Multidetector CT imaging of the head and neck was performed using the standard protocol during bolus administration of intravenous contrast. Multiplanar CT image reconstructions and MIPs were obtained to evaluate the vascular anatomy. Carotid stenosis measurements (when applicable) are obtained utilizing NASCET criteria, using the distal internal carotid diameter as the denominator. RADIATION DOSE REDUCTION: This exam was performed according to the departmental dose-optimization program which includes automated exposure control, adjustment of the mA and/or kV according to patient size and/or use of  iterative reconstruction technique. CONTRAST:  75mL OMNIPAQUE  IOHEXOL  350 MG/ML SOLN COMPARISON:  Same day MRI head. FINDINGS: CTA NECK FINDINGS Aortic arch: Aortic atherosclerosis. 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 Great vessel origins are patent. Right carotid system: Atherosclerosis at the carotid bifurcation without greater than 50% stenosis. Left carotid system: Atherosclerosis at the carotid bifurcation without greater than 50% stenosis. Vertebral arteries: Codominant. No evidence of dissection, stenosis (50% or greater), or occlusion. Skeleton: No evidence of acute abnormality on limited assessment. Multilevel degenerative change. Other neck: Approximately 2.3 cm left thyroid  nodule. Upper chest: Please see same day CT chest for intrathoracic findings. Emphysema. Review of the MIP images confirms the above findings CTA HEAD FINDINGS Anterior circulation: Bilateral intracranial ICAs are patent. Approximately 2 mm medially directed outpouching arising from the right cavernous ICA, which could represent an infundibulum or aneurysm. Bilateral M1 MCAs are patent. 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 Bilateral ACAs are patent without proximal hemodynamically significant stenosis. Posterior circulation: Bilateral intradural vertebral arteries, basilar artery and bilateral posterior cerebral arteries are patent without proximal hemodynamically significant stenosis. Venous sinuses: As permitted by contrast timing, patent. Review of the MIP images confirms the above findings The ordering provider has been paged at the time of dictation for call of report. IMPRESSION: 1. 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 2. Approximately 2 mm medially directed outpouching arising from the right cavernous ICA, which could represent an infundibulum or aneurysm. 3. Please see same day CT chest for intrathoracic findings. 4. Approximately 2.3  cm left thyroid  nodule. Recommend thyroid  US  (ref: J Am Coll Radiol. 2015 Feb;12(2): 143-50). 5. Aortic Atherosclerosis (ICD10-I70.0) and Emphysema (ICD10-J43.9). Electronically Signed: By: Stevenson Elbe M.D. On: 11/11/2023 23:14   MR BRAIN W WO CONTRAST Result Date: 11/11/2023 CLINICAL DATA:  right facial weakness, malignancy also on the ddx EXAM: MRI HEAD WITHOUT AND WITH CONTRAST TECHNIQUE: Multiplanar, multiecho pulse sequences of the brain and surrounding structures were obtained without and with intravenous contrast. CONTRAST:  8mL GADAVIST  GADOBUTROL  1 MMOL/ML IV SOLN COMPARISON:  CT head November 11, 2023. 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 Mild edema without mass effect. No midline shift. Probable additional punctate acute infarct in the inferior left cerebellum. No evidence of acute hemorrhage, mass lesion, or hydrocephalus. Vascular: Major arterial flow voids are maintained at the skull base. Skull and upper cervical spine: Normal marrow signal. Sinuses/Orbits: Clear sinuses.  No acute orbital findings. IMPRESSION: 1. 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 Probable additional punctate acute infarct in the inferior left cerebellum. These results will be called to the ordering clinician or  representative by the Radiologist Assistant, and communication documented in the PACS or [employer]. Electronically Signed   By: Stevenson Elbe M.D.   On: 11/11/2023 20:27   CT CHEST W CONTRAST Result Date: 11/11/2023 CLINICAL DATA:  Liver lesions with history of breast cancer. EXAM: CT CHEST WITH CONTRAST TECHNIQUE: Multidetector CT imaging of the chest was performed during intravenous contrast administration. RADIATION DOSE REDUCTION: This exam was performed according to the departmental dose-optimization program which includes automated  exposure control, adjustment of the mA and/or kV according to patient size and/or use of iterative reconstruction technique. CONTRAST:  75mL OMNIPAQUE  IOHEXOL  300 MG/ML  SOLN COMPARISON:  CT abdomen and pelvis 11/10/2023. FINDINGS: Cardiovascular: Heart is borderline enlarged/mildly enlarged. Aorta is normal in size. There is no pericardial effusion. There are atherosclerotic calcifications of the aorta. Mediastinum/Nodes: There is a heterogeneous left thyroid  nodule attaining cystic and solid components measuring 2.6 x 1.5 cm. There are multiple enlarged right paratracheal lymph nodes measuring up to 2.0 x 2.9 cm. Enlarged subcarinal lymph node measures 1.2 cm short axis. There is a prominent right lower esophageal lymph node measuring 8 mm short axis. There are multiple enlarged right hilar lymph nodes measuring up to 1.4 x 1.8 cm. Visualized esophagus is within normal limits. Lungs/Pleura: Mild emphysematous changes are present. There is scarring in both lung apices. Multifocal airspace disease is seen throughout the right lower lobe, predominantly posteriorly and peripherally. Some areas are slightly nodular for example superior segment measuring 3.2 by 2.0 cm. A small air-fluid level seen in this region. No other air-fluid levels are seen. Additional nodular density seen in the right lower lobe image 5/92 measuring 9 mm. Smaller nodules are seen scattered throughout the right lower lobe measuring 5 mm or less. There is a trace right pleural effusion. There is a right middle lobe nodule measuring 5 mm image 5/122. There are prominent intrapulmonary lymph nodes along the right major fissure. There are 2 mm nodular densities in the left upper lobe. Left lung is otherwise clear. Upper Abdomen: Subcentimeter hypodensities in the liver and left adrenal mass are unchanged. Please see CT abdomen and pelvis performed same day for further description. Musculoskeletal: There is anterior intramuscular chest wall edema on  the right. Focal density is seen in the medial right breast extending to the skin surface measuring 1.2 x 2.3 cm image 3/81. There some associated diffuse right breast skin thickening. No acute fracture. There is a single 5 mm sclerotic density in the T3 vertebral body. IMPRESSION: 1. Multifocal airspace disease throughout the right lower lobe, predominantly posteriorly and peripherally. Single area is slightly nodular in the superior segment containing an air-fluid level worrisome for necrotic pneumonia or necrotic nodule. Findings may be infectious/inflammatory, but neoplasm not excluded. 2. Trace right pleural effusion. 3. Mediastinal and right hilar lymphadenopathy. 4. Additional right lower lobe and right middle lobe pulmonary nodules measuring 5 mm. 5. Left thyroid  nodule measuring 2.6 cm. Recommend further evaluation with ultrasound. 6. Focal density in the medial right breast extending to the skin surface measuring 1.2 x 2.3 cm. Correlate with physical exam. 7. Single 5 mm sclerotic density in the T3 vertebral body. Aortic Atherosclerosis (ICD10-I70.0) and Emphysema (ICD10-J43.9). Electronically Signed   By: Tyron Gallon M.D.   On: 11/11/2023 17:53   CT HEAD WO CONTRAST ( ) Result Date: 11/11/2023 CLINICAL DATA:  Right facial weakness. EXAM: CT HEAD WITHOUT CONTRAST TECHNIQUE: Contiguous axial images were obtained from the base of the skull through the vertex without intravenous contrast.  RADIATION DOSE REDUCTION: This exam was performed according to the departmental dose-optimization program which includes automated exposure control, adjustment of the mA and/or kV according to patient size and/or use of iterative reconstruction technique. COMPARISON:  Head CT 07/21/2022 FINDINGS: Brain: There are new small hypodensities in the left frontoparietal white matter at the level of the posterior centrum semiovale (series 3, image 20), and there is a new subcentimeter focus of cortical hypodensity at the  level of the left parietal operculum (series 6, image 46), suspicious for recent infarcts. No intracranial hemorrhage, mass, midline shift, or extra-axial fluid collection is identified. Cerebral volume is normal. The ventricles are normal in size. Vascular: No hyperdense vessel. Skull: No acute fracture or suspicious lesion. Sinuses/Orbits: Visualized paranasal sinuses and mastoid air cells are clear. Bilateral cataract extraction. Other: None. 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 A brain MRI is in progress and will provide further evaluation. Electronically Signed   By: Aundra Lee M.D.   On: 11/11/2023 15:13   DG Chest 2 View Result Date: 11/10/2023 CLINICAL DATA:  dyspnea EXAM: CHEST - 2 VIEW COMPARISON:  Chest x-ray 07/21/2022, chest x-ray 02/15/2021 FINDINGS: The heart and mediastinal contours are unchanged. Prominent hilar vasculature. No focal consolidation. No pulmonary edema. No pleural effusion. No pneumothorax. No acute osseous abnormality. IMPRESSION: Prominent hilar vasculature suggestive of pulmonary venous congestion. Underlying lymph nodes not excluded. Electronically Signed   By: Morgane  Naveau M.D.   On: 11/10/2023 23:50   CT ABDOMEN PELVIS W CONTRAST Result Date: 11/10/2023 CLINICAL DATA:  Acute nonlocalized abdominal pain. Vomiting and diarrhea. EXAM: CT ABDOMEN AND PELVIS WITH CONTRAST TECHNIQUE: Multidetector CT imaging of the abdomen and pelvis was performed using the standard protocol following bolus administration of intravenous contrast. RADIATION DOSE REDUCTION: This exam was performed according to the departmental dose-optimization program which includes automated exposure control, adjustment of the mA and/or kV according to patient size and/or use of iterative reconstruction technique. CONTRAST:  OMNIPAQUE  IOHEXOL  300 MG/ML  SOLN COMPARISON:  None Available. FINDINGS: Lower chest: Focal consolidation suggested in the  right lung base with small right pleural effusion. This may represent pneumonia or less likely aspiration. Emphysematous changes in the lungs. Hepatobiliary: Multiple scattered low-attenuation lesions in the liver, most are subcentimeter in size. Largest is in the medial segment left lobe measuring 1.3 cm in diameter. Enhancement pattern is indeterminate. Consider follow-up MRI for further characterization. The gallbladder is not visualized, likely surgically absent. Mild intrahepatic bile duct dilatation is most likely due to postoperative change. No common duct stones are identified. Pancreas: Unremarkable. No pancreatic ductal dilatation or surrounding inflammatory changes. Spleen: Segmental low-attenuation in the upper spleen likely represents a splenic infarct. Spleen size is normal. Adrenals/Urinary Tract: 1.3 cm diameter left adrenal gland nodule with density measurement of 69 Hounsfield units. Renal nephrograms are symmetrical. No solid mass identified. No hydronephrosis or hydroureter. Bladder is normal. Stomach/Bowel: Stomach is within normal limits. Appendix appears normal. No evidence of bowel wall thickening, distention, or inflammatory changes. Vascular/Lymphatic: Aortic atherosclerosis. No enlarged abdominal or pelvic lymph nodes. Reproductive: Uterus and bilateral adnexa are unremarkable. Other: No abdominal wall hernia or abnormality. No abdominopelvic ascites. Musculoskeletal: Postoperative fixation of the right hip. No acute bony abnormalities. IMPRESSION: 1. Nonspecific low-attenuation lesions in the liver, largest measuring 1.3 cm diameter. Enhancement pattern is indeterminate. Consider follow-up MRI for further characterization. 2. Mild intrahepatic bile duct dilatation is likely postoperative in the setting of cholecystectomy. 3. Left adrenal mass measuring 1.3  cm, probable benign adenoma. Recommend follow-up adrenal washout CT in 1 year. If stable for = 1 year, no further follow-up imaging.  JACR 2017 Aug; 14(8):1038-44, JCAT 2016 Mar-Apr; 40(2):194-200, Urol J 2006 Spring; 3(2):71-4. 4. Aortic atherosclerosis. 5. Focal area of consolidation suggested in the right lung base with small right pleural effusion, likely pneumonia. Electronically Signed   By: Boyce Byes M.D.   On: 11/10/2023 23:30

## 2023-12-08 NOTE — Assessment & Plan Note (Addendum)
 Continue Lovenox  1 mg/kg subcutaneous twice daily. Continue statin

## 2023-12-08 NOTE — Assessment & Plan Note (Signed)
Goals of care was discussed with patient 

## 2023-12-08 NOTE — Progress Notes (Signed)
 Pt here for follow up. Pt reports that she has been having back pain and oxycodone  not helping much.

## 2023-12-08 NOTE — Progress Notes (Signed)
ON PATHWAY REGIM[facility] - Breast  No Change  Continue With Treatment as Ordered.  Original Decision Date/Time: 08/07/2021 19:45     A cycle is every 21 days:     Ado-trastuzumab emtansine   **Always confirm dose/schedule in your pharmacy ordering system**  Patient Characteristics: Post-Neoadjuvant Therapy and Resection, HER2 Positive, ER Positive, Residual Disease, Adjuvant Targeted Therapy After Neoadjuvant Chemo/Targeted Therapy Therapeutic Status: Post-Neoadjuvant Therapy and Resection Residual Invasive Disease Post-Neoadjuvant Therapy<= Yes ER Status: Positive (+) HER2 Status: Positive (+) PR Status: Positive (+) Intent of Therapy: Curative Intent, Discussed with Patient

## 2023-12-08 NOTE — Assessment & Plan Note (Addendum)
 Right breast cT2 cN0 invasive carcinoma, ER/PR positive, HER2 positive -->neoadjuvant chemo S/p TCH x2, and TCHP x4 followed by lumpectomy and sentinel lymph node biopsy. ypT1a ypN0- Residual disease after neoadjuvant chemotherapy, status post adjuvant radiation S/p 1 year of Kadcyla  --> 10/2023 CT/MRI showed recurrence --> biopsy showed metastatic adenocarcinoma, breast origin. Triple negative.  Images were reviewed and discussed with patient.  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  Obtain PET scan. Stop Tamoxifen  Recommend palliative chemotherapy with weekly Taxol. Rationale and side effects were reviewed with patient.   She agrees with the plan. Send off NGS, PD-L1, if >=10, consider add pembrolizumab. She would like to get medi port placed. Will arrange.

## 2023-12-08 NOTE — Telephone Encounter (Signed)
 [facility] NGS testing (xR+xT) requested on supraclavicular lymph node, [id number] collected on 11/19/23.   Financial assistance application completed and approved. Patient's out-of-pocket cost will be $0

## 2023-12-08 NOTE — Assessment & Plan Note (Signed)
 Continue Lovenox  1 mg/kg subcutaneous twice daily.

## 2023-12-09 ENCOUNTER — Other Ambulatory Visit: Payer: Self-pay

## 2023-12-09 ENCOUNTER — Telehealth: Payer: Self-pay

## 2023-12-09 DIAGNOSIS — C50919 Malignant neoplasm of unspecified site of unspecified female breast: Secondary | ICD-10-CM

## 2023-12-09 NOTE — Telephone Encounter (Signed)
-----   Message from Debra [name] sent at 12/08/2023  7:50 PM EDT ----- Please arrange her to get medi port placement ASAP. Thanks.

## 2023-12-09 NOTE — Progress Notes (Signed)
 Pharmacist Chemotherapy Monitoring - Initial Assessment    Anticipated start date: 12/15/23   The following has been reviewed per standard work regarding the patient's treatment regimen: The patient's diagnosis, treatment plan and drug doses, and organ/hematologic function Lab orders and baseline tests specific to treatment regimen  The treatment plan start date, drug sequencing, and pre-medications Prior authorization status  Patient's documented medication list, including drug-drug interaction screen and prescriptions for anti-emetics and supportive care specific to the treatment regimen The drug concentrations, fluid compatibility, administration routes, and timing of the medications to be used The patient's access for treatment and lifetime cumulative dose history, if applicable  The patient's medication allergies and previous infusion related reactions, if applicable   Changes made to treatment plan:  N/A  Follow up needed:  Pending port placement. Planned 12/11/23   Glendora Landsman, PharmD, BCPS Clinical Pharmacist   12/09/2023  2:33 PM

## 2023-12-09 NOTE — Telephone Encounter (Signed)
 Pt has been scheduled or port placement on Friday 4/25 at 11a an arrive at 10a. Pt and husband aware of appt details.

## 2023-12-09 NOTE — Telephone Encounter (Signed)
 Request for port placement sent to IR.

## 2023-12-11 ENCOUNTER — Other Ambulatory Visit: Payer: Self-pay | Admitting: Physician Assistant

## 2023-12-11 ENCOUNTER — Ambulatory Visit
Admission: RE | Admit: 2023-12-11 | Discharge: 2023-12-11 | Disposition: A | Discharge status: Home / Self Care | Source: Ambulatory Visit | Attending: Oncology | Admitting: Oncology

## 2023-12-11 DIAGNOSIS — C50919 Malignant neoplasm of unspecified site of unspecified female breast: Secondary | ICD-10-CM

## 2023-12-11 DIAGNOSIS — Z01818 Encounter for other preprocedural examination: Secondary | ICD-10-CM

## 2023-12-11 NOTE — Progress Notes (Signed)
 Patient for IR Port Insertion on Monday 12/14/23, I called and spoke with the patient on the phone and gave pre-procedure instructions. Pt was made aware to be here at 10a, NPO after MN prior to procedure as well as driver post procedure/recovery/discharge. Pt stated understanding.  Called 12/11/23

## 2023-12-13 ENCOUNTER — Other Ambulatory Visit: Payer: Self-pay

## 2023-12-14 ENCOUNTER — Other Ambulatory Visit: Payer: Self-pay

## 2023-12-14 ENCOUNTER — Ambulatory Visit
Admission: RE | Admit: 2023-12-14 | Discharge: 2023-12-14 | Disposition: A | Discharge status: Home / Self Care | Source: Ambulatory Visit | Attending: Oncology | Admitting: Radiology

## 2023-12-14 DIAGNOSIS — C50911 Malignant neoplasm of unspecified site of right female breast: Secondary | ICD-10-CM | POA: Insufficient documentation

## 2023-12-14 DIAGNOSIS — C50919 Malignant neoplasm of unspecified site of unspecified female breast: Secondary | ICD-10-CM

## 2023-12-14 DIAGNOSIS — Z01818 Encounter for other preprocedural examination: Secondary | ICD-10-CM

## 2023-12-14 DIAGNOSIS — Z87891 Personal history of nicotine dependence: Secondary | ICD-10-CM | POA: Insufficient documentation

## 2023-12-14 HISTORY — PX: IR IMAGING GUIDED PORT INSERTION: IMG5740

## 2023-12-14 MED ORDER — MIDAZOLAM HCL 2 MG/2ML IJ SOLN
INTRAMUSCULAR | Status: AC
  Filled 2023-12-14: qty 2

## 2023-12-14 MED ORDER — HEPARIN SOD (PORK) LOCK FLUSH 100 UNIT/ML IV SOLN
500.0000 [IU] | Freq: Once | INTRAVENOUS | Status: AC
  Administered 2023-12-14: 500 [IU] via INTRAVENOUS

## 2023-12-14 MED ORDER — HEPARIN SOD (PORK) LOCK FLUSH 100 UNIT/ML IV SOLN
INTRAVENOUS | Status: AC
  Filled 2023-12-14: qty 5

## 2023-12-14 MED ORDER — SODIUM CHLORIDE 0.9 % IV SOLN: INTRAVENOUS | Status: DC

## 2023-12-14 MED ORDER — LIDOCAINE-EPINEPHRINE 1 %-1:100000 IJ SOLN
INTRAMUSCULAR | Status: AC
  Filled 2023-12-14: qty 1

## 2023-12-14 MED ORDER — FENTANYL CITRATE (PF) 100 MCG/2ML IJ SOLN
INTRAMUSCULAR | Status: AC | PRN
  Administered 2023-12-14 (×2): 25 ug via INTRAVENOUS

## 2023-12-14 MED ORDER — MIDAZOLAM HCL 2 MG/2ML IJ SOLN
INTRAMUSCULAR | Status: AC | PRN
  Administered 2023-12-14 (×2): .5 mg via INTRAVENOUS

## 2023-12-14 MED ORDER — METOPROLOL TARTRATE 5 MG/5ML IV SOLN
5.0000 mg | Freq: Once | INTRAVENOUS | Status: AC
  Administered 2023-12-14: 5 mg via INTRAVENOUS
  Filled 2023-12-14: qty 5

## 2023-12-14 MED ORDER — LIDOCAINE-EPINEPHRINE 1 %-1:100000 IJ SOLN
15.0000 mL | Freq: Once | INTRAMUSCULAR | Status: AC
  Administered 2023-12-14: 15 mL via INTRADERMAL

## 2023-12-14 MED ORDER — METOPROLOL TARTRATE 5 MG/5ML IV SOLN
INTRAVENOUS | Status: AC
Start: 2023-12-14 — End: ?
  Filled 2023-12-14: qty 5

## 2023-12-14 MED ORDER — FENTANYL CITRATE (PF) 100 MCG/2ML IJ SOLN
INTRAMUSCULAR | Status: AC
  Filled 2023-12-14: qty 2
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
## 2023-12-14 NOTE — Progress Notes (Signed)
 Patient post IR Port placement per Dr Marne Sings, given iv metoprolol   pre procedure secondarily to RAF, per Dr Marne Sings. Hr more stable rate thus continued with port placement. Bp stable along with hr post procedure. Received Versed  1 mg along with Fentanyl  50 mcg IV for procedure. Report given to Joni Boyd RN post procedure/specials/15

## 2023-12-14 NOTE — Procedures (Signed)
Interventional Radiology Procedure Note  Procedure: Placement of a right IJ approach Bard Clear Vue single lumen PowerPort.  Tip is positioned at the superior cavoatrial junction and catheter is ready for immediate use.  Complications: No immediate Recommendations:  - Ok to shower tomorrow - Do not submerge for 7 days - Routine line care   Signed,  Sterling Big, MD

## 2023-12-15 ENCOUNTER — Inpatient Hospital Stay

## 2023-12-15 ENCOUNTER — Encounter: Payer: Self-pay | Admitting: Oncology

## 2023-12-15 ENCOUNTER — Other Ambulatory Visit: Payer: Self-pay | Admitting: Oncology

## 2023-12-15 ENCOUNTER — Inpatient Hospital Stay (HOSPITAL_BASED_OUTPATIENT_CLINIC_OR_DEPARTMENT_OTHER): Admitting: Oncology

## 2023-12-15 ENCOUNTER — Inpatient Hospital Stay (HOSPITAL_BASED_OUTPATIENT_CLINIC_OR_DEPARTMENT_OTHER): Admitting: Hospice and Palliative Medicine

## 2023-12-15 VITALS — BP 128/69 | HR 85 | Temp 95.6°F | Resp 18 | Wt 179.7 lb

## 2023-12-15 DIAGNOSIS — M8580 Other specified disorders of bone density and structure, unspecified site: Secondary | ICD-10-CM

## 2023-12-15 DIAGNOSIS — G893 Neoplasm related pain (acute) (chronic): Secondary | ICD-10-CM | POA: Diagnosis not present

## 2023-12-15 DIAGNOSIS — R110 Nausea: Secondary | ICD-10-CM

## 2023-12-15 DIAGNOSIS — D649 Anemia, unspecified: Secondary | ICD-10-CM

## 2023-12-15 DIAGNOSIS — Z515 Encounter for palliative care: Secondary | ICD-10-CM | POA: Diagnosis not present

## 2023-12-15 DIAGNOSIS — I6310 Cerebral infarction due to embolism of unspecified precerebral artery: Secondary | ICD-10-CM

## 2023-12-15 DIAGNOSIS — E46 Unspecified protein-calorie malnutrition: Secondary | ICD-10-CM | POA: Diagnosis not present

## 2023-12-15 DIAGNOSIS — E876 Hypokalemia: Secondary | ICD-10-CM | POA: Diagnosis not present

## 2023-12-15 DIAGNOSIS — C50919 Malignant neoplasm of unspecified site of unspecified female breast: Secondary | ICD-10-CM

## 2023-12-15 DIAGNOSIS — C50211 Malignant neoplasm of upper-inner quadrant of right female breast: Secondary | ICD-10-CM | POA: Diagnosis not present

## 2023-12-15 DIAGNOSIS — I480 Paroxysmal atrial fibrillation: Secondary | ICD-10-CM

## 2023-12-15 LAB — CMP (CANCER CENTER ONLY)
ALT: 16 U/L (ref 0–44)
AST: 20 U/L (ref 15–41)
Albumin: 2.7 g/dL — ABNORMAL LOW (ref 3.5–5.0)
Alkaline Phosphatase: 108 U/L (ref 38–126)
Anion gap: 12 (ref 5–15)
BUN: 13 mg/dL (ref 8–23)
CO2: 27 mmol/L (ref 22–32)
Calcium: 9.1 mg/dL (ref 8.9–10.3)
Chloride: 95 mmol/L — ABNORMAL LOW (ref 98–111)
Creatinine: 1.04 mg/dL — ABNORMAL HIGH (ref 0.44–1.00)
GFR, Estimated: 58 mL/min — ABNORMAL LOW (ref >60)
Glucose, Bld: 139 mg/dL — ABNORMAL HIGH (ref 70–99)
Potassium: 3.8 mmol/L (ref 3.5–5.1)
Sodium: 134 mmol/L — ABNORMAL LOW (ref 135–145)
Total Bilirubin: 0.5 mg/dL (ref 0.0–1.2)
Total Protein: 6.9 g/dL (ref 6.5–8.1)

## 2023-12-15 LAB — CBC WITH DIFFERENTIAL (CANCER CENTER ONLY)
Abs Immature Granulocytes: 0.29 10*3/uL — ABNORMAL HIGH (ref 0.00–0.07)
Basophils Absolute: 0.1 10*3/uL (ref 0.0–0.1)
Basophils Relative: 1 %
Eosinophils Absolute: 0.1 10*3/uL (ref 0.0–0.5)
Eosinophils Relative: 1 %
HCT: 24.9 % — ABNORMAL LOW (ref 36.0–46.0)
Hemoglobin: 7.9 g/dL — ABNORMAL LOW (ref 12.0–15.0)
Immature Granulocytes: 2 %
Lymphocytes Relative: 17 %
Lymphs Abs: 2.1 10*3/uL (ref 0.7–4.0)
MCH: 30.2 pg (ref 26.0–34.0)
MCHC: 31.7 g/dL (ref 30.0–36.0)
MCV: 95 fL (ref 80.0–100.0)
Monocytes Absolute: 1.1 10*3/uL — ABNORMAL HIGH (ref 0.1–1.0)
Monocytes Relative: 9 %
Neutro Abs: 8.8 10*3/uL — ABNORMAL HIGH (ref 1.7–7.7)
Neutrophils Relative %: 70 %
Platelet Count: 276 10*3/uL (ref 150–400)
RBC: 2.62 MIL/uL — ABNORMAL LOW (ref 3.87–5.11)
RDW: 16.6 % — ABNORMAL HIGH (ref 11.5–15.5)
WBC Count: 12.5 10*3/uL — ABNORMAL HIGH (ref 4.0–10.5)
nRBC: 0.2 % (ref 0.0–0.2)

## 2023-12-15 LAB — PREPARE RBC (CROSSMATCH)

## 2023-12-15 LAB — MAGNESIUM: Magnesium: 1.5 mg/dL — ABNORMAL LOW (ref 1.7–2.4)

## 2023-12-15 MED ORDER — OXYCODONE HCL 5 MG PO TABS
5.0000 mg | ORAL_TABLET | Freq: Once | ORAL | Status: AC
  Administered 2023-12-15: 5 mg via ORAL
  Filled 2023-12-15: qty 1

## 2023-12-15 MED ORDER — HEPARIN SOD (PORK) LOCK FLUSH 100 UNIT/ML IV SOLN
500.0000 [IU] | Freq: Once | INTRAVENOUS | Status: AC | PRN
  Administered 2023-12-15: 500 [IU]
  Filled 2023-12-15: qty 5

## 2023-12-15 MED ORDER — DIPHENHYDRAMINE HCL 50 MG/ML IJ SOLN
50.0000 mg | Freq: Once | INTRAMUSCULAR | Status: AC
  Administered 2023-12-15: 50 mg via INTRAVENOUS
  Filled 2023-12-15: qty 1

## 2023-12-15 MED ORDER — POTASSIUM CHLORIDE CRYS ER 20 MEQ PO TBCR: 20.0000 meq | EXTENDED_RELEASE_TABLET | Freq: Every day | ORAL | 0 refills | Status: DC

## 2023-12-15 MED ORDER — MAGNESIUM CHLORIDE 64 MG PO TBEC: 1.0000 | DELAYED_RELEASE_TABLET | Freq: Every day | ORAL | 2 refills | Status: DC

## 2023-12-15 MED ORDER — SODIUM CHLORIDE 0.9 % IV SOLN
INTRAVENOUS | Status: DC
  Filled 2023-12-15 (×2): qty 250

## 2023-12-15 MED ORDER — DEXAMETHASONE SODIUM PHOSPHATE 10 MG/ML IJ SOLN
10.0000 mg | Freq: Once | INTRAMUSCULAR | Status: AC
  Administered 2023-12-15: 10 mg via INTRAVENOUS
  Filled 2023-12-15: qty 1

## 2023-12-15 MED ORDER — MAGNESIUM SULFATE 2 GM/50ML IV SOLN
2.0000 g | Freq: Once | INTRAVENOUS | Status: AC
  Administered 2023-12-15: 2 g via INTRAVENOUS
  Filled 2023-12-15: qty 50

## 2023-12-15 MED ORDER — SODIUM CHLORIDE 0.9 % IV SOLN
80.0000 mg/m2 | Freq: Once | INTRAVENOUS | Status: AC
  Administered 2023-12-15: 150 mg via INTRAVENOUS
  Filled 2023-12-15: qty 25

## 2023-12-15 MED ORDER — FAMOTIDINE IN NACL 20-0.9 MG/50ML-% IV SOLN
20.0000 mg | Freq: Once | INTRAVENOUS | Status: AC
  Administered 2023-12-15: 20 mg via INTRAVENOUS
  Filled 2023-12-15: qty 50

## 2023-12-15 NOTE — Assessment & Plan Note (Signed)
 Continue Lovenox  1 mg/kg subcutaneous twice daily. Continue statin

## 2023-12-15 NOTE — Assessment & Plan Note (Signed)
 Recommend ondansetron  disintegrating tablets 8 mg every 8 hours as needed.

## 2023-12-15 NOTE — Assessment & Plan Note (Signed)
 Potassium level has normalized.  Recommend to decrease potassium chloride  20 mEq once daily.

## 2023-12-15 NOTE — Assessment & Plan Note (Signed)
 Right breast cT2 cN0 invasive carcinoma, ER/PR positive, HER2 positive -->neoadjuvant chemo S/p TCH x2, and TCHP x4 followed by lumpectomy and sentinel lymph node biopsy. ypT1a ypN0- Residual disease after neoadjuvant chemotherapy, status post adjuvant radiation S/p 1 year of Kadcyla  --> 10/2023 CT/MRI showed recurrence --> biopsy showed metastatic adenocarcinoma, breast origin. Triple negative.  Images were reviewed and discussed with patient.  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  Obtain PET scan.  Recommend palliative chemotherapy with weekly Taxol.  Labs are reviewed and discussed with patient.  Send off NGS, PD-L1, if >=10, consider add pembrolizumab.

## 2023-12-15 NOTE — Assessment & Plan Note (Signed)
 Continue Lovenox  1 mg/kg subcutaneous twice daily.

## 2023-12-15 NOTE — Progress Notes (Signed)
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 chlorhexidine , and draped in the usual sterile fashion using maximum barrier technique (cap and mask, sterile gown, sterile gloves, large sterile sheet, hand hygiene and cutaneous antiseptic). Local anesthesia was attained by infiltration with 1% lidocaine  with epinephrine . Ultrasound demonstrated patency of the right internal jugular vein, and this was documented with an image. Under real-time ultrasound guidance, this vein was accessed with a 21 gauge micropuncture needle and image documentation was performed. A small dermatotomy was made at the access site with an 11 scalpel. A 0.018" wire was advanced into the SVC and the access needle exchanged for a 33F micropuncture vascular sheath. The 0.018" wire was then removed and a 0.035" wire advanced into the IVC. An appropriate location for the subcutaneous reservoir was selected below the clavicle and an incision was made through the skin and  underlying soft tissues. The subcutaneous tissues were then dissected using a combination of blunt and sharp surgical technique and a pocket was formed. A Bard Clear Vue single lumen power injectable portacatheter was then tunneled through the subcutaneous tissues from the pocket to the dermatotomy and the port reservoir placed within the subcutaneous pocket. The venous access site was then serially dilated and a peel away vascular sheath placed over the wire. The wire was removed and the port catheter advanced into position under fluoroscopic guidance. The catheter tip is positioned in the upper right atrium. This was documented with a spot image. The portacatheter was then tested and found to flush and aspirate well. The port was flushed with saline followed by 100 units/mL heparinized saline. The pocket was then closed in two layers using first subdermal inverted interrupted absorbable sutures followed by a running subcuticular suture. The epidermis was then sealed with Dermabond. The dermatotomy at the venous access site was also closed with Dermabond. IMPRESSION: Successful placement of a right IJ approach Bard Clear Vue port catheter with ultrasound and fluoroscopic guidance. The catheter is ready for use. Electronically Signed   By: Fernando Hoyer M.D.   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 Recent biopsy of a right supraclavicular lymph node but additional tissue is needed for diagnostic purposes. EXAM: ULTRASOUND-GUIDED CORE BIOPSY OF RIGHT SUPRACLAVICULAR LYMPH NODE MEDICATIONS: Moderate sedation ANESTHESIA/SEDATION: Moderate (conscious) sedation was employed during this procedure. A total of Versed  1 mg and Fentanyl  50 mcg was administered intravenously by the radiology  nurse. Total intra-service moderate Sedation Time: 15 minutes. The patient's level of consciousness and vital signs were monitored  continuously by radiology nursing throughout the procedure under my direct supervision. FLUOROSCOPY TIME:  None COMPLICATIONS: None immediate. PROCEDURE: Informed written consent was obtained from the patient after a thorough discussion of the procedural risks, benefits and alternatives. All questions were addressed. A timeout was performed prior to the initiation of the procedure. Ultrasound was used to identify enlarged right supraclavicular lymph nodes. Right supraclavicular area was prepped with chlorhexidine  and sterile field was created. Skin was anesthetized with 1% lidocaine . Small incision was made. Using ultrasound guidance, 17 gauge coaxial needle was directed into a supraclavicular lymph node. Total of 5 core biopsies were obtained with an 18 gauge core device. Specimens placed in formalin. 17 gauge coaxial needle was removed. Bandage placed over the puncture site. FINDINGS: Multiple enlarged right supraclavicular lymph nodes. Core biopsy needle confirmed within the supraclavicular lymph node. No immediate bleeding or hematoma formation. IMPRESSION: Successful ultrasound-guided core biopsy of an enlarged right supraclavicular lymph node. Electronically Signed   By: Elene Griffes M.D.   On: 11/19/2023 16:16   ECHOCARDIOGRAM LIMITED Result Date: 11/18/2023    ECHOCARDIOGRAM LIMITED REPORT   Patient Name:   MAYOLA OTTOSON Date of Exam: 11/17/2023 Medical Rec #:  [id number]    Height:       64.0 in Accession #:    [id number]   Weight:       179.9 lb Date of Birth:  [birth year]     BSA:          1.870 m Patient Age:    13 years     BP:           114/88 mmHg Patient Gender: F            HR:           84 bpm. Exam Location:  [facility] Procedure: 2D Echo, Limited Echo, Limited Color Doppler and Cardiac Doppler            (Both Spectral and Color Flow Doppler were utilized during            procedure). Indications:     Elevated Troponin  History:         Patient has prior history of Echocardiogram examinations, most                   recent 11/11/2023. COPD, Arrythmias:Atrial Fibrillation; Risk                  Factors:Hypertension and Dyslipidemia. Obstructive sleep apnea.                  Fatigue. Breast Cancer.  Sonographer:     Brigid Canada RDCS Referring Phys:  [id number] RYAN M DUNN Diagnosing Phys: Sammy Crisp MD IMPRESSIONS  1. Left ventricular ejection fraction, by estimation, is 60 to 65%. The left ventricle has normal function. The left ventricle has no regional wall motion abnormalities. Left ventricular diastolic parameters were normal.  2. Right ventricular systolic function is normal. The right ventricular size is normal. Tricuspid regurgitation signal is inadequate for assessing PA pressure.  3. The mitral valve is degenerative. At least moderate mitral valve regurgitation, which may be underestimated due to jet eccentricity. No evidence of mitral stenosis.  4. Aortic valve regurgitation is not visualized. No aortic stenosis is present.  5. The inferior vena cava is normal in size with greater than 50% respiratory variability, suggesting  right atrial pressure of 3 mmHg. FINDINGS  Left Ventricle: Left ventricular ejection fraction, by estimation, is 60 to 65%. The left ventricle has normal function. The left ventricle has no regional wall motion abnormalities. The left ventricular internal cavity size was normal in size. There is  no left ventricular hypertrophy. Left ventricular diastolic parameters were normal. Right Ventricle: The right ventricular size is normal. No increase in right ventricular wall thickness. Right ventricular systolic function is normal. Tricuspid regurgitation signal is inadequate for assessing PA pressure. Pericardium: There is no evidence of pericardial effusion. Mitral Valve: The mitral valve is degenerative in appearance. There is moderate thickening of the mitral valve leaflet(s). Moderate mitral valve regurgitation, with wall-impinging jet. No evidence of mitral valve stenosis.  Tricuspid Valve: The tricuspid valve is normal in structure. Tricuspid valve regurgitation is trivial. Aortic Valve: Aortic valve regurgitation is not visualized. No aortic stenosis is present. Aortic valve mean gradient measures 6.0 mmHg. Aortic valve peak gradient measures 13.5 mmHg. Aorta: The aortic root is normal in size and structure. Venous: The inferior vena cava is normal in size with greater than 50% respiratory variability, suggesting right atrial pressure of 3 mmHg. LEFT VENTRICLE PLAX 2D LVIDd:         5.20 cm Diastology LVIDs:         2.80 cm LV e' medial:    12.35 cm/s LV PW:         0.70 cm LV E/e' medial:  8.9 LV IVS:        0.70 cm LV e' lateral:   15.75 cm/s                        LV E/e' lateral: 7.0  RIGHT VENTRICLE             IVC RV S prime:     19.85 cm/s  IVC diam: 1.60 cm LEFT ATRIUM         Index LA diam:    3.70 cm 1.98 cm/m  AORTIC VALVE AV Vmax:      183.59 cm/s AV Vmean:     114.120 cm/s AV VTI:       0.317 m AV Peak Grad: 13.5 mmHg AV Mean Grad: 6.0 mmHg  AORTA Ao Root diam: 3.40 cm MITRAL VALVE MV Area (PHT): 3.63 cm MV VTI:        0.40 m MV Decel Time: 209 msec MV E velocity: 109.67 cm/s MV A velocity: 142.67 cm/s MV E/A ratio:  0.77 Veryl Gottron End MD Electronically signed by Sammy Crisp MD Signature Date/Time: 11/18/2023/5:49:00 AM    Final    MR BRAIN WO CONTRAST Result Date: 11/17/2023 CLINICAL DATA:  Provided history: Neuro deficit, acute, stroke suspected. Possible new versus progressed CVA on CTA. Weakness. Aphasia. Facial droop. EXAM: MRI HEAD WITHOUT CONTRAST TECHNIQUE: Multiplanar, multiecho pulse sequences of the brain and surrounding structures were obtained without intravenous contrast. COMPARISON:  Head CT 11/17/2023. CT angiogram head/neck 11/11/2023. Brain MRI 11/11/2023. FINDINGS: Brain: No age-advanced or lobar predominant cerebral atrophy. Numerous small acute infarcts within the left cerebellar hemisphere and left superior cerebellar peduncle, the majority  of which are new from the prior MRI of 11/11/2023. Several new small acute cortical infarcts are present within the right frontal, right parietal, left parietal and left occipital lobes. A new punctate acute infarct is also present within the left thalamus. Known patchy acute/early subacute infarcts elsewhere within the left frontal and left parietal lobes (MCA vascular territory). 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 Background mild multifocal T2 FLAIR hyperintense signal abnormality within the cerebral white matter, nonspecific but compatible with chronic small vessel ischemic disease. Punctate chronic microhemorrhage within the left occipital lobe, unchanged (series 10, image 25). No evidence of an intracranial mass. No extra-axial fluid collection. No midline shift. Vascular: Please see the recent prior CTA head/neck 11/11/2023. Small foci of susceptibility-weighted signal loss along the left frontal lobe, new from the prior MRI and suspicious for emboli within distal left MCA branches (series 10, image 46). Skull and upper cervical spine: No focal worrisome marrow lesion. Incompletely assessed cervical spondylosis. Sinuses/Orbits: No mass or acute finding within the imaged orbits. Prior bilateral ocular lens replacement. Minimal mucosal thickening within the right maxillary sinus. IMPRESSION: 1. Numerous small acute infarcts within the left cerebellar hemisphere/superior cerebellar peduncle, the majority of which are new from the prior brain MRI of 11/11/2023. Additionally, there are several new small acute cortical infarcts within the right frontal, right parietal, left parietal and left occipital lobes, as well as a new punctate acute infarct within the left thalamus. Involvement of multiple vascular territories concerning for an embolic process. New small foci of signal abnormality along the left frontal lobe suspicious for emboli within distal left middle cerebral artery  branches. 2. Known patchy acute/early subacute infarcts elsewhere within the left frontal and left parietal lobes (MCA vascular territory). 3. Background mild cerebral white matter chronic small vessel ischemic disease. 4. Unchanged chronic lacunar infarct within the right caudate nucleus. Electronically Signed   By: Bascom Lily D.O.   On: 11/17/2023 16:31   DG Chest Portable 1 View Result Date: 11/17/2023 CLINICAL DATA:  Vomiting.  Weakness. EXAM: PORTABLE CHEST 1 VIEW COMPARISON:  11/10/2023 FINDINGS: Bilateral lung fields are clear. Bilateral costophrenic angles are clear. Stable cardio-mediastinal silhouette. No acute osseous abnormalities. The soft tissues are within normal limits. IMPRESSION: No active disease. Electronically Signed   By: Beula Brunswick M.D.   On: 11/17/2023 11:49   CT Head Wo Contrast Result Date: 11/17/2023 CLINICAL DATA:  68 year old female neurologic deficit with slurred speech, right facial droop. Recent left MCA M2 occlusion, posterior left MCA infarcts, occasional other small scattered brain infarcts on CTA, MRI. EXAM: CT HEAD WITHOUT CONTRAST TECHNIQUE: Contiguous axial images were obtained from the base of the skull through the vertex without intravenous contrast. RADIATION DOSE REDUCTION: This exam was performed according to the departmental dose-optimization program which includes automated exposure control, adjustment of the mA and/or kV according to patient size and/or use of iterative reconstruction technique. COMPARISON:  Brain MRI 11/11/2023 and earlier FINDINGS: Brain: Posterior left MCA infarcts on MRI remain subtle by CT with no associated hemorrhage or mass effect (series 3, image 20). Small left cerebellum PICA territory infarct is now apparent on CT coronal image 52, and appears different from that on the recent MRI. No associated hemorrhage or mass effect. No acute intracranial hemorrhage identified. No midline shift, mass effect, or evidence of intracranial mass  lesion. No ventriculomegaly. Vascular: Calcified atherosclerosis at the skull base. No suspicious intracranial vascular hyperdensity. Skull: Stable and intact. Sinuses/Orbits: Visualized paranasal sinuses and mastoids are stable and well aerated. Other: Mild rightward gaze. No other acute orbit or scalp soft tissue finding. IMPRESSION: 1. New or progressed Left cerebellar PICA territory infarct since the MRI on 11/11/2023. 2. Stable CT appearance of posterior left MCA territory infarcts. 3. No associated acute intracranial hemorrhage or mass effect. Electronically Signed   By: Arline Laity.D.  On: 11/17/2023 10:15   US  CORE BIOPSY (LYMPH NODES) Result Date: 11/16/2023 INDICATION: Right lower lobe lung mass, lymphadenopathy and liver lesions. Enlarged right supraclavicular lymph node targeted for biopsy. EXAM: ULTRASOUND GUIDED CORE BIOPSY OF RIGHT SUPRACLAVICULAR LYMPH NODE MEDICATIONS: None. ANESTHESIA/SEDATION: Moderate (conscious) sedation was employed during this procedure. A total of Versed  1.5 mg and Fentanyl  75 mcg was administered intravenously. Moderate Sedation Time: 19 minutes. The patient's level of consciousness and vital signs were monitored continuously by radiology nursing throughout the procedure under my direct supervision. PROCEDURE: The procedure, risks, benefits, and alternatives were explained to the patient. Questions regarding the procedure were encouraged and answered. The patient understands and consents to the procedure. A time-out was performed prior to initiating the procedure. The right neck was prepped with chlorhexidine  in a sterile fashion, and a sterile drape was applied covering the operative field. A sterile gown and sterile gloves were used for the procedure. Local anesthesia was provided with 1% Lidocaine . After localizing an enlarged right supraclavicular lymph node, 18 gauge core biopsy samples were obtained through different portions of the lymph node. A total of 5 samples  were obtained with 3 submitted on saline soaked Telfa gauze and 2 in formalin. Additional ultrasound was performed after biopsy. COMPLICATIONS: None immediate. FINDINGS: Several adjacent right supraclavicular lymph nodes are identified by ultrasound. The largest measures approximately 2.2 x 1.4 x 1.7 cm. Solid core biopsy samples were obtained. IMPRESSION: Ultrasound-guided core biopsy performed of an enlarged right supraclavicular lymph node measuring 2.2 cm in greatest diameter by ultrasound. Electronically Signed   By: Erica Hau M.D.   On: 11/16/2023 16:02   ECHOCARDIOGRAM COMPLETE Result Date: 11/12/2023    ECHOCARDIOGRAM REPORT   Patient Name:   LEVADA NEMECEK Date of Exam: 11/11/2023 Medical Rec #:  [id number]    Height:       64.0 in Accession #:    [id number]   Weight:       180.0 lb Date of Birth:  1955-12-09     BSA:          1.871 m Patient Age:    74 years     BP:           110/92 mmHg Patient Gender: F            HR:           80 bpm. Exam Location:  [facility] Procedure: 2D Echo, Cardiac Doppler and Color Doppler (Both Spectral and Color            Flow Doppler were utilized during procedure). Indications:     Elevated Troponin  History:         Patient has prior history of Echocardiogram examinations, most                  recent 04/25/2022. COPD, Arrythmias:Atrial Fibrillation; Risk                  Factors:Hypertension and Dyslipidemia. Obstructive sleep apnea.  Sonographer:     Brigid Canada RDCS Referring Phys:  [id number] Haynes Lips SAYT Diagnosing Phys: Belva Boyden MD IMPRESSIONS  1. Left ventricular ejection fraction, by estimation, is 60 to 65%. The left ventricle has normal function. The left ventricle has no regional wall motion abnormalities. There is mild left ventricular hypertrophy. Left ventricular diastolic parameters are consistent with Grade I diastolic dysfunction (impaired relaxation).  2. Right ventricular systolic function is normal. The right ventricular size is normal.  There is normal  pulmonary artery systolic pressure. The estimated right ventricular systolic pressure is 21.3 mmHg.  3. The mitral valve is normal in structure. Mild to moderate mitral valve regurgitation. No evidence of mitral stenosis.  4. The aortic valve is normal in structure. Aortic valve regurgitation is not visualized. No aortic stenosis is present.  5. The inferior vena cava is normal in size with greater than 50% respiratory variability, suggesting right atrial pressure of 3 mmHg. FINDINGS  Left Ventricle: Left ventricular ejection fraction, by estimation, is 60 to 65%. The left ventricle has normal function. The left ventricle has no regional wall motion abnormalities. Strain was performed and the global longitudinal strain is indeterminate. The left ventricular internal cavity size was normal in size. There is mild left ventricular hypertrophy. Left ventricular diastolic parameters are consistent with Grade I diastolic dysfunction (impaired relaxation). Right Ventricle: The right ventricular size is normal. No increase in right ventricular wall thickness. Right ventricular systolic function is normal. There is normal pulmonary artery systolic pressure. The tricuspid regurgitant velocity is 2.02 m/s, and  with an assumed right atrial pressure of 5 mmHg, the estimated right ventricular systolic pressure is 21.3 mmHg. Left Atrium: Left atrial size was normal in size. Right Atrium: Right atrial size was normal in size. Pericardium: There is no evidence of pericardial effusion. Mitral Valve: The mitral valve is normal in structure. Mild to moderate mitral valve regurgitation. No evidence of mitral valve stenosis. Tricuspid Valve: The tricuspid valve is normal in structure. Tricuspid valve regurgitation is not demonstrated. No evidence of tricuspid stenosis. Aortic Valve: The aortic valve is normal in structure. Aortic valve regurgitation is not visualized. No aortic stenosis is present. Pulmonic Valve: The  pulmonic valve was normal in structure. Pulmonic valve regurgitation is not visualized. No evidence of pulmonic stenosis. Aorta: The aortic root is normal in size and structure. Venous: The inferior vena cava is normal in size with greater than 50% respiratory variability, suggesting right atrial pressure of 3 mmHg. IAS/Shunts: No atrial level shunt detected by color flow Doppler. Additional Comments: 3D was performed not requiring image post processing on an independent workstation and was indeterminate.  LEFT VENTRICLE PLAX 2D LVIDd:         4.90 cm   Diastology LVIDs:         3.10 cm   LV e' medial:    9.36 cm/s LV PW:         1.00 cm   LV E/e' medial:  9.1 LV IVS:        1.00 cm   LV e' lateral:   9.32 cm/s LVOT diam:     2.00 cm   LV E/e' lateral: 9.2 LV SV:         56 LV SV Index:   30 LVOT Area:     3.14 cm  RIGHT VENTRICLE RV Basal diam:  3.30 cm RV S prime:     18.33 cm/s TAPSE (M-mode): 3.2 cm LEFT ATRIUM             Index        RIGHT ATRIUM           Index LA diam:        3.90 cm 2.08 cm/m   RA Area:     11.60 cm LA Vol (A2C):   50.9 ml 27.21 ml/m  RA Volume:   27.60 ml  14.75 ml/m LA Vol (A4C):   38.2 ml 20.42 ml/m LA Biplane Vol: 44.4 ml 23.73 ml/m  AORTIC VALVE LVOT Vmax:   98.00 cm/s LVOT Vmean:  64.000 cm/s LVOT VTI:    0.179 m  AORTA Ao Root diam: 3.30 cm MITRAL VALVE                TRICUSPID VALVE MV Area (PHT): 3.65 cm     TR Peak grad:   16.3 mmHg MV Decel Time: 208 msec     TR Vmax:        202.00 cm/s MR Peak grad: 82.4 mmHg MR Vmax:      454.00 cm/s   SHUNTS MV E velocity: 85.55 cm/s   Systemic VTI:  0.18 m MV A velocity: 123.50 cm/s  Systemic Diam: 2.00 cm MV E/A ratio:  0.69 Belva Boyden MD Electronically signed by Belva Boyden MD Signature Date/Time: 11/12/2023/1:59:01 PM    Final    MR ABDOMEN MRCP W WO CONTAST Result Date: 11/12/2023 CLINICAL DATA:  Evaluate liver lesions.  Chest mass. EXAM: MRI ABDOMEN WITHOUT AND WITH CONTRAST (INCLUDING MRCP) TECHNIQUE: Multiplanar  multisequence MR imaging of the abdomen was performed both before and after the administration of intravenous contrast. Heavily T2-weighted images of the biliary and pancreatic ducts were obtained, and three-dimensional MRCP images were rendered by post processing. CONTRAST:  8mL GADAVIST  GADOBUTROL  1 MMOL/ML IV SOLN COMPARISON:  CT AP 11/10/2023 FINDINGS: Lower chest: Small right pleural effusion. Partially visualized mass within the superior segment of right lower lobe noted. Hepatobiliary: There are multifocal bilobar, ill-defined areas of mild increased T2 signal noted within both lobes of liver which show hypoenhancement on the postcontrast images and restricted diffusion. At least 5 lesions are noted involving both lobes. The largest lesion is in segment 5 measuring 1.1 cm, image 17/8. Lesion in segment 2/3 measures 6 mm, image 10/8. Status post cholecystectomy. Mild intrahepatic bile duct dilatation. Fusiform dilatation of the common bile duct which measures 1.3 cm proximally. No signs of choledocholithiasis or mass. Pancreas:  No main duct dilatation, inflammation or mass identified. Spleen: Within the proximal portion of the spleen there are 2 wedge-shaped areas of peripheral hypoenhancement, image 25/31. Adrenals/Urinary Tract: There is a 1.3 cm nodule in the left adrenal gland which is indeterminate with only mild loss of signal on the out of phase sequences, image 29/9. This is a new finding when compared with the remote CT of the abdomen pelvis from 09/17/2020. Normal right adrenal gland. No kidney mass or signs of obstructive uropathy. Stomach/Bowel: Visualized portions within the abdomen are unremarkable. Vascular/Lymphatic: No aneurysm. Aortic atherosclerosis. No adenopathy. Other:  No ascites. Musculoskeletal: Multifocal abnormal areas of enhancement are identified throughout the visualized portions of the thoracic and lumbar spine which are concerning for osseous metastasis. IMPRESSION: 1. There  are multifocal bilobar, ill-defined areas of mild increased T2 signal noted within both lobes of liver which show hypoenhancement on the postcontrast images and restricted diffusion. At least 5 lesions are noted involving both lobes. 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 2. Multifocal abnormal areas of enhancement are identified throughout the visualized portions of the thoracic and lumbar spine which are concerning for osseous metastasis. 3. There are 2 wedge-shaped areas of peripheral hypoenhancement within the proximal portion of the spleen which are concerning for splenic infarcts. 4. There is a 1.3 cm nodule in the left adrenal gland which is indeterminate with only mild loss of signal on the out of phase sequences. This is a new finding when compared with the remote CT of the abdomen pelvis from 09/17/2020. Differential considerations include lipid poor  adenoma versus a adrenal metastasis. 5. Small right pleural effusion. 6. Partially visualized mass within the superior segment of right lower lobe. 7. Status post cholecystectomy. Mild intrahepatic bile duct dilatation. Fusiform dilatation of the common bile duct which measures 1.3 cm proximally. No signs of choledocholithiasis or mass. Electronically Signed   By: Kimberley Penman M.D.   On: 11/12/2023 05:07   MR 3D Recon At Scanner Result Date: 11/12/2023 CLINICAL DATA:  Evaluate liver lesions.  Chest mass. EXAM: MRI ABDOMEN WITHOUT AND WITH CONTRAST (INCLUDING MRCP) TECHNIQUE: Multiplanar multisequence MR imaging of the abdomen was performed both before and after the administration of intravenous contrast. Heavily T2-weighted images of the biliary and pancreatic ducts were obtained, and three-dimensional MRCP images were rendered by post processing. CONTRAST:  8mL GADAVIST  GADOBUTROL  1 MMOL/ML IV SOLN COMPARISON:  CT AP 11/10/2023 FINDINGS: Lower chest: Small right pleural effusion. Partially visualized mass within the superior segment of right  lower lobe noted. Hepatobiliary: There are multifocal bilobar, ill-defined areas of mild increased T2 signal noted within both lobes of liver which show hypoenhancement on the postcontrast images and restricted diffusion. At least 5 lesions are noted involving both lobes. The largest lesion is in segment 5 measuring 1.1 cm, image 17/8. Lesion in segment 2/3 measures 6 mm, image 10/8. Status post cholecystectomy. Mild intrahepatic bile duct dilatation. Fusiform dilatation of the common bile duct which measures 1.3 cm proximally. No signs of choledocholithiasis or mass. Pancreas:  No main duct dilatation, inflammation or mass identified. Spleen: Within the proximal portion of the spleen there are 2 wedge-shaped areas of peripheral hypoenhancement, image 25/31. Adrenals/Urinary Tract: There is a 1.3 cm nodule in the left adrenal gland which is indeterminate with only mild loss of signal on the out of phase sequences, image 29/9. This is a new finding when compared with the remote CT of the abdomen pelvis from 09/17/2020. Normal right adrenal gland. No kidney mass or signs of obstructive uropathy. Stomach/Bowel: Visualized portions within the abdomen are unremarkable. Vascular/Lymphatic: No aneurysm. Aortic atherosclerosis. No adenopathy. Other:  No ascites. Musculoskeletal: Multifocal abnormal areas of enhancement are identified throughout the visualized portions of the thoracic and lumbar spine which are concerning for osseous metastasis. IMPRESSION: 1. There are multifocal bilobar, ill-defined areas of mild increased T2 signal noted within both lobes of liver which show hypoenhancement on the postcontrast images and restricted diffusion. At least 5 lesions are noted involving both lobes. 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 2. Multifocal abnormal areas of enhancement are identified throughout the visualized portions of the thoracic and lumbar spine which are concerning for osseous metastasis. 3. There are  2 wedge-shaped areas of peripheral hypoenhancement within the proximal portion of the spleen which are concerning for splenic infarcts. 4. There is a 1.3 cm nodule in the left adrenal gland which is indeterminate with only mild loss of signal on the out of phase sequences. This is a new finding when compared with the remote CT of the abdomen pelvis from 09/17/2020. Differential considerations include lipid poor adenoma versus a adrenal metastasis. 5. Small right pleural effusion. 6. Partially visualized mass within the superior segment of right lower lobe. 7. Status post cholecystectomy. Mild intrahepatic bile duct dilatation. Fusiform dilatation of the common bile duct which measures 1.3 cm proximally. No signs of choledocholithiasis or mass. Electronically Signed   By: Kimberley Penman M.D.   On: 11/12/2023 05:07   CT ANGIO HEAD NECK W WO CM Addendum Date: 11/11/2023 ADDENDUM REPORT: 11/11/2023 23:37 ADDENDUM: Findings discussed  with Brenda Morisson, NP via telephone at 10:33 p.m. Electronically Signed   By: Stevenson Elbe M.D.   On: 11/11/2023 23:37   Result Date: 11/11/2023 CLINICAL DATA:  Stroke/TIA, determine embolic source EXAM: CT ANGIOGRAPHY HEAD AND NECK WITH AND WITHOUT CONTRAST TECHNIQUE: Multidetector CT imaging of the head and neck was performed using the standard protocol during bolus administration of intravenous contrast. Multiplanar CT image reconstructions and MIPs were obtained to evaluate the vascular anatomy. Carotid stenosis measurements (when applicable) are obtained utilizing NASCET criteria, using the distal internal carotid diameter as the denominator. RADIATION DOSE REDUCTION: This exam was performed according to the departmental dose-optimization program which includes automated exposure control, adjustment of the mA and/or kV according to patient size and/or use of iterative reconstruction technique. CONTRAST:  75mL OMNIPAQUE  IOHEXOL  350 MG/ML SOLN COMPARISON:  Same day MRI head.  FINDINGS: CTA NECK FINDINGS Aortic arch: Aortic atherosclerosis. 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 Great vessel origins are patent. Right carotid system: Atherosclerosis at the carotid bifurcation without greater than 50% stenosis. Left carotid system: Atherosclerosis at the carotid bifurcation without greater than 50% stenosis. Vertebral arteries: Codominant. No evidence of dissection, stenosis (50% or greater), or occlusion. Skeleton: No evidence of acute abnormality on limited assessment. Multilevel degenerative change. Other neck: Approximately 2.3 cm left thyroid  nodule. Upper chest: Please see same day CT chest for intrathoracic findings. Emphysema. Review of the MIP images confirms the above findings CTA HEAD FINDINGS Anterior circulation: Bilateral intracranial ICAs are patent. Approximately 2 mm medially directed outpouching arising from the right cavernous ICA, which could represent an infundibulum or aneurysm. Bilateral M1 MCAs are patent. 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 Bilateral ACAs are patent without proximal hemodynamically significant stenosis. Posterior circulation: Bilateral intradural vertebral arteries, basilar artery and bilateral posterior cerebral arteries are patent without proximal hemodynamically significant stenosis. Venous sinuses: As permitted by contrast timing, patent. Review of the MIP images confirms the above findings The ordering provider has been paged at the time of dictation for call of report. IMPRESSION: 1. 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 2. Approximately 2 mm medially directed outpouching arising from the right cavernous ICA, which could represent an infundibulum or aneurysm. 3. Please see same day CT chest for intrathoracic findings. 4. Approximately 2.3 cm left thyroid  nodule. Recommend thyroid  US  (ref: J Am Coll Radiol. 2015 Feb;12(2): 143-50). 5. Aortic  Atherosclerosis (ICD10-I70.0) and Emphysema (ICD10-J43.9). Electronically Signed: By: Stevenson Elbe M.D. On: 11/11/2023 23:14   MR BRAIN W WO CONTRAST Result Date: 11/11/2023 CLINICAL DATA:  right facial weakness, malignancy also on the ddx EXAM: MRI HEAD WITHOUT AND WITH CONTRAST TECHNIQUE: Multiplanar, multiecho pulse sequences of the brain and surrounding structures were obtained without and with intravenous contrast. CONTRAST:  8mL GADAVIST  GADOBUTROL  1 MMOL/ML IV SOLN COMPARISON:  CT head November 11, 2023. 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 Mild edema without mass effect. No midline shift. Probable additional punctate acute infarct in the inferior left cerebellum. No evidence of acute hemorrhage, mass lesion, or hydrocephalus. Vascular: Major arterial flow voids are maintained at the skull base. Skull and upper cervical spine: Normal marrow signal. Sinuses/Orbits: Clear sinuses.  No acute orbital findings. IMPRESSION: 1. 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 Probable additional punctate acute infarct in the inferior left cerebellum. These results will be  called to the ordering clinician or representative by the Radiologist Assistant, and communication documented in the PACS or [employer]. Electronically Signed   By: Stevenson Elbe M.D.   On: 11/11/2023 20:27   CT CHEST W CONTRAST Result Date: 11/11/2023 CLINICAL DATA:  Liver lesions with history of breast cancer. EXAM: CT CHEST WITH CONTRAST TECHNIQUE: Multidetector CT imaging of the chest was performed during intravenous contrast administration. RADIATION DOSE REDUCTION: This exam was performed according to the departmental dose-optimization program which includes automated exposure control, adjustment of the mA and/or kV according to patient size and/or use of iterative  reconstruction technique. CONTRAST:  75mL OMNIPAQUE  IOHEXOL  300 MG/ML  SOLN COMPARISON:  CT abdomen and pelvis 11/10/2023. FINDINGS: Cardiovascular: Heart is borderline enlarged/mildly enlarged. Aorta is normal in size. There is no pericardial effusion. There are atherosclerotic calcifications of the aorta. Mediastinum/Nodes: There is a heterogeneous left thyroid  nodule attaining cystic and solid components measuring 2.6 x 1.5 cm. There are multiple enlarged right paratracheal lymph nodes measuring up to 2.0 x 2.9 cm. Enlarged subcarinal lymph node measures 1.2 cm short axis. There is a prominent right lower esophageal lymph node measuring 8 mm short axis. There are multiple enlarged right hilar lymph nodes measuring up to 1.4 x 1.8 cm. Visualized esophagus is within normal limits. Lungs/Pleura: Mild emphysematous changes are present. There is scarring in both lung apices. Multifocal airspace disease is seen throughout the right lower lobe, predominantly posteriorly and peripherally. Some areas are slightly nodular for example superior segment measuring 3.2 by 2.0 cm. A small air-fluid level seen in this region. No other air-fluid levels are seen. Additional nodular density seen in the right lower lobe image 5/92 measuring 9 mm. Smaller nodules are seen scattered throughout the right lower lobe measuring 5 mm or less. There is a trace right pleural effusion. There is a right middle lobe nodule measuring 5 mm image 5/122. There are prominent intrapulmonary lymph nodes along the right major fissure. There are 2 mm nodular densities in the left upper lobe. Left lung is otherwise clear. Upper Abdomen: Subcentimeter hypodensities in the liver and left adrenal mass are unchanged. Please see CT abdomen and pelvis performed same day for further description. Musculoskeletal: There is anterior intramuscular chest wall edema on the right. Focal density is seen in the medial right breast extending to the skin surface measuring  1.2 x 2.3 cm image 3/81. There some associated diffuse right breast skin thickening. No acute fracture. There is a single 5 mm sclerotic density in the T3 vertebral body. IMPRESSION: 1. Multifocal airspace disease throughout the right lower lobe, predominantly posteriorly and peripherally. Single area is slightly nodular in the superior segment containing an air-fluid level worrisome for necrotic pneumonia or necrotic nodule. Findings may be infectious/inflammatory, but neoplasm not excluded. 2. Trace right pleural effusion. 3. Mediastinal and right hilar lymphadenopathy. 4. Additional right lower lobe and right middle lobe pulmonary nodules measuring 5 mm. 5. Left thyroid  nodule measuring 2.6 cm. Recommend further evaluation with ultrasound. 6. Focal density in the medial right breast extending to the skin surface measuring 1.2 x 2.3 cm. Correlate with physical exam. 7. Single 5 mm sclerotic density in the T3 vertebral body. Aortic Atherosclerosis (ICD10-I70.0) and Emphysema (ICD10-J43.9). Electronically Signed   By: Tyron Gallon M.D.   On: 11/11/2023 17:53   CT HEAD WO CONTRAST ( ) Result Date: 11/11/2023 CLINICAL DATA:  Right facial weakness. EXAM: CT HEAD WITHOUT CONTRAST TECHNIQUE: Contiguous axial images were obtained from the base of the skull  through the vertex without intravenous contrast. RADIATION DOSE REDUCTION: This exam was performed according to the departmental dose-optimization program which includes automated exposure control, adjustment of the mA and/or kV according to patient size and/or use of iterative reconstruction technique. COMPARISON:  Head CT 07/21/2022 FINDINGS: Brain: There are new small hypodensities in the left frontoparietal white matter at the level of the posterior centrum semiovale (series 3, image 20), and there is a new subcentimeter focus of cortical hypodensity at the level of the left parietal operculum (series 6, image 46), suspicious for recent infarcts. No  intracranial hemorrhage, mass, midline shift, or extra-axial fluid collection is identified. Cerebral volume is normal. The ventricles are normal in size. Vascular: No hyperdense vessel. Skull: No acute fracture or suspicious lesion. Sinuses/Orbits: Visualized paranasal sinuses and mastoid air cells are clear. Bilateral cataract extraction. Other: None. 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 A brain MRI is in progress and will provide further evaluation. Electronically Signed   By: Aundra Lee M.D.   On: 11/11/2023 15:13   DG Chest 2 View Result Date: 11/10/2023 CLINICAL DATA:  dyspnea EXAM: CHEST - 2 VIEW COMPARISON:  Chest x-ray 07/21/2022, chest x-ray 02/15/2021 FINDINGS: The heart and mediastinal contours are unchanged. Prominent hilar vasculature. No focal consolidation. No pulmonary edema. No pleural effusion. No pneumothorax. No acute osseous abnormality. IMPRESSION: Prominent hilar vasculature suggestive of pulmonary venous congestion. Underlying lymph nodes not excluded. Electronically Signed   By: Morgane  Naveau M.D.   On: 11/10/2023 23:50   CT ABDOMEN PELVIS W CONTRAST Result Date: 11/10/2023 CLINICAL DATA:  Acute nonlocalized abdominal pain. Vomiting and diarrhea. EXAM: CT ABDOMEN AND PELVIS WITH CONTRAST TECHNIQUE: Multidetector CT imaging of the abdomen and pelvis was performed using the standard protocol following bolus administration of intravenous contrast. RADIATION DOSE REDUCTION: This exam was performed according to the departmental dose-optimization program which includes automated exposure control, adjustment of the mA and/or kV according to patient size and/or use of iterative reconstruction technique. CONTRAST:  OMNIPAQUE  IOHEXOL  300 MG/ML  SOLN COMPARISON:  None Available. FINDINGS: Lower chest: Focal consolidation suggested in the right lung base with small right pleural effusion. This may represent pneumonia or less likely  aspiration. Emphysematous changes in the lungs. Hepatobiliary: Multiple scattered low-attenuation lesions in the liver, most are subcentimeter in size. Largest is in the medial segment left lobe measuring 1.3 cm in diameter. Enhancement pattern is indeterminate. Consider follow-up MRI for further characterization. The gallbladder is not visualized, likely surgically absent. Mild intrahepatic bile duct dilatation is most likely due to postoperative change. No common duct stones are identified. Pancreas: Unremarkable. No pancreatic ductal dilatation or surrounding inflammatory changes. Spleen: Segmental low-attenuation in the upper spleen likely represents a splenic infarct. Spleen size is normal. Adrenals/Urinary Tract: 1.3 cm diameter left adrenal gland nodule with density measurement of 69 Hounsfield units. Renal nephrograms are symmetrical. No solid mass identified. No hydronephrosis or hydroureter. Bladder is normal. Stomach/Bowel: Stomach is within normal limits. Appendix appears normal. No evidence of bowel wall thickening, distention, or inflammatory changes. Vascular/Lymphatic: Aortic atherosclerosis. No enlarged abdominal or pelvic lymph nodes. Reproductive: Uterus and bilateral adnexa are unremarkable. Other: No abdominal wall hernia or abnormality. No abdominopelvic ascites. Musculoskeletal: Postoperative fixation of the right hip. No acute bony abnormalities. IMPRESSION: 1. Nonspecific low-attenuation lesions in the liver, largest measuring 1.3 cm diameter. Enhancement pattern is indeterminate. Consider follow-up MRI for further characterization. 2. Mild intrahepatic bile duct dilatation is likely postoperative in the setting of cholecystectomy.  3. Left adrenal mass measuring 1.3 cm, probable benign adenoma. Recommend follow-up adrenal washout CT in 1 year. If stable for = 1 year, no further follow-up imaging. JACR 2017 Aug; 14(8):1038-44, JCAT 2016 Mar-Apr; 40(2):194-200, Urol J 2006 Spring; 3(2):71-4.  4. Aortic atherosclerosis. 5. Focal area of consolidation suggested in the right lung base with small right pleural effusion, likely pneumonia. Electronically Signed   By: Boyce Byes M.D.   On: 11/10/2023 23:30

## 2023-12-15 NOTE — Assessment & Plan Note (Signed)
 Establish care with palliative care service.

## 2023-12-15 NOTE — Assessment & Plan Note (Signed)
 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 Also possible marrow involvement.  Hemoglobin is below 8, she is symptomatic. Recommend 1 unit of PRBC transfusion this week.

## 2023-12-15 NOTE — Assessment & Plan Note (Signed)
01/15/22 Osteoporosis 10 year FRAX  19.9% Recommend calcium 1200 mg  and vitamin D  daily supplementation.

## 2023-12-15 NOTE — Assessment & Plan Note (Signed)
 Recommend patient to start slow mag 64mg  oral daily IV magnesium  sulfate 2 g x 1 today.
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
## 2023-12-15 NOTE — Patient Instructions (Signed)

## 2023-12-16 ENCOUNTER — Inpatient Hospital Stay: Admitting: Licensed Clinical Social Worker

## 2023-12-16 ENCOUNTER — Inpatient Hospital Stay

## 2023-12-16 DIAGNOSIS — C50211 Malignant neoplasm of upper-inner quadrant of right female breast: Secondary | ICD-10-CM | POA: Diagnosis not present

## 2023-12-16 DIAGNOSIS — D649 Anemia, unspecified: Secondary | ICD-10-CM

## 2023-12-16 MED ORDER — SODIUM CHLORIDE 0.9% IV SOLUTION
250.0000 mL | INTRAVENOUS | Status: DC
  Administered 2023-12-16: 250 mL via INTRAVENOUS
  Filled 2023-12-16: qty 250

## 2023-12-16 MED ORDER — HEPARIN SOD (PORK) LOCK FLUSH 100 UNIT/ML IV SOLN
500.0000 [IU] | Freq: Every day | INTRAVENOUS | Status: DC | PRN
  Filled 2023-12-16: qty 5

## 2023-12-16 MED ORDER — ACETAMINOPHEN 325 MG PO TABS
650.0000 mg | ORAL_TABLET | Freq: Once | ORAL | Status: AC
  Administered 2023-12-16: 650 mg via ORAL
  Filled 2023-12-16: qty 2

## 2023-12-16 MED ORDER — DIPHENHYDRAMINE HCL 25 MG PO CAPS
25.0000 mg | ORAL_CAPSULE | Freq: Once | ORAL | Status: AC
Start: 2023-12-16 — End: 2023-12-16
  Administered 2023-12-16: 25 mg via ORAL
  Filled 2023-12-16: qty 1

## 2023-12-16 NOTE — Progress Notes (Signed)
 [facility] Clinical Social Work  Initial Assessment   Debra [name] is a 68 y.o. year old female contacted by phone. Clinical Social Work was referred by medical provider, [employer], for assessment of psychosocial needs.   SDOH (Social Determinants of Health) assessments performed: Yes   SDOH Screenings   Food Insecurity: No Food Insecurity (11/18/2023)  Housing: Low Risk  (11/18/2023)  Transportation Needs: No Transportation Needs (11/18/2023)  Utilities: Not At Risk (11/18/2023)  Financial Resource Strain: Low Risk  (06/15/2023)   Received from [facility]  Physical Activity: Unknown (12/17/2017)  Social Connections: Moderately Isolated (11/18/2023)  Stress: No Stress Concern Present (12/17/2017)  Tobacco Use: Medium Risk (12/15/2023)     Distress Screen completed: No    01/18/2021   12:30 PM  ONCBCN DISTRESS SCREENING  Screening Type Initial Screening  Distress experienced in past week (1-10) 0      Family/Social Information:  Housing Arrangement: patient lives with her husband and son. Family members/support persons in your life? Family Transportation concerns: no  Employment: Retired  Income source: Actor concerns: No Type of concern: None Food access concerns: no Religious or spiritual practice: Yes-Patient identifies as non-denominational. Advanced directives: No Services Currently in place:  Medicare  Coping/ Adjustment to diagnosis: Patient understands treatment plan and what happens next? yes Concerns about diagnosis and/or treatment: Pain or discomfort during procedures Patient reported stressors: Adjusting to my illness Hopes and/or priorities: To be pain free. Patient enjoys time with family/ friends Current coping skills/ strengths: Capable of independent living , Manufacturing systems engineer , Contractor , General fund of knowledge , and Supportive family/friends     SUMMARY: Current SDOH Barriers:  None per  patient.  Clinical Social Work Clinical Goal(s):  Demonstrate a reduction in symptoms related to :pain.   Interventions: Discussed common feeling and emotions when being diagnosed with cancer, and the importance of support during treatment Informed patient of the support team roles and support services at [facility] Provided CSW contact information and encouraged patient to call with any questions or concerns Provided patient with information about CSW role and the [facility].  Patient reports her primary concern is her pain.  She expressed understanding in contacting her medical team if needed.  She stated she has tried deep breathing and meditation and that the only thing that relieves her pain is medication.   Follow Up Plan: CSW will follow-up with patient by phone  Patient verbalizes understanding of plan: Yes    Kennth Peal, LCSW Clinical Social Worker [facility]

## 2023-12-17 ENCOUNTER — Other Ambulatory Visit: Payer: Self-pay | Admitting: Oncology

## 2023-12-17 LAB — TYPE AND SCREEN
ABO/RH(D): A POS
Antibody Screen: NEGATIVE
Unit division: 0

## 2023-12-17 LAB — BPAM RBC
Blood Product Expiration Date: 202506042359
ISSUE DATE / TIME: 202504301109
Unit Type and Rh: 202506042359
Unit Type and Rh: 6200

## 2023-12-18 ENCOUNTER — Telehealth: Payer: Self-pay | Admitting: Oncology

## 2023-12-18 ENCOUNTER — Encounter: Payer: Self-pay | Admitting: Oncology

## 2023-12-18 ENCOUNTER — Inpatient Hospital Stay: Attending: Hospice and Palliative Medicine | Admitting: Hospice and Palliative Medicine

## 2023-12-18 ENCOUNTER — Ambulatory Visit

## 2023-12-18 DIAGNOSIS — Z171 Estrogen receptor negative status [ER-]: Secondary | ICD-10-CM

## 2023-12-18 DIAGNOSIS — G893 Neoplasm related pain (acute) (chronic): Secondary | ICD-10-CM | POA: Diagnosis not present

## 2023-12-18 DIAGNOSIS — C50211 Malignant neoplasm of upper-inner quadrant of right female breast: Secondary | ICD-10-CM | POA: Insufficient documentation

## 2023-12-18 DIAGNOSIS — C7951 Secondary malignant neoplasm of bone: Secondary | ICD-10-CM | POA: Insufficient documentation

## 2023-12-18 DIAGNOSIS — Z5111 Encounter for antineoplastic chemotherapy: Secondary | ICD-10-CM | POA: Insufficient documentation

## 2023-12-18 DIAGNOSIS — Z170 Estrogen receptor positive status [ER+]: Secondary | ICD-10-CM | POA: Insufficient documentation

## 2023-12-18 DIAGNOSIS — Z515 Encounter for palliative care: Secondary | ICD-10-CM

## 2023-12-18 DIAGNOSIS — Z79633 Long term (current) use of mitotic inhibitor: Secondary | ICD-10-CM | POA: Insufficient documentation

## 2023-12-18 DIAGNOSIS — C50919 Malignant neoplasm of unspecified site of unspecified female breast: Secondary | ICD-10-CM

## 2023-12-18 DIAGNOSIS — C770 Secondary and unspecified malignant neoplasm of lymph nodes of head, face and neck: Secondary | ICD-10-CM | POA: Insufficient documentation

## 2023-12-18 MED ORDER — MORPHINE SULFATE ER 15 MG PO TBCR: 15.0000 mg | EXTENDED_RELEASE_TABLET | Freq: Two times a day (BID) | ORAL | 0 refills | Status: DC

## 2023-12-18 MED ORDER — DEXAMETHASONE 2 MG PO TABS: 2.0000 mg | ORAL_TABLET | Freq: Every day | ORAL | 0 refills | Status: DC

## 2023-12-18 NOTE — Telephone Encounter (Signed)
 Debra [name], pt son called and canceled PET for today.   Pt son stated he would like Dr.Yu to call him and get hospice asap as his mother is hurting really badly.  Pt son was crying on the phone and I did not want to transfer him to a vm to leave a msg. I sent a secure chat to MD, RN, triage, and NP (Josh B.) to see who I could transfer the phone call to.  Dr.Yu was with a pt as clinic is going on. And would like Josh. B, NP to call pt/pt family member since they want hospice.   I let Debra [name], pt son, know the above and that when Debra [name] was here today we would have him call.   Debra [name] stated okay.

## 2023-12-18 NOTE — Progress Notes (Signed)
 Virtual Visit via Telephone Note  I connected with Debra [name] on 12/18/23 at 10:30 AM EDT by telephone and verified that I am speaking with the correct person using two identifiers.  Location: Patient: Home Provider: Clinic   I discussed the limitations, risks, security and privacy concerns of performing an evaluation and management service by telephone and the availability of in person appointments. I also discussed with the patient that there may be a patient responsible charge related to this service. The patient expressed understanding and agreed to proceed.   History of Present Illness: Debra [name] is a 68 y.o. 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  Palliative care consulted to address goals and manage ongoing symptoms.    Observations/Objective: Patient was an add-on to my clinic schedule today to address pain management.  Spoke with patient and son by phone.  Patient endorses acute and severe low back pain, which does not radiate.  No changes in weakness or bowel bladder incontinence.  Patient is taking 1 to 2 tablets of oxycodone  every 6 hours but is not having significant improvement in pain.  In the past, patient has been on MS Contin /oxycodone  and reports that the pain was better controlled.  Son thinks that patient was last on MS Contin  30 mg every 12 hours.  However, that has been several years ago.  Will restart MS Contin  15 mg every 12 hours and liberalize oxycodone  5 to 10 mg every 4 hours as needed for breakthrough pain.  Family to monitor for opioid-induced constipation and adjust bowel regimen accordingly.  Will also trial low-dose dexamethasone  for possible inflammatory nature of pain.  Patient with pending PET scan today but canceled due to pain.  I would like to reschedule PET scan for soon as possible to evaluate back pain and explore  any appropriate interventional options.  Discussed goals with patient and son.  Patient has talked with family about possibly foregoing chemotherapy and pursuing hospice instead.  However, today patient states she is undecided.  She is in agreement to rescheduling PET scan and then discussing further regarding goals.    Patient/son states that she would not want to be resuscitated or have her life prolonged artificial machines.  In agreement DNR/DNI.  Assessment and Plan: Stage IV triple negative breast cancer -will reschedule PET scan.  Patient sees Dr. Wilhelmenia Harada next week.  Neoplasm related pain -start MS Contin  15 mg every 12 hours #30, liberalize oxycodone  5 to 10 mg every 4 hours as needed for breakthrough pain (son thinks patient has enough quantity to last through the weekend).  Start dexamethasone  2 mg daily.  Reschedule PET scan for further evaluation.  GOC -patient undecided on whether to pursue treatment versus hospice.  Will have further conversations regarding goals after PET scan.  DNR/DNI.  Case and plan discussed with Dr. Wilhelmenia Harada  Follow Up Instructions: Next week   I discussed the assessment and treatment plan with the patient. The patient was provided an opportunity to ask questions and all were answered. The patient agreed with the plan and demonstrated an understanding of the instructions.   The patient was advised to call back or seek an in-person evaluation if the symptoms worsen or if the condition fails to improve as anticipated.  I provided 20 minutes of non-face-to-face time during this encounter.   Peggyann Bower, NP

## 2023-12-21 ENCOUNTER — Telehealth: Payer: Self-pay | Admitting: Oncology

## 2023-12-21 ENCOUNTER — Telehealth: Payer: Self-pay | Admitting: *Deleted

## 2023-12-21 ENCOUNTER — Other Ambulatory Visit: Payer: Medicare Other

## 2023-12-21 ENCOUNTER — Ambulatory Visit
Admission: RE | Admit: 2023-12-21 | Discharge: 2023-12-21 | Disposition: A | Discharge status: Home / Self Care | Source: Ambulatory Visit | Attending: Oncology | Admitting: Oncology

## 2023-12-21 DIAGNOSIS — C7951 Secondary malignant neoplasm of bone: Secondary | ICD-10-CM | POA: Insufficient documentation

## 2023-12-21 DIAGNOSIS — C787 Secondary malignant neoplasm of liver and intrahepatic bile duct: Secondary | ICD-10-CM | POA: Insufficient documentation

## 2023-12-21 DIAGNOSIS — J90 Pleural effusion, not elsewhere classified: Secondary | ICD-10-CM | POA: Insufficient documentation

## 2023-12-21 DIAGNOSIS — C50919 Malignant neoplasm of unspecified site of unspecified female breast: Secondary | ICD-10-CM | POA: Insufficient documentation

## 2023-12-21 LAB — GLUCOSE, CAPILLARY: Glucose-Capillary: 133 mg/dL — ABNORMAL HIGH (ref 70–99)

## 2023-12-21 MED ORDER — FLUDEOXYGLUCOSE F - 18 (FDG) INJECTION
9.7800 | Freq: Once | INTRAVENOUS | Status: AC | PRN
  Administered 2023-12-21: 9.78 via INTRAVENOUS

## 2023-12-21 NOTE — Telephone Encounter (Signed)
 Dr. Wilhelmenia Harada refilled rx on 5/1 with 5 refills. Called and left VM letting them know that 5 refills are left and they will need to contact pharmacy to fill.

## 2023-12-21 NOTE — Telephone Encounter (Signed)
 Husband called and said he needs a refill for his wife the enoxaparin  they only have enough for today, wanted them to get it sent in so that they would pick it up today

## 2023-12-21 NOTE — Telephone Encounter (Signed)
 Debra [name] called on behalf of his wife Debra [name] to inquire about her appt. I went over all appts including PET scan today and what was required. I then transferred him to Triage so he could ask for a refill on a blood thinner injection.

## 2023-12-22 ENCOUNTER — Inpatient Hospital Stay

## 2023-12-22 ENCOUNTER — Telehealth: Payer: Self-pay

## 2023-12-22 ENCOUNTER — Other Ambulatory Visit

## 2023-12-22 ENCOUNTER — Inpatient Hospital Stay: Admitting: Hospice and Palliative Medicine

## 2023-12-22 ENCOUNTER — Encounter: Payer: Self-pay | Admitting: Oncology

## 2023-12-22 ENCOUNTER — Inpatient Hospital Stay (HOSPITAL_BASED_OUTPATIENT_CLINIC_OR_DEPARTMENT_OTHER): Admitting: Oncology

## 2023-12-22 VITALS — BP 125/60 | HR 88 | Resp 16

## 2023-12-22 VITALS — BP 142/75 | HR 118 | Temp 97.0°F | Resp 16 | Wt 173.0 lb

## 2023-12-22 DIAGNOSIS — E876 Hypokalemia: Secondary | ICD-10-CM | POA: Diagnosis not present

## 2023-12-22 DIAGNOSIS — C50211 Malignant neoplasm of upper-inner quadrant of right female breast: Secondary | ICD-10-CM | POA: Diagnosis present

## 2023-12-22 DIAGNOSIS — C50919 Malignant neoplasm of unspecified site of unspecified female breast: Secondary | ICD-10-CM

## 2023-12-22 DIAGNOSIS — G893 Neoplasm related pain (acute) (chronic): Secondary | ICD-10-CM

## 2023-12-22 DIAGNOSIS — R112 Nausea with vomiting, unspecified: Secondary | ICD-10-CM

## 2023-12-22 DIAGNOSIS — C7951 Secondary malignant neoplasm of bone: Secondary | ICD-10-CM | POA: Diagnosis not present

## 2023-12-22 DIAGNOSIS — Z515 Encounter for palliative care: Secondary | ICD-10-CM

## 2023-12-22 DIAGNOSIS — Z5111 Encounter for antineoplastic chemotherapy: Secondary | ICD-10-CM | POA: Diagnosis present

## 2023-12-22 DIAGNOSIS — C770 Secondary and unspecified malignant neoplasm of lymph nodes of head, face and neck: Secondary | ICD-10-CM | POA: Diagnosis present

## 2023-12-22 DIAGNOSIS — I6310 Cerebral infarction due to embolism of unspecified precerebral artery: Secondary | ICD-10-CM | POA: Diagnosis not present

## 2023-12-22 DIAGNOSIS — Z170 Estrogen receptor positive status [ER+]: Secondary | ICD-10-CM | POA: Diagnosis not present

## 2023-12-22 DIAGNOSIS — Z79633 Long term (current) use of mitotic inhibitor: Secondary | ICD-10-CM | POA: Diagnosis not present

## 2023-12-22 LAB — CBC WITH DIFFERENTIAL (CANCER CENTER ONLY)
Abs Immature Granulocytes: 0.43 10*3/uL — ABNORMAL HIGH (ref 0.00–0.07)
Basophils Absolute: 0 10*3/uL (ref 0.0–0.1)
Basophils Relative: 0 %
Eosinophils Absolute: 0.1 10*3/uL (ref 0.0–0.5)
Eosinophils Relative: 1 %
HCT: 29.1 % — ABNORMAL LOW (ref 36.0–46.0)
Hemoglobin: 9.5 g/dL — ABNORMAL LOW (ref 12.0–15.0)
Immature Granulocytes: 4 %
Lymphocytes Relative: 13 %
Lymphs Abs: 1.4 10*3/uL (ref 0.7–4.0)
MCH: 30.2 pg (ref 26.0–34.0)
MCHC: 32.6 g/dL (ref 30.0–36.0)
MCV: 92.4 fL (ref 80.0–100.0)
Monocytes Absolute: 0.9 10*3/uL (ref 0.1–1.0)
Monocytes Relative: 8 %
Neutro Abs: 8.6 10*3/uL — ABNORMAL HIGH (ref 1.7–7.7)
Neutrophils Relative %: 74 %
Platelet Count: 332 10*3/uL (ref 150–400)
RBC: 3.15 MIL/uL — ABNORMAL LOW (ref 3.87–5.11)
RDW: 15.3 % (ref 11.5–15.5)
WBC Count: 11.5 10*3/uL — ABNORMAL HIGH (ref 4.0–10.5)
nRBC: 0.3 % — ABNORMAL HIGH (ref 0.0–0.2)

## 2023-12-22 LAB — CMP (CANCER CENTER ONLY)
ALT: 24 U/L (ref 0–44)
AST: 25 U/L (ref 15–41)
Albumin: 2.7 g/dL — ABNORMAL LOW (ref 3.5–5.0)
Alkaline Phosphatase: 87 U/L (ref 38–126)
Anion gap: 12 (ref 5–15)
BUN: 24 mg/dL — ABNORMAL HIGH (ref 8–23)
CO2: 27 mmol/L (ref 22–32)
Calcium: 9.4 mg/dL (ref 8.9–10.3)
Chloride: 96 mmol/L — ABNORMAL LOW (ref 98–111)
Creatinine: 0.83 mg/dL (ref 0.44–1.00)
GFR, Estimated: >60 mL/min (ref >60)
Glucose, Bld: 123 mg/dL — ABNORMAL HIGH (ref 70–99)
Potassium: 4 mmol/L (ref 3.5–5.1)
Sodium: 135 mmol/L (ref 135–145)
Total Bilirubin: 0.8 mg/dL (ref 0.0–1.2)
Total Protein: 7.4 g/dL (ref 6.5–8.1)

## 2023-12-22 LAB — MAGNESIUM: Magnesium: 1.7 mg/dL (ref 1.7–2.4)

## 2023-12-22 MED ORDER — FAMOTIDINE IN NACL 20-0.9 MG/50ML-% IV SOLN
20.0000 mg | Freq: Once | INTRAVENOUS | Status: AC
  Administered 2023-12-22: 20 mg via INTRAVENOUS
  Filled 2023-12-22: qty 50

## 2023-12-22 MED ORDER — SODIUM CHLORIDE 0.9 % IV SOLN
INTRAVENOUS | Status: DC
  Filled 2023-12-22: qty 250

## 2023-12-22 MED ORDER — SODIUM CHLORIDE 0.9 % IV SOLN
6.2500 mg | Freq: Once | INTRAVENOUS | Status: AC
  Administered 2023-12-22: 6.25 mg via INTRAVENOUS
  Filled 2023-12-22: qty 0.25

## 2023-12-22 MED ORDER — DIPHENHYDRAMINE HCL 50 MG/ML IJ SOLN
50.0000 mg | Freq: Once | INTRAMUSCULAR | Status: AC
  Administered 2023-12-22: 50 mg via INTRAVENOUS
  Filled 2023-12-22: qty 1

## 2023-12-22 MED ORDER — OXYCODONE HCL 10 MG PO TABS
5.0000 mg | ORAL_TABLET | ORAL | 0 refills | Status: DC | PRN
Start: 2023-12-22 — End: 2024-01-19

## 2023-12-22 MED ORDER — SODIUM CHLORIDE 0.9% FLUSH
10.0000 mL | INTRAVENOUS | Status: DC | PRN
  Administered 2023-12-22: 10 mL
  Filled 2023-12-22: qty 10

## 2023-12-22 MED ORDER — SODIUM CHLORIDE 0.9 % IV SOLN
80.0000 mg/m2 | Freq: Once | INTRAVENOUS | Status: AC
  Administered 2023-12-22: 150 mg via INTRAVENOUS
  Filled 2023-12-22: qty 25

## 2023-12-22 MED ORDER — HEPARIN SOD (PORK) LOCK FLUSH 100 UNIT/ML IV SOLN
500.0000 [IU] | Freq: Once | INTRAVENOUS | Status: AC | PRN
  Administered 2023-12-22: 500 [IU]
  Filled 2023-12-22: qty 5

## 2023-12-22 MED ORDER — DEXAMETHASONE SODIUM PHOSPHATE 10 MG/ML IJ SOLN
10.0000 mg | Freq: Once | INTRAMUSCULAR | Status: AC
  Administered 2023-12-22: 10 mg via INTRAVENOUS
  Filled 2023-12-22: qty 1

## 2023-12-22 MED ORDER — SODIUM CHLORIDE 0.9 % IV SOLN
Freq: Once | INTRAVENOUS | Status: AC
  Filled 2023-12-22: qty 250

## 2023-12-22 NOTE — Assessment & Plan Note (Signed)
 Continue MS Contin  15 mg twice daily, oxycodone  5 to 10 mg every 4 hours as needed for pain.

## 2023-12-22 NOTE — Assessment & Plan Note (Signed)
 Right breast cT2 cN0 invasive carcinoma, ER/PR positive, HER2 positive -->neoadjuvant chemo S/p TCH x2, and TCHP x4 followed by lumpectomy and sentinel lymph node biopsy. ypT1a ypN0- Residual disease after neoadjuvant chemotherapy, status post adjuvant radiation S/p 1 year of Kadcyla  --> 10/2023 CT/MRI showed recurrence --> biopsy showed metastatic adenocarcinoma, breast origin. Triple negative.  Images were reviewed and discussed with patient.  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  Obtain PET scan-result is pending. Overall she tolerated first dose of Taxol  with mild difficulties. Lab results reviewed with patient. Proceed with Taxol  today.  Send off NGS, PD-L1, if >=10, consider add pembrolizumab.

## 2023-12-22 NOTE — Patient Instructions (Signed)

## 2023-12-22 NOTE — Progress Notes (Signed)
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 were continuously monitored during the procedure by the interventional radiology nurse under my direct supervision. FLUOROSCOPY: Radiation exposure index: 1 mGy reference air kerma COMPLICATIONS: None immediate. PROCEDURE: The right neck and chest was prepped with chlorhexidine , and draped in the usual sterile fashion using maximum barrier technique (cap and mask, sterile gown, sterile gloves, large sterile sheet, hand hygiene and cutaneous antiseptic). Local anesthesia was attained by infiltration with 1% lidocaine  with epinephrine . Ultrasound demonstrated patency of the right internal jugular vein, and this was documented with an image. Under real-time ultrasound guidance, this vein was accessed with a 21 gauge micropuncture needle and image documentation was performed. A small dermatotomy was made at the access site with an 11 scalpel. A 0.018" wire was advanced into the SVC and the access needle  exchanged for a 3F micropuncture vascular sheath. The 0.018" wire was then removed and a 0.035" wire advanced into the IVC. An appropriate location for the subcutaneous reservoir was selected below the clavicle and an incision was made through the skin and underlying soft tissues. The subcutaneous tissues were then dissected using a combination of blunt and sharp surgical technique and a pocket was formed. A Bard Clear Vue single lumen power injectable portacatheter was then tunneled through the subcutaneous tissues from the pocket to the dermatotomy and the port reservoir placed within the subcutaneous pocket. The venous access site was then serially dilated and a peel away vascular sheath placed over the wire. The wire was removed and the port catheter advanced into position under fluoroscopic guidance. The catheter tip is positioned in the upper right atrium. This was documented with a spot image. The portacatheter was then tested and found to flush and aspirate well. The port was flushed with saline followed by 100 units/mL heparinized saline. The pocket was then closed in two layers using first subdermal inverted interrupted absorbable sutures followed by a running subcuticular suture. The epidermis was then sealed with Dermabond. The dermatotomy at the venous access site was also closed with Dermabond. IMPRESSION: Successful placement of a right IJ approach Bard Clear Vue port catheter with ultrasound and fluoroscopic guidance. The catheter is ready for use. Electronically Signed   By: Fernando Hoyer  M.D.   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 Recent biopsy of a right supraclavicular lymph node but additional tissue is needed for diagnostic purposes. EXAM: ULTRASOUND-GUIDED CORE BIOPSY OF RIGHT SUPRACLAVICULAR LYMPH NODE MEDICATIONS: Moderate sedation ANESTHESIA/SEDATION: Moderate (conscious) sedation was  employed during this procedure. A total of Versed  1 mg and Fentanyl  50 mcg was administered intravenously by the radiology nurse. Total intra-service moderate Sedation Time: 15 minutes. The patient's level of consciousness and vital signs were monitored continuously by radiology nursing throughout the procedure under my direct supervision. FLUOROSCOPY TIME:  None COMPLICATIONS: None immediate. PROCEDURE: Informed written consent was obtained from the patient after a thorough discussion of the procedural risks, benefits and alternatives. All questions were addressed. A timeout was performed prior to the initiation of the procedure. Ultrasound was used to identify enlarged right supraclavicular lymph nodes. Right supraclavicular area was prepped with chlorhexidine  and sterile field was created. Skin was anesthetized with 1% lidocaine . Small incision was made. Using ultrasound guidance, 17 gauge coaxial needle was directed into a supraclavicular lymph node. Total of 5 core biopsies were obtained with an 18 gauge core device. Specimens placed in formalin. 17 gauge coaxial needle was removed. Bandage placed over the puncture site. FINDINGS: Multiple enlarged right supraclavicular lymph nodes. Core biopsy needle confirmed within the supraclavicular lymph node. No immediate bleeding or hematoma formation. IMPRESSION: Successful ultrasound-guided core biopsy of an enlarged right supraclavicular lymph node. Electronically Signed   By: Elene Griffes M.D.   On: 11/19/2023 16:16   ECHOCARDIOGRAM LIMITED Result Date: 11/18/2023    ECHOCARDIOGRAM LIMITED REPORT   Patient Name:   Debra [name] Date of Exam: 11/17/2023 Medical Rec #:  [id number]    Height:       64.0 in Accession #:    [id number]   Weight:       179.9 lb Date of Birth:  1956/06/14     BSA:          1.870 m Patient Age:    82 years     BP:           114/88 mmHg Patient Gender: F            HR:           84 bpm. Exam Location:  [facility] Procedure: 2D Echo, Limited Echo,  Limited Color Doppler and Cardiac Doppler            (Both Spectral and Color Flow Doppler were utilized during            procedure). Indications:     Elevated Troponin  History:         Patient has prior history of Echocardiogram examinations, most                  recent 11/11/2023. COPD, Arrythmias:Atrial Fibrillation; Risk                  Factors:Hypertension and Dyslipidemia. Obstructive sleep apnea.                  Fatigue. Breast Cancer.  Sonographer:     Brigid Canada RDCS Referring Phys:  [id number] RYAN M DUNN Diagnosing Phys: Sammy Crisp MD IMPRESSIONS  1. Left ventricular ejection fraction, by estimation, is 60 to 65%. The left ventricle has normal function. The left ventricle has no regional wall motion abnormalities. Left ventricular diastolic parameters were normal.  2. Right ventricular systolic  function is normal. The right ventricular size is normal. Tricuspid regurgitation signal is inadequate for assessing PA pressure.  3. The mitral valve is degenerative. At least moderate mitral valve regurgitation, which may be underestimated due to jet eccentricity. No evidence of mitral stenosis.  4. Aortic valve regurgitation is not visualized. No aortic stenosis is present.  5. The inferior vena cava is normal in size with greater than 50% respiratory variability, suggesting right atrial pressure of 3 mmHg. FINDINGS  Left Ventricle: Left ventricular ejection fraction, by estimation, is 60 to 65%. The left ventricle has normal function. The left ventricle has no regional wall motion abnormalities. The left ventricular internal cavity size was normal in size. There is  no left ventricular hypertrophy. Left ventricular diastolic parameters were normal. Right Ventricle: The right ventricular size is normal. No increase in right ventricular wall thickness. Right ventricular systolic function is normal. Tricuspid regurgitation signal is inadequate for assessing PA pressure. Pericardium: There is no  evidence of pericardial effusion. Mitral Valve: The mitral valve is degenerative in appearance. There is moderate thickening of the mitral valve leaflet(s). Moderate mitral valve regurgitation, with wall-impinging jet. No evidence of mitral valve stenosis. Tricuspid Valve: The tricuspid valve is normal in structure. Tricuspid valve regurgitation is trivial. Aortic Valve: Aortic valve regurgitation is not visualized. No aortic stenosis is present. Aortic valve mean gradient measures 6.0 mmHg. Aortic valve peak gradient measures 13.5 mmHg. Aorta: The aortic root is normal in size and structure. Venous: The inferior vena cava is normal in size with greater than 50% respiratory variability, suggesting right atrial pressure of 3 mmHg. LEFT VENTRICLE PLAX 2D LVIDd:         5.20 cm Diastology LVIDs:         2.80 cm LV e' medial:    12.35 cm/s LV PW:         0.70 cm LV E/e' medial:  8.9 LV IVS:        0.70 cm LV e' lateral:   15.75 cm/s                        LV E/e' lateral: 7.0  RIGHT VENTRICLE             IVC RV S prime:     19.85 cm/s  IVC diam: 1.60 cm LEFT ATRIUM         Index LA diam:    3.70 cm 1.98 cm/m  AORTIC VALVE AV Vmax:      183.59 cm/s AV Vmean:     114.120 cm/s AV VTI:       0.317 m AV Peak Grad: 13.5 mmHg AV Mean Grad: 6.0 mmHg  AORTA Ao Root diam: 3.40 cm MITRAL VALVE MV Area (PHT): 3.63 cm MV VTI:        0.40 m MV Decel Time: 209 msec MV E velocity: 109.67 cm/s MV A velocity: 142.67 cm/s MV E/A ratio:  0.77 Veryl Gottron End MD Electronically signed by Sammy Crisp MD Signature Date/Time: 11/18/2023/5:49:00 AM    Final    MR BRAIN WO CONTRAST Result Date: 11/17/2023 CLINICAL DATA:  Provided history: Neuro deficit, acute, stroke suspected. Possible new versus progressed CVA on CTA. Weakness. Aphasia. Facial droop. EXAM: MRI HEAD WITHOUT CONTRAST TECHNIQUE: Multiplanar, multiecho pulse sequences of the brain and surrounding structures were obtained without intravenous contrast. COMPARISON:  Head CT  11/17/2023. CT angiogram head/neck 11/11/2023. Brain MRI 11/11/2023. FINDINGS: Brain: No age-advanced or lobar predominant cerebral atrophy. Numerous small  acute infarcts within the left cerebellar hemisphere and left superior cerebellar peduncle, the majority of which are new from the prior MRI of 11/11/2023. Several new small acute cortical infarcts are present within the right frontal, right parietal, left parietal and left occipital lobes. A new punctate acute infarct is also present within the left thalamus. Known patchy acute/early subacute infarcts elsewhere within the left frontal and left parietal lobes (MCA vascular territory). 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 Background mild multifocal T2 FLAIR hyperintense signal abnormality within the cerebral white matter, nonspecific but compatible with chronic small vessel ischemic disease. Punctate chronic microhemorrhage within the left occipital lobe, unchanged (series 10, image 25). No evidence of an intracranial mass. No extra-axial fluid collection. No midline shift. Vascular: Please see the recent prior CTA head/neck 11/11/2023. Small foci of susceptibility-weighted signal loss along the left frontal lobe, new from the prior MRI and suspicious for emboli within distal left MCA branches (series 10, image 46). Skull and upper cervical spine: No focal worrisome marrow lesion. Incompletely assessed cervical spondylosis. Sinuses/Orbits: No mass or acute finding within the imaged orbits. Prior bilateral ocular lens replacement. Minimal mucosal thickening within the right maxillary sinus. IMPRESSION: 1. Numerous small acute infarcts within the left cerebellar hemisphere/superior cerebellar peduncle, the majority of which are new from the prior brain MRI of 11/11/2023. Additionally, there are several new small acute cortical infarcts within the right frontal, right parietal, left parietal and left occipital lobes, as well as a new  punctate acute infarct within the left thalamus. Involvement of multiple vascular territories concerning for an embolic process. New small foci of signal abnormality along the left frontal lobe suspicious for emboli within distal left middle cerebral artery branches. 2. Known patchy acute/early subacute infarcts elsewhere within the left frontal and left parietal lobes (MCA vascular territory). 3. Background mild cerebral white matter chronic small vessel ischemic disease. 4. Unchanged chronic lacunar infarct within the right caudate nucleus. Electronically Signed   By: Bascom Lily D.O.   On: 11/17/2023 16:31   DG Chest Portable 1 View Result Date: 11/17/2023 CLINICAL DATA:  Vomiting.  Weakness. EXAM: PORTABLE CHEST 1 VIEW COMPARISON:  11/10/2023 FINDINGS: Bilateral lung fields are clear. Bilateral costophrenic angles are clear. Stable cardio-mediastinal silhouette. No acute osseous abnormalities. The soft tissues are within normal limits. IMPRESSION: No active disease. Electronically Signed   By: Beula Brunswick M.D.   On: 11/17/2023 11:49   CT Head Wo Contrast Result Date: 11/17/2023 CLINICAL DATA:  68 year old female neurologic deficit with slurred speech, right facial droop. Recent left MCA M2 occlusion, posterior left MCA infarcts, occasional other small scattered brain infarcts on CTA, MRI. EXAM: CT HEAD WITHOUT CONTRAST TECHNIQUE: Contiguous axial images were obtained from the base of the skull through the vertex without intravenous contrast. RADIATION DOSE REDUCTION: This exam was performed according to the departmental dose-optimization program which includes automated exposure control, adjustment of the mA and/or kV according to patient size and/or use of iterative reconstruction technique. COMPARISON:  Brain MRI 11/11/2023 and earlier FINDINGS: Brain: Posterior left MCA infarcts on MRI remain subtle by CT with no associated hemorrhage or mass effect (series 3, image 20). Small left cerebellum PICA  territory infarct is now apparent on CT coronal image 52, and appears different from that on the recent MRI. No associated hemorrhage or mass effect. No acute intracranial hemorrhage identified. No midline shift, mass effect, or evidence of intracranial mass lesion. No ventriculomegaly. Vascular: Calcified atherosclerosis at the skull base. No suspicious  intracranial vascular hyperdensity. Skull: Stable and intact. Sinuses/Orbits: Visualized paranasal sinuses and mastoids are stable and well aerated. Other: Mild rightward gaze. No other acute orbit or scalp soft tissue finding. IMPRESSION: 1. New or progressed Left cerebellar PICA territory infarct since the MRI on 11/11/2023. 2. Stable CT appearance of posterior left MCA territory infarcts. 3. No associated acute intracranial hemorrhage or mass effect. Electronically Signed   By: Marlise Simpers M.D.   On: 11/17/2023 10:15   US  CORE BIOPSY (LYMPH NODES) Result Date: 11/16/2023 INDICATION: Right lower lobe lung mass, lymphadenopathy and liver lesions. Enlarged right supraclavicular lymph node targeted for biopsy. EXAM: ULTRASOUND GUIDED CORE BIOPSY OF RIGHT SUPRACLAVICULAR LYMPH NODE MEDICATIONS: None. ANESTHESIA/SEDATION: Moderate (conscious) sedation was employed during this procedure. A total of Versed  1.5 mg and Fentanyl  75 mcg was administered intravenously. Moderate Sedation Time: 19 minutes. The patient's level of consciousness and vital signs were monitored continuously by radiology nursing throughout the procedure under my direct supervision. PROCEDURE: The procedure, risks, benefits, and alternatives were explained to the patient. Questions regarding the procedure were encouraged and answered. The patient understands and consents to the procedure. A time-out was performed prior to initiating the procedure. The right neck was prepped with chlorhexidine  in a sterile fashion, and a sterile drape was applied covering the operative field. A sterile gown and sterile  gloves were used for the procedure. Local anesthesia was provided with 1% Lidocaine . After localizing an enlarged right supraclavicular lymph node, 18 gauge core biopsy samples were obtained through different portions of the lymph node. A total of 5 samples were obtained with 3 submitted on saline soaked Telfa gauze and 2 in formalin. Additional ultrasound was performed after biopsy. COMPLICATIONS: None immediate. FINDINGS: Several adjacent right supraclavicular lymph nodes are identified by ultrasound. The largest measures approximately 2.2 x 1.4 x 1.7 cm. Solid core biopsy samples were obtained. IMPRESSION: Ultrasound-guided core biopsy performed of an enlarged right supraclavicular lymph node measuring 2.2 cm in greatest diameter by ultrasound. Electronically Signed   By: Erica Hau M.D.   On: 11/16/2023 16:02   ECHOCARDIOGRAM COMPLETE Result Date: 11/12/2023    ECHOCARDIOGRAM REPORT   Patient Name:   Debra [name] Date of Exam: 11/11/2023 Medical Rec #:  [id number]    Height:       64.0 in Accession #:    [id number]   Weight:       180.0 lb Date of Birth:  02-12-1956     BSA:          1.871 m Patient Age:    63 years     BP:           110/92 mmHg Patient Gender: F            HR:           80 bpm. Exam Location:  [facility] Procedure: 2D Echo, Cardiac Doppler and Color Doppler (Both Spectral and Color            Flow Doppler were utilized during procedure). Indications:     Elevated Troponin  History:         Patient has prior history of Echocardiogram examinations, most                  recent 04/25/2022. COPD, Arrythmias:Atrial Fibrillation; Risk                  Factors:Hypertension and Dyslipidemia. Obstructive sleep apnea.  Sonographer:     Cheri Coria Rodgers-Jones RDCS Referring  Phys:  [id number] Haynes Lips BJYN Diagnosing Phys: Belva Boyden MD IMPRESSIONS  1. Left ventricular ejection fraction, by estimation, is 60 to 65%. The left ventricle has normal function. The left ventricle has no regional wall motion  abnormalities. There is mild left ventricular hypertrophy. Left ventricular diastolic parameters are consistent with Grade I diastolic dysfunction (impaired relaxation).  2. Right ventricular systolic function is normal. The right ventricular size is normal. There is normal pulmonary artery systolic pressure. The estimated right ventricular systolic pressure is 21.3 mmHg.  3. The mitral valve is normal in structure. Mild to moderate mitral valve regurgitation. No evidence of mitral stenosis.  4. The aortic valve is normal in structure. Aortic valve regurgitation is not visualized. No aortic stenosis is present.  5. The inferior vena cava is normal in size with greater than 50% respiratory variability, suggesting right atrial pressure of 3 mmHg. FINDINGS  Left Ventricle: Left ventricular ejection fraction, by estimation, is 60 to 65%. The left ventricle has normal function. The left ventricle has no regional wall motion abnormalities. Strain was performed and the global longitudinal strain is indeterminate. The left ventricular internal cavity size was normal in size. There is mild left ventricular hypertrophy. Left ventricular diastolic parameters are consistent with Grade I diastolic dysfunction (impaired relaxation). Right Ventricle: The right ventricular size is normal. No increase in right ventricular wall thickness. Right ventricular systolic function is normal. There is normal pulmonary artery systolic pressure. The tricuspid regurgitant velocity is 2.02 m/s, and  with an assumed right atrial pressure of 5 mmHg, the estimated right ventricular systolic pressure is 21.3 mmHg. Left Atrium: Left atrial size was normal in size. Right Atrium: Right atrial size was normal in size. Pericardium: There is no evidence of pericardial effusion. Mitral Valve: The mitral valve is normal in structure. Mild to moderate mitral valve regurgitation. No evidence of mitral valve stenosis. Tricuspid Valve: The tricuspid valve is  normal in structure. Tricuspid valve regurgitation is not demonstrated. No evidence of tricuspid stenosis. Aortic Valve: The aortic valve is normal in structure. Aortic valve regurgitation is not visualized. No aortic stenosis is present. Pulmonic Valve: The pulmonic valve was normal in structure. Pulmonic valve regurgitation is not visualized. No evidence of pulmonic stenosis. Aorta: The aortic root is normal in size and structure. Venous: The inferior vena cava is normal in size with greater than 50% respiratory variability, suggesting right atrial pressure of 3 mmHg. IAS/Shunts: No atrial level shunt detected by color flow Doppler. Additional Comments: 3D was performed not requiring image post processing on an independent workstation and was indeterminate.  LEFT VENTRICLE PLAX 2D LVIDd:         4.90 cm   Diastology LVIDs:         3.10 cm   LV e' medial:    9.36 cm/s LV PW:         1.00 cm   LV E/e' medial:  9.1 LV IVS:        1.00 cm   LV e' lateral:   9.32 cm/s LVOT diam:     2.00 cm   LV E/e' lateral: 9.2 LV SV:         56 LV SV Index:   30 LVOT Area:     3.14 cm  RIGHT VENTRICLE RV Basal diam:  3.30 cm RV S prime:     18.33 cm/s TAPSE (M-mode): 3.2 cm LEFT ATRIUM             Index  RIGHT ATRIUM           Index LA diam:        3.90 cm 2.08 cm/m   RA Area:     11.60 cm LA Vol (A2C):   50.9 ml 27.21 ml/m  RA Volume:   27.60 ml  14.75 ml/m LA Vol (A4C):   38.2 ml 20.42 ml/m LA Biplane Vol: 44.4 ml 23.73 ml/m  AORTIC VALVE LVOT Vmax:   98.00 cm/s LVOT Vmean:  64.000 cm/s LVOT VTI:    0.179 m  AORTA Ao Root diam: 3.30 cm MITRAL VALVE                TRICUSPID VALVE MV Area (PHT): 3.65 cm     TR Peak grad:   16.3 mmHg MV Decel Time: 208 msec     TR Vmax:        202.00 cm/s MR Peak grad: 82.4 mmHg MR Vmax:      454.00 cm/s   SHUNTS MV E velocity: 85.55 cm/s   Systemic VTI:  0.18 m MV A velocity: 123.50 cm/s  Systemic Diam: 2.00 cm MV E/A ratio:  0.69 Belva Boyden MD Electronically signed by Belva Boyden MD Signature Date/Time: 11/12/2023/1:59:01 PM    Final    MR ABDOMEN MRCP W WO CONTAST Result Date: 11/12/2023 CLINICAL DATA:  Evaluate liver lesions.  Chest mass. EXAM: MRI ABDOMEN WITHOUT AND WITH CONTRAST (INCLUDING MRCP) TECHNIQUE: Multiplanar multisequence MR imaging of the abdomen was performed both before and after the administration of intravenous contrast. Heavily T2-weighted images of the biliary and pancreatic ducts were obtained, and three-dimensional MRCP images were rendered by post processing. CONTRAST:  8mL GADAVIST  GADOBUTROL  1 MMOL/ML IV SOLN COMPARISON:  CT AP 11/10/2023 FINDINGS: Lower chest: Small right pleural effusion. Partially visualized mass within the superior segment of right lower lobe noted. Hepatobiliary: There are multifocal bilobar, ill-defined areas of mild increased T2 signal noted within both lobes of liver which show hypoenhancement on the postcontrast images and restricted diffusion. At least 5 lesions are noted involving both lobes. The largest lesion is in segment 5 measuring 1.1 cm, image 17/8. Lesion in segment 2/3 measures 6 mm, image 10/8. Status post cholecystectomy. Mild intrahepatic bile duct dilatation. Fusiform dilatation of the common bile duct which measures 1.3 cm proximally. No signs of choledocholithiasis or mass. Pancreas:  No main duct dilatation, inflammation or mass identified. Spleen: Within the proximal portion of the spleen there are 2 wedge-shaped areas of peripheral hypoenhancement, image 25/31. Adrenals/Urinary Tract: There is a 1.3 cm nodule in the left adrenal gland which is indeterminate with only mild loss of signal on the out of phase sequences, image 29/9. This is a new finding when compared with the remote CT of the abdomen pelvis from 09/17/2020. Normal right adrenal gland. No kidney mass or signs of obstructive uropathy. Stomach/Bowel: Visualized portions within the abdomen are unremarkable. Vascular/Lymphatic: No aneurysm. Aortic  atherosclerosis. No adenopathy. Other:  No ascites. Musculoskeletal: Multifocal abnormal areas of enhancement are identified throughout the visualized portions of the thoracic and lumbar spine which are concerning for osseous metastasis. IMPRESSION: 1. There are multifocal bilobar, ill-defined areas of mild increased T2 signal noted within both lobes of liver which show hypoenhancement on the postcontrast images and restricted diffusion. At least 5 lesions are noted involving both lobes. 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 2. Multifocal abnormal areas of enhancement are identified throughout the visualized portions of the thoracic and lumbar spine which are concerning for osseous  metastasis. 3. There are 2 wedge-shaped areas of peripheral hypoenhancement within the proximal portion of the spleen which are concerning for splenic infarcts. 4. There is a 1.3 cm nodule in the left adrenal gland which is indeterminate with only mild loss of signal on the out of phase sequences. This is a new finding when compared with the remote CT of the abdomen pelvis from 09/17/2020. Differential considerations include lipid poor adenoma versus a adrenal metastasis. 5. Small right pleural effusion. 6. Partially visualized mass within the superior segment of right lower lobe. 7. Status post cholecystectomy. Mild intrahepatic bile duct dilatation. Fusiform dilatation of the common bile duct which measures 1.3 cm proximally. No signs of choledocholithiasis or mass. Electronically Signed   By: Kimberley Penman M.D.   On: 11/12/2023 05:07   MR 3D Recon At Scanner Result Date: 11/12/2023 CLINICAL DATA:  Evaluate liver lesions.  Chest mass. EXAM: MRI ABDOMEN WITHOUT AND WITH CONTRAST (INCLUDING MRCP) TECHNIQUE: Multiplanar multisequence MR imaging of the abdomen was performed both before and after the administration of intravenous contrast. Heavily T2-weighted images of the biliary and pancreatic ducts were obtained, and  three-dimensional MRCP images were rendered by post processing. CONTRAST:  8mL GADAVIST  GADOBUTROL  1 MMOL/ML IV SOLN COMPARISON:  CT AP 11/10/2023 FINDINGS: Lower chest: Small right pleural effusion. Partially visualized mass within the superior segment of right lower lobe noted. Hepatobiliary: There are multifocal bilobar, ill-defined areas of mild increased T2 signal noted within both lobes of liver which show hypoenhancement on the postcontrast images and restricted diffusion. At least 5 lesions are noted involving both lobes. The largest lesion is in segment 5 measuring 1.1 cm, image 17/8. Lesion in segment 2/3 measures 6 mm, image 10/8. Status post cholecystectomy. Mild intrahepatic bile duct dilatation. Fusiform dilatation of the common bile duct which measures 1.3 cm proximally. No signs of choledocholithiasis or mass. Pancreas:  No main duct dilatation, inflammation or mass identified. Spleen: Within the proximal portion of the spleen there are 2 wedge-shaped areas of peripheral hypoenhancement, image 25/31. Adrenals/Urinary Tract: There is a 1.3 cm nodule in the left adrenal gland which is indeterminate with only mild loss of signal on the out of phase sequences, image 29/9. This is a new finding when compared with the remote CT of the abdomen pelvis from 09/17/2020. Normal right adrenal gland. No kidney mass or signs of obstructive uropathy. Stomach/Bowel: Visualized portions within the abdomen are unremarkable. Vascular/Lymphatic: No aneurysm. Aortic atherosclerosis. No adenopathy. Other:  No ascites. Musculoskeletal: Multifocal abnormal areas of enhancement are identified throughout the visualized portions of the thoracic and lumbar spine which are concerning for osseous metastasis. IMPRESSION: 1. There are multifocal bilobar, ill-defined areas of mild increased T2 signal noted within both lobes of liver which show hypoenhancement on the postcontrast images and restricted diffusion. At least 5 lesions  are noted involving both lobes. 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 2. Multifocal abnormal areas of enhancement are identified throughout the visualized portions of the thoracic and lumbar spine which are concerning for osseous metastasis. 3. There are 2 wedge-shaped areas of peripheral hypoenhancement within the proximal portion of the spleen which are concerning for splenic infarcts. 4. There is a 1.3 cm nodule in the left adrenal gland which is indeterminate with only mild loss of signal on the out of phase sequences. This is a new finding when compared with the remote CT of the abdomen pelvis from 09/17/2020. Differential considerations include lipid poor adenoma versus a adrenal metastasis. 5. Small right pleural effusion.  6. Partially visualized mass within the superior segment of right lower lobe. 7. Status post cholecystectomy. Mild intrahepatic bile duct dilatation. Fusiform dilatation of the common bile duct which measures 1.3 cm proximally. No signs of choledocholithiasis or mass. Electronically Signed   By: Kimberley Penman M.D.   On: 11/12/2023 05:07   CT ANGIO HEAD NECK W WO CM Addendum Date: 11/11/2023 ADDENDUM REPORT: 11/11/2023 23:37 ADDENDUM: Findings discussed with Brenda Morisson, NP via telephone at 10:33 p.m. Electronically Signed   By: Stevenson Elbe M.D.   On: 11/11/2023 23:37   Result Date: 11/11/2023 CLINICAL DATA:  Stroke/TIA, determine embolic source EXAM: CT ANGIOGRAPHY HEAD AND NECK WITH AND WITHOUT CONTRAST TECHNIQUE: Multidetector CT imaging of the head and neck was performed using the standard protocol during bolus administration of intravenous contrast. Multiplanar CT image reconstructions and MIPs were obtained to evaluate the vascular anatomy. Carotid stenosis measurements (when applicable) are obtained utilizing NASCET criteria, using the distal internal carotid diameter as the denominator. RADIATION DOSE REDUCTION: This exam was performed according to the  departmental dose-optimization program which includes automated exposure control, adjustment of the mA and/or kV according to patient size and/or use of iterative reconstruction technique. CONTRAST:  75mL OMNIPAQUE  IOHEXOL  350 MG/ML SOLN COMPARISON:  Same day MRI head. FINDINGS: CTA NECK FINDINGS Aortic arch: Aortic atherosclerosis. 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 Great vessel origins are patent. Right carotid system: Atherosclerosis at the carotid bifurcation without greater than 50% stenosis. Left carotid system: Atherosclerosis at the carotid bifurcation without greater than 50% stenosis. Vertebral arteries: Codominant. No evidence of dissection, stenosis (50% or greater), or occlusion. Skeleton: No evidence of acute abnormality on limited assessment. Multilevel degenerative change. Other neck: Approximately 2.3 cm left thyroid  nodule. Upper chest: Please see same day CT chest for intrathoracic findings. Emphysema. Review of the MIP images confirms the above findings CTA HEAD FINDINGS Anterior circulation: Bilateral intracranial ICAs are patent. Approximately 2 mm medially directed outpouching arising from the right cavernous ICA, which could represent an infundibulum or aneurysm. Bilateral M1 MCAs are patent. 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 Bilateral ACAs are patent without proximal hemodynamically significant stenosis. Posterior circulation: Bilateral intradural vertebral arteries, basilar artery and bilateral posterior cerebral arteries are patent without proximal hemodynamically significant stenosis. Venous sinuses: As permitted by contrast timing, patent. Review of the MIP images confirms the above findings The ordering provider has been paged at the time of dictation for call of report. IMPRESSION: 1. 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 2. Approximately 2 mm medially directed outpouching arising from the  right cavernous ICA, which could represent an infundibulum or aneurysm. 3. Please see same day CT chest for intrathoracic findings. 4. Approximately 2.3 cm left thyroid  nodule. Recommend thyroid  US  (ref: J Am Coll Radiol. 2015 Feb;12(2): 143-50). 5. Aortic Atherosclerosis (ICD10-I70.0) and Emphysema (ICD10-J43.9). Electronically Signed: By: Stevenson Elbe M.D. On: 11/11/2023 23:14   MR BRAIN W WO CONTRAST Result Date: 11/11/2023 CLINICAL DATA:  right facial weakness, malignancy also on the ddx EXAM: MRI HEAD WITHOUT AND WITH CONTRAST TECHNIQUE: Multiplanar, multiecho pulse sequences of the brain and surrounding structures were obtained without and with intravenous contrast. CONTRAST:  8mL GADAVIST  GADOBUTROL  1 MMOL/ML IV SOLN COMPARISON:  CT head November 11, 2023. 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 Mild edema without mass effect. No midline shift. Probable additional punctate acute infarct in the inferior  left cerebellum. No evidence of acute hemorrhage, mass lesion, or hydrocephalus. Vascular: Major arterial flow voids are maintained at the skull base. Skull and upper cervical spine: Normal marrow signal. Sinuses/Orbits: Clear sinuses.  No acute orbital findings. IMPRESSION: 1. 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 Probable additional punctate acute infarct in the inferior left cerebellum. These results will be called to the ordering clinician or representative by the Radiologist Assistant, and communication documented in the PACS or [employer]. Electronically Signed   By: Stevenson Elbe M.D.   On: 11/11/2023 20:27   CT CHEST W CONTRAST Result Date: 11/11/2023 CLINICAL DATA:  Liver lesions with history of breast cancer. EXAM: CT CHEST WITH CONTRAST TECHNIQUE: Multidetector CT imaging of the chest was performed during intravenous  contrast administration. RADIATION DOSE REDUCTION: This exam was performed according to the departmental dose-optimization program which includes automated exposure control, adjustment of the mA and/or kV according to patient size and/or use of iterative reconstruction technique. CONTRAST:  75mL OMNIPAQUE  IOHEXOL  300 MG/ML  SOLN COMPARISON:  CT abdomen and pelvis 11/10/2023. FINDINGS: Cardiovascular: Heart is borderline enlarged/mildly enlarged. Aorta is normal in size. There is no pericardial effusion. There are atherosclerotic calcifications of the aorta. Mediastinum/Nodes: There is a heterogeneous left thyroid  nodule attaining cystic and solid components measuring 2.6 x 1.5 cm. There are multiple enlarged right paratracheal lymph nodes measuring up to 2.0 x 2.9 cm. Enlarged subcarinal lymph node measures 1.2 cm short axis. There is a prominent right lower esophageal lymph node measuring 8 mm short axis. There are multiple enlarged right hilar lymph nodes measuring up to 1.4 x 1.8 cm. Visualized esophagus is within normal limits. Lungs/Pleura: Mild emphysematous changes are present. There is scarring in both lung apices. Multifocal airspace disease is seen throughout the right lower lobe, predominantly posteriorly and peripherally. Some areas are slightly nodular for example superior segment measuring 3.2 by 2.0 cm. A small air-fluid level seen in this region. No other air-fluid levels are seen. Additional nodular density seen in the right lower lobe image 5/92 measuring 9 mm. Smaller nodules are seen scattered throughout the right lower lobe measuring 5 mm or less. There is a trace right pleural effusion. There is a right middle lobe nodule measuring 5 mm image 5/122. There are prominent intrapulmonary lymph nodes along the right major fissure. There are 2 mm nodular densities in the left upper lobe. Left lung is otherwise clear. Upper Abdomen: Subcentimeter hypodensities in the liver and left adrenal mass are  unchanged. Please see CT abdomen and pelvis performed same day for further description. Musculoskeletal: There is anterior intramuscular chest wall edema on the right. Focal density is seen in the medial right breast extending to the skin surface measuring 1.2 x 2.3 cm image 3/81. There some associated diffuse right breast skin thickening. No acute fracture. There is a single 5 mm sclerotic density in the T3 vertebral body. IMPRESSION: 1. Multifocal airspace disease throughout the right lower lobe, predominantly posteriorly and peripherally. Single area is slightly nodular in the superior segment containing an air-fluid level worrisome for necrotic pneumonia or necrotic nodule. Findings may be infectious/inflammatory, but neoplasm not excluded. 2. Trace right pleural effusion. 3. Mediastinal and right hilar lymphadenopathy. 4. Additional right lower lobe and right middle lobe pulmonary nodules measuring 5 mm. 5. Left thyroid  nodule measuring 2.6 cm. Recommend further evaluation with ultrasound. 6. Focal density in the medial right breast extending to the skin surface measuring  1.2 x 2.3 cm. Correlate with physical exam. 7. Single 5 mm sclerotic density in the T3 vertebral body. Aortic Atherosclerosis (ICD10-I70.0) and Emphysema (ICD10-J43.9). Electronically Signed   By: Tyron Gallon M.D.   On: 11/11/2023 17:53   CT HEAD WO CONTRAST ( ) Result Date: 11/11/2023 CLINICAL DATA:  Right facial weakness. EXAM: CT HEAD WITHOUT CONTRAST TECHNIQUE: Contiguous axial images were obtained from the base of the skull through the vertex without intravenous contrast. RADIATION DOSE REDUCTION: This exam was performed according to the departmental dose-optimization program which includes automated exposure control, adjustment of the mA and/or kV according to patient size and/or use of iterative reconstruction technique. COMPARISON:  Head CT 07/21/2022 FINDINGS: Brain: There are new small hypodensities in the left frontoparietal  white matter at the level of the posterior centrum semiovale (series 3, image 20), and there is a new subcentimeter focus of cortical hypodensity at the level of the left parietal operculum (series 6, image 46), suspicious for recent infarcts. No intracranial hemorrhage, mass, midline shift, or extra-axial fluid collection is identified. Cerebral volume is normal. The ventricles are normal in size. Vascular: No hyperdense vessel. Skull: No acute fracture or suspicious lesion. Sinuses/Orbits: Visualized paranasal sinuses and mastoid air cells are clear. Bilateral cataract extraction. Other: None. 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 A brain MRI is in progress and will provide further evaluation. Electronically Signed   By: Aundra Lee M.D.   On: 11/11/2023 15:13   DG Chest 2 View Result Date: 11/10/2023 CLINICAL DATA:  dyspnea EXAM: CHEST - 2 VIEW COMPARISON:  Chest x-ray 07/21/2022, chest x-ray 02/15/2021 FINDINGS: The heart and mediastinal contours are unchanged. Prominent hilar vasculature. No focal consolidation. No pulmonary edema. No pleural effusion. No pneumothorax. No acute osseous abnormality. IMPRESSION: Prominent hilar vasculature suggestive of pulmonary venous congestion. Underlying lymph nodes not excluded. Electronically Signed   By: Morgane  Naveau M.D.   On: 11/10/2023 23:50   CT ABDOMEN PELVIS W CONTRAST Result Date: 11/10/2023 CLINICAL DATA:  Acute nonlocalized abdominal pain. Vomiting and diarrhea. EXAM: CT ABDOMEN AND PELVIS WITH CONTRAST TECHNIQUE: Multidetector CT imaging of the abdomen and pelvis was performed using the standard protocol following bolus administration of intravenous contrast. RADIATION DOSE REDUCTION: This exam was performed according to the departmental dose-optimization program which includes automated exposure control, adjustment of the mA and/or kV according to patient size and/or use of iterative  reconstruction technique. CONTRAST:  OMNIPAQUE  IOHEXOL  300 MG/ML  SOLN COMPARISON:  None Available. FINDINGS: Lower chest: Focal consolidation suggested in the right lung base with small right pleural effusion. This may represent pneumonia or less likely aspiration. Emphysematous changes in the lungs. Hepatobiliary: Multiple scattered low-attenuation lesions in the liver, most are subcentimeter in size. Largest is in the medial segment left lobe measuring 1.3 cm in diameter. Enhancement pattern is indeterminate. Consider follow-up MRI for further characterization. The gallbladder is not visualized, likely surgically absent. Mild intrahepatic bile duct dilatation is most likely due to postoperative change. No common duct stones are identified. Pancreas: Unremarkable. No pancreatic ductal dilatation or surrounding inflammatory changes. Spleen: Segmental low-attenuation in the upper spleen likely represents a splenic infarct. Spleen size is normal. Adrenals/Urinary Tract: 1.3 cm diameter left adrenal gland nodule with density measurement of 69 Hounsfield units. Renal nephrograms are symmetrical. No solid mass identified. No hydronephrosis or hydroureter. Bladder is normal. Stomach/Bowel: Stomach is within normal limits. Appendix appears normal. No evidence of bowel wall thickening, distention, or inflammatory changes. Vascular/Lymphatic: Aortic atherosclerosis.  No enlarged abdominal or pelvic lymph nodes. Reproductive: Uterus and bilateral adnexa are unremarkable. Other: No abdominal wall hernia or abnormality. No abdominopelvic ascites. Musculoskeletal: Postoperative fixation of the right hip. No acute bony abnormalities. IMPRESSION: 1. Nonspecific low-attenuation lesions in the liver, largest measuring 1.3 cm diameter. Enhancement pattern is indeterminate. Consider follow-up MRI for further characterization. 2. Mild intrahepatic bile duct dilatation is likely postoperative in the setting of cholecystectomy. 3.  Left adrenal mass measuring 1.3 cm, probable benign adenoma. Recommend follow-up adrenal washout CT in 1 year. If stable for = 1 year, no further follow-up imaging. JACR 2017 Aug; 14(8):1038-44, JCAT 2016 Mar-Apr; 40(2):194-200, Urol J 2006 Spring; 3(2):71-4. 4. Aortic atherosclerosis. 5. Focal area of consolidation suggested in the right lung base with small right pleural effusion, likely pneumonia. Electronically Signed   By: Boyce Byes M.D.   On: 11/10/2023 23:30

## 2023-12-22 NOTE — Patient Instructions (Signed)
 [facility] - A DEPT OF [facility]  Discharge Instructions: Thank you for choosing [facility] to provide your oncology and hematology care.  If you have a lab appointment with the Cancer Center, please go directly to the Cancer Center and check in at the registration area.  Wear comfortable clothing and clothing appropriate for easy access to any Portacath or PICC line.   We strive to give you quality time with your provider. You may need to reschedule your appointment if you arrive late (15 or more minutes).  Arriving late affects you and other patients whose appointments are after yours.  Also, if you miss three or more appointments without notifying the office, you may be dismissed from the clinic at the provider's discretion.      For prescription refill requests, have your pharmacy contact our office and allow 72 hours for refills to be completed.    Today you received the following chemotherapy and/or immunotherapy agents Taxol.      To help prevent nausea and vomiting after your treatment, we encourage you to take your nausea medication as directed.  BELOW ARE SYMPTOMS THAT SHOULD BE REPORTED IMMEDIATELY: *FEVER GREATER THAN 100.4 F (38 C) OR HIGHER *CHILLS OR SWEATING *NAUSEA AND VOMITING THAT IS NOT CONTROLLED WITH YOUR NAUSEA MEDICATION *UNUSUAL SHORTNESS OF BREATH *UNUSUAL BRUISING OR BLEEDING *URINARY PROBLEMS (pain or burning when urinating, or frequent urination) *BOWEL PROBLEMS (unusual diarrhea, constipation, pain near the anus) TENDERNESS IN MOUTH AND THROAT WITH OR WITHOUT PRESENCE OF ULCERS (sore throat, sores in mouth, or a toothache) UNUSUAL RASH, SWELLING OR PAIN  UNUSUAL VAGINAL DISCHARGE OR ITCHING   Items with * indicate a potential emergency and should be followed up as soon as possible or go to the Emergency Department if any problems should occur.  Please show the CHEMOTHERAPY ALERT CARD or IMMUNOTHERAPY ALERT  CARD at check-in to the Emergency Department and triage nurse.  Should you have questions after your visit or need to cancel or reschedule your appointment, please contact [facility] - A DEPT OF [facility]  [phone] and follow the prompts.  Office hours are 8:00 a.m. to 4:30 p.m. Monday - Friday. Please note that voicemails left after 4:00 p.m. may not be returned until the following business day.  We are closed weekends and major holidays. You have access to a nurse at all times for urgent questions. Please call the main number to the clinic [phone] and follow the prompts.  For any non-urgent questions, you may also contact your provider using MyChart. We now offer e-Visits for anyone 82 and older to request care online for non-urgent symptoms. For details visit [email or web address].   Also download the MyChart app! Go to the app store, search "MyChart", open the app, select [facility], and log in with your MyChart username and password.

## 2023-12-22 NOTE — Telephone Encounter (Signed)
 Clinical Social Work attempted to contact patient by phone to assess needs.  Left voicemail with contact information and request for return call.
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
## 2023-12-22 NOTE — Assessment & Plan Note (Signed)
 Continue Lovenox  1 mg/kg subcutaneous twice daily. Continue statin

## 2023-12-22 NOTE — Assessment & Plan Note (Signed)
 Continue potassium chloride  20 mEq once daily.

## 2023-12-22 NOTE — Assessment & Plan Note (Signed)
 Continue slow mag 64mg  oral daily

## 2023-12-22 NOTE — Assessment & Plan Note (Addendum)
 Recommend ondansetron  disintegrating tablets 8 mg every 8 hours as needed. She may also try Compazine  10 mg every 6 hours as needed Patient will get Phenergan  6.25 mg IV x 1

## 2023-12-22 NOTE — Progress Notes (Signed)
 Nutrition Follow-up:  Referral for poor appetite  Patient with metastatic breast cancer.  Currently receiving taxol .  Followed by Palliative Care.  Met with patient during infusion.  Eating sandwich. Reports that her appetite is better.  She ate some spaghetti and [place] food that son brought last night for dinner.  Drinks boost shakes daily but wanting to try ensure.  Ate bacon and eggs this am before coming.  Says that she is feeling better and pain better controlled.      Medications: dexamethasone   Labs: glucose 123  Anthropometrics:   Weight 173 lb today  179 lb on 4/29 180 lb on 11/10/23 178 lb on 11/12/22  4% weight loss in the last 1 1/2 months  Estimated Energy Needs  Kcals: 1950-2300 Protein: 93-117 g Fluid: 1950-2300 ml  NUTRITION DIAGNOSIS: Inadequate oral intake related to cancer, pain as evidenced by 4% weight loss in the last month and half and eating less.     INTERVENTION:  Offered sample of ensure complete to try. Encouraged 350 calorie shake at least daily Encouraged foods rich in protein Contact information provided    MONITORING, EVALUATION, GOAL: weight trends, intake   NEXT VISIT: Tuesday, May 27 during infusion  Haik Mahoney B. Zollie Hipp, CSO, LDN Registered Dietitian [phone]

## 2023-12-23 ENCOUNTER — Encounter: Admitting: Hospice and Palliative Medicine

## 2023-12-23 ENCOUNTER — Inpatient Hospital Stay: Payer: Medicare Other | Admitting: Oncology

## 2023-12-29 ENCOUNTER — Other Ambulatory Visit

## 2023-12-29 ENCOUNTER — Inpatient Hospital Stay: Admitting: Hospice and Palliative Medicine

## 2023-12-29 ENCOUNTER — Inpatient Hospital Stay: Admitting: Oncology

## 2023-12-29 ENCOUNTER — Inpatient Hospital Stay

## 2023-12-30 ENCOUNTER — Inpatient Hospital Stay

## 2023-12-30 ENCOUNTER — Encounter: Payer: Self-pay | Admitting: Oncology

## 2023-12-30 ENCOUNTER — Other Ambulatory Visit: Payer: Self-pay

## 2023-12-30 ENCOUNTER — Inpatient Hospital Stay (HOSPITAL_BASED_OUTPATIENT_CLINIC_OR_DEPARTMENT_OTHER): Admitting: Hospice and Palliative Medicine

## 2023-12-30 ENCOUNTER — Inpatient Hospital Stay: Admitting: Oncology

## 2023-12-30 VITALS — BP 118/65 | HR 80

## 2023-12-30 VITALS — BP 127/67 | HR 94 | Temp 98.8°F | Wt 170.2 lb

## 2023-12-30 DIAGNOSIS — C50919 Malignant neoplasm of unspecified site of unspecified female breast: Secondary | ICD-10-CM

## 2023-12-30 DIAGNOSIS — Z5111 Encounter for antineoplastic chemotherapy: Secondary | ICD-10-CM | POA: Diagnosis not present

## 2023-12-30 DIAGNOSIS — I6310 Cerebral infarction due to embolism of unspecified precerebral artery: Secondary | ICD-10-CM | POA: Diagnosis not present

## 2023-12-30 DIAGNOSIS — C7951 Secondary malignant neoplasm of bone: Secondary | ICD-10-CM

## 2023-12-30 DIAGNOSIS — G893 Neoplasm related pain (acute) (chronic): Secondary | ICD-10-CM

## 2023-12-30 DIAGNOSIS — E876 Hypokalemia: Secondary | ICD-10-CM | POA: Diagnosis not present

## 2023-12-30 LAB — CMP (CANCER CENTER ONLY)
ALT: 85 U/L — ABNORMAL HIGH (ref 0–44)
AST: 53 U/L — ABNORMAL HIGH (ref 15–41)
Albumin: 3.1 g/dL — ABNORMAL LOW (ref 3.5–5.0)
Alkaline Phosphatase: 77 U/L (ref 38–126)
Anion gap: 13 (ref 5–15)
BUN: 17 mg/dL (ref 8–23)
CO2: 26 mmol/L (ref 22–32)
Calcium: 8.8 mg/dL — ABNORMAL LOW (ref 8.9–10.3)
Chloride: 92 mmol/L — ABNORMAL LOW (ref 98–111)
Creatinine: 0.85 mg/dL (ref 0.44–1.00)
GFR, Estimated: >60 mL/min (ref >60)
Glucose, Bld: 132 mg/dL — ABNORMAL HIGH (ref 70–99)
Potassium: 4.1 mmol/L (ref 3.5–5.1)
Sodium: 131 mmol/L — ABNORMAL LOW (ref 135–145)
Total Bilirubin: 0.6 mg/dL (ref 0.0–1.2)
Total Protein: 6.9 g/dL (ref 6.5–8.1)

## 2023-12-30 LAB — CBC WITH DIFFERENTIAL (CANCER CENTER ONLY)
Abs Immature Granulocytes: 0.57 10*3/uL — ABNORMAL HIGH (ref 0.00–0.07)
Basophils Absolute: 0.1 10*3/uL (ref 0.0–0.1)
Basophils Relative: 0 %
Eosinophils Absolute: 0.1 10*3/uL (ref 0.0–0.5)
Eosinophils Relative: 0 %
HCT: 27.7 % — ABNORMAL LOW (ref 36.0–46.0)
Hemoglobin: 8.9 g/dL — ABNORMAL LOW (ref 12.0–15.0)
Immature Granulocytes: 5 %
Lymphocytes Relative: 14 %
Lymphs Abs: 1.7 10*3/uL (ref 0.7–4.0)
MCH: 30.6 pg (ref 26.0–34.0)
MCHC: 32.1 g/dL (ref 30.0–36.0)
MCV: 95.2 fL (ref 80.0–100.0)
Monocytes Absolute: 1 10*3/uL (ref 0.1–1.0)
Monocytes Relative: 8 %
Neutro Abs: 9 10*3/uL — ABNORMAL HIGH (ref 1.7–7.7)
Neutrophils Relative %: 73 %
Platelet Count: 406 10*3/uL — ABNORMAL HIGH (ref 150–400)
RBC: 2.91 MIL/uL — ABNORMAL LOW (ref 3.87–5.11)
RDW: 16.9 % — ABNORMAL HIGH (ref 11.5–15.5)
WBC Count: 12.4 10*3/uL — ABNORMAL HIGH (ref 4.0–10.5)
nRBC: 0.5 % — ABNORMAL HIGH (ref 0.0–0.2)

## 2023-12-30 LAB — FERRITIN: Ferritin: 659 ng/mL — ABNORMAL HIGH (ref 11–307)

## 2023-12-30 LAB — MAGNESIUM: Magnesium: 1.7 mg/dL (ref 1.7–2.4)

## 2023-12-30 LAB — IRON AND TIBC
Iron: 73 ug/dL (ref 28–170)
Saturation Ratios: 22 % (ref 10.4–31.8)
TIBC: 337 ug/dL (ref 250–450)
UIBC: 264 ug/dL

## 2023-12-30 MED ORDER — SODIUM CHLORIDE 0.9 % IV SOLN
80.0000 mg/m2 | Freq: Once | INTRAVENOUS | Status: AC
  Administered 2023-12-30: 150 mg via INTRAVENOUS
  Filled 2023-12-30: qty 25

## 2023-12-30 MED ORDER — DEXAMETHASONE SODIUM PHOSPHATE 10 MG/ML IJ SOLN
10.0000 mg | Freq: Once | INTRAMUSCULAR | Status: AC
  Administered 2023-12-30: 10 mg via INTRAVENOUS
  Filled 2023-12-30: qty 1

## 2023-12-30 MED ORDER — DIPHENHYDRAMINE HCL 50 MG/ML IJ SOLN
50.0000 mg | Freq: Once | INTRAMUSCULAR | Status: AC
  Administered 2023-12-30: 50 mg via INTRAVENOUS
  Filled 2023-12-30: qty 1

## 2023-12-30 MED ORDER — SODIUM CHLORIDE 0.9 % IV SOLN
INTRAVENOUS | Status: DC
  Filled 2023-12-30: qty 250

## 2023-12-30 MED ORDER — MORPHINE SULFATE ER 15 MG PO TBCR: 15.0000 mg | EXTENDED_RELEASE_TABLET | Freq: Two times a day (BID) | ORAL | 0 refills | Status: DC

## 2023-12-30 MED ORDER — FAMOTIDINE IN NACL 20-0.9 MG/50ML-% IV SOLN
20.0000 mg | Freq: Once | INTRAVENOUS | Status: AC
  Administered 2023-12-30: 20 mg via INTRAVENOUS
  Filled 2023-12-30: qty 50

## 2023-12-30 MED ORDER — HEPARIN SOD (PORK) LOCK FLUSH 100 UNIT/ML IV SOLN
500.0000 [IU] | Freq: Once | INTRAVENOUS | Status: AC | PRN
  Administered 2023-12-30: 500 [IU]
  Filled 2023-12-30: qty 5

## 2023-12-30 MED ORDER — DEXAMETHASONE 1 MG PO TABS: 1.0000 mg | ORAL_TABLET | Freq: Every day | ORAL | 0 refills | Status: DC

## 2023-12-30 NOTE — Progress Notes (Signed)
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 Date/Time   IRON 73 12/30/2023 1238   TIBC 337 12/30/2023 1238   FERRITIN 659 (H) 12/30/2023 1238   IRONPCTSAT 22 12/30/2023 1238      RADIOGRAPHIC STUDIES: I have personally reviewed the radiological images as listed and agreed with the findings in the report.NM PET Image Initial (PI) Skull Base To Thigh Result Date: 12/28/2023 CLINICAL DATA:  Initial treatment strategy for metastatic breast cancer. EXAM: NUCLEAR MEDICINE PET SKULL BASE TO THIGH TECHNIQUE: 9.78 mCi F-18 FDG was injected intravenously. Full-ring PET imaging was performed from the skull base to thigh after the radiotracer. CT data was obtained and used for attenuation correction and anatomic localization. Fasting blood glucose: 133 mg/dl COMPARISON:  CTA head neck 11/11/2023. MRI abdomen 11/11/2023. Chest CT 11/11/2023. Abdomen pelvis CT 11/10/2023. Multiple older exams. FINDINGS: Mediastinal blood pool activity: SUV max  2.9 Liver activity: SUV max 3.4 NECK: No specific abnormal uptake seen in the lymph node change the neck including submandibular, posterior triangle or internal jugular. Exception is some prominent lymph nodes in the thoracic inlet on the right side which have maximum SUV value of 4.7. These measure up to 7 mm on image 34 series 6. Near symmetric uptake of the visualized intracranial compartment. However there is uptake along lesion in the posterior aspect of the left thyroid  gland or immediately adjacent. This has maximum SUV of 11.6. On prior this was measured at 2.6 x 1.5 cm and today 2.8 by 1.5 cm. Please correlate for any known history or prior workup. It is possible this is a lesion adjacent to the thyroid  and not originating from the thyroid  gland. Incidental CT findings: Prominent vascular calcifications are seen. Paranasal sinuses and mastoid air cells seen are grossly clear. Parotid glands and submandibular glands are preserved. CHEST: There are several hypermetabolic enlarged nodes identified in the mediastinum on the right side previous includes paratracheal and subcarinal. Additional nodes in the right hilum of the lung as well. Example of a right paratracheal node has maximum SUV of 6.0 and measures 2.5 x 1.4 cm on series 6, image 48. On the prior examination this same lesion measured 3.0 x 2.0 cm in March 2025, smaller. Other nodes also so some improvement in the mediastinum. Right hilum has some areas of uptake approaching maximum SUV of 4.8. In addition there are some abnormal nodes identified along the internal mammary chain on the right  with a focus on PET image 52 of maximum SUV value of 5.3. This node is new from prior CT scan. Increasing right-sided pleural effusion. There are reticulonodular areas seen scattered throughout the right lung with masslike areas greatest in the right lower lobe. These have significant abnormal uptake. Focus in the superior segment of the right lower lobe has  maximum SUV value of 8.5 and on CT image 52 nodular area measures 3.7 x 2.3 cm. On the prior CT scan there is a smaller focus in this location measuring approximately 14 by 10 mm. Again numerous other areas scattered diffusely in the right lung. In addition there is some pleural thickening at the right costophrenic angle which has some uptake. Is spread of disease to the pleura is possible. Few other areas suggested anteriorly in the right hemithorax. No abnormal uptake in the left lung. Incidental CT findings: Heart is nonenlarged. No pericardial effusion. Diffuse vascular calcifications. Right IJ chest port in place with tip extending to the right atrium. Slightly patulous thoracic esophagus. Breathing motion. No left-sided pleural effusion. Underlying emphysematous lung changes. Nodular thickening along the right breast is again identified. ABDOMEN/PELVIS: There are some scattered foci of abnormal uptake in the liver consistent with known liver metastases. On MRI example lesion was measured in segment 5 at 11 mm. Today this focus measures 13 mm. Maximum SUV value of 5.7. This is measured on image 87. Few other scattered foci as well are slightly enlarged. Not every lesion shows abnormal uptake on this examination. There is a left adrenal gland area of uptake with maximum SUV value 5.0. Lesion today measures 15 mm on image 84 and on the prior examinations 10 mm. There is physiologic uptake elsewhere along the parenchymal organs, bowel and renal collecting systems. Incidental CT findings: Diffuse colonic stool identified. Uterus is present. Diffuse vascular calcifications. No abnormal calcifications seen within either kidney nor along the course of either ureter. Contracted urinary bladder. Stable dilatation of the biliary tree. Prior cholecystectomy. SKELETON: Multifocal areas of abnormal uptake along the skeleton is consistent with osseous metastatic disease. This includes areas along the spine, femurs, humeri,  pelvis, ribs, sternum as well as along the skull. Example of uptake seen at the L2 vertebral body has maximum SUV of 7.3. There is associated areas of compression deformities along several vertebral bodies including L4, L1 which could be more Schmorl's node. Incidental CT findings: Scattered degenerative changes along the spine and other structures. Pins along the right femoral head. IMPRESSION: Extensive metastatic disease identified. On prior CT scans there were several mediastinal and right hilar nodes. Some of these have decreased in size however there is extensive increase in right lung hypermetabolic mass areas as well as consolidation, interstitial septal thickening and pleural thickening worrisome for lymphangitic spread of disease. Increasing right-sided small pleural effusion. Additional nodes in the thoracic inlet. There is particularly hypermetabolic lesion along the posterior margin of the left thyroid  gland. This could be medially adjacent to the thyroid  versus emanating directly from the thyroid  gland. Extensive osseous metastatic disease diffusely distributed. Multiple liver metastases identified. Not all lesions seen previously are hypermetabolic compared to background liver activity. A few are larger in size slightly. Increasing size of hypermetabolic left adrenal nodule. Electronically Signed   By: Adrianna Horde M.D.   On: 12/28/2023 13:59   IR IMAGING GUIDED PORT INSERTION Result Date: 12/14/2023 INDICATION: 68 year old female with metastatic breast cancer. She presents for placement of a port catheter to establish durable venous access. EXAM: IMPLANTED PORT  A CATH PLACEMENT WITH ULTRASOUND AND FLUOROSCOPIC GUIDANCE MEDICATIONS: None ANESTHESIA/SEDATION: Versed  1 mg IV; Fentanyl  50 mcg IV; administered by the radiology nurse Moderate Sedation Time:  16 minutes The patient's vital signs and level of consciousness were continuously monitored during the procedure by the interventional radiology  nurse under my direct supervision. FLUOROSCOPY: Radiation exposure index: 1 mGy reference air kerma COMPLICATIONS: None immediate. PROCEDURE: The right neck and chest was prepped with chlorhexidine , and draped in the usual sterile fashion using maximum barrier technique (cap and mask, sterile gown, sterile gloves, large sterile sheet, hand hygiene and cutaneous antiseptic). Local anesthesia was attained by infiltration with 1% lidocaine  with epinephrine . Ultrasound demonstrated patency of the right internal jugular vein, and this was documented with an image. Under real-time ultrasound guidance, this vein was accessed with a 21 gauge micropuncture needle and image documentation was performed. A small dermatotomy was made at the access site with an 11 scalpel. A 0.018" wire was advanced into the SVC and the access needle exchanged for a 12F micropuncture vascular sheath. The 0.018" wire was then removed and a 0.035" wire advanced into the IVC. An appropriate location for the subcutaneous reservoir was selected below the clavicle and an incision was made through the skin and underlying soft tissues. The subcutaneous tissues were then dissected using a combination of blunt and sharp surgical technique and a pocket was formed. A Bard Clear Vue single lumen power injectable portacatheter was then tunneled through the subcutaneous tissues from the pocket to the dermatotomy and the port reservoir placed within the subcutaneous pocket. The venous access site was then serially dilated and a peel away vascular sheath placed over the wire. The wire was removed and the port catheter advanced into position under fluoroscopic guidance. The catheter tip is positioned in the upper right atrium. This was documented with a spot image. The portacatheter was then tested and found to flush and aspirate well. The port was flushed with saline followed by 100 units/mL heparinized saline. The pocket was then closed in two layers using first  subdermal inverted interrupted absorbable sutures followed by a running subcuticular suture. The epidermis was then sealed with Dermabond. The dermatotomy at the venous access site was also closed with Dermabond. IMPRESSION: Successful placement of a right IJ approach Bard Clear Vue port catheter with ultrasound and fluoroscopic guidance. The catheter is ready for use. Electronically Signed   By: Fernando Hoyer M.D.   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 Recent biopsy of a right supraclavicular lymph node but additional tissue is needed for diagnostic purposes. EXAM: ULTRASOUND-GUIDED CORE BIOPSY OF RIGHT SUPRACLAVICULAR LYMPH NODE MEDICATIONS: Moderate sedation ANESTHESIA/SEDATION: Moderate (conscious) sedation was employed during this procedure. A total of Versed  1 mg and Fentanyl  50 mcg was administered intravenously by the radiology nurse. Total intra-service moderate Sedation Time: 15 minutes. The patient's level of consciousness and vital signs were monitored continuously by radiology nursing throughout the procedure under my direct supervision. FLUOROSCOPY TIME:  None COMPLICATIONS: None immediate. PROCEDURE: Informed written consent was obtained from the patient after a thorough discussion of the procedural risks, benefits and alternatives. All questions were addressed. A timeout was performed prior to the initiation of the procedure. Ultrasound was used to identify enlarged right supraclavicular lymph nodes. Right supraclavicular area was prepped with chlorhexidine  and sterile field was created. Skin was anesthetized with 1% lidocaine . Small incision was made. Using ultrasound guidance, 17 gauge coaxial needle was  directed into a supraclavicular lymph node. Total of 5 core biopsies were obtained with an 18 gauge core device. Specimens placed in formalin. 17 gauge coaxial needle was removed. Bandage placed  over the puncture site. FINDINGS: Multiple enlarged right supraclavicular lymph nodes. Core biopsy needle confirmed within the supraclavicular lymph node. No immediate bleeding or hematoma formation. IMPRESSION: Successful ultrasound-guided core biopsy of an enlarged right supraclavicular lymph node. Electronically Signed   By: Elene Griffes M.D.   On: 11/19/2023 16:16   ECHOCARDIOGRAM LIMITED Result Date: 11/18/2023    ECHOCARDIOGRAM LIMITED REPORT   Patient Name:   BIBI SISE Date of Exam: 11/17/2023 Medical Rec #:  [id number]    Height:       64.0 in Accession #:    [id number]   Weight:       179.9 lb Date of Birth:  April 09, 1956     BSA:          1.870 m Patient Age:    56 years     BP:           114/88 mmHg Patient Gender: F            HR:           84 bpm. Exam Location:  [facility] Procedure: 2D Echo, Limited Echo, Limited Color Doppler and Cardiac Doppler            (Both Spectral and Color Flow Doppler were utilized during            procedure). Indications:     Elevated Troponin  History:         Patient has prior history of Echocardiogram examinations, most                  recent 11/11/2023. COPD, Arrythmias:Atrial Fibrillation; Risk                  Factors:Hypertension and Dyslipidemia. Obstructive sleep apnea.                  Fatigue. Breast Cancer.  Sonographer:     Brigid Canada RDCS Referring Phys:  [id number] RYAN M DUNN Diagnosing Phys: Sammy Crisp MD IMPRESSIONS  1. Left ventricular ejection fraction, by estimation, is 60 to 65%. The left ventricle has normal function. The left ventricle has no regional wall motion abnormalities. Left ventricular diastolic parameters were normal.  2. Right ventricular systolic function is normal. The right ventricular size is normal. Tricuspid regurgitation signal is inadequate for assessing PA pressure.  3. The mitral valve is degenerative. At least moderate mitral valve regurgitation, which may be underestimated due to jet eccentricity. No evidence of mitral  stenosis.  4. Aortic valve regurgitation is not visualized. No aortic stenosis is present.  5. The inferior vena cava is normal in size with greater than 50% respiratory variability, suggesting right atrial pressure of 3 mmHg. FINDINGS  Left Ventricle: Left ventricular ejection fraction, by estimation, is 60 to 65%. The left ventricle has normal function. The left ventricle has no regional wall motion abnormalities. The left ventricular internal cavity size was normal in size. There is  no left ventricular hypertrophy. Left ventricular diastolic parameters were normal. Right Ventricle: The right ventricular size is normal. No increase in right ventricular wall thickness. Right ventricular systolic function is normal. Tricuspid regurgitation signal is inadequate for assessing PA pressure. Pericardium: There is no evidence of pericardial effusion. Mitral Valve: The mitral valve is degenerative in appearance. There is moderate thickening of the  mitral valve leaflet(s). Moderate mitral valve regurgitation, with wall-impinging jet. No evidence of mitral valve stenosis. Tricuspid Valve: The tricuspid valve is normal in structure. Tricuspid valve regurgitation is trivial. Aortic Valve: Aortic valve regurgitation is not visualized. No aortic stenosis is present. Aortic valve mean gradient measures 6.0 mmHg. Aortic valve peak gradient measures 13.5 mmHg. Aorta: The aortic root is normal in size and structure. Venous: The inferior vena cava is normal in size with greater than 50% respiratory variability, suggesting right atrial pressure of 3 mmHg. LEFT VENTRICLE PLAX 2D LVIDd:         5.20 cm Diastology LVIDs:         2.80 cm LV e' medial:    12.35 cm/s LV PW:         0.70 cm LV E/e' medial:  8.9 LV IVS:        0.70 cm LV e' lateral:   15.75 cm/s                        LV E/e' lateral: 7.0  RIGHT VENTRICLE             IVC RV S prime:     19.85 cm/s  IVC diam: 1.60 cm LEFT ATRIUM         Index LA diam:    3.70 cm 1.98 cm/m   AORTIC VALVE AV Vmax:      183.59 cm/s AV Vmean:     114.120 cm/s AV VTI:       0.317 m AV Peak Grad: 13.5 mmHg AV Mean Grad: 6.0 mmHg  AORTA Ao Root diam: 3.40 cm MITRAL VALVE MV Area (PHT): 3.63 cm MV VTI:        0.40 m MV Decel Time: 209 msec MV E velocity: 109.67 cm/s MV A velocity: 142.67 cm/s MV E/A ratio:  0.77 Veryl Gottron End MD Electronically signed by Sammy Crisp MD Signature Date/Time: 11/18/2023/5:49:00 AM    Final    MR BRAIN WO CONTRAST Result Date: 11/17/2023 CLINICAL DATA:  Provided history: Neuro deficit, acute, stroke suspected. Possible new versus progressed CVA on CTA. Weakness. Aphasia. Facial droop. EXAM: MRI HEAD WITHOUT CONTRAST TECHNIQUE: Multiplanar, multiecho pulse sequences of the brain and surrounding structures were obtained without intravenous contrast. COMPARISON:  Head CT 11/17/2023. CT angiogram head/neck 11/11/2023. Brain MRI 11/11/2023. FINDINGS: Brain: No age-advanced or lobar predominant cerebral atrophy. Numerous small acute infarcts within the left cerebellar hemisphere and left superior cerebellar peduncle, the majority of which are new from the prior MRI of 11/11/2023. Several new small acute cortical infarcts are present within the right frontal, right parietal, left parietal and left occipital lobes. A new punctate acute infarct is also present within the left thalamus. Known patchy acute/early subacute infarcts elsewhere within the left frontal and left parietal lobes (MCA vascular territory). 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 Background mild multifocal T2 FLAIR hyperintense signal abnormality within the cerebral white matter, nonspecific but compatible with chronic small vessel ischemic disease. Punctate chronic microhemorrhage within the left occipital lobe, unchanged (series 10, image 25). No evidence of an intracranial mass. No extra-axial fluid collection. No midline shift. Vascular: Please see the recent prior CTA head/neck  11/11/2023. Small foci of susceptibility-weighted signal loss along the left frontal lobe, new from the prior MRI and suspicious for emboli within distal left MCA branches (series 10, image 46). Skull and upper cervical spine: No focal worrisome marrow lesion. Incompletely assessed cervical spondylosis. Sinuses/Orbits: No mass  or acute finding within the imaged orbits. Prior bilateral ocular lens replacement. Minimal mucosal thickening within the right maxillary sinus. IMPRESSION: 1. Numerous small acute infarcts within the left cerebellar hemisphere/superior cerebellar peduncle, the majority of which are new from the prior brain MRI of 11/11/2023. Additionally, there are several new small acute cortical infarcts within the right frontal, right parietal, left parietal and left occipital lobes, as well as a new punctate acute infarct within the left thalamus. Involvement of multiple vascular territories concerning for an embolic process. New small foci of signal abnormality along the left frontal lobe suspicious for emboli within distal left middle cerebral artery branches. 2. Known patchy acute/early subacute infarcts elsewhere within the left frontal and left parietal lobes (MCA vascular territory). 3. Background mild cerebral white matter chronic small vessel ischemic disease. 4. Unchanged chronic lacunar infarct within the right caudate nucleus. Electronically Signed   By: Bascom Lily D.O.   On: 11/17/2023 16:31   DG Chest Portable 1 View Result Date: 11/17/2023 CLINICAL DATA:  Vomiting.  Weakness. EXAM: PORTABLE CHEST 1 VIEW COMPARISON:  11/10/2023 FINDINGS: Bilateral lung fields are clear. Bilateral costophrenic angles are clear. Stable cardio-mediastinal silhouette. No acute osseous abnormalities. The soft tissues are within normal limits. IMPRESSION: No active disease. Electronically Signed   By: Beula Brunswick M.D.   On: 11/17/2023 11:49   CT Head Wo Contrast Result Date: 11/17/2023 CLINICAL DATA:   68 year old female neurologic deficit with slurred speech, right facial droop. Recent left MCA M2 occlusion, posterior left MCA infarcts, occasional other small scattered brain infarcts on CTA, MRI. EXAM: CT HEAD WITHOUT CONTRAST TECHNIQUE: Contiguous axial images were obtained from the base of the skull through the vertex without intravenous contrast. RADIATION DOSE REDUCTION: This exam was performed according to the departmental dose-optimization program which includes automated exposure control, adjustment of the mA and/or kV according to patient size and/or use of iterative reconstruction technique. COMPARISON:  Brain MRI 11/11/2023 and earlier FINDINGS: Brain: Posterior left MCA infarcts on MRI remain subtle by CT with no associated hemorrhage or mass effect (series 3, image 20). Small left cerebellum PICA territory infarct is now apparent on CT coronal image 52, and appears different from that on the recent MRI. No associated hemorrhage or mass effect. No acute intracranial hemorrhage identified. No midline shift, mass effect, or evidence of intracranial mass lesion. No ventriculomegaly. Vascular: Calcified atherosclerosis at the skull base. No suspicious intracranial vascular hyperdensity. Skull: Stable and intact. Sinuses/Orbits: Visualized paranasal sinuses and mastoids are stable and well aerated. Other: Mild rightward gaze. No other acute orbit or scalp soft tissue finding. IMPRESSION: 1. New or progressed Left cerebellar PICA territory infarct since the MRI on 11/11/2023. 2. Stable CT appearance of posterior left MCA territory infarcts. 3. No associated acute intracranial hemorrhage or mass effect. Electronically Signed   By: Marlise Simpers M.D.   On: 11/17/2023 10:15   US  CORE BIOPSY (LYMPH NODES) Result Date: 11/16/2023 INDICATION: Right lower lobe lung mass, lymphadenopathy and liver lesions. Enlarged right supraclavicular lymph node targeted for biopsy. EXAM: ULTRASOUND GUIDED CORE BIOPSY OF RIGHT  SUPRACLAVICULAR LYMPH NODE MEDICATIONS: None. ANESTHESIA/SEDATION: Moderate (conscious) sedation was employed during this procedure. A total of Versed  1.5 mg and Fentanyl  75 mcg was administered intravenously. Moderate Sedation Time: 19 minutes. The patient's level of consciousness and vital signs were monitored continuously by radiology nursing throughout the procedure under my direct supervision. PROCEDURE: The procedure, risks, benefits, and alternatives were explained to the patient. Questions regarding the procedure were encouraged  and answered. The patient understands and consents to the procedure. A time-out was performed prior to initiating the procedure. The right neck was prepped with chlorhexidine  in a sterile fashion, and a sterile drape was applied covering the operative field. A sterile gown and sterile gloves were used for the procedure. Local anesthesia was provided with 1% Lidocaine . After localizing an enlarged right supraclavicular lymph node, 18 gauge core biopsy samples were obtained through different portions of the lymph node. A total of 5 samples were obtained with 3 submitted on saline soaked Telfa gauze and 2 in formalin. Additional ultrasound was performed after biopsy. COMPLICATIONS: None immediate. FINDINGS: Several adjacent right supraclavicular lymph nodes are identified by ultrasound. The largest measures approximately 2.2 x 1.4 x 1.7 cm. Solid core biopsy samples were obtained. IMPRESSION: Ultrasound-guided core biopsy performed of an enlarged right supraclavicular lymph node measuring 2.2 cm in greatest diameter by ultrasound. Electronically Signed   By: Erica Hau M.D.   On: 11/16/2023 16:02   ECHOCARDIOGRAM COMPLETE Result Date: 11/12/2023    ECHOCARDIOGRAM REPORT   Patient Name:   SINDI DUDDY Date of Exam: 11/11/2023 Medical Rec #:  [id number]    Height:       64.0 in Accession #:    [id number]   Weight:       180.0 lb Date of Birth:  [birth year]     BSA:          1.871 m  Patient Age:    70 years     BP:           110/92 mmHg Patient Gender: F            HR:           80 bpm. Exam Location:  [facility] Procedure: 2D Echo, Cardiac Doppler and Color Doppler (Both Spectral and Color            Flow Doppler were utilized during procedure). Indications:     Elevated Troponin  History:         Patient has prior history of Echocardiogram examinations, most                  recent 04/25/2022. COPD, Arrythmias:Atrial Fibrillation; Risk                  Factors:Hypertension and Dyslipidemia. Obstructive sleep apnea.  Sonographer:     Brigid Canada RDCS Referring Phys:  [id number] Haynes Lips GLOV Diagnosing Phys: Belva Boyden MD IMPRESSIONS  1. Left ventricular ejection fraction, by estimation, is 60 to 65%. The left ventricle has normal function. The left ventricle has no regional wall motion abnormalities. There is mild left ventricular hypertrophy. Left ventricular diastolic parameters are consistent with Grade I diastolic dysfunction (impaired relaxation).  2. Right ventricular systolic function is normal. The right ventricular size is normal. There is normal pulmonary artery systolic pressure. The estimated right ventricular systolic pressure is 21.3 mmHg.  3. The mitral valve is normal in structure. Mild to moderate mitral valve regurgitation. No evidence of mitral stenosis.  4. The aortic valve is normal in structure. Aortic valve regurgitation is not visualized. No aortic stenosis is present.  5. The inferior vena cava is normal in size with greater than 50% respiratory variability, suggesting right atrial pressure of 3 mmHg. FINDINGS  Left Ventricle: Left ventricular ejection fraction, by estimation, is 60 to 65%. The left ventricle has normal function. The left ventricle has no regional wall motion abnormalities. Strain was performed and the  global longitudinal strain is indeterminate. The left ventricular internal cavity size was normal in size. There is mild left ventricular  hypertrophy. Left ventricular diastolic parameters are consistent with Grade I diastolic dysfunction (impaired relaxation). Right Ventricle: The right ventricular size is normal. No increase in right ventricular wall thickness. Right ventricular systolic function is normal. There is normal pulmonary artery systolic pressure. The tricuspid regurgitant velocity is 2.02 m/s, and  with an assumed right atrial pressure of 5 mmHg, the estimated right ventricular systolic pressure is 21.3 mmHg. Left Atrium: Left atrial size was normal in size. Right Atrium: Right atrial size was normal in size. Pericardium: There is no evidence of pericardial effusion. Mitral Valve: The mitral valve is normal in structure. Mild to moderate mitral valve regurgitation. No evidence of mitral valve stenosis. Tricuspid Valve: The tricuspid valve is normal in structure. Tricuspid valve regurgitation is not demonstrated. No evidence of tricuspid stenosis. Aortic Valve: The aortic valve is normal in structure. Aortic valve regurgitation is not visualized. No aortic stenosis is present. Pulmonic Valve: The pulmonic valve was normal in structure. Pulmonic valve regurgitation is not visualized. No evidence of pulmonic stenosis. Aorta: The aortic root is normal in size and structure. Venous: The inferior vena cava is normal in size with greater than 50% respiratory variability, suggesting right atrial pressure of 3 mmHg. IAS/Shunts: No atrial level shunt detected by color flow Doppler. Additional Comments: 3D was performed not requiring image post processing on an independent workstation and was indeterminate.  LEFT VENTRICLE PLAX 2D LVIDd:         4.90 cm   Diastology LVIDs:         3.10 cm   LV e' medial:    9.36 cm/s LV PW:         1.00 cm   LV E/e' medial:  9.1 LV IVS:        1.00 cm   LV e' lateral:   9.32 cm/s LVOT diam:     2.00 cm   LV E/e' lateral: 9.2 LV SV:         56 LV SV Index:   30 LVOT Area:     3.14 cm  RIGHT VENTRICLE RV Basal diam:   3.30 cm RV S prime:     18.33 cm/s TAPSE (M-mode): 3.2 cm LEFT ATRIUM             Index        RIGHT ATRIUM           Index LA diam:        3.90 cm 2.08 cm/m   RA Area:     11.60 cm LA Vol (A2C):   50.9 ml 27.21 ml/m  RA Volume:   27.60 ml  14.75 ml/m LA Vol (A4C):   38.2 ml 20.42 ml/m LA Biplane Vol: 44.4 ml 23.73 ml/m  AORTIC VALVE LVOT Vmax:   98.00 cm/s LVOT Vmean:  64.000 cm/s LVOT VTI:    0.179 m  AORTA Ao Root diam: 3.30 cm MITRAL VALVE                TRICUSPID VALVE MV Area (PHT): 3.65 cm     TR Peak grad:   16.3 mmHg MV Decel Time: 208 msec     TR Vmax:        202.00 cm/s MR Peak grad: 82.4 mmHg MR Vmax:      454.00 cm/s   SHUNTS MV E velocity: 85.55 cm/s   Systemic VTI:  0.18 m MV A velocity: 123.50 cm/s  Systemic Diam: 2.00 cm MV E/A ratio:  0.69 Belva Boyden MD Electronically signed by Belva Boyden MD Signature Date/Time: 11/12/2023/1:59:01 PM    Final    MR ABDOMEN MRCP W WO CONTAST Result Date: 11/12/2023 CLINICAL DATA:  Evaluate liver lesions.  Chest mass. EXAM: MRI ABDOMEN WITHOUT AND WITH CONTRAST (INCLUDING MRCP) TECHNIQUE: Multiplanar multisequence MR imaging of the abdomen was performed both before and after the administration of intravenous contrast. Heavily T2-weighted images of the biliary and pancreatic ducts were obtained, and three-dimensional MRCP images were rendered by post processing. CONTRAST:  8mL GADAVIST  GADOBUTROL  1 MMOL/ML IV SOLN COMPARISON:  CT AP 11/10/2023 FINDINGS: Lower chest: Small right pleural effusion. Partially visualized mass within the superior segment of right lower lobe noted. Hepatobiliary: There are multifocal bilobar, ill-defined areas of mild increased T2 signal noted within both lobes of liver which show hypoenhancement on the postcontrast images and restricted diffusion. At least 5 lesions are noted involving both lobes. The largest lesion is in segment 5 measuring 1.1 cm, image 17/8. Lesion in segment 2/3 measures 6 mm, image 10/8. Status post  cholecystectomy. Mild intrahepatic bile duct dilatation. Fusiform dilatation of the common bile duct which measures 1.3 cm proximally. No signs of choledocholithiasis or mass. Pancreas:  No main duct dilatation, inflammation or mass identified. Spleen: Within the proximal portion of the spleen there are 2 wedge-shaped areas of peripheral hypoenhancement, image 25/31. Adrenals/Urinary Tract: There is a 1.3 cm nodule in the left adrenal gland which is indeterminate with only mild loss of signal on the out of phase sequences, image 29/9. This is a new finding when compared with the remote CT of the abdomen pelvis from 09/17/2020. Normal right adrenal gland. No kidney mass or signs of obstructive uropathy. Stomach/Bowel: Visualized portions within the abdomen are unremarkable. Vascular/Lymphatic: No aneurysm. Aortic atherosclerosis. No adenopathy. Other:  No ascites. Musculoskeletal: Multifocal abnormal areas of enhancement are identified throughout the visualized portions of the thoracic and lumbar spine which are concerning for osseous metastasis. IMPRESSION: 1. There are multifocal bilobar, ill-defined areas of mild increased T2 signal noted within both lobes of liver which show hypoenhancement on the postcontrast images and restricted diffusion. At least 5 lesions are noted involving both lobes. 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 2. Multifocal abnormal areas of enhancement are identified throughout the visualized portions of the thoracic and lumbar spine which are concerning for osseous metastasis. 3. There are 2 wedge-shaped areas of peripheral hypoenhancement within the proximal portion of the spleen which are concerning for splenic infarcts. 4. There is a 1.3 cm nodule in the left adrenal gland which is indeterminate with only mild loss of signal on the out of phase sequences. This is a new finding when compared with the remote CT of the abdomen pelvis from 09/17/2020. Differential considerations  include lipid poor adenoma versus a adrenal metastasis. 5. Small right pleural effusion. 6. Partially visualized mass within the superior segment of right lower lobe. 7. Status post cholecystectomy. Mild intrahepatic bile duct dilatation. Fusiform dilatation of the common bile duct which measures 1.3 cm proximally. No signs of choledocholithiasis or mass. Electronically Signed   By: Kimberley Penman M.D.   On: 11/12/2023 05:07   MR 3D Recon At Scanner Result Date: 11/12/2023 CLINICAL DATA:  Evaluate liver lesions.  Chest mass. EXAM: MRI ABDOMEN WITHOUT AND WITH CONTRAST (INCLUDING MRCP) TECHNIQUE: Multiplanar multisequence MR imaging of the abdomen was performed both before and after the administration of intravenous  contrast. Heavily T2-weighted images of the biliary and pancreatic ducts were obtained, and three-dimensional MRCP images were rendered by post processing. CONTRAST:  8mL GADAVIST  GADOBUTROL  1 MMOL/ML IV SOLN COMPARISON:  CT AP 11/10/2023 FINDINGS: Lower chest: Small right pleural effusion. Partially visualized mass within the superior segment of right lower lobe noted. Hepatobiliary: There are multifocal bilobar, ill-defined areas of mild increased T2 signal noted within both lobes of liver which show hypoenhancement on the postcontrast images and restricted diffusion. At least 5 lesions are noted involving both lobes. The largest lesion is in segment 5 measuring 1.1 cm, image 17/8. Lesion in segment 2/3 measures 6 mm, image 10/8. Status post cholecystectomy. Mild intrahepatic bile duct dilatation. Fusiform dilatation of the common bile duct which measures 1.3 cm proximally. No signs of choledocholithiasis or mass. Pancreas:  No main duct dilatation, inflammation or mass identified. Spleen: Within the proximal portion of the spleen there are 2 wedge-shaped areas of peripheral hypoenhancement, image 25/31. Adrenals/Urinary Tract: There is a 1.3 cm nodule in the left adrenal gland which is indeterminate  with only mild loss of signal on the out of phase sequences, image 29/9. This is a new finding when compared with the remote CT of the abdomen pelvis from 09/17/2020. Normal right adrenal gland. No kidney mass or signs of obstructive uropathy. Stomach/Bowel: Visualized portions within the abdomen are unremarkable. Vascular/Lymphatic: No aneurysm. Aortic atherosclerosis. No adenopathy. Other:  No ascites. Musculoskeletal: Multifocal abnormal areas of enhancement are identified throughout the visualized portions of the thoracic and lumbar spine which are concerning for osseous metastasis. IMPRESSION: 1. There are multifocal bilobar, ill-defined areas of mild increased T2 signal noted within both lobes of liver which show hypoenhancement on the postcontrast images and restricted diffusion. At least 5 lesions are noted involving both lobes. 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 2. Multifocal abnormal areas of enhancement are identified throughout the visualized portions of the thoracic and lumbar spine which are concerning for osseous metastasis. 3. There are 2 wedge-shaped areas of peripheral hypoenhancement within the proximal portion of the spleen which are concerning for splenic infarcts. 4. There is a 1.3 cm nodule in the left adrenal gland which is indeterminate with only mild loss of signal on the out of phase sequences. This is a new finding when compared with the remote CT of the abdomen pelvis from 09/17/2020. Differential considerations include lipid poor adenoma versus a adrenal metastasis. 5. Small right pleural effusion. 6. Partially visualized mass within the superior segment of right lower lobe. 7. Status post cholecystectomy. Mild intrahepatic bile duct dilatation. Fusiform dilatation of the common bile duct which measures 1.3 cm proximally. No signs of choledocholithiasis or mass. Electronically Signed   By: Kimberley Penman M.D.   On: 11/12/2023 05:07   CT ANGIO HEAD NECK W WO CM Addendum  Date: 11/11/2023 ADDENDUM REPORT: 11/11/2023 23:37 ADDENDUM: Findings discussed with Brenda Morisson, NP via telephone at 10:33 p.m. Electronically Signed   By: Stevenson Elbe M.D.   On: 11/11/2023 23:37   Result Date: 11/11/2023 CLINICAL DATA:  Stroke/TIA, determine embolic source EXAM: CT ANGIOGRAPHY HEAD AND NECK WITH AND WITHOUT CONTRAST TECHNIQUE: Multidetector CT imaging of the head and neck was performed using the standard protocol during bolus administration of intravenous contrast. Multiplanar CT image reconstructions and MIPs were obtained to evaluate the vascular anatomy. Carotid stenosis measurements (when applicable) are obtained utilizing NASCET criteria, using the distal internal carotid diameter as the denominator. RADIATION DOSE REDUCTION: This exam was performed according to the departmental  dose-optimization program which includes automated exposure control, adjustment of the mA and/or kV according to patient size and/or use of iterative reconstruction technique. CONTRAST:  75mL OMNIPAQUE  IOHEXOL  350 MG/ML SOLN COMPARISON:  Same day MRI head. FINDINGS: CTA NECK FINDINGS Aortic arch: Aortic atherosclerosis. 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 Great vessel origins are patent. Right carotid system: Atherosclerosis at the carotid bifurcation without greater than 50% stenosis. Left carotid system: Atherosclerosis at the carotid bifurcation without greater than 50% stenosis. Vertebral arteries: Codominant. No evidence of dissection, stenosis (50% or greater), or occlusion. Skeleton: No evidence of acute abnormality on limited assessment. Multilevel degenerative change. Other neck: Approximately 2.3 cm left thyroid  nodule. Upper chest: Please see same day CT chest for intrathoracic findings. Emphysema. Review of the MIP images confirms the above findings CTA HEAD FINDINGS Anterior circulation: Bilateral intracranial ICAs are patent. Approximately 2 mm  medially directed outpouching arising from the right cavernous ICA, which could represent an infundibulum or aneurysm. Bilateral M1 MCAs are patent. 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 Bilateral ACAs are patent without proximal hemodynamically significant stenosis. Posterior circulation: Bilateral intradural vertebral arteries, basilar artery and bilateral posterior cerebral arteries are patent without proximal hemodynamically significant stenosis. Venous sinuses: As permitted by contrast timing, patent. Review of the MIP images confirms the above findings The ordering provider has been paged at the time of dictation for call of report. IMPRESSION: 1. 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 2. Approximately 2 mm medially directed outpouching arising from the right cavernous ICA, which could represent an infundibulum or aneurysm. 3. Please see same day CT chest for intrathoracic findings. 4. Approximately 2.3 cm left thyroid  nodule. Recommend thyroid  US  (ref: J Am Coll Radiol. 2015 Feb;12(2): 143-50). 5. Aortic Atherosclerosis (ICD10-I70.0) and Emphysema (ICD10-J43.9). Electronically Signed: By: Stevenson Elbe M.D. On: 11/11/2023 23:14   MR BRAIN W WO CONTRAST Result Date: 11/11/2023 CLINICAL DATA:  right facial weakness, malignancy also on the ddx EXAM: MRI HEAD WITHOUT AND WITH CONTRAST TECHNIQUE: Multiplanar, multiecho pulse sequences of the brain and surrounding structures were obtained without and with intravenous contrast. CONTRAST:  8mL GADAVIST  GADOBUTROL  1 MMOL/ML IV SOLN COMPARISON:  CT head November 11, 2023. 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 Mild edema without mass effect. No midline shift. Probable additional punctate acute infarct in the inferior left cerebellum. No evidence of acute hemorrhage, mass lesion, or hydrocephalus. Vascular: Major arterial flow  voids are maintained at the skull base. Skull and upper cervical spine: Normal marrow signal. Sinuses/Orbits: Clear sinuses.  No acute orbital findings. IMPRESSION: 1. 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 Probable additional punctate acute infarct in the inferior left cerebellum. These results will be called to the ordering clinician or representative by the Radiologist Assistant, and communication documented in the PACS or [employer]. Electronically Signed   By: Stevenson Elbe M.D.   On: 11/11/2023 20:27   CT CHEST W CONTRAST Result Date: 11/11/2023 CLINICAL DATA:  Liver lesions with history of breast cancer. EXAM: CT CHEST WITH CONTRAST TECHNIQUE: Multidetector CT imaging of the chest was performed during intravenous contrast administration. RADIATION DOSE REDUCTION: This exam was performed according to the departmental dose-optimization program which includes automated exposure control, adjustment of the mA and/or kV according to patient size and/or use of iterative reconstruction technique. CONTRAST:  75mL OMNIPAQUE   IOHEXOL  300 MG/ML  SOLN COMPARISON:  CT abdomen and pelvis 11/10/2023. FINDINGS: Cardiovascular: Heart is borderline enlarged/mildly enlarged. Aorta is normal in size. There is no pericardial effusion. There are atherosclerotic calcifications of the aorta. Mediastinum/Nodes: There is a heterogeneous left thyroid  nodule attaining cystic and solid components measuring 2.6 x 1.5 cm. There are multiple enlarged right paratracheal lymph nodes measuring up to 2.0 x 2.9 cm. Enlarged subcarinal lymph node measures 1.2 cm short axis. There is a prominent right lower esophageal lymph node measuring 8 mm short axis. There are multiple enlarged right hilar lymph nodes measuring up to 1.4 x 1.8 cm. Visualized esophagus is within normal limits. Lungs/Pleura: Mild emphysematous changes are present. There is scarring in both  lung apices. Multifocal airspace disease is seen throughout the right lower lobe, predominantly posteriorly and peripherally. Some areas are slightly nodular for example superior segment measuring 3.2 by 2.0 cm. A small air-fluid level seen in this region. No other air-fluid levels are seen. Additional nodular density seen in the right lower lobe image 5/92 measuring 9 mm. Smaller nodules are seen scattered throughout the right lower lobe measuring 5 mm or less. There is a trace right pleural effusion. There is a right middle lobe nodule measuring 5 mm image 5/122. There are prominent intrapulmonary lymph nodes along the right major fissure. There are 2 mm nodular densities in the left upper lobe. Left lung is otherwise clear. Upper Abdomen: Subcentimeter hypodensities in the liver and left adrenal mass are unchanged. Please see CT abdomen and pelvis performed same day for further description. Musculoskeletal: There is anterior intramuscular chest wall edema on the right. Focal density is seen in the medial right breast extending to the skin surface measuring 1.2 x 2.3 cm image 3/81. There some associated diffuse right breast skin thickening. No acute fracture. There is a single 5 mm sclerotic density in the T3 vertebral body. IMPRESSION: 1. Multifocal airspace disease throughout the right lower lobe, predominantly posteriorly and peripherally. Single area is slightly nodular in the superior segment containing an air-fluid level worrisome for necrotic pneumonia or necrotic nodule. Findings may be infectious/inflammatory, but neoplasm not excluded. 2. Trace right pleural effusion. 3. Mediastinal and right hilar lymphadenopathy. 4. Additional right lower lobe and right middle lobe pulmonary nodules measuring 5 mm. 5. Left thyroid  nodule measuring 2.6 cm. Recommend further evaluation with ultrasound. 6. Focal density in the medial right breast extending to the skin surface measuring 1.2 x 2.3 cm. Correlate with  physical exam. 7. Single 5 mm sclerotic density in the T3 vertebral body. Aortic Atherosclerosis (ICD10-I70.0) and Emphysema (ICD10-J43.9). Electronically Signed   By: Tyron Gallon M.D.   On: 11/11/2023 17:53   CT HEAD WO CONTRAST ( ) Result Date: 11/11/2023 CLINICAL DATA:  Right facial weakness. EXAM: CT HEAD WITHOUT CONTRAST TECHNIQUE: Contiguous axial images were obtained from the base of the skull through the vertex without intravenous contrast. RADIATION DOSE REDUCTION: This exam was performed according to the departmental dose-optimization program which includes automated exposure control, adjustment of the mA and/or kV according to patient size and/or use of iterative reconstruction technique. COMPARISON:  Head CT 07/21/2022 FINDINGS: Brain: There are new small hypodensities in the left frontoparietal white matter at the level of the posterior centrum semiovale (series 3, image 20), and there is a new subcentimeter focus of cortical hypodensity at the level of the left parietal operculum (series 6, image 46), suspicious for recent infarcts. No intracranial hemorrhage, mass, midline shift, or extra-axial fluid collection is identified. Cerebral  volume is normal. The ventricles are normal in size. Vascular: No hyperdense vessel. Skull: No acute fracture or suspicious lesion. Sinuses/Orbits: Visualized paranasal sinuses and mastoid air cells are clear. Bilateral cataract extraction. Other: None. 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 A brain MRI is in progress and will provide further evaluation. Electronically Signed   By: Aundra Lee M.D.   On: 11/11/2023 15:13   DG Chest 2 View Result Date: 11/10/2023 CLINICAL DATA:  dyspnea EXAM: CHEST - 2 VIEW COMPARISON:  Chest x-ray 07/21/2022, chest x-ray 02/15/2021 FINDINGS: The heart and mediastinal contours are unchanged. Prominent hilar vasculature. No focal consolidation. No pulmonary edema. No pleural  effusion. No pneumothorax. No acute osseous abnormality. IMPRESSION: Prominent hilar vasculature suggestive of pulmonary venous congestion. Underlying lymph nodes not excluded. Electronically Signed   By: Morgane  Naveau M.D.   On: 11/10/2023 23:50   CT ABDOMEN PELVIS W CONTRAST Result Date: 11/10/2023 CLINICAL DATA:  Acute nonlocalized abdominal pain. Vomiting and diarrhea. EXAM: CT ABDOMEN AND PELVIS WITH CONTRAST TECHNIQUE: Multidetector CT imaging of the abdomen and pelvis was performed using the standard protocol following bolus administration of intravenous contrast. RADIATION DOSE REDUCTION: This exam was performed according to the departmental dose-optimization program which includes automated exposure control, adjustment of the mA and/or kV according to patient size and/or use of iterative reconstruction technique. CONTRAST:  OMNIPAQUE  IOHEXOL  300 MG/ML  SOLN COMPARISON:  None Available. FINDINGS: Lower chest: Focal consolidation suggested in the right lung base with small right pleural effusion. This may represent pneumonia or less likely aspiration. Emphysematous changes in the lungs. Hepatobiliary: Multiple scattered low-attenuation lesions in the liver, most are subcentimeter in size. Largest is in the medial segment left lobe measuring 1.3 cm in diameter. Enhancement pattern is indeterminate. Consider follow-up MRI for further characterization. The gallbladder is not visualized, likely surgically absent. Mild intrahepatic bile duct dilatation is most likely due to postoperative change. No common duct stones are identified. Pancreas: Unremarkable. No pancreatic ductal dilatation or surrounding inflammatory changes. Spleen: Segmental low-attenuation in the upper spleen likely represents a splenic infarct. Spleen size is normal. Adrenals/Urinary Tract: 1.3 cm diameter left adrenal gland nodule with density measurement of 69 Hounsfield units. Renal nephrograms are symmetrical. No solid mass  identified. No hydronephrosis or hydroureter. Bladder is normal. Stomach/Bowel: Stomach is within normal limits. Appendix appears normal. No evidence of bowel wall thickening, distention, or inflammatory changes. Vascular/Lymphatic: Aortic atherosclerosis. No enlarged abdominal or pelvic lymph nodes. Reproductive: Uterus and bilateral adnexa are unremarkable. Other: No abdominal wall hernia or abnormality. No abdominopelvic ascites. Musculoskeletal: Postoperative fixation of the right hip. No acute bony abnormalities. IMPRESSION: 1. Nonspecific low-attenuation lesions in the liver, largest measuring 1.3 cm diameter. Enhancement pattern is indeterminate. Consider follow-up MRI for further characterization. 2. Mild intrahepatic bile duct dilatation is likely postoperative in the setting of cholecystectomy. 3. Left adrenal mass measuring 1.3 cm, probable benign adenoma. Recommend follow-up adrenal washout CT in 1 year. If stable for = 1 year, no further follow-up imaging. JACR 2017 Aug; 14(8):1038-44, JCAT 2016 Mar-Apr; 40(2):194-200, Urol J 2006 Spring; 3(2):71-4. 4. Aortic atherosclerosis. 5. Focal area of consolidation suggested in the right lung base with small right pleural effusion, likely pneumonia. Electronically Signed   By: Boyce Byes M.D.   On: 11/10/2023 23:30

## 2023-12-30 NOTE — Assessment & Plan Note (Signed)
 Right breast cT2 cN0 invasive carcinoma, ER/PR positive, HER2 positive -->neoadjuvant chemo S/p TCH x2, and TCHP x4 followed by lumpectomy and sentinel lymph node biopsy. ypT1a ypN0- Residual disease after neoadjuvant chemotherapy, status post adjuvant radiation S/p 1 year of Kadcyla  --> 10/2023 CT/MRI showed recurrence --> biopsy showed metastatic adenocarcinoma, breast origin. Triple negative.  Images were reviewed and discussed with patient.  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  PET scan was reviewed.  Patient has hypermetabolic active right lower lobe mass with extensive intrathoracic lymph node, liver, bone, adrenal involvement -raising suspicion of primary lung cancer tissue origin.  Discussed with pathology for additional testing. Overall she tolerated weekly Taxol  with mild difficulties. Lab results reviewed with patient. Proceed with Taxol  today.  Send off NGS, PD-L1, if >=10, consider add pembrolizumab.

## 2023-12-30 NOTE — Assessment & Plan Note (Signed)
 Continue slow mag 64mg  oral daily

## 2023-12-30 NOTE — Progress Notes (Signed)
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 IMPLANTED PORT A CATH PLACEMENT WITH ULTRASOUND AND FLUOROSCOPIC GUIDANCE MEDICATIONS: None ANESTHESIA/SEDATION: Versed  1 mg IV; Fentanyl  50 mcg IV; administered by the radiology nurse Moderate Sedation Time:  16 minutes The patient's vital signs and level of consciousness were continuously monitored during the procedure by the interventional radiology nurse under my direct supervision. FLUOROSCOPY: Radiation exposure index: 1 mGy reference air kerma COMPLICATIONS: None immediate. PROCEDURE: The right neck and chest was prepped with chlorhexidine , and draped in the  usual sterile fashion using maximum barrier technique (cap and mask, sterile gown, sterile gloves, large sterile sheet, hand hygiene and cutaneous antiseptic). Local anesthesia was attained by infiltration with 1% lidocaine  with epinephrine . Ultrasound demonstrated patency of the right internal jugular vein, and this was documented with an image. Under real-time ultrasound guidance, this vein was accessed with a 21 gauge micropuncture needle and image documentation was performed. A small dermatotomy was made at the access  site with an 11 scalpel. A 0.018" wire was advanced into the SVC and the access needle exchanged for a 25F micropuncture vascular sheath. The 0.018" wire was then removed and a 0.035" wire advanced into the IVC. An appropriate location for the subcutaneous reservoir was selected below the clavicle and an incision was made through the skin and underlying soft tissues. The subcutaneous tissues were then dissected using a combination of blunt and sharp surgical technique and a pocket was formed. A Bard Clear Vue single lumen power injectable portacatheter was then tunneled through the subcutaneous tissues from the pocket to the dermatotomy and the port reservoir placed within the subcutaneous pocket. The venous access site was then serially dilated and a peel away vascular sheath placed over the wire. The wire was removed and the port catheter advanced into position under fluoroscopic guidance. The catheter tip is positioned in the upper right atrium. This was documented with a spot image. The portacatheter was then tested and found to flush and aspirate well. The port was flushed with saline followed by 100 units/mL heparinized saline. The pocket was then closed in two layers using first subdermal inverted interrupted absorbable sutures followed by a running subcuticular suture. The epidermis was then sealed with Dermabond. The dermatotomy at the venous access site was also closed with Dermabond. IMPRESSION: Successful placement of a right IJ approach Bard Clear Vue port catheter with ultrasound and fluoroscopic guidance. The catheter is ready for use. Electronically Signed   By: Fernando Hoyer M.D.   On: 12/14/2023 13:17    PERFORMANCE STATUS (ECOG) : 2 - Symptomatic, <50% confined to bed  Review of Systems Unless otherwise noted, a complete review of systems is negative.  Physical Exam General: Frail appearing Pulmonary: Unlabored, on O2 Extremities: no edema, no joint deformities Skin: no  rashes Neurological: Weakness but otherwise nonfocal  IMPRESSION: Follow-up visit.  Patient accompanied by husband.  Reviewed results of PET scan.  Patient with extensive metastatic disease with progression to right lung mass and findings concerning for lymphangitic spread.  Patient is being referred for possible XRT.  Discussed overall goals.  Patient tearful as she described at times feeling like she should not continue treatments.  However, patient states that she is interested in speaking with radiation oncology.  Emotional support provided.  Pain is reportedly stable on current regimen of MS Contin /oxycodone .  Will refill MS Contin .  Will taper dose of dexamethasone .  PLAN: - Continue current scope of treatment - Continue MS Contin /oxycodone  - Reduce dexamethasone  1 mg daily - PDMP reviewed - Daily bowel regimen - Continue as needed antiemetics - DNR/DNI - RTC 2-3 weeks  Case and plan discussed with Dr. Wilhelmenia Harada   Patient expressed understanding and was in agreement with this plan. She also understands that She can call the clinic at any time with any questions, concerns, or complaints.     Time Total: 20 minutes  Visit consisted of counseling and education dealing with the complex and emotionally intense issues of symptom management and palliative care in the setting of  serious and potentially life-threatening illness.Greater than 50%  of this time was spent counseling and coordinating care related to the above assessment and plan.  Signed by: Gerilyn Kobus, PhD, NP-C

## 2023-12-30 NOTE — Assessment & Plan Note (Signed)
 Continue Lovenox  1 mg/kg subcutaneous twice daily. Continue statin

## 2023-12-30 NOTE — Assessment & Plan Note (Signed)
 Continue potassium chloride  20 mEq once daily.

## 2023-12-30 NOTE — Assessment & Plan Note (Signed)
 Continue MS Contin  15 mg twice daily, oxycodone  5 to 10 mg every 4 hours as needed for pain. PET scan showed extensive bone involvement including lumbar spine.  Refer to radiation oncology for palliative radiation.

## 2023-12-31 ENCOUNTER — Other Ambulatory Visit: Payer: Self-pay

## 2023-12-31 ENCOUNTER — Telehealth: Payer: Self-pay

## 2023-12-31 DIAGNOSIS — C50919 Malignant neoplasm of unspecified site of unspecified female breast: Secondary | ICD-10-CM

## 2023-12-31 NOTE — Telephone Encounter (Addendum)
 Spoke to pt and son and informed them that Dr. Wilhelmenia Harada reveiwed PET scan and there is a possibilty of lung primary. She recommends a bone marrow biospy to obtain more tissue. Pt would not need to hold Lovenox  prior to this procedure.  Pt and son are agreeable to BM bx. She is scheduled for bx on 5/22 @ 9:30, arrive 8:30.

## 2024-01-05 NOTE — Telephone Encounter (Signed)
 Tumor origin testing requested

## 2024-01-06 ENCOUNTER — Telehealth: Payer: Self-pay | Admitting: Oncology

## 2024-01-06 NOTE — Telephone Encounter (Signed)
 Patients Husband called to cancel Bone Marrow Biopsy- He states she does not want to have the biopsy.   Please cancel the bone marrow biopsy.   He also stated she may not come for lab tomorrow and blood Friday. I asked him to call when he know if she is coming and he confirmed that he would.

## 2024-01-07 ENCOUNTER — Other Ambulatory Visit

## 2024-01-07 ENCOUNTER — Ambulatory Visit

## 2024-01-07 ENCOUNTER — Other Ambulatory Visit: Payer: Self-pay | Admitting: Oncology

## 2024-01-07 ENCOUNTER — Inpatient Hospital Stay

## 2024-01-07 ENCOUNTER — Ambulatory Visit: Admitting: Oncology

## 2024-01-07 ENCOUNTER — Ambulatory Visit: Admission: RE | Admit: 2024-01-07 | Source: Ambulatory Visit | Admitting: Radiology

## 2024-01-07 DIAGNOSIS — D649 Anemia, unspecified: Secondary | ICD-10-CM

## 2024-01-07 DIAGNOSIS — C50919 Malignant neoplasm of unspecified site of unspecified female breast: Secondary | ICD-10-CM

## 2024-01-07 DIAGNOSIS — Z5111 Encounter for antineoplastic chemotherapy: Secondary | ICD-10-CM | POA: Diagnosis not present

## 2024-01-07 LAB — CBC WITH DIFFERENTIAL (CANCER CENTER ONLY)
Abs Immature Granulocytes: 0.44 10*3/uL — ABNORMAL HIGH (ref 0.00–0.07)
Basophils Absolute: 0.1 10*3/uL (ref 0.0–0.1)
Basophils Relative: 1 %
Eosinophils Absolute: 0.1 10*3/uL (ref 0.0–0.5)
Eosinophils Relative: 1 %
HCT: 28.2 % — ABNORMAL LOW (ref 36.0–46.0)
Hemoglobin: 8.9 g/dL — ABNORMAL LOW (ref 12.0–15.0)
Immature Granulocytes: 4 %
Lymphocytes Relative: 17 %
Lymphs Abs: 1.8 10*3/uL (ref 0.7–4.0)
MCH: 30.8 pg (ref 26.0–34.0)
MCHC: 31.6 g/dL (ref 30.0–36.0)
MCV: 97.6 fL (ref 80.0–100.0)
Monocytes Absolute: 0.9 10*3/uL (ref 0.1–1.0)
Monocytes Relative: 9 %
Neutro Abs: 7.5 10*3/uL (ref 1.7–7.7)
Neutrophils Relative %: 68 %
Platelet Count: 263 10*3/uL (ref 150–400)
RBC: 2.89 MIL/uL — ABNORMAL LOW (ref 3.87–5.11)
RDW: 18.3 % — ABNORMAL HIGH (ref 11.5–15.5)
WBC Count: 10.8 10*3/uL — ABNORMAL HIGH (ref 4.0–10.5)
nRBC: 0.7 % — ABNORMAL HIGH (ref 0.0–0.2)

## 2024-01-07 LAB — SAMPLE TO BLOOD BANK

## 2024-01-07 LAB — PREPARE RBC (CROSSMATCH)

## 2024-01-08 ENCOUNTER — Inpatient Hospital Stay

## 2024-01-08 DIAGNOSIS — Z5111 Encounter for antineoplastic chemotherapy: Secondary | ICD-10-CM | POA: Diagnosis not present

## 2024-01-08 DIAGNOSIS — C50919 Malignant neoplasm of unspecified site of unspecified female breast: Secondary | ICD-10-CM

## 2024-01-08 DIAGNOSIS — D649 Anemia, unspecified: Secondary | ICD-10-CM

## 2024-01-08 MED ORDER — HEPARIN SOD (PORK) LOCK FLUSH 100 UNIT/ML IV SOLN
500.0000 [IU] | Freq: Every day | INTRAVENOUS | Status: AC | PRN
  Administered 2024-01-08: 500 [IU]
  Filled 2024-01-08: qty 5

## 2024-01-08 MED ORDER — DIPHENHYDRAMINE HCL 25 MG PO CAPS
25.0000 mg | ORAL_CAPSULE | Freq: Once | ORAL | Status: AC
  Administered 2024-01-08: 25 mg via ORAL
  Filled 2024-01-08: qty 1

## 2024-01-08 MED ORDER — SODIUM CHLORIDE 0.9% IV SOLUTION
250.0000 mL | INTRAVENOUS | Status: DC
  Administered 2024-01-08: 250 mL via INTRAVENOUS
  Filled 2024-01-08: qty 250

## 2024-01-08 MED ORDER — ACETAMINOPHEN 325 MG PO TABS
650.0000 mg | ORAL_TABLET | Freq: Once | ORAL | Status: AC
  Administered 2024-01-08: 650 mg via ORAL
  Filled 2024-01-08: qty 2

## 2024-01-09 LAB — BPAM RBC
Blood Product Expiration Date: 202506252359
ISSUE DATE / TIME: 202505231340
Unit Type and Rh: 6200

## 2024-01-09 LAB — TYPE AND SCREEN
ABO/RH(D): A POS
Antibody Screen: NEGATIVE
Unit division: 0

## 2024-01-12 ENCOUNTER — Ambulatory Visit

## 2024-01-12 ENCOUNTER — Other Ambulatory Visit

## 2024-01-12 ENCOUNTER — Encounter

## 2024-01-12 ENCOUNTER — Ambulatory Visit: Admitting: Oncology

## 2024-01-13 ENCOUNTER — Inpatient Hospital Stay

## 2024-01-13 ENCOUNTER — Encounter: Payer: Self-pay | Admitting: Oncology

## 2024-01-13 ENCOUNTER — Inpatient Hospital Stay (HOSPITAL_BASED_OUTPATIENT_CLINIC_OR_DEPARTMENT_OTHER): Admitting: Oncology

## 2024-01-13 VITALS — BP 133/71 | HR 89 | Resp 18

## 2024-01-13 VITALS — BP 121/68 | HR 89 | Temp 96.0°F | Resp 18 | Wt 172.5 lb

## 2024-01-13 DIAGNOSIS — C50919 Malignant neoplasm of unspecified site of unspecified female breast: Secondary | ICD-10-CM | POA: Diagnosis not present

## 2024-01-13 DIAGNOSIS — Z5111 Encounter for antineoplastic chemotherapy: Secondary | ICD-10-CM

## 2024-01-13 DIAGNOSIS — E876 Hypokalemia: Secondary | ICD-10-CM | POA: Diagnosis not present

## 2024-01-13 DIAGNOSIS — I6310 Cerebral infarction due to embolism of unspecified precerebral artery: Secondary | ICD-10-CM

## 2024-01-13 DIAGNOSIS — M8580 Other specified disorders of bone density and structure, unspecified site: Secondary | ICD-10-CM

## 2024-01-13 DIAGNOSIS — G893 Neoplasm related pain (acute) (chronic): Secondary | ICD-10-CM

## 2024-01-13 LAB — CBC WITH DIFFERENTIAL (CANCER CENTER ONLY)
Abs Immature Granulocytes: 0.16 10*3/uL — ABNORMAL HIGH (ref 0.00–0.07)
Basophils Absolute: 0.1 10*3/uL (ref 0.0–0.1)
Basophils Relative: 1 %
Eosinophils Absolute: 0.1 10*3/uL (ref 0.0–0.5)
Eosinophils Relative: 1 %
HCT: 32.5 % — ABNORMAL LOW (ref 36.0–46.0)
Hemoglobin: 10.4 g/dL — ABNORMAL LOW (ref 12.0–15.0)
Immature Granulocytes: 1 %
Lymphocytes Relative: 11 %
Lymphs Abs: 1.3 10*3/uL (ref 0.7–4.0)
MCH: 31 pg (ref 26.0–34.0)
MCHC: 32 g/dL (ref 30.0–36.0)
MCV: 97 fL (ref 80.0–100.0)
Monocytes Absolute: 0.8 10*3/uL (ref 0.1–1.0)
Monocytes Relative: 7 %
Neutro Abs: 9.3 10*3/uL — ABNORMAL HIGH (ref 1.7–7.7)
Neutrophils Relative %: 79 %
Platelet Count: 218 10*3/uL (ref 150–400)
RBC: 3.35 MIL/uL — ABNORMAL LOW (ref 3.87–5.11)
RDW: 17.6 % — ABNORMAL HIGH (ref 11.5–15.5)
WBC Count: 11.7 10*3/uL — ABNORMAL HIGH (ref 4.0–10.5)
nRBC: 0 % (ref 0.0–0.2)

## 2024-01-13 LAB — SAMPLE TO BLOOD BANK

## 2024-01-13 LAB — CMP (CANCER CENTER ONLY)
ALT: 35 U/L (ref 0–44)
AST: 19 U/L (ref 15–41)
Albumin: 3.1 g/dL — ABNORMAL LOW (ref 3.5–5.0)
Alkaline Phosphatase: 114 U/L (ref 38–126)
Anion gap: 7 (ref 5–15)
BUN: 15 mg/dL (ref 8–23)
CO2: 29 mmol/L (ref 22–32)
Calcium: 8.9 mg/dL (ref 8.9–10.3)
Chloride: 99 mmol/L (ref 98–111)
Creatinine: 0.58 mg/dL (ref 0.44–1.00)
GFR, Estimated: >60 mL/min (ref >60)
Glucose, Bld: 98 mg/dL (ref 70–99)
Potassium: 4.1 mmol/L (ref 3.5–5.1)
Sodium: 135 mmol/L (ref 135–145)
Total Bilirubin: 0.6 mg/dL (ref 0.0–1.2)
Total Protein: 6.7 g/dL (ref 6.5–8.1)

## 2024-01-13 MED ORDER — POTASSIUM CHLORIDE CRYS ER 10 MEQ PO TBCR: 10.0000 meq | EXTENDED_RELEASE_TABLET | Freq: Two times a day (BID) | ORAL | 0 refills | Status: DC

## 2024-01-13 MED ORDER — FAMOTIDINE IN NACL 20-0.9 MG/50ML-% IV SOLN
20.0000 mg | Freq: Once | INTRAVENOUS | Status: AC
  Administered 2024-01-13: 20 mg via INTRAVENOUS
  Filled 2024-01-13: qty 50

## 2024-01-13 MED ORDER — DEXAMETHASONE SODIUM PHOSPHATE 10 MG/ML IJ SOLN
10.0000 mg | Freq: Once | INTRAMUSCULAR | Status: AC
  Administered 2024-01-13: 10 mg via INTRAVENOUS
  Filled 2024-01-13: qty 1

## 2024-01-13 MED ORDER — SODIUM CHLORIDE 0.9% FLUSH
10.0000 mL | INTRAVENOUS | Status: DC | PRN
  Administered 2024-01-13: 10 mL
  Filled 2024-01-13: qty 10

## 2024-01-13 MED ORDER — SODIUM CHLORIDE 0.9 % IV SOLN
80.0000 mg/m2 | Freq: Once | INTRAVENOUS | Status: AC
  Administered 2024-01-13: 150 mg via INTRAVENOUS
  Filled 2024-01-13: qty 25

## 2024-01-13 MED ORDER — DIPHENHYDRAMINE HCL 50 MG/ML IJ SOLN
50.0000 mg | Freq: Once | INTRAMUSCULAR | Status: AC
  Administered 2024-01-13: 50 mg via INTRAVENOUS
  Filled 2024-01-13: qty 1

## 2024-01-13 MED ORDER — SODIUM CHLORIDE 0.9 % IV SOLN
INTRAVENOUS | Status: DC
  Filled 2024-01-13: qty 250

## 2024-01-13 MED ORDER — HEPARIN SOD (PORK) LOCK FLUSH 100 UNIT/ML IV SOLN
500.0000 [IU] | Freq: Once | INTRAVENOUS | Status: AC | PRN
  Administered 2024-01-13: 500 [IU]
  Filled 2024-01-13: qty 5

## 2024-01-13 NOTE — Assessment & Plan Note (Signed)
 Continue MS Contin  15 mg twice daily, oxycodone  5 to 10 mg every 4 hours as needed for pain. PET scan showed extensive bone involvement including lumbar spine.  Refer to radiation oncology for palliative radiation.

## 2024-01-13 NOTE — Progress Notes (Signed)
 Nutrition Follow-up:  Patient with metastatic breast cancer.  Currently receiving taxol .  Declined bone marrow biopsy.  Met with patient during infusion.  Reports that appetite is good.  Eating all kinds of foods.  Watermelon is tasting good to her.  Drinking boost shakes BID.  Declines nutrition impact symptoms at this time    Medications: reviewed  Labs: reviewed  Anthropometrics:   Weight 172 lb 8 oz on 5/28  173 lb on 5/6 179 lb on 4/29 180 lb on 11/10/23    NUTRITION DIAGNOSIS: Inadequate oral intake improved   INTERVENTION:  Encouraged well balanced diet including foods rich in protein Continue oral nutrition supplements for added nutrition    MONITORING, EVALUATION, GOAL: Weight trends, intake   NEXT VISIT: as needed  Debra [name], CSO, LDN Registered Dietitian [phone]

## 2024-01-13 NOTE — Assessment & Plan Note (Signed)
 Continue Lovenox  1 mg/kg subcutaneous twice daily. Continue statin

## 2024-01-13 NOTE — Assessment & Plan Note (Signed)
Chemotherapy plan as above 

## 2024-01-13 NOTE — Assessment & Plan Note (Signed)
 Continue slow mag 64mg  oral daily

## 2024-01-13 NOTE — Assessment & Plan Note (Signed)
01/15/22 Osteoporosis 10 year FRAX  19.9% Recommend calcium 1200 mg  and vitamin D  daily supplementation.

## 2024-01-13 NOTE — Assessment & Plan Note (Addendum)
 Right breast cT2 cN0 invasive carcinoma, ER/PR positive, HER2 positive -->neoadjuvant chemo S/p TCH x2, and TCHP x4 followed by lumpectomy and sentinel lymph node biopsy. ypT1a ypN0- Residual disease after neoadjuvant chemotherapy, status post adjuvant radiation S/p 1 year of Kadcyla  --> 10/2023 CT/MRI showed recurrence --> biopsy showed metastatic adenocarcinoma, breast origin. Triple negative.  Images were reviewed and discussed with patient.  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  PET scan was reviewed.  Patient has hypermetabolic active right lower lobe mass with extensive intrathoracic lymph node, liver, bone, adrenal involvement -raising suspicion of primary lung cancer tissue origin.  -Check NGS and tissue of origin. Overall she tolerated weekly Taxol  with mild difficulties. Lab results reviewed with patient. Proceed with Taxol  today.  NGS showed NGS KRAS G12A, STK11, DMNT3A, CDKN2A, CKDN2B KEAP1, TMB 5.3, pMMR , PD-L1, TPS 5% consider add pembrolizumab.  Discussed with pathology, pulmonary adenocarcinoma is not completely ruled out.  TTF staining may be misleading due to small tissue quality.  Ideally additional tissue biopsy may help clarify tissue of origin. However due to patient's recurrent embolic stroke, it is of high risk to stop anticoagulation for additional biopsy.  Patient has diffuse bone metastasis, with possible bone marrow involvement.  I recommend bone marrow biopsy for further evaluation.  No need for holding anticoagulation for bone marrow biopsy.  Patient initially canceled the bone marrow biopsy and today after further discussion, she agrees with the plan.

## 2024-01-13 NOTE — Assessment & Plan Note (Signed)
 Continue potassium chloride  20 mEq once daily.

## 2024-01-13 NOTE — Progress Notes (Addendum)
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        Mood and Affect: Mood normal.        LABORATORY DATA:  I have reviewed the data as listed     Latest Ref Rng & Units 01/13/2024   12:43 PM 01/07/2024   10:01 AM 12/30/2023   12:38 PM  CBC  WBC 4.0 - 10.5 K/uL 11.7  10.8  12.4   Hemoglobin 12.0 - 15.0 g/dL 54.0  8.9  8.9   Hematocrit 36.0 - 46.0 % 32.5  28.2  27.7   Platelets 150 - 400 K/uL 218  263  406       Latest Ref Rng & Units 01/13/2024   12:43 PM 12/30/2023   12:38 PM 12/22/2023   10:52 AM  CMP  Glucose 70 - 99 mg/dL 98  981  191   BUN 8 - 23 mg/dL 15  17  24    Creatinine 0.44 - 1.00 mg/dL 4.78  2.95  6.21   Sodium 135 - 145 mmol/L 135  131  135   Potassium 3.5 - 5.1 mmol/L 4.1  4.1  4.0   Chloride 98 - 111 mmol/L 99  92  96   CO2 22 - 32 mmol/L 29  26  27    Calcium  8.9 - 10.3 mg/dL 8.9  8.8  9.4   Total Protein 6.5 - 8.1 g/dL 6.7  6.9  7.4   Total Bilirubin 0.0 - 1.2 mg/dL 0.6  0.6  0.8   Alkaline Phos 38 - 126 U/L  114  77  87   AST 15 - 41 U/L 19  53  25   ALT 0 - 44 U/L 35  85  24     Iron/TIBC/Ferritin/ %Sat    Component Value Date/Time   IRON 73 12/30/2023 1238   TIBC 337 12/30/2023 1238   FERRITIN 659 (H) 12/30/2023 1238   IRONPCTSAT 22 12/30/2023 1238      RADIOGRAPHIC STUDIES: I have personally reviewed the radiological images as listed and agreed with the findings in the report.NM PET Image Initial (PI) Skull Base To Thigh Result Date: 12/28/2023 CLINICAL DATA:  Initial treatment strategy for metastatic breast cancer. EXAM: NUCLEAR MEDICINE PET SKULL BASE TO THIGH TECHNIQUE: 9.78 mCi F-18 FDG was injected intravenously. Full-ring PET imaging was performed from the skull base to thigh after the radiotracer. CT data was obtained and used for attenuation correction and anatomic localization. Fasting blood glucose: 133 mg/dl COMPARISON:  CTA head neck 11/11/2023. MRI abdomen 11/11/2023. Chest CT 11/11/2023. Abdomen pelvis CT 11/10/2023. Multiple older exams. FINDINGS: Mediastinal blood pool activity: SUV max 2.9 Liver activity: SUV max 3.4 NECK: No specific abnormal uptake seen in the lymph node change the neck including submandibular, posterior triangle or internal jugular. Exception is some prominent lymph nodes in the thoracic inlet on the right side which have maximum SUV value of 4.7. These measure up to 7 mm on image 34 series 6. Near symmetric uptake of the visualized intracranial compartment. However there is uptake along lesion in the posterior aspect of the left thyroid  gland or immediately adjacent. This has maximum SUV of  11.6. On prior this was measured at 2.6 x 1.5 cm and today 2.8 by 1.5 cm. Please correlate for any known history or prior workup. It is possible this is a lesion adjacent to the thyroid  and not originating from the thyroid  gland. Incidental CT findings: Prominent vascular calcifications are seen. Paranasal sinuses and mastoid air cells seen are grossly clear. Parotid glands  and submandibular glands are preserved. CHEST: There are several hypermetabolic enlarged nodes identified in the mediastinum on the right side previous includes paratracheal and subcarinal. Additional nodes in the right hilum of the lung as well. Example of a right paratracheal node has maximum SUV of 6.0 and measures 2.5 x 1.4 cm on series 6, image 48. On the prior examination this same lesion measured 3.0 x 2.0 cm in March 2025, smaller. Other nodes also so some improvement in the mediastinum. Right hilum has some areas of uptake approaching maximum SUV of 4.8. In addition there are some abnormal nodes identified along the internal mammary chain on the right with a focus on PET image 52 of maximum SUV value of 5.3. This node is new from prior CT scan. Increasing right-sided pleural effusion. There are reticulonodular areas seen scattered throughout the right lung with masslike areas greatest in the right lower lobe. These have significant abnormal uptake. Focus in the superior segment of the right lower lobe has maximum SUV value of 8.5 and on CT image 52 nodular area measures 3.7 x 2.3 cm. On the prior CT scan there is a smaller focus in this location measuring approximately 14 by 10 mm. Again numerous other areas scattered diffusely in the right lung. In addition there is some pleural thickening at the right costophrenic angle which has some uptake. Is spread of disease to the pleura is possible. Few other areas suggested anteriorly in the right hemithorax. No abnormal uptake in the left lung. Incidental CT findings: Heart is nonenlarged. No pericardial effusion. Diffuse vascular calcifications. Right IJ chest port in place with tip extending to the right atrium. Slightly patulous thoracic esophagus. Breathing motion. No left-sided pleural effusion. Underlying emphysematous lung changes. Nodular thickening along the right breast is again identified. ABDOMEN/PELVIS: There are some scattered foci of abnormal  uptake in the liver consistent with known liver metastases. On MRI example lesion was measured in segment 5 at 11 mm. Today this focus measures 13 mm. Maximum SUV value of 5.7. This is measured on image 87. Few other scattered foci as well are slightly enlarged. Not every lesion shows abnormal uptake on this examination. There is a left adrenal gland area of uptake with maximum SUV value 5.0. Lesion today measures 15 mm on image 84 and on the prior examinations 10 mm. There is physiologic uptake elsewhere along the parenchymal organs, bowel and renal collecting systems. Incidental CT findings: Diffuse colonic stool identified. Uterus is present. Diffuse vascular calcifications. No abnormal calcifications seen within either kidney nor along the course of either ureter. Contracted urinary bladder. Stable dilatation of the biliary tree. Prior cholecystectomy. SKELETON: Multifocal areas of abnormal uptake along the skeleton is consistent with osseous metastatic disease. This includes areas along the spine, femurs, humeri, pelvis, ribs, sternum as well as along the skull. Example of uptake seen at the L2 vertebral body has maximum SUV of 7.3. There is associated areas of compression deformities along several vertebral bodies including L4, L1 which could be more Schmorl's node. Incidental CT findings: Scattered degenerative changes along the spine and other structures. Pins along the right femoral  head. IMPRESSION: Extensive metastatic disease identified. On prior CT scans there were several mediastinal and right hilar nodes. Some of these have decreased in size however there is extensive increase in right lung hypermetabolic mass areas as well as consolidation, interstitial septal thickening and pleural thickening worrisome for lymphangitic spread of disease. Increasing right-sided small pleural effusion. Additional nodes in the thoracic inlet. There is particularly hypermetabolic lesion along the posterior margin of the  left thyroid  gland. This could be medially adjacent to the thyroid  versus emanating directly from the thyroid  gland. Extensive osseous metastatic disease diffusely distributed. Multiple liver metastases identified. Not all lesions seen previously are hypermetabolic compared to background liver activity. A few are larger in size slightly. Increasing size of hypermetabolic left adrenal nodule. Electronically Signed   By: Adrianna Horde M.D.   On: 12/28/2023 13:59   IR IMAGING GUIDED PORT INSERTION Result Date: 12/14/2023 INDICATION: 68 year old female with metastatic breast cancer. She presents for placement of a port catheter to establish durable venous access. EXAM: IMPLANTED PORT A CATH PLACEMENT WITH ULTRASOUND AND FLUOROSCOPIC GUIDANCE MEDICATIONS: None ANESTHESIA/SEDATION: Versed  1 mg IV; Fentanyl  50 mcg IV; administered by the radiology nurse Moderate Sedation Time:  16 minutes The patient's vital signs and level of consciousness were continuously monitored during the procedure by the interventional radiology nurse under my direct supervision. FLUOROSCOPY: Radiation exposure index: 1 mGy reference air kerma COMPLICATIONS: None immediate. PROCEDURE: The right neck and chest was prepped with chlorhexidine , and draped in the usual sterile fashion using maximum barrier technique (cap and mask, sterile gown, sterile gloves, large sterile sheet, hand hygiene and cutaneous antiseptic). Local anesthesia was attained by infiltration with 1% lidocaine  with epinephrine . Ultrasound demonstrated patency of the right internal jugular vein, and this was documented with an image. Under real-time ultrasound guidance, this vein was accessed with a 21 gauge micropuncture needle and image documentation was performed. A small dermatotomy was made at the access site with an 11 scalpel. A 0.018" wire was advanced into the SVC and the access needle exchanged for a 70F micropuncture vascular sheath. The 0.018" wire was then removed and  a 0.035" wire advanced into the IVC. An appropriate location for the subcutaneous reservoir was selected below the clavicle and an incision was made through the skin and underlying soft tissues. The subcutaneous tissues were then dissected using a combination of blunt and sharp surgical technique and a pocket was formed. A Bard Clear Vue single lumen power injectable portacatheter was then tunneled through the subcutaneous tissues from the pocket to the dermatotomy and the port reservoir placed within the subcutaneous pocket. The venous access site was then serially dilated and a peel away vascular sheath placed over the wire. The wire was removed and the port catheter advanced into position under fluoroscopic guidance. The catheter tip is positioned in the upper right atrium. This was documented with a spot image. The portacatheter was then tested and found to flush and aspirate well. The port was flushed with saline followed by 100 units/mL heparinized saline. The pocket was then closed in two layers using first subdermal inverted interrupted absorbable sutures followed by a running subcuticular suture. The epidermis was then sealed with Dermabond. The dermatotomy at the venous access site was also closed with Dermabond. IMPRESSION: Successful placement of a right IJ approach Bard Clear Vue port catheter with ultrasound and fluoroscopic guidance. The catheter is ready for use. Electronically Signed   By: Fernando Hoyer M.D.   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 Recent biopsy of a right supraclavicular lymph node but additional tissue is needed for diagnostic purposes. EXAM: ULTRASOUND-GUIDED CORE BIOPSY OF RIGHT SUPRACLAVICULAR LYMPH NODE MEDICATIONS: Moderate sedation ANESTHESIA/SEDATION: Moderate (conscious) sedation was employed during this procedure. A total of Versed  1 mg and Fentanyl  50 mcg was administered  intravenously by the radiology nurse. Total intra-service moderate Sedation Time: 15 minutes. The patient's level of consciousness and vital signs were monitored continuously by radiology nursing throughout the procedure under my direct supervision. FLUOROSCOPY TIME:  None COMPLICATIONS: None immediate. PROCEDURE: Informed written consent was obtained from the patient after a thorough discussion of the procedural risks, benefits and alternatives. All questions were addressed. A timeout was performed prior to the initiation of the procedure. Ultrasound was used to identify enlarged right supraclavicular lymph nodes. Right supraclavicular area was prepped with chlorhexidine  and sterile field was created. Skin was anesthetized with 1% lidocaine . Small incision was made. Using ultrasound guidance, 17 gauge coaxial needle was directed into a supraclavicular lymph node. Total of 5 core biopsies were obtained with an 18 gauge core device. Specimens placed in formalin. 17 gauge coaxial needle was removed. Bandage placed over the puncture site. FINDINGS: Multiple enlarged right supraclavicular lymph nodes. Core biopsy needle confirmed within the supraclavicular lymph node. No immediate bleeding or hematoma formation. IMPRESSION: Successful ultrasound-guided core biopsy of an enlarged right supraclavicular lymph node. Electronically Signed   By: Elene Griffes M.D.   On: 11/19/2023 16:16   ECHOCARDIOGRAM LIMITED Result Date: 11/18/2023    ECHOCARDIOGRAM LIMITED REPORT   Patient Name:   Debra [name] Date of Exam: 11/17/2023 Medical Rec #:  [id number]    Height:       64.0 in Accession #:    [id number]   Weight:       179.9 lb Date of Birth:  Jun 23, 1956     BSA:          1.870 m Patient Age:    13 years     BP:           114/88 mmHg Patient Gender: F            HR:           84 bpm. Exam Location:  [facility] Procedure: 2D Echo, Limited Echo, Limited Color Doppler and Cardiac Doppler            (Both Spectral and Color Flow Doppler were  utilized during            procedure). Indications:     Elevated Troponin  History:         Patient has prior history of Echocardiogram examinations, most                  recent 11/11/2023. COPD, Arrythmias:Atrial Fibrillation; Risk                  Factors:Hypertension and Dyslipidemia. Obstructive sleep apnea.                  Fatigue. Breast Cancer.  Sonographer:     Brigid Canada RDCS Referring Phys:  [id number] RYAN M DUNN Diagnosing Phys: Sammy Crisp MD IMPRESSIONS  1. Left ventricular ejection fraction, by estimation, is 60 to 65%. The left ventricle has normal function. The left ventricle has no regional wall motion abnormalities. Left ventricular diastolic parameters were normal.  2. Right ventricular systolic function is normal. The right ventricular size is normal. Tricuspid  regurgitation signal is inadequate for assessing PA pressure.  3. The mitral valve is degenerative. At least moderate mitral valve regurgitation, which may be underestimated due to jet eccentricity. No evidence of mitral stenosis.  4. Aortic valve regurgitation is not visualized. No aortic stenosis is present.  5. The inferior vena cava is normal in size with greater than 50% respiratory variability, suggesting right atrial pressure of 3 mmHg. FINDINGS  Left Ventricle: Left ventricular ejection fraction, by estimation, is 60 to 65%. The left ventricle has normal function. The left ventricle has no regional wall motion abnormalities. The left ventricular internal cavity size was normal in size. There is  no left ventricular hypertrophy. Left ventricular diastolic parameters were normal. Right Ventricle: The right ventricular size is normal. No increase in right ventricular wall thickness. Right ventricular systolic function is normal. Tricuspid regurgitation signal is inadequate for assessing PA pressure. Pericardium: There is no evidence of pericardial effusion. Mitral Valve: The mitral valve is degenerative in appearance.  There is moderate thickening of the mitral valve leaflet(s). Moderate mitral valve regurgitation, with wall-impinging jet. No evidence of mitral valve stenosis. Tricuspid Valve: The tricuspid valve is normal in structure. Tricuspid valve regurgitation is trivial. Aortic Valve: Aortic valve regurgitation is not visualized. No aortic stenosis is present. Aortic valve mean gradient measures 6.0 mmHg. Aortic valve peak gradient measures 13.5 mmHg. Aorta: The aortic root is normal in size and structure. Venous: The inferior vena cava is normal in size with greater than 50% respiratory variability, suggesting right atrial pressure of 3 mmHg. LEFT VENTRICLE PLAX 2D LVIDd:         5.20 cm Diastology LVIDs:         2.80 cm LV e' medial:    12.35 cm/s LV PW:         0.70 cm LV E/e' medial:  8.9 LV IVS:        0.70 cm LV e' lateral:   15.75 cm/s                        LV E/e' lateral: 7.0  RIGHT VENTRICLE             IVC RV S prime:     19.85 cm/s  IVC diam: 1.60 cm LEFT ATRIUM         Index LA diam:    3.70 cm 1.98 cm/m  AORTIC VALVE AV Vmax:      183.59 cm/s AV Vmean:     114.120 cm/s AV VTI:       0.317 m AV Peak Grad: 13.5 mmHg AV Mean Grad: 6.0 mmHg  AORTA Ao Root diam: 3.40 cm MITRAL VALVE MV Area (PHT): 3.63 cm MV VTI:        0.40 m MV Decel Time: 209 msec MV E velocity: 109.67 cm/s MV A velocity: 142.67 cm/s MV E/A ratio:  0.77 Veryl Gottron End MD Electronically signed by Sammy Crisp MD Signature Date/Time: 11/18/2023/5:49:00 AM    Final    MR BRAIN WO CONTRAST Result Date: 11/17/2023 CLINICAL DATA:  Provided history: Neuro deficit, acute, stroke suspected. Possible new versus progressed CVA on CTA. Weakness. Aphasia. Facial droop. EXAM: MRI HEAD WITHOUT CONTRAST TECHNIQUE: Multiplanar, multiecho pulse sequences of the brain and surrounding structures were obtained without intravenous contrast. COMPARISON:  Head CT 11/17/2023. CT angiogram head/neck 11/11/2023. Brain MRI 11/11/2023. FINDINGS: Brain: No  age-advanced or lobar predominant cerebral atrophy. Numerous small acute infarcts within the left cerebellar hemisphere and left superior  cerebellar peduncle, the majority of which are new from the prior MRI of 11/11/2023. Several new small acute cortical infarcts are present within the right frontal, right parietal, left parietal and left occipital lobes. A new punctate acute infarct is also present within the left thalamus. Known patchy acute/early subacute infarcts elsewhere within the left frontal and left parietal lobes (MCA vascular territory). 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 Background mild multifocal T2 FLAIR hyperintense signal abnormality within the cerebral white matter, nonspecific but compatible with chronic small vessel ischemic disease. Punctate chronic microhemorrhage within the left occipital lobe, unchanged (series 10, image 25). No evidence of an intracranial mass. No extra-axial fluid collection. No midline shift. Vascular: Please see the recent prior CTA head/neck 11/11/2023. Small foci of susceptibility-weighted signal loss along the left frontal lobe, new from the prior MRI and suspicious for emboli within distal left MCA branches (series 10, image 46). Skull and upper cervical spine: No focal worrisome marrow lesion. Incompletely assessed cervical spondylosis. Sinuses/Orbits: No mass or acute finding within the imaged orbits. Prior bilateral ocular lens replacement. Minimal mucosal thickening within the right maxillary sinus. IMPRESSION: 1. Numerous small acute infarcts within the left cerebellar hemisphere/superior cerebellar peduncle, the majority of which are new from the prior brain MRI of 11/11/2023. Additionally, there are several new small acute cortical infarcts within the right frontal, right parietal, left parietal and left occipital lobes, as well as a new punctate acute infarct within the left thalamus. Involvement of multiple vascular  territories concerning for an embolic process. New small foci of signal abnormality along the left frontal lobe suspicious for emboli within distal left middle cerebral artery branches. 2. Known patchy acute/early subacute infarcts elsewhere within the left frontal and left parietal lobes (MCA vascular territory). 3. Background mild cerebral white matter chronic small vessel ischemic disease. 4. Unchanged chronic lacunar infarct within the right caudate nucleus. Electronically Signed   By: Bascom Lily D.O.   On: 11/17/2023 16:31   DG Chest Portable 1 View Result Date: 11/17/2023 CLINICAL DATA:  Vomiting.  Weakness. EXAM: PORTABLE CHEST 1 VIEW COMPARISON:  11/10/2023 FINDINGS: Bilateral lung fields are clear. Bilateral costophrenic angles are clear. Stable cardio-mediastinal silhouette. No acute osseous abnormalities. The soft tissues are within normal limits. IMPRESSION: No active disease. Electronically Signed   By: Beula Brunswick M.D.   On: 11/17/2023 11:49   CT Head Wo Contrast Result Date: 11/17/2023 CLINICAL DATA:  68 year old female neurologic deficit with slurred speech, right facial droop. Recent left MCA M2 occlusion, posterior left MCA infarcts, occasional other small scattered brain infarcts on CTA, MRI. EXAM: CT HEAD WITHOUT CONTRAST TECHNIQUE: Contiguous axial images were obtained from the base of the skull through the vertex without intravenous contrast. RADIATION DOSE REDUCTION: This exam was performed according to the departmental dose-optimization program which includes automated exposure control, adjustment of the mA and/or kV according to patient size and/or use of iterative reconstruction technique. COMPARISON:  Brain MRI 11/11/2023 and earlier FINDINGS: Brain: Posterior left MCA infarcts on MRI remain subtle by CT with no associated hemorrhage or mass effect (series 3, image 20). Small left cerebellum PICA territory infarct is now apparent on CT coronal image 52, and appears different from  that on the recent MRI. No associated hemorrhage or mass effect. No acute intracranial hemorrhage identified. No midline shift, mass effect, or evidence of intracranial mass lesion. No ventriculomegaly. Vascular: Calcified atherosclerosis at the skull base. No suspicious intracranial vascular hyperdensity. Skull: Stable and intact. Sinuses/Orbits: Visualized paranasal  sinuses and mastoids are stable and well aerated. Other: Mild rightward gaze. No other acute orbit or scalp soft tissue finding. IMPRESSION: 1. New or progressed Left cerebellar PICA territory infarct since the MRI on 11/11/2023. 2. Stable CT appearance of posterior left MCA territory infarcts. 3. No associated acute intracranial hemorrhage or mass effect. Electronically Signed   By: Marlise Simpers M.D.   On: 11/17/2023 10:15   US  CORE BIOPSY (LYMPH NODES) Result Date: 11/16/2023 INDICATION: Right lower lobe lung mass, lymphadenopathy and liver lesions. Enlarged right supraclavicular lymph node targeted for biopsy. EXAM: ULTRASOUND GUIDED CORE BIOPSY OF RIGHT SUPRACLAVICULAR LYMPH NODE MEDICATIONS: None. ANESTHESIA/SEDATION: Moderate (conscious) sedation was employed during this procedure. A total of Versed  1.5 mg and Fentanyl  75 mcg was administered intravenously. Moderate Sedation Time: 19 minutes. The patient's level of consciousness and vital signs were monitored continuously by radiology nursing throughout the procedure under my direct supervision. PROCEDURE: The procedure, risks, benefits, and alternatives were explained to the patient. Questions regarding the procedure were encouraged and answered. The patient understands and consents to the procedure. A time-out was performed prior to initiating the procedure. The right neck was prepped with chlorhexidine  in a sterile fashion, and a sterile drape was applied covering the operative field. A sterile gown and sterile gloves were used for the procedure. Local anesthesia was provided with 1% Lidocaine .  After localizing an enlarged right supraclavicular lymph node, 18 gauge core biopsy samples were obtained through different portions of the lymph node. A total of 5 samples were obtained with 3 submitted on saline soaked Telfa gauze and 2 in formalin. Additional ultrasound was performed after biopsy. COMPLICATIONS: None immediate. FINDINGS: Several adjacent right supraclavicular lymph nodes are identified by ultrasound. The largest measures approximately 2.2 x 1.4 x 1.7 cm. Solid core biopsy samples were obtained. IMPRESSION: Ultrasound-guided core biopsy performed of an enlarged right supraclavicular lymph node measuring 2.2 cm in greatest diameter by ultrasound. Electronically Signed   By: Erica Hau M.D.   On: 11/16/2023 16:02   ECHOCARDIOGRAM COMPLETE Result Date: 11/12/2023    ECHOCARDIOGRAM REPORT   Patient Name:   Debra [name] Date of Exam: 11/11/2023 Medical Rec #:  [id number]    Height:       64.0 in Accession #:    [id number]   Weight:       180.0 lb Date of Birth:  1955/11/20     BSA:          1.871 m Patient Age:    21 years     BP:           110/92 mmHg Patient Gender: F            HR:           80 bpm. Exam Location:  [facility] Procedure: 2D Echo, Cardiac Doppler and Color Doppler (Both Spectral and Color            Flow Doppler were utilized during procedure). Indications:     Elevated Troponin  History:         Patient has prior history of Echocardiogram examinations, most                  recent 04/25/2022. COPD, Arrythmias:Atrial Fibrillation; Risk                  Factors:Hypertension and Dyslipidemia. Obstructive sleep apnea.  Sonographer:     Brigid Canada RDCS Referring Phys:  [id number] [place] [facility] [facility] Diagnosing Phys: Timothy Gollan  MD IMPRESSIONS  1. Left ventricular ejection fraction, by estimation, is 60 to 65%. The left ventricle has normal function. The left ventricle has no regional wall motion abnormalities. There is mild left ventricular hypertrophy. Left ventricular diastolic  parameters are consistent with Grade I diastolic dysfunction (impaired relaxation).  2. Right ventricular systolic function is normal. The right ventricular size is normal. There is normal pulmonary artery systolic pressure. The estimated right ventricular systolic pressure is 21.3 mmHg.  3. The mitral valve is normal in structure. Mild to moderate mitral valve regurgitation. No evidence of mitral stenosis.  4. The aortic valve is normal in structure. Aortic valve regurgitation is not visualized. No aortic stenosis is present.  5. The inferior vena cava is normal in size with greater than 50% respiratory variability, suggesting right atrial pressure of 3 mmHg. FINDINGS  Left Ventricle: Left ventricular ejection fraction, by estimation, is 60 to 65%. The left ventricle has normal function. The left ventricle has no regional wall motion abnormalities. Strain was performed and the global longitudinal strain is indeterminate. The left ventricular internal cavity size was normal in size. There is mild left ventricular hypertrophy. Left ventricular diastolic parameters are consistent with Grade I diastolic dysfunction (impaired relaxation). Right Ventricle: The right ventricular size is normal. No increase in right ventricular wall thickness. Right ventricular systolic function is normal. There is normal pulmonary artery systolic pressure. The tricuspid regurgitant velocity is 2.02 m/s, and  with an assumed right atrial pressure of 5 mmHg, the estimated right ventricular systolic pressure is 21.3 mmHg. Left Atrium: Left atrial size was normal in size. Right Atrium: Right atrial size was normal in size. Pericardium: There is no evidence of pericardial effusion. Mitral Valve: The mitral valve is normal in structure. Mild to moderate mitral valve regurgitation. No evidence of mitral valve stenosis. Tricuspid Valve: The tricuspid valve is normal in structure. Tricuspid valve regurgitation is not demonstrated. No evidence of  tricuspid stenosis. Aortic Valve: The aortic valve is normal in structure. Aortic valve regurgitation is not visualized. No aortic stenosis is present. Pulmonic Valve: The pulmonic valve was normal in structure. Pulmonic valve regurgitation is not visualized. No evidence of pulmonic stenosis. Aorta: The aortic root is normal in size and structure. Venous: The inferior vena cava is normal in size with greater than 50% respiratory variability, suggesting right atrial pressure of 3 mmHg. IAS/Shunts: No atrial level shunt detected by color flow Doppler. Additional Comments: 3D was performed not requiring image post processing on an independent workstation and was indeterminate.  LEFT VENTRICLE PLAX 2D LVIDd:         4.90 cm   Diastology LVIDs:         3.10 cm   LV e' medial:    9.36 cm/s LV PW:         1.00 cm   LV E/e' medial:  9.1 LV IVS:        1.00 cm   LV e' lateral:   9.32 cm/s LVOT diam:     2.00 cm   LV E/e' lateral: 9.2 LV SV:         56 LV SV Index:   30 LVOT Area:     3.14 cm  RIGHT VENTRICLE RV Basal diam:  3.30 cm RV S prime:     18.33 cm/s TAPSE (M-mode): 3.2 cm LEFT ATRIUM             Index        RIGHT ATRIUM  Index LA diam:        3.90 cm 2.08 cm/m   RA Area:     11.60 cm LA Vol (A2C):   50.9 ml 27.21 ml/m  RA Volume:   27.60 ml  14.75 ml/m LA Vol (A4C):   38.2 ml 20.42 ml/m LA Biplane Vol: 44.4 ml 23.73 ml/m  AORTIC VALVE LVOT Vmax:   98.00 cm/s LVOT Vmean:  64.000 cm/s LVOT VTI:    0.179 m  AORTA Ao Root diam: 3.30 cm MITRAL VALVE                TRICUSPID VALVE MV Area (PHT): 3.65 cm     TR Peak grad:   16.3 mmHg MV Decel Time: 208 msec     TR Vmax:        202.00 cm/s MR Peak grad: 82.4 mmHg MR Vmax:      454.00 cm/s   SHUNTS MV E velocity: 85.55 cm/s   Systemic VTI:  0.18 m MV A velocity: 123.50 cm/s  Systemic Diam: 2.00 cm MV E/A ratio:  0.69 Belva Boyden MD Electronically signed by Belva Boyden MD Signature Date/Time: 11/12/2023/1:59:01 PM    Final    MR ABDOMEN MRCP W WO  CONTAST Result Date: 11/12/2023 CLINICAL DATA:  Evaluate liver lesions.  Chest mass. EXAM: MRI ABDOMEN WITHOUT AND WITH CONTRAST (INCLUDING MRCP) TECHNIQUE: Multiplanar multisequence MR imaging of the abdomen was performed both before and after the administration of intravenous contrast. Heavily T2-weighted images of the biliary and pancreatic ducts were obtained, and three-dimensional MRCP images were rendered by post processing. CONTRAST:  8mL GADAVIST  GADOBUTROL  1 MMOL/ML IV SOLN COMPARISON:  CT AP 11/10/2023 FINDINGS: Lower chest: Small right pleural effusion. Partially visualized mass within the superior segment of right lower lobe noted. Hepatobiliary: There are multifocal bilobar, ill-defined areas of mild increased T2 signal noted within both lobes of liver which show hypoenhancement on the postcontrast images and restricted diffusion. At least 5 lesions are noted involving both lobes. The largest lesion is in segment 5 measuring 1.1 cm, image 17/8. Lesion in segment 2/3 measures 6 mm, image 10/8. Status post cholecystectomy. Mild intrahepatic bile duct dilatation. Fusiform dilatation of the common bile duct which measures 1.3 cm proximally. No signs of choledocholithiasis or mass. Pancreas:  No main duct dilatation, inflammation or mass identified. Spleen: Within the proximal portion of the spleen there are 2 wedge-shaped areas of peripheral hypoenhancement, image 25/31. Adrenals/Urinary Tract: There is a 1.3 cm nodule in the left adrenal gland which is indeterminate with only mild loss of signal on the out of phase sequences, image 29/9. This is a new finding when compared with the remote CT of the abdomen pelvis from 09/17/2020. Normal right adrenal gland. No kidney mass or signs of obstructive uropathy. Stomach/Bowel: Visualized portions within the abdomen are unremarkable. Vascular/Lymphatic: No aneurysm. Aortic atherosclerosis. No adenopathy. Other:  No ascites. Musculoskeletal: Multifocal abnormal  areas of enhancement are identified throughout the visualized portions of the thoracic and lumbar spine which are concerning for osseous metastasis. IMPRESSION: 1. There are multifocal bilobar, ill-defined areas of mild increased T2 signal noted within both lobes of liver which show hypoenhancement on the postcontrast images and restricted diffusion. At least 5 lesions are noted involving both lobes. 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 2. Multifocal abnormal areas of enhancement are identified throughout the visualized portions of the thoracic and lumbar spine which are concerning for osseous metastasis. 3. There are 2 wedge-shaped areas of peripheral hypoenhancement within the  proximal portion of the spleen which are concerning for splenic infarcts. 4. There is a 1.3 cm nodule in the left adrenal gland which is indeterminate with only mild loss of signal on the out of phase sequences. This is a new finding when compared with the remote CT of the abdomen pelvis from 09/17/2020. Differential considerations include lipid poor adenoma versus a adrenal metastasis. 5. Small right pleural effusion. 6. Partially visualized mass within the superior segment of right lower lobe. 7. Status post cholecystectomy. Mild intrahepatic bile duct dilatation. Fusiform dilatation of the common bile duct which measures 1.3 cm proximally. No signs of choledocholithiasis or mass. Electronically Signed   By: Kimberley Penman M.D.   On: 11/12/2023 05:07   MR 3D Recon At Scanner Result Date: 11/12/2023 CLINICAL DATA:  Evaluate liver lesions.  Chest mass. EXAM: MRI ABDOMEN WITHOUT AND WITH CONTRAST (INCLUDING MRCP) TECHNIQUE: Multiplanar multisequence MR imaging of the abdomen was performed both before and after the administration of intravenous contrast. Heavily T2-weighted images of the biliary and pancreatic ducts were obtained, and three-dimensional MRCP images were rendered by post processing. CONTRAST:  8mL GADAVIST   GADOBUTROL  1 MMOL/ML IV SOLN COMPARISON:  CT AP 11/10/2023 FINDINGS: Lower chest: Small right pleural effusion. Partially visualized mass within the superior segment of right lower lobe noted. Hepatobiliary: There are multifocal bilobar, ill-defined areas of mild increased T2 signal noted within both lobes of liver which show hypoenhancement on the postcontrast images and restricted diffusion. At least 5 lesions are noted involving both lobes. The largest lesion is in segment 5 measuring 1.1 cm, image 17/8. Lesion in segment 2/3 measures 6 mm, image 10/8. Status post cholecystectomy. Mild intrahepatic bile duct dilatation. Fusiform dilatation of the common bile duct which measures 1.3 cm proximally. No signs of choledocholithiasis or mass. Pancreas:  No main duct dilatation, inflammation or mass identified. Spleen: Within the proximal portion of the spleen there are 2 wedge-shaped areas of peripheral hypoenhancement, image 25/31. Adrenals/Urinary Tract: There is a 1.3 cm nodule in the left adrenal gland which is indeterminate with only mild loss of signal on the out of phase sequences, image 29/9. This is a new finding when compared with the remote CT of the abdomen pelvis from 09/17/2020. Normal right adrenal gland. No kidney mass or signs of obstructive uropathy. Stomach/Bowel: Visualized portions within the abdomen are unremarkable. Vascular/Lymphatic: No aneurysm. Aortic atherosclerosis. No adenopathy. Other:  No ascites. Musculoskeletal: Multifocal abnormal areas of enhancement are identified throughout the visualized portions of the thoracic and lumbar spine which are concerning for osseous metastasis. IMPRESSION: 1. There are multifocal bilobar, ill-defined areas of mild increased T2 signal noted within both lobes of liver which show hypoenhancement on the postcontrast images and restricted diffusion. At least 5 lesions are noted involving both lobes. 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 2.  Multifocal abnormal areas of enhancement are identified throughout the visualized portions of the thoracic and lumbar spine which are concerning for osseous metastasis. 3. There are 2 wedge-shaped areas of peripheral hypoenhancement within the proximal portion of the spleen which are concerning for splenic infarcts. 4. There is a 1.3 cm nodule in the left adrenal gland which is indeterminate with only mild loss of signal on the out of phase sequences. This is a new finding when compared with the remote CT of the abdomen pelvis from 09/17/2020. Differential considerations include lipid poor adenoma versus a adrenal metastasis. 5. Small right pleural effusion. 6. Partially visualized mass within the superior segment of right lower lobe.  7. Status post cholecystectomy. Mild intrahepatic bile duct dilatation. Fusiform dilatation of the common bile duct which measures 1.3 cm proximally. No signs of choledocholithiasis or mass. Electronically Signed   By: Kimberley Penman M.D.   On: 11/12/2023 05:07   CT ANGIO HEAD NECK W WO CM Addendum Date: 11/11/2023 ADDENDUM REPORT: 11/11/2023 23:37 ADDENDUM: Findings discussed with Brenda Morisson, NP via telephone at 10:33 p.m. Electronically Signed   By: Stevenson Elbe M.D.   On: 11/11/2023 23:37   Result Date: 11/11/2023 CLINICAL DATA:  Stroke/TIA, determine embolic source EXAM: CT ANGIOGRAPHY HEAD AND NECK WITH AND WITHOUT CONTRAST TECHNIQUE: Multidetector CT imaging of the head and neck was performed using the standard protocol during bolus administration of intravenous contrast. Multiplanar CT image reconstructions and MIPs were obtained to evaluate the vascular anatomy. Carotid stenosis measurements (when applicable) are obtained utilizing NASCET criteria, using the distal internal carotid diameter as the denominator. RADIATION DOSE REDUCTION: This exam was performed according to the departmental dose-optimization program which includes automated exposure control,  adjustment of the mA and/or kV according to patient size and/or use of iterative reconstruction technique. CONTRAST:  75mL OMNIPAQUE  IOHEXOL  350 MG/ML SOLN COMPARISON:  Same day MRI head. FINDINGS: CTA NECK FINDINGS Aortic arch: Aortic atherosclerosis. 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 Great vessel origins are patent. Right carotid system: Atherosclerosis at the carotid bifurcation without greater than 50% stenosis. Left carotid system: Atherosclerosis at the carotid bifurcation without greater than 50% stenosis. Vertebral arteries: Codominant. No evidence of dissection, stenosis (50% or greater), or occlusion. Skeleton: No evidence of acute abnormality on limited assessment. Multilevel degenerative change. Other neck: Approximately 2.3 cm left thyroid  nodule. Upper chest: Please see same day CT chest for intrathoracic findings. Emphysema. Review of the MIP images confirms the above findings CTA HEAD FINDINGS Anterior circulation: Bilateral intracranial ICAs are patent. Approximately 2 mm medially directed outpouching arising from the right cavernous ICA, which could represent an infundibulum or aneurysm. Bilateral M1 MCAs are patent. 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 Bilateral ACAs are patent without proximal hemodynamically significant stenosis. Posterior circulation: Bilateral intradural vertebral arteries, basilar artery and bilateral posterior cerebral arteries are patent without proximal hemodynamically significant stenosis. Venous sinuses: As permitted by contrast timing, patent. Review of the MIP images confirms the above findings The ordering provider has been paged at the time of dictation for call of report. IMPRESSION: 1. 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 2. Approximately 2 mm medially directed outpouching arising from the right cavernous ICA, which could represent an infundibulum or aneurysm. 3. Please  see same day CT chest for intrathoracic findings. 4. Approximately 2.3 cm left thyroid  nodule. Recommend thyroid  US  (ref: J Am Coll Radiol. 2015 Feb;12(2): 143-50). 5. Aortic Atherosclerosis (ICD10-I70.0) and Emphysema (ICD10-J43.9). Electronically Signed: By: Stevenson Elbe M.D. On: 11/11/2023 23:14   MR BRAIN W WO CONTRAST Result Date: 11/11/2023 CLINICAL DATA:  right facial weakness, malignancy also on the ddx EXAM: MRI HEAD WITHOUT AND WITH CONTRAST TECHNIQUE: Multiplanar, multiecho pulse sequences of the brain and surrounding structures were obtained without and with intravenous contrast. CONTRAST:  8mL GADAVIST  GADOBUTROL  1 MMOL/ML IV SOLN COMPARISON:  CT head November 11, 2023. 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 Mild edema without mass effect. No midline shift. Probable additional punctate acute infarct in the inferior left cerebellum. No evidence of acute hemorrhage, mass lesion, or hydrocephalus. Vascular:  Major arterial flow voids are maintained at the skull base. Skull and upper cervical spine: Normal marrow signal. Sinuses/Orbits: Clear sinuses.  No acute orbital findings. IMPRESSION: 1. 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 Probable additional punctate acute infarct in the inferior left cerebellum. These results will be called to the ordering clinician or representative by the Radiologist Assistant, and communication documented in the PACS or [employer]. Electronically Signed   By: Stevenson Elbe M.D.   On: 11/11/2023 20:27   CT CHEST W CONTRAST Result Date: 11/11/2023 CLINICAL DATA:  Liver lesions with history of breast cancer. EXAM: CT CHEST WITH CONTRAST TECHNIQUE: Multidetector CT imaging of the chest was performed during intravenous contrast administration. RADIATION DOSE REDUCTION: This exam was performed according  to the departmental dose-optimization program which includes automated exposure control, adjustment of the mA and/or kV according to patient size and/or use of iterative reconstruction technique. CONTRAST:  75mL OMNIPAQUE  IOHEXOL  300 MG/ML  SOLN COMPARISON:  CT abdomen and pelvis 11/10/2023. FINDINGS: Cardiovascular: Heart is borderline enlarged/mildly enlarged. Aorta is normal in size. There is no pericardial effusion. There are atherosclerotic calcifications of the aorta. Mediastinum/Nodes: There is a heterogeneous left thyroid  nodule attaining cystic and solid components measuring 2.6 x 1.5 cm. There are multiple enlarged right paratracheal lymph nodes measuring up to 2.0 x 2.9 cm. Enlarged subcarinal lymph node measures 1.2 cm short axis. There is a prominent right lower esophageal lymph node measuring 8 mm short axis. There are multiple enlarged right hilar lymph nodes measuring up to 1.4 x 1.8 cm. Visualized esophagus is within normal limits. Lungs/Pleura: Mild emphysematous changes are present. There is scarring in both lung apices. Multifocal airspace disease is seen throughout the right lower lobe, predominantly posteriorly and peripherally. Some areas are slightly nodular for example superior segment measuring 3.2 by 2.0 cm. A small air-fluid level seen in this region. No other air-fluid levels are seen. Additional nodular density seen in the right lower lobe image 5/92 measuring 9 mm. Smaller nodules are seen scattered throughout the right lower lobe measuring 5 mm or less. There is a trace right pleural effusion. There is a right middle lobe nodule measuring 5 mm image 5/122. There are prominent intrapulmonary lymph nodes along the right major fissure. There are 2 mm nodular densities in the left upper lobe. Left lung is otherwise clear. Upper Abdomen: Subcentimeter hypodensities in the liver and left adrenal mass are unchanged. Please see CT abdomen and pelvis performed same day for further description.  Musculoskeletal: There is anterior intramuscular chest wall edema on the right. Focal density is seen in the medial right breast extending to the skin surface measuring 1.2 x 2.3 cm image 3/81. There some associated diffuse right breast skin thickening. No acute fracture. There is a single 5 mm sclerotic density in the T3 vertebral body. IMPRESSION: 1. Multifocal airspace disease throughout the right lower lobe, predominantly posteriorly and peripherally. Single area is slightly nodular in the superior segment containing an air-fluid level worrisome for necrotic pneumonia or necrotic nodule. Findings may be infectious/inflammatory, but neoplasm not excluded. 2. Trace right pleural effusion. 3. Mediastinal and right hilar lymphadenopathy. 4. Additional right lower lobe and right middle lobe pulmonary nodules measuring 5 mm. 5. Left thyroid  nodule measuring 2.6 cm. Recommend further evaluation with ultrasound. 6. Focal density in the medial right breast extending to the skin surface measuring 1.2 x 2.3 cm. Correlate with physical exam. 7. Single 5 mm  sclerotic density in the T3 vertebral body. Aortic Atherosclerosis (ICD10-I70.0) and Emphysema (ICD10-J43.9). Electronically Signed   By: Tyron Gallon M.D.   On: 11/11/2023 17:53   CT HEAD WO CONTRAST ( ) Result Date: 11/11/2023 CLINICAL DATA:  Right facial weakness. EXAM: CT HEAD WITHOUT CONTRAST TECHNIQUE: Contiguous axial images were obtained from the base of the skull through the vertex without intravenous contrast. RADIATION DOSE REDUCTION: This exam was performed according to the departmental dose-optimization program which includes automated exposure control, adjustment of the mA and/or kV according to patient size and/or use of iterative reconstruction technique. COMPARISON:  Head CT 07/21/2022 FINDINGS: Brain: There are new small hypodensities in the left frontoparietal white matter at the level of the posterior centrum semiovale (series 3, image 20), and  there is a new subcentimeter focus of cortical hypodensity at the level of the left parietal operculum (series 6, image 46), suspicious for recent infarcts. No intracranial hemorrhage, mass, midline shift, or extra-axial fluid collection is identified. Cerebral volume is normal. The ventricles are normal in size. Vascular: No hyperdense vessel. Skull: No acute fracture or suspicious lesion. Sinuses/Orbits: Visualized paranasal sinuses and mastoid air cells are clear. Bilateral cataract extraction. Other: None. 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 A brain MRI is in progress and will provide further evaluation. Electronically Signed   By: Aundra Lee M.D.   On: 11/11/2023 15:13   DG Chest 2 View Result Date: 11/10/2023 CLINICAL DATA:  dyspnea EXAM: CHEST - 2 VIEW COMPARISON:  Chest x-ray 07/21/2022, chest x-ray 02/15/2021 FINDINGS: The heart and mediastinal contours are unchanged. Prominent hilar vasculature. No focal consolidation. No pulmonary edema. No pleural effusion. No pneumothorax. No acute osseous abnormality. IMPRESSION: Prominent hilar vasculature suggestive of pulmonary venous congestion. Underlying lymph nodes not excluded. Electronically Signed   By: Morgane  Naveau M.D.   On: 11/10/2023 23:50   CT ABDOMEN PELVIS W CONTRAST Result Date: 11/10/2023 CLINICAL DATA:  Acute nonlocalized abdominal pain. Vomiting and diarrhea. EXAM: CT ABDOMEN AND PELVIS WITH CONTRAST TECHNIQUE: Multidetector CT imaging of the abdomen and pelvis was performed using the standard protocol following bolus administration of intravenous contrast. RADIATION DOSE REDUCTION: This exam was performed according to the departmental dose-optimization program which includes automated exposure control, adjustment of the mA and/or kV according to patient size and/or use of iterative reconstruction technique. CONTRAST:  OMNIPAQUE  IOHEXOL  300 MG/ML  SOLN COMPARISON:  None  Available. FINDINGS: Lower chest: Focal consolidation suggested in the right lung base with small right pleural effusion. This may represent pneumonia or less likely aspiration. Emphysematous changes in the lungs. Hepatobiliary: Multiple scattered low-attenuation lesions in the liver, most are subcentimeter in size. Largest is in the medial segment left lobe measuring 1.3 cm in diameter. Enhancement pattern is indeterminate. Consider follow-up MRI for further characterization. The gallbladder is not visualized, likely surgically absent. Mild intrahepatic bile duct dilatation is most likely due to postoperative change. No common duct stones are identified. Pancreas: Unremarkable. No pancreatic ductal dilatation or surrounding inflammatory changes. Spleen: Segmental low-attenuation in the upper spleen likely represents a splenic infarct. Spleen size is normal. Adrenals/Urinary Tract: 1.3 cm diameter left adrenal gland nodule with density measurement of 69 Hounsfield units. Renal nephrograms are symmetrical. No solid mass identified. No hydronephrosis or hydroureter. Bladder is normal. Stomach/Bowel: Stomach is within normal limits. Appendix appears normal. No evidence of bowel wall thickening, distention, or inflammatory changes. Vascular/Lymphatic: Aortic atherosclerosis. No enlarged abdominal or pelvic lymph nodes. Reproductive: Uterus and bilateral adnexa  are unremarkable. Other: No abdominal wall hernia or abnormality. No abdominopelvic ascites. Musculoskeletal: Postoperative fixation of the right hip. No acute bony abnormalities. IMPRESSION: 1. Nonspecific low-attenuation lesions in the liver, largest measuring 1.3 cm diameter. Enhancement pattern is indeterminate. Consider follow-up MRI for further characterization. 2. Mild intrahepatic bile duct dilatation is likely postoperative in the setting of cholecystectomy. 3. Left adrenal mass measuring 1.3 cm, probable benign adenoma. Recommend follow-up adrenal  washout CT in 1 year. If stable for = 1 year, no further follow-up imaging. JACR 2017 Aug; 14(8):1038-44, JCAT 2016 Mar-Apr; 40(2):194-200, Urol J 2006 Spring; 3(2):71-4. 4. Aortic atherosclerosis. 5. Focal area of consolidation suggested in the right lung base with small right pleural effusion, likely pneumonia. Electronically Signed   By: Boyce Byes M.D.   On: 11/10/2023 23:30

## 2024-01-14 ENCOUNTER — Telehealth: Payer: Self-pay

## 2024-01-14 ENCOUNTER — Inpatient Hospital Stay

## 2024-01-14 ENCOUNTER — Ambulatory Visit
Admission: RE | Admit: 2024-01-14 | Discharge: 2024-01-14 | Disposition: A | Discharge status: Home / Self Care | Source: Ambulatory Visit | Attending: Radiation Oncology | Admitting: Radiation Oncology

## 2024-01-14 ENCOUNTER — Encounter: Payer: Self-pay | Admitting: Radiation Oncology

## 2024-01-14 VITALS — BP 125/83 | HR 91 | Temp 97.9°F | Resp 16

## 2024-01-14 DIAGNOSIS — Z170 Estrogen receptor positive status [ER+]: Secondary | ICD-10-CM | POA: Insufficient documentation

## 2024-01-14 DIAGNOSIS — C7951 Secondary malignant neoplasm of bone: Secondary | ICD-10-CM | POA: Insufficient documentation

## 2024-01-14 DIAGNOSIS — C50211 Malignant neoplasm of upper-inner quadrant of right female breast: Secondary | ICD-10-CM | POA: Insufficient documentation

## 2024-01-14 DIAGNOSIS — C787 Secondary malignant neoplasm of liver and intrahepatic bile duct: Secondary | ICD-10-CM | POA: Diagnosis not present

## 2024-01-14 DIAGNOSIS — C50919 Malignant neoplasm of unspecified site of unspecified female breast: Secondary | ICD-10-CM

## 2024-01-14 NOTE — Telephone Encounter (Signed)
 BM bx has been r/s to 6/16 @ 9:30a, arrive 8:30. Pt aware of appt details.

## 2024-01-14 NOTE — Progress Notes (Signed)
 Radiation Oncology Follow up Note  Name: Debra [name]   Date:   01/14/2024 MRN:  [id number] DOB: [birth year]    This 68 y.o. female presents to the clinic today for assessment of palliative radiation therapy to her lumbar spine and patient with stage IV triple positive breast cancer.  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  She received whole breast radiation.  She is gone on develop widespread metastatic disease.  By PET/CT criteria.  She has mediastinal right hilar nodes extensive osseous metastatic disease multiple liver metastasis and hypermetabolic left adrenal nodule she is complaining of significant lower back pain.  She is currently on narcotic analgesics.  COMPLICATIONS OF TREATMENT: none  FOLLOW UP COMPLIANCE: keeps appointments   PHYSICAL EXAM:  BP 125/83   Pulse 91   Temp 97.9 F (36.6 C) (Tympanic)   Resp 16  Wheelchair-bound female in NAD on nasal oxygen .  She has some decreased strength in the lower extremities no focal neurologic deficits.  Motor and sensory levels are equal and symmetric.  Well-developed well-nourished patient in NAD. HEENT reveals PERLA, EOMI, discs not visualized.  Oral cavity is clear. No oral mucosal lesions are identified. Neck is clear without evidence of cervical or supraclavicular adenopathy. Lungs are clear to A&P. Cardiac examination is essentially unremarkable with regular rate and rhythm without murmur rub or thrill. Abdomen is benign with no organomegaly or masses noted. Motor sensory and DTR levels are equal and symmetric in the upper and lower extremities. Cranial nerves II through XII are grossly intact. Proprioception is intact. No peripheral adenopathy or edema is identified. No motor or sensory  levels are noted. Crude visual fields are within normal range.  RADIOLOGY RESULTS: PET CT scan is reviewed compatible with above-stated findings  PLAN: At this time elect go ahead with palliative radiation therapy to her lumbar spine.  Will treat L1-L4 and use PET fusion study for treatment planning purposes.  Would plan on delivering 30 Gray in 10 fractions.  Risks and benefits of treatment clinic possible diarrhea skin reaction fatigue alteration blood counts all were explained to the patient.  I have personally set up and ordered CT simulation for early next week and hopefully have her under treatment as soon as possible.  Patient comprehends my recommendations well.  I would like to take this opportunity to thank you for allowing me to participate in the care of your patient.Glenis Langdon, MD

## 2024-01-18 ENCOUNTER — Ambulatory Visit
Admission: RE | Admit: 2024-01-18 | Discharge: 2024-01-18 | Disposition: A | Discharge status: Home / Self Care | Source: Ambulatory Visit | Attending: Radiation Oncology | Admitting: Radiation Oncology

## 2024-01-18 DIAGNOSIS — C7951 Secondary malignant neoplasm of bone: Secondary | ICD-10-CM | POA: Insufficient documentation

## 2024-01-18 DIAGNOSIS — Z170 Estrogen receptor positive status [ER+]: Secondary | ICD-10-CM | POA: Diagnosis not present

## 2024-01-18 DIAGNOSIS — C50211 Malignant neoplasm of upper-inner quadrant of right female breast: Secondary | ICD-10-CM | POA: Insufficient documentation

## 2024-01-18 DIAGNOSIS — C787 Secondary malignant neoplasm of liver and intrahepatic bile duct: Secondary | ICD-10-CM | POA: Diagnosis not present

## 2024-01-19 ENCOUNTER — Other Ambulatory Visit

## 2024-01-19 ENCOUNTER — Ambulatory Visit

## 2024-01-19 ENCOUNTER — Encounter: Payer: Self-pay | Admitting: Oncology

## 2024-01-19 ENCOUNTER — Ambulatory Visit: Admitting: Oncology

## 2024-01-19 ENCOUNTER — Other Ambulatory Visit: Payer: Self-pay | Admitting: *Deleted

## 2024-01-19 DIAGNOSIS — C7951 Secondary malignant neoplasm of bone: Secondary | ICD-10-CM | POA: Diagnosis not present

## 2024-01-19 MED ORDER — OXYCODONE HCL 10 MG PO TABS: 5.0000 mg | ORAL_TABLET | ORAL | 0 refills | Status: DC | PRN

## 2024-01-19 NOTE — Telephone Encounter (Signed)
 The son called over today and said that his mom's oxycodone  will be out before they can come in tomorrow.  He says that she has been in a lot of pain and Josh said if it got real bad she could have extra.  I told the son that I am going to send a message to [facility] and see what he will do .

## 2024-01-19 NOTE — Telephone Encounter (Signed)
 Testing completed. Results given to provider.

## 2024-01-20 ENCOUNTER — Inpatient Hospital Stay: Admitting: Oncology

## 2024-01-20 ENCOUNTER — Ambulatory Visit: Admitting: Oncology

## 2024-01-20 ENCOUNTER — Inpatient Hospital Stay: Admitting: Hospice and Palliative Medicine

## 2024-01-20 ENCOUNTER — Inpatient Hospital Stay

## 2024-01-20 ENCOUNTER — Telehealth: Payer: Self-pay | Admitting: Oncology

## 2024-01-20 ENCOUNTER — Inpatient Hospital Stay: Attending: Hospice and Palliative Medicine

## 2024-01-20 ENCOUNTER — Ambulatory Visit

## 2024-01-20 NOTE — Telephone Encounter (Signed)
 pt spouse called and stated pt not feeling well and will not be at appts today (6/4). Appts canceled

## 2024-01-21 ENCOUNTER — Encounter: Payer: Self-pay | Admitting: Oncology

## 2024-01-22 ENCOUNTER — Other Ambulatory Visit: Payer: Self-pay | Admitting: Oncology

## 2024-01-22 DIAGNOSIS — C3490 Malignant neoplasm of unspecified part of unspecified bronchus or lung: Secondary | ICD-10-CM | POA: Insufficient documentation

## 2024-01-22 DIAGNOSIS — C50919 Malignant neoplasm of unspecified site of unspecified female breast: Secondary | ICD-10-CM

## 2024-01-22 NOTE — Progress Notes (Signed)
 START OFF PATHWAY REGIMEN - Non-Small Cell Lung   [id number]  AUC=1.5 IV D1,8,15 + Paclitaxel  80 mg/m2 IV D1,8,15 + Pembrolizumab 200 mg IV D1 + G-CSF q21 Days x 12 Weeks [facility] + Pembrolizumab 200 mg IV D1 + G-CSF q21 Days x 12 Weeks:   Cycles 1 through 4: A cycle is every 21 days:     Pembrolizumab      Paclitaxel       Carboplatin       Filgrastim-xxxx    Cycles 5 through 8: A cycle is every 21 days:     Pembrolizumab      Doxorubicin      Cyclophosphamide      Pegfilgrastim -xxxx   **Always confirm dose/schedule in your pharmacy ordering system**  Patient Characteristics: Stage IV Metastatic, Nonsquamous, Molecular Analysis Completed, Molecular Alteration Present and Targeted Therapy Exhausted OR KRAS G12C+ or HER2+ or NRG1+ Present and No Prior Chemo/Immunotherapy OR No Alteration Present, Initial  Chemotherapy/Immunotherapy, PS = 2, No Alteration Present, No Alteration Present, PD-L1 Expression Positive 1-49% (TPS) / Negative / Not Tested / Awaiting Test Results Therapeutic Status: Stage IV Metastatic Histology: Nonsquamous Cell Broad Molecular Profiling Status: Molecular Analysis Completed Molecular Analysis Results: No Alteration Present ECOG Performance Status: 2 Chemotherapy/Immunotherapy Line of Therapy: Initial Chemotherapy/Immunotherapy EGFR Exons 18-21 Mutation Testing Status: Completed and Negative ALK Fusion/Rearrangement Testing Status: Completed and Negative BRAF V600 Mutation Testing Status: Completed and Negative KRAS G12C Mutation Testing Status: Completed and Negative MET Exon 14 Mutation Testing Status: Completed and Negative RET Fusion/Rearrangement Testing Status: Completed and Negative NRG1 Fusion/Rearrangement Testing Status: Completed and Negative HER2 Mutation Testing Status: Completed and Negative NTRK Fusion/Rearrangement Testing Status: Completed and Negative ROS1 Fusion/Rearrangement Testing Status: Completed and Negative PD-L1  Expression Status: PD-L1 Positive 1-49% (TPS) Intent of Therapy: Non-Curative / Palliative Intent, Discussed with Patient

## 2024-01-25 ENCOUNTER — Ambulatory Visit

## 2024-01-25 ENCOUNTER — Other Ambulatory Visit: Payer: Self-pay | Admitting: *Deleted

## 2024-01-25 ENCOUNTER — Encounter: Payer: Self-pay | Admitting: Oncology

## 2024-01-25 ENCOUNTER — Telehealth: Payer: Self-pay | Admitting: *Deleted

## 2024-01-25 MED ORDER — MORPHINE SULFATE ER 15 MG PO TBCR
15.0000 mg | EXTENDED_RELEASE_TABLET | Freq: Two times a day (BID) | ORAL | 0 refills | Status: AC
Start: 2024-01-25 — End: ?

## 2024-01-25 NOTE — Telephone Encounter (Signed)
 Husband called to say he needs a refill on the MS Contin  and feels like maybe it should give her more overtime instead of every 12 hours may be 8 or something like that.  Says the last 1 will be tonight so we have to refill it.  Patient is coming in tomorrow so they can talk about what they need to do with the pain medicine.  And Bambi Lever says that is fine talking about it in the morning to, I will send the mscontin to [facility]

## 2024-01-26 ENCOUNTER — Ambulatory Visit

## 2024-01-26 ENCOUNTER — Inpatient Hospital Stay: Admitting: Hospice and Palliative Medicine

## 2024-01-26 ENCOUNTER — Inpatient Hospital Stay

## 2024-01-26 ENCOUNTER — Inpatient Hospital Stay: Admitting: Oncology

## 2024-01-26 ENCOUNTER — Inpatient Hospital Stay: Attending: Hospice and Palliative Medicine | Admitting: Oncology

## 2024-01-26 ENCOUNTER — Other Ambulatory Visit

## 2024-01-26 ENCOUNTER — Ambulatory Visit: Admitting: Oncology

## 2024-01-26 ENCOUNTER — Encounter: Payer: Self-pay | Admitting: Oncology

## 2024-01-26 DIAGNOSIS — C50919 Malignant neoplasm of unspecified site of unspecified female breast: Secondary | ICD-10-CM | POA: Diagnosis not present

## 2024-01-26 DIAGNOSIS — C3490 Malignant neoplasm of unspecified part of unspecified bronchus or lung: Secondary | ICD-10-CM | POA: Diagnosis not present

## 2024-01-26 DIAGNOSIS — I6310 Cerebral infarction due to embolism of unspecified precerebral artery: Secondary | ICD-10-CM

## 2024-01-26 DIAGNOSIS — R112 Nausea with vomiting, unspecified: Secondary | ICD-10-CM

## 2024-01-26 MED ORDER — PROMETHAZINE HCL 25 MG PO TABS: 25.0000 mg | ORAL_TABLET | Freq: Four times a day (QID) | ORAL | 1 refills | Status: DC | PRN

## 2024-01-26 NOTE — Assessment & Plan Note (Signed)
 Continue Lovenox  1 mg/kg subcutaneous twice daily. Continue statin

## 2024-01-26 NOTE — Assessment & Plan Note (Addendum)
 Tissue origin suggests 50% chance of lung adenocarcinoma.  Patient NGS showed KRAS G12A which is much more common in lung cancer than breast cancer.  TPS 5%. PET scan tumor burden distribution also suggest primary lung cancer with metastasis. Discussed lengthy with patient and her husband. Currently she is on palliative radiation to back. I recommend weekly carboplatin  and Taxol  for treatment of possible stage IV lung cancer.  Once she finishes radiation, consider adding pembrolizumab. This regimen will also cover triple negative breast cancer treatments.  She agrees with the plan.

## 2024-01-26 NOTE — Assessment & Plan Note (Signed)
 She has tried ondansetron  disintegrating tablets 8 mg every 8 hours as needed-this has not helped her recently. Recommend patient to try Phenergan  25 mg every 6 hours as needed.
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
## 2024-01-26 NOTE — Assessment & Plan Note (Addendum)
 Right breast cT2 cN0 invasive carcinoma, ER/PR positive, HER2 positive -->neoadjuvant chemo S/p TCH x2, and TCHP x4 followed by lumpectomy and sentinel lymph node biopsy. ypT1a ypN0- Residual disease after neoadjuvant chemotherapy, status post adjuvant radiation S/p 1 year of Kadcyla  --> 10/2023 CT/MRI showed recurrence --> biopsy showed metastatic adenocarcinoma, breast origin. Triple negative.  Images were reviewed and discussed with patient.  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  PET scan was reviewed.  Patient has hypermetabolic active right lower lobe mass with extensive intrathoracic lymph node, liver, bone, adrenal involvement -raising suspicion of primary lung cancer tissue origin.  -Check NGS and tissue of origin. Overall she tolerated weekly Taxol  with mild difficulties.  NGS showed KRAS G12A, STK11, DMNT3A, CDKN2A, CKDN2B KEAP1, TMB 5.3, pMMR , PD-L1, TPS 5% tumor of origin suggest lung adenocarcinoma  Previously discussed with pathology, pulmonary adenocarcinoma is not completely ruled out.  TTF staining may be misleading due to small tissue quality.  Ideally additional tissue biopsy may help clarify tissue of origin. However due to patient's recurrent embolic stroke, it is of high risk to stop anticoagulation for additional biopsy.

## 2024-01-26 NOTE — Progress Notes (Signed)
 Patient contacted for Mychart visit. Pt reports that she had upset stomach and was vomiting this morning that's why she did not come to appt this morning. She reports that she has been feeling nauseated these past few days, especially in the mornings. Pt also reports weakness.

## 2024-01-27 ENCOUNTER — Other Ambulatory Visit: Payer: Self-pay

## 2024-01-27 ENCOUNTER — Encounter: Payer: Self-pay | Admitting: Emergency Medicine

## 2024-01-27 ENCOUNTER — Ambulatory Visit

## 2024-01-27 ENCOUNTER — Emergency Department

## 2024-01-27 ENCOUNTER — Inpatient Hospital Stay
Admission: EM | Admit: 2024-01-27 | Discharge: 2024-01-29 | DRG: 194 | Disposition: A | Discharge status: Home / Self Care | Attending: Family Medicine | Admitting: Family Medicine

## 2024-01-27 ENCOUNTER — Encounter: Payer: Self-pay | Admitting: Oncology

## 2024-01-27 DIAGNOSIS — C7951 Secondary malignant neoplasm of bone: Secondary | ICD-10-CM | POA: Diagnosis present

## 2024-01-27 DIAGNOSIS — G893 Neoplasm related pain (acute) (chronic): Secondary | ICD-10-CM | POA: Diagnosis present

## 2024-01-27 DIAGNOSIS — I480 Paroxysmal atrial fibrillation: Secondary | ICD-10-CM | POA: Diagnosis present

## 2024-01-27 DIAGNOSIS — I10 Essential (primary) hypertension: Secondary | ICD-10-CM | POA: Diagnosis present

## 2024-01-27 DIAGNOSIS — C50919 Malignant neoplasm of unspecified site of unspecified female breast: Secondary | ICD-10-CM | POA: Diagnosis not present

## 2024-01-27 DIAGNOSIS — K219 Gastro-esophageal reflux disease without esophagitis: Secondary | ICD-10-CM | POA: Diagnosis present

## 2024-01-27 DIAGNOSIS — E785 Hyperlipidemia, unspecified: Secondary | ICD-10-CM | POA: Diagnosis present

## 2024-01-27 DIAGNOSIS — Z808 Family history of malignant neoplasm of other organs or systems: Secondary | ICD-10-CM

## 2024-01-27 DIAGNOSIS — R531 Weakness: Secondary | ICD-10-CM | POA: Diagnosis present

## 2024-01-27 DIAGNOSIS — G4700 Insomnia, unspecified: Secondary | ICD-10-CM | POA: Diagnosis present

## 2024-01-27 DIAGNOSIS — Z1721 Progesterone receptor positive status: Secondary | ICD-10-CM

## 2024-01-27 DIAGNOSIS — Z853 Personal history of malignant neoplasm of breast: Secondary | ICD-10-CM

## 2024-01-27 DIAGNOSIS — J440 Chronic obstructive pulmonary disease with acute lower respiratory infection: Secondary | ICD-10-CM | POA: Diagnosis present

## 2024-01-27 DIAGNOSIS — Z87891 Personal history of nicotine dependence: Secondary | ICD-10-CM

## 2024-01-27 DIAGNOSIS — Z7901 Long term (current) use of anticoagulants: Secondary | ICD-10-CM

## 2024-01-27 DIAGNOSIS — Z8249 Family history of ischemic heart disease and other diseases of the circulatory system: Secondary | ICD-10-CM

## 2024-01-27 DIAGNOSIS — R54 Age-related physical debility: Secondary | ICD-10-CM | POA: Diagnosis present

## 2024-01-27 DIAGNOSIS — Z515 Encounter for palliative care: Secondary | ICD-10-CM | POA: Diagnosis not present

## 2024-01-27 DIAGNOSIS — D649 Anemia, unspecified: Secondary | ICD-10-CM | POA: Diagnosis present

## 2024-01-27 DIAGNOSIS — C3490 Malignant neoplasm of unspecified part of unspecified bronchus or lung: Secondary | ICD-10-CM | POA: Diagnosis present

## 2024-01-27 DIAGNOSIS — Z7983 Long term (current) use of bisphosphonates: Secondary | ICD-10-CM

## 2024-01-27 DIAGNOSIS — J189 Pneumonia, unspecified organism: Principal | ICD-10-CM | POA: Diagnosis present

## 2024-01-27 DIAGNOSIS — Z801 Family history of malignant neoplasm of trachea, bronchus and lung: Secondary | ICD-10-CM

## 2024-01-27 DIAGNOSIS — I639 Cerebral infarction, unspecified: Secondary | ICD-10-CM | POA: Diagnosis not present

## 2024-01-27 DIAGNOSIS — Z8051 Family history of malignant neoplasm of kidney: Secondary | ICD-10-CM

## 2024-01-27 DIAGNOSIS — Z6830 Body mass index (BMI) 30.0-30.9, adult: Secondary | ICD-10-CM

## 2024-01-27 DIAGNOSIS — Z8673 Personal history of transient ischemic attack (TIA), and cerebral infarction without residual deficits: Secondary | ICD-10-CM | POA: Diagnosis not present

## 2024-01-27 DIAGNOSIS — Z7951 Long term (current) use of inhaled steroids: Secondary | ICD-10-CM

## 2024-01-27 DIAGNOSIS — Z9981 Dependence on supplemental oxygen: Secondary | ICD-10-CM | POA: Diagnosis not present

## 2024-01-27 DIAGNOSIS — J4489 Other specified chronic obstructive pulmonary disease: Secondary | ICD-10-CM | POA: Diagnosis present

## 2024-01-27 DIAGNOSIS — E669 Obesity, unspecified: Secondary | ICD-10-CM | POA: Diagnosis present

## 2024-01-27 DIAGNOSIS — M549 Dorsalgia, unspecified: Secondary | ICD-10-CM | POA: Diagnosis present

## 2024-01-27 DIAGNOSIS — C787 Secondary malignant neoplasm of liver and intrahepatic bile duct: Secondary | ICD-10-CM | POA: Diagnosis present

## 2024-01-27 DIAGNOSIS — Z825 Family history of asthma and other chronic lower respiratory diseases: Secondary | ICD-10-CM

## 2024-01-27 DIAGNOSIS — D638 Anemia in other chronic diseases classified elsewhere: Secondary | ICD-10-CM | POA: Diagnosis present

## 2024-01-27 DIAGNOSIS — Z803 Family history of malignant neoplasm of breast: Secondary | ICD-10-CM

## 2024-01-27 DIAGNOSIS — Z170 Estrogen receptor positive status [ER+]: Secondary | ICD-10-CM

## 2024-01-27 DIAGNOSIS — R110 Nausea: Secondary | ICD-10-CM | POA: Diagnosis present

## 2024-01-27 DIAGNOSIS — D630 Anemia in neoplastic disease: Secondary | ICD-10-CM | POA: Diagnosis present

## 2024-01-27 DIAGNOSIS — Z95828 Presence of other vascular implants and grafts: Secondary | ICD-10-CM

## 2024-01-27 DIAGNOSIS — Z66 Do not resuscitate: Secondary | ICD-10-CM | POA: Diagnosis present

## 2024-01-27 DIAGNOSIS — Z79899 Other long term (current) drug therapy: Secondary | ICD-10-CM

## 2024-01-27 DIAGNOSIS — C7800 Secondary malignant neoplasm of unspecified lung: Secondary | ICD-10-CM | POA: Diagnosis present

## 2024-01-27 DIAGNOSIS — Z9049 Acquired absence of other specified parts of digestive tract: Secondary | ICD-10-CM

## 2024-01-27 DIAGNOSIS — Z818 Family history of other mental and behavioral disorders: Secondary | ICD-10-CM

## 2024-01-27 DIAGNOSIS — Z823 Family history of stroke: Secondary | ICD-10-CM

## 2024-01-27 LAB — URINALYSIS, W/ REFLEX TO CULTURE (INFECTION SUSPECTED)
Bilirubin Urine: NEGATIVE
Glucose, UA: NEGATIVE mg/dL
Hgb urine dipstick: NEGATIVE
Ketones, ur: NEGATIVE mg/dL
Nitrite: POSITIVE — AB
Protein, ur: NEGATIVE mg/dL
Specific Gravity, Urine: 1.012 (ref 1.005–1.030)
WBC, UA: >50 WBC/hpf (ref 0–5)
pH: 7 (ref 5.0–8.0)

## 2024-01-27 LAB — CBC WITH DIFFERENTIAL/PLATELET
Abs Immature Granulocytes: 0.15 10*3/uL — ABNORMAL HIGH (ref 0.00–0.07)
Basophils Absolute: 0.1 10*3/uL (ref 0.0–0.1)
Basophils Relative: 1 %
Eosinophils Absolute: 0.1 10*3/uL (ref 0.0–0.5)
Eosinophils Relative: 1 %
HCT: 29.4 % — ABNORMAL LOW (ref 36.0–46.0)
Hemoglobin: 9.3 g/dL — ABNORMAL LOW (ref 12.0–15.0)
Immature Granulocytes: 2 %
Lymphocytes Relative: 15 %
Lymphs Abs: 1.5 10*3/uL (ref 0.7–4.0)
MCH: 30.9 pg (ref 26.0–34.0)
MCHC: 31.6 g/dL (ref 30.0–36.0)
MCV: 97.7 fL (ref 80.0–100.0)
Monocytes Absolute: 0.8 10*3/uL (ref 0.1–1.0)
Monocytes Relative: 8 %
Neutro Abs: 7.4 10*3/uL (ref 1.7–7.7)
Neutrophils Relative %: 73 %
Platelets: 299 10*3/uL (ref 150–400)
RBC: 3.01 MIL/uL — ABNORMAL LOW (ref 3.87–5.11)
RDW: 17.2 % — ABNORMAL HIGH (ref 11.5–15.5)
WBC: 10 10*3/uL (ref 4.0–10.5)
nRBC: 0 % (ref 0.0–0.2)

## 2024-01-27 LAB — COMPREHENSIVE METABOLIC PANEL WITH GFR
ALT: 16 U/L (ref 0–44)
AST: 17 U/L (ref 15–41)
Albumin: 2.4 g/dL — ABNORMAL LOW (ref 3.5–5.0)
Alkaline Phosphatase: 142 U/L — ABNORMAL HIGH (ref 38–126)
Anion gap: 7 (ref 5–15)
BUN: 12 mg/dL (ref 8–23)
CO2: 30 mmol/L (ref 22–32)
Calcium: 8.7 mg/dL — ABNORMAL LOW (ref 8.9–10.3)
Chloride: 99 mmol/L (ref 98–111)
Creatinine, Ser: 0.74 mg/dL (ref 0.44–1.00)
GFR, Estimated: >60 mL/min (ref >60)
Glucose, Bld: 101 mg/dL — ABNORMAL HIGH (ref 70–99)
Potassium: 3.8 mmol/L (ref 3.5–5.1)
Sodium: 136 mmol/L (ref 135–145)
Total Bilirubin: 0.7 mg/dL (ref 0.0–1.2)
Total Protein: 6 g/dL — ABNORMAL LOW (ref 6.5–8.1)

## 2024-01-27 LAB — MRSA NEXT GEN BY PCR, NASAL: MRSA by PCR Next Gen: NOT DETECTED

## 2024-01-27 LAB — PROTIME-INR
INR: 1.2 (ref 0.8–1.2)
Prothrombin Time: 15 s (ref 11.4–15.2)

## 2024-01-27 LAB — LACTIC ACID, PLASMA
Lactic Acid, Venous: 1 mmol/L (ref 0.5–1.9)
Lactic Acid, Venous: 1 mmol/L (ref 0.5–1.9)

## 2024-01-27 MED ORDER — HEPARIN SODIUM (PORCINE) 5000 UNIT/ML IJ SOLN
5000.0000 [IU] | Freq: Three times a day (TID) | INTRAMUSCULAR | Status: DC
  Administered 2024-01-27 – 2024-01-28 (×2): 5000 [IU] via SUBCUTANEOUS
  Filled 2024-01-27 (×2): qty 1

## 2024-01-27 MED ORDER — CITALOPRAM HYDROBROMIDE 20 MG PO TABS
10.0000 mg | ORAL_TABLET | Freq: Every day | ORAL | Status: DC
  Administered 2024-01-27 – 2024-01-29 (×3): 10 mg via ORAL
  Filled 2024-01-27 (×3): qty 1

## 2024-01-27 MED ORDER — ENSURE ENLIVE PO LIQD
237.0000 mL | Freq: Three times a day (TID) | ORAL | Status: DC
  Administered 2024-01-27 – 2024-01-28 (×2): 237 mL via ORAL
  Filled 2024-01-27: qty 237

## 2024-01-27 MED ORDER — ONDANSETRON HCL 4 MG/2ML IJ SOLN
4.0000 mg | Freq: Once | INTRAMUSCULAR | Status: AC
  Administered 2024-01-27: 4 mg via INTRAVENOUS
  Filled 2024-01-27: qty 2

## 2024-01-27 MED ORDER — OXYCODONE HCL 5 MG PO TABS
5.0000 mg | ORAL_TABLET | ORAL | Status: DC | PRN
  Administered 2024-01-27 – 2024-01-29 (×4): 10 mg via ORAL
  Filled 2024-01-27 (×4): qty 2

## 2024-01-27 MED ORDER — MELATONIN 5 MG PO TABS
15.0000 mg | ORAL_TABLET | Freq: Every day | ORAL | Status: DC
  Administered 2024-01-27 – 2024-01-28 (×2): 15 mg via ORAL
  Filled 2024-01-27 (×2): qty 3

## 2024-01-27 MED ORDER — ACETAMINOPHEN 650 MG RE SUPP
650.0000 mg | Freq: Four times a day (QID) | RECTAL | Status: AC | PRN
Start: 2024-01-27 — End: 2024-02-01

## 2024-01-27 MED ORDER — SODIUM CHLORIDE 0.9 % IV SOLN
500.0000 mg | INTRAVENOUS | Status: DC
  Administered 2024-01-28: 500 mg via INTRAVENOUS
  Filled 2024-01-27 (×2): qty 5

## 2024-01-27 MED ORDER — ACETAMINOPHEN 325 MG PO TABS: 650.0000 mg | ORAL_TABLET | Freq: Four times a day (QID) | ORAL | Status: DC | PRN

## 2024-01-27 MED ORDER — IPRATROPIUM-ALBUTEROL 0.5-2.5 (3) MG/3ML IN SOLN: 3.0000 mL | Freq: Four times a day (QID) | RESPIRATORY_TRACT | Status: DC | PRN

## 2024-01-27 MED ORDER — MORPHINE SULFATE (PF) 4 MG/ML IV SOLN
4.0000 mg | Freq: Once | INTRAVENOUS | Status: AC
  Administered 2024-01-27: 4 mg via INTRAVENOUS
  Filled 2024-01-27: qty 1

## 2024-01-27 MED ORDER — PREGABALIN 75 MG PO CAPS
150.0000 mg | ORAL_CAPSULE | Freq: Two times a day (BID) | ORAL | Status: DC
  Administered 2024-01-27 – 2024-01-29 (×4): 150 mg via ORAL
  Filled 2024-01-27 (×4): qty 2

## 2024-01-27 MED ORDER — ONDANSETRON HCL 4 MG/2ML IJ SOLN
4.0000 mg | Freq: Four times a day (QID) | INTRAMUSCULAR | Status: DC | PRN
  Administered 2024-01-28: 4 mg via INTRAVENOUS
  Filled 2024-01-27: qty 2

## 2024-01-27 MED ORDER — ONDANSETRON HCL 4 MG PO TABS
4.0000 mg | ORAL_TABLET | Freq: Four times a day (QID) | ORAL | Status: AC | PRN
Start: 2024-01-27 — End: 2024-02-01

## 2024-01-27 MED ORDER — SODIUM CHLORIDE 0.9 % IV SOLN
2.0000 g | INTRAVENOUS | Status: DC
  Administered 2024-01-27 – 2024-01-28 (×2): 2 g via INTRAVENOUS
  Filled 2024-01-27 (×3): qty 20

## 2024-01-27 MED ORDER — ROFLUMILAST 500 MCG PO TABS
500.0000 ug | ORAL_TABLET | Freq: Every day | ORAL | Status: DC
  Administered 2024-01-28 – 2024-01-29 (×2): 500 ug via ORAL
  Filled 2024-01-27 (×2): qty 1

## 2024-01-27 MED ORDER — DILTIAZEM HCL ER COATED BEADS 120 MG PO CP24
120.0000 mg | ORAL_CAPSULE | Freq: Every day | ORAL | Status: DC
  Administered 2024-01-28: 120 mg via ORAL
  Filled 2024-01-27 (×2): qty 1

## 2024-01-27 MED ORDER — BUDESON-GLYCOPYRROL-FORMOTEROL 160-9-4.8 MCG/ACT IN AERO
2.0000 | INHALATION_SPRAY | Freq: Two times a day (BID) | RESPIRATORY_TRACT | Status: DC
  Administered 2024-01-27 – 2024-01-29 (×4): 2 via RESPIRATORY_TRACT
  Filled 2024-01-27: qty 5.9

## 2024-01-27 MED ORDER — METOPROLOL TARTRATE 5 MG/5ML IV SOLN
5.0000 mg | INTRAVENOUS | Status: DC | PRN
  Administered 2024-01-28: 5 mg via INTRAVENOUS
  Filled 2024-01-27: qty 5

## 2024-01-27 MED ORDER — MAGNESIUM CHLORIDE 64 MG PO TBEC
1.0000 | DELAYED_RELEASE_TABLET | Freq: Every day | ORAL | Status: DC
  Administered 2024-01-28 – 2024-01-29 (×2): 64 mg via ORAL
  Filled 2024-01-27 (×2): qty 1

## 2024-01-27 MED ORDER — LEVOFLOXACIN IN D5W 750 MG/150ML IV SOLN
750.0000 mg | Freq: Once | INTRAVENOUS | Status: AC
Start: 2024-01-27 — End: 2024-01-27
  Administered 2024-01-27: 750 mg via INTRAVENOUS
  Filled 2024-01-27: qty 150

## 2024-01-27 MED ORDER — DEXAMETHASONE 0.5 MG PO TABS
1.0000 mg | ORAL_TABLET | Freq: Every day | ORAL | Status: DC
  Administered 2024-01-28 – 2024-01-29 (×2): 1 mg via ORAL
  Filled 2024-01-27 (×2): qty 2

## 2024-01-27 MED ORDER — MORPHINE SULFATE ER 15 MG PO TBCR
15.0000 mg | EXTENDED_RELEASE_TABLET | Freq: Two times a day (BID) | ORAL | Status: DC
  Administered 2024-01-27 – 2024-01-29 (×4): 15 mg via ORAL
  Filled 2024-01-27 (×4): qty 1

## 2024-01-27 MED ORDER — ATORVASTATIN CALCIUM 20 MG PO TABS
80.0000 mg | ORAL_TABLET | Freq: Every day | ORAL | Status: DC
  Administered 2024-01-27 – 2024-01-28 (×2): 80 mg via ORAL
  Filled 2024-01-27 (×2): qty 4
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
## 2024-01-27 NOTE — Assessment & Plan Note (Signed)
 PDMP reviewed Patient currently has active prescription for pregabalin  150 mg capsule, 30-day supply, 60 quantity, that was filled on 01/16/2024, morphine  sulfate 15 mg tablet, 30-day supply, 60 tablet filled on 12/31/23, oxycodone  10 mg tablet, 15-day supply, 90 quantity written and filled on 01/22/2024

## 2024-01-27 NOTE — Progress Notes (Signed)
   01/27/24 1609  Assess: MEWS Score  Temp 98.4 F (36.9 C)  BP (!) 100/55  MAP (mmHg) 67  Pulse Rate (!) 103  Resp 18  SpO2 96 %  O2 Device Nasal Cannula  O2 Flow Rate (L/min) 2 L/min  Assess: MEWS Score  MEWS Temp 0  MEWS Systolic 1  MEWS Pulse 1  MEWS RR 0  MEWS LOC 0  MEWS Score 2  MEWS Score Color Yellow  Assess: if the MEWS score is Yellow or Red  Were vital signs accurate and taken at a resting state? Yes  Does the patient meet 2 or more of the SIRS criteria? Yes  Does the patient have a confirmed or suspected source of infection? Yes  MEWS guidelines implemented  Yes, yellow  Treat  MEWS Interventions Considered administering scheduled or prn medications/treatments as ordered  Take Vital Signs  Increase Vital Sign Frequency  Yellow: Q2hr x1, continue Q4hrs until patient remains green for 12hrs  Escalate  MEWS: Escalate Yellow: Discuss with charge nurse and consider notifying provider and/or RRT  Notify: Charge Nurse/RN  Name of Charge Nurse/RN Notified Amandeep  Provider Notification  Provider Name/Title Amy Cox  Date Provider Notified 01/27/24  Time Provider Notified 1759  Method of Notification Page  Notification Reason Other (Comment) (HR elevated, period of afib)  Provider response See new orders  Date of Provider Response 01/27/24  Time of Provider Response 1803  Assess: SIRS CRITERIA  SIRS Temperature  0  SIRS Respirations  0  SIRS Pulse 1  SIRS WBC 0  SIRS Score Sum  1

## 2024-01-27 NOTE — Assessment & Plan Note (Signed)
 At baseline

## 2024-01-27 NOTE — Assessment & Plan Note (Signed)
 Home atorvastatin 80 mg daily resumed

## 2024-01-27 NOTE — ED Provider Notes (Signed)
 [facility] Provider Note    Event Date/Time   First MD Initiated Contact with Patient 01/27/24 [phone]     (approximate)   History   Weakness   HPI  Debra [name] is a 68 y.o. female with a history of metastatic breast cancer, paroxysmal atrial fibrillation who presents with weakness, lethargy per husband.  Patient reports she does not know why EMS was called but when she woke up this morning they were there and brought her to the hospital.  She reports she feels well.  Husband reports that the patient was quite difficult to arouse today.  Per review of records patient missed oncology infusion yesterday because of fatigue and nausea.  No reported fevers     Physical Exam   Triage Vital Signs: ED Triage Vitals  Encounter Vitals Group     BP 01/27/24 1003 115/67     Systolic BP Percentile --      Diastolic BP Percentile --      Pulse Rate 01/27/24 1003 99     Resp 01/27/24 1003 18     Temp 01/27/24 1003 98.9 F (37.2 C)     Temp Source 01/27/24 1003 Oral     SpO2 01/27/24 1002 95 %     Weight 01/27/24 1003 81.6 kg (180 lb)     Height 01/27/24 1003 1.626 m (5' 4)     Head Circumference --      Peak Flow --      Pain Score 01/27/24 1003 0     Pain Loc --      Pain Education --      Exclude from Growth Chart --     Most recent vital signs: Vitals:   01/27/24 1130 01/27/24 1135  BP: (!) 98/56   Pulse: 99   Resp: 18   Temp:    SpO2: 98% 98%     General: Awake, no distress.  CV:  Good peripheral perfusion.  Resp:  Normal effort.  Abd:  No distention.  Bruising to the abdomen from Lovenox  shots Other:     ED Results / Procedures / Treatments   Labs (all labs ordered are listed, but only abnormal results are displayed) Labs Reviewed  COMPREHENSIVE METABOLIC PANEL WITH GFR - Abnormal; Notable for the following components:      Result Value   Glucose, Bld 101 (*)    Calcium  8.7 (*)    Total Protein 6.0 (*)    Albumin 2.4 (*)     Alkaline Phosphatase 142 (*)    All other components within normal limits  CBC WITH DIFFERENTIAL/PLATELET - Abnormal; Notable for the following components:   RBC 3.01 (*)    Hemoglobin 9.3 (*)    HCT 29.4 (*)    RDW 17.2 (*)    Abs Immature Granulocytes 0.15 (*)    All other components within normal limits  CULTURE, BLOOD (ROUTINE X 2)  CULTURE, BLOOD (ROUTINE X 2)  LACTIC ACID, PLASMA  PROTIME-INR  LACTIC ACID, PLASMA  URINALYSIS, W/ REFLEX TO CULTURE (INFECTION SUSPECTED)     EKG  ED ECG REPORT I, Bryson Carbine, the attending physician, personally viewed and interpreted this ECG.  Date: 01/27/2024  Rhythm: Sinus tachycardia QRS Axis: normal Intervals: normal ST/T Wave abnormalities: normal Narrative Interpretation: no evidence of acute ischemia    RADIOLOGY Chest x-ray viewed interpret by me, suspicious for right lobar pneumonia versus worsening metastatic disease    PROCEDURES:  Critical Care performed: yes  CRITICAL  CARE Performed by: Bryson Carbine   Total critical care time: 30 minutes  Critical care time was exclusive of separately billable procedures and treating other patients.  Critical care was necessary to treat or prevent imminent or life-threatening deterioration.  Critical care was time spent personally by me on the following activities: development of treatment plan with patient and/or surrogate as well as nursing, discussions with consultants, evaluation of patient's response to treatment, examination of patient, obtaining history from patient or surrogate, ordering and performing treatments and interventions, ordering and review of laboratory studies, ordering and review of radiographic studies, pulse oximetry and re-evaluation of patient's condition.   Procedures   MEDICATIONS ORDERED IN ED: Medications  levofloxacin  (LEVAQUIN ) IVPB 750 mg (750 mg Intravenous New Bag/Given 01/27/24 1138)  morphine  (PF) 4 MG/ML injection 4 mg (4 mg  Intravenous Given 01/27/24 1154)  ondansetron  (ZOFRAN ) injection 4 mg (4 mg Intravenous Given 01/27/24 1154)     IMPRESSION / MDM / ASSESSMENT AND PLAN / ED COURSE  I reviewed the triage vital signs and the nursing notes. Patient's presentation is most consistent with acute presentation with potential threat to life or bodily function.  Patient with history of metastatic breast cancer as detailed above, presents with weakness, lethargy, possibly some confusion.  Afebrile here however tachycardic.  Blood pressure is normal.  Differential includes infection, sepsis, electrolyte abnormality, no neurodeficits at this time, doubt brain metastases  Lab work demonstrates normal lactic acid, normal white blood cell count however patient is receiving chemotherapy, chest x-ray is consistent with lobar pneumonia  She has a cephalosporin allergy, will treat with IV Levaquin   Have consulted the hospitalist team for admission        FINAL CLINICAL IMPRESSION(S) / ED DIAGNOSES   Final diagnoses:  Community acquired pneumonia of left lower lobe of lung     Rx / DC Orders   ED Discharge Orders     None        Note:  This document was prepared using Dragon voice recognition software and may include unintentional dictation errors.   Bryson Carbine, MD 01/27/24 1209

## 2024-01-27 NOTE — ED Triage Notes (Signed)
 Patient to ED via [facility] from home for generalized weakness and AMS. Pt disoriented to time. Husband reports possible syncopal episode at home this AM. Hx of UTI and lung cancer. Received chemo last week. Wears 2L [place] at baseline.

## 2024-01-27 NOTE — Assessment & Plan Note (Signed)
 Home atorvastatin 80 mg nightly resumed

## 2024-01-27 NOTE — Assessment & Plan Note (Signed)
 Not in acute exacerbation Home maintenance inhalers resumed on admission DuoNebs as needed for shortness of breath and wheezing

## 2024-01-27 NOTE — Assessment & Plan Note (Addendum)
 Status post levofloxacin  750 mg IV one-time dose per EDP Azithromycin  500 mg IV daily, ceftriaxone  2 g IV daily to complete a 5-day course ordered on admission Incentive symmetry, flutter valve

## 2024-01-27 NOTE — Hospital Course (Signed)
 Debra [name] is a 68 year old female with history of hyperlipidemia, hypertension, insomnia, GERD, who presents emergency department from home for chief concerns of generalized weakness and altered mentation.  Vitals in the ED showed temperature of 98.9, respiration rate 17, heart rate 100, blood pressure 150/97, SpO2 98% on 2 L nasal cannula.  Serum sodium is 136, potassium 3.8, chloride 99, bicarb 30, BUN of 12, serum creatinine 0.74, EGFR greater than 60, nonfasting blood glucose 101, WBC 10.0, hemoglobin 9.3, platelets of 299.  Lactic acid was 1.0.  AST, ALT were within normal limits.  ED treatment: Morphine  4 mg IV one-time dose, ondansetron  4 mg IV one-time dose, levofloxacin  750 mg IV one-time dose.

## 2024-01-28 ENCOUNTER — Encounter: Payer: Self-pay | Admitting: Oncology

## 2024-01-28 ENCOUNTER — Ambulatory Visit

## 2024-01-28 DIAGNOSIS — J189 Pneumonia, unspecified organism: Secondary | ICD-10-CM | POA: Diagnosis not present

## 2024-01-28 LAB — BASIC METABOLIC PANEL WITH GFR
Anion gap: 10 (ref 5–15)
BUN: 10 mg/dL (ref 8–23)
CO2: 28 mmol/L (ref 22–32)
Calcium: 8.8 mg/dL — ABNORMAL LOW (ref 8.9–10.3)
Chloride: 100 mmol/L (ref 98–111)
Creatinine, Ser: 0.72 mg/dL (ref 0.44–1.00)
GFR, Estimated: >60 mL/min (ref >60)
Glucose, Bld: 104 mg/dL — ABNORMAL HIGH (ref 70–99)
Potassium: 3.9 mmol/L (ref 3.5–5.1)
Sodium: 138 mmol/L (ref 135–145)

## 2024-01-28 LAB — CBC
HCT: 27.9 % — ABNORMAL LOW (ref 36.0–46.0)
Hemoglobin: 8.8 g/dL — ABNORMAL LOW (ref 12.0–15.0)
MCH: 30.3 pg (ref 26.0–34.0)
MCHC: 31.5 g/dL (ref 30.0–36.0)
MCV: 96.2 fL (ref 80.0–100.0)
Platelets: 292 10*3/uL (ref 150–400)
RBC: 2.9 MIL/uL — ABNORMAL LOW (ref 3.87–5.11)
RDW: 17.2 % — ABNORMAL HIGH (ref 11.5–15.5)
WBC: 9.9 10*3/uL (ref 4.0–10.5)
nRBC: 0 % (ref 0.0–0.2)

## 2024-01-28 LAB — VITAMIN D 25 HYDROXY (VIT D DEFICIENCY, FRACTURES): Vit D, 25-Hydroxy: 28.47 ng/mL — ABNORMAL LOW (ref 30–100)

## 2024-01-28 LAB — MAGNESIUM: Magnesium: 1.7 mg/dL (ref 1.7–2.4)

## 2024-01-28 LAB — PHOSPHORUS: Phosphorus: 3.7 mg/dL (ref 2.5–4.6)

## 2024-01-28 MED ORDER — KETOROLAC TROMETHAMINE 15 MG/ML IJ SOLN
15.0000 mg | Freq: Three times a day (TID) | INTRAMUSCULAR | Status: DC
  Administered 2024-01-28 – 2024-01-29 (×4): 15 mg via INTRAVENOUS
  Filled 2024-01-28 (×4): qty 1

## 2024-01-28 MED ORDER — PROMETHAZINE HCL 25 MG PO TABS: 25.0000 mg | ORAL_TABLET | Freq: Four times a day (QID) | ORAL | Status: DC | PRN

## 2024-01-28 MED ORDER — GUAIFENESIN ER 600 MG PO TB12
600.0000 mg | ORAL_TABLET | Freq: Two times a day (BID) | ORAL | Status: DC
  Administered 2024-01-28 – 2024-01-29 (×3): 600 mg via ORAL
  Filled 2024-01-28 (×3): qty 1

## 2024-01-28 MED ORDER — ENOXAPARIN SODIUM 80 MG/0.8ML IJ SOSY
80.0000 mg | PREFILLED_SYRINGE | Freq: Two times a day (BID) | INTRAMUSCULAR | Status: DC
  Administered 2024-01-28 – 2024-01-29 (×2): 80 mg via SUBCUTANEOUS
  Filled 2024-01-28 (×2): qty 0.8

## 2024-01-28 MED ORDER — PANTOPRAZOLE SODIUM 40 MG PO TBEC
40.0000 mg | DELAYED_RELEASE_TABLET | Freq: Two times a day (BID) | ORAL | Status: DC
  Administered 2024-01-28 – 2024-01-29 (×3): 40 mg via ORAL
  Filled 2024-01-28 (×3): qty 1

## 2024-01-28 MED ORDER — ENOXAPARIN SODIUM 80 MG/0.8ML IJ SOSY
80.0000 mg | PREFILLED_SYRINGE | Freq: Two times a day (BID) | INTRAMUSCULAR | Status: DC
  Filled 2024-01-28: qty 0.8

## 2024-01-28 NOTE — Evaluation (Signed)
 Physical Therapy Evaluation Patient Details Name: Debra [name] MRN: [id number] DOB: [birth year] Today's Date: 01/28/2024  History of Present Illness  Pt is a 68 yo female that presented to ED for AMS, weakness. Workup for PNA. PMH of CVA, HLD, COPD, primary lung adenocarcinoma, afib, breast cancer.  Clinical Impression  Pt alert, agreeable to PT, up in bathroom with OT upon PT arrival. Pt did complain of nausea throughout, and after mobilizing began to vomit, RN notified. Vitals WFLs while sitting on commode. Per pt/family at baseline she is modI for ADLs/mobility, use of rollator/family assistance as needed.   She was able to perform lower body dressing and pericare supervision in sitting. Sit <> stand from commode CGA, and able to take a few steps to sink without rollator, very cautiously. With some encouragement she did ambulate ~4ft total, exhibited baseline chronic gait deviations. Pt did endorse still not feeling like herself mentally or physically, family reported improvement in cognition since admission. Pt up in chair with needs in reach at end of session.  Overall the patient demonstrated deficits (see PT Problem List) that impede the patient's functional abilities, safety, and mobility and would benefit from skilled PT intervention.          If plan is discharge home, recommend the following: A little help with walking and/or transfers;A little help with bathing/dressing/bathroom;Assistance with cooking/housework;Assist for transportation;Help with stairs or ramp for entrance   Can travel by private vehicle        Equipment Recommendations None recommended by PT  Recommendations for Other Services       Functional Status Assessment Patient has had a recent decline in their functional status and demonstrates the ability to make significant improvements in function in a reasonable and predictable amount of time.     Precautions / Restrictions Precautions Precautions:  Fall Recall of Precautions/Restrictions: Intact Restrictions Weight Bearing Restrictions Per Provider Order: No      Mobility  Bed Mobility               General bed mobility comments: pt up in bathroom with OT upon arrival    Transfers Overall transfer level: Needs assistance Equipment used: Rollator (4 wheels), None               General transfer comment: use of commode safety rail    Ambulation/Gait Ambulation/Gait assistance: Contact guard assist Gait Distance (Feet): 65 Feet Assistive device: Rollator (4 wheels)   Gait velocity: decreased     General Gait Details: no LOB but chronic baseline deficits noted from previous CVA.  Stairs            Wheelchair Mobility     Tilt Bed    Modified Rankin (Stroke Patients Only)       Balance Overall balance assessment: Needs assistance Sitting-balance support: Feet supported Sitting balance-Leahy Scale: Good Sitting balance - Comments: pericare supervision, able to perform clothing management   Standing balance support: Bilateral upper extremity supported, During functional activity Standing balance-Leahy Scale: Fair                               Pertinent Vitals/Pain Pain Assessment Pain Assessment:  (pt with complaints of nausea)    Home Living Family/patient expects to be discharged to:: Private residence Living Arrangements: Spouse/significant other Available Help at Discharge: Family Type of Home: Mobile home Home Access: Stairs to enter Entrance Stairs-Rails: Right;Left;Can reach both Entrance Stairs-Number of Steps: 4  Home Layout: One level Home Equipment: Rollator (4 wheels);Shower seat      Prior Function               Mobility Comments: rollator use in home/community ADLs Comments: MOD I-I in ADL/IADL     Extremity/Trunk Assessment   Upper Extremity Assessment Upper Extremity Assessment: Defer to OT evaluation    Lower Extremity Assessment Lower  Extremity Assessment: Generalized weakness       Communication        Cognition Arousal: Alert Behavior During Therapy: WFL for tasks assessed/performed   PT - Cognitive impairments: No apparent impairments                       PT - Cognition Comments: family reported improvement/near return to cognition since  admission. pt endorsed still feeling different physically and mentally Following commands: Intact       Cueing       General Comments      Exercises     Assessment/Plan    PT Assessment Patient needs continued PT services  PT Problem List Decreased strength;Decreased activity tolerance;Decreased balance;Decreased mobility       PT Treatment Interventions DME instruction;Balance training;Gait training;Neuromuscular re-education;Stair training;Functional mobility training;Patient/family education;Therapeutic activities;Therapeutic exercise    PT Goals (Current goals can be found in the Care Plan section)  Acute Rehab PT Goals Patient Stated Goal: to feel better PT Goal Formulation: With patient Time For Goal Achievement: 02/11/24 Potential to Achieve Goals: Good    Frequency Min 2X/week     Co-evaluation               AM-PAC PT 6 Clicks Mobility  Outcome Measure Help needed turning from your back to your side while in a flat bed without using bedrails?: A Little Help needed moving from lying on your back to sitting on the side of a flat bed without using bedrails?: A Little Help needed moving to and from a bed to a chair (including a wheelchair)?: A Little Help needed standing up from a chair using your arms (e.g., wheelchair or bedside chair)?: A Little Help needed to walk in hospital room?: A Little Help needed climbing 3-5 steps with a railing? : A Little 6 Click Score: 18    End of Session   Activity Tolerance:  (limited by nausea) Patient left: in chair;with chair alarm set;with call bell/phone within reach;with family/visitor  present Nurse Communication: Mobility status (need for nausea medicine) PT Visit Diagnosis: Other abnormalities of gait and mobility (R26.89);Difficulty in walking, not elsewhere classified (R26.2);Muscle weakness (generalized) (M62.81)    Time: 7829-5621 PT Time Calculation (min) (ACUTE ONLY): 12 min   Charges:   PT Evaluation $PT Eval Low Complexity: 1 Low   PT General Charges $$ ACUTE PT VISIT: 1 Visit       Darien Eden PT, DPT 10:20 AM,01/28/24

## 2024-01-28 NOTE — Progress Notes (Signed)
 Occupational Therapy Evaluation Patient Details Name: Debra [name] MRN: [id number] DOB: May 13, 1956 Today's Date: 01/28/2024   History of Present Illness   Pt is a 68 yo female that presented to ED for AMS, weakness. Workup for PNA. PMH of CVA, HLD, COPD, primary lung adenocarcinoma, afib, breast cancer.     Clinical Impressions [name] was seen for OT evaluation this date. Prior to hospital admission, pt was IND-MOD I in ADLs. Pt lives with spouse and son. Pt presents to acute OT demonstrating impaired ADL performance and functional mobility 2/2 generalized weakness and decreased activity tolerance (See OT problem list for additional functional deficits). Per spouse, pt is near baseline. Pt currently requires MIN A + HHA from spouse for bed mobility, SUPERVISION for toileting and pericare needs.  Pt reported feeling dizzy and nauseous during toileting, BP 108/71, HR 107, and SpO2 92% on RA.   Pt would benefit from skilled OT services to address noted impairments and functional limitations (see below for any additional details) in order to maximize safety and independence while minimizing falls risk and caregiver burden. Anticipate the need for follow up OT services upon acute hospital DC.      If plan is discharge home, recommend the following:   A little help with walking and/or transfers;A little help with bathing/dressing/bathroom;Assistance with cooking/housework     Functional Status Assessment   Patient has had a recent decline in their functional status and demonstrates the ability to make significant improvements in function in a reasonable and predictable amount of time.     Equipment Recommendations   BSC/3in1     Recommendations for Other Services         Precautions/Restrictions   Precautions Precautions: Fall Recall of Precautions/Restrictions: Intact Restrictions Weight Bearing Restrictions Per Provider Order: No     Mobility Bed Mobility Overal bed  mobility: Needs Assistance Bed Mobility: Supine to Sit     Supine to sit: Min assist (+HHA from spouse)          Transfers Overall transfer level: Needs assistance Equipment used: Rollator (4 wheels) Transfers: Sit to/from Stand Sit to Stand: Contact guard assist                  Balance Overall balance assessment: Needs assistance Sitting-balance support: Feet supported Sitting balance-Leahy Scale: Good     Standing balance support: Bilateral upper extremity supported, During functional activity Standing balance-Leahy Scale: Fair                             ADL either performed or assessed with clinical judgement   ADL Overall ADL's : Needs assistance/impaired                                       General ADL Comments: SUPERVISION for toileting and pericare     Vision         Perception         Praxis         Pertinent Vitals/Pain Pain Assessment Pain Assessment: No/denies pain     Extremity/Trunk Assessment Upper Extremity Assessment Upper Extremity Assessment: Generalized weakness   Lower Extremity Assessment Lower Extremity Assessment: Generalized weakness       Communication Communication Communication: No apparent difficulties   Cognition Arousal: Alert Behavior During Therapy: WFL for tasks assessed/performed Cognition: No apparent impairments  Following commands: Intact       Cueing  General Comments   Cueing Techniques: Verbal cues;Gestural cues  BP 108/71, HR 107, SpO2 92% on RA during toileting   Exercises     Shoulder Instructions      Home Living Family/patient expects to be discharged to:: Private residence Living Arrangements: Spouse/significant other Available Help at Discharge: Family Type of Home: Mobile home Home Access: Stairs to enter Entrance Stairs-Number of Steps: 4 Entrance Stairs-Rails: Right;Left;Can reach both Home Layout:  One level     Bathroom Shower/Tub: Producer, television/film/video: Standard     Home Equipment: Rollator (4 wheels);Shower seat          Prior Functioning/Environment Prior Level of Function : Independent/Modified Independent             Mobility Comments: rollator use in home/community ADLs Comments: MOD I-I in ADL/IADL    OT Problem List: Decreased strength;Decreased activity tolerance;Impaired balance (sitting and/or standing)   OT Treatment/Interventions: Self-care/ADL training      OT Goals(Current goals can be found in the care plan section)   Acute Rehab OT Goals Patient Stated Goal: to go home OT Goal Formulation: With patient/family Time For Goal Achievement: 02/11/24 Potential to Achieve Goals: Good ADL Goals Pt Will Perform Tub/Shower Transfer: Shower transfer;with modified independence;ambulating;shower seat Additional ADL Goal #1: Pt will recall 3 energy conservation strategies in 3/3 trials. Additional ADL Goal #2: Pt will recall 3 fall prevention strategies in 3/3 trials   OT Frequency:  Min 1X/week    Co-evaluation              AM-PAC OT 6 Clicks Daily Activity     Outcome Measure Help from another person eating meals?: None Help from another person taking care of personal grooming?: A Little Help from another person toileting, which includes using toliet, bedpan, or urinal?: A Little Help from another person bathing (including washing, rinsing, drying)?: A Little Help from another person to put on and taking off regular upper body clothing?: None Help from another person to put on and taking off regular lower body clothing?: None 6 Click Score: 21   End of Session Equipment Utilized During Treatment: Rollator (4 wheels)  Activity Tolerance: Patient tolerated treatment well;Patient limited by fatigue Patient left: Other (comment) (left w/ PT)  OT Visit Diagnosis: Unsteadiness on feet (R26.81);Other abnormalities of gait and  mobility (R26.89);Muscle weakness (generalized) (M62.81)                Time: 2952-8413 OT Time Calculation (min): 16 min Charges:  OT General Charges $OT Visit: 1 Visit OT Evaluation $OT Eval Low Complexity: 1 Low OT Treatments $Self Care/Home Management : 8-22 mins  Stevenson Elbe, Student OT   [employer] 01/28/2024, 12:06 PM

## 2024-01-28 NOTE — Progress Notes (Signed)
 Dr Hubert Madden and primary RN made aware that per tele monitoring pt heart rate increasing to 140s-150s, currently at 108

## 2024-01-28 NOTE — Progress Notes (Signed)
 [facility] Progress Note  Patient: Debra [name]    BMW:[id number]  DOA: 01/27/2024     Date of Service: the patient was seen and examined on 01/28/2024  Chief Complaint  Patient presents with   Weakness   Brief hospital course: Ms. Debra [name] is a 68 year old female with history of hyperlipidemia, hypertension, insomnia, GERD, who presents emergency department from home for chief concerns of generalized weakness and altered mentation.   Ed w/up: Vitals Temp 98.9, RR 17, HR 100, BP 150/97, SpO2 98% on 2 L nasal cannula.   S. Na 136, K 3.8, chl 99, bicarb 30, BUN 12, S. Cr 0.74, eGFR> 60, nonfasting BG 101, WBC 10.0, Hb 9.3, plts 299.   Lactic acid was 1.0.  AST, ALT were within normal limits.   ED treatment: Morphine  4 mg IV one-time dose, ondansetron  4 mg IV one-time dose, levofloxacin  750 mg IV one-time dose.   Assessment and Plan:  Community-acquired pneumonia versus postobstructive secondary to underlying lung cancer S/p Levaquin  750 given in the ED Continue ceftriaxone  and azithromycin  Mucinex  extremity gram p.o. twice daily Tussionex as needed Incentive spirometer and flutter valve  History of CVA (cerebrovascular accident) Home atorvastatin  80 mg daily resumed   Neoplasm related pain PDMP reviewed Patient currently has active prescription for pregabalin  150 mg capsule, 30-day supply, 60 quantity, that was filled on 01/16/2024, morphine  sulfate 15 mg tablet, 30-day supply, 60 tablet filled on 12/31/23, oxycodone  10 mg tablet, 15-day supply, 90 quantity written and filled on 01/22/2024 6/12 started Toradol for pain control  H/o paroxysmal A-fib, currently sinus rhythm Patient was on Eliquis  but due to persistent vomiting and nausea she could not take it so patient was transitioned to Lovenox  at home Continued Lovenox  therapeutic dose  Pyuria UA positive for LE and nitrite, WBC >50, bacteria rare Continue above antibiotics Follow urine culture  Symptomatic  anemia At baseline   COPD with chronic bronchitis (HCC) Not in acute exacerbation Home maintenance inhalers resumed on admission DuoNebs as needed for shortness of breath and wheezing   Hyperlipidemia Home atorvastatin  80 mg nightly resumed  GERD: Started PPI 40 mg p.o. twice daily  Body mass index is 30.9 kg/m.  nterventions:   Diet: Heart healthy diet DVT Prophylaxis: Knox therapeutic dose  Advance goals of care discussion: DNR-limited  Family Communication: family was present at bedside, at the time of interview.  The pt provided permission to discuss medical plan with the family. Opportunity was given to ask question and all questions were answered satisfactorily.   Disposition:  Pt is from home, admitted with RLL pneumonia, still on IV antibiotics, which precludes a safe discharge. Discharge to home, when stable, may need few days to improve.  Subjective: No significant events overnight, patient feeling improvement, currently she is at baseline 2 L oxygen  via nasal cannula.  Patient had an episode of vomiting early morning.  Complaining of left-sided backache in the back ribs and left-sided hip.  Patient is chronically on pain management.   Physical Exam: General: NAD, lying comfortably Appear in no distress, affect appropriate Eyes: PERRLA ENT: Oral Mucosa Clear, moist  Neck: no JVD,  Cardiovascular: S1 and S2 Present, no Murmur,  Respiratory: Equal air entry bilaterally, crackles RLL, no wheezes  Abdomen: Bowel Sound present, Soft and no tenderness,  Skin: no rashes Extremities: no Pedal edema, no calf tenderness Neurologic: without any new focal findings Gait not checked due to patient safety concerns  Vitals:   01/27/24 2002 01/28/24 0050 01/28/24 2725  01/28/24 0739  BP: 116/67 120/67 117/71 (!) 108/56  Pulse: 98 (!) 104 (!) 102 (!) 108  Resp: 16   18  Temp: 98.8 F (37.1 C) 98.3 F (36.8 C) 97.9 F (36.6 C) 98.6 F (37 C)  TempSrc: Oral     SpO2:  98% 97% 96% 95%  Weight:      Height:        Intake/Output Summary (Last 24 hours) at 01/28/2024 1205 Last data filed at 01/27/2024 1740 Gross per 24 hour  Intake 250 ml  Output --  Net 250 ml   Filed Weights   01/27/24 1003  Weight: 81.6 kg    Data Reviewed: I have personally reviewed and interpreted daily labs, tele strips, imagings as discussed above. I reviewed all nursing notes, pharmacy notes, vitals, pertinent old records I have discussed plan of care as described above with RN and patient/family.  CBC: Recent Labs  Lab 01/27/24 1005 01/28/24 0529  WBC 10.0 9.9  NEUTROABS 7.4  --   HGB 9.3* 8.8*  HCT 29.4* 27.9*  MCV 97.7 96.2  PLT 299 292   Basic Metabolic Panel: Recent Labs  Lab 01/27/24 1005 01/28/24 0529 01/28/24 0926  NA 136 138  --   K 3.8 3.9  --   CL 99 100  --   CO2 30 28  --   GLUCOSE 101* 104*  --   BUN 12 10  --   CREATININE 0.74 0.72  --   CALCIUM  8.7* 8.8*  --   MG  --   --  1.7  PHOS  --   --  3.7    Studies: No results found.  Scheduled Meds:  atorvastatin   80 mg Oral QHS   budesonide -glycopyrrolate-formoterol   2 puff Inhalation BID   citalopram   10 mg Oral Daily   dexamethasone   1 mg Oral Q breakfast   diltiazem   120 mg Oral Daily   enoxaparin  (LOVENOX ) injection  80 mg Subcutaneous Q12H   feeding supplement  237 mL Oral TID BM   magnesium  chloride  1 tablet Oral Daily   melatonin  15 mg Oral QHS   morphine   15 mg Oral Q12H   pregabalin   150 mg Oral BID   roflumilast   500 mcg Oral Daily   Continuous Infusions:  azithromycin  500 mg (01/28/24 1117)   cefTRIAXone  (ROCEPHIN )  IV Stopped (01/27/24 1513)   PRN Meds: acetaminophen  **OR** acetaminophen , ipratropium-albuterol , metoprolol  tartrate, ondansetron  **OR** ondansetron  (ZOFRAN ) IV, oxyCODONE , promethazine   Time spent: 55 minutes  Author: Althia Atlas. MD [facility] 01/28/2024 12:05 PM  To reach On-call, see care teams to locate the attending and reach out to  them via [email or web address]. If 7PM-7AM, please contact night-coverage If you still have difficulty reaching the attending provider, please page the [facility] (Director on Call) for [facility] on amion for assistance.

## 2024-01-29 ENCOUNTER — Ambulatory Visit

## 2024-01-29 DIAGNOSIS — E785 Hyperlipidemia, unspecified: Secondary | ICD-10-CM

## 2024-01-29 DIAGNOSIS — C50919 Malignant neoplasm of unspecified site of unspecified female breast: Secondary | ICD-10-CM

## 2024-01-29 DIAGNOSIS — G893 Neoplasm related pain (acute) (chronic): Secondary | ICD-10-CM

## 2024-01-29 DIAGNOSIS — I639 Cerebral infarction, unspecified: Secondary | ICD-10-CM

## 2024-01-29 DIAGNOSIS — Z8673 Personal history of transient ischemic attack (TIA), and cerebral infarction without residual deficits: Secondary | ICD-10-CM

## 2024-01-29 DIAGNOSIS — E669 Obesity, unspecified: Secondary | ICD-10-CM | POA: Diagnosis not present

## 2024-01-29 DIAGNOSIS — J4489 Other specified chronic obstructive pulmonary disease: Secondary | ICD-10-CM

## 2024-01-29 DIAGNOSIS — D649 Anemia, unspecified: Secondary | ICD-10-CM

## 2024-01-29 DIAGNOSIS — C3490 Malignant neoplasm of unspecified part of unspecified bronchus or lung: Secondary | ICD-10-CM

## 2024-01-29 LAB — CBC
HCT: 26.9 % — ABNORMAL LOW (ref 36.0–46.0)
Hemoglobin: 8.5 g/dL — ABNORMAL LOW (ref 12.0–15.0)
MCH: 30.6 pg (ref 26.0–34.0)
MCHC: 31.6 g/dL (ref 30.0–36.0)
MCV: 96.8 fL (ref 80.0–100.0)
Platelets: 303 10*3/uL (ref 150–400)
RBC: 2.78 MIL/uL — ABNORMAL LOW (ref 3.87–5.11)
RDW: 17 % — ABNORMAL HIGH (ref 11.5–15.5)
WBC: 9.8 10*3/uL (ref 4.0–10.5)
nRBC: 0 % (ref 0.0–0.2)

## 2024-01-29 LAB — MAGNESIUM: Magnesium: 1.8 mg/dL (ref 1.7–2.4)

## 2024-01-29 LAB — BASIC METABOLIC PANEL WITH GFR
Anion gap: 11 (ref 5–15)
BUN: 14 mg/dL (ref 8–23)
CO2: 27 mmol/L (ref 22–32)
Calcium: 8.4 mg/dL — ABNORMAL LOW (ref 8.9–10.3)
Chloride: 100 mmol/L (ref 98–111)
Creatinine, Ser: 0.71 mg/dL (ref 0.44–1.00)
GFR, Estimated: >60 mL/min (ref >60)
Glucose, Bld: 98 mg/dL (ref 70–99)
Potassium: 3.5 mmol/L (ref 3.5–5.1)
Sodium: 138 mmol/L (ref 135–145)

## 2024-01-29 LAB — PHOSPHORUS: Phosphorus: 3.2 mg/dL (ref 2.5–4.6)

## 2024-01-29 LAB — GLUCOSE, CAPILLARY: Glucose-Capillary: 100 mg/dL — ABNORMAL HIGH (ref 70–99)

## 2024-01-29 MED ORDER — AZITHROMYCIN 500 MG PO TABS
500.0000 mg | ORAL_TABLET | Freq: Every day | ORAL | Status: DC
  Administered 2024-01-29: 500 mg via ORAL
  Filled 2024-01-29: qty 1

## 2024-01-29 MED ORDER — AMOXICILLIN-POT CLAVULANATE 875-125 MG PO TABS: 1.0000 | ORAL_TABLET | Freq: Two times a day (BID) | ORAL | 0 refills | Status: AC

## 2024-01-29 NOTE — Discharge Summary (Signed)
 Physician Discharge Summary   Patient: Debra [name] MRN: [id number] DOB: 01-04-1956  Admit date:     01/27/2024  Discharge date: 01/29/24  Discharge Physician: Roise Cleaver   PCP: Antonio Baumgarten, MD   Recommendations at discharge:   Follow up with PCP and Oncology as scheduled   Discharge Diagnoses: Principal Problem:   Community acquired pneumonia Active Problems:   Recurrent cerebrovascular accidents (CVAs) (HCC)   History of CVA (cerebrovascular accident)   Hyperlipidemia   COPD with chronic bronchitis (HCC)   Invasive carcinoma of breast (HCC)   Symptomatic anemia   Obesity (BMI 30-39.9)   Neoplasm related pain   Primary lung adenocarcinoma (HCC)  Resolved Problems:   * No resolved hospital problems. *  Hospital Course: Debra [name] is a 68 y.o. female with hyperlipidemia, hypertension, insomnia, GERD, lung cancer, history of prior CVA, chronic pain secondary to malignancy, paroxysmal A-fib, anemia, COPD chronically on 2 L, hyperlipidemia, GERD, obesity who presented on this admission with generalized weakness and altered mentation.  In the ED she was found to have pneumonia and admitted for treatment.  She received treatment for community-acquired pneumonia with possible postobstructive concerns given her underlying lung cancer.  By reevaluation in 6/13 patient reported feeling back to her physiologic baseline.  She was consistently on 2 L nasal cannula which is consistent with her home needs.  She will complete her antibiotic course outpatient.  Her son and husband were at bedside at time of evaluation and agreed with plans for discharge home.  Community-acquired pneumonia, possibly postobstructive pneumonia secondary to underlying lung cancer, unspecified organism. - Status post ceftriaxone  azithromycin .  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 Primary lung adenocarcinoma - Follows closely with oncology outpatient. - Currently on palliative radiation, weekly carboplatin  and Taxol . 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  No acute concerns  COPD - Chronically on 2 L O2.  Currently at baseline - As needed DuoNebs - Continue maintenance inhalers  Hyperlipidemia - Continue statin  GERD - PPI  BMI 30 Obesity - Outpatient follow up for lifestyle modification and risk factor management  PDMP reviewed. Consultants: n/a Procedures performed:  n/a Disposition: Home Diet recommendation:  Discharge Diet Orders (From admission, onward)     Start     Ordered   01/29/24 0000  Diet general        01/29/24 1321           Regular diet DISCHARGE MEDICATION: Allergies as of 01/29/2024       Reactions   Cephalexin  Hives   Strawberry Extract Hives        Medication List     TAKE these medications    acetaminophen  500 MG tablet Commonly known as: TYLENOL  Take 1,500 mg by mouth 2 (two) times daily as needed for moderate pain.   alendronate  70 MG tablet Commonly known as: FOSAMAX  Take 70 mg by mouth every Sunday.   atorvastatin  80 MG tablet Commonly known as: LIPITOR  Take 1 tablet (80 mg total) by mouth daily.   citalopram  10 MG tablet Commonly known as:  CELEXA  Take 10 mg by mouth daily.   dexamethasone  1 MG tablet Commonly known as: DECADRON  Take 1 tablet (1 mg total) by mouth daily with breakfast.   diltiazem  120 MG 24 hr capsule Commonly known as: CARDIZEM  CD Take 1 capsule (120 mg total) by mouth daily.   enoxaparin  80 MG/0.8ML injection Commonly known as: LOVENOX  INJECT 0.8 MLS (80 MG  TOTAL) INTO THE SKIN EVERY 12 (TWELVE) HOURS.   feeding supplement Liqd Take 237 mLs by mouth 3 (three) times daily between meals.   magnesium  chloride 64 MG Tbec SR tablet Commonly known as: SLOW-MAG Take 1 tablet (64 mg total) by mouth daily.   Melatonin 5 MG Caps Take 15 mg by mouth at bedtime.   morphine  15 MG 12 hr tablet Commonly known as: MS CONTIN  Take 1 tablet (15 mg total) by mouth every 12 (twelve) hours.   ondansetron  4 MG disintegrating tablet Commonly known as: ZOFRAN -ODT Take 1 tablet (4 mg total) by mouth every 6 (six) hours as needed for nausea, vomiting or refractory nausea / vomiting.   Oxycodone  HCl 10 MG Tabs Take 0.5-1 tablets (5-10 mg total) by mouth every 4 (four) hours as needed.   pantoprazole  40 MG tablet Commonly known as: PROTONIX  Take 1 tablet (40 mg total) by mouth daily.   potassium chloride  10 MEQ tablet Commonly known as: KLOR-CON  M Take 1 tablet (10 mEq total) by mouth 2 (two) times daily.   pregabalin  150 MG capsule Commonly known as: LYRICA  Take 150 mg by mouth 2 (two) times daily.   promethazine  25 MG tablet Commonly known as: PHENERGAN  Take 1 tablet (25 mg total) by mouth every 6 (six) hours as needed for nausea, vomiting or refractory nausea / vomiting (not responding to zofran ).   roflumilast  500 MCG Tabs tablet Commonly known as: DALIRESP  Take 1 tablet by mouth daily.   Trelegy Ellipta 100-62.5-25 MCG/INH Aepb Generic drug: Fluticasone -Umeclidin-Vilant Inhale 1 puff into the lungs daily.        Discharge Exam: Filed Weights   01/27/24 1003  Weight: 81.6 kg   Constitutional:  Normal appearance. Non toxic-appearing.  HENT: Head Normocephalic and atraumatic.  Mucous membranes are moist.  Eyes:  Extraocular intact. Conjunctivae normal. Pupils are equal, round, and reactive to light.  Cardiovascular: Rate and Rhythm: Normal rate and regular rhythm.  Pulmonary: Non labored, symmetric rise of chest wall.  Poor aeration  right lung.  No crackles, wheeze.  On 2 L nasal cannula, no work of breathing appreciated Musculoskeletal:  Normal range of motion.  Skin: warm and dry. not jaundiced.  Neurological: No focal deficit present. alert. Oriented. Psychiatric: Mood and Affect congruent.    Condition at discharge: stable  The results of significant diagnostics from this hospitalization (including imaging, microbiology, ancillary and laboratory) are listed below for reference.   Imaging Studies: DG Chest Port 1 View Result Date: 01/27/2024 CLINICAL DATA:  Sepsis EXAM: PORTABLE CHEST 1 VIEW COMPARISON:  November 17, 2023 FINDINGS: Right IJ Infusaport catheter in good position. Right lower lobe consolidative pulmonary infiltrates and atelectasis with a small effusion could correlate with pneumonia. IMPRESSION: Right lower lobe pneumonia. Electronically Signed   By: Fredrich Jefferson M.D.   On: 01/27/2024 10:51    Microbiology: Results for orders placed or performed during the hospital encounter of 01/27/24  Blood Culture (routine x 2)     Status: None (Preliminary result)   Collection Time: 01/27/24 10:05 AM   Specimen: BLOOD  Result Value Ref Range Status  Specimen Description BLOOD RIGHT [facility]  Final   Special Requests   Final    BOTTLES DRAWN AEROBIC AND ANAEROBIC Blood Culture adequate volume   Culture   Final    NO GROWTH 2 DAYS Performed at [facility], [address] [place], [place] [address]    Report Status PENDING  Incomplete  Blood Culture (routine x 2)     Status: None (Preliminary result)   Collection Time: 01/27/24 10:05 AM   Specimen: BLOOD  Result Value Ref Range Status   Specimen Description BLOOD BLOOD RIGHT HAND  Final   Special Requests   Final    BOTTLES DRAWN AEROBIC ONLY Blood Culture results may not be optimal due to an inadequate volume of blood received in culture bottles   Culture   Final    NO GROWTH 2 DAYS Performed at [facility], [address]  [place], [place] [address]    Report Status PENDING  Incomplete  MRSA Next Gen by PCR, Nasal     Status: None   Collection Time: 01/27/24 12:48 PM   Specimen: Nasal Mucosa; Nasal Swab  Result Value Ref Range Status   MRSA by PCR Next Gen NOT DETECTED NOT DETECTED Final    Comment: (NOTE) The GeneXpert MRSA Assay (FDA approved for NASAL specimens only), is one component of a comprehensive MRSA colonization surveillance program. It is not intended to diagnose MRSA infection nor to guide or monitor treatment for MRSA infections. Test performance is not FDA approved in patients less than 36 years old. Performed at [facility], [address] [place], [place] [address]     Labs: CBC: Recent Labs  Lab 01/27/24 1005 01/28/24 0529 01/29/24 0538  WBC 10.0 9.9 9.8  NEUTROABS 7.4  --   --   HGB 9.3* 8.8* 8.5*  HCT 29.4* 27.9* 26.9*  MCV 97.7 96.2 96.8  PLT 299 292 303   Basic Metabolic Panel: Recent Labs  Lab 01/27/24 1005 01/28/24 0529 01/28/24 0926 01/29/24 0538  NA 136 138  --  138  K 3.8 3.9  --  3.5  CL 99 100  --  100  CO2 30 28  --  27  GLUCOSE 101* 104*  --  98  BUN 12 10  --  14  CREATININE 0.74 0.72  --  0.71  CALCIUM  8.7* 8.8*  --  8.4*  MG  --   --  1.7 1.8  PHOS  --   --  3.7 3.2   Liver Function Tests: Recent Labs  Lab 01/27/24 1005  AST 17  ALT 16  ALKPHOS 142*  BILITOT 0.7  PROT 6.0*  ALBUMIN 2.4*   CBG: Recent Labs  Lab 01/29/24 0750  GLUCAP 100*    Discharge time spent: 32 minutes.  Signed: Chesley Veasey, DO [facility] 01/29/2024

## 2024-01-29 NOTE — Care Management Important Message (Signed)
 Important Message  Patient Details  Name: Debra [name] MRN: [id number] Date of Birth: 27-Sep-1955   Important Message Given:  Yes - Medicare IM     Jawuan Robb W, CMA 01/29/2024, 1:31 PM

## 2024-01-31 LAB — URINE CULTURE: Culture: >=100000 — AB

## 2024-02-01 ENCOUNTER — Ambulatory Visit

## 2024-02-01 ENCOUNTER — Encounter

## 2024-02-01 ENCOUNTER — Ambulatory Visit
Admission: RE | Admit: 2024-02-01 | Discharge: 2024-02-01 | Disposition: A | Discharge status: Home / Self Care | Source: Ambulatory Visit | Attending: Radiation Oncology | Admitting: Radiation Oncology

## 2024-02-01 ENCOUNTER — Ambulatory Visit: Admitting: Radiology

## 2024-02-01 ENCOUNTER — Ambulatory Visit: Payer: Self-pay | Admitting: Family Medicine

## 2024-02-01 LAB — CULTURE, BLOOD (ROUTINE X 2)
Culture: NO GROWTH
Culture: NO GROWTH
Special Requests: ADEQUATE

## 2024-02-01 NOTE — Progress Notes (Signed)
 Result update  Patient discharged 6/13.  Received inbox notification that urine culture has resulted positive for E. coli.  Patient was not treated for UTI while admitted due to lack of UTI symptoms.  I have called and spoken directly with the patient's husband who reports that the patient has been complaining of some abdominal pain and vague nausea.  He believes that her urine is malodorous like it is previously when she has a UTI.  I have called in 3 days of Bactrim  (E Coli sensitive) to complete treatment.

## 2024-02-02 ENCOUNTER — Inpatient Hospital Stay: Admitting: Oncology

## 2024-02-02 ENCOUNTER — Inpatient Hospital Stay: Admitting: Hospice and Palliative Medicine

## 2024-02-02 ENCOUNTER — Telehealth: Payer: Self-pay | Admitting: Oncology

## 2024-02-02 ENCOUNTER — Inpatient Hospital Stay

## 2024-02-02 ENCOUNTER — Ambulatory Visit

## 2024-02-02 DIAGNOSIS — G893 Neoplasm related pain (acute) (chronic): Secondary | ICD-10-CM

## 2024-02-02 DIAGNOSIS — J188 Other pneumonia, unspecified organism: Secondary | ICD-10-CM | POA: Diagnosis not present

## 2024-02-02 DIAGNOSIS — C3490 Malignant neoplasm of unspecified part of unspecified bronchus or lung: Secondary | ICD-10-CM | POA: Diagnosis not present

## 2024-02-02 MED ORDER — MORPHINE SULFATE ER 15 MG PO TBCR: 15.0000 mg | EXTENDED_RELEASE_TABLET | Freq: Three times a day (TID) | ORAL | Status: DC

## 2024-02-02 NOTE — Progress Notes (Signed)
 Virtual Visit via Telephone Note  I connected with Debra [name] on 02/02/24 at  3:00 PM EDT by telephone and verified that I am speaking with the correct person using two identifiers.  Location: Patient: Home Provider: Clinic   I discussed the limitations, risks, security and privacy concerns of performing an evaluation and management service by telephone and the availability of in person appointments. I also discussed with the patient that there may be a patient responsible charge related to this service. The patient expressed understanding and agreed to proceed.   History of Present Illness: Debra [name] is a 69 y.o. 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  Palliative care consulted to address goals and manage ongoing symptoms.    Observations/Objective: Patient was hospitalized 01/27/2024 to 01/29/2024 with community-acquired pneumonia.  I called and spoke with patient and husband.  Reportedly, patient has been fatigued and weak with poor oral intake since discharging home from the hospital.  No fever or chills.  Patient is taking Augmentin .  Reportedly, pain is poorly controlled.  Patient reports that she generally feels better pain relief when she takes MS Contin  but it does not last the full 12 hours.  She is continuing to require oxycodone  frequently throughout the day for breakthrough pain.  Family asks about increasing frequency of MS Contin .  Assessment and Plan: Stage IV breast cancer -patient currently on radiation.  She has follow-up next week with Dr. Wilhelmenia Harada  Community-acquired pneumonia -discharged home on Augmentin .  Oral intake remains poor.  Will bring patient in for labs/possible fluids  Neoplasm related pain -we will increase MS Contin  15 mg every 8 hours.  Continue oxycodone  as needed for breakthrough pain.  Follow Up  Instructions: Labs/possible fluids later this week. Follow up MD and me next week   I discussed the assessment and treatment plan with the patient. The patient was provided an opportunity to ask questions and all were answered. The patient agreed with the plan and demonstrated an understanding of the instructions.   The patient was advised to call back or seek an in-person evaluation if the symptoms worsen or if the condition fails to improve as anticipated.  I provided 10 minutes of non-face-to-face time during this encounter.   Peggyann Bower, NP

## 2024-02-02 NOTE — Telephone Encounter (Signed)
 Pt spouse called and canceled all appts today, stated pt is still sick and has a uti.

## 2024-02-02 NOTE — Telephone Encounter (Signed)
 Please schedule ph visit with palliative for goals of care

## 2024-02-03 ENCOUNTER — Ambulatory Visit

## 2024-02-04 ENCOUNTER — Encounter

## 2024-02-04 ENCOUNTER — Other Ambulatory Visit: Payer: Self-pay | Admitting: Oncology

## 2024-02-04 ENCOUNTER — Inpatient Hospital Stay

## 2024-02-04 ENCOUNTER — Other Ambulatory Visit: Payer: Self-pay

## 2024-02-04 ENCOUNTER — Encounter: Payer: Self-pay | Admitting: Emergency Medicine

## 2024-02-04 ENCOUNTER — Ambulatory Visit

## 2024-02-04 ENCOUNTER — Emergency Department

## 2024-02-04 ENCOUNTER — Other Ambulatory Visit

## 2024-02-04 ENCOUNTER — Inpatient Hospital Stay
Admission: EM | Admit: 2024-02-04 | Discharge: 2024-02-09 | DRG: 193 | Disposition: A | Discharge status: Hospice | Attending: Internal Medicine | Admitting: Internal Medicine

## 2024-02-04 DIAGNOSIS — Z8051 Family history of malignant neoplasm of kidney: Secondary | ICD-10-CM

## 2024-02-04 DIAGNOSIS — Z87891 Personal history of nicotine dependence: Secondary | ICD-10-CM

## 2024-02-04 DIAGNOSIS — D638 Anemia in other chronic diseases classified elsewhere: Secondary | ICD-10-CM | POA: Diagnosis present

## 2024-02-04 DIAGNOSIS — J189 Pneumonia, unspecified organism: Principal | ICD-10-CM | POA: Diagnosis present

## 2024-02-04 DIAGNOSIS — Z823 Family history of stroke: Secondary | ICD-10-CM

## 2024-02-04 DIAGNOSIS — Z17421 Hormone receptor negative with human epidermal growth factor receptor 2 negative status: Secondary | ICD-10-CM

## 2024-02-04 DIAGNOSIS — Z8701 Personal history of pneumonia (recurrent): Secondary | ICD-10-CM

## 2024-02-04 DIAGNOSIS — G893 Neoplasm related pain (acute) (chronic): Secondary | ICD-10-CM | POA: Diagnosis present

## 2024-02-04 DIAGNOSIS — C7801 Secondary malignant neoplasm of right lung: Secondary | ICD-10-CM | POA: Diagnosis present

## 2024-02-04 DIAGNOSIS — I700 Atherosclerosis of aorta: Secondary | ICD-10-CM | POA: Diagnosis present

## 2024-02-04 DIAGNOSIS — Z9011 Acquired absence of right breast and nipple: Secondary | ICD-10-CM

## 2024-02-04 DIAGNOSIS — D630 Anemia in neoplastic disease: Secondary | ICD-10-CM | POA: Diagnosis present

## 2024-02-04 DIAGNOSIS — Z803 Family history of malignant neoplasm of breast: Secondary | ICD-10-CM

## 2024-02-04 DIAGNOSIS — Z818 Family history of other mental and behavioral disorders: Secondary | ICD-10-CM

## 2024-02-04 DIAGNOSIS — Z79899 Other long term (current) drug therapy: Secondary | ICD-10-CM

## 2024-02-04 DIAGNOSIS — J440 Chronic obstructive pulmonary disease with acute lower respiratory infection: Secondary | ICD-10-CM | POA: Diagnosis present

## 2024-02-04 DIAGNOSIS — Z7951 Long term (current) use of inhaled steroids: Secondary | ICD-10-CM

## 2024-02-04 DIAGNOSIS — R110 Nausea: Secondary | ICD-10-CM | POA: Diagnosis present

## 2024-02-04 DIAGNOSIS — G9341 Metabolic encephalopathy: Secondary | ICD-10-CM | POA: Diagnosis present

## 2024-02-04 DIAGNOSIS — I482 Chronic atrial fibrillation, unspecified: Secondary | ICD-10-CM | POA: Diagnosis present

## 2024-02-04 DIAGNOSIS — C50911 Malignant neoplasm of unspecified site of right female breast: Secondary | ICD-10-CM | POA: Diagnosis present

## 2024-02-04 DIAGNOSIS — I480 Paroxysmal atrial fibrillation: Secondary | ICD-10-CM | POA: Diagnosis present

## 2024-02-04 DIAGNOSIS — G8929 Other chronic pain: Secondary | ICD-10-CM | POA: Diagnosis present

## 2024-02-04 DIAGNOSIS — Z170 Estrogen receptor positive status [ER+]: Secondary | ICD-10-CM

## 2024-02-04 DIAGNOSIS — Z8249 Family history of ischemic heart disease and other diseases of the circulatory system: Secondary | ICD-10-CM

## 2024-02-04 DIAGNOSIS — J9611 Chronic respiratory failure with hypoxia: Secondary | ICD-10-CM | POA: Diagnosis present

## 2024-02-04 DIAGNOSIS — J90 Pleural effusion, not elsewhere classified: Secondary | ICD-10-CM

## 2024-02-04 DIAGNOSIS — Z9102 Food additives allergy status: Secondary | ICD-10-CM

## 2024-02-04 DIAGNOSIS — Z79818 Long term (current) use of other agents affecting estrogen receptors and estrogen levels: Secondary | ICD-10-CM

## 2024-02-04 DIAGNOSIS — C50919 Malignant neoplasm of unspecified site of unspecified female breast: Secondary | ICD-10-CM | POA: Diagnosis present

## 2024-02-04 DIAGNOSIS — Z515 Encounter for palliative care: Secondary | ICD-10-CM

## 2024-02-04 DIAGNOSIS — Z66 Do not resuscitate: Secondary | ICD-10-CM | POA: Diagnosis present

## 2024-02-04 DIAGNOSIS — J918 Pleural effusion in other conditions classified elsewhere: Secondary | ICD-10-CM | POA: Diagnosis present

## 2024-02-04 DIAGNOSIS — K219 Gastro-esophageal reflux disease without esophagitis: Secondary | ICD-10-CM | POA: Diagnosis present

## 2024-02-04 DIAGNOSIS — Z8673 Personal history of transient ischemic attack (TIA), and cerebral infarction without residual deficits: Secondary | ICD-10-CM

## 2024-02-04 DIAGNOSIS — I10 Essential (primary) hypertension: Secondary | ICD-10-CM | POA: Diagnosis present

## 2024-02-04 DIAGNOSIS — Z801 Family history of malignant neoplasm of trachea, bronchus and lung: Secondary | ICD-10-CM

## 2024-02-04 DIAGNOSIS — C7951 Secondary malignant neoplasm of bone: Secondary | ICD-10-CM | POA: Diagnosis present

## 2024-02-04 DIAGNOSIS — Z9221 Personal history of antineoplastic chemotherapy: Secondary | ICD-10-CM

## 2024-02-04 DIAGNOSIS — Z808 Family history of malignant neoplasm of other organs or systems: Secondary | ICD-10-CM

## 2024-02-04 DIAGNOSIS — M8458XA Pathological fracture in neoplastic disease, other specified site, initial encounter for fracture: Secondary | ICD-10-CM | POA: Diagnosis present

## 2024-02-04 DIAGNOSIS — Z7983 Long term (current) use of bisphosphonates: Secondary | ICD-10-CM

## 2024-02-04 DIAGNOSIS — Z9981 Dependence on supplemental oxygen: Secondary | ICD-10-CM

## 2024-02-04 DIAGNOSIS — K5909 Other constipation: Secondary | ICD-10-CM | POA: Diagnosis present

## 2024-02-04 DIAGNOSIS — C787 Secondary malignant neoplasm of liver and intrahepatic bile duct: Secondary | ICD-10-CM | POA: Diagnosis present

## 2024-02-04 DIAGNOSIS — Z7901 Long term (current) use of anticoagulants: Secondary | ICD-10-CM

## 2024-02-04 DIAGNOSIS — J9811 Atelectasis: Secondary | ICD-10-CM | POA: Diagnosis present

## 2024-02-04 DIAGNOSIS — E785 Hyperlipidemia, unspecified: Secondary | ICD-10-CM | POA: Diagnosis present

## 2024-02-04 DIAGNOSIS — Z1721 Progesterone receptor positive status: Secondary | ICD-10-CM

## 2024-02-04 DIAGNOSIS — C799 Secondary malignant neoplasm of unspecified site: Secondary | ICD-10-CM

## 2024-02-04 DIAGNOSIS — M8580 Other specified disorders of bone density and structure, unspecified site: Secondary | ICD-10-CM | POA: Diagnosis present

## 2024-02-04 DIAGNOSIS — J9621 Acute and chronic respiratory failure with hypoxia: Secondary | ICD-10-CM | POA: Diagnosis present

## 2024-02-04 DIAGNOSIS — Z813 Family history of other psychoactive substance abuse and dependence: Secondary | ICD-10-CM

## 2024-02-04 DIAGNOSIS — Z881 Allergy status to other antibiotic agents status: Secondary | ICD-10-CM

## 2024-02-04 DIAGNOSIS — Z825 Family history of asthma and other chronic lower respiratory diseases: Secondary | ICD-10-CM

## 2024-02-04 LAB — COMPREHENSIVE METABOLIC PANEL WITH GFR
ALT: 12 U/L (ref 0–44)
AST: 38 U/L (ref 15–41)
Albumin: 2.4 g/dL — ABNORMAL LOW (ref 3.5–5.0)
Alkaline Phosphatase: 255 U/L — ABNORMAL HIGH (ref 38–126)
Anion gap: 15 (ref 5–15)
BUN: 12 mg/dL (ref 8–23)
CO2: 24 mmol/L (ref 22–32)
Calcium: 9.3 mg/dL (ref 8.9–10.3)
Chloride: 93 mmol/L — ABNORMAL LOW (ref 98–111)
Creatinine, Ser: 0.78 mg/dL (ref 0.44–1.00)
GFR, Estimated: >60 mL/min (ref >60)
Glucose, Bld: 137 mg/dL — ABNORMAL HIGH (ref 70–99)
Potassium: 5 mmol/L (ref 3.5–5.1)
Sodium: 132 mmol/L — ABNORMAL LOW (ref 135–145)
Total Bilirubin: 0.8 mg/dL (ref 0.0–1.2)
Total Protein: 6.9 g/dL (ref 6.5–8.1)

## 2024-02-04 LAB — CBC WITH DIFFERENTIAL/PLATELET
Abs Immature Granulocytes: 0 10*3/uL (ref 0.00–0.07)
Basophils Absolute: 0 10*3/uL (ref 0.0–0.1)
Basophils Relative: 0 %
Eosinophils Absolute: 0 10*3/uL (ref 0.0–0.5)
Eosinophils Relative: 0 %
HCT: 30.3 % — ABNORMAL LOW (ref 36.0–46.0)
Hemoglobin: 9.6 g/dL — ABNORMAL LOW (ref 12.0–15.0)
Lymphocytes Relative: 17 %
Lymphs Abs: 3.8 10*3/uL (ref 0.7–4.0)
MCH: 30 pg (ref 26.0–34.0)
MCHC: 31.7 g/dL (ref 30.0–36.0)
MCV: 94.7 fL (ref 80.0–100.0)
Monocytes Absolute: 0.4 10*3/uL (ref 0.1–1.0)
Monocytes Relative: 2 %
Neutro Abs: 18.1 10*3/uL — ABNORMAL HIGH (ref 1.7–7.7)
Neutrophils Relative %: 81 %
Platelets: 407 10*3/uL — ABNORMAL HIGH (ref 150–400)
RBC: 3.2 MIL/uL — ABNORMAL LOW (ref 3.87–5.11)
RDW: 16.6 % — ABNORMAL HIGH (ref 11.5–15.5)
WBC: 22.3 10*3/uL — ABNORMAL HIGH (ref 4.0–10.5)

## 2024-02-04 LAB — PROTIME-INR
INR: 1.8 — ABNORMAL HIGH (ref 0.8–1.2)
Prothrombin Time: 21 s — ABNORMAL HIGH (ref 11.4–15.2)

## 2024-02-04 LAB — LACTIC ACID, PLASMA
Lactic Acid, Venous: 1.7 mmol/L (ref 0.5–1.9)
Lactic Acid, Venous: 1.7 mmol/L (ref 0.5–1.9)

## 2024-02-04 MED ORDER — ACETAMINOPHEN 325 MG RE SUPP: 650.0000 mg | Freq: Four times a day (QID) | RECTAL | Status: DC | PRN

## 2024-02-04 MED ORDER — VANCOMYCIN HCL 750 MG/150ML IV SOLN
750.0000 mg | Freq: Once | INTRAVENOUS | Status: AC
  Administered 2024-02-05: 750 mg via INTRAVENOUS
  Filled 2024-02-04: qty 150

## 2024-02-04 MED ORDER — VANCOMYCIN HCL 1500 MG/300ML IV SOLN: 1500.0000 mg | INTRAVENOUS | Status: DC

## 2024-02-04 MED ORDER — MORPHINE SULFATE ER 15 MG PO TBCR
15.0000 mg | EXTENDED_RELEASE_TABLET | Freq: Three times a day (TID) | ORAL | Status: DC
  Administered 2024-02-04 – 2024-02-08 (×8): 15 mg via ORAL
  Filled 2024-02-04 (×9): qty 1

## 2024-02-04 MED ORDER — SODIUM CHLORIDE 0.9 % IV SOLN
2.0000 g | Freq: Three times a day (TID) | INTRAVENOUS | Status: DC
  Administered 2024-02-04 – 2024-02-07 (×8): 2 g via INTRAVENOUS
  Filled 2024-02-04 (×10): qty 12.5

## 2024-02-04 MED ORDER — SODIUM CHLORIDE 0.9 % IV SOLN
2.0000 g | Freq: Three times a day (TID) | INTRAVENOUS | Status: DC
  Filled 2024-02-04: qty 10

## 2024-02-04 MED ORDER — VANCOMYCIN HCL IN DEXTROSE 1-5 GM/200ML-% IV SOLN
1000.0000 mg | Freq: Once | INTRAVENOUS | Status: AC
  Administered 2024-02-04: 1000 mg via INTRAVENOUS
  Filled 2024-02-04: qty 200

## 2024-02-04 MED ORDER — ALBUTEROL SULFATE (2.5 MG/3ML) 0.083% IN NEBU
2.5000 mg | INHALATION_SOLUTION | RESPIRATORY_TRACT | Status: AC | PRN
Start: 2024-02-04 — End: ?

## 2024-02-04 MED ORDER — ACETAMINOPHEN 325 MG PO TABS
650.0000 mg | ORAL_TABLET | Freq: Four times a day (QID) | ORAL | Status: AC | PRN
Start: 2024-02-04 — End: ?

## 2024-02-04 MED ORDER — ONDANSETRON HCL 4 MG/2ML IJ SOLN
4.0000 mg | Freq: Four times a day (QID) | INTRAMUSCULAR | Status: DC | PRN
  Administered 2024-02-06 – 2024-02-07 (×4): 4 mg via INTRAVENOUS
  Filled 2024-02-04 (×4): qty 2

## 2024-02-04 MED ORDER — ATORVASTATIN CALCIUM 20 MG PO TABS
80.0000 mg | ORAL_TABLET | Freq: Every day | ORAL | Status: DC
  Administered 2024-02-05 – 2024-02-06 (×2): 80 mg via ORAL
  Filled 2024-02-04 (×3): qty 4

## 2024-02-04 MED ORDER — ONDANSETRON HCL 4 MG PO TABS
4.0000 mg | ORAL_TABLET | Freq: Four times a day (QID) | ORAL | Status: DC | PRN
Start: 2024-02-04 — End: 2024-02-09

## 2024-02-04 MED ORDER — CITALOPRAM HYDROBROMIDE 20 MG PO TABS
10.0000 mg | ORAL_TABLET | Freq: Every day | ORAL | Status: DC
  Administered 2024-02-05 – 2024-02-06 (×2): 10 mg via ORAL
  Filled 2024-02-04 (×3): qty 1

## 2024-02-04 MED ORDER — GUAIFENESIN ER 600 MG PO TB12
600.0000 mg | ORAL_TABLET | Freq: Two times a day (BID) | ORAL | Status: DC
  Administered 2024-02-04 – 2024-02-06 (×5): 600 mg via ORAL
  Filled 2024-02-04 (×6): qty 1

## 2024-02-04 MED ORDER — LACTATED RINGERS IV SOLN
150.0000 mL/h | INTRAVENOUS | Status: AC
  Administered 2024-02-04 – 2024-02-05 (×2): 150 mL/h via INTRAVENOUS

## 2024-02-04 MED ORDER — OXYCODONE HCL 5 MG PO TABS
5.0000 mg | ORAL_TABLET | ORAL | Status: DC | PRN
  Administered 2024-02-04 – 2024-02-05 (×3): 10 mg via ORAL
  Filled 2024-02-04 (×3): qty 2

## 2024-02-04 MED ORDER — MAGNESIUM CHLORIDE 64 MG PO TBEC
1.0000 | DELAYED_RELEASE_TABLET | Freq: Every day | ORAL | Status: DC
  Administered 2024-02-05 – 2024-02-06 (×2): 64 mg via ORAL
  Filled 2024-02-04 (×4): qty 1

## 2024-02-04 MED ORDER — DILTIAZEM HCL ER COATED BEADS 120 MG PO CP24
120.0000 mg | ORAL_CAPSULE | Freq: Every day | ORAL | Status: DC
  Administered 2024-02-05 – 2024-02-06 (×2): 120 mg via ORAL
  Filled 2024-02-04 (×5): qty 1

## 2024-02-04 MED ORDER — POTASSIUM CHLORIDE CRYS ER 20 MEQ PO TBCR
10.0000 meq | EXTENDED_RELEASE_TABLET | Freq: Two times a day (BID) | ORAL | Status: DC
  Administered 2024-02-04 – 2024-02-06 (×5): 10 meq via ORAL
  Filled 2024-02-04 (×6): qty 1

## 2024-02-04 NOTE — Consult Note (Signed)
 Pharmacy Antibiotic Note  Debra [name] is a 68 y.o. female admitted on 02/04/2024 with pneumonia.  Pharmacy has been consulted for Cefepime and Vancomycin  dosing.   Plan: Cefepime 2gm IV q 8hrs Vancomycin  1000mg  IV given in ED, will order and additional 750mg  dose to complete 1750mg  LD, then 1500mg  IV q 24hrs  Goal AUC 400-550. Expected AUC: 520 Expected Cmin: 11.8 SCr used: 0.8 (actula 0.78)   Height: 5' 4 (162.6 cm) Weight: 77.1 kg (170 lb) IBW/kg (Calculated) : 54.7  Temp (24hrs), Avg:98.3 F (36.8 C), Min:98.2 F (36.8 C), Max:98.4 F (36.9 C)  Recent Labs  Lab 01/29/24 0538 02/04/24 1743 02/04/24 1810  WBC 9.8 22.3*  --   CREATININE 0.71 0.78  --   LATICACIDVEN  --   --  1.7    Estimated Creatinine Clearance: 66.7 mL/min (by C-G formula based on SCr of 0.78 mg/dL).    Allergies  Allergen Reactions   Cephalexin  Hives   Strawberry Extract Hives    Antimicrobials this admission: Aztreonam 6/19 x 1 Cefepime 6/19  >>  Vancomycin  6/19  Dose adjustments this admission: n/a  Microbiology results: 6/19 BCx: collected 6/19 MRSA PCR: ordered  Debra [name] PharmD, BCPS 02/04/2024 8:39 PM

## 2024-02-04 NOTE — ED Triage Notes (Signed)
 Pt to ED via [facility] from home for increased weakness and shortness of breath. Pt recently hospitalized for sepsis. Pt has been home the past 3 days and reports that since being home she has had increased weakness and not being able to walk. EMS reports pts SpO2 on arrival was in the high 80's low 90's. Pt has hx/o lung cancer and wears 2 liters of O2 at baseline. Pt has been on antibiotics for the past 3 days.  EMS gave the pt Solu-medrol  125 mg and 1 duoneb treatment in route. Pt was also started on bolus of fluid. Pt MAP on arrival 55.

## 2024-02-04 NOTE — Hospital Course (Signed)
 Debra [name]

## 2024-02-04 NOTE — ED Provider Notes (Signed)
 [facility] Provider Note    Event Date/Time   First MD Initiated Contact with Patient 02/04/24 1731     (approximate)   History   SOB   HPI  Debra [name] is a 68 y.o. female who presents to the emergency department today with concerns for shortness of breath.  Patient does have a history of COPD and lung cancer.  Wears 2 L of oxygen  at baseline.  Was recently admitted to the hospital for community-acquired pneumonia.  Discharged 6 days ago.  Since discharge she is felt increasingly weak.  She has had shortness of breath.  Patient denies chest pain but states she does have back pain.  She has had a cough.  Denies any fevers.     Physical Exam   Triage Vital Signs: ED Triage Vitals  Encounter Vitals Group     BP 02/04/24 1738 (!) 122/37     Girls Systolic BP Percentile --      Girls Diastolic BP Percentile --      Boys Systolic BP Percentile --      Boys Diastolic BP Percentile --      Pulse Rate 02/04/24 1738 (!) 121     Resp 02/04/24 1738 18     Temp 02/04/24 1747 98.4 F (36.9 C)     Temp Source 02/04/24 1747 Oral     SpO2 02/04/24 1734 92 %     Weight 02/04/24 1739 170 lb (77.1 kg)     Height 02/04/24 1739 5' 4 (1.626 m)     Head Circumference --      Peak Flow --      Pain Score 02/04/24 1739 8     Pain Loc --      Pain Education --      Exclude from Growth Chart --     Most recent vital signs: Vitals:   02/04/24 1738 02/04/24 1747  BP: (!) 122/37   Pulse: (!) 121   Resp: 18   Temp:  98.4 F (36.9 C)  SpO2: 99%    General: Awake, alert, oriented. CV:  Good peripheral perfusion. Tachycardia. Resp:  Increased work of breathing. Poor air movement diffusely. Abd:  No distention.    ED Results / Procedures / Treatments   Labs (all labs ordered are listed, but only abnormal results are displayed) Labs Reviewed  COMPREHENSIVE METABOLIC PANEL WITH GFR - Abnormal; Notable for the following components:      Result Value   Sodium  132 (*)    Chloride 93 (*)    Glucose, Bld 137 (*)    Albumin 2.4 (*)    Alkaline Phosphatase 255 (*)    All other components within normal limits  CBC WITH DIFFERENTIAL/PLATELET - Abnormal; Notable for the following components:   WBC 22.3 (*)    RBC 3.20 (*)    Hemoglobin 9.6 (*)    HCT 30.3 (*)    RDW 16.6 (*)    Platelets 407 (*)    Neutro Abs 18.1 (*)    All other components within normal limits  PROTIME-INR - Abnormal; Notable for the following components:   Prothrombin Time 21.0 (*)    INR 1.8 (*)    All other components within normal limits  CULTURE, BLOOD (ROUTINE X 2)  CULTURE, BLOOD (ROUTINE X 2)  MRSA NEXT GEN BY PCR, NASAL  LACTIC ACID, PLASMA  LACTIC ACID, PLASMA  URINALYSIS, W/ REFLEX TO CULTURE (INFECTION SUSPECTED)  CORTISOL-AM, BLOOD  EKG  I, Marylynn Soho, attending physician, personally viewed and interpreted this EKG  EKG Time: 1739 Rate: 123 Rhythm: sinus tachycardia Axis: normal Intervals: qtc 418 QRS: narrow, low voltage ST changes: no st elevation Impression: abnormal ekg    RADIOLOGY I independently interpreted and visualized the CXR. My interpretation: worsening right lower sided opacity Radiology interpretation:  IMPRESSION:  1. Marked severity right basilar atelectasis and/or infiltrate,  increased in severity when compared to the prior study.  2. Moderate sized right-sided pleural effusion, also increased in  size.     PROCEDURES:  Critical Care performed: Yes  CRITICAL CARE Performed by: Marylynn Soho   Total critical care time: 30 minutes  Critical care time was exclusive of separately billable procedures and treating other patients.  Critical care was necessary to treat or prevent imminent or life-threatening deterioration.  Critical care was time spent personally by me on the following activities: development of treatment plan with patient and/or surrogate as well as nursing, discussions with consultants,  evaluation of patient's response to treatment, examination of patient, obtaining history from patient or surrogate, ordering and performing treatments and interventions, ordering and review of laboratory studies, ordering and review of radiographic studies, pulse oximetry and re-evaluation of patient's condition.   Procedures    MEDICATIONS ORDERED IN ED: Medications - No data to display   IMPRESSION / MDM / ASSESSMENT AND PLAN / ED COURSE  I reviewed the triage vital signs and the nursing notes.                              Differential diagnosis includes, but is not limited to, pneumonia, anemia, COPD  Patient's presentation is most consistent with acute presentation with potential threat to life or bodily function.   The patient is on the cardiac monitor to evaluate for evidence of arrhythmia and/or significant heart rate changes.  Patient presented to the emergency department today because of concerns for shortness of breath.  Patient has history of COPD, lung cancer and recent hospitalization for pneumonia.  Patient states she is normally on 2 L oxygen  had to be placed on higher levels here. CXR was performed which appears to show worsening airspace disease in the right lower lung.  Additionally blood work is concerning for significant leukocytosis.  Patient was started on antibiotics.  Discussed with Dr. Vallarie Gauze with the hospitalist service who will admit for further workup and management.      FINAL CLINICAL IMPRESSION(S) / ED DIAGNOSES   Final diagnoses:  Pneumonia due to infectious organism, unspecified laterality, unspecified part of lung      Note:  This document was prepared using Dragon voice recognition software and may include unintentional dictation errors.    Marylynn Soho, MD 02/05/24 [phone]
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
## 2024-02-05 ENCOUNTER — Encounter: Payer: Self-pay | Admitting: Oncology

## 2024-02-05 ENCOUNTER — Other Ambulatory Visit: Payer: Self-pay | Admitting: Oncology

## 2024-02-05 ENCOUNTER — Ambulatory Visit

## 2024-02-05 ENCOUNTER — Observation Stay

## 2024-02-05 DIAGNOSIS — C7951 Secondary malignant neoplasm of bone: Secondary | ICD-10-CM | POA: Diagnosis present

## 2024-02-05 DIAGNOSIS — G893 Neoplasm related pain (acute) (chronic): Secondary | ICD-10-CM | POA: Diagnosis present

## 2024-02-05 DIAGNOSIS — G9341 Metabolic encephalopathy: Secondary | ICD-10-CM | POA: Diagnosis present

## 2024-02-05 DIAGNOSIS — K5909 Other constipation: Secondary | ICD-10-CM

## 2024-02-05 DIAGNOSIS — Z7901 Long term (current) use of anticoagulants: Secondary | ICD-10-CM | POA: Diagnosis not present

## 2024-02-05 DIAGNOSIS — Z66 Do not resuscitate: Secondary | ICD-10-CM | POA: Diagnosis present

## 2024-02-05 DIAGNOSIS — I10 Essential (primary) hypertension: Secondary | ICD-10-CM | POA: Diagnosis present

## 2024-02-05 DIAGNOSIS — J189 Pneumonia, unspecified organism: Secondary | ICD-10-CM | POA: Diagnosis present

## 2024-02-05 DIAGNOSIS — G039 Meningitis, unspecified: Secondary | ICD-10-CM | POA: Diagnosis not present

## 2024-02-05 DIAGNOSIS — J90 Pleural effusion, not elsewhere classified: Secondary | ICD-10-CM

## 2024-02-05 DIAGNOSIS — Z8249 Family history of ischemic heart disease and other diseases of the circulatory system: Secondary | ICD-10-CM | POA: Diagnosis not present

## 2024-02-05 DIAGNOSIS — C787 Secondary malignant neoplasm of liver and intrahepatic bile duct: Secondary | ICD-10-CM | POA: Diagnosis present

## 2024-02-05 DIAGNOSIS — Z7983 Long term (current) use of bisphosphonates: Secondary | ICD-10-CM | POA: Diagnosis not present

## 2024-02-05 DIAGNOSIS — J9621 Acute and chronic respiratory failure with hypoxia: Secondary | ICD-10-CM | POA: Diagnosis present

## 2024-02-05 DIAGNOSIS — C50911 Malignant neoplasm of unspecified site of right female breast: Secondary | ICD-10-CM | POA: Diagnosis present

## 2024-02-05 DIAGNOSIS — I700 Atherosclerosis of aorta: Secondary | ICD-10-CM | POA: Diagnosis present

## 2024-02-05 DIAGNOSIS — K219 Gastro-esophageal reflux disease without esophagitis: Secondary | ICD-10-CM | POA: Diagnosis present

## 2024-02-05 DIAGNOSIS — Z515 Encounter for palliative care: Secondary | ICD-10-CM | POA: Diagnosis present

## 2024-02-05 DIAGNOSIS — C7800 Secondary malignant neoplasm of unspecified lung: Secondary | ICD-10-CM | POA: Diagnosis not present

## 2024-02-05 DIAGNOSIS — J918 Pleural effusion in other conditions classified elsewhere: Secondary | ICD-10-CM | POA: Diagnosis present

## 2024-02-05 DIAGNOSIS — J9611 Chronic respiratory failure with hypoxia: Secondary | ICD-10-CM

## 2024-02-05 DIAGNOSIS — G8929 Other chronic pain: Secondary | ICD-10-CM

## 2024-02-05 DIAGNOSIS — C7801 Secondary malignant neoplasm of right lung: Secondary | ICD-10-CM | POA: Diagnosis present

## 2024-02-05 DIAGNOSIS — R4182 Altered mental status, unspecified: Secondary | ICD-10-CM | POA: Diagnosis not present

## 2024-02-05 DIAGNOSIS — Z8673 Personal history of transient ischemic attack (TIA), and cerebral infarction without residual deficits: Secondary | ICD-10-CM

## 2024-02-05 DIAGNOSIS — R110 Nausea: Secondary | ICD-10-CM

## 2024-02-05 DIAGNOSIS — J9811 Atelectasis: Secondary | ICD-10-CM | POA: Diagnosis present

## 2024-02-05 DIAGNOSIS — D630 Anemia in neoplastic disease: Secondary | ICD-10-CM | POA: Diagnosis present

## 2024-02-05 DIAGNOSIS — M8458XA Pathological fracture in neoplastic disease, other specified site, initial encounter for fracture: Secondary | ICD-10-CM | POA: Diagnosis present

## 2024-02-05 DIAGNOSIS — I480 Paroxysmal atrial fibrillation: Secondary | ICD-10-CM | POA: Diagnosis present

## 2024-02-05 DIAGNOSIS — C50919 Malignant neoplasm of unspecified site of unspecified female breast: Secondary | ICD-10-CM | POA: Diagnosis not present

## 2024-02-05 DIAGNOSIS — J440 Chronic obstructive pulmonary disease with acute lower respiratory infection: Secondary | ICD-10-CM | POA: Diagnosis present

## 2024-02-05 DIAGNOSIS — R569 Unspecified convulsions: Secondary | ICD-10-CM | POA: Diagnosis not present

## 2024-02-05 DIAGNOSIS — I482 Chronic atrial fibrillation, unspecified: Secondary | ICD-10-CM | POA: Diagnosis present

## 2024-02-05 DIAGNOSIS — E785 Hyperlipidemia, unspecified: Secondary | ICD-10-CM | POA: Diagnosis present

## 2024-02-05 LAB — BODY FLUID CELL COUNT WITH DIFFERENTIAL
Eos, Fluid: 0 %
Lymphs, Fluid: 19 %
Monocyte-Macrophage-Serous Fluid: 53 %
Neutrophil Count, Fluid: 28 %
Total Nucleated Cell Count, Fluid: 2449 uL

## 2024-02-05 LAB — URINALYSIS, W/ REFLEX TO CULTURE (INFECTION SUSPECTED)
Bilirubin Urine: NEGATIVE
Glucose, UA: NEGATIVE mg/dL
Hgb urine dipstick: NEGATIVE
Ketones, ur: 5 mg/dL — AB
Leukocytes,Ua: NEGATIVE
Nitrite: NEGATIVE
Protein, ur: 30 mg/dL — AB
Specific Gravity, Urine: 1.016 (ref 1.005–1.030)
pH: 5 (ref 5.0–8.0)

## 2024-02-05 LAB — BLOOD CULTURE ID PANEL (REFLEXED) - BCID2

## 2024-02-05 LAB — PROTEIN, PLEURAL OR PERITONEAL FLUID: Total protein, fluid: 3.3 g/dL

## 2024-02-05 LAB — LACTATE DEHYDROGENASE, PLEURAL OR PERITONEAL FLUID: LD, Fluid: 396 U/L — ABNORMAL HIGH (ref 3–23)

## 2024-02-05 LAB — MRSA NEXT GEN BY PCR, NASAL: MRSA by PCR Next Gen: NOT DETECTED

## 2024-02-05 LAB — CORTISOL-AM, BLOOD: Cortisol - AM: 14.2 ug/dL (ref 6.7–22.6)

## 2024-02-05 MED ORDER — ENOXAPARIN SODIUM 80 MG/0.8ML IJ SOSY
80.0000 mg | PREFILLED_SYRINGE | Freq: Two times a day (BID) | INTRAMUSCULAR | Status: DC
  Administered 2024-02-05 – 2024-02-07 (×4): 80 mg via SUBCUTANEOUS
  Filled 2024-02-05 (×4): qty 0.8

## 2024-02-05 MED ORDER — LIDOCAINE HCL (PF) 1 % IJ SOLN
10.0000 mL | Freq: Once | INTRAMUSCULAR | Status: AC
  Administered 2024-02-05: 10 mL via INTRADERMAL

## 2024-02-05 MED ORDER — POLYETHYLENE GLYCOL 3350 17 G PO PACK
17.0000 g | PACK | Freq: Every day | ORAL | Status: DC
  Administered 2024-02-05 – 2024-02-06 (×2): 17 g via ORAL
  Filled 2024-02-05 (×3): qty 1

## 2024-02-05 MED ORDER — DOXYCYCLINE HYCLATE 100 MG PO TABS
100.0000 mg | ORAL_TABLET | Freq: Two times a day (BID) | ORAL | Status: DC
  Administered 2024-02-05 – 2024-02-06 (×3): 100 mg via ORAL
  Filled 2024-02-05 (×3): qty 1

## 2024-02-05 NOTE — Assessment & Plan Note (Addendum)
 As needed Zofran .  If that does not help Phenergan  for breakthrough nausea.  If nausea persist tomorrow may end up needing to get an MRI of the brain with and without contrast to rule out metastases.

## 2024-02-05 NOTE — Procedures (Signed)
 PROCEDURE SUMMARY:  Successful image-guided right-sided diagnostic and therapeutic thoracentesis. Yielded 1.1 liters of clear, straw-colored pleural fluid. Patient tolerated procedure well. EBL: Zero No immediate complications.  Specimen was sent for labs. Post procedure CXR is currently pending.  Please see imaging section of Epic for full dictation.  Gordy Lauber Soliana Kitko PA-C 02/05/2024 12:09 PM

## 2024-02-05 NOTE — Evaluation (Signed)
 Occupational Therapy Evaluation Patient Details Name: Debra [name] MRN: [id number] DOB: 1956/01/23 Today's Date: 02/05/2024   History of Present Illness   Pt is a 68 y.o. female recently admitted from 6/11-6/13 for CAP/postobstructive pneumonia, being readmitted for ongoing symptoms of pneumonia with increased O2 requirement to 6 L. Since her discharge, she has felt increasingly weak along with SOB. PMH of CVA, HLD, COPD on 2L, primary lung adenocarcinoma, afib, breast cancer.     Clinical Impressions Pt was seen for OT evaluation this date. PTA, pt resides at home with her husband and son who assist her as needed. Prior to 2 weeks ago, she was IND with ADLs/IADLs and mobility using rollator.  Pt presents to acute OT demonstrating impaired ADL performance and functional mobility 2/2 weakness, low activity tolerance, balance deficits and pain. Pt currently requires Min A for bed mobility with cueing and assist for trunkal elevation. She required Min A for STS from EOB to RW and CGA to ambulate to the bathroom. Mod A for toilet transfer, but supervision for seated peri-care and Min/CGA for clothing management. Pt with increased fatigue and back pain with activity, needed seated rest break on toilet prior to ambulating back to bed. Pt needing Min/CGA for ambulation back to bed and needed Min/Mod A to make it to the end of bed before giving out. Sp02 on 2L throughout with lowest reading of 89%, HR up to 135 with activity. Pt lateral scooted to [facility] with SBA and returned to supine with supervision. Pt would benefit from skilled OT services to address noted impairments and functional limitations to maximize safety and independence while minimizing falls risk and caregiver burden. Do anticipate the need for follow up OT services upon acute hospital DC < 3 hrs/day, although pt hopes to be able to return home.       If plan is discharge home, recommend the following:   Assistance with cooking/housework;A  lot of help with bathing/dressing/bathroom;A little help with walking and/or transfers;A lot of help with walking and/or transfers     Functional Status Assessment   Patient has had a recent decline in their functional status and demonstrates the ability to make significant improvements in function in a reasonable and predictable amount of time.     Equipment Recommendations   BSC/3in1     Recommendations for Other Services         Precautions/Restrictions   Precautions Precautions: Fall Recall of Precautions/Restrictions: Intact Restrictions Weight Bearing Restrictions Per Provider Order: No     Mobility Bed Mobility Overal bed mobility: Needs Assistance Bed Mobility: Supine to Sit, Sit to Supine     Supine to sit: Min assist Sit to supine: Supervision   General bed mobility comments: for trunkal elevation to get OOB; returned to bed without assist    Transfers Overall transfer level: Needs assistance Equipment used: Rolling walker (2 wheels) Transfers: Sit to/from Stand Sit to Stand: Min assist           General transfer comment: min A for STS from EOB to RW for safety/cueing, Min/Mod A for toilet transfers; amb to bathroom and back, but notable weakness and collapsed onto end of bed d/t weakness and fatigue; HR up to 135 and sp02 lowest 89% on 2L      Balance Overall balance assessment: Needs assistance Sitting-balance support: Feet supported, Single extremity supported Sitting balance-Leahy Scale: Fair Sitting balance - Comments: fatigues easily needing anywhere from CGA to supervision   Standing balance support: Bilateral upper extremity  supported, During functional activity, Reliant on assistive device for balance Standing balance-Leahy Scale: Fair Standing balance comment: Min/CGA, RW use                           ADL either performed or assessed with clinical judgement   ADL Overall ADL's : Needs assistance/impaired                      Lower Body Dressing: Contact guard assist;Minimal assistance;Sitting/lateral leans;Sit to/from stand Lower Body Dressing Details (indicate cue type and reason): to pull over hips in standing Toilet Transfer: Minimal assistance;Rolling walker (2 wheels) Toilet Transfer Details (indicate cue type and reason): Min A for toilet transfer using RW and grab bar with cueing for hand placement Toileting- Clothing Manipulation and Hygiene: Contact guard assist;Sitting/lateral lean;Sit to/from stand               Vision         Perception         Praxis         Pertinent Vitals/Pain Pain Assessment Pain Assessment: Faces Faces Pain Scale: Hurts little more Pain Location: back pain Pain Descriptors / Indicators: Aching Pain Intervention(s): Monitored during session, Repositioned     Extremity/Trunk Assessment Upper Extremity Assessment Upper Extremity Assessment: Generalized weakness   Lower Extremity Assessment Lower Extremity Assessment: Generalized weakness       Communication Communication Communication: No apparent difficulties   Cognition Arousal: Alert Behavior During Therapy: WFL for tasks assessed/performed Cognition: No apparent impairments                               Following commands: Intact       Cueing  General Comments   Cueing Techniques: Verbal cues  dropped to 89% at lowest on 2L, HR up to 135 with ambulation, pt endorses increased weakness   Exercises Other Exercises Other Exercises: Edu on role of OT in acute setting.   Shoulder Instructions      Home Living Family/patient expects to be discharged to:: Private residence Living Arrangements: Spouse/significant other Available Help at Discharge: Family Type of Home: Mobile home Home Access: Stairs to enter Entrance Stairs-Number of Steps: 4 Entrance Stairs-Rails: Right;Left;Can reach both Home Layout: One level     Bathroom Shower/Tub: Company secretary: Standard     Home Equipment: Rollator (4 wheels);Shower seat          Prior Functioning/Environment Prior Level of Function : Independent/Modified Independent             Mobility Comments: rollator use in home/community; reports son has been helping her OOB the last week ADLs Comments: MOD I-I in ADL/IADL baseline; assist from spouse and son the last week    OT Problem List: Decreased strength;Decreased activity tolerance;Impaired balance (sitting and/or standing)   OT Treatment/Interventions: Self-care/ADL training;Therapeutic exercise;Therapeutic activities;Energy conservation;DME and/or AE instruction;Patient/family education;Balance training      OT Goals(Current goals can be found in the care plan section)   Acute Rehab OT Goals Patient Stated Goal: improve weakness OT Goal Formulation: With patient Time For Goal Achievement: 02/19/24 Potential to Achieve Goals: Good ADL Goals Pt Will Perform Lower Body Bathing: sitting/lateral leans;sit to/from stand;with contact guard assist;with min assist Pt Will Perform Lower Body Dressing: with min assist;with contact guard assist;sitting/lateral leans;sit to/from stand Pt Will Transfer to Toilet: with contact guard assist;with  supervision;ambulating;regular height toilet   OT Frequency:  Min 2X/week    Co-evaluation              AM-PAC OT 6 Clicks Daily Activity     Outcome Measure Help from another person eating meals?: None Help from another person taking care of personal grooming?: A Little Help from another person toileting, which includes using toliet, bedpan, or urinal?: A Little Help from another person bathing (including washing, rinsing, drying)?: A Lot Help from another person to put on and taking off regular upper body clothing?: None Help from another person to put on and taking off regular lower body clothing?: A Lot 6 Click Score: 18   End of Session Equipment Utilized During  Treatment: Gait belt;Rolling walker (2 wheels) Nurse Communication: Mobility status  Activity Tolerance: Patient tolerated treatment well;Patient limited by fatigue Patient left: in bed;with call bell/phone within reach;with bed alarm set  OT Visit Diagnosis: Unsteadiness on feet (R26.81);Other abnormalities of gait and mobility (R26.89);Muscle weakness (generalized) (M62.81)                Time: 2536-6440 OT Time Calculation (min): 33 min Charges:  OT General Charges $OT Visit: 1 Visit OT Evaluation $OT Eval Moderate Complexity: 1 Mod OT Treatments $Self Care/Home Management : 8-22 mins Iona Stay, OTR/L 02/05/24, 12:18 PM  Laurita Peron E Nanci Lakatos 02/05/2024, 12:14 PM

## 2024-02-05 NOTE — Assessment & Plan Note (Signed)
Start MiraLAX 

## 2024-02-05 NOTE — Progress Notes (Signed)
 Progress Note   Patient: Debra [name] EAV:[id number] DOB: [birth year] DOA: 02/04/2024     0 DOS: the patient was seen and examined on 02/05/2024   Brief hospital course: 68 y.o. female with medical history significant for HTN, lung cancer, history of prior CVA, chronic pain secondary to malignancy, paroxysmal A-fib on lovenox ,  COPD chronically on 2 L recently admitted from 6/11 - 01/29/24 for CAP/postobstructive pneumonia, being readmitted for ongoing symptoms of pneumonia with increased O2 requirement to 6 L.  Since her discharge, she has felt increasingly weak along with shortness of breath.  Has a continued cough.  Denies fever or chest pain. With EMS, O2 sats in the high 80s to low 90s on 2 L.  Upon arrival she was titrated to up to 6 L.  Tachycardic to the 120s, afebrile, BP 122/37.  Labs notable for WBC 22,000 up from 9.8 at discharge 6 days ago.  Hemoglobin at baseline at 9.6.  Lactic acid 1.7.  INR and CMP unremarkable.  Chest x-ray showed increased filling severity of the right basilar atelectasis/infiltrate compared to prior study as well as increased size moderate right sided pleural effusion. Patient started on aztreonam and vancomycin .  Admission requested   6/20.  With MRSA PCR being negative vancomycin  discontinued and added doxycycline.  Patient on Maxipime.  Thoracentesis ordered.  Assessment and Plan: Recurrent pneumonia Failed outpatient treatment.  Started on Maxipime and added doxycycline.  Vancomycin  discontinued secondary to MRSA PCR being negative.  Pleural effusion, right Thoracentesis ordered.  Could be secondary to history of cancer or pneumonia.  History of CVA (cerebrovascular accident) Will restart Lovenox  injection after thoracentesis.  Paroxysmal atrial fibrillation (HCC) Will resume Lovenox  injection after thoracentesis.  Nausea As needed nausea medications.  If nausea persist may need to MRI of the brain with and without contrast to rule out  metastases  Chronic respiratory failure with hypoxia (HCC) On 2 L  Breast cancer metastasized to multiple sites [facility]) Palliative care team to see.  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  Chronic pain On MS Contin  and oxycodone   Chronic constipation Start MiraLAX        Subjective: Patient felt well prior to discharge last time but then got home and did not eat very much.  Admitted with recurrent pneumonia failing outpatient treatment.  Physical Exam: Vitals:   02/05/24 0044 02/05/24 0429 02/05/24 0801 02/05/24 1112  BP: (!) 109/92 105/61 101/62 (!) 111/59  Pulse: 93 81 81   Resp: 18 18 14 16   Temp: 97.6 F (36.4 C) (!) 97.5 F (36.4 C) (!) 97.5 F (36.4 C)   TempSrc:      SpO2: 100% 97% 96% 93%  Weight:      Height:       Physical Exam HENT:     Head: Normocephalic.     Mouth/Throat:     Pharynx: No oropharyngeal exudate.   Eyes:     General: Lids are normal.     Conjunctiva/sclera: Conjunctivae normal.    Cardiovascular:     Rate and Rhythm: Normal rate and regular rhythm.     Heart sounds: Normal heart sounds, S1 normal and S2 normal.  Pulmonary:     Breath sounds: Examination of the right-middle field reveals decreased breath sounds. Examination of the right-lower field reveals decreased breath sounds and rhonchi. Decreased breath sounds and rhonchi present. No wheezing or rales.  Abdominal:     Palpations: Abdomen is soft.     Tenderness: There is no abdominal tenderness.  Musculoskeletal:     Right lower leg: No swelling.     Left lower leg: No swelling.   Skin:    General: Skin is warm.     Findings: No rash.   Neurological:     Mental Status: She is alert.     Data Reviewed: Sodium 132 potassium 5, creatinine 0.78, alkaline phosphatase 255, albumin 2.4, white blood cell count 22.3, hemoglobin 9.6, platelet count 407, INR 1.8  Family Communication: Husband at bedside  Disposition: Status is: Observation Patient will have thoracentesis  today.  Since she failed outpatient treatment will continue antibiotics today and reassess on a daily basis.  Planned Discharge Destination: Home with Home Health    Time spent: 28 minutes  Author: Verla Glaze, MD 02/05/2024 11:16 AM  For on call review [email or web address].

## 2024-02-05 NOTE — ED Provider Notes (Incomplete)
 [facility] Provider Note    Event Date/Time   First MD Initiated Contact with Patient 02/04/24 1731     (approximate)   History   SOB   HPI  Debra [name] is a 68 y.o. female who presents to the emergency department today with concerns for shortness of breath.  Patient does have a history of COPD and lung cancer.  Wears 2 L of oxygen  at baseline.  Was recently admitted to the hospital for community-acquired pneumonia.  Discharged 6 days ago.  Since discharge she is felt increasingly weak.  She has had shortness of breath.  Patient denies chest pain but states she does have back pain.  She has had a cough.  Denies any fevers.     Physical Exam   Triage Vital Signs: ED Triage Vitals  Encounter Vitals Group     BP 02/04/24 1738 (!) 122/37     Girls Systolic BP Percentile --      Girls Diastolic BP Percentile --      Boys Systolic BP Percentile --      Boys Diastolic BP Percentile --      Pulse Rate 02/04/24 1738 (!) 121     Resp 02/04/24 1738 18     Temp 02/04/24 1747 98.4 F (36.9 C)     Temp Source 02/04/24 1747 Oral     SpO2 02/04/24 1734 92 %     Weight 02/04/24 1739 170 lb (77.1 kg)     Height 02/04/24 1739 5' 4 (1.626 m)     Head Circumference --      Peak Flow --      Pain Score 02/04/24 1739 8     Pain Loc --      Pain Education --      Exclude from Growth Chart --     Most recent vital signs: Vitals:   02/04/24 1738 02/04/24 1747  BP: (!) 122/37   Pulse: (!) 121   Resp: 18   Temp:  98.4 F (36.9 C)  SpO2: 99%    General: Awake, alert, oriented. CV:  Good peripheral perfusion. Tachycardia. Resp:  Increased work of breathing. Poor air movement diffusely. Abd:  No distention.    ED Results / Procedures / Treatments   Labs (all labs ordered are listed, but only abnormal results are displayed) Labs Reviewed  COMPREHENSIVE METABOLIC PANEL WITH GFR - Abnormal; Notable for the following components:      Result Value   Sodium  132 (*)    Chloride 93 (*)    Glucose, Bld 137 (*)    Albumin 2.4 (*)    Alkaline Phosphatase 255 (*)    All other components within normal limits  CBC WITH DIFFERENTIAL/PLATELET - Abnormal; Notable for the following components:   WBC 22.3 (*)    RBC 3.20 (*)    Hemoglobin 9.6 (*)    HCT 30.3 (*)    RDW 16.6 (*)    Platelets 407 (*)    Neutro Abs 18.1 (*)    All other components within normal limits  PROTIME-INR - Abnormal; Notable for the following components:   Prothrombin Time 21.0 (*)    INR 1.8 (*)    All other components within normal limits  CULTURE, BLOOD (ROUTINE X 2)  CULTURE, BLOOD (ROUTINE X 2)  MRSA NEXT GEN BY PCR, NASAL  LACTIC ACID, PLASMA  LACTIC ACID, PLASMA  URINALYSIS, W/ REFLEX TO CULTURE (INFECTION SUSPECTED)  CORTISOL-AM, BLOOD  EKG  I, Marylynn Soho, attending physician, personally viewed and interpreted this EKG  EKG Time: 1739 Rate: 123 Rhythm: sinus tachycardia Axis: normal Intervals: qtc 418 QRS: narrow, low voltage ST changes: no st elevation Impression: abnormal ekg    RADIOLOGY I independently interpreted and visualized the CXR. My interpretation: worsening right lower sided opacity Radiology interpretation:  IMPRESSION:  1. Marked severity right basilar atelectasis and/or infiltrate,  increased in severity when compared to the prior study.  2. Moderate sized right-sided pleural effusion, also increased in  size.     PROCEDURES:  Critical Care performed: Yes  CRITICAL CARE Performed by: Marylynn Soho   Total critical care time: 30 minutes  Critical care time was exclusive of separately billable procedures and treating other patients.  Critical care was necessary to treat or prevent imminent or life-threatening deterioration.  Critical care was time spent personally by me on the following activities: development of treatment plan with patient and/or surrogate as well as nursing, discussions with consultants,  evaluation of patient's response to treatment, examination of patient, obtaining history from patient or surrogate, ordering and performing treatments and interventions, ordering and review of laboratory studies, ordering and review of radiographic studies, pulse oximetry and re-evaluation of patient's condition.   Procedures    MEDICATIONS ORDERED IN ED: Medications - No data to display   IMPRESSION / MDM / ASSESSMENT AND PLAN / ED COURSE  I reviewed the triage vital signs and the nursing notes.                              Differential diagnosis includes, but is not limited to, pneumonia, anemia, COPD  Patient's presentation is most consistent with acute presentation with potential threat to life or bodily function.   The patient is on the cardiac monitor to evaluate for evidence of arrhythmia and/or significant heart rate changes.  Patient presented to the emergency department today because of concerns for shortness of breath.  Patient has history of COPD, lung cancer and recent hospitalization for pneumonia.  Patient states she is normally on 2 L oxygen  had to be placed on higher levels here. CXR was performed which appears to show worsening airspace disease in the right lower lung.  Additionally blood work is concerning for significant leukocytosis.  Patient was started on antibiotics.  Discussed with Dr. Vallarie Gauze with the hospitalist service who will admit for further workup and management.      FINAL CLINICAL IMPRESSION(S) / ED DIAGNOSES   Final diagnoses:  None        Rx / DC Orders   ED Discharge Orders     None        Note:  This document was prepared using Dragon voice recognition software and may include unintentional dictation errors.
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
## 2024-02-05 NOTE — Hospital Course (Addendum)
 68 y.o. female with medical history significant for HTN, metastatic breast cancer to lung bone and liver, history of prior CVA, chronic pain secondary to malignancy, paroxysmal A-fib on lovenox ,  COPD chronically on 2 L recently admitted from 6/11 - 01/29/24 for CAP/postobstructive pneumonia, being readmitted for ongoing symptoms of pneumonia with increased O2 requirement to 6 L.  Since her discharge, she has felt increasingly weak along with shortness of breath.  Has a continued cough.  Denies fever or chest pain. With EMS, O2 sats in the high 80s to low 90s on 2 L.  Upon arrival she was titrated to up to 6 L.  Tachycardic to the 120s, afebrile, BP 122/37.  Labs notable for WBC 22,000 up from 9.8 at discharge 6 days ago.  Hemoglobin at baseline at 9.6.  Lactic acid 1.7.  INR and CMP unremarkable.  Chest x-ray showed increased filling severity of the right basilar atelectasis/infiltrate compared to prior study as well as increased size moderate right sided pleural effusion. Patient started on aztreonam  and vancomycin .  Admission requested   6/20.  With MRSA PCR being negative vancomycin  discontinued and added doxycycline .  Patient on Maxipime .  Thoracentesis removed 1.1 L of fluid. 6/21.  Patient nauseous with vomiting this morning.  Having some back pain.  White blood cell count down to 15.1.  Hemoglobin 8.1. 6/22.  Called by nursing staff to see patient secondary to altered mental status.  Patient was able to hold her arms up in the chair but drifting.  Patient difficult to understand.  Case discussed with neurology and stat CTA and CT venogram were negative for bleed or stroke but did show metastatic disease with lytic lesions on the bone and a C6 fracture pathological fracture.  MRI cervical spine and head with and without contrast ordered. 6/23.  Patient did not hold still for MRI with contrast.  Interventional radiology nervous about doing a LP if you want to hold still.  Palliative care and oncology did  speak with patient and family and they decided to move towards hospice.  We kept antibiotics and fluids overnight. 6/24.  Patient made full comfort care measures.  Appreciate evaluation by hospice liaison.  Patient accepted to hospice at home and hopefully will be transferred today.  Morphine  every 30 minutes, 2 milligrams to try to control pain.

## 2024-02-05 NOTE — Assessment & Plan Note (Addendum)
 Thoracentesis removed 1.1 L.  Could be secondary to history of cancer or pneumonia.  So far no growth in culture.

## 2024-02-05 NOTE — Assessment & Plan Note (Addendum)
 Holding Lovenox  injections since patient is comfort care.  Patient went into rapid atrial fibrillation 2 nights ago.

## 2024-02-05 NOTE — Assessment & Plan Note (Addendum)
 On MS Contin  and oxycodone .  As needed IV morphine .

## 2024-02-05 NOTE — Assessment & Plan Note (Addendum)
 Appreciate palliative care team following.  Metastases to liver, bone and lung.   CT scan showing metastases lytic lesions to the bone and also C6 cervical spine.  Concern for leptomeningeal spread with her altered mental status.  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

## 2024-02-05 NOTE — Progress Notes (Signed)
 PT Cancellation Note  Patient Details Name: Debra [name] MRN: [id number] DOB: [birth year]   Cancelled Treatment:    Reason Eval/Treat Not Completed: Patient at procedure or test/unavailable. Orders received and chart reviewed. Pt off floor for thoracentesis. PT to re-attempt when pt's available as medically appropriate.    Marc Senior. Fairly IV, PT, DPT Physical Therapist- [place]  [facility]  02/05/2024, 11:45 AM

## 2024-02-05 NOTE — Assessment & Plan Note (Addendum)
 Holding Lovenox  injections since patient is now comfort care

## 2024-02-05 NOTE — Assessment & Plan Note (Signed)
 On 2 L. ?

## 2024-02-05 NOTE — Plan of Care (Signed)

## 2024-02-05 NOTE — Progress Notes (Signed)
 PHARMACY - PHYSICIAN COMMUNICATION CRITICAL VALUE ALERT - BLOOD CULTURE IDENTIFICATION (BCID)  Debra [name] is an 68 y.o. female who presented to [facility] on 02/04/2024 with a chief complaint of pneumonia  Assessment:  1 of 4 bottles, aerobic, methicillin-resistant Staph epidermidis (MRSE)  Name of physician (or Provider) Contacted: Elisabeth Guild  Current antibiotics: Cefepime, Doxycycline  Changes to prescribed antibiotics recommended:  Patient is on recommended antibiotics - No changes needed Likely contaminant  Results for orders placed or performed during the hospital encounter of 02/04/24  Blood Culture ID Panel (Reflexed) (Collected: 02/04/2024  6:01 PM)  Result Value Ref Range   Enterococcus faecalis NOT DETECTED NOT DETECTED   Enterococcus Faecium NOT DETECTED NOT DETECTED   Listeria monocytogenes NOT DETECTED NOT DETECTED   Staphylococcus species DETECTED (A) NOT DETECTED   Staphylococcus aureus (BCID) NOT DETECTED NOT DETECTED   Staphylococcus epidermidis DETECTED (A) NOT DETECTED   Staphylococcus lugdunensis NOT DETECTED NOT DETECTED   Streptococcus species NOT DETECTED NOT DETECTED   Streptococcus agalactiae NOT DETECTED NOT DETECTED   Streptococcus pneumoniae NOT DETECTED NOT DETECTED   Streptococcus pyogenes NOT DETECTED NOT DETECTED   A.calcoaceticus-baumannii NOT DETECTED NOT DETECTED   Bacteroides fragilis NOT DETECTED NOT DETECTED   Enterobacterales NOT DETECTED NOT DETECTED   Enterobacter cloacae complex NOT DETECTED NOT DETECTED   Escherichia coli NOT DETECTED NOT DETECTED   Klebsiella aerogenes NOT DETECTED NOT DETECTED   Klebsiella oxytoca NOT DETECTED NOT DETECTED   Klebsiella pneumoniae NOT DETECTED NOT DETECTED   Proteus species NOT DETECTED NOT DETECTED   Salmonella species NOT DETECTED NOT DETECTED   Serratia marcescens NOT DETECTED NOT DETECTED   Haemophilus influenzae NOT DETECTED NOT DETECTED   Neisseria meningitidis NOT DETECTED NOT DETECTED    Pseudomonas aeruginosa NOT DETECTED NOT DETECTED   Stenotrophomonas maltophilia NOT DETECTED NOT DETECTED   Candida albicans NOT DETECTED NOT DETECTED   Candida auris NOT DETECTED NOT DETECTED   Candida glabrata NOT DETECTED NOT DETECTED   Candida krusei NOT DETECTED NOT DETECTED   Candida parapsilosis NOT DETECTED NOT DETECTED   Candida tropicalis NOT DETECTED NOT DETECTED   Cryptococcus neoformans/gattii NOT DETECTED NOT DETECTED   Methicillin resistance mecA/C DETECTED (A) NOT DETECTED    [name] 02/05/2024  7:57 PM

## 2024-02-05 NOTE — Assessment & Plan Note (Addendum)
 Failed outpatient treatment.  Started on Maxipime  and added doxycycline .  Vancomycin  discontinued secondary to MRSA PCR being negative.  With 2 out of 4 blood cultures positive for Staphylococcus epidermidis, this still could be a contamination but with her history, I will be more aggressive with antibiotics and restart vancomycin  and discontinue doxycycline .  Recheck blood cultures tomorrow.

## 2024-02-06 DIAGNOSIS — J189 Pneumonia, unspecified organism: Secondary | ICD-10-CM | POA: Diagnosis not present

## 2024-02-06 DIAGNOSIS — J90 Pleural effusion, not elsewhere classified: Secondary | ICD-10-CM | POA: Diagnosis not present

## 2024-02-06 DIAGNOSIS — D638 Anemia in other chronic diseases classified elsewhere: Secondary | ICD-10-CM

## 2024-02-06 DIAGNOSIS — I480 Paroxysmal atrial fibrillation: Secondary | ICD-10-CM | POA: Diagnosis not present

## 2024-02-06 DIAGNOSIS — Z8673 Personal history of transient ischemic attack (TIA), and cerebral infarction without residual deficits: Secondary | ICD-10-CM | POA: Diagnosis not present

## 2024-02-06 DIAGNOSIS — G894 Chronic pain syndrome: Secondary | ICD-10-CM

## 2024-02-06 LAB — BASIC METABOLIC PANEL WITH GFR
Anion gap: 13 (ref 5–15)
BUN: 22 mg/dL (ref 8–23)
CO2: 23 mmol/L (ref 22–32)
Calcium: 9 mg/dL (ref 8.9–10.3)
Chloride: 100 mmol/L (ref 98–111)
Creatinine, Ser: 0.77 mg/dL (ref 0.44–1.00)
GFR, Estimated: >60 mL/min (ref >60)
Glucose, Bld: 77 mg/dL (ref 70–99)
Potassium: 4.6 mmol/L (ref 3.5–5.1)
Sodium: 136 mmol/L (ref 135–145)

## 2024-02-06 LAB — CBC
HCT: 25 % — ABNORMAL LOW (ref 36.0–46.0)
HCT: 25.9 % — ABNORMAL LOW (ref 36.0–46.0)
Hemoglobin: 7.9 g/dL — ABNORMAL LOW (ref 12.0–15.0)
Hemoglobin: 8.1 g/dL — ABNORMAL LOW (ref 12.0–15.0)
MCH: 30 pg (ref 26.0–34.0)
MCH: 29.5 pg (ref 26.0–34.0)
MCHC: 31.6 g/dL (ref 30.0–36.0)
MCHC: 31.3 g/dL (ref 30.0–36.0)
MCV: 95.1 fL (ref 80.0–100.0)
MCV: 94.2 fL (ref 80.0–100.0)
Platelets: 280 10*3/uL (ref 150–400)
Platelets: 323 10*3/uL (ref 150–400)
RBC: 2.63 MIL/uL — ABNORMAL LOW (ref 3.87–5.11)
RBC: 2.75 MIL/uL — ABNORMAL LOW (ref 3.87–5.11)
RDW: 16.6 % — ABNORMAL HIGH (ref 11.5–15.5)
RDW: 16.7 % — ABNORMAL HIGH (ref 11.5–15.5)
WBC: 15.1 10*3/uL — ABNORMAL HIGH (ref 4.0–10.5)

## 2024-02-06 MED ORDER — MORPHINE SULFATE (PF) 2 MG/ML IV SOLN
2.0000 mg | INTRAVENOUS | Status: DC | PRN
  Administered 2024-02-07 – 2024-02-09 (×12): 2 mg via INTRAVENOUS
  Filled 2024-02-06 (×13): qty 1

## 2024-02-06 MED ORDER — VANCOMYCIN HCL 1750 MG/350ML IV SOLN
1750.0000 mg | Freq: Once | INTRAVENOUS | Status: AC
  Administered 2024-02-06: 1750 mg via INTRAVENOUS
  Filled 2024-02-06: qty 350

## 2024-02-06 MED ORDER — VANCOMYCIN HCL 1500 MG/300ML IV SOLN
1500.0000 mg | INTRAVENOUS | Status: DC
  Filled 2024-02-06: qty 300

## 2024-02-06 MED ORDER — SODIUM CHLORIDE 0.9 % IV SOLN
12.5000 mg | Freq: Four times a day (QID) | INTRAVENOUS | Status: DC | PRN
  Administered 2024-02-06: 12.5 mg via INTRAVENOUS
  Filled 2024-02-06 (×3): qty 0.5
  Filled 2024-02-06: qty 12.5

## 2024-02-06 MED ORDER — LIDOCAINE-PRILOCAINE 2.5-2.5 % EX CREA
TOPICAL_CREAM | Freq: Once | CUTANEOUS | Status: AC
  Filled 2024-02-06: qty 5

## 2024-02-06 NOTE — Plan of Care (Signed)

## 2024-02-06 NOTE — Consult Note (Signed)
 Pharmacy Antibiotic Note  ASSESSMENT: 68 y.o. female with PMH including HTN, lung cancer, breast cancer, hx prior CVA, chronic pain secondary is presenting with concern for bacteremia. Currently both bottles in 1 of 2 sets growing MRSE (not MRSA) and patient with worsening emesis. Pharmacy has been consulted to manage vancomycin  dosing.  PLAN: Administer vancomycin  1750mg  x 1 followed by vancomycin  1500mg  IV q24H thereafter eAUC 528, Cmax 38.5, Cmin 12 Scr 0.8, IBW, Vd 0.72 L/kg Follow up culture results to assess for antibiotic optimization. Monitor renal function to assess for any necessary antibiotic dosing changes.  Patient measurements: Height: 5' 4 (162.6 cm) Weight: 76 kg (167 lb 8.8 oz) IBW/kg (Calculated) : 54.7  Vital signs: Temp: 97.8 F (36.6 C) (06/21 1220) Temp Source: Oral (06/21 1220) BP: 104/46 (06/21 1220) Pulse Rate: 97 (06/21 1220) Recent Labs  Lab 02/04/24 1743 02/06/24 0942 02/06/24 1039  WBC 22.3* PENDING 15.1*  CREATININE 0.78 0.77  --    Estimated Creatinine Clearance: 66.2 mL/min (by C-G formula based on SCr of 0.77 mg/dL).  Allergies: Allergies  Allergen Reactions   Cephalexin  Hives   Strawberry Extract Hives    Antimicrobials this admission: Vancomycin  6/19 x 1 Cefepime  6/19 >> Doxycycline  6/20 >> Vancomycin  6/21 >>  Dose adjustments this admission: N/A  Microbiology results: 6/19 BCx: 1 of 2 sets, MRSE 6/19 Pleural fluid cx: NGTD  6/19 MRSA PCR: negative  Thank you for allowing pharmacy to be a part of this patient's care.  Will M. Lenon, PharmD Clinical Pharmacist 02/06/2024 2:35 PM

## 2024-02-06 NOTE — Progress Notes (Signed)
 PT Cancellation Note  Patient Details Name: Debra [name] MRN: [id number] DOB: [birth year]   Cancelled Treatment:    Reason Eval/Treat Not Completed: Other (comment). Pt began to experience increased nausea and dry heaving upon PT arrival, provided with comfort measures and RN notified of pt status. PT to re-attempt as able.    Doyal Shams PT, DPT 9:10 AM,02/06/24

## 2024-02-06 NOTE — Progress Notes (Signed)
 Arrived to room to access port per iv team consult.  Pt requested emla  cream prior to accessing.  Primary RN has been made aware and notified to place another consult when port is ready to be accessed.

## 2024-02-06 NOTE — Progress Notes (Signed)
 Progress Note   Patient: Debra [name] FMW:[id number] DOB: 21-Oct-1955 DOA: 02/04/2024     1 DOS: the patient was seen and examined on 02/06/2024   Brief hospital course: 68 y.o. female with medical history significant for HTN, metastatic breast cancer, history of prior CVA, chronic pain secondary to malignancy, paroxysmal A-fib on lovenox ,  COPD chronically on 2 L recently admitted from 6/11 - 01/29/24 for CAP/postobstructive pneumonia, being readmitted for ongoing symptoms of pneumonia with increased O2 requirement to 6 L.  Since her discharge, she has felt increasingly weak along with shortness of breath.  Has a continued cough.  Denies fever or chest pain. With EMS, O2 sats in the high 80s to low 90s on 2 L.  Upon arrival she was titrated to up to 6 L.  Tachycardic to the 120s, afebrile, BP 122/37.  Labs notable for WBC 22,000 up from 9.8 at discharge 6 days ago.  Hemoglobin at baseline at 9.6.  Lactic acid 1.7.  INR and CMP unremarkable.  Chest x-ray showed increased filling severity of the right basilar atelectasis/infiltrate compared to prior study as well as increased size moderate right sided pleural effusion. Patient started on aztreonam  and vancomycin .  Admission requested   6/20.  With MRSA PCR being negative vancomycin  discontinued and added doxycycline .  Patient on Maxipime .  Thoracentesis removed 1.1 L of fluid. 6/21.  Patient nauseous with vomiting this morning.  Having some back pain.  White blood cell count down to 15.1.  Hemoglobin 8.1.  Assessment and Plan: * Recurrent pneumonia Failed outpatient treatment.  Started on Maxipime  and added doxycycline .  Vancomycin  discontinued secondary to MRSA PCR being negative.  With 2 out of 4 blood cultures positive for Staphylococcus epidermidis, this still could be a contamination but with her history, I will be more aggressive with antibiotics and restart vancomycin  and discontinue doxycycline .  Recheck blood cultures tomorrow.  Pleural  effusion, right Thoracentesis removed 1.1 L.  Could be secondary to history of cancer or pneumonia.  So far no growth in culture.  History of CVA (cerebrovascular accident) Continue Lovenox  injections  Paroxysmal atrial fibrillation (HCC) Continue Lovenox  injections  Nausea As needed Zofran .  If that does not help Phenergan  for breakthrough nausea.  If nausea persist tomorrow may end up needing to get an MRI of the brain with and without contrast to rule out metastases.  Anemia of chronic disease Hemoglobin down to 8.1.  Continue to monitor.  May end up needing a blood transfusion will get a type and cross tomorrow.  Chronic respiratory failure with hypoxia (HCC) On 2 L  Breast cancer metastasized to multiple sites [facility]) Appreciate palliative care team following.  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  Chronic pain On MS Contin  and oxycodone .  As needed IV morphine .  Chronic constipation Start MiraLAX         Subjective: Patient having nausea vomiting this morning.  Also having back pain especially in her left back and hip area.  Admitted with recurrent pneumonia and large pleural effusion.  Physical Exam: Vitals:   02/06/24 0008 02/06/24 0409 02/06/24 0747 02/06/24 1220  BP: (!) 106/58 108/61 (!) 107/57 (!) 104/46  Pulse: 96 91 94 97  Resp: 16 18 16 18   Temp: 98 F (36.7 C) 98.6 F (37 C) 97.8 F (36.6 C) 97.8 F (36.6 C)  TempSrc:   Oral Oral  SpO2: 98% 99% 99% 100%  Weight:      Height:       Physical Exam HENT:  Head: Normocephalic.     Mouth/Throat:     Pharynx: No oropharyngeal exudate.   Eyes:     General: Lids are normal.     Conjunctiva/sclera: Conjunctivae normal.    Cardiovascular:     Rate and Rhythm: Normal rate and regular rhythm.     Heart sounds: Normal heart sounds, S1 normal and S2 normal.  Pulmonary:     Breath sounds: Examination of the right-lower field reveals decreased breath sounds and rhonchi. Decreased breath sounds and rhonchi  present. No wheezing or rales.  Abdominal:     Palpations: Abdomen is soft.     Tenderness: There is abdominal tenderness in the left lower quadrant.   Musculoskeletal:     Left hip: Bony tenderness present.     Right lower leg: No swelling.     Left lower leg: No swelling.   Skin:    General: Skin is warm.     Findings: No rash.   Neurological:     Mental Status: She is alert.     Data Reviewed: Creatinine 0.77, potassium 4.6, white blood cell count 15.1, hemoglobin 8.1, platelet count 323  Family Communication: Husband at the bedside  Disposition: Status is: Inpatient Remains inpatient appropriate because: If nausea vomiting does not improve we will have to MRI of the brain with and without contrast.  Planned Discharge Destination: Home    Time spent: 28 minutes  Author: Charlie Patterson, MD 02/06/2024 2:23 PM  For on call review [email or web address].

## 2024-02-06 NOTE — Assessment & Plan Note (Signed)
 Last hemoglobin down to 7.6.  No further blood draws since patient is comfort care.

## 2024-02-07 ENCOUNTER — Other Ambulatory Visit: Payer: Self-pay | Admitting: Oncology

## 2024-02-07 ENCOUNTER — Inpatient Hospital Stay

## 2024-02-07 DIAGNOSIS — G039 Meningitis, unspecified: Secondary | ICD-10-CM | POA: Diagnosis not present

## 2024-02-07 DIAGNOSIS — C7951 Secondary malignant neoplasm of bone: Secondary | ICD-10-CM

## 2024-02-07 DIAGNOSIS — Z8673 Personal history of transient ischemic attack (TIA), and cerebral infarction without residual deficits: Secondary | ICD-10-CM | POA: Diagnosis not present

## 2024-02-07 DIAGNOSIS — I482 Chronic atrial fibrillation, unspecified: Secondary | ICD-10-CM

## 2024-02-07 DIAGNOSIS — C7800 Secondary malignant neoplasm of unspecified lung: Secondary | ICD-10-CM

## 2024-02-07 DIAGNOSIS — C50919 Malignant neoplasm of unspecified site of unspecified female breast: Secondary | ICD-10-CM | POA: Diagnosis not present

## 2024-02-07 DIAGNOSIS — J90 Pleural effusion, not elsewhere classified: Secondary | ICD-10-CM | POA: Diagnosis not present

## 2024-02-07 DIAGNOSIS — G9341 Metabolic encephalopathy: Secondary | ICD-10-CM | POA: Diagnosis not present

## 2024-02-07 DIAGNOSIS — J189 Pneumonia, unspecified organism: Secondary | ICD-10-CM | POA: Diagnosis not present

## 2024-02-07 DIAGNOSIS — G893 Neoplasm related pain (acute) (chronic): Secondary | ICD-10-CM

## 2024-02-07 LAB — CBC
HCT: 25.5 % — ABNORMAL LOW (ref 36.0–46.0)
Hemoglobin: 7.9 g/dL — ABNORMAL LOW (ref 12.0–15.0)
MCH: 29.7 pg (ref 26.0–34.0)
MCHC: 31 g/dL (ref 30.0–36.0)
MCV: 95.9 fL (ref 80.0–100.0)
Platelets: 275 10*3/uL (ref 150–400)
RBC: 2.66 MIL/uL — ABNORMAL LOW (ref 3.87–5.11)
RDW: 17 % — ABNORMAL HIGH (ref 11.5–15.5)
WBC: 13.1 10*3/uL — ABNORMAL HIGH (ref 4.0–10.5)
nRBC: 0 % (ref 0.0–0.2)

## 2024-02-07 LAB — BASIC METABOLIC PANEL WITH GFR
Anion gap: 10 (ref 5–15)
BUN: 21 mg/dL (ref 8–23)
CO2: 26 mmol/L (ref 22–32)
Calcium: 9.2 mg/dL (ref 8.9–10.3)
Chloride: 100 mmol/L (ref 98–111)
Creatinine, Ser: 0.76 mg/dL (ref 0.44–1.00)
GFR, Estimated: >60 mL/min (ref >60)
Glucose, Bld: 75 mg/dL (ref 70–99)
Potassium: 4.4 mmol/L (ref 3.5–5.1)
Sodium: 136 mmol/L (ref 135–145)

## 2024-02-07 LAB — TYPE AND SCREEN
ABO/RH(D): A POS
Antibody Screen: NEGATIVE

## 2024-02-07 LAB — CREATININE, SERUM
Creatinine, Ser: 0.77 mg/dL (ref 0.44–1.00)
GFR, Estimated: >60 mL/min (ref >60)

## 2024-02-07 LAB — GLUCOSE, CAPILLARY: Glucose-Capillary: 72 mg/dL (ref 70–99)

## 2024-02-07 LAB — BLOOD GAS, ARTERIAL: Patient temperature: 37

## 2024-02-07 MED ORDER — SODIUM CHLORIDE 0.9 % IV SOLN
2.0000 g | INTRAVENOUS | Status: DC
  Administered 2024-02-07 – 2024-02-09 (×9): 2 g via INTRAVENOUS
  Filled 2024-02-07 (×12): qty 2000

## 2024-02-07 MED ORDER — IOHEXOL 350 MG/ML SOLN
75.0000 mL | Freq: Once | INTRAVENOUS | Status: AC | PRN
  Administered 2024-02-07: 75 mL via INTRAVENOUS

## 2024-02-07 MED ORDER — VANCOMYCIN HCL IN DEXTROSE 1-5 GM/200ML-% IV SOLN
1000.0000 mg | Freq: Two times a day (BID) | INTRAVENOUS | Status: DC
  Administered 2024-02-07 – 2024-02-09 (×4): 1000 mg via INTRAVENOUS
  Filled 2024-02-07 (×3): qty 200

## 2024-02-07 MED ORDER — SODIUM CHLORIDE 0.9 % IV SOLN
2.0000 g | Freq: Two times a day (BID) | INTRAVENOUS | Status: DC
  Administered 2024-02-07 – 2024-02-08 (×3): 2 g via INTRAVENOUS
  Filled 2024-02-07 (×4): qty 20

## 2024-02-07 MED ORDER — SODIUM CHLORIDE 0.9 % IV BOLUS
500.0000 mL | Freq: Once | INTRAVENOUS | Status: AC
  Administered 2024-02-07: 500 mL via INTRAVENOUS

## 2024-02-07 MED ORDER — BISACODYL 10 MG RE SUPP: 10.0000 mg | Freq: Every day | RECTAL | Status: DC | PRN

## 2024-02-07 MED ORDER — LORAZEPAM 2 MG/ML IJ SOLN
0.5000 mg | Freq: Once | INTRAMUSCULAR | Status: AC
  Administered 2024-02-07: 0.5 mg via INTRAVENOUS
  Filled 2024-02-07: qty 1

## 2024-02-07 MED ORDER — LACTULOSE 10 GM/15ML PO SOLN
30.0000 g | Freq: Once | ORAL | Status: DC
  Filled 2024-02-07: qty 60

## 2024-02-07 MED ORDER — SODIUM CHLORIDE 0.9% FLUSH
10.0000 mL | Freq: Two times a day (BID) | INTRAVENOUS | Status: DC
  Administered 2024-02-07: 20 mL
  Administered 2024-02-07 – 2024-02-09 (×3): 10 mL

## 2024-02-07 MED ORDER — DEXTROSE-SODIUM CHLORIDE 5-0.9 % IV SOLN: INTRAVENOUS | Status: AC

## 2024-02-07 MED ORDER — DEXTROSE 5 % IV SOLN
10.0000 mg/kg | Freq: Three times a day (TID) | INTRAVENOUS | Status: DC
  Administered 2024-02-07 – 2024-02-09 (×5): 760 mg via INTRAVENOUS
  Filled 2024-02-07 (×6): qty 15.2

## 2024-02-07 MED ORDER — SODIUM CHLORIDE 0.9 % IV SOLN: 1.0000 g | Freq: Four times a day (QID) | INTRAVENOUS | Status: DC

## 2024-02-07 MED ORDER — CHLORHEXIDINE GLUCONATE CLOTH 2 % EX PADS
6.0000 | MEDICATED_PAD | Freq: Every day | CUTANEOUS | Status: DC
  Administered 2024-02-07: 6 via TOPICAL

## 2024-02-07 MED ORDER — SODIUM CHLORIDE 0.9% FLUSH
10.0000 mL | INTRAVENOUS | Status: DC | PRN
  Administered 2024-02-07: 10 mL

## 2024-02-07 NOTE — Progress Notes (Signed)
 MRI of the brain was done without contrast but it was ordered with and without..  Case discussed with neurology.  MRI of the brain with contrast needed to rule out metastases. Dr. Charlie Patterson

## 2024-02-07 NOTE — Consult Note (Addendum)
 Pharmacy Antiviral Note  Debra [name] is a 68 y.o. female admitted on 02/04/2024 with meningitis.  Pharmacy has been consulted for acyclovir dosing.  Plan: Start acyclovir 10 mg/kg (760 mg) IV every 8 hours Patient currently has D5NS @ 75 ml/hr running Ceftriaxone  2 g IV every 12 hours per provider Ampicillin  2 grams IV every 4 hours per provider Currently on Vancomycin  1500 mg IV every 24 hours per pharmacy dosing, will adjust to vancomycin  1000 mg IV every 12 hours per nomogram due to possible meningitis. Follow-up renal function and cultures for adjustments  Height: 5' 4 (162.6 cm) Weight: 76 kg (167 lb 8.8 oz) IBW/kg (Calculated) : 54.7  Temp (24hrs), Avg:97.9 F (36.6 C), Min:97.6 F (36.4 C), Max:98.4 F (36.9 C)  Recent Labs  Lab 02/04/24 1743 02/04/24 1810 02/04/24 2034 02/06/24 0942 02/06/24 1039 02/07/24 0811 02/07/24 0945  WBC 22.3*  --   --   --  15.1* 13.1*  --   CREATININE 0.78  --   --  0.77  --  0.77 0.76  LATICACIDVEN  --  1.7 1.7  --   --   --   --     Estimated Creatinine Clearance: 66.2 mL/min (by C-G formula based on SCr of 0.76 mg/dL).    Allergies  Allergen Reactions   Cephalexin  Hives   Strawberry Extract Hives    Antimicrobials this admission: Cefepime  6/19 >>6/22 Doxycycline  6/20 >>6/21 Vancomycin  6/21 >> Ceftriaxone  6/22 >> Ampicillin  6/22 >> Acyclovir 6/22 >>   Microbiology results: 6/19 BCx: 2/4 Staph Epi 6/22 Bcx: pending 6/20 Plural fluid: ngtd 6/20 MRSA PCR: not detected  Thank you for allowing pharmacy to be a part of this patient's care.  Kayla JULIANNA Blew, PharmD 02/07/2024 3:14 PM

## 2024-02-07 NOTE — Progress Notes (Signed)
 PT Cancellation Note  Patient Details Name: Debra [name] MRN: [id number] DOB: May 25, 1956   Cancelled Treatment:    Reason Eval/Treat Not Completed: Other (comment). Pt with lab at bedside, and transport waiting for pickup after. PT to re-attempt as able.   Doyal Shams PT, DPT 8:37 AM,02/07/24

## 2024-02-07 NOTE — Progress Notes (Addendum)
 Progress Note   Patient: Debra [name] FMW:[id number] DOB: 1955-12-11 DOA: 02/04/2024     2 DOS: the patient was seen and examined on 02/07/2024   Brief hospital course: 68 y.o. female with medical history significant for HTN, metastatic breast cancer to lung bone and liver, history of prior CVA, chronic pain secondary to malignancy, paroxysmal A-fib on lovenox ,  COPD chronically on 2 L recently admitted from 6/11 - 01/29/24 for CAP/postobstructive pneumonia, being readmitted for ongoing symptoms of pneumonia with increased O2 requirement to 6 L.  Since her discharge, she has felt increasingly weak along with shortness of breath.  Has a continued cough.  Denies fever or chest pain. With EMS, O2 sats in the high 80s to low 90s on 2 L.  Upon arrival she was titrated to up to 6 L.  Tachycardic to the 120s, afebrile, BP 122/37.  Labs notable for WBC 22,000 up from 9.8 at discharge 6 days ago.  Hemoglobin at baseline at 9.6.  Lactic acid 1.7.  INR and CMP unremarkable.  Chest x-ray showed increased filling severity of the right basilar atelectasis/infiltrate compared to prior study as well as increased size moderate right sided pleural effusion. Patient started on aztreonam  and vancomycin .  Admission requested   6/20.  With MRSA PCR being negative vancomycin  discontinued and added doxycycline .  Patient on Maxipime .  Thoracentesis removed 1.1 L of fluid. 6/21.  Patient nauseous with vomiting this morning.  Having some back pain.  White blood cell count down to 15.1.  Hemoglobin 8.1. 6/22.  Called by nursing staff to see patient secondary to altered mental status.  Patient was able to hold her arms up in the chair but drifting.  Patient difficult to understand.  Case discussed with neurology and stat CTA and CT venogram were negative for bleed or stroke but did show metastatic disease with lytic lesions on the bone and a C6 fracture pathological fracture.  MRI cervical spine and head with and without contrast  ordered.  Assessment and Plan: * Acute metabolic encephalopathy Mental status much different today than yesterday.  Patient not eating or wanting to take medication.  Stat CT a of the head and neck and CT venogram were negative for stroke or bleed.  Did show lytic lesions of the bone consistent with metastatic disease and a C6 fracture pathologic.  No large vessel occlusion 2 mm cavernous ICA aneurysm.  MRI of the brain and cervical spine with and without contrast ordered.  Neurological consultation.  Recurrent pneumonia Failed outpatient treatment.  Started on Maxipime  and added doxycycline  on admission.  Vancomycin  discontinued secondary to MRSA PCR being negative.  With 2 out of 4 blood cultures positive for Staphylococcus epidermidis, this still could be a contamination but with her history, I will be more aggressive with antibiotics and restart vancomycin  (on 6/21) and discontinue doxycycline .  Recheck blood cultures today.  Pleural effusion, right Thoracentesis removed 1.1 L.  Could be secondary to history of cancer or pneumonia.  So far no growth in culture.  Cytology pending  History of CVA (cerebrovascular accident) Continue Lovenox  injections.  MRI today of the brain with and without contrast  Paroxysmal atrial fibrillation (HCC) Continue Lovenox  injections  Nausea As needed Zofran  and also as needed Phenergan  for breakthrough nausea.  Anemia of chronic disease Hemoglobin down to 7.9.  Continue to monitor.  May end up needing a blood transfusion.  Chronic respiratory failure with hypoxia (HCC) On 2 L  Breast cancer metastasized to multiple sites [facility]) Appreciate  palliative care team following.  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  MRI of the brain to rule out metastases to the brain.  CT scan showing metastases lytic lesions to the bone and also C6 cervical spine.  Chronic pain On MS Contin  and oxycodone .  As needed IV morphine .  Chronic constipation MiraLAX  if able to  take.        Subjective: Patient with altered mental status this morning.  On high-dose Lovenox  for previous strokes.  Patient not eating or taking medications.  Patient no pain in her neck.  Admitted with recurrent pneumonia.  Physical Exam: Vitals:   02/07/24 0015 02/07/24 0435 02/07/24 0745 02/07/24 1338  BP: (!) 111/47 127/64 123/67 (!) 118/44  Pulse: (!) 104 (!) 112 (!) 110 (!) 110  Resp: 18 18    Temp:  97.6 F (36.4 C) 98.2 F (36.8 C) 97.7 F (36.5 C)  TempSrc:   Oral Oral  SpO2: 100% 99% 98% 100%  Weight:      Height:       Physical Exam HENT:     Head: Normocephalic.     Mouth/Throat:     Pharynx: No oropharyngeal exudate.   Eyes:     General: Lids are normal.     Conjunctiva/sclera: Conjunctivae normal.    Cardiovascular:     Rate and Rhythm: Regular rhythm. Tachycardia present.     Heart sounds: Normal heart sounds, S1 normal and S2 normal.  Pulmonary:     Breath sounds: Examination of the right-lower field reveals decreased breath sounds and rhonchi. Decreased breath sounds and rhonchi present. No wheezing or rales.  Abdominal:     Palpations: Abdomen is soft.     Tenderness: There is abdominal tenderness in the left lower quadrant.   Musculoskeletal:     Left hip: Bony tenderness present.     Right lower leg: No swelling.     Left lower leg: No swelling.   Skin:    General: Skin is warm.     Findings: No rash.   Neurological:     Mental Status: She is lethargic.     Comments: Patient barely able to straight leg raise.  Patient able to hold arms up but drifting with both arms.  Mental status much different than yesterday.    Data Reviewed: ABG with a pH of 7.38 and a PCO2 of 42, chemistry normal range, white blood cell count 13.1, hemoglobin 7.9, platelet count 275, glucose 75  Family Communication: Spoke with husband at the bedside  Disposition: Status is: Inpatient With altered mental status today and patient not eating will have to try  to figure out the cause.  CTA and CT venogram did not show any stroke or metastases but did show lytic lesions in the C6 fracture.  MRI of the cervical spine and brain ordered with contrast to rule out metastases.  Planned Discharge Destination: To be determined    Time spent: 28 minutes  Author: Charlie Patterson, MD 02/07/2024 1:56 PM  For on call review [email or web address].

## 2024-02-07 NOTE — Plan of Care (Signed)
 Pt declining all medications and food. MD notified. MRI x 2 attempted and CT completed. Pt unwilling to lay flat on back for 2nd MRI with contrast. D5 NS fluids started. Blood cultures and lab work repeated. Multiple IV antibiotics given. Incontinent of bowel and bladder.     Problem: Education: Goal: Knowledge of General Education information will improve Description: Including pain rating scale, medication(s)/side effects and non-pharmacologic comfort measures Outcome: Progressing   Problem: Health Behavior/Discharge Planning: Goal: Ability to manage health-related needs will improve Outcome: Progressing   Problem: Clinical Measurements: Goal: Ability to maintain clinical measurements within normal limits will improve Outcome: Progressing Goal: Will remain free from infection Outcome: Progressing Goal: Diagnostic test results will improve Outcome: Progressing Goal: Respiratory complications will improve Outcome: Progressing Goal: Cardiovascular complication will be avoided Outcome: Progressing   Problem: Activity: Goal: Risk for activity intolerance will decrease Outcome: Progressing   Problem: Nutrition: Goal: Adequate nutrition will be maintained Outcome: Progressing   Problem: Coping: Goal: Level of anxiety will decrease Outcome: Progressing   Problem: Elimination: Goal: Will not experience complications related to bowel motility Outcome: Progressing Goal: Will not experience complications related to urinary retention Outcome: Progressing   Problem: Pain Managment: Goal: General experience of comfort will improve and/or be controlled Outcome: Progressing   Problem: Safety: Goal: Ability to remain free from injury will improve Outcome: Progressing   Problem: Skin Integrity: Goal: Risk for impaired skin integrity will decrease Outcome: Progressing   Problem: Fluid Volume: Goal: Hemodynamic stability will improve Outcome: Progressing   Problem: Clinical  Measurements: Goal: Diagnostic test results will improve Outcome: Progressing Goal: Signs and symptoms of infection will decrease Outcome: Progressing   Problem: Respiratory: Goal: Ability to maintain adequate ventilation will improve Outcome: Progressing

## 2024-02-07 NOTE — Assessment & Plan Note (Signed)
 Mental status much different today than yesterday.  Patient not eating or wanting to take medication.  Stat CT a of the head and neck and CT venogram were negative for stroke or bleed.  Did show lytic lesions of the bone consistent with metastatic disease and a C6 fracture pathologic.  No large vessel occlusion 2 mm cavernous ICA aneurysm.  MRI of the brain and cervical spine with and without contrast ordered.  Neurological consultation.

## 2024-02-07 NOTE — Progress Notes (Signed)
 Case discussed with neurology.  MRI with IV contrast still pending Neurology has concern for either meningitis or leptomeningeal spread.  She will order a lumbar puncture for tomorrow.  Will change up antibiotics to acyclovir, ampicillin , Rocephin  and will continue vancomycin .  Dr. Charlie Patterson

## 2024-02-07 NOTE — Progress Notes (Signed)
       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 She is on tele (she has converted to AFIB w RVR, heart rate 142-194)   136/50 Map 71, heart rate 134 (142-194 not sustained)  currently ST  1 min      Pertinent findings on chart review: Progress note reviewed. Known history of a fib Admitted with recurrent pneumonia  Paroxysmal atrial fibrillation (HCC) Continue Lovenox  injections  Patient Assessment   Assessment and  Interventions   Assessment:  A fib with RVR, paroxysmal, non sustained, back in sinus  Plan: Trial of NS bolus x1 X X

## 2024-02-08 ENCOUNTER — Inpatient Hospital Stay

## 2024-02-08 ENCOUNTER — Ambulatory Visit

## 2024-02-08 DIAGNOSIS — G9341 Metabolic encephalopathy: Secondary | ICD-10-CM | POA: Diagnosis not present

## 2024-02-08 DIAGNOSIS — R4182 Altered mental status, unspecified: Secondary | ICD-10-CM

## 2024-02-08 DIAGNOSIS — Z7189 Other specified counseling: Secondary | ICD-10-CM

## 2024-02-08 DIAGNOSIS — J9621 Acute and chronic respiratory failure with hypoxia: Secondary | ICD-10-CM | POA: Insufficient documentation

## 2024-02-08 DIAGNOSIS — R569 Unspecified convulsions: Secondary | ICD-10-CM

## 2024-02-08 DIAGNOSIS — J90 Pleural effusion, not elsewhere classified: Secondary | ICD-10-CM | POA: Diagnosis not present

## 2024-02-08 DIAGNOSIS — Z8673 Personal history of transient ischemic attack (TIA), and cerebral infarction without residual deficits: Secondary | ICD-10-CM | POA: Diagnosis not present

## 2024-02-08 DIAGNOSIS — J189 Pneumonia, unspecified organism: Secondary | ICD-10-CM

## 2024-02-08 DIAGNOSIS — I480 Paroxysmal atrial fibrillation: Secondary | ICD-10-CM | POA: Diagnosis not present

## 2024-02-08 DIAGNOSIS — Z515 Encounter for palliative care: Secondary | ICD-10-CM | POA: Diagnosis not present

## 2024-02-08 DIAGNOSIS — C799 Secondary malignant neoplasm of unspecified site: Secondary | ICD-10-CM

## 2024-02-08 LAB — BASIC METABOLIC PANEL WITH GFR
Anion gap: 8 (ref 5–15)
BUN: 15 mg/dL (ref 8–23)
CO2: 24 mmol/L (ref 22–32)
Calcium: 8.8 mg/dL — ABNORMAL LOW (ref 8.9–10.3)
Chloride: 103 mmol/L (ref 98–111)
Creatinine, Ser: 0.63 mg/dL (ref 0.44–1.00)
GFR, Estimated: >60 mL/min (ref >60)
Glucose, Bld: 123 mg/dL — ABNORMAL HIGH (ref 70–99)
Potassium: 3.4 mmol/L — ABNORMAL LOW (ref 3.5–5.1)
Sodium: 135 mmol/L (ref 135–145)

## 2024-02-08 LAB — BLOOD GAS, ARTERIAL
Bicarbonate: 24.8 mmol/L (ref 20.0–28.0)
O2 Content: 2 L/min
Patient temperature: 37 mmol/L (ref 0.0–2.0)
pCO2 arterial: 42 mmHg (ref 32–48)
pH, Arterial: 7.38 (ref 7.35–7.45)
pO2, Arterial: 24.8 mmol/L (ref 83–108)

## 2024-02-08 LAB — CBC
HCT: 24.2 % — ABNORMAL LOW (ref 36.0–46.0)
Hemoglobin: 7.6 g/dL — ABNORMAL LOW (ref 12.0–15.0)
MCH: 29.3 pg (ref 26.0–34.0)
MCHC: 31.4 g/dL (ref 30.0–36.0)
MCV: 93.4 fL (ref 80.0–100.0)
Platelets: 270 10*3/uL (ref 150–400)
RBC: 2.59 MIL/uL — ABNORMAL LOW (ref 3.87–5.11)
RDW: 16.6 % — ABNORMAL HIGH (ref 11.5–15.5)
WBC: 15.5 10*3/uL — ABNORMAL HIGH (ref 4.0–10.5)

## 2024-02-08 LAB — CULTURE, BLOOD (ROUTINE X 2): Special Requests: ADEQUATE

## 2024-02-08 LAB — BODY FLUID CULTURE W GRAM STAIN: Culture: NO GROWTH

## 2024-02-08 MED ORDER — POTASSIUM CHLORIDE 10 MEQ/100ML IV SOLN
10.0000 meq | INTRAVENOUS | Status: AC
  Administered 2024-02-08 (×2): 10 meq via INTRAVENOUS
  Filled 2024-02-08 (×2): qty 100

## 2024-02-08 MED ORDER — ENSURE PLUS HIGH PROTEIN PO LIQD
237.0000 mL | Freq: Two times a day (BID) | ORAL | Status: DC
  Administered 2024-02-08: 237 mL via ORAL

## 2024-02-08 MED ORDER — METOPROLOL TARTRATE 5 MG/5ML IV SOLN: 5.0000 mg | INTRAVENOUS | Status: DC | PRN

## 2024-02-08 MED ORDER — DEXTROSE-SODIUM CHLORIDE 5-0.9 % IV SOLN: INTRAVENOUS | Status: DC

## 2024-02-08 MED ORDER — LORAZEPAM 2 MG/ML IJ SOLN
0.5000 mg | Freq: Once | INTRAMUSCULAR | Status: AC | PRN
  Administered 2024-02-08: 0.5 mg via INTRAVENOUS
  Filled 2024-02-08: qty 1

## 2024-02-08 MED ORDER — LORAZEPAM 2 MG/ML IJ SOLN: 0.5000 mg | Freq: Once | INTRAMUSCULAR | Status: DC | PRN

## 2024-02-08 NOTE — Progress Notes (Signed)
 PT Cancellation Note  Patient Details Name: Debra [name] MRN: [id number] DOB: 11-26-1955   Cancelled Treatment:    Reason Eval/Treat Not Completed: Other (comment).  PT consult received.  Chart reviewed.  Nurse cleared pt for therapy participation.  Upon PT arrival, pt appearing to be resting in bed with eyes closed; pt able to state name but did not state DOB when asked; pt appeared to indicate willingness to participate in therapy session but pt did not open her eyes or initiate any movement to assist during attempted session.  Nurse updated on pt's status and reports recent medication.  NT and nurse notified of noted BM in bed (NT reported she would come assist pt).  Rigid cervical collar noted in room although pt with order for soft collar--nurse notified who reported she would order soft collar.  Will re-attempt PT evaluation tomorrow.  Damien Caulk, PT 02/08/24, 3:46 PM

## 2024-02-08 NOTE — Care Management Important Message (Signed)
 Important Message  Patient Details  Name: Debra [name] MRN: [id number] Date of Birth: 1956/05/20   Important Message Given:  Yes - Medicare IM     Loma [place] W, CMA 02/08/2024, 2:59 PM

## 2024-02-08 NOTE — Consult Note (Signed)
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 hemorrhage is present. No mass lesion is present. Chronic inferior right mastoid effusion is present. IMPRESSION: 1. Multiple small lytic lesions throughout the visualized skeleton compatible with widespread metastatic disease. 2. Interval pathologic fracture along the superior endplate of C6. 3. No large vessel occlusion or hemodynamically significant stenosis in the head or neck. 4. Stable atherosclerotic calcifications at the aortic arch and cavernous internal carotid arteries bilaterally without focal stenosis. 5. 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 Electronically signed by: Lonni Necessary MD 02/07/2024 09:58 AM EDT RP Workstation: [id number]   DG Chest Port 1 View Result Date: 02/05/2024 CLINICAL DATA:  Pleural effusion. History of metastatic breast cancer EXAM: PORTABLE CHEST 1 VIEW COMPARISON:  X-ray 02/04/2024 FINDINGS: Right IJ chest port in place with tip along the central SVC right atrial junction. Normal cardiopericardial  silhouette. Calcified aorta. Trace right-sided pleural effusion identified. Significantly decreased from previous. Prior thoracentesis. No pneumothorax. Mild right lung base opacity. No edema. No left-sided consolidation. Overlapping cardiac leads. IMPRESSION: Significantly improved right pleural effusion with trace residual. Mild right lung base opacity. No pneumothorax seen. Electronically Signed   By: Ranell Bring M.D.   On: 02/05/2024 14:55   US  THORACENTESIS ASP PLEURAL SPACE W/IMG GUIDE Result Date: 02/05/2024 INDICATION: 68 year old female with a history of lung cancer, currently with pneumonia, and right-sided pleural effusion. IR was requested for diagnostic and therapeutic right sided thoracentesis. EXAM: ULTRASOUND GUIDED DIAGNOSTIC AND THERAPEUTIC THORACENTESIS MEDICATIONS: 8 cc of 1% lidocaine  COMPLICATIONS: None immediate. PROCEDURE: An ultrasound guided thoracentesis was thoroughly discussed with the patient and questions answered. The benefits, risks, alternatives and complications were also discussed. The patient understands and wishes to proceed with the procedure. Written consent was obtained. Ultrasound was performed to localize and mark an adequate pocket of fluid in the right chest. The area was then prepped and draped in the normal sterile fashion. 1% Lidocaine  was used for local anesthesia. Under ultrasound guidance a 6 Fr Safe-T-Centesis catheter was introduced. Thoracentesis was performed. The catheter was removed and a dressing applied. FINDINGS: A total of approximately 1.1 L of clear, straw-colored pleural fluid was removed. Samples were sent to the laboratory as requested by the clinical team. IMPRESSION: Successful ultrasound guided right thoracentesis yielding 1.1 L of pleural fluid. Procedure performed by Carlin Griffon, PA-C Electronically Signed   By: Marcey Moan M.D.   On: 02/05/2024 12:15   DG Chest 1 View Result Date: 02/04/2024 CLINICAL DATA:  Weakness and shortness of  breath. EXAM: CHEST  1 VIEW COMPARISON:  January 27, 2024 FINDINGS: There is stable right-sided venous Port-A-Cath positioning. The heart size and mediastinal contours are within normal limits. There is marked severity right basilar atelectasis and/or infiltrate which is increased in severity when compared to the prior study. Mild to moderate severity similar appearing changes are seen within the mid right lung. A moderate size right-sided pleural effusion is also noted. This is also increased in size. No pneumothorax is identified. Multilevel degenerative changes are seen throughout the thoracic spine. IMPRESSION: 1. Marked severity right basilar atelectasis and/or infiltrate, increased in severity when compared to the prior study. 2. Moderate sized right-sided pleural effusion, also increased in size. Electronically Signed   By: Suzen Dials M.D.   On: 02/04/2024 18:35  DG Chest Port 1 View Result Date: 01/27/2024 CLINICAL DATA:  Sepsis EXAM: PORTABLE CHEST 1 VIEW COMPARISON:  November 17, 2023 FINDINGS: Right IJ Infusaport catheter in good position. Right lower lobe consolidative pulmonary infiltrates and atelectasis with a small effusion could correlate with pneumonia. IMPRESSION: Right lower lobe pneumonia. Electronically Signed   By: Franky Chard M.D.   On: 01/27/2024 10:51   NM PET Image Initial (PI) Skull Base To Thigh Result Date: 12/28/2023 CLINICAL DATA:  Initial treatment strategy for metastatic breast cancer. EXAM: NUCLEAR MEDICINE PET SKULL BASE TO THIGH TECHNIQUE: 9.78 mCi F-18 FDG was injected intravenously. Full-ring PET imaging was performed from the skull base to thigh after the radiotracer. CT data was obtained and used for attenuation correction and anatomic localization. Fasting blood glucose: 133 mg/dl COMPARISON:  CTA head neck 11/11/2023. MRI abdomen 11/11/2023. Chest CT 11/11/2023. Abdomen pelvis CT 11/10/2023. Multiple older exams. FINDINGS: Mediastinal blood pool activity: SUV max  2.9 Liver activity: SUV max 3.4 NECK: No specific abnormal uptake seen in the lymph node change the neck including submandibular, posterior triangle or internal jugular. Exception is some prominent lymph nodes in the thoracic inlet on the right side which have maximum SUV value of 4.7. These measure up to 7 mm on image 34 series 6. Near symmetric uptake of the visualized intracranial compartment. However there is uptake along lesion in the posterior aspect of the left thyroid  gland or immediately adjacent. This has maximum SUV of 11.6. On prior this was measured at 2.6 x 1.5 cm and today 2.8 by 1.5 cm. Please correlate for any known history or prior workup. It is possible this is a lesion adjacent to the thyroid  and not originating from the thyroid  gland. Incidental CT findings: Prominent vascular calcifications are seen. Paranasal sinuses and mastoid air cells seen are grossly clear. Parotid glands and submandibular glands are preserved. CHEST: There are several hypermetabolic enlarged nodes identified in the mediastinum on the right side previous includes paratracheal and subcarinal. Additional nodes in the right hilum of the lung as well. Example of a right paratracheal node has maximum SUV of 6.0 and measures 2.5 x 1.4 cm on series 6, image 48. On the prior examination this same lesion measured 3.0 x 2.0 cm in March 2025, smaller. Other nodes also so some improvement in the mediastinum. Right hilum has some areas of uptake approaching maximum SUV of 4.8. In addition there are some abnormal nodes identified along the internal mammary chain on the right with a focus on PET image 52 of maximum SUV value of 5.3. This node is new from prior CT scan. Increasing right-sided pleural effusion. There are reticulonodular areas seen scattered throughout the right lung with masslike areas greatest in the right lower lobe. These have significant abnormal uptake. Focus in the superior segment of the right lower lobe has  maximum SUV value of 8.5 and on CT image 52 nodular area measures 3.7 x 2.3 cm. On the prior CT scan there is a smaller focus in this location measuring approximately 14 by 10 mm. Again numerous other areas scattered diffusely in the right lung. In addition there is some pleural thickening at the right costophrenic angle which has some uptake. Is spread of disease to the pleura is possible. Few other areas suggested anteriorly in the right hemithorax. No abnormal uptake in the left lung. Incidental CT findings: Heart is nonenlarged. No pericardial effusion. Diffuse vascular calcifications. Right IJ chest port in place with tip extending to the right atrium.  Slightly patulous thoracic esophagus. Breathing motion. No left-sided pleural effusion. Underlying emphysematous lung changes. Nodular thickening along the right breast is again identified. ABDOMEN/PELVIS: There are some scattered foci of abnormal uptake in the liver consistent with known liver metastases. On MRI example lesion was measured in segment 5 at 11 mm. Today this focus measures 13 mm. Maximum SUV value of 5.7. This is measured on image 87. Few other scattered foci as well are slightly enlarged. Not every lesion shows abnormal uptake on this examination. There is a left adrenal gland area of uptake with maximum SUV value 5.0. Lesion today measures 15 mm on image 84 and on the prior examinations 10 mm. There is physiologic uptake elsewhere along the parenchymal organs, bowel and renal collecting systems. Incidental CT findings: Diffuse colonic stool identified. Uterus is present. Diffuse vascular calcifications. No abnormal calcifications seen within either kidney nor along the course of either ureter. Contracted urinary bladder. Stable dilatation of the biliary tree. Prior cholecystectomy. SKELETON: Multifocal areas of abnormal uptake along the skeleton is consistent with osseous metastatic disease. This includes areas along the spine, femurs, humeri,  pelvis, ribs, sternum as well as along the skull. Example of uptake seen at the L2 vertebral body has maximum SUV of 7.3. There is associated areas of compression deformities along several vertebral bodies including L4, L1 which could be more Schmorl's node. Incidental CT findings: Scattered degenerative changes along the spine and other structures. Pins along the right femoral head. IMPRESSION: Extensive metastatic disease identified. On prior CT scans there were several mediastinal and right hilar nodes. Some of these have decreased in size however there is extensive increase in right lung hypermetabolic mass areas as well as consolidation, interstitial septal thickening and pleural thickening worrisome for lymphangitic spread of disease. Increasing right-sided small pleural effusion. Additional nodes in the thoracic inlet. There is particularly hypermetabolic lesion along the posterior margin of the left thyroid  gland. This could be medially adjacent to the thyroid  versus emanating directly from the thyroid  gland. Extensive osseous metastatic disease diffusely distributed. Multiple liver metastases identified. Not all lesions seen previously are hypermetabolic compared to background liver activity. A few are larger in size slightly. Increasing size of hypermetabolic left adrenal nodule. Electronically Signed   By: Ranell Bring M.D.   On: 12/28/2023 13:59   IR IMAGING GUIDED PORT INSERTION Result Date: 12/14/2023 INDICATION: 68 year old female with metastatic breast cancer. She presents for placement of a port catheter to establish durable venous access. EXAM: IMPLANTED PORT A CATH PLACEMENT WITH ULTRASOUND AND FLUOROSCOPIC GUIDANCE MEDICATIONS: None ANESTHESIA/SEDATION: Versed  1 mg IV; Fentanyl  50 mcg IV; administered by the radiology nurse Moderate Sedation Time:  16 minutes The patient's vital signs and level of consciousness were continuously monitored during the procedure by the interventional radiology  nurse under my direct supervision. FLUOROSCOPY: Radiation exposure index: 1 mGy reference air kerma COMPLICATIONS: None immediate. PROCEDURE: The right neck and chest was prepped with chlorhexidine , and draped in the usual sterile fashion using maximum barrier technique (cap and mask, sterile gown, sterile gloves, large sterile sheet, hand hygiene and cutaneous antiseptic). Local anesthesia was attained by infiltration with 1% lidocaine  with epinephrine . Ultrasound demonstrated patency of the right internal jugular vein, and this was documented with an image. Under real-time ultrasound guidance, this vein was accessed with a 21 gauge micropuncture needle and image documentation was performed. A small dermatotomy was made at the access site with an 11 scalpel. A 0.018 wire was advanced into the SVC and the access needle  exchanged for a 71F micropuncture vascular sheath. The 0.018 wire was then removed and a 0.035 wire advanced into the IVC. An appropriate location for the subcutaneous reservoir was selected below the clavicle and an incision was made through the skin and underlying soft tissues. The subcutaneous tissues were then dissected using a combination of blunt and sharp surgical technique and a pocket was formed. A Bard Clear Vue single lumen power injectable portacatheter was then tunneled through the subcutaneous tissues from the pocket to the dermatotomy and the port reservoir placed within the subcutaneous pocket. The venous access site was then serially dilated and a peel away vascular sheath placed over the wire. The wire was removed and the port catheter advanced into position under fluoroscopic guidance. The catheter tip is positioned in the upper right atrium. This was documented with a spot image. The portacatheter was then tested and found to flush and aspirate well. The port was flushed with saline followed by 100 units/mL heparinized saline. The pocket was then closed in two layers using first  subdermal inverted interrupted absorbable sutures followed by a running subcuticular suture. The epidermis was then sealed with Dermabond. The dermatotomy at the venous access site was also closed with Dermabond. IMPRESSION: Successful placement of a right IJ approach Bard Clear Vue port catheter with ultrasound and fluoroscopic guidance. The catheter is ready for use. Electronically Signed   By: Wilkie Lent M.D.   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 Recent biopsy of a right supraclavicular lymph node but additional tissue is needed for diagnostic purposes. EXAM: ULTRASOUND-GUIDED CORE BIOPSY OF RIGHT SUPRACLAVICULAR LYMPH NODE MEDICATIONS: Moderate sedation ANESTHESIA/SEDATION: Moderate (conscious) sedation was employed during this procedure. A total of Versed  1 mg and Fentanyl  50 mcg was administered intravenously by the radiology nurse. Total intra-service moderate Sedation Time: 15 minutes. The patient's level of consciousness and vital signs were monitored continuously by radiology nursing throughout the procedure under my direct supervision. FLUOROSCOPY TIME:  None COMPLICATIONS: None immediate. PROCEDURE: Informed written consent was obtained from the patient after a thorough discussion of the procedural risks, benefits and alternatives. All questions were addressed. A timeout was performed prior to the initiation of the procedure. Ultrasound was used to identify enlarged right supraclavicular lymph nodes. Right supraclavicular area was prepped with chlorhexidine  and sterile field was created. Skin was anesthetized with 1% lidocaine . Small incision was made. Using ultrasound guidance, 17 gauge coaxial needle was directed into a supraclavicular lymph node. Total of 5 core biopsies were obtained with an 18 gauge core device. Specimens placed in formalin. 17 gauge coaxial needle was removed. Bandage placed  over the puncture site. FINDINGS: Multiple enlarged right supraclavicular lymph nodes. Core biopsy needle confirmed within the supraclavicular lymph node. No immediate bleeding or hematoma formation. IMPRESSION: Successful ultrasound-guided core biopsy of an enlarged right supraclavicular lymph node. Electronically Signed   By: Juliene Balder M.D.   On: 11/19/2023 16:16   ECHOCARDIOGRAM LIMITED Result Date: 11/18/2023    ECHOCARDIOGRAM LIMITED REPORT   Patient Name:   VERONIKA HEARD Date of Exam: 11/17/2023 Medical Rec #:  [id number]    Height:       64.0 in Accession #:    [id number]   Weight:       179.9 lb Date of Birth:  11-16-1955     BSA:          1.870 m Patient Age:    68 years  BP:           114/88 mmHg Patient Gender: F            HR:           84 bpm. Exam Location:  [facility] Procedure: 2D Echo, Limited Echo, Limited Color Doppler and Cardiac Doppler            (Both Spectral and Color Flow Doppler were utilized during            procedure). Indications:     Elevated Troponin  History:         Patient has prior history of Echocardiogram examinations, most                  recent 11/11/2023. COPD, Arrythmias:Atrial Fibrillation; Risk                  Factors:Hypertension and Dyslipidemia. Obstructive sleep apnea.                  Fatigue. Breast Cancer.  Sonographer:     Carl Coma RDCS Referring Phys:  [id number] RYAN M DUNN Diagnosing Phys: Lonni Hanson MD IMPRESSIONS  1. Left ventricular ejection fraction, by estimation, is 60 to 65%. The left ventricle has normal function. The left ventricle has no regional wall motion abnormalities. Left ventricular diastolic parameters were normal.  2. Right ventricular systolic function is normal. The right ventricular size is normal. Tricuspid regurgitation signal is inadequate for assessing PA pressure.  3. The mitral valve is degenerative. At least moderate mitral valve regurgitation, which may be underestimated due to jet eccentricity. No evidence of mitral  stenosis.  4. Aortic valve regurgitation is not visualized. No aortic stenosis is present.  5. The inferior vena cava is normal in size with greater than 50% respiratory variability, suggesting right atrial pressure of 3 mmHg. FINDINGS  Left Ventricle: Left ventricular ejection fraction, by estimation, is 60 to 65%. The left ventricle has normal function. The left ventricle has no regional wall motion abnormalities. The left ventricular internal cavity size was normal in size. There is  no left ventricular hypertrophy. Left ventricular diastolic parameters were normal. Right Ventricle: The right ventricular size is normal. No increase in right ventricular wall thickness. Right ventricular systolic function is normal. Tricuspid regurgitation signal is inadequate for assessing PA pressure. Pericardium: There is no evidence of pericardial effusion. Mitral Valve: The mitral valve is degenerative in appearance. There is moderate thickening of the mitral valve leaflet(s). Moderate mitral valve regurgitation, with wall-impinging jet. No evidence of mitral valve stenosis. Tricuspid Valve: The tricuspid valve is normal in structure. Tricuspid valve regurgitation is trivial. Aortic Valve: Aortic valve regurgitation is not visualized. No aortic stenosis is present. Aortic valve mean gradient measures 6.0 mmHg. Aortic valve peak gradient measures 13.5 mmHg. Aorta: The aortic root is normal in size and structure. Venous: The inferior vena cava is normal in size with greater than 50% respiratory variability, suggesting right atrial pressure of 3 mmHg. LEFT VENTRICLE PLAX 2D LVIDd:         5.20 cm Diastology LVIDs:         2.80 cm LV e' medial:    12.35 cm/s LV PW:         0.70 cm LV E/e' medial:  8.9 LV IVS:        0.70 cm LV e' lateral:   15.75 cm/s  LV E/e' lateral: 7.0  RIGHT VENTRICLE             IVC RV S prime:     19.85 cm/s  IVC diam: 1.60 cm LEFT ATRIUM         Index LA diam:    3.70 cm 1.98 cm/m   AORTIC VALVE AV Vmax:      183.59 cm/s AV Vmean:     114.120 cm/s AV VTI:       0.317 m AV Peak Grad: 13.5 mmHg AV Mean Grad: 6.0 mmHg  AORTA Ao Root diam: 3.40 cm MITRAL VALVE MV Area (PHT): 3.63 cm MV VTI:        0.40 m MV Decel Time: 209 msec MV E velocity: 109.67 cm/s MV A velocity: 142.67 cm/s MV E/A ratio:  0.77 Lonni End MD Electronically signed by Lonni Hanson MD Signature Date/Time: 11/18/2023/5:49:00 AM    Final    MR BRAIN WO CONTRAST Result Date: 11/17/2023 CLINICAL DATA:  Provided history: Neuro deficit, acute, stroke suspected. Possible new versus progressed CVA on CTA. Weakness. Aphasia. Facial droop. EXAM: MRI HEAD WITHOUT CONTRAST TECHNIQUE: Multiplanar, multiecho pulse sequences of the brain and surrounding structures were obtained without intravenous contrast. COMPARISON:  Head CT 11/17/2023. CT angiogram head/neck 11/11/2023. Brain MRI 11/11/2023. FINDINGS: Brain: No age-advanced or lobar predominant cerebral atrophy. Numerous small acute infarcts within the left cerebellar hemisphere and left superior cerebellar peduncle, the majority of which are new from the prior MRI of 11/11/2023. Several new small acute cortical infarcts are present within the right frontal, right parietal, left parietal and left occipital lobes. A new punctate acute infarct is also present within the left thalamus. Known patchy acute/early subacute infarcts elsewhere within the left frontal and left parietal lobes (MCA vascular territory). 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 Background mild multifocal T2 FLAIR hyperintense signal abnormality within the cerebral white matter, nonspecific but compatible with chronic small vessel ischemic disease. Punctate chronic microhemorrhage within the left occipital lobe, unchanged (series 10, image 25). No evidence of an intracranial mass. No extra-axial fluid collection. No midline shift. Vascular: Please see the recent prior CTA head/neck  11/11/2023. Small foci of susceptibility-weighted signal loss along the left frontal lobe, new from the prior MRI and suspicious for emboli within distal left MCA branches (series 10, image 46). Skull and upper cervical spine: No focal worrisome marrow lesion. Incompletely assessed cervical spondylosis. Sinuses/Orbits: No mass or acute finding within the imaged orbits. Prior bilateral ocular lens replacement. Minimal mucosal thickening within the right maxillary sinus. IMPRESSION: 1. Numerous small acute infarcts within the left cerebellar hemisphere/superior cerebellar peduncle, the majority of which are new from the prior brain MRI of 11/11/2023. Additionally, there are several new small acute cortical infarcts within the right frontal, right parietal, left parietal and left occipital lobes, as well as a new punctate acute infarct within the left thalamus. Involvement of multiple vascular territories concerning for an embolic process. New small foci of signal abnormality along the left frontal lobe suspicious for emboli within distal left middle cerebral artery branches. 2. Known patchy acute/early subacute infarcts elsewhere within the left frontal and left parietal lobes (MCA vascular territory). 3. Background mild cerebral white matter chronic small vessel ischemic disease. 4. Unchanged chronic lacunar infarct within the right caudate nucleus. Electronically Signed   By: Rockey Childs D.O.   On: 11/17/2023 16:31   DG Chest Portable 1 View Result Date: 11/17/2023 CLINICAL DATA:  Vomiting.  Weakness. EXAM: PORTABLE CHEST  1 VIEW COMPARISON:  11/10/2023 FINDINGS: Bilateral lung fields are clear. Bilateral costophrenic angles are clear. Stable cardio-mediastinal silhouette. No acute osseous abnormalities. The soft tissues are within normal limits. IMPRESSION: No active disease. Electronically Signed   By: Ree Molt M.D.   On: 11/17/2023 11:49   CT Head Wo Contrast Result Date: 11/17/2023 CLINICAL DATA:   68 year old female neurologic deficit with slurred speech, right facial droop. Recent left MCA M2 occlusion, posterior left MCA infarcts, occasional other small scattered brain infarcts on CTA, MRI. EXAM: CT HEAD WITHOUT CONTRAST TECHNIQUE: Contiguous axial images were obtained from the base of the skull through the vertex without intravenous contrast. RADIATION DOSE REDUCTION: This exam was performed according to the departmental dose-optimization program which includes automated exposure control, adjustment of the mA and/or kV according to patient size and/or use of iterative reconstruction technique. COMPARISON:  Brain MRI 11/11/2023 and earlier FINDINGS: Brain: Posterior left MCA infarcts on MRI remain subtle by CT with no associated hemorrhage or mass effect (series 3, image 20). Small left cerebellum PICA territory infarct is now apparent on CT coronal image 52, and appears different from that on the recent MRI. No associated hemorrhage or mass effect. No acute intracranial hemorrhage identified. No midline shift, mass effect, or evidence of intracranial mass lesion. No ventriculomegaly. Vascular: Calcified atherosclerosis at the skull base. No suspicious intracranial vascular hyperdensity. Skull: Stable and intact. Sinuses/Orbits: Visualized paranasal sinuses and mastoids are stable and well aerated. Other: Mild rightward gaze. No other acute orbit or scalp soft tissue finding. IMPRESSION: 1. New or progressed Left cerebellar PICA territory infarct since the MRI on 11/11/2023. 2. Stable CT appearance of posterior left MCA territory infarcts. 3. No associated acute intracranial hemorrhage or mass effect. Electronically Signed   By: VEAR Hurst M.D.   On: 11/17/2023 10:15   US  CORE BIOPSY (LYMPH NODES) Result Date: 11/16/2023 INDICATION: Right lower lobe lung mass, lymphadenopathy and liver lesions. Enlarged right supraclavicular lymph node targeted for biopsy. EXAM: ULTRASOUND GUIDED CORE BIOPSY OF RIGHT  SUPRACLAVICULAR LYMPH NODE MEDICATIONS: None. ANESTHESIA/SEDATION: Moderate (conscious) sedation was employed during this procedure. A total of Versed  1.5 mg and Fentanyl  75 mcg was administered intravenously. Moderate Sedation Time: 19 minutes. The patient's level of consciousness and vital signs were monitored continuously by radiology nursing throughout the procedure under my direct supervision. PROCEDURE: The procedure, risks, benefits, and alternatives were explained to the patient. Questions regarding the procedure were encouraged and answered. The patient understands and consents to the procedure. A time-out was performed prior to initiating the procedure. The right neck was prepped with chlorhexidine  in a sterile fashion, and a sterile drape was applied covering the operative field. A sterile gown and sterile gloves were used for the procedure. Local anesthesia was provided with 1% Lidocaine . After localizing an enlarged right supraclavicular lymph node, 18 gauge core biopsy samples were obtained through different portions of the lymph node. A total of 5 samples were obtained with 3 submitted on saline soaked Telfa gauze and 2 in formalin. Additional ultrasound was performed after biopsy. COMPLICATIONS: None immediate. FINDINGS: Several adjacent right supraclavicular lymph nodes are identified by ultrasound. The largest measures approximately 2.2 x 1.4 x 1.7 cm. Solid core biopsy samples were obtained. IMPRESSION: Ultrasound-guided core biopsy performed of an enlarged right supraclavicular lymph node measuring 2.2 cm in greatest diameter by ultrasound. Electronically Signed   By: Marcey Moan M.D.   On: 11/16/2023 16:02   ECHOCARDIOGRAM COMPLETE Result Date: 11/12/2023    ECHOCARDIOGRAM REPORT  Patient Name:   CINCERE DEPREY Date of Exam: 11/11/2023 Medical Rec #:  [id number]    Height:       64.0 in Accession #:    [id number]   Weight:       180.0 lb Date of Birth:  1955/09/30     BSA:          1.871 m  Patient Age:    83 years     BP:           110/92 mmHg Patient Gender: F            HR:           80 bpm. Exam Location:  [facility] Procedure: 2D Echo, Cardiac Doppler and Color Doppler (Both Spectral and Color            Flow Doppler were utilized during procedure). Indications:     Elevated Troponin  History:         Patient has prior history of Echocardiogram examinations, most                  recent 04/25/2022. COPD, Arrythmias:Atrial Fibrillation; Risk                  Factors:Hypertension and Dyslipidemia. Obstructive sleep apnea.  Sonographer:     Carl Coma RDCS Referring Phys:  [id number] DEVAUGHN SAYRES TNLX Diagnosing Phys: Evalene Lunger MD IMPRESSIONS  1. Left ventricular ejection fraction, by estimation, is 60 to 65%. The left ventricle has normal function. The left ventricle has no regional wall motion abnormalities. There is mild left ventricular hypertrophy. Left ventricular diastolic parameters are consistent with Grade I diastolic dysfunction (impaired relaxation).  2. Right ventricular systolic function is normal. The right ventricular size is normal. There is normal pulmonary artery systolic pressure. The estimated right ventricular systolic pressure is 21.3 mmHg.  3. The mitral valve is normal in structure. Mild to moderate mitral valve regurgitation. No evidence of mitral stenosis.  4. The aortic valve is normal in structure. Aortic valve regurgitation is not visualized. No aortic stenosis is present.  5. The inferior vena cava is normal in size with greater than 50% respiratory variability, suggesting right atrial pressure of 3 mmHg. FINDINGS  Left Ventricle: Left ventricular ejection fraction, by estimation, is 60 to 65%. The left ventricle has normal function. The left ventricle has no regional wall motion abnormalities. Strain was performed and the global longitudinal strain is indeterminate. The left ventricular internal cavity size was normal in size. There is mild left ventricular  hypertrophy. Left ventricular diastolic parameters are consistent with Grade I diastolic dysfunction (impaired relaxation). Right Ventricle: The right ventricular size is normal. No increase in right ventricular wall thickness. Right ventricular systolic function is normal. There is normal pulmonary artery systolic pressure. The tricuspid regurgitant velocity is 2.02 m/s, and  with an assumed right atrial pressure of 5 mmHg, the estimated right ventricular systolic pressure is 21.3 mmHg. Left Atrium: Left atrial size was normal in size. Right Atrium: Right atrial size was normal in size. Pericardium: There is no evidence of pericardial effusion. Mitral Valve: The mitral valve is normal in structure. Mild to moderate mitral valve regurgitation. No evidence of mitral valve stenosis. Tricuspid Valve: The tricuspid valve is normal in structure. Tricuspid valve regurgitation is not demonstrated. No evidence of tricuspid stenosis. Aortic Valve: The aortic valve is normal in structure. Aortic valve regurgitation is not visualized. No aortic stenosis is present. Pulmonic Valve: The pulmonic valve was  normal in structure. Pulmonic valve regurgitation is not visualized. No evidence of pulmonic stenosis. Aorta: The aortic root is normal in size and structure. Venous: The inferior vena cava is normal in size with greater than 50% respiratory variability, suggesting right atrial pressure of 3 mmHg. IAS/Shunts: No atrial level shunt detected by color flow Doppler. Additional Comments: 3D was performed not requiring image post processing on an independent workstation and was indeterminate.  LEFT VENTRICLE PLAX 2D LVIDd:         4.90 cm   Diastology LVIDs:         3.10 cm   LV e' medial:    9.36 cm/s LV PW:         1.00 cm   LV E/e' medial:  9.1 LV IVS:        1.00 cm   LV e' lateral:   9.32 cm/s LVOT diam:     2.00 cm   LV E/e' lateral: 9.2 LV SV:         56 LV SV Index:   30 LVOT Area:     3.14 cm  RIGHT VENTRICLE RV Basal diam:   3.30 cm RV S prime:     18.33 cm/s TAPSE (M-mode): 3.2 cm LEFT ATRIUM             Index        RIGHT ATRIUM           Index LA diam:        3.90 cm 2.08 cm/m   RA Area:     11.60 cm LA Vol (A2C):   50.9 ml 27.21 ml/m  RA Volume:   27.60 ml  14.75 ml/m LA Vol (A4C):   38.2 ml 20.42 ml/m LA Biplane Vol: 44.4 ml 23.73 ml/m  AORTIC VALVE LVOT Vmax:   98.00 cm/s LVOT Vmean:  64.000 cm/s LVOT VTI:    0.179 m  AORTA Ao Root diam: 3.30 cm MITRAL VALVE                TRICUSPID VALVE MV Area (PHT): 3.65 cm     TR Peak grad:   16.3 mmHg MV Decel Time: 208 msec     TR Vmax:        202.00 cm/s MR Peak grad: 82.4 mmHg MR Vmax:      454.00 cm/s   SHUNTS MV E velocity: 85.55 cm/s   Systemic VTI:  0.18 m MV A velocity: 123.50 cm/s  Systemic Diam: 2.00 cm MV E/A ratio:  0.69 Evalene Lunger MD Electronically signed by Evalene Lunger MD Signature Date/Time: 11/12/2023/1:59:01 PM    Final    MR ABDOMEN MRCP W WO CONTAST Result Date: 11/12/2023 CLINICAL DATA:  Evaluate liver lesions.  Chest mass. EXAM: MRI ABDOMEN WITHOUT AND WITH CONTRAST (INCLUDING MRCP) TECHNIQUE: Multiplanar multisequence MR imaging of the abdomen was performed both before and after the administration of intravenous contrast. Heavily T2-weighted images of the biliary and pancreatic ducts were obtained, and three-dimensional MRCP images were rendered by post processing. CONTRAST:  8mL GADAVIST  GADOBUTROL  1 MMOL/ML IV SOLN COMPARISON:  CT AP 11/10/2023 FINDINGS: Lower chest: Small right pleural effusion. Partially visualized mass within the superior segment of right lower lobe noted. Hepatobiliary: There are multifocal bilobar, ill-defined areas of mild increased T2 signal noted within both lobes of liver which show hypoenhancement on the postcontrast images and restricted diffusion. At least 5 lesions are noted involving both lobes. The largest lesion is in segment 5 measuring 1.1 cm,  image 17/8. Lesion in segment 2/3 measures 6 mm, image 10/8. Status post  cholecystectomy. Mild intrahepatic bile duct dilatation. Fusiform dilatation of the common bile duct which measures 1.3 cm proximally. No signs of choledocholithiasis or mass. Pancreas:  No main duct dilatation, inflammation or mass identified. Spleen: Within the proximal portion of the spleen there are 2 wedge-shaped areas of peripheral hypoenhancement, image 25/31. Adrenals/Urinary Tract: There is a 1.3 cm nodule in the left adrenal gland which is indeterminate with only mild loss of signal on the out of phase sequences, image 29/9. This is a new finding when compared with the remote CT of the abdomen pelvis from 09/17/2020. Normal right adrenal gland. No kidney mass or signs of obstructive uropathy. Stomach/Bowel: Visualized portions within the abdomen are unremarkable. Vascular/Lymphatic: No aneurysm. Aortic atherosclerosis. No adenopathy. Other:  No ascites. Musculoskeletal: Multifocal abnormal areas of enhancement are identified throughout the visualized portions of the thoracic and lumbar spine which are concerning for osseous metastasis. IMPRESSION: 1. There are multifocal bilobar, ill-defined areas of mild increased T2 signal noted within both lobes of liver which show hypoenhancement on the postcontrast images and restricted diffusion. At least 5 lesions are noted involving both lobes. 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 2. Multifocal abnormal areas of enhancement are identified throughout the visualized portions of the thoracic and lumbar spine which are concerning for osseous metastasis. 3. There are 2 wedge-shaped areas of peripheral hypoenhancement within the proximal portion of the spleen which are concerning for splenic infarcts. 4. There is a 1.3 cm nodule in the left adrenal gland which is indeterminate with only mild loss of signal on the out of phase sequences. This is a new finding when compared with the remote CT of the abdomen pelvis from 09/17/2020. Differential considerations  include lipid poor adenoma versus a adrenal metastasis. 5. Small right pleural effusion. 6. Partially visualized mass within the superior segment of right lower lobe. 7. Status post cholecystectomy. Mild intrahepatic bile duct dilatation. Fusiform dilatation of the common bile duct which measures 1.3 cm proximally. No signs of choledocholithiasis or mass. Electronically Signed   By: Waddell Calk M.D.   On: 11/12/2023 05:07   MR 3D Recon At Scanner Result Date: 11/12/2023 CLINICAL DATA:  Evaluate liver lesions.  Chest mass. EXAM: MRI ABDOMEN WITHOUT AND WITH CONTRAST (INCLUDING MRCP) TECHNIQUE: Multiplanar multisequence MR imaging of the abdomen was performed both before and after the administration of intravenous contrast. Heavily T2-weighted images of the biliary and pancreatic ducts were obtained, and three-dimensional MRCP images were rendered by post processing. CONTRAST:  8mL GADAVIST  GADOBUTROL  1 MMOL/ML IV SOLN COMPARISON:  CT AP 11/10/2023 FINDINGS: Lower chest: Small right pleural effusion. Partially visualized mass within the superior segment of right lower lobe noted. Hepatobiliary: There are multifocal bilobar, ill-defined areas of mild increased T2 signal noted within both lobes of liver which show hypoenhancement on the postcontrast images and restricted diffusion. At least 5 lesions are noted involving both lobes. The largest lesion is in segment 5 measuring 1.1 cm, image 17/8. Lesion in segment 2/3 measures 6 mm, image 10/8. Status post cholecystectomy. Mild intrahepatic bile duct dilatation. Fusiform dilatation of the common bile duct which measures 1.3 cm proximally. No signs of choledocholithiasis or mass. Pancreas:  No main duct dilatation, inflammation or mass identified. Spleen: Within the proximal portion of the spleen there are 2 wedge-shaped areas of peripheral hypoenhancement, image 25/31. Adrenals/Urinary Tract: There is a 1.3 cm nodule in the left adrenal gland which is indeterminate  with only mild loss of signal on the out of phase sequences, image 29/9. This is a new finding when compared with the remote CT of the abdomen pelvis from 09/17/2020. Normal right adrenal gland. No kidney mass or signs of obstructive uropathy. Stomach/Bowel: Visualized portions within the abdomen are unremarkable. Vascular/Lymphatic: No aneurysm. Aortic atherosclerosis. No adenopathy. Other:  No ascites. Musculoskeletal: Multifocal abnormal areas of enhancement are identified throughout the visualized portions of the thoracic and lumbar spine which are concerning for osseous metastasis. IMPRESSION: 1. There are multifocal bilobar, ill-defined areas of mild increased T2 signal noted within both lobes of liver which show hypoenhancement on the postcontrast images and restricted diffusion. At least 5 lesions are noted involving both lobes. 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 2. Multifocal abnormal areas of enhancement are identified throughout the visualized portions of the thoracic and lumbar spine which are concerning for osseous metastasis. 3. There are 2 wedge-shaped areas of peripheral hypoenhancement within the proximal portion of the spleen which are concerning for splenic infarcts. 4. There is a 1.3 cm nodule in the left adrenal gland which is indeterminate with only mild loss of signal on the out of phase sequences. This is a new finding when compared with the remote CT of the abdomen pelvis from 09/17/2020. Differential considerations include lipid poor adenoma versus a adrenal metastasis. 5. Small right pleural effusion. 6. Partially visualized mass within the superior segment of right lower lobe. 7. Status post cholecystectomy. Mild intrahepatic bile duct dilatation. Fusiform dilatation of the common bile duct which measures 1.3 cm proximally. No signs of choledocholithiasis or mass. Electronically Signed   By: Waddell Calk M.D.   On: 11/12/2023 05:07   CT ANGIO HEAD NECK W WO CM Addendum  Date: 11/11/2023 ADDENDUM REPORT: 11/11/2023 23:37 ADDENDUM: Findings discussed with Brenda Morisson, NP via telephone at 10:33 p.m. Electronically Signed   By: Gilmore GORMAN Molt M.D.   On: 11/11/2023 23:37   Result Date: 11/11/2023 CLINICAL DATA:  Stroke/TIA, determine embolic source EXAM: CT ANGIOGRAPHY HEAD AND NECK WITH AND WITHOUT CONTRAST TECHNIQUE: Multidetector CT imaging of the head and neck was performed using the standard protocol during bolus administration of intravenous contrast. Multiplanar CT image reconstructions and MIPs were obtained to evaluate the vascular anatomy. Carotid stenosis measurements (when applicable) are obtained utilizing NASCET criteria, using the distal internal carotid diameter as the denominator. RADIATION DOSE REDUCTION: This exam was performed according to the departmental dose-optimization program which includes automated exposure control, adjustment of the mA and/or kV according to patient size and/or use of iterative reconstruction technique. CONTRAST:  75mL OMNIPAQUE  IOHEXOL  350 MG/ML SOLN COMPARISON:  Same day MRI head. FINDINGS: CTA NECK FINDINGS Aortic arch: Aortic atherosclerosis. 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 Great vessel origins are patent. Right carotid system: Atherosclerosis at the carotid bifurcation without greater than 50% stenosis. Left carotid system: Atherosclerosis at the carotid bifurcation without greater than 50% stenosis. Vertebral arteries: Codominant. No evidence of dissection, stenosis (50% or greater), or occlusion. Skeleton: No evidence of acute abnormality on limited assessment. Multilevel degenerative change. Other neck: Approximately 2.3 cm left thyroid  nodule. Upper chest: Please see same day CT chest for intrathoracic findings. Emphysema. Review of the MIP images confirms the above findings CTA HEAD FINDINGS Anterior circulation: Bilateral intracranial ICAs are patent. Approximately 2 mm  medially directed outpouching arising from the right cavernous ICA, which could represent an infundibulum or aneurysm. Bilateral M1 MCAs are patent. 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 Bilateral ACAs are patent without proximal hemodynamically significant stenosis. Posterior circulation: Bilateral intradural vertebral arteries, basilar artery and bilateral posterior cerebral arteries are patent without proximal hemodynamically significant stenosis. Venous sinuses: As permitted by contrast timing, patent. Review of the MIP images confirms the above findings The ordering provider has been paged at the time of dictation for call of report. IMPRESSION: 1. 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 2. Approximately 2 mm medially directed outpouching arising from the right cavernous ICA, which could represent an infundibulum or aneurysm. 3. Please see same day CT chest for intrathoracic findings. 4. Approximately 2.3 cm left thyroid  nodule. Recommend thyroid  US  (ref: J Am Coll Radiol. 2015 Feb;12(2): 143-50). 5. Aortic Atherosclerosis (ICD10-I70.0) and Emphysema (ICD10-J43.9). Electronically Signed: By: Gilmore GORMAN Molt M.D. On: 11/11/2023 23:14   MR BRAIN W WO CONTRAST Result Date: 11/11/2023 CLINICAL DATA:  right facial weakness, malignancy also on the ddx EXAM: MRI HEAD WITHOUT AND WITH CONTRAST TECHNIQUE: Multiplanar, multiecho pulse sequences of the brain and surrounding structures were obtained without and with intravenous contrast. CONTRAST:  8mL GADAVIST  GADOBUTROL  1 MMOL/ML IV SOLN COMPARISON:  CT head November 11, 2023. 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 Mild edema without mass effect. No midline shift. Probable additional punctate acute infarct in the inferior left cerebellum. No evidence of acute hemorrhage, mass lesion, or hydrocephalus. Vascular: Major arterial flow  voids are maintained at the skull base. Skull and upper cervical spine: Normal marrow signal. Sinuses/Orbits: Clear sinuses.  No acute orbital findings. IMPRESSION: 1. 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 Probable additional punctate acute infarct in the inferior left cerebellum. These results will be called to the ordering clinician or representative by the Radiologist Assistant, and communication documented in the PACS or [employer]. Electronically Signed   By: Gilmore GORMAN Molt M.D.   On: 11/11/2023 20:27   CT CHEST W CONTRAST Result Date: 11/11/2023 CLINICAL DATA:  Liver lesions with history of breast cancer. EXAM: CT CHEST WITH CONTRAST TECHNIQUE: Multidetector CT imaging of the chest was performed during intravenous contrast administration. RADIATION DOSE REDUCTION: This exam was performed according to the departmental dose-optimization program which includes automated exposure control, adjustment of the mA and/or kV according to patient size and/or use of iterative reconstruction technique. CONTRAST:  75mL OMNIPAQUE  IOHEXOL  300 MG/ML  SOLN COMPARISON:  CT abdomen and pelvis 11/10/2023. FINDINGS: Cardiovascular: Heart is borderline enlarged/mildly enlarged. Aorta is normal in size. There is no pericardial effusion. There are atherosclerotic calcifications of the aorta. Mediastinum/Nodes: There is a heterogeneous left thyroid  nodule attaining cystic and solid components measuring 2.6 x 1.5 cm. There are multiple enlarged right paratracheal lymph nodes measuring up to 2.0 x 2.9 cm. Enlarged subcarinal lymph node measures 1.2 cm short axis. There is a prominent right lower esophageal lymph node measuring 8 mm short axis. There are multiple enlarged right hilar lymph nodes measuring up to 1.4 x 1.8 cm. Visualized esophagus is within normal limits. Lungs/Pleura: Mild emphysematous changes are present. There is scarring in both  lung apices. Multifocal airspace disease is seen throughout the right lower lobe, predominantly posteriorly and peripherally. Some areas are slightly nodular for example superior segment measuring 3.2 by 2.0 cm. A small air-fluid level seen in this region. No other air-fluid levels are seen. Additional nodular density seen in the right lower lobe image 5/92 measuring 9 mm.  Smaller nodules are seen scattered throughout the right lower lobe measuring 5 mm or less. There is a trace right pleural effusion. There is a right middle lobe nodule measuring 5 mm image 5/122. There are prominent intrapulmonary lymph nodes along the right major fissure. There are 2 mm nodular densities in the left upper lobe. Left lung is otherwise clear. Upper Abdomen: Subcentimeter hypodensities in the liver and left adrenal mass are unchanged. Please see CT abdomen and pelvis performed same day for further description. Musculoskeletal: There is anterior intramuscular chest wall edema on the right. Focal density is seen in the medial right breast extending to the skin surface measuring 1.2 x 2.3 cm image 3/81. There some associated diffuse right breast skin thickening. No acute fracture. There is a single 5 mm sclerotic density in the T3 vertebral body. IMPRESSION: 1. Multifocal airspace disease throughout the right lower lobe, predominantly posteriorly and peripherally. Single area is slightly nodular in the superior segment containing an air-fluid level worrisome for necrotic pneumonia or necrotic nodule. Findings may be infectious/inflammatory, but neoplasm not excluded. 2. Trace right pleural effusion. 3. Mediastinal and right hilar lymphadenopathy. 4. Additional right lower lobe and right middle lobe pulmonary nodules measuring 5 mm. 5. Left thyroid  nodule measuring 2.6 cm. Recommend further evaluation with ultrasound. 6. Focal density in the medial right breast extending to the skin surface measuring 1.2 x 2.3 cm. Correlate with  physical exam. 7. Single 5 mm sclerotic density in the T3 vertebral body. Aortic Atherosclerosis (ICD10-I70.0) and Emphysema (ICD10-J43.9). Electronically Signed   By: Greig Pique M.D.   On: 11/11/2023 17:53   CT HEAD WO CONTRAST ( ) Result Date: 11/11/2023 CLINICAL DATA:  Right facial weakness. EXAM: CT HEAD WITHOUT CONTRAST TECHNIQUE: Contiguous axial images were obtained from the base of the skull through the vertex without intravenous contrast. RADIATION DOSE REDUCTION: This exam was performed according to the departmental dose-optimization program which includes automated exposure control, adjustment of the mA and/or kV according to patient size and/or use of iterative reconstruction technique. COMPARISON:  Head CT 07/21/2022 FINDINGS: Brain: There are new small hypodensities in the left frontoparietal white matter at the level of the posterior centrum semiovale (series 3, image 20), and there is a new subcentimeter focus of cortical hypodensity at the level of the left parietal operculum (series 6, image 46), suspicious for recent infarcts. No intracranial hemorrhage, mass, midline shift, or extra-axial fluid collection is identified. Cerebral volume is normal. The ventricles are normal in size. Vascular: No hyperdense vessel. Skull: No acute fracture or suspicious lesion. Sinuses/Orbits: Visualized paranasal sinuses and mastoid air cells are clear. Bilateral cataract extraction. Other: None. 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 A brain MRI is in progress and will provide further evaluation. Electronically Signed   By: Dasie Hamburg M.D.   On: 11/11/2023 15:13   DG Chest 2 View Result Date: 11/10/2023 CLINICAL DATA:  dyspnea EXAM: CHEST - 2 VIEW COMPARISON:  Chest x-ray 07/21/2022, chest x-ray 02/15/2021 FINDINGS: The heart and mediastinal contours are unchanged. Prominent hilar vasculature. No focal consolidation. No pulmonary edema. No pleural  effusion. No pneumothorax. No acute osseous abnormality. IMPRESSION: Prominent hilar vasculature suggestive of pulmonary venous congestion. Underlying lymph nodes not excluded. Electronically Signed   By: Morgane  Naveau M.D.   On: 11/10/2023 23:50   CT ABDOMEN PELVIS W CONTRAST Result Date: 11/10/2023 CLINICAL DATA:  Acute nonlocalized abdominal pain. Vomiting and diarrhea. EXAM: CT ABDOMEN AND PELVIS WITH CONTRAST TECHNIQUE: Multidetector CT  imaging of the abdomen and pelvis was performed using the standard protocol following bolus administration of intravenous contrast. RADIATION DOSE REDUCTION: This exam was performed according to the departmental dose-optimization program which includes automated exposure control, adjustment of the mA and/or kV according to patient size and/or use of iterative reconstruction technique. CONTRAST:  OMNIPAQUE  IOHEXOL  300 MG/ML  SOLN COMPARISON:  None Available. FINDINGS: Lower chest: Focal consolidation suggested in the right lung base with small right pleural effusion. This may represent pneumonia or less likely aspiration. Emphysematous changes in the lungs. Hepatobiliary: Multiple scattered low-attenuation lesions in the liver, most are subcentimeter in size. Largest is in the medial segment left lobe measuring 1.3 cm in diameter. Enhancement pattern is indeterminate. Consider follow-up MRI for further characterization. The gallbladder is not visualized, likely surgically absent. Mild intrahepatic bile duct dilatation is most likely due to postoperative change. No common duct stones are identified. Pancreas: Unremarkable. No pancreatic ductal dilatation or surrounding inflammatory changes. Spleen: Segmental low-attenuation in the upper spleen likely represents a splenic infarct. Spleen size is normal. Adrenals/Urinary Tract: 1.3 cm diameter left adrenal gland nodule with density measurement of 69 Hounsfield units. Renal nephrograms are symmetrical. No solid mass  identified. No hydronephrosis or hydroureter. Bladder is normal. Stomach/Bowel: Stomach is within normal limits. Appendix appears normal. No evidence of bowel wall thickening, distention, or inflammatory changes. Vascular/Lymphatic: Aortic atherosclerosis. No enlarged abdominal or pelvic lymph nodes. Reproductive: Uterus and bilateral adnexa are unremarkable. Other: No abdominal wall hernia or abnormality. No abdominopelvic ascites. Musculoskeletal: Postoperative fixation of the right hip. No acute bony abnormalities. IMPRESSION: 1. Nonspecific low-attenuation lesions in the liver, largest measuring 1.3 cm diameter. Enhancement pattern is indeterminate. Consider follow-up MRI for further characterization. 2. Mild intrahepatic bile duct dilatation is likely postoperative in the setting of cholecystectomy. 3. Left adrenal mass measuring 1.3 cm, probable benign adenoma. Recommend follow-up adrenal washout CT in 1 year. If stable for = 1 year, no further follow-up imaging. JACR 2017 Aug; 14(8):1038-44, JCAT 2016 Mar-Apr; 40(2):194-200, Urol J 2006 Spring; 3(2):71-4. 4. Aortic atherosclerosis. 5. Focal area of consolidation suggested in the right lung base with small right pleural effusion, likely pneumonia. Electronically Signed   By: Elsie Gravely M.D.   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  Additional imaging workup, tissue of origin suggest lung origin. Her clinical status has decreased and has missed multiple treatment appointments. # History of recurrent CVA on Lovenox  for anticoagulation.  Lovenox  currently held since yesterday for plan of LP. Patient has hypercoagulable state due to metastatic cancer. # Altered mental status, neurology recommendation reviewed.  Recommending LP  Goals of care discussion  Had a lengthy  discussion with patient's husband at the bedside.  Patient's prognosis is extremely poor, clinically she has declined rapidly.  She is not a candidate for additional chemotherapy treatments.  She is not able to finish her palliative radiation sessions prior to admission. Discussed with patient's husband about option of proceeding comfort care/hospice.  He is leaning toward making patient comfortable and would like to further discuss with patient's son.  Husband decides to forego lumbar puncture.   Thank you for allowing me to participate in the care of this patient.   Zelphia Cap, MD, PhD Hematology Oncology 02/08/2024

## 2024-02-08 NOTE — Assessment & Plan Note (Signed)
 EMS documented sats in the 80s on 2 L and was increased to 6 L by EMS.  Chronically on 2 L.

## 2024-02-08 NOTE — Progress Notes (Signed)
 Progress Note   Patient: Debra [name] FMW:[id number] DOB: 1955-11-12 DOA: 02/04/2024     3 DOS: the patient was seen and examined on 02/08/2024   Brief hospital course: 68 y.o. female with medical history significant for HTN, metastatic breast cancer to lung bone and liver, history of prior CVA, chronic pain secondary to malignancy, paroxysmal A-fib on lovenox ,  COPD chronically on 2 L recently admitted from 6/11 - 01/29/24 for CAP/postobstructive pneumonia, being readmitted for ongoing symptoms of pneumonia with increased O2 requirement to 6 L.  Since her discharge, she has felt increasingly weak along with shortness of breath.  Has a continued cough.  Denies fever or chest pain. With EMS, O2 sats in the high 80s to low 90s on 2 L.  Upon arrival she was titrated to up to 6 L.  Tachycardic to the 120s, afebrile, BP 122/37.  Labs notable for WBC 22,000 up from 9.8 at discharge 6 days ago.  Hemoglobin at baseline at 9.6.  Lactic acid 1.7.  INR and CMP unremarkable.  Chest x-ray showed increased filling severity of the right basilar atelectasis/infiltrate compared to prior study as well as increased size moderate right sided pleural effusion. Patient started on aztreonam  and vancomycin .  Admission requested   6/20.  With MRSA PCR being negative vancomycin  discontinued and added doxycycline .  Patient on Maxipime .  Thoracentesis removed 1.1 L of fluid. 6/21.  Patient nauseous with vomiting this morning.  Having some back pain.  White blood cell count down to 15.1.  Hemoglobin 8.1. 6/22.  Called by nursing staff to see patient secondary to altered mental status.  Patient was able to hold her arms up in the chair but drifting.  Patient difficult to understand.  Case discussed with neurology and stat CTA and CT venogram were negative for bleed or stroke but did show metastatic disease with lytic lesions on the bone and a C6 fracture pathological fracture.  MRI cervical spine and head with and without contrast  ordered.  Assessment and Plan: * Acute metabolic encephalopathy Mental status much different 6/22 than prior.  Patient not eating or wanting to take medication.  Stat CT a of the head and neck and CT venogram were negative for stroke or bleed.  Did show lytic lesions of the bone consistent with metastatic disease and a C6 fracture pathologic.  No large vessel occlusion 2 mm cavernous ICA aneurysm.  MRI of the brain and cervical spine with and without contrast ordered.  MRI department along did without contrast.  We tried to order an MRI with contrast but unable to be done because she is unable to hold still for that.  Lumbar puncture also ordered but unable to be done secondary to patient moving around.  Patient placed on empiric antibiotics for meningitis including acyclovir, ampicillin , Rocephin  and vancomycin .  Recurrent pneumonia Failed outpatient treatment.  Started on Maxipime  and added doxycycline  on admission.  Vancomycin  discontinued secondary to MRSA PCR being negative.  With 2 out of 4 blood cultures positive for Staphylococcus epidermidis, this still could be a contamination but with her history, I will be more aggressive with antibiotics and restart vancomycin  (on 6/21) and discontinue doxycycline .  So far repeat blood cultures are negative.  Antibiotics changed on the afternoon on 6/22 to Rocephin , ampicillin  and vancomycin .  Pleural effusion, right Thoracentesis removed 1.1 L.  Could be secondary to history of cancer or pneumonia.  So far no growth in culture.  Cytology pending  History of CVA (cerebrovascular accident) Holding Lovenox   injections for possible lumbar puncture.  Paroxysmal atrial fibrillation (HCC) Holding Lovenox  injections for possible lumbar puncture.  Patient went into rapid atrial fibrillation last night requiring fluid bolus and medication.  Acute on chronic respiratory failure with hypoxia (HCC) EMS documented sats in the 80s on 2 L and was increased to 6 L by  EMS.  Chronically on 2 L.  Nausea As needed Zofran  and also as needed Phenergan  for breakthrough nausea.  Anemia of chronic disease Hemoglobin down to 7.6.  Continue to monitor.  May end up needing a blood transfusion.  Breast cancer metastasized to multiple sites [facility]) Appreciate palliative care team following.  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   CT scan showing metastases lytic lesions to the bone and also C6 cervical spine.  Concern for leptomeningeal spread with her altered mental status.  Chronic pain On MS Contin  and oxycodone .  As needed IV morphine .  Chronic constipation Patient had documented bowel movement today        Subjective: Patient is where he was able to answer few simple questions.  Still did not want to eat.  Unable to tolerate MRI with contrast.  Lumbar puncture was unable to be performed today because they were concerned about her moving around.  Initially admitted with worsening postobstructive pneumonia and right pleural effusion.  Physical Exam: Vitals:   02/08/24 0003 02/08/24 0541 02/08/24 0920 02/08/24 1354  BP: (!) 141/71 133/61 133/76 124/69  Pulse: (!) 108  (!) 103 (!) 108  Resp: 18 18 16    Temp:  98 F (36.7 C)    TempSrc:      SpO2: 100% 100% 100% 99%  Weight:      Height:       Physical Exam HENT:     Head: Normocephalic.   Eyes:     General: Lids are normal.    Cardiovascular:     Rate and Rhythm: Regular rhythm. Tachycardia present.     Heart sounds: Normal heart sounds, S1 normal and S2 normal.  Pulmonary:     Breath sounds: Examination of the right-lower field reveals decreased breath sounds and rhonchi. Decreased breath sounds and rhonchi present. No wheezing or rales.  Abdominal:     Palpations: Abdomen is soft.     Tenderness: There is abdominal tenderness in the left lower quadrant.   Musculoskeletal:     Left hip: Bony tenderness present.     Right lower leg: No swelling.     Left lower leg: No swelling.   Skin:     General: Skin is warm.     Findings: No rash.   Neurological:     Mental Status: She is lethargic.     Comments: Patient answered a few simple questions.    Data Reviewed: Creatinine 0.63, potassium 3.4, white blood cell count 15.5, hemoglobin 7.6, platelet count 270  Family Communication: Spoke with husband at the bedside twice  Disposition: Status is: Inpatient Remains inpatient appropriate because: Palliative care and oncology to talk with patient and husband about goals of care.  Initially husband decided to go with doing a lumbar puncture but now we will hold off.  Will confirm this with husband tomorrow before canceling test altogether.  Planned Discharge Destination: To be determined depending on clinical course.  Patient would be a candidate for hospice facility    Time spent: 40 minutes Case discussed with interventional radiology team, palliative care, oncology, nursing staff and neurology.  Author: Charlie Patterson, MD 02/08/2024 5:01 PM  For  on call review [email or web address].

## 2024-02-08 NOTE — Progress Notes (Signed)
 Husband is going to proceed with hospice tomorrow.  Will continue IV fluid and antibiotic today.  Cancel lumbar puncture and MRIs.  Dr. Charlie Patterson

## 2024-02-08 NOTE — Progress Notes (Signed)
 Eeg done

## 2024-02-08 NOTE — Progress Notes (Signed)
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 stir/ AX T1/ medic of  c-spine. FINDINGS: BRAIN AND VENTRICLES: Expected evolution of previously seen acute infarcts in the posterior left corona radiata, left frontal, and left parietal lobe are noted. The study is moderately degraded by patient motion. No intracranial hemorrhage. No mass. No midline shift. No hydrocephalus. The sella is unremarkable. Normal flow voids. ORBITS: Bilateral lens replacements are noted. The globes and orbits are otherwise within normal limits. SINUSES AND MASTOIDS: No acute abnormality. BONES AND SOFT TISSUES: Multiple foci of T1 signal loss within the marrow-containing spaces are consistent with diffuse metastatic disease. No lesion at C2 is increasing in size. A 7 mm lesion within the clivus is new. No pathologic fractures are present. Scattered metastases are present throughout the skull. IMPRESSION: 1. Expected evolution of previously seen acute infarcts in the posterior left corona radiata, left frontal, and left parietal  lobe. 2. Diffuse metastatic disease involving the marrow-containing spaces, with a new 7 mm lesion within the clivus and scattered metastases throughout the skull. No pathologic fractures. Electronically signed by: Lonni Necessary MD 02/07/2024 12:24 PM EDT RP Workstation: [id number]   CT VENOGRAM HEAD Result Date: 02/07/2024 EXAM: CT VENOGRAM WITH CONTAST 02/07/2024 09:30:19 AM TECHNIQUE: CT venogram of the head/brain was performed with the administration of 75mL iohexol  (OMNIPAQUE ) 350 MG/ML injection. Multiplanar reformatted images are provided for review. MIP images are provided for review. Automated exposure control, iterative reconstruction, and/or weight based adjustment of the mA/kV was utilized to reduce the radiation dose to as low as reasonably achievable. COMPARISON: MRI head without contrast dated 09/12/2023. CT angiography of the head and neck dated 11/11/2023. CLINICAL HISTORY: Hypercoaguable, altered mental status. Neuro deficit, acute, stroke suspected. FINDINGS: Dominant right transverse sinus is present. Hypoplastic left transverse and sigmoid sinuses is noted. No dural venous sinus thrombosis. No significant stenosis. IMPRESSION: 1. No dural venous sinus thrombosis. 2. Dominant right transverse sinus and hypoplastic left transverse and sigmoid sinuses. Electronically signed by: Lonni Necessary MD 02/07/2024 10:02 AM EDT RP Workstation: [id number]   CT ANGIO HEAD NECK W WO CM Result Date: 02/07/2024 EXAM: CTA HEAD AND NECK WITH AND WITHOUT 02/07/2024 09:30:19 AM TECHNIQUE: CTA of the head and neck was performed with and without the administration of intravenous contrast. Multiplanar 2D and/or 3D reformatted images are provided for review. Automated exposure control, iterative reconstruction, and/or weight based adjustment of the mA/kV was utilized to reduce the radiation dose to as low as reasonably achievable. Stenosis of the internal carotid arteries measured using NASCET criteria.  COMPARISON: MR head without contrast 11/17/2023. CT head without contrast 11/17/2023. CT of the head and neck 11/11/2023. CLINICAL HISTORY: Neuro deficit, acute, stroke suspected. Breast cancer with metastatic disease. FINDINGS: CTA NECK: AORTIC ARCH AND ARCH VESSELS: Atherosclerotic calcifications at the aortic arch are stable. The great vessel origins are widely patent. CERVICAL CAROTID ARTERIES: Mild atherosclerotic changes are present at the origin of the right common carotid artery without focal stenosis. Proximal right ICA without significant stenosis. Atherosclerotic changes are present in the distal left common carotid artery and the carotid bifurcation without focal stenosis. CERVICAL VERTEBRAL ARTERIES: No dissection, arterial injury, or significant stenosis. LUNGS AND MEDIASTINUM: Unremarkable. SOFT TISSUES: A thyroid  nodule is again noted. BONES: Multilevel degenerative changes are again noted within the cervical spine. Interval pathologic fracture is noted along the superior endplate of C6. Multiple small lytic lesions are present throughout the visualized skeleton compatible with widespread metastatic disease. No other fractures are present. CTA HEAD: ANTERIOR CIRCULATION: Atherosclerotic calcifications are present within the cavernous internal carotid arteries bilaterally without focal  stenosis. 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 POSTERIOR CIRCULATION: No significant stenosis of the posterior cerebral arteries. No significant stenosis of the basilar artery. No significant stenosis of the vertebral arteries. No aneurysm. OTHER: No dural venous sinus thrombosis on this non-dedicated study. Periventricular white matter disease is again noted. 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 No acute infarct or hemorrhage is present. No mass lesion is present. Chronic inferior right mastoid effusion is present. IMPRESSION: 1. Multiple small lytic  lesions throughout the visualized skeleton compatible with widespread metastatic disease. 2. Interval pathologic fracture along the superior endplate of C6. 3. No large vessel occlusion or hemodynamically significant stenosis in the head or neck. 4. Stable atherosclerotic calcifications at the aortic arch and cavernous internal carotid arteries bilaterally without focal stenosis. 5. 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 Electronically signed by: Lonni Necessary MD 02/07/2024 09:58 AM EDT RP Workstation: [id number]   DG Chest Port 1 View Result Date: 02/05/2024 CLINICAL DATA:  Pleural effusion. History of metastatic breast cancer EXAM: PORTABLE CHEST 1 VIEW COMPARISON:  X-ray 02/04/2024 FINDINGS: Right IJ chest port in place with tip along the central SVC right atrial junction. Normal cardiopericardial silhouette. Calcified aorta. Trace right-sided pleural effusion identified. Significantly decreased from previous. Prior thoracentesis. No pneumothorax. Mild right lung base opacity. No edema. No left-sided consolidation. Overlapping cardiac leads. IMPRESSION: Significantly improved right pleural effusion with trace residual. Mild right lung base opacity. No pneumothorax seen. Electronically Signed   By: Ranell Bring M.D.   On: 02/05/2024 14:55   US  THORACENTESIS ASP PLEURAL SPACE W/IMG GUIDE Result Date: 02/05/2024 INDICATION: 68 year old female with a history of lung cancer, currently with pneumonia, and right-sided pleural effusion. IR was requested for diagnostic and therapeutic right sided thoracentesis. EXAM: ULTRASOUND GUIDED DIAGNOSTIC AND THERAPEUTIC THORACENTESIS MEDICATIONS: 8 cc of 1% lidocaine  COMPLICATIONS: None immediate. PROCEDURE: An ultrasound guided thoracentesis was thoroughly discussed with the patient and questions answered. The benefits, risks, alternatives and complications were also discussed. The patient understands and wishes to proceed with the  procedure. Written consent was obtained. Ultrasound was performed to localize and mark an adequate pocket of fluid in the right chest. The area was then prepped and draped in the normal sterile fashion. 1% Lidocaine  was used for local anesthesia. Under ultrasound guidance a 6 Fr Safe-T-Centesis catheter was introduced. Thoracentesis was performed. The catheter was removed and a dressing applied. FINDINGS: A total of approximately 1.1 L of clear, straw-colored pleural fluid was removed. Samples were sent to the laboratory as requested by the clinical team. IMPRESSION: Successful ultrasound guided right thoracentesis yielding 1.1 L of pleural fluid. Procedure performed by Carlin Griffon, PA-C Electronically Signed   By: Marcey Moan M.D.   On: 02/05/2024 12:15   DG Chest 1 View Result Date: 02/04/2024 CLINICAL DATA:  Weakness and shortness of breath. EXAM: CHEST  1 VIEW COMPARISON:  January 27, 2024 FINDINGS: There is stable right-sided venous Port-A-Cath positioning. The heart size and mediastinal contours are within normal limits. There is marked severity right basilar atelectasis and/or infiltrate which is increased in severity when compared to the prior study. Mild to moderate severity similar appearing changes are seen within the mid right lung. A moderate size right-sided pleural effusion is also noted. This is also increased in size. No pneumothorax is identified. Multilevel degenerative changes are seen throughout the thoracic spine. IMPRESSION: 1. Marked severity right basilar atelectasis and/or infiltrate, increased in severity  when compared to the prior study. 2. Moderate sized right-sided pleural effusion, also increased in size. Electronically Signed   By: Suzen Dials M.D.   On: 02/04/2024 18:35   DG Chest Port 1 View Result Date: 01/27/2024 CLINICAL DATA:  Sepsis EXAM: PORTABLE CHEST 1 VIEW COMPARISON:  November 17, 2023 FINDINGS: Right IJ Infusaport catheter in good position. Right lower lobe  consolidative pulmonary infiltrates and atelectasis with a small effusion could correlate with pneumonia. IMPRESSION: Right lower lobe pneumonia. Electronically Signed   By: Franky Chard M.D.   On: 01/27/2024 10:51    PERFORMANCE STATUS (ECOG) : 4 - Bedbound  Review of Systems Unable to complete  Physical Exam General: Ill-appearing Pulmonary: On O2, unlabored Extremities: no edema, no joint deformities Skin: no rashes Neurological: Weakness, lethargy  IMPRESSION: Weekend notes reviewed.  Patient developed worsening altered mental status with unclear etiology.  Workup including MRI brain and EEG were unrevealing.  Patient pending LP.  Currently, patient is lethargic.  Able to tell me her name but nothing more detailed.  No family at bedside.  I called and spoke with her son.  He says that he is seen patient significantly declined and fears that she is rapidly nearing end-of-life.  He tells me that he does not see a viable path for patient to pursue cancer treatment.  We discussed hospice involvement either at home or an inpatient hospice facility.  He says that he intends to speak with his stepdad about decision making.   PLAN: -Continue current scope of treatment -Family considering decision making  Case and plan discussed with Dr. Babara.    Time Total: 25 minutes  Visit consisted of counseling and education dealing with the complex and emotionally intense issues of symptom management and palliative care in the setting of serious and potentially life-threatening illness.Greater than 50%  of this time was spent counseling and coordinating care related to the above assessment and plan.  Signed by: Fonda Mower, PhD, NP-C

## 2024-02-08 NOTE — Care Management Important Message (Signed)
 Important Message  Patient Details  Name: IMONIE TUCH MRN: [id number] Date of Birth: 18-Jan-1956   Important Message Given:  Yes - Medicare IM     Sargun Rummell W, CMA 02/08/2024, 3:00 PM

## 2024-02-08 NOTE — Progress Notes (Signed)
 Brief Nutrition Note  Discussed with nutrition services department. Pt with poor oral intake and eating very little. Per nutrition services and RN, pt family upset that they are being given trays when pt is not eating. Verbal order obtained to only send trays when pt family calls for meal. RD also liberalized diet to regular diet for wider variety of meal selections and added Ensure Plus High Protein po BID, each supplement provides 350 kcal and 20 grams of protein.   Margery ORN, RD, LDN, CDCES Registered Dietitian III Certified Diabetes Care and Education Specialist If unable to reach this RD, please use RD Inpatient group chat on secure chat between hours of 8am-4 pm daily
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
## 2024-02-08 NOTE — Procedures (Signed)
 Patient Name: NAYELI CALVERT  MRN: [id number]  Epilepsy Attending: Arlin MALVA Krebs  Referring Physician/Provider: Matthews Elida HERO, MD  Date: 02/08/2024 Duration: 27.01 mins  Patient history: 68yo F with ams. EEG to evaluate for seizure.  Level of alertness:  comatose/ lethargic   AEDs during EEG study: None  Technical aspects: This EEG study was done with scalp electrodes positioned according to the 10-20 International system of electrode placement. Electrical activity was reviewed with band pass filter of 1-70Hz , sensitivity of 7 uV/mm, display speed of 39mm/sec with a 60Hz  notched filter applied as appropriate. EEG data were recorded continuously and digitally stored.  Video monitoring was available and reviewed as appropriate.  Description: EEG showed continuous generalized polymorphic sharply contoured 3 to 6 Hz theta-delta slowing. Generalized periodic discharges with triphasic morphology at 0.5-2 Hz were also noted Hyperventilation and photic stimulation were not performed.     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 No seizures or definite epileptiform discharges were seen throughout the recording.  Bronson Bressman O Dahiana Kulak

## 2024-02-09 ENCOUNTER — Inpatient Hospital Stay

## 2024-02-09 ENCOUNTER — Ambulatory Visit

## 2024-02-09 ENCOUNTER — Other Ambulatory Visit: Payer: Self-pay | Admitting: Oncology

## 2024-02-09 ENCOUNTER — Inpatient Hospital Stay: Admitting: Hospice and Palliative Medicine

## 2024-02-09 ENCOUNTER — Ambulatory Visit: Admitting: Oncology

## 2024-02-09 ENCOUNTER — Other Ambulatory Visit

## 2024-02-09 ENCOUNTER — Inpatient Hospital Stay: Admitting: Oncology

## 2024-02-09 DIAGNOSIS — C50911 Malignant neoplasm of unspecified site of right female breast: Secondary | ICD-10-CM | POA: Diagnosis not present

## 2024-02-09 DIAGNOSIS — J189 Pneumonia, unspecified organism: Secondary | ICD-10-CM | POA: Diagnosis not present

## 2024-02-09 DIAGNOSIS — Z515 Encounter for palliative care: Secondary | ICD-10-CM | POA: Diagnosis not present

## 2024-02-09 DIAGNOSIS — G9341 Metabolic encephalopathy: Secondary | ICD-10-CM | POA: Diagnosis not present

## 2024-02-09 LAB — CYTOLOGY - NON PAP

## 2024-02-09 LAB — CULTURE, BLOOD (ROUTINE X 2): Culture: NO GROWTH

## 2024-02-09 MED ORDER — MORPHINE SULFATE (PF) 2 MG/ML IV SOLN
2.0000 mg | INTRAVENOUS | Status: DC | PRN
  Administered 2024-02-09 (×2): 2 mg via INTRAVENOUS
  Filled 2024-02-09 (×2): qty 1

## 2024-02-09 MED ORDER — BIOTENE DRY MOUTH MT LIQD: 15.0000 mL | OROMUCOSAL | Status: DC | PRN

## 2024-02-09 MED ORDER — POLYVINYL ALCOHOL 1.4 % OP SOLN: 1.0000 [drp] | Freq: Four times a day (QID) | OPHTHALMIC | Status: DC | PRN

## 2024-02-09 MED ORDER — ONDANSETRON HCL 4 MG/2ML IJ SOLN
4.0000 mg | Freq: Four times a day (QID) | INTRAMUSCULAR | Status: DC | PRN
Start: 2024-02-09 — End: 2024-02-22

## 2024-02-09 MED ORDER — MORPHINE SULFATE (PF) 2 MG/ML IV SOLN
2.0000 mg | INTRAVENOUS | Status: DC | PRN
  Administered 2024-02-09: 2 mg via INTRAVENOUS
  Filled 2024-02-09: qty 1

## 2024-02-09 MED ORDER — GLYCOPYRROLATE 0.2 MG/ML IJ SOLN: 0.2000 mg | INTRAMUSCULAR | Status: DC | PRN

## 2024-02-09 MED ORDER — ACETAMINOPHEN 650 MG RE SUPP: 650.0000 mg | Freq: Four times a day (QID) | RECTAL | Status: DC | PRN

## 2024-02-09 MED ORDER — LORAZEPAM 2 MG/ML IJ SOLN: 0.5000 mg | INTRAMUSCULAR | Status: DC | PRN

## 2024-02-09 MED ORDER — GLYCOPYRROLATE 1 MG PO TABS
1.0000 mg | ORAL_TABLET | ORAL | Status: DC | PRN
  Filled 2024-02-09: qty 1

## 2024-02-09 MED ORDER — SODIUM CHLORIDE 0.9% FLUSH: 10.0000 mL | Freq: Two times a day (BID) | INTRAVENOUS | Status: DC

## 2024-02-09 MED ORDER — HALOPERIDOL LACTATE 5 MG/ML IJ SOLN: 0.5000 mg | INTRAMUSCULAR | Status: DC | PRN

## 2024-02-09 MED ORDER — MORPHINE SULFATE (PF) 2 MG/ML IV SOLN: 2.0000 mg | INTRAVENOUS | Status: DC | PRN

## 2024-02-09 MED ORDER — HALOPERIDOL LACTATE 2 MG/ML PO CONC
0.5000 mg | ORAL | Status: DC | PRN
  Filled 2024-02-09: qty 5

## 2024-02-09 MED ORDER — POLYVINYL ALCOHOL 1.4 % OP SOLN
1.0000 [drp] | Freq: Four times a day (QID) | OPHTHALMIC | Status: DC | PRN
  Filled 2024-02-09: qty 15

## 2024-02-09 MED ORDER — LORAZEPAM 2 MG/ML IJ SOLN
0.5000 mg | Freq: Four times a day (QID) | INTRAMUSCULAR | Status: DC | PRN
  Administered 2024-02-09: 0.5 mg via INTRAVENOUS
  Filled 2024-02-09: qty 1

## 2024-02-09 MED ORDER — CHLORHEXIDINE GLUCONATE CLOTH 2 % EX PADS: 6.0000 | MEDICATED_PAD | Freq: Every day | CUTANEOUS | Status: DC

## 2024-02-09 MED ORDER — HALOPERIDOL 0.5 MG PO TABS: 0.5000 mg | ORAL_TABLET | ORAL | Status: DC | PRN

## 2024-02-09 NOTE — Progress Notes (Signed)
 [facility] LIAISON NOTE  Received request from Dalia Fuse, Transitions of Care [facility]) for evaluation for hospice InPatient Unit.  (IPU). Spoke with patient's husband, Garrel, son, Caron and daughter, Jenkins  to initiate education related to hospice philosophy, services, and team approach to care. Patient/family verbalized understanding of information given. Per Dr. Marcello, hospice MD- patient is appropriate for GIP level of care for unmanaged symptom of pain.   [facility] RN to call report to the Hospice Home at [phone]  This RN will arrange EMS transportation when the hospital team is ready for discharge.  Set for 1500 pick up by [facility].  Please medicate the patient prior to EMS transport as needed for comfort during transport.  Please send signed and completed DNRwith patient at discharge.     [facility] information and contact numbers given to   Above information shared with Zenobia Frazier, [facility] and hospital medical care team.  Please call with any hospice related questions or concerns.  Thank you for the opportunity to participate in this patient's care.   Saddie HILARIO Na, MA, BSN, RN, FNE Nurse Liaison [phone]

## 2024-02-09 NOTE — Progress Notes (Signed)
 PT Cancellation Note  Patient Details Name: Debra [name] MRN: [id number] DOB: 01-Sep-1955   Cancelled Treatment:    Reason Eval/Treat Not Completed: Other (comment).  PT consult cancelled by MD.  No eval initiated.  Damien Caulk, PT 02/09/24, 10:45 AM

## 2024-02-09 NOTE — Progress Notes (Signed)
 Report called to [facility] with hospice.

## 2024-02-09 NOTE — Assessment & Plan Note (Signed)
 Patient made full comfort care measures this morning.  Patient is a DNR.  Patient will transfer to the hospice home today.  Morphine  every 30 minutes to try to control pain, as needed Ativan, as needed glycopyrrolate , as needed Haldol.

## 2024-02-09 NOTE — TOC Transition Note (Signed)
 Transition of Care [facility]) - Discharge Note   Patient Details  Name: Debra [name] MRN: [id number] Date of Birth: 02-Jul-1956  [facility] CM/SW Contact:  Dalia GORMAN Fuse, RN Phone Number: 02/09/2024, 2:43 PM   Clinical Narrative:     Patient is medically clear to dc to [facility] IPU. [facility] will transport. No other TOC needs identified.        Patient Goals and CMS Choice            Discharge Placement                       Discharge Plan and Services Additional resources added to the After Visit Summary for                                       Social Drivers of Health (SDOH) Interventions SDOH Screenings   Food Insecurity: No Food Insecurity (02/04/2024)  Housing: Low Risk  (02/04/2024)  Transportation Needs: No Transportation Needs (02/04/2024)  Utilities: Not At Risk (02/04/2024)  Financial Resource Strain: Low Risk  (06/15/2023)   Received from [facility]  Physical Activity: Unknown (12/17/2017)  Social Connections: Moderately Isolated (02/04/2024)  Stress: No Stress Concern Present (12/17/2017)  Tobacco Use: Medium Risk (02/04/2024)  Health Literacy: Adequate Health Literacy (12/16/2023)     Readmission Risk Interventions     No data to display

## 2024-02-09 NOTE — Plan of Care (Signed)
   Problem: Education: Goal: Knowledge of General Education information will improve Description Including pain rating scale, medication(s)/side effects and non-pharmacologic comfort measures Outcome: Progressing

## 2024-02-09 NOTE — Discharge Summary (Signed)
 Physician Discharge Summary   Patient: Debra [name] MRN: [id number] DOB: 01-26-1956  Admit date:     02/04/2024  Discharge date: 02/09/24  Discharge Physician: Charlie Patterson   PCP: Sadie Manna, MD   Recommendations at discharge:   Discharge to hospice Home  Discharge Diagnoses: Principal Problem:   End of life care Active Problems:   Breast cancer metastasized to multiple sites [facility])   Acute metabolic encephalopathy   Recurrent pneumonia   Pleural effusion, right   History of CVA (cerebrovascular accident)   Paroxysmal atrial fibrillation (HCC)   Chronic constipation   Chronic pain   Anemia of chronic disease   Nausea   Acute on chronic respiratory failure with hypoxia (HCC)   Metastatic carcinoma (HCC)   Pneumonia due to infectious organism    Hospital Course: 68 y.o. female with medical history significant for HTN, metastatic breast cancer to lung bone and liver, history of prior CVA, chronic pain secondary to malignancy, paroxysmal A-fib on lovenox ,  COPD chronically on 2 L recently admitted from 6/11 - 01/29/24 for CAP/postobstructive pneumonia, being readmitted for ongoing symptoms of pneumonia with increased O2 requirement to 6 L.  Since her discharge, she has felt increasingly weak along with shortness of breath.  Has a continued cough.  Denies fever or chest pain. With EMS, O2 sats in the high 80s to low 90s on 2 L.  Upon arrival she was titrated to up to 6 L.  Tachycardic to the 120s, afebrile, BP 122/37.  Labs notable for WBC 22,000 up from 9.8 at discharge 6 days ago.  Hemoglobin at baseline at 9.6.  Lactic acid 1.7.  INR and CMP unremarkable.  Chest x-ray showed increased filling severity of the right basilar atelectasis/infiltrate compared to prior study as well as increased size moderate right sided pleural effusion. Patient started on aztreonam  and vancomycin .  Admission requested   6/20.  With MRSA PCR being negative vancomycin  discontinued and added  doxycycline .  Patient on Maxipime .  Thoracentesis removed 1.1 L of fluid. 6/21.  Patient nauseous with vomiting this morning.  Having some back pain.  White blood cell count down to 15.1.  Hemoglobin 8.1. 6/22.  Called by nursing staff to see patient secondary to altered mental status.  Patient was able to hold her arms up in the chair but drifting.  Patient difficult to understand.  Case discussed with neurology and stat CTA and CT venogram were negative for bleed or stroke but did show metastatic disease with lytic lesions on the bone and a C6 fracture pathological fracture.  MRI cervical spine and head with and without contrast ordered. 6/23.  Patient did not hold still for MRI with contrast.  Interventional radiology nervous about doing a LP if you want to hold still.  Palliative care and oncology did speak with patient and family and they decided to move towards hospice.  We kept antibiotics and fluids overnight. 6/24.  Patient made full comfort care measures.  Appreciate evaluation by hospice liaison.  Patient accepted to hospice at home and hopefully will be transferred today.  Morphine  every 30 minutes, 2 milligrams to try to control pain.    Assessment and Plan: * End of life care Patient made full comfort care measures this morning.  Patient is a DNR.  Patient will transfer to the hospice home today.  Morphine  every 30 minutes to try to control pain, as needed Ativan, as needed glycopyrrolate , as needed Haldol.  Breast cancer metastasized to multiple sites [facility]) Appreciate palliative  care team following.  Metastases to liver, bone and lung.   CT scan showing metastases lytic lesions to the bone and also C6 cervical spine.  Concern for leptomeningeal spread with her altered mental status.  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  Acute metabolic encephalopathy Mental status much different 6/22 than prior.  Patient not eating or wanting to take  medication.  Stat CT a of the head and neck and CT venogram were negative for stroke or bleed.  Did show lytic lesions of the bone consistent with metastatic disease and a C6 fracture pathologic.  No large vessel occlusion 2 mm cavernous ICA aneurysm.  MRI of the brain and cervical spine with and without contrast ordered.  MRI without contrast was done only which showed metastatic lesions to the bone.  Lumbar puncture also ordered but unable to be done secondary to patient moving around.  Patient placed on empiric antibiotics for meningitis including acyclovir, ampicillin , Rocephin  and vancomycin .  Since patient made comfort care measures on 6/24 all antibiotics and procedures were canceled.  Pleural effusion, right Thoracentesis removed 1.1 L.  Could be secondary to history of cancer or pneumonia.  So far no growth in culture.  Cytology showing metastatic adenocarcinoma.  Recurrent pneumonia Failed outpatient treatment.  Started on Maxipime  and added doxycycline  on admission.  Vancomycin  discontinued secondary to MRSA PCR being negative.  With 2 out of 4 blood cultures positive for Staphylococcus epidermidis, this still could be a contamination but with her history, I will be more aggressive with antibiotics and restart vancomycin  (on 6/21) and discontinue doxycycline .  So far repeat blood cultures are negative for 2 days.  Antibiotics changed on the afternoon on 6/22 to Rocephin , ampicillin  and vancomycin .  All antibiotics discontinued on 6/24.  History of CVA (cerebrovascular accident) Holding Lovenox  injections since patient is now comfort care  Paroxysmal atrial fibrillation (HCC) Holding Lovenox  injections since patient is comfort care.  Patient went into rapid atrial fibrillation 2 nights ago.  Acute on chronic respiratory failure with hypoxia (HCC) EMS documented sats in the 80s on 2 L and was increased to 6 L by EMS.  Chronically on 2 L.  Nausea As needed Zofran  and also as needed  Phenergan  for breakthrough nausea.  Anemia of chronic disease Last hemoglobin down to 7.6.  No further blood draws since patient is comfort care.  Chronic pain Changed oral medications over to IV morphine  every 30 minutes 2 mg.  Chronic constipation Patient had documented bowel movement today         Consultants: Palliative care, oncology, neurology, hospice, interventional radiology Procedures performed: Thoracentesis Disposition: Hospice Home Diet recommendation:  Patient has not wanted anything to eat the last few days.  But can have what ever she wants if she chooses to eat.  Regular diet with thin liquids. DISCHARGE MEDICATION: Allergies as of 02/09/2024       Reactions   Cephalexin  Hives   Strawberry Extract Hives        Medication List     STOP taking these medications    acetaminophen  500 MG tablet Commonly known as: TYLENOL  Replaced by: acetaminophen  650 MG suppository   alendronate  70 MG tablet Commonly known as: FOSAMAX    atorvastatin  80 MG tablet Commonly known as: LIPITOR    citalopram  10 MG tablet Commonly known as: CELEXA    dexamethasone  1 MG tablet Commonly known as: DECADRON    diltiazem  120 MG 24 hr capsule Commonly known as: CARDIZEM  CD   enoxaparin   80 MG/0.8ML injection Commonly known as: LOVENOX    feeding supplement Liqd   magnesium  chloride 64 MG Tbec SR tablet Commonly known as: SLOW-MAG   Melatonin 5 MG Caps   morphine  15 MG 12 hr tablet Commonly known as: MS CONTIN  Replaced by: morphine  (PF) 2 MG/ML injection   ondansetron  4 MG disintegrating tablet Commonly known as: ZOFRAN -ODT   Oxycodone  HCl 10 MG Tabs   pantoprazole  40 MG tablet Commonly known as: PROTONIX    potassium chloride  10 MEQ tablet Commonly known as: KLOR-CON  M   pregabalin  150 MG capsule Commonly known as: LYRICA    promethazine  25 MG tablet Commonly known as: PHENERGAN    roflumilast  500 MCG Tabs tablet Commonly known as: DALIRESP     sulfamethoxazole -trimethoprim  800-160 MG tablet Commonly known as: BACTRIM  DS   Trelegy Ellipta 100-62.5-25 MCG/INH Aepb Generic drug: Fluticasone -Umeclidin-Vilant       TAKE these medications    acetaminophen  650 MG suppository Commonly known as: TYLENOL  Place 1 suppository (650 mg total) rectally every 6 (six) hours as needed for mild pain (pain score 1-3) or fever (or Fever >/= 101). Replaces: acetaminophen  500 MG tablet   antiseptic oral rinse Liqd Apply 15 mLs topically as needed for dry mouth.   artificial tears ophthalmic solution Place 1 drop into both eyes 4 (four) times daily as needed for dry eyes.   Chlorhexidine  Gluconate Cloth 2 % Pads Apply 6 each topically daily. Start taking on: February 10, 2024   glycopyrrolate  0.2 MG/ML injection Commonly known as: ROBINUL  Inject 1 mL (0.2 mg total) into the vein every 4 (four) hours as needed (excessive secretions).   haloperidol lactate 5 MG/ML injection Commonly known as: HALDOL Inject 0.1 mLs (0.5 mg total) into the vein every 4 (four) hours as needed (or delirium).   LORazepam 2 MG/ML injection Commonly known as: ATIVAN Inject 0.25 mLs (0.5 mg total) into the vein every 4 (four) hours as needed (agitation, anxiety).   morphine  (PF) 2 MG/ML injection Inject 1 mL (2 mg total) into the vein every 30 (thirty) minutes as needed (breakthru pain). Replaces: morphine  15 MG 12 hr tablet   ondansetron  4 MG/2ML Soln injection Commonly known as: ZOFRAN  Inject 2 mLs (4 mg total) into the vein every 6 (six) hours as needed for nausea.   sodium chloride  flush 0.9 % Soln Commonly known as: NS 10-40 mLs by Intracatheter route every 12 (twelve) hours.        Follow-up Information     Sadie Manna, MD Follow up.   Specialty: Internal Medicine Why: Hospital follow up Contact information: [address] [place] [address] [phone]                Discharge Exam: Fredricka  Weights   02/04/24 1739 02/04/24 2124  Weight: 77.1 kg 76 kg   Physical Exam HENT:     Head: Normocephalic.   Eyes:     General: Lids are normal.    Cardiovascular:     Rate and Rhythm: Regular rhythm. Tachycardia present.     Heart sounds: Normal heart sounds, S1 normal and S2 normal.  Pulmonary:     Breath sounds: Examination of the right-lower field reveals decreased breath sounds and rhonchi. Decreased breath sounds and rhonchi present. No wheezing or rales.  Abdominal:     Palpations: Abdomen is soft.     Tenderness: There is abdominal tenderness in the left lower quadrant.   Musculoskeletal:     Right lower leg: No swelling.  Left lower leg: No swelling.   Skin:    General: Skin is warm.     Findings: No rash.   Neurological:     Mental Status: She is lethargic.     Comments: Patient answered a few simple questions.     Condition at discharge: Guarded  The results of significant diagnostics from this hospitalization (including imaging, microbiology, ancillary and laboratory) are listed below for reference.   Imaging Studies: EEG adult Result Date: 02/08/2024 Shelton Arlin KIDD, MD     02/08/2024  3:13 PM Patient Name: ADELYNA BROCKMAN MRN: [id number] Epilepsy Attending: Arlin KIDD Shelton Referring Physician/Provider: Matthews Elida HERO, MD Date: 02/08/2024 Duration: 27.01 mins Patient history: 68yo F with ams. EEG to evaluate for seizure. Level of alertness:  comatose/ lethargic AEDs during EEG study: None Technical aspects: This EEG study was done with scalp electrodes positioned according to the 10-20 International system of electrode placement. Electrical activity was reviewed with band pass filter of 1-70Hz , sensitivity of 7 uV/mm, display speed of 59mm/sec with a 60Hz  notched filter applied as appropriate. EEG data were recorded continuously and digitally stored.  Video monitoring was available and reviewed as appropriate. Description: EEG showed continuous generalized  polymorphic sharply contoured 3 to 6 Hz theta-delta slowing. Generalized periodic discharges with triphasic morphology at 0.5-2 Hz were also noted Hyperventilation and photic stimulation were not performed.   ABNORMALITY - Periodic discharges with triphasic morphology, generalized ( GPDs) - Continuous slow, generalized IMPRESSION: This study is suggestive of moderate to severe diffuse encephalopathy likely related to toxic-metabolic causes. No seizures or definite epileptiform discharges were seen throughout the recording. Arlin KIDD Shelton   MR CERVICAL SPINE WO CONTRAST Result Date: 02/07/2024 EXAM: MRI CERVICAL SPINE WITHOUT CONTRAST 02/07/2024 12:10:00 PM TECHNIQUE: Multiplanar multisequence MRI of the cervical spine was attempted. However, axial images were not completed as the patient continued to attempt to get up throughout the exam. COMPARISON: MRI of the cervical spine 10/26/2008. CT angio head and neck 02/07/2024. CLINICAL HISTORY: Metastatic disease evaluation. End stage cancer with excessive N/V AMS; Pt unable to make it to contrasted portion of exam due to pt attempting to get up even with meds. FINDINGS: BONES AND ALIGNMENT: Diffuse marrow signal changes are present throughout the cervical and upper thoracic spine. Tumor completely infiltrates the C5 and C6 vertebral bodies. The STIR sequence is markedly distorted by patient motion. The C6 pathologic compression fracture is new since the CTA 11/11/2023. No other pathologic fractures are present. Retrolisthesis at C3-4 is new. SPINAL CORD: T2 signal changes are suggested in the cervical spine at C4 and C5. SOFT TISSUES: No paraspinal mass. C2-C3: No significant disc herniation. No spinal canal stenosis or neural foraminal narrowing. C3-C4: No significant disc herniation. Moderate central canal stenosis at C3-4 is new. Moderate foraminal stenosis is present bilaterally at C3-4. C4-C5: No significant disc herniation. Moderate foraminal stenosis is  present bilaterally at C4-5. C5-C6: Pathologic compression fracture of C6 vertebral body. Findings are new since the CTA 11/11/2023. C6-C7: No significant disc herniation. No spinal canal stenosis or neural foraminal narrowing. C7-T1: No significant disc herniation. No spinal canal stenosis or neural foraminal narrowing. IMPRESSION: 1. Diffuse marrow signal changes throughout the cervical and upper thoracic spine is consistent with widespread osseous metastases. Diffuse tumor infiltration is noted in the C5 and C6 vertebral bodies and a new C6 pathologic compression fracture since the CTA 11/11/2023. No other pathologic fractures are present. 2. New retrolisthesis at C3-4 with moderate central canal stenosis at C3-4  and moderate foraminal stenosis bilaterally at C3-4 and C4-5. T2 signal changes below this level suggest edema 3. The study is moderately degraded by patient motion. The axial images could not be completed. Electronically signed by: Lonni Necessary MD 02/07/2024 12:30 PM EDT RP Workstation: [id number]   MR BRAIN WO CONTRAST Result Date: 02/07/2024 EXAM: MRI BRAIN WITHOUT CONTRAST 02/07/2024 12:10:00 PM TECHNIQUE: Multiplanar multisequence MRI of the head/brain was performed without the administration of intravenous contrast. COMPARISON: MR head without contrast 09/12/2023. CT angio head and neck 02/07/2024. CLINICAL HISTORY: Mental status change, unknown cause; hx metastatic cancer and stroke. End stage cancer with excessive N/V AMS; Pt unable to make it to contrasted portion of exam due to pt attempting to get up even with meds. ; Unable to to obtained sag stir/ AX T1/ medic of c-spine. FINDINGS: BRAIN AND VENTRICLES: Expected evolution of previously seen acute infarcts in the posterior left corona radiata, left frontal, and left parietal lobe are noted. The study is moderately degraded by patient motion. No intracranial hemorrhage. No mass. No midline shift. No hydrocephalus. The sella is  unremarkable. Normal flow voids. ORBITS: Bilateral lens replacements are noted. The globes and orbits are otherwise within normal limits. SINUSES AND MASTOIDS: No acute abnormality. BONES AND SOFT TISSUES: Multiple foci of T1 signal loss within the marrow-containing spaces are consistent with diffuse metastatic disease. No lesion at C2 is increasing in size. A 7 mm lesion within the clivus is new. No pathologic fractures are present. Scattered metastases are present throughout the skull. IMPRESSION: 1. Expected evolution of previously seen acute infarcts in the posterior left corona radiata, left frontal, and left parietal lobe. 2. Diffuse metastatic disease involving the marrow-containing spaces, with a new 7 mm lesion within the clivus and scattered metastases throughout the skull. No pathologic fractures. Electronically signed by: Lonni Necessary MD 02/07/2024 12:24 PM EDT RP Workstation: [id number]   CT VENOGRAM HEAD Result Date: 02/07/2024 EXAM: CT VENOGRAM WITH CONTAST 02/07/2024 09:30:19 AM TECHNIQUE: CT venogram of the head/brain was performed with the administration of 75mL iohexol  (OMNIPAQUE ) 350 MG/ML injection. Multiplanar reformatted images are provided for review. MIP images are provided for review. Automated exposure control, iterative reconstruction, and/or weight based adjustment of the mA/kV was utilized to reduce the radiation dose to as low as reasonably achievable. COMPARISON: MRI head without contrast dated 09/12/2023. CT angiography of the head and neck dated 11/11/2023. CLINICAL HISTORY: Hypercoaguable, altered mental status. Neuro deficit, acute, stroke suspected. FINDINGS: Dominant right transverse sinus is present. Hypoplastic left transverse and sigmoid sinuses is noted. No dural venous sinus thrombosis. No significant stenosis. IMPRESSION: 1. No dural venous sinus thrombosis. 2. Dominant right transverse sinus and hypoplastic left transverse and sigmoid sinuses. Electronically  signed by: Lonni Necessary MD 02/07/2024 10:02 AM EDT RP Workstation: [id number]   CT ANGIO HEAD NECK W WO CM Result Date: 02/07/2024 EXAM: CTA HEAD AND NECK WITH AND WITHOUT 02/07/2024 09:30:19 AM TECHNIQUE: CTA of the head and neck was performed with and without the administration of intravenous contrast. Multiplanar 2D and/or 3D reformatted images are provided for review. Automated exposure control, iterative reconstruction, and/or weight based adjustment of the mA/kV was utilized to reduce the radiation dose to as low as reasonably achievable. Stenosis of the internal carotid arteries measured using NASCET criteria. COMPARISON: MR head without contrast 11/17/2023. CT head without contrast 11/17/2023. CT of the head and neck 11/11/2023. CLINICAL HISTORY: Neuro deficit, acute, stroke suspected. Breast cancer with metastatic disease. FINDINGS: CTA NECK: AORTIC ARCH AND ARCH VESSELS:  Atherosclerotic calcifications at the aortic arch are stable. The great vessel origins are widely patent. CERVICAL CAROTID ARTERIES: Mild atherosclerotic changes are present at the origin of the right common carotid artery without focal stenosis. Proximal right ICA without significant stenosis. Atherosclerotic changes are present in the distal left common carotid artery and the carotid bifurcation without focal stenosis. CERVICAL VERTEBRAL ARTERIES: No dissection, arterial injury, or significant stenosis. LUNGS AND MEDIASTINUM: Unremarkable. SOFT TISSUES: A thyroid  nodule is again noted. BONES: Multilevel degenerative changes are again noted within the cervical spine. Interval pathologic fracture is noted along the superior endplate of C6. Multiple small lytic lesions are present throughout the visualized skeleton compatible with widespread metastatic disease. No other fractures are present. CTA HEAD: ANTERIOR CIRCULATION: Atherosclerotic calcifications are present within the cavernous internal carotid arteries bilaterally  without focal stenosis. 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 POSTERIOR CIRCULATION: No significant stenosis of the posterior cerebral arteries. No significant stenosis of the basilar artery. No significant stenosis of the vertebral arteries. No aneurysm. OTHER: No dural venous sinus thrombosis on this non-dedicated study. Periventricular white matter disease is again noted. 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 No acute infarct or hemorrhage is present. No mass lesion is present. Chronic inferior right mastoid effusion is present. IMPRESSION: 1. Multiple small lytic lesions throughout the visualized skeleton compatible with widespread metastatic disease. 2. Interval pathologic fracture along the superior endplate of C6. 3. No large vessel occlusion or hemodynamically significant stenosis in the head or neck. 4. Stable atherosclerotic calcifications at the aortic arch and cavernous internal carotid arteries bilaterally without focal stenosis. 5. 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 Electronically signed by: Lonni Necessary MD 02/07/2024 09:58 AM EDT RP Workstation: [id number]   DG Chest Port 1 View Result Date: 02/05/2024 CLINICAL DATA:  Pleural effusion. History of metastatic breast cancer EXAM: PORTABLE CHEST 1 VIEW COMPARISON:  X-ray 02/04/2024 FINDINGS: Right IJ chest port in place with tip along the central SVC right atrial junction. Normal cardiopericardial silhouette. Calcified aorta. Trace right-sided pleural effusion identified. Significantly decreased from previous. Prior thoracentesis. No pneumothorax. Mild right lung base opacity. No edema. No left-sided consolidation. Overlapping cardiac leads. IMPRESSION: Significantly improved right pleural effusion with trace residual. Mild right lung base opacity. No pneumothorax seen. Electronically Signed   By: Ranell Bring M.D.   On: 02/05/2024 14:55   US   THORACENTESIS ASP PLEURAL SPACE W/IMG GUIDE Result Date: 02/05/2024 INDICATION: 68 year old female with a history of lung cancer, currently with pneumonia, and right-sided pleural effusion. IR was requested for diagnostic and therapeutic right sided thoracentesis. EXAM: ULTRASOUND GUIDED DIAGNOSTIC AND THERAPEUTIC THORACENTESIS MEDICATIONS: 8 cc of 1% lidocaine  COMPLICATIONS: None immediate. PROCEDURE: An ultrasound guided thoracentesis was thoroughly discussed with the patient and questions answered. The benefits, risks, alternatives and complications were also discussed. The patient understands and wishes to proceed with the procedure. Written consent was obtained. Ultrasound was performed to localize and mark an adequate pocket of fluid in the right chest. The area was then prepped and draped in the normal sterile fashion. 1% Lidocaine  was used for local anesthesia. Under ultrasound guidance a 6 Fr Safe-T-Centesis catheter was introduced. Thoracentesis was performed. The catheter was removed and a dressing applied. FINDINGS: A total of approximately 1.1 L of clear, straw-colored pleural fluid was removed. Samples were sent to the laboratory as requested by the clinical team. IMPRESSION: Successful ultrasound guided right thoracentesis yielding 1.1 L of pleural  fluid. Procedure performed by Carlin Griffon, PA-C Electronically Signed   By: Marcey Moan M.D.   On: 02/05/2024 12:15   DG Chest 1 View Result Date: 02/04/2024 CLINICAL DATA:  Weakness and shortness of breath. EXAM: CHEST  1 VIEW COMPARISON:  January 27, 2024 FINDINGS: There is stable right-sided venous Port-A-Cath positioning. The heart size and mediastinal contours are within normal limits. There is marked severity right basilar atelectasis and/or infiltrate which is increased in severity when compared to the prior study. Mild to moderate severity similar appearing changes are seen within the mid right lung. A moderate size right-sided pleural  effusion is also noted. This is also increased in size. No pneumothorax is identified. Multilevel degenerative changes are seen throughout the thoracic spine. IMPRESSION: 1. Marked severity right basilar atelectasis and/or infiltrate, increased in severity when compared to the prior study. 2. Moderate sized right-sided pleural effusion, also increased in size. Electronically Signed   By: Suzen Dials M.D.   On: 02/04/2024 18:35   DG Chest Port 1 View Result Date: 01/27/2024 CLINICAL DATA:  Sepsis EXAM: PORTABLE CHEST 1 VIEW COMPARISON:  November 17, 2023 FINDINGS: Right IJ Infusaport catheter in good position. Right lower lobe consolidative pulmonary infiltrates and atelectasis with a small effusion could correlate with pneumonia. IMPRESSION: Right lower lobe pneumonia. Electronically Signed   By: Franky Chard M.D.   On: 01/27/2024 10:51    Microbiology: Results for orders placed or performed during the hospital encounter of 02/04/24  Culture, blood (Routine x 2)     Status: None   Collection Time: 02/04/24  5:48 PM   Specimen: BLOOD  Result Value Ref Range Status   Specimen Description BLOOD BLOOD LEFT HAND  Final   Special Requests   Final    BOTTLES DRAWN AEROBIC AND ANAEROBIC Blood Culture results may not be optimal due to an inadequate volume of blood received in culture bottles   Culture   Final    NO GROWTH 5 DAYS Performed at [facility], [address] [place], [place] [address]    Report Status 02/09/2024 FINAL  Final  Culture, blood (Routine x 2)     Status: Abnormal   Collection Time: 02/04/24  6:01 PM   Specimen: BLOOD RIGHT HAND  Result Value Ref Range Status   Specimen Description   Final    BLOOD RIGHT HAND Performed at [facility], [address] [place], [place] [address]    Special Requests   Final    BOTTLES DRAWN AEROBIC AND ANAEROBIC Blood Culture adequate volume Performed at [facility], [address] [place], [place] [address]     Culture  Setup Time   Final    AEROBIC BOTTLE ONLY GRAM POSITIVE COCCI CRITICAL RESULT CALLED TO, READ BACK BY AND VERIFIED WITH: WILL ANDERSON @ 02/05/24 1951 AB    Culture (A)  Final    STAPHYLOCOCCUS EPIDERMIDIS THE SIGNIFICANCE OF ISOLATING THIS ORGANISM FROM A SINGLE SET OF BLOOD CULTURES WHEN MULTIPLE SETS ARE DRAWN IS UNCERTAIN. PLEASE NOTIFY THE MICROBIOLOGY DEPARTMENT WITHIN ONE WEEK IF SPECIATION AND SENSITIVITIES ARE REQUIRED. Performed at [facility], [address], [place], [place] [address]    Report Status 02/08/2024 FINAL  Final  Blood Culture ID Panel (Reflexed)     Status: Abnormal   Collection Time: 02/04/24  6:01 PM  Result Value Ref Range Status   Enterococcus faecalis NOT DETECTED NOT DETECTED Final   Enterococcus Faecium NOT DETECTED NOT DETECTED Final  Listeria monocytogenes NOT DETECTED NOT DETECTED Final   Staphylococcus species DETECTED (A) NOT DETECTED Final    Comment: CRITICAL RESULT CALLED TO, READ BACK BY AND VERIFIED WITH: WILL ANDERSON @ 02/05/24 1951 AB    Staphylococcus aureus (BCID) NOT DETECTED NOT DETECTED Final   Staphylococcus epidermidis DETECTED (A) NOT DETECTED Final    Comment: Methicillin (oxacillin) resistant coagulase negative staphylococcus. Possible blood culture contaminant (unless isolated from more than one blood culture draw or clinical case suggests pathogenicity). No antibiotic treatment is indicated for blood  culture contaminants. CRITICAL RESULT CALLED TO, READ BACK BY AND VERIFIED WITH: WILL ANDERSON @ 02/05/24 1951 AB    Staphylococcus lugdunensis NOT DETECTED NOT DETECTED Final   Streptococcus species NOT DETECTED NOT DETECTED Final   Streptococcus agalactiae NOT DETECTED NOT DETECTED Final   Streptococcus pneumoniae NOT DETECTED NOT DETECTED Final   Streptococcus pyogenes NOT DETECTED NOT DETECTED Final   A.calcoaceticus-baumannii NOT DETECTED NOT DETECTED Final   Bacteroides fragilis NOT DETECTED NOT  DETECTED Final   Enterobacterales NOT DETECTED NOT DETECTED Final   Enterobacter cloacae complex NOT DETECTED NOT DETECTED Final   Escherichia coli NOT DETECTED NOT DETECTED Final   Klebsiella aerogenes NOT DETECTED NOT DETECTED Final   Klebsiella oxytoca NOT DETECTED NOT DETECTED Final   Klebsiella pneumoniae NOT DETECTED NOT DETECTED Final   Proteus species NOT DETECTED NOT DETECTED Final   Salmonella species NOT DETECTED NOT DETECTED Final   Serratia marcescens NOT DETECTED NOT DETECTED Final   Haemophilus influenzae NOT DETECTED NOT DETECTED Final   Neisseria meningitidis NOT DETECTED NOT DETECTED Final   Pseudomonas aeruginosa NOT DETECTED NOT DETECTED Final   Stenotrophomonas maltophilia NOT DETECTED NOT DETECTED Final   Candida albicans NOT DETECTED NOT DETECTED Final   Candida auris NOT DETECTED NOT DETECTED Final   Candida glabrata NOT DETECTED NOT DETECTED Final   Candida krusei NOT DETECTED NOT DETECTED Final   Candida parapsilosis NOT DETECTED NOT DETECTED Final   Candida tropicalis NOT DETECTED NOT DETECTED Final   Cryptococcus neoformans/gattii NOT DETECTED NOT DETECTED Final   Methicillin resistance mecA/C DETECTED (A) NOT DETECTED Final    Comment: CRITICAL RESULT CALLED TO, READ BACK BY AND VERIFIED WITH: WILL ANDERSON @ 02/05/24 1951 AB Performed at [facility], [address] [place], [place] [address]   MRSA Next Gen by PCR, Nasal     Status: None   Collection Time: 02/05/24 12:25 AM   Specimen: Nasal Mucosa; Nasal Swab  Result Value Ref Range Status   MRSA by PCR Next Gen NOT DETECTED NOT DETECTED Final    Comment: (NOTE) The GeneXpert MRSA Assay (FDA approved for NASAL specimens only), is one component of a comprehensive MRSA colonization surveillance program. It is not intended to diagnose MRSA infection nor to guide or monitor treatment for MRSA infections. Test performance is not FDA approved in patients less than 30 years old. Performed at  [facility], [address] [place], [place] [address]   Body fluid culture w Gram Stain     Status: None   Collection Time: 02/05/24 11:52 AM   Specimen: PATH Cytology Pleural fluid  Result Value Ref Range Status   Specimen Description   Final    FLUID Performed at [facility], [address], [place], [place] [address]    Special Requests   Final     CYTO PLEU Performed at [facility], [address] [place], [place] [address]    Gram Stain  Final    FEW WBC PRESENT,BOTH PMN AND MONONUCLEAR NO ORGANISMS SEEN    Culture   Final    NO GROWTH 3 DAYS Performed at [facility], [address] [place], [place] [address]    Report Status 02/08/2024 FINAL  Final  Culture, blood (Routine X 2) w Reflex to ID Panel     Status: None (Preliminary result)   Collection Time: 02/07/24  8:29 AM   Specimen: BLOOD  Result Value Ref Range Status   Specimen Description BLOOD BLOOD LEFT HAND  Final   Special Requests   Final    BOTTLES DRAWN AEROBIC AND ANAEROBIC Blood Culture adequate volume   Culture   Final    NO GROWTH 2 DAYS Performed at [facility], [address] [place], [place] [address]    Report Status PENDING  Incomplete  Culture, blood (Routine X 2) w Reflex to ID Panel     Status: None (Preliminary result)   Collection Time: 02/07/24  8:29 AM   Specimen: BLOOD  Result Value Ref Range Status   Specimen Description BLOOD BLOOD RIGHT HAND  Final   Special Requests   Final    BOTTLES DRAWN AEROBIC AND ANAEROBIC Blood Culture adequate volume   Culture   Final    NO GROWTH 2 DAYS Performed at [facility], [address] [place], [place] [address]    Report Status PENDING  Incomplete    Labs: CBC: Recent Labs  Lab 02/04/24 1743 02/06/24 0942 02/06/24 1039 02/07/24 0811 02/08/24 0730  WBC 22.3*  --  15.1* 13.1* 15.5*  NEUTROABS 18.1*  --   --   --   --   HGB 9.6* 7.9* 8.1* 7.9* 7.6*  HCT 30.3* 25.0*  25.9* 25.5* 24.2*  MCV 94.7 95.1 94.2 95.9 93.4  PLT 407* 280 323 275 270   Basic Metabolic Panel: Recent Labs  Lab 02/04/24 1743 02/06/24 0942 02/07/24 0811 02/07/24 0945 02/08/24 0730  NA 132* 136  --  136 135  K 5.0 4.6  --  4.4 3.4*  CL 93* 100  --  100 103  CO2 24 23  --  26 24  GLUCOSE 137* 77  --  75 123*  BUN 12 22  --  21 15  CREATININE 0.78 0.77 0.77 0.76 0.63  CALCIUM  9.3 9.0  --  9.2 8.8*   Liver Function Tests: Recent Labs  Lab 02/04/24 1743  AST 38  ALT 12  ALKPHOS 255*  BILITOT 0.8  PROT 6.9  ALBUMIN 2.4*   CBG: Recent Labs  Lab 02/07/24 1038  GLUCAP 72    Discharge time spent: greater than 30 minutes. Case discussed with hospice liaison, nursing staff and palliative care  Signed: Charlie Patterson, MD [facility] 02/09/2024

## 2024-02-10 ENCOUNTER — Ambulatory Visit

## 2024-02-11 ENCOUNTER — Ambulatory Visit

## 2024-02-11 NOTE — Progress Notes (Signed)
 After multiple attempts, we have been unable to reach this patient to enroll in services.  Last attempt made on 02/09/2024.  [facility] remains available should the patient express interest in the future.

## 2024-02-11 NOTE — Radiation Completion Notes (Signed)
 Patient Name: Debra [name], Debra [name] MRN: [id number] Date of Birth: 1955/09/26 Referring Physician: TAMRA LEVENTHAL, M.D. Date of Service: 2024-02-11 Radiation Oncologist: Marcey Penton, M.D. 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  She received whole breast radiation.  She is gone on develop widespread metastatic disease.  By PET/CT criteria.  She has mediastinal right hilar nodes extensive osseous metastatic disease multiple liver metastasis and hypermetabolic left adrenal nodule she is complaining of significant lower back pain.  She is currently on narcotic analgesics.      ==========DELIVERED PLANS==========  First Treatment Date: 2021-09-09 Last Treatment Date: 2021-11-05   Plan Name: Breast_R Site: Breast, Right Technique: 3D Mode: Photon Dose Per Fraction: 1.8 Gy Prescribed Dose (Delivered / Prescribed): 50.4 Gy / 50.4 Gy Prescribed Fxs (Delivered / Prescribed): 28 / 28   Plan Name: Breast_R_Bst Site: Breast, Right Technique: Electron Mode: Electron Dose Per Fraction: 2 Gy Prescribed Dose (Delivered / Prescribed): 10 Gy / 10 Gy Prescribed Fxs (Delivered / Prescribed): 5 / 5     ==========ON TREATMENT VISIT DATES========== 2021-09-10, 2021-09-17, 2021-09-24, 2021-10-01, 2021-10-08, 2021-10-15, 2021-10-24, 2021-10-29,  2021-11-05     ==========UPCOMING VISITS==========       ==========APPENDIX - ON TREATMENT VISIT NOTES==========   See weekly On Treatment Notes in Epic for details in the Media tab (listed as Progress notes on the On Treatment Visit Dates listed above).

## 2024-02-12 ENCOUNTER — Ambulatory Visit

## 2024-02-12 LAB — CULTURE, BLOOD (ROUTINE X 2)
Culture: NO GROWTH
Culture: NO GROWTH
Special Requests: ADEQUATE
Special Requests: ADEQUATE

## 2024-02-15 ENCOUNTER — Ambulatory Visit

## 2024-02-16 ENCOUNTER — Ambulatory Visit: Admitting: Oncology

## 2024-02-16 ENCOUNTER — Ambulatory Visit

## 2024-02-16 ENCOUNTER — Other Ambulatory Visit

## 2024-02-17 ENCOUNTER — Ambulatory Visit

## 2024-02-18 ENCOUNTER — Ambulatory Visit

## 2024-02-22 ENCOUNTER — Ambulatory Visit

## 2024-02-22 ENCOUNTER — Ambulatory Visit: Admitting: Student

## 2024-02-23 ENCOUNTER — Other Ambulatory Visit

## 2024-02-23 ENCOUNTER — Ambulatory Visit

## 2024-02-23 ENCOUNTER — Ambulatory Visit: Admitting: Oncology

## 2024-02-24 ENCOUNTER — Ambulatory Visit

## 2024-03-08 ENCOUNTER — Ambulatory Visit: Admitting: Oncology

## 2024-03-08 ENCOUNTER — Other Ambulatory Visit

## 2024-03-08 ENCOUNTER — Ambulatory Visit

## 2024-03-18 DEATH — deceased
